# Patient Record
Sex: Female | Born: 1945 | Race: White | Hispanic: No | Marital: Single | Smoking: Former smoker
Health system: Southern US, Community
[De-identification: ages and names within clinical notes are randomized; demographics above are authoritative.]

## PROBLEM LIST (undated history)

## (undated) DIAGNOSIS — I519 Heart disease, unspecified: Secondary | ICD-10-CM

## (undated) DIAGNOSIS — Z9981 Dependence on supplemental oxygen: Secondary | ICD-10-CM

## (undated) DIAGNOSIS — I10 Essential (primary) hypertension: Secondary | ICD-10-CM

## (undated) DIAGNOSIS — I4891 Unspecified atrial fibrillation: Secondary | ICD-10-CM

## (undated) DIAGNOSIS — R42 Dizziness and giddiness: Secondary | ICD-10-CM

## (undated) DIAGNOSIS — R062 Wheezing: Secondary | ICD-10-CM

## (undated) DIAGNOSIS — R0601 Orthopnea: Secondary | ICD-10-CM

## (undated) DIAGNOSIS — F329 Major depressive disorder, single episode, unspecified: Secondary | ICD-10-CM

## (undated) DIAGNOSIS — E7800 Pure hypercholesterolemia, unspecified: Secondary | ICD-10-CM

## (undated) DIAGNOSIS — F419 Anxiety disorder, unspecified: Secondary | ICD-10-CM

## (undated) DIAGNOSIS — E039 Hypothyroidism, unspecified: Secondary | ICD-10-CM

## (undated) DIAGNOSIS — J449 Chronic obstructive pulmonary disease, unspecified: Secondary | ICD-10-CM

## (undated) DIAGNOSIS — K219 Gastro-esophageal reflux disease without esophagitis: Secondary | ICD-10-CM

## (undated) DIAGNOSIS — I639 Cerebral infarction, unspecified: Secondary | ICD-10-CM

## (undated) DIAGNOSIS — R05 Cough: Secondary | ICD-10-CM

## (undated) DIAGNOSIS — F32A Depression, unspecified: Secondary | ICD-10-CM

## (undated) DIAGNOSIS — I499 Cardiac arrhythmia, unspecified: Secondary | ICD-10-CM

## (undated) DIAGNOSIS — R059 Cough, unspecified: Secondary | ICD-10-CM

## (undated) HISTORY — PX: TONSILLECTOMY: SUR1361

## (undated) HISTORY — PX: KNEE SURGERY: SHX244

## (undated) HISTORY — DX: Anxiety disorder, unspecified: F41.9

## (undated) HISTORY — PX: ABDOMINAL SURGERY: SHX537

## (undated) HISTORY — PX: BRAIN SURGERY: SHX531

## (undated) HISTORY — DX: Heart disease, unspecified: I51.9

## (undated) HISTORY — DX: Pure hypercholesterolemia, unspecified: E78.00

## (undated) HISTORY — PX: AORTA SURGERY: SHX548

## (undated) HISTORY — PX: ANEURYSM COILING: SHX5349

---

## 2009-10-15 ENCOUNTER — Ambulatory Visit: Payer: Self-pay

## 2009-10-30 ENCOUNTER — Ambulatory Visit: Payer: Self-pay

## 2010-08-27 ENCOUNTER — Ambulatory Visit: Payer: Self-pay | Admitting: Gastroenterology

## 2012-01-27 ENCOUNTER — Ambulatory Visit: Payer: Self-pay | Admitting: Family Medicine

## 2013-01-27 ENCOUNTER — Ambulatory Visit: Payer: Self-pay | Admitting: Family Medicine

## 2013-09-22 ENCOUNTER — Emergency Department: Payer: Self-pay | Admitting: Emergency Medicine

## 2013-09-22 LAB — URINALYSIS, COMPLETE
Bacteria: NONE SEEN
Bilirubin,UR: NEGATIVE
Blood: NEGATIVE
Glucose,UR: NEGATIVE mg/dL (ref 0–75)
Ketone: NEGATIVE
Leukocyte Esterase: NEGATIVE
Nitrite: NEGATIVE
Ph: 6 (ref 4.5–8.0)
Protein: 30
RBC,UR: 1 /HPF (ref 0–5)
Specific Gravity: 1.019 (ref 1.003–1.030)
Squamous Epithelial: 1
WBC UR: 5 /HPF (ref 0–5)

## 2013-09-22 LAB — COMPREHENSIVE METABOLIC PANEL
Albumin: 3.3 g/dL — ABNORMAL LOW (ref 3.4–5.0)
Alkaline Phosphatase: 81 U/L
Anion Gap: 9 (ref 7–16)
BUN: 18 mg/dL (ref 7–18)
Bilirubin,Total: 1.2 mg/dL — ABNORMAL HIGH (ref 0.2–1.0)
Calcium, Total: 8.6 mg/dL (ref 8.5–10.1)
Chloride: 107 mmol/L (ref 98–107)
Co2: 22 mmol/L (ref 21–32)
Creatinine: 0.84 mg/dL (ref 0.60–1.30)
EGFR (African American): >60
EGFR (Non-African Amer.): >60
Glucose: 146 mg/dL — ABNORMAL HIGH (ref 65–99)
Osmolality: 280 (ref 275–301)
Potassium: 4.5 mmol/L (ref 3.5–5.1)
SGOT(AST): 42 U/L — ABNORMAL HIGH (ref 15–37)
SGPT (ALT): 31 U/L
Sodium: 138 mmol/L (ref 136–145)
Total Protein: 7.8 g/dL (ref 6.4–8.2)

## 2013-09-22 LAB — MAGNESIUM: Magnesium: 1.8 mg/dL

## 2013-09-22 LAB — CBC
HCT: 44.3 % (ref 35.0–47.0)
HGB: 14.1 g/dL (ref 12.0–16.0)
MCH: 27 pg (ref 26.0–34.0)
MCHC: 31.8 g/dL — ABNORMAL LOW (ref 32.0–36.0)
MCV: 85 fL (ref 80–100)
Platelet: 362 10*3/uL (ref 150–440)
RBC: 5.22 10*6/uL — ABNORMAL HIGH (ref 3.80–5.20)
RDW: 14.7 % — ABNORMAL HIGH (ref 11.5–14.5)
WBC: 19.9 10*3/uL — ABNORMAL HIGH (ref 3.6–11.0)

## 2013-09-22 LAB — PROTIME-INR
INR: 1.9
Prothrombin Time: 21.2 secs — ABNORMAL HIGH (ref 11.5–14.7)

## 2013-09-22 LAB — LIPASE, BLOOD: Lipase: 147 U/L (ref 73–393)

## 2013-09-22 LAB — TROPONIN I: Troponin-I: <0.02 ng/mL

## 2014-01-10 ENCOUNTER — Observation Stay: Payer: Self-pay | Admitting: Internal Medicine

## 2014-01-10 LAB — URINALYSIS, COMPLETE
Bacteria: NONE SEEN
Bilirubin,UR: NEGATIVE
Blood: NEGATIVE
Glucose,UR: NEGATIVE mg/dL (ref 0–75)
Ketone: NEGATIVE
Nitrite: NEGATIVE
Ph: 7 (ref 4.5–8.0)
Protein: NEGATIVE
RBC,UR: 1 /HPF (ref 0–5)
Specific Gravity: 1.006 (ref 1.003–1.030)
Squamous Epithelial: 1
WBC UR: 3 /HPF (ref 0–5)

## 2014-01-10 LAB — COMPREHENSIVE METABOLIC PANEL
Albumin: 3.1 g/dL — ABNORMAL LOW (ref 3.4–5.0)
Alkaline Phosphatase: 72 U/L
Anion Gap: 5 — ABNORMAL LOW (ref 7–16)
BUN: 13 mg/dL (ref 7–18)
Bilirubin,Total: 2.2 mg/dL — ABNORMAL HIGH (ref 0.2–1.0)
Calcium, Total: 8.3 mg/dL — ABNORMAL LOW (ref 8.5–10.1)
Chloride: 107 mmol/L (ref 98–107)
Co2: 30 mmol/L (ref 21–32)
Creatinine: 0.82 mg/dL (ref 0.60–1.30)
EGFR (African American): >60
EGFR (Non-African Amer.): >60
Glucose: 103 mg/dL — ABNORMAL HIGH (ref 65–99)
Osmolality: 283 (ref 275–301)
Potassium: 3.9 mmol/L (ref 3.5–5.1)
SGOT(AST): 30 U/L (ref 15–37)
SGPT (ALT): 23 U/L
Sodium: 142 mmol/L (ref 136–145)
Total Protein: 7.3 g/dL (ref 6.4–8.2)

## 2014-01-10 LAB — PROTIME-INR
INR: 2.6
Prothrombin Time: 27.2 secs — ABNORMAL HIGH (ref 11.5–14.7)

## 2014-01-10 LAB — CBC
HCT: 38 % (ref 35.0–47.0)
HGB: 11.7 g/dL — ABNORMAL LOW (ref 12.0–16.0)
MCH: 25.5 pg — ABNORMAL LOW (ref 26.0–34.0)
MCHC: 30.8 g/dL — ABNORMAL LOW (ref 32.0–36.0)
MCV: 83 fL (ref 80–100)
Platelet: 418 10*3/uL (ref 150–440)
RBC: 4.59 10*6/uL (ref 3.80–5.20)
RDW: 17.1 % — ABNORMAL HIGH (ref 11.5–14.5)
WBC: 10.4 10*3/uL (ref 3.6–11.0)

## 2014-01-10 LAB — TROPONIN I: Troponin-I: <0.02 ng/mL

## 2014-01-11 DIAGNOSIS — I340 Nonrheumatic mitral (valve) insufficiency: Secondary | ICD-10-CM | POA: Diagnosis not present

## 2014-01-11 LAB — PROTIME-INR
INR: 3
Prothrombin Time: 30 secs — ABNORMAL HIGH (ref 11.5–14.7)

## 2014-01-12 LAB — PROTIME-INR
INR: 3.3
Prothrombin Time: 32.3 secs — ABNORMAL HIGH (ref 11.5–14.7)

## 2014-01-12 LAB — URINE CULTURE

## 2014-01-13 LAB — PROTIME-INR
INR: 3
Prothrombin Time: 29.9 secs — ABNORMAL HIGH (ref 11.5–14.7)

## 2014-01-14 LAB — BASIC METABOLIC PANEL
Anion Gap: 9 (ref 7–16)
BUN: 33 mg/dL — ABNORMAL HIGH (ref 7–18)
Calcium, Total: 8.8 mg/dL (ref 8.5–10.1)
Chloride: 102 mmol/L (ref 98–107)
Co2: 27 mmol/L (ref 21–32)
Glucose: 214 mg/dL — ABNORMAL HIGH (ref 65–99)
Osmolality: 289 (ref 275–301)
Potassium: 5 mmol/L (ref 3.5–5.1)
Sodium: 138 mmol/L (ref 136–145)

## 2014-01-14 LAB — CREATININE, SERUM
Creatinine: 1.07 mg/dL (ref 0.60–1.30)
EGFR (African American): >60
EGFR (Non-African Amer.): 54 — ABNORMAL LOW

## 2014-01-14 LAB — HEMOGLOBIN: HGB: 12.3 g/dL (ref 12.0–16.0)

## 2014-01-29 ENCOUNTER — Inpatient Hospital Stay (HOSPITAL_COMMUNITY): Payer: Medicare HMO

## 2014-01-29 ENCOUNTER — Encounter (HOSPITAL_COMMUNITY): Payer: Self-pay | Admitting: Emergency Medicine

## 2014-01-29 ENCOUNTER — Emergency Department (HOSPITAL_COMMUNITY): Payer: Medicare HMO

## 2014-01-29 ENCOUNTER — Inpatient Hospital Stay (HOSPITAL_COMMUNITY)
Admission: EM | Admit: 2014-01-29 | Discharge: 2014-03-05 | DRG: 003 | Disposition: A | Discharge status: Skilled Nursing Facility | Payer: Medicare HMO | Attending: Neurology | Admitting: Neurology

## 2014-01-29 ENCOUNTER — Encounter (HOSPITAL_COMMUNITY)
Admission: EM | Disposition: A | Discharge status: Skilled Nursing Facility | Payer: Self-pay | Source: Home / Self Care | Attending: Neurology

## 2014-01-29 ENCOUNTER — Inpatient Hospital Stay (HOSPITAL_COMMUNITY): Payer: Medicare HMO | Admitting: Certified Registered Nurse Anesthetist

## 2014-01-29 DIAGNOSIS — R414 Neurologic neglect syndrome: Secondary | ICD-10-CM | POA: Diagnosis present

## 2014-01-29 DIAGNOSIS — J9602 Acute respiratory failure with hypercapnia: Secondary | ICD-10-CM | POA: Diagnosis not present

## 2014-01-29 DIAGNOSIS — Z7982 Long term (current) use of aspirin: Secondary | ICD-10-CM | POA: Diagnosis not present

## 2014-01-29 DIAGNOSIS — K289 Gastrojejunal ulcer, unspecified as acute or chronic, without hemorrhage or perforation: Secondary | ICD-10-CM | POA: Diagnosis present

## 2014-01-29 DIAGNOSIS — K2901 Acute gastritis with bleeding: Secondary | ICD-10-CM

## 2014-01-29 DIAGNOSIS — R0602 Shortness of breath: Secondary | ICD-10-CM

## 2014-01-29 DIAGNOSIS — Z930 Tracheostomy status: Secondary | ICD-10-CM | POA: Diagnosis not present

## 2014-01-29 DIAGNOSIS — I6320 Cerebral infarction due to unspecified occlusion or stenosis of unspecified precerebral arteries: Secondary | ICD-10-CM

## 2014-01-29 DIAGNOSIS — E876 Hypokalemia: Secondary | ICD-10-CM | POA: Diagnosis not present

## 2014-01-29 DIAGNOSIS — K5730 Diverticulosis of large intestine without perforation or abscess without bleeding: Secondary | ICD-10-CM | POA: Diagnosis present

## 2014-01-29 DIAGNOSIS — J9600 Acute respiratory failure, unspecified whether with hypoxia or hypercapnia: Secondary | ICD-10-CM | POA: Diagnosis present

## 2014-01-29 DIAGNOSIS — J449 Chronic obstructive pulmonary disease, unspecified: Secondary | ICD-10-CM

## 2014-01-29 DIAGNOSIS — J441 Chronic obstructive pulmonary disease with (acute) exacerbation: Secondary | ICD-10-CM | POA: Diagnosis not present

## 2014-01-29 DIAGNOSIS — I63512 Cerebral infarction due to unspecified occlusion or stenosis of left middle cerebral artery: Secondary | ICD-10-CM | POA: Diagnosis present

## 2014-01-29 DIAGNOSIS — I69390 Apraxia following cerebral infarction: Secondary | ICD-10-CM | POA: Diagnosis not present

## 2014-01-29 DIAGNOSIS — I959 Hypotension, unspecified: Secondary | ICD-10-CM | POA: Diagnosis present

## 2014-01-29 DIAGNOSIS — I639 Cerebral infarction, unspecified: Secondary | ICD-10-CM

## 2014-01-29 DIAGNOSIS — R739 Hyperglycemia, unspecified: Secondary | ICD-10-CM | POA: Diagnosis present

## 2014-01-29 DIAGNOSIS — I4891 Unspecified atrial fibrillation: Secondary | ICD-10-CM | POA: Diagnosis not present

## 2014-01-29 DIAGNOSIS — R1314 Dysphagia, pharyngoesophageal phase: Secondary | ICD-10-CM | POA: Diagnosis not present

## 2014-01-29 DIAGNOSIS — I6200 Nontraumatic subdural hemorrhage, unspecified: Secondary | ICD-10-CM | POA: Diagnosis present

## 2014-01-29 DIAGNOSIS — K317 Polyp of stomach and duodenum: Secondary | ICD-10-CM | POA: Diagnosis present

## 2014-01-29 DIAGNOSIS — K648 Other hemorrhoids: Secondary | ICD-10-CM | POA: Diagnosis present

## 2014-01-29 DIAGNOSIS — I10 Essential (primary) hypertension: Secondary | ICD-10-CM | POA: Diagnosis present

## 2014-01-29 DIAGNOSIS — I480 Paroxysmal atrial fibrillation: Secondary | ICD-10-CM | POA: Diagnosis present

## 2014-01-29 DIAGNOSIS — J9610 Chronic respiratory failure, unspecified whether with hypoxia or hypercapnia: Secondary | ICD-10-CM

## 2014-01-29 DIAGNOSIS — R059 Cough, unspecified: Secondary | ICD-10-CM

## 2014-01-29 DIAGNOSIS — T17990A Other foreign object in respiratory tract, part unspecified in causing asphyxiation, initial encounter: Secondary | ICD-10-CM | POA: Diagnosis not present

## 2014-01-29 DIAGNOSIS — R4702 Dysphasia: Secondary | ICD-10-CM | POA: Diagnosis present

## 2014-01-29 DIAGNOSIS — F419 Anxiety disorder, unspecified: Secondary | ICD-10-CM | POA: Diagnosis not present

## 2014-01-29 DIAGNOSIS — K922 Gastrointestinal hemorrhage, unspecified: Secondary | ICD-10-CM

## 2014-01-29 DIAGNOSIS — E039 Hypothyroidism, unspecified: Secondary | ICD-10-CM | POA: Diagnosis present

## 2014-01-29 DIAGNOSIS — R062 Wheezing: Secondary | ICD-10-CM | POA: Diagnosis present

## 2014-01-29 DIAGNOSIS — K31811 Angiodysplasia of stomach and duodenum with bleeding: Secondary | ICD-10-CM | POA: Diagnosis present

## 2014-01-29 DIAGNOSIS — G8194 Hemiplegia, unspecified affecting left nondominant side: Secondary | ICD-10-CM | POA: Diagnosis present

## 2014-01-29 DIAGNOSIS — E669 Obesity, unspecified: Secondary | ICD-10-CM | POA: Diagnosis present

## 2014-01-29 DIAGNOSIS — Z4682 Encounter for fitting and adjustment of non-vascular catheter: Secondary | ICD-10-CM

## 2014-01-29 DIAGNOSIS — J9621 Acute and chronic respiratory failure with hypoxia: Secondary | ICD-10-CM | POA: Diagnosis not present

## 2014-01-29 DIAGNOSIS — J418 Mixed simple and mucopurulent chronic bronchitis: Secondary | ICD-10-CM | POA: Diagnosis present

## 2014-01-29 DIAGNOSIS — I4892 Unspecified atrial flutter: Secondary | ICD-10-CM | POA: Diagnosis present

## 2014-01-29 DIAGNOSIS — R4020 Unspecified coma: Secondary | ICD-10-CM | POA: Diagnosis not present

## 2014-01-29 DIAGNOSIS — R791 Abnormal coagulation profile: Secondary | ICD-10-CM | POA: Diagnosis present

## 2014-01-29 DIAGNOSIS — K5900 Constipation, unspecified: Secondary | ICD-10-CM | POA: Diagnosis not present

## 2014-01-29 DIAGNOSIS — Z8679 Personal history of other diseases of the circulatory system: Secondary | ICD-10-CM | POA: Diagnosis not present

## 2014-01-29 DIAGNOSIS — J9690 Respiratory failure, unspecified, unspecified whether with hypoxia or hypercapnia: Secondary | ICD-10-CM

## 2014-01-29 DIAGNOSIS — N179 Acute kidney failure, unspecified: Secondary | ICD-10-CM | POA: Diagnosis not present

## 2014-01-29 DIAGNOSIS — B370 Candidal stomatitis: Secondary | ICD-10-CM

## 2014-01-29 DIAGNOSIS — J9811 Atelectasis: Secondary | ICD-10-CM

## 2014-01-29 DIAGNOSIS — K5520 Angiodysplasia of colon without hemorrhage: Secondary | ICD-10-CM

## 2014-01-29 DIAGNOSIS — K921 Melena: Secondary | ICD-10-CM

## 2014-01-29 DIAGNOSIS — T380X5A Adverse effect of glucocorticoids and synthetic analogues, initial encounter: Secondary | ICD-10-CM | POA: Diagnosis not present

## 2014-01-29 DIAGNOSIS — Z8673 Personal history of transient ischemic attack (TIA), and cerebral infarction without residual deficits: Secondary | ICD-10-CM

## 2014-01-29 DIAGNOSIS — Z6830 Body mass index (BMI) 30.0-30.9, adult: Secondary | ICD-10-CM | POA: Diagnosis not present

## 2014-01-29 DIAGNOSIS — J9383 Other pneumothorax: Secondary | ICD-10-CM | POA: Diagnosis not present

## 2014-01-29 DIAGNOSIS — F329 Major depressive disorder, single episode, unspecified: Secondary | ICD-10-CM | POA: Diagnosis present

## 2014-01-29 DIAGNOSIS — J939 Pneumothorax, unspecified: Secondary | ICD-10-CM

## 2014-01-29 DIAGNOSIS — W07XXXA Fall from chair, initial encounter: Secondary | ICD-10-CM | POA: Diagnosis present

## 2014-01-29 DIAGNOSIS — J209 Acute bronchitis, unspecified: Secondary | ICD-10-CM | POA: Diagnosis not present

## 2014-01-29 DIAGNOSIS — Z7901 Long term (current) use of anticoagulants: Secondary | ICD-10-CM

## 2014-01-29 DIAGNOSIS — I4430 Unspecified atrioventricular block: Secondary | ICD-10-CM | POA: Diagnosis not present

## 2014-01-29 DIAGNOSIS — J00 Acute nasopharyngitis [common cold]: Secondary | ICD-10-CM | POA: Diagnosis not present

## 2014-01-29 DIAGNOSIS — F05 Delirium due to known physiological condition: Secondary | ICD-10-CM | POA: Diagnosis not present

## 2014-01-29 DIAGNOSIS — I63511 Cerebral infarction due to unspecified occlusion or stenosis of right middle cerebral artery: Secondary | ICD-10-CM | POA: Diagnosis not present

## 2014-01-29 DIAGNOSIS — Z4659 Encounter for fitting and adjustment of other gastrointestinal appliance and device: Secondary | ICD-10-CM

## 2014-01-29 DIAGNOSIS — D62 Acute posthemorrhagic anemia: Secondary | ICD-10-CM

## 2014-01-29 DIAGNOSIS — J690 Pneumonitis due to inhalation of food and vomit: Secondary | ICD-10-CM

## 2014-01-29 DIAGNOSIS — Z7951 Long term (current) use of inhaled steroids: Secondary | ICD-10-CM

## 2014-01-29 DIAGNOSIS — R05 Cough: Secondary | ICD-10-CM

## 2014-01-29 DIAGNOSIS — D123 Benign neoplasm of transverse colon: Secondary | ICD-10-CM | POA: Diagnosis not present

## 2014-01-29 DIAGNOSIS — I369 Nonrheumatic tricuspid valve disorder, unspecified: Secondary | ICD-10-CM

## 2014-01-29 DIAGNOSIS — Z87891 Personal history of nicotine dependence: Secondary | ICD-10-CM

## 2014-01-29 DIAGNOSIS — Z9981 Dependence on supplemental oxygen: Secondary | ICD-10-CM

## 2014-01-29 DIAGNOSIS — E785 Hyperlipidemia, unspecified: Secondary | ICD-10-CM | POA: Insufficient documentation

## 2014-01-29 DIAGNOSIS — G8190 Hemiplegia, unspecified affecting unspecified side: Secondary | ICD-10-CM | POA: Diagnosis not present

## 2014-01-29 DIAGNOSIS — I63411 Cerebral infarction due to embolism of right middle cerebral artery: Secondary | ICD-10-CM | POA: Diagnosis not present

## 2014-01-29 DIAGNOSIS — Z9911 Dependence on respirator [ventilator] status: Secondary | ICD-10-CM | POA: Diagnosis not present

## 2014-01-29 DIAGNOSIS — J440 Chronic obstructive pulmonary disease with acute lower respiratory infection: Secondary | ICD-10-CM | POA: Diagnosis present

## 2014-01-29 DIAGNOSIS — J9601 Acute respiratory failure with hypoxia: Secondary | ICD-10-CM | POA: Diagnosis not present

## 2014-01-29 DIAGNOSIS — J189 Pneumonia, unspecified organism: Secondary | ICD-10-CM | POA: Diagnosis not present

## 2014-01-29 DIAGNOSIS — J9620 Acute and chronic respiratory failure, unspecified whether with hypoxia or hypercapnia: Secondary | ICD-10-CM | POA: Diagnosis not present

## 2014-01-29 DIAGNOSIS — D126 Benign neoplasm of colon, unspecified: Secondary | ICD-10-CM

## 2014-01-29 DIAGNOSIS — Q2733 Arteriovenous malformation of digestive system vessel: Secondary | ICD-10-CM | POA: Diagnosis not present

## 2014-01-29 HISTORY — DX: Unspecified atrial fibrillation: I48.91

## 2014-01-29 HISTORY — DX: Essential (primary) hypertension: I10

## 2014-01-29 HISTORY — PX: RADIOLOGY WITH ANESTHESIA: SHX6223

## 2014-01-29 HISTORY — DX: Cerebral infarction, unspecified: I63.9

## 2014-01-29 HISTORY — DX: Chronic obstructive pulmonary disease, unspecified: J44.9

## 2014-01-29 LAB — URINALYSIS, ROUTINE W REFLEX MICROSCOPIC
Bilirubin Urine: NEGATIVE
Glucose, UA: NEGATIVE mg/dL
Hgb urine dipstick: NEGATIVE
Ketones, ur: NEGATIVE mg/dL
Leukocytes, UA: NEGATIVE
Nitrite: NEGATIVE
Protein, ur: 30 mg/dL — AB
Specific Gravity, Urine: 1.042 — ABNORMAL HIGH (ref 1.005–1.030)
Urobilinogen, UA: 1 mg/dL (ref 0.0–1.0)
pH: 5.5 (ref 5.0–8.0)

## 2014-01-29 LAB — ETHANOL: Alcohol, Ethyl (B): <11 mg/dL (ref 0–11)

## 2014-01-29 LAB — I-STAT CHEM 8, ED
BUN: 17 mg/dL (ref 6–23)
Calcium, Ion: 1.11 mmol/L — ABNORMAL LOW (ref 1.13–1.30)
Chloride: 101 mEq/L (ref 96–112)
Creatinine, Ser: 0.8 mg/dL (ref 0.50–1.10)
Glucose, Bld: 131 mg/dL — ABNORMAL HIGH (ref 70–99)
HCT: 43 % (ref 36.0–46.0)
Hemoglobin: 14.6 g/dL (ref 12.0–15.0)
Potassium: 3.9 mEq/L (ref 3.7–5.3)
Sodium: 139 mEq/L (ref 137–147)
TCO2: 25 mmol/L (ref 0–100)

## 2014-01-29 LAB — COMPREHENSIVE METABOLIC PANEL
ALT: 17 U/L (ref 0–35)
AST: 19 U/L (ref 0–37)
Albumin: 3.2 g/dL — ABNORMAL LOW (ref 3.5–5.2)
Alkaline Phosphatase: 61 U/L (ref 39–117)
Anion gap: 16 — ABNORMAL HIGH (ref 5–15)
BUN: 15 mg/dL (ref 6–23)
CO2: 24 mEq/L (ref 19–32)
Calcium: 9.2 mg/dL (ref 8.4–10.5)
Chloride: 100 mEq/L (ref 96–112)
Creatinine, Ser: 0.76 mg/dL (ref 0.50–1.10)
GFR calc Af Amer: >90 mL/min (ref >90)
GFR calc non Af Amer: 84 mL/min — ABNORMAL LOW (ref >90)
Glucose, Bld: 128 mg/dL — ABNORMAL HIGH (ref 70–99)
Potassium: 4.2 mEq/L (ref 3.7–5.3)
Sodium: 140 mEq/L (ref 137–147)
Total Bilirubin: 1.7 mg/dL — ABNORMAL HIGH (ref 0.3–1.2)
Total Protein: 7 g/dL (ref 6.0–8.3)

## 2014-01-29 LAB — CBC
HCT: 39 % (ref 36.0–46.0)
Hemoglobin: 11.8 g/dL — ABNORMAL LOW (ref 12.0–15.0)
MCH: 25 pg — ABNORMAL LOW (ref 26.0–34.0)
MCHC: 30.3 g/dL (ref 30.0–36.0)
MCV: 82.6 fL (ref 78.0–100.0)
Platelets: 195 10*3/uL (ref 150–400)
RBC: 4.72 MIL/uL (ref 3.87–5.11)
RDW: 19.1 % — ABNORMAL HIGH (ref 11.5–15.5)
WBC: 13.9 10*3/uL — ABNORMAL HIGH (ref 4.0–10.5)

## 2014-01-29 LAB — BLOOD GAS, ARTERIAL
Acid-base deficit: 0.9 mmol/L (ref 0.0–2.0)
Bicarbonate: 24.9 mEq/L — ABNORMAL HIGH (ref 20.0–24.0)
Drawn by: 281201
FIO2: 0.7 %
MECHVT: 400 mL
O2 Saturation: 91.4 %
PEEP: 5 cmH2O
Patient temperature: 98.6
RATE: 14 resp/min
TCO2: 26.6 mmol/L (ref 0–100)
pCO2 arterial: 54.5 mmHg — ABNORMAL HIGH (ref 35.0–45.0)
pH, Arterial: 7.282 — ABNORMAL LOW (ref 7.350–7.450)
pO2, Arterial: 67.7 mmHg — ABNORMAL LOW (ref 80.0–100.0)

## 2014-01-29 LAB — RAPID URINE DRUG SCREEN, HOSP PERFORMED
Amphetamines: NOT DETECTED
Barbiturates: NOT DETECTED
Benzodiazepines: NOT DETECTED
Cocaine: NOT DETECTED
Opiates: NOT DETECTED
Tetrahydrocannabinol: NOT DETECTED

## 2014-01-29 LAB — DIFFERENTIAL
Basophils Absolute: 0.1 10*3/uL (ref 0.0–0.1)
Basophils Relative: 1 % (ref 0–1)
Eosinophils Absolute: 0.2 10*3/uL (ref 0.0–0.7)
Eosinophils Relative: 2 % (ref 0–5)
Lymphocytes Relative: 15 % (ref 12–46)
Lymphs Abs: 2.1 10*3/uL (ref 0.7–4.0)
Monocytes Absolute: 0.9 10*3/uL (ref 0.1–1.0)
Monocytes Relative: 6 % (ref 3–12)
Neutro Abs: 10.7 10*3/uL — ABNORMAL HIGH (ref 1.7–7.7)
Neutrophils Relative %: 76 % (ref 43–77)

## 2014-01-29 LAB — URINE MICROSCOPIC-ADD ON

## 2014-01-29 LAB — TSH: TSH: 4.49 u[IU]/mL (ref 0.350–4.500)

## 2014-01-29 LAB — I-STAT TROPONIN, ED: Troponin i, poc: 0 ng/mL (ref 0.00–0.08)

## 2014-01-29 LAB — TRIGLYCERIDES: Triglycerides: 118 mg/dL (ref <150)

## 2014-01-29 LAB — POCT ACTIVATED CLOTTING TIME: Activated Clotting Time: 185 seconds

## 2014-01-29 LAB — MRSA PCR SCREENING: MRSA by PCR: NEGATIVE

## 2014-01-29 LAB — APTT: aPTT: 33 seconds (ref 24–37)

## 2014-01-29 LAB — PROTIME-INR
INR: 1.18 (ref 0.00–1.49)
Prothrombin Time: 15.1 seconds (ref 11.6–15.2)

## 2014-01-29 SURGERY — RADIOLOGY WITH ANESTHESIA
Anesthesia: Monitor Anesthesia Care

## 2014-01-29 MED ORDER — LEVALBUTEROL HCL 0.63 MG/3ML IN NEBU
0.6300 mg | INHALATION_SOLUTION | Freq: Four times a day (QID) | RESPIRATORY_TRACT | Status: DC
  Administered 2014-01-29: 0.63 mg via RESPIRATORY_TRACT
  Filled 2014-01-29: qty 3

## 2014-01-29 MED ORDER — FENTANYL CITRATE 0.05 MG/ML IJ SOLN
50.0000 ug | INTRAMUSCULAR | Status: DC | PRN
  Administered 2014-01-29 – 2014-01-31 (×7): 50 ug via INTRAVENOUS
  Filled 2014-01-29 (×5): qty 2

## 2014-01-29 MED ORDER — IOHEXOL 300 MG/ML  SOLN
125.0000 mL | Freq: Once | INTRAMUSCULAR | Status: AC | PRN
  Administered 2014-01-29: 125 mL via INTRAVENOUS

## 2014-01-29 MED ORDER — ACETAMINOPHEN 500 MG PO TABS: 1000.0000 mg | ORAL_TABLET | Freq: Four times a day (QID) | ORAL | Status: DC | PRN

## 2014-01-29 MED ORDER — ASPIRIN 300 MG RE SUPP: 300.0000 mg | Freq: Every day | RECTAL | Status: DC

## 2014-01-29 MED ORDER — LIDOCAINE HCL 1 % IJ SOLN
INTRAMUSCULAR | Status: AC
Start: 2014-01-29 — End: 2014-01-29
  Filled 2014-01-29: qty 20

## 2014-01-29 MED ORDER — GLYCOPYRROLATE 0.2 MG/ML IJ SOLN
INTRAMUSCULAR | Status: DC | PRN
  Administered 2014-01-29 (×2): 0.2 mg via INTRAVENOUS

## 2014-01-29 MED ORDER — PROPOFOL 10 MG/ML IV EMUL
INTRAVENOUS | Status: AC
  Administered 2014-01-29: 15 ug/kg/min via INTRAVENOUS
  Filled 2014-01-29: qty 100

## 2014-01-29 MED ORDER — NITROGLYCERIN 1 MG/10 ML FOR IR/CATH LAB
INTRA_ARTERIAL | Status: AC
Start: 2014-01-29 — End: 2014-01-29
  Filled 2014-01-29: qty 10

## 2014-01-29 MED ORDER — PHENYLEPHRINE HCL 10 MG/ML IJ SOLN
10.0000 mg | INTRAVENOUS | Status: DC | PRN
  Administered 2014-01-29: 40 ug/min via INTRAVENOUS

## 2014-01-29 MED ORDER — ASPIRIN EC 325 MG PO TBEC: 325.0000 mg | DELAYED_RELEASE_TABLET | Freq: Every day | ORAL | Status: DC

## 2014-01-29 MED ORDER — ONDANSETRON HCL 4 MG/2ML IJ SOLN: 4.0000 mg | Freq: Four times a day (QID) | INTRAMUSCULAR | Status: DC | PRN

## 2014-01-29 MED ORDER — LEVOTHYROXINE SODIUM 100 MCG IV SOLR
75.0000 ug | Freq: Every day | INTRAVENOUS | Status: DC
  Administered 2014-01-29 – 2014-01-31 (×2): 75 ug via INTRAVENOUS
  Filled 2014-01-29 (×4): qty 5

## 2014-01-29 MED ORDER — HEPARIN SODIUM (PORCINE) 1000 UNIT/ML IJ SOLN
INTRAMUSCULAR | Status: DC | PRN
  Administered 2014-01-29: 2000 [IU] via INTRAVENOUS
  Administered 2014-01-29: 1000 [IU] via INTRAVENOUS

## 2014-01-29 MED ORDER — NITROGLYCERIN 1 MG/10 ML FOR IR/CATH LAB
INTRA_ARTERIAL | Status: AC | PRN
  Administered 2014-01-29: 125 ug via INTRA_ARTERIAL

## 2014-01-29 MED ORDER — EPTIFIBATIDE 2 MG/ML IV SOLN
INTRAVENOUS | Status: AC | PRN
  Administered 2014-01-29: 10 mg

## 2014-01-29 MED ORDER — ACETAMINOPHEN 650 MG RE SUPP: 650.0000 mg | Freq: Four times a day (QID) | RECTAL | Status: DC | PRN

## 2014-01-29 MED ORDER — LIDOCAINE HCL (CARDIAC) 20 MG/ML IV SOLN
INTRAVENOUS | Status: DC | PRN
  Administered 2014-01-29: 80 mg via INTRAVENOUS

## 2014-01-29 MED ORDER — CETYLPYRIDINIUM CHLORIDE 0.05 % MT LIQD
7.0000 mL | Freq: Four times a day (QID) | OROMUCOSAL | Status: DC
  Administered 2014-01-29 – 2014-01-31 (×6): 7 mL via OROMUCOSAL

## 2014-01-29 MED ORDER — PANTOPRAZOLE SODIUM 40 MG IV SOLR
40.0000 mg | INTRAVENOUS | Status: DC
  Administered 2014-01-29 – 2014-01-30 (×2): 40 mg via INTRAVENOUS
  Filled 2014-01-29 (×3): qty 40

## 2014-01-29 MED ORDER — EPTIFIBATIDE 2 MG/ML IV SOLN
INTRAVENOUS | Status: AC
  Filled 2014-01-29: qty 10

## 2014-01-29 MED ORDER — ONDANSETRON HCL 4 MG/2ML IJ SOLN
INTRAMUSCULAR | Status: DC | PRN
  Administered 2014-01-29: 4 mg via INTRAVENOUS

## 2014-01-29 MED ORDER — PROPOFOL 10 MG/ML IV BOLUS
INTRAVENOUS | Status: DC | PRN
  Administered 2014-01-29: 200 mg via INTRAVENOUS

## 2014-01-29 MED ORDER — FENTANYL CITRATE 0.05 MG/ML IJ SOLN
INTRAMUSCULAR | Status: DC | PRN
Start: 2014-01-29 — End: 2014-01-29
  Administered 2014-01-29: 100 ug via INTRAVENOUS

## 2014-01-29 MED ORDER — LACTATED RINGERS IV SOLN
INTRAVENOUS | Status: DC | PRN
  Administered 2014-01-29 (×2): via INTRAVENOUS

## 2014-01-29 MED ORDER — SUCCINYLCHOLINE CHLORIDE 20 MG/ML IJ SOLN
INTRAMUSCULAR | Status: DC | PRN
  Administered 2014-01-29: 100 mg via INTRAVENOUS

## 2014-01-29 MED ORDER — ROCURONIUM BROMIDE 100 MG/10ML IV SOLN
INTRAVENOUS | Status: DC | PRN
  Administered 2014-01-29 (×2): 30 mg via INTRAVENOUS
  Administered 2014-01-29 (×2): 20 mg via INTRAVENOUS

## 2014-01-29 MED ORDER — LEVALBUTEROL HCL 0.63 MG/3ML IN NEBU: 0.6300 mg | INHALATION_SOLUTION | RESPIRATORY_TRACT | Status: DC | PRN

## 2014-01-29 MED ORDER — DILTIAZEM HCL 100 MG IV SOLR
5.0000 mg/h | INTRAVENOUS | Status: DC
  Administered 2014-01-29 – 2014-01-30 (×2): 5 mg/h via INTRAVENOUS
  Filled 2014-01-29 (×2): qty 100

## 2014-01-29 MED ORDER — SODIUM CHLORIDE 0.9 % IV SOLN
INTRAVENOUS | Status: DC
  Administered 2014-01-29 – 2014-01-30 (×2): via INTRAVENOUS

## 2014-01-29 MED ORDER — IPRATROPIUM BROMIDE 0.02 % IN SOLN
0.5000 mg | Freq: Four times a day (QID) | RESPIRATORY_TRACT | Status: DC
  Administered 2014-01-29 – 2014-01-30 (×6): 0.5 mg via RESPIRATORY_TRACT
  Filled 2014-01-29 (×6): qty 2.5

## 2014-01-29 MED ORDER — CEFAZOLIN SODIUM-DEXTROSE 2-3 GM-% IV SOLR
INTRAVENOUS | Status: AC
Start: 2014-01-29 — End: 2014-01-29
  Administered 2014-01-29: 2 g via INTRAVENOUS
  Filled 2014-01-29: qty 50

## 2014-01-29 MED ORDER — METOPROLOL TARTRATE 1 MG/ML IV SOLN: 2.5000 mg | INTRAVENOUS | Status: DC | PRN

## 2014-01-29 MED ORDER — CHLORHEXIDINE GLUCONATE 0.12 % MT SOLN
15.0000 mL | Freq: Two times a day (BID) | OROMUCOSAL | Status: DC
  Administered 2014-01-29 – 2014-01-31 (×6): 15 mL via OROMUCOSAL
  Filled 2014-01-29 (×5): qty 15

## 2014-01-29 MED ORDER — IOHEXOL 350 MG/ML SOLN
50.0000 mL | Freq: Once | INTRAVENOUS | Status: AC | PRN
  Administered 2014-01-29: 40 mL via INTRAVENOUS

## 2014-01-29 MED ORDER — DILTIAZEM LOAD VIA INFUSION
10.0000 mg | Freq: Once | INTRAVENOUS | Status: AC
  Administered 2014-01-29: 10 mg via INTRAVENOUS
  Filled 2014-01-29: qty 10

## 2014-01-29 MED ORDER — FENTANYL CITRATE 0.05 MG/ML IJ SOLN
INTRAMUSCULAR | Status: AC
  Administered 2014-01-29: 50 ug via INTRAVENOUS
  Filled 2014-01-29: qty 2

## 2014-01-29 MED ORDER — PHENYLEPHRINE HCL 10 MG/ML IJ SOLN
INTRAMUSCULAR | Status: DC | PRN
  Administered 2014-01-29 (×5): 80 ug via INTRAVENOUS

## 2014-01-29 MED ORDER — NICARDIPINE HCL IN NACL 20-0.86 MG/200ML-% IV SOLN
5.0000 mg/h | INTRAVENOUS | Status: DC
  Administered 2014-01-29: 5 mg/h via INTRAVENOUS
  Filled 2014-01-29: qty 200

## 2014-01-29 MED ORDER — PROPOFOL 10 MG/ML IV EMUL
0.0000 ug/kg/min | INTRAVENOUS | Status: DC
  Administered 2014-01-29: 15 ug/kg/min via INTRAVENOUS
  Administered 2014-01-30: 10 ug/kg/min via INTRAVENOUS
  Filled 2014-01-29: qty 100

## 2014-01-29 MED ORDER — STROKE: EARLY STAGES OF RECOVERY BOOK
Freq: Once | Status: AC
  Administered 2014-01-29: 12:00:00
  Filled 2014-01-29: qty 1

## 2014-01-29 MED ORDER — MIDAZOLAM HCL 5 MG/5ML IJ SOLN
INTRAMUSCULAR | Status: DC | PRN
  Administered 2014-01-29: 2 mg via INTRAVENOUS

## 2014-01-29 MED ORDER — LEVALBUTEROL HCL 0.63 MG/3ML IN NEBU
0.6300 mg | INHALATION_SOLUTION | Freq: Four times a day (QID) | RESPIRATORY_TRACT | Status: DC
  Administered 2014-01-29 – 2014-01-30 (×6): 0.63 mg via RESPIRATORY_TRACT
  Filled 2014-01-29 (×10): qty 3

## 2014-01-29 MED ORDER — METOPROLOL TARTRATE 1 MG/ML IV SOLN
2.5000 mg | INTRAVENOUS | Status: DC | PRN
  Administered 2014-01-30 – 2014-02-01 (×3): 5 mg via INTRAVENOUS
  Administered 2014-02-03: 2.5 mg via INTRAVENOUS
  Administered 2014-02-03: 5 mg via INTRAVENOUS
  Administered 2014-03-04: 2.5 mg via INTRAVENOUS
  Filled 2014-01-29 (×6): qty 5

## 2014-01-29 NOTE — Sedation Documentation (Signed)
Pt transported to neuro ICU via stretcher with CRNA, RN and RT.  Bedside report given to [facility], RN.  Right groin C/D/I with no hematoma or bleeding noted at this time.

## 2014-01-29 NOTE — Procedures (Signed)
S/P Bilateral common carotid arteriograms ,followed by complete revascularization of RT MCA M1 occlusion usingX2 passes with the Solitaire FR 86mm x 48mm retrieval device  And 10 mg of superselective intracranial IA Integrelin   TICI 3 flow reesablished

## 2014-01-29 NOTE — Progress Notes (Signed)
9French RFA sheath removed and 8fr exoseal inserted per Summer Bradley RT and manual pressure held at site at 1540. 9 [place] sheath has multiple Kinks noted on removal.  Hemostasis achieved at site and sterile dressing applied to site.

## 2014-01-29 NOTE — ED Notes (Signed)
Pt going to IR. Report given.

## 2014-01-29 NOTE — Sedation Documentation (Signed)
Family updated as to patient's status.

## 2014-01-29 NOTE — Anesthesia Postprocedure Evaluation (Signed)
  Anesthesia Post-op Note  Patient: [name]  Procedure(s) Performed: Procedure(s): RADIOLOGY WITH ANESTHESIA (N/A)  Patient Location: NICU  Anesthesia Type:General  Level of Consciousness: sedated and Patient remains intubated per anesthesia plan  Airway and Oxygen Therapy: Patient remains intubated per anesthesia plan and Patient placed on Ventilator (see vital sign flow sheet for setting)  Post-op Pain: none  Post-op Assessment: Post-op Vital signs reviewed, Patient's Cardiovascular Status Stable, Respiratory Function Stable, Patent Airway, No signs of Nausea or vomiting and Pain level controlled  Post-op Vital Signs: stable  Last Vitals:  Filed Vitals:   01/29/14 1125  BP: 98/74  Pulse: 120  Resp: 18    Complications: No apparent anesthesia complications

## 2014-01-29 NOTE — H&P (Signed)
Neurology Consultation Reason for Consult: Left-sided weakness Referring Physician: Stark Jock, D  CC: Left-sided weakness  History is obtained from: Patient  HPI: Summer Bradley is a 68 y.o. female who got up around 4:30 AM to go to the bathroom and then sat down in her chair. She is not certain if her left arm was working at that time or not. She then fell out of her chair around 5 AM and that time noticed that her left arm wasn't working. She did not realize that anything was wrong until she fell out of her chair.  She was in her normal state of health ongoing to bed the evening prior.  In the ER, a CT was obtained which shows evidence of a previous stroke, and a CT perfusion scan was obtained which shows a significant mismatch demonstrating an area of penumbra, though there is an smaller area of likely infarct core as well..  LKW: 8 PM 12/13 tpa given?: no, outside of window, question of subdural hematoma    ROS: A 14 point ROS was performed and is negative except as noted in the HPI.   Past Medical History  Diagnosis Date  . Hypertension   . Stroke   . A-fib   . COPD (chronic obstructive pulmonary disease)    abdominal aortic aneurysm repair  Family History: No history of similar  Social History: Tob: Former smoker  Exam: Current vital signs: Pulse 41  Resp 26  SpO2 94% Vital signs in last 24 hours: Pulse Rate:  [41-135] 41 (12/14 0700) Resp:  [26-31] 26 (12/14 0700) SpO2:  [94 %-97 %] 94 % (12/14 0700)  Physical Exam  Constitutional: Appears well-developed and well-nourished.  Psych: Affect appropriate to situation Eyes: No scleral injection HENT: No OP obstrucion Head: Normocephalic.  Cardiovascular: Normal rate and regular rhythm.  Respiratory: Effort normal and breath sounds normal to anterior ascultation GI: Soft.  There is no tenderness. She has previous incision for abdominal aortic aneurysm repair. Skin: WDI  Neuro: Mental Status: Patient is awake, alert,  oriented to person, place, month, year, and situation. Patient is able to give a clear and coherent history. No signs of aphasia  She has a significant left hemi-neglect Cranial Nerves: II: Visual Fields complete left hemianopia Pupils are equal, round, and reactive to light.   III,IV, VI: Right gaze deviation  V: Facial sensation is decreased on the left VII: Facial movement is left-sided weakness VIII: hearing is intact to voice X: Uvula elevates symmetrically XI: Shoulder shrug is symmetric. XII: tongue is midline without atrophy or fasciculations.  Motor: Tone is normal. Bulk is normal. 5/5 strength was present in the right arm and leg, she has no movement of the left arm and 3/5 weakness of the left leg Sensory: Sensation is diminished on the left Deep Tendon Reflexes: 2+ and symmetric in the biceps and patellae.  Cerebellar: No ataxia on the right   I have reviewed labs in epic and the results pertinent to this consultation are: CMP-unremarkable  I have reviewed the images obtained: CT perfusion significant mismatch between cerebral blood flow and cerebral blood volume, particularly in the posterior temporal region.  Impression: 68 year old female with signs and symptoms of a right MCA stroke  Recommendations: 1. HgbA1c, fasting lipid panel 2. MRI, MRA  of the brain without contrast 3. Frequent neuro checks 4. Echocardiogram 5. Carotid dopplers 6. Prophylactic therapy-Antiplatelet med: Aspirin - dose 325mg  PO or 300mg  PR 7. Risk factor modification 8. Telemetry monitoring 9. PT consult, OT  consult, Speech consult 10. To IR for consideration of intervention   This patient is critically ill and at significant risk of neurological worsening, death and care requires constant monitoring of vital signs, hemodynamics,respiratory and cardiac monitoring, neurological assessment, discussion with family, other specialists and medical decision making of high complexity. I spent  90 minutes of neurocritical care time  in the care of  this patient.  Roland Rack, MD [facility] [phone]  If 7pm- 7am, please page neurology on call as listed in [place]. 01/29/2014  7:38 AM

## 2014-01-29 NOTE — Progress Notes (Signed)
ABG results reported to RN.   

## 2014-01-29 NOTE — Progress Notes (Signed)
MCA revascularization- [phone]

## 2014-01-29 NOTE — Anesthesia Procedure Notes (Signed)
Procedure Name: Intubation Date/Time: 01/29/2014 8:01 AM Performed by: Ollen Bowl Pre-anesthesia Checklist: Patient identified, Emergency Drugs available, Suction available, Patient being monitored and Timeout performed Patient Re-evaluated:Patient Re-evaluated prior to inductionOxygen Delivery Method: Circle system utilized and Simple face mask Preoxygenation: Pre-oxygenation with 100% oxygen Intubation Type: IV induction and Rapid sequence Laryngoscope Size: Mac and 4 Grade View: Grade I Tube type: Subglottic suction tube Tube size: 7.5 mm Number of attempts: 1 Airway Equipment and Method: Patient positioned with wedge pillow and Stylet Placement Confirmation: ETT inserted through vocal cords under direct vision,  positive ETCO2 and breath sounds checked- equal and bilateral Secured at: 22 cm Tube secured with: Tape Dental Injury: Teeth and Oropharynx as per pre-operative assessment

## 2014-01-29 NOTE — Anesthesia Preprocedure Evaluation (Signed)
Anesthesia Evaluation  Patient identified by MRN, date of birth, ID band Patient confused    Reviewed: Patient's Chart, lab work & pertinent test results, Unable to perform ROS - Chart review onlyPreop documentation limited or incomplete due to emergent nature of procedure.  Airway Mallampati: II  TM Distance: <3 FB     Dental  (+) Edentulous Upper, Edentulous Lower, Dental Advisory Given   Pulmonary COPDformer smoker,          Cardiovascular hypertension, Pt. on medications     Neuro/Psych CVA    GI/Hepatic   Endo/Other    Renal/GU      Musculoskeletal   Abdominal   Peds  Hematology   Anesthesia Other Findings   Reproductive/Obstetrics                             Anesthesia Physical Anesthesia Plan  ASA: III and emergent  Anesthesia Plan: General   Post-op Pain Management:    Induction: Intravenous and Rapid sequence  Airway Management Planned: Oral ETT  Additional Equipment:   Intra-op Plan:   Post-operative Plan: Extubation in OR and Possible Post-op intubation/ventilation  Informed Consent: I have reviewed the patients History and Physical, chart, labs and discussed the procedure including the risks, benefits and alternatives for the proposed anesthesia with the patient or authorized representative who has indicated his/her understanding and acceptance.   Dental advisory given and Only emergency history available  Plan Discussed with: CRNA and Anesthesiologist  Anesthesia Plan Comments:         Anesthesia Quick Evaluation

## 2014-01-29 NOTE — Progress Notes (Signed)
  Echocardiogram 2D Echocardiogram has been performed.  Summer Bradley 01/29/2014, 2:32 PM

## 2014-01-29 NOTE — ED Provider Notes (Signed)
CSN: [id number]     Arrival date & time 01/29/14  0604 History   First MD Initiated Contact with Patient 01/29/14 [phone]     No chief complaint on file.    (Consider location/radiation/quality/duration/timing/severity/associated sxs/prior Treatment) HPI Comments: Patient is a 68 year old female with history of atrial fibrillation on anticoagulation by Coumadin. She reportedly woke this morning at approximately 4:30 AM and was feeling well. Shortly thereafter, she developed weakness in her left arm and left leg which caused her to tip over and out of her chair. She was brought by EMS with strokelike symptoms and a code stroke was called. She arrives here complaining of numbness of her left arm and left leg.  Patient is a 68 y.o. female presenting with weakness. The history is provided by the patient.  Weakness This is a new problem. Episode onset: 4:30 AM. The problem occurs constantly. The problem has been rapidly worsening. Associated symptoms include headaches. Pertinent negatives include no chest pain and no shortness of breath. Nothing aggravates the symptoms. Nothing relieves the symptoms. She has tried nothing for the symptoms. The treatment provided no relief.    No past medical history on file. No past surgical history on file. No family history on file. History  Substance Use Topics  . Smoking status: Not on file  . Smokeless tobacco: Not on file  . Alcohol Use: Not on file   OB History    No data available     Review of Systems  Respiratory: Negative for shortness of breath.   Cardiovascular: Negative for chest pain.  Neurological: Positive for weakness and headaches.  All other systems reviewed and are negative.     Allergies  Review of patient's allergies indicates not on file.  Home Medications   Prior to Admission medications   Not on File   There were no vitals taken for this visit. Physical Exam  Constitutional: She is oriented to person, place, and time.  She appears well-developed and well-nourished. No distress.  HENT:  Head: Normocephalic.  Mouth/Throat: Oropharynx is clear and moist.  There is a superficial abrasion approximately 2.5 cm below the left eye.  Eyes: EOM are normal. Pupils are equal, round, and reactive to light.  Neck: Normal range of motion. Neck supple.  Cardiovascular: Normal rate, regular rhythm and normal heart sounds.   No murmur heard. Pulmonary/Chest: Effort normal and breath sounds normal. No respiratory distress. She has no wheezes.  Abdominal: Soft. Bowel sounds are normal. She exhibits no distension. There is no tenderness.  Musculoskeletal: Normal range of motion. She exhibits no edema.  Lymphadenopathy:    She has no cervical adenopathy.  Neurological: She is alert and oriented to person, place, and time.  There is significant weakness of the left arm, left leg, and left facial droop.  Skin: Skin is warm and dry. She is not diaphoretic.  Nursing note and vitals reviewed.   ED Course  Procedures (including critical care time) Labs Review Labs Reviewed  ETHANOL  PROTIME-INR  APTT  CBC  DIFFERENTIAL  COMPREHENSIVE METABOLIC PANEL  URINE RAPID DRUG SCREEN (HOSP PERFORMED)  URINALYSIS, ROUTINE W REFLEX MICROSCOPIC  I-STAT CHEM 8, ED  I-STAT TROPOININ, ED  I-STAT TROPOININ, ED    Imaging Review No results found.   EKG Interpretation   Date/Time:  Monday January 29 2014 06:58:31 EST Ventricular Rate:  129 PR Interval:    QRS Duration: 79 QT Interval:  311 QTC Calculation: 456 R Axis:   61 Text Interpretation:  Atrial  fibrillation Ventricular premature complex  Nonspecific repol abnormality, diffuse leads Confirmed by DELOS  MD,  Akansha Wyche ([id number]) on 01/29/2014 7:21:58 AM      MDM   Final diagnoses:  None    Patient presents here with a left-sided hemiparesis that apparently started at 4:30 this morning. Patient arrived here as a code stroke and was evaluated immediately by myself  and neurology. CT scan of the head reveals a small stripe of subdural blood at the posterior falx. Due to the severity of the neurologic deficit, neurology is entertaining the possibility of thrombolysis by interventional radiology. She will be admitted to the neurology service and will likely undergo intra-arterial TPA.  CRITICAL CARE Performed by: Veryl Speak Total critical care time: 30 minutes Critical care time was exclusive of separately billable procedures and treating other patients. Critical care was necessary to treat or prevent imminent or life-threatening deterioration. Critical care was time spent personally by me on the following activities: development of treatment plan with patient and/or surrogate as well as nursing, discussions with consultants, evaluation of patient's response to treatment, examination of patient, obtaining history from patient or surrogate, ordering and performing treatments and interventions, ordering and review of laboratory studies, ordering and review of radiographic studies, pulse oximetry and re-evaluation of patient's condition.     Veryl Speak, MD 02/01/14 [phone]

## 2014-01-29 NOTE — ED Notes (Signed)
Arrives via [facility] EMS code stroke, LKW 0400, hx of same, L sided weakness and vision loss, fell and hit L face d/u weakness.

## 2014-01-29 NOTE — Transfer of Care (Signed)
Immediate Anesthesia Transfer of Care Note  Patient: Summer Bradley  Procedure(s) Performed: Procedure(s): RADIOLOGY WITH ANESTHESIA (N/A)  Patient Location: PACU and ICU  Anesthesia Type:General  Level of Consciousness: Patient remains intubated per anesthesia plan  Airway & Oxygen Therapy: Patient remains intubated per anesthesia plan and Patient placed on Ventilator (see vital sign flow sheet for setting)  Post-op Assessment: Report given to PACU RN and Post -op Vital signs reviewed and stable  Post vital signs: Reviewed and stable  Complications: No apparent anesthesia complications

## 2014-01-29 NOTE — Consult Note (Signed)
PULMONARY / CRITICAL CARE MEDICINE   Name: Summer Bradley MRN: [id number] DOB: 1945-09-10    ADMISSION DATE:  01/29/2014 CONSULTATION DATE:  12/14  REFERRING MD : Neuro  CHIEF COMPLAINT: Left sided weakness  INITIAL PRESENTATION:  Left sided weakness.  STUDIES:  12/4 2 d>>  SIGNIFICANT EVENTS: 12/14 rt mca clot with IR intervention   HISTORY OF PRESENT ILLNESS:   68 yo WF who devloped neuro changes this am 0430 with left side weakness that resulted in a fall. Transported via EMS to [facility] where she was taken to IR and rt mca clot removal and revascularization procedure was performed. Transported tp NSICU on vent  Following procedure. PCCM asked to manage vent.   PAST MEDICAL HISTORY :   has a past medical history of Hypertension; Stroke; A-fib; and COPD (chronic obstructive pulmonary disease).  has past surgical history that includes Abdominal surgery. Prior to Admission medications   Not on File   Allergies  Allergen Reactions  . Lisinopril     FAMILY HISTORY:  has no family status information on file.  SOCIAL HISTORY:  reports that she has quit smoking. She does not have any smokeless tobacco history on file. She reports that she does not drink alcohol or use illicit drugs.  REVIEW OF SYSTEMS: NA  SUBJECTIVE:   VITAL SIGNS: Pulse Rate:  [41-135] 67 (12/14 0730) Resp:  [26-31] 28 (12/14 0730) BP: (132)/(90) 132/90 mmHg (12/14 0730) SpO2:  [94 %-97 %] 95 % (12/14 0730) HEMODYNAMICS:   VENTILATOR SETTINGS:   INTAKE / OUTPUT:  Intake/Output Summary (Last 24 hours) at 01/29/14 1101 Last data filed at 01/29/14 1035  Gross per 24 hour  Intake    600 ml  Output   1125 ml  Net   -525 ml    PHYSICAL EXAMINATION: General:  EWF sedated on vent. Beginning to overbreathe vent. Left periorbital ecchymosis from fall Neuro:  Sedated, no follows commands HEENT: Ott-> vent. PERL 39mm Cardiovascular:  HSIR IR Lungs: CTA Abdomen:  Obese, large midline  scar Musculoskeletal:  intact Skin:  Warm , no lower ext edema  LABS:  CBC  Recent Labs Lab 01/29/14 0609 01/29/14 0616  WBC 13.9*  --   HGB 11.8* 14.6  HCT 39.0 43.0  PLT 195  --    Coag's  Recent Labs Lab 01/29/14 0609  APTT 33  INR 1.18   BMET  Recent Labs Lab 01/29/14 0609 01/29/14 0616  NA 140 139  K 4.2 3.9  CL 100 101  CO2 24  --   BUN 15 17  CREATININE 0.76 0.80  GLUCOSE 128* 131*   Electrolytes  Recent Labs Lab 01/29/14 0609  CALCIUM 9.2   Sepsis Markers No results for input(s): LATICACIDVEN, PROCALCITON, O2SATVEN in the last 168 hours. ABG No results for input(s): PHART, PCO2ART, PO2ART in the last 168 hours. Liver Enzymes  Recent Labs Lab 01/29/14 0609  AST 19  ALT 17  ALKPHOS 61  BILITOT 1.7*  ALBUMIN 3.2*   Cardiac Enzymes No results for input(s): TROPONINI, PROBNP in the last 168 hours. Glucose No results for input(s): GLUCAP in the last 168 hours.  Imaging No results found.   ASSESSMENT / PLAN:  NEUROLOGIC A:   Rt MCA blockage with left sided weakness on admit, post rt mca clot removal and intracranial integrelin.  P:   RASS goal: 0 12/14 from IR sedated on vent Per Neuro Sedated for tube tolerance overnight.  PULMONARY OETT 12/14>> A: VDRF secondary to RT MCA stroke  with revascularization  COPD P:   Vent overnight due to COPD and heart rate issues BD as needed Sedation as needed  CARDIOVASCULAR CVL  rt femoral sleeve>> A:  HTN A fib Post intracranial Integrelin 12/14 0950 P:  Maintain SBP as instructed per neuro sbp 120-130 range. Rate control as needed with dilt drip and prn bb No anticoagulation SCD 2 d echo    RENAL A:  No acute issue P:     GASTROINTESTINAL A:   NPO GI protection P:   NPO PPI  HEMATOLOGIC A:   Hx of Afib. Unknown if on anticoagulation. INR not elevated P:  Check with family for home medications  INFECTIOUS A:  No overt infectious process P:     ENDOCRINE A:   Hyperglycemic  P:   SSI HBG a 1 c    FAMILY  - Updates:   - Inter-disciplinary family meet or Palliative Care meeting due by:  day 7    TODAY'S SUMMARY: 68 yo female who developed left sided weakness 0430 12/14, transported via EMS to [facility]. Taken to IR and Dr. Estanislado Pandy performed Right MCA revascularization procedure. She was intubated by anesthesia and transported to NSICU post op. PCCM asked to assist in her care.   Richardson Landry Minor ACNP Maryanna Shape PCCM Pager [phone] till 3 pm If no answer page [phone] 01/29/2014, 11:05 AM    PCCM ATTENDING: Pt seen on work rounds with care provider noted above. We reviewed pt's initial presentation, consultants notes and hospital database in detail.  The above assessment and plan was formulated under my direction.  In summary: VDRF post neuro IR COPD with recent exacerbation AFRVR (chronic AF) Chronic beta blocker use Hypertension - on nicardipine out of procedure  She is a little unstable to entertain extubation today Cont full vent support - settings reviewed and/or adjusted Cont vent bundle Daily SBT if/when meets criteria - likely extubation in AM 12/15 Change nicardipine to diltiazem which will help with HR control as well as BP PRN metoprolol to maintain HR < 115/min Propofol for sedation Stroke mgmt per Stroke Team  Merton Border, MD;  PCCM service; [place] [phone]

## 2014-01-29 NOTE — Progress Notes (Signed)
1st puncture- [phone]

## 2014-01-29 NOTE — Progress Notes (Signed)
RT Note: Pt transported from VT to 2U57 with no complications, RT will monitor.

## 2014-01-29 NOTE — Sedation Documentation (Signed)
MCA revascularization

## 2014-01-30 ENCOUNTER — Inpatient Hospital Stay (HOSPITAL_COMMUNITY): Payer: Medicare HMO

## 2014-01-30 ENCOUNTER — Encounter (HOSPITAL_COMMUNITY): Payer: Self-pay

## 2014-01-30 DIAGNOSIS — J418 Mixed simple and mucopurulent chronic bronchitis: Secondary | ICD-10-CM | POA: Diagnosis present

## 2014-01-30 DIAGNOSIS — I6320 Cerebral infarction due to unspecified occlusion or stenosis of unspecified precerebral arteries: Secondary | ICD-10-CM | POA: Insufficient documentation

## 2014-01-30 LAB — CBC WITH DIFFERENTIAL/PLATELET
Basophils Absolute: 0 10*3/uL (ref 0.0–0.1)
Basophils Relative: 0 % (ref 0–1)
Eosinophils Absolute: 0.1 10*3/uL (ref 0.0–0.7)
Eosinophils Relative: 1 % (ref 0–5)
HCT: 32.6 % — ABNORMAL LOW (ref 36.0–46.0)
Hemoglobin: 9.9 g/dL — ABNORMAL LOW (ref 12.0–15.0)
Lymphocytes Relative: 13 % (ref 12–46)
Lymphs Abs: 1.6 10*3/uL (ref 0.7–4.0)
MCH: 25.6 pg — ABNORMAL LOW (ref 26.0–34.0)
MCHC: 30.4 g/dL (ref 30.0–36.0)
MCV: 84.5 fL (ref 78.0–100.0)
Monocytes Absolute: 0.8 10*3/uL (ref 0.1–1.0)
Monocytes Relative: 6 % (ref 3–12)
Neutro Abs: 10.5 10*3/uL — ABNORMAL HIGH (ref 1.7–7.7)
Neutrophils Relative %: 81 % — ABNORMAL HIGH (ref 43–77)
Platelets: 201 10*3/uL (ref 150–400)
RBC: 3.86 MIL/uL — ABNORMAL LOW (ref 3.87–5.11)
RDW: 19.2 % — ABNORMAL HIGH (ref 11.5–15.5)
WBC: 13 10*3/uL — ABNORMAL HIGH (ref 4.0–10.5)

## 2014-01-30 LAB — PROTIME-INR
INR: 1.3 (ref 0.00–1.49)
Prothrombin Time: 16.3 seconds — ABNORMAL HIGH (ref 11.6–15.2)

## 2014-01-30 LAB — BASIC METABOLIC PANEL
Anion gap: 11 (ref 5–15)
BUN: 11 mg/dL (ref 6–23)
CO2: 24 mEq/L (ref 19–32)
Calcium: 7.8 mg/dL — ABNORMAL LOW (ref 8.4–10.5)
Chloride: 105 mEq/L (ref 96–112)
Creatinine, Ser: 0.72 mg/dL (ref 0.50–1.10)
GFR calc Af Amer: >90 mL/min (ref >90)
GFR calc non Af Amer: 86 mL/min — ABNORMAL LOW (ref >90)
Glucose, Bld: 101 mg/dL — ABNORMAL HIGH (ref 70–99)
Potassium: 4.2 mEq/L (ref 3.7–5.3)
Sodium: 140 mEq/L (ref 137–147)

## 2014-01-30 LAB — PHOSPHORUS: Phosphorus: 2.2 mg/dL — ABNORMAL LOW (ref 2.3–4.6)

## 2014-01-30 LAB — LIPID PANEL
Cholesterol: 114 mg/dL (ref 0–200)
HDL: 24 mg/dL — ABNORMAL LOW (ref >39)
LDL Cholesterol: 70 mg/dL (ref 0–99)
Total CHOL/HDL Ratio: 4.8 RATIO
Triglycerides: 102 mg/dL (ref <150)
VLDL: 20 mg/dL (ref 0–40)

## 2014-01-30 LAB — HEMOGLOBIN A1C
Hgb A1c MFr Bld: 6.4 % — ABNORMAL HIGH (ref <5.7)
Mean Plasma Glucose: 137 mg/dL — ABNORMAL HIGH (ref <117)

## 2014-01-30 LAB — MAGNESIUM: Magnesium: 1.6 mg/dL (ref 1.5–2.5)

## 2014-01-30 LAB — APTT: aPTT: 38 seconds — ABNORMAL HIGH (ref 24–37)

## 2014-01-30 MED ORDER — AMIODARONE LOAD VIA INFUSION
150.0000 mg | Freq: Once | INTRAVENOUS | Status: AC
  Administered 2014-01-30: 150 mg via INTRAVENOUS
  Filled 2014-01-30: qty 83.34

## 2014-01-30 MED ORDER — METOPROLOL TARTRATE 1 MG/ML IV SOLN
5.0000 mg | Freq: Four times a day (QID) | INTRAVENOUS | Status: DC
  Administered 2014-01-30 – 2014-01-31 (×4): 5 mg via INTRAVENOUS
  Filled 2014-01-30 (×9): qty 5

## 2014-01-30 MED ORDER — DEXTROSE 5 % IV SOLN
30.0000 ug/min | INTRAVENOUS | Status: DC
  Administered 2014-01-30: 47.5 ug/min via INTRAVENOUS
  Administered 2014-01-30: 30 ug/min via INTRAVENOUS
  Filled 2014-01-30 (×2): qty 1

## 2014-01-30 MED ORDER — ASPIRIN 300 MG RE SUPP
300.0000 mg | Freq: Every day | RECTAL | Status: DC
  Administered 2014-01-30: 300 mg via RECTAL
  Filled 2014-01-30 (×3): qty 1

## 2014-01-30 MED ORDER — AMIODARONE HCL IN DEXTROSE 360-4.14 MG/200ML-% IV SOLN
30.0000 mg/h | INTRAVENOUS | Status: DC
  Administered 2014-01-30 (×2): 30 mg/h via INTRAVENOUS
  Filled 2014-01-30 (×4): qty 200

## 2014-01-30 MED ORDER — METOPROLOL TARTRATE 1 MG/ML IV SOLN
5.0000 mg | Freq: Once | INTRAVENOUS | Status: AC
  Administered 2014-01-30: 5 mg via INTRAVENOUS
  Filled 2014-01-30: qty 5

## 2014-01-30 MED ORDER — AMIODARONE LOAD VIA INFUSION
150.0000 mg | Freq: Once | INTRAVENOUS | Status: AC
  Administered 2014-01-30: 150 mg via INTRAVENOUS

## 2014-01-30 MED ORDER — ASPIRIN EC 325 MG PO TBEC
325.0000 mg | DELAYED_RELEASE_TABLET | Freq: Every day | ORAL | Status: DC
  Administered 2014-01-31: 325 mg via ORAL
  Filled 2014-01-30: qty 1

## 2014-01-30 MED ORDER — AMIODARONE HCL IN DEXTROSE 360-4.14 MG/200ML-% IV SOLN
60.0000 mg/h | INTRAVENOUS | Status: AC
  Administered 2014-01-30 (×2): 60 mg/h via INTRAVENOUS
  Filled 2014-01-30 (×2): qty 200

## 2014-01-30 MED ORDER — LEVALBUTEROL HCL 0.63 MG/3ML IN NEBU: 0.6300 mg | INHALATION_SOLUTION | Freq: Four times a day (QID) | RESPIRATORY_TRACT | Status: DC | PRN

## 2014-01-30 MED ORDER — LEVALBUTEROL HCL 0.63 MG/3ML IN NEBU
0.6300 mg | INHALATION_SOLUTION | Freq: Three times a day (TID) | RESPIRATORY_TRACT | Status: DC
  Administered 2014-01-31 – 2014-02-02 (×7): 0.63 mg via RESPIRATORY_TRACT
  Filled 2014-01-30 (×12): qty 3

## 2014-01-30 MED ORDER — IPRATROPIUM BROMIDE 0.02 % IN SOLN
0.5000 mg | Freq: Three times a day (TID) | RESPIRATORY_TRACT | Status: DC
  Administered 2014-01-31 – 2014-02-08 (×26): 0.5 mg via RESPIRATORY_TRACT
  Filled 2014-01-30 (×26): qty 2.5

## 2014-01-30 MED ORDER — SODIUM CHLORIDE 0.9 % IV BOLUS (SEPSIS)
500.0000 mL | Freq: Once | INTRAVENOUS | Status: AC
  Administered 2014-01-30: 500 mL via INTRAVENOUS

## 2014-01-30 NOTE — Procedures (Signed)
Extubation Procedure Note  Patient Details:   Name: Summer Bradley DOB: Jul 30, 1945 MRN: [id number]   Airway Documentation:  Airway 7.5 mm (Active)  Secured at (cm) 22 cm 01/30/2014  8:23 AM  Measured From Lips 01/30/2014  8:23 AM  Secured Location Right 01/30/2014  8:23 AM  Secured By [employer] 01/30/2014  8:23 AM  Tube Holder Repositioned Yes 01/30/2014  8:23 AM  Cuff Pressure (cm H2O) 26 cm H2O 01/30/2014  8:23 AM  Site Condition Dry 01/30/2014  8:23 AM    Evaluation  O2 sats: stable throughout Complications: No apparent complications Patient did tolerate procedure well. Bilateral Breath Sounds: Clear, Diminished   Yes  Pt was extubated to 4 LPM nasal cannula. Positive cuff leak noted. Pt has productive cough. Pt was able to speak name and had clear; diminished BBS. SPO2 94% pt does wear 2 LPM nasal cannula at home. RT will continue to monitor.   [name] M 01/30/2014, 10:03 AM

## 2014-01-30 NOTE — Progress Notes (Signed)
This Probation officer was given in report to maintain SBP 120-130 but no order has been located in the manage order system.  Called Dr. Estanislado Pandy to report that pt SBP <100. Dr. Estanislado Pandy stated that he has already written an order for this BP parameter and that CCM is to manage medically treating to meet this BP goal.   This writer still unable to locate this order in the system, except for within a note written by CCM MD.  Called back to [facility] to request plan of care resolution, Elink MD added fluid bolus and drip to raise SBP. See MAR.  Ewart Carrera GARNER

## 2014-01-30 NOTE — Progress Notes (Signed)
Rehab Admissions Coordinator Note:  Patient was screened by Retta Diones for appropriateness for an Inpatient Acute Rehab Consult.  At this time, we are recommending Inpatient Rehab consult.  Retta Diones 01/30/2014, 2:52 PM  I can be reached at [phone].

## 2014-01-30 NOTE — Evaluation (Signed)
Physical Therapy Evaluation Patient Details Name: Summer Bradley MRN: [id number] DOB: 11/26/1945 Today's Date: 01/30/2014   History of Present Illness  68 yo female admitted from [facility] with L side weakness, vision loss and s/p fall hitting L side of face. intubated 01/29/14; 12/14 R MCA clot retrieval with revascularixation. pt extubated 01/30/14 PMH: HTN, CVA, AFIB, COPD, abdominal surg  Clinical Impression  Pt generally weak and debilitated.  Pt's O2 sats remained in low 90's on 4L O2 during mobility, however HR elevates to 150-160's while sitting EOB.  Further mobility deferred at this time.  Noted for pt to have horizontal nystagmus upon returning to supine, however pt denies any dizziness or visual deficits.  Will need to further assess.  Feel pt would benefit from CIR at D/C to maximize independence prior to returning to home with family support.      Follow Up Recommendations CIR    Equipment Recommendations   (TBD)    Recommendations for Other Services Rehab consult     Precautions / Restrictions Precautions Precautions: Fall Precaution Comments: Elevated HR Restrictions Weight Bearing Restrictions: No      Mobility  Bed Mobility Overal bed mobility: Needs Assistance Bed Mobility: Supine to Sit;Sit to Supine     Supine to sit: Mod assist;+2 for physical assistance;HOB elevated Sit to supine: Mod assist;+2 for physical assistance   General bed mobility comments: pt able to A with mobility, but generally weak.  pt needs A with trunk and Bil LEs.    Transfers                    Ambulation/Gait                Stairs            Wheelchair Mobility    Modified Rankin (Stroke Patients Only) Modified Rankin (Stroke Patients Only) Pre-Morbid Rankin Score: Moderate disability Modified Rankin: Severe disability     Balance Overall balance assessment: Needs assistance Sitting-balance support: Bilateral upper extremity supported;Feet  supported Sitting balance-Leahy Scale: Poor Sitting balance - Comments: pt needs UE support to maintain balance.  pt's HR elevated to 150-160's while sitting EOB.                                       Pertinent Vitals/Pain Pain Assessment: No/denies pain    Home Living Family/patient expects to be discharged to:: Inpatient rehab                      Prior Function Level of Independence: Needs assistance   Gait / Transfers Assistance Needed: Used RW for ambulation  ADL's / Homemaking Assistance Needed: Family A with homemaking tasks and pt states she was supposed to have an aide starting on Monday to help with ADLs.          Hand Dominance        Extremity/Trunk Assessment   Upper Extremity Assessment: Defer to OT evaluation           Lower Extremity Assessment: Generalized weakness      Cervical / Trunk Assessment: Normal  Communication   Communication: No difficulties  Cognition Arousal/Alertness: Awake/alert Behavior During Therapy: WFL for tasks assessed/performed Overall Cognitive Status: Within Functional Limits for tasks assessed                      General Comments  Exercises        Assessment/Plan    PT Assessment Patient needs continued PT services  PT Diagnosis Difficulty walking   PT Problem List Decreased strength;Decreased activity tolerance;Decreased balance;Decreased mobility;Decreased knowledge of use of DME;Cardiopulmonary status limiting activity  PT Treatment Interventions DME instruction;Gait training;Stair training;Functional mobility training;Therapeutic activities;Therapeutic exercise;Balance training;Neuromuscular re-education;Patient/family education   PT Goals (Current goals can be found in the Care Plan section) Acute Rehab PT Goals Patient Stated Goal: Back to normal.   PT Goal Formulation: With patient Time For Goal Achievement: 02/13/14 Potential to Achieve Goals: Good    Frequency  Min 4X/week   Barriers to discharge        Co-evaluation               End of Session Equipment Utilized During Treatment: Oxygen Activity Tolerance: Treatment limited secondary to medical complications (Comment) (Limited by HR) Patient left: in bed;with call bell/phone within reach Nurse Communication: Mobility status         Time: 0347-4259 PT Time Calculation (min) (ACUTE ONLY): 33 min   Charges:   PT Evaluation $Initial PT Evaluation Tier I: 1 Procedure PT Treatments $Therapeutic Activity: 23-37 mins   PT G CodesCatarina Hartshorn, [place] [phone] 01/30/2014, 2:19 PM

## 2014-01-30 NOTE — Progress Notes (Signed)
Pt. Was transported to CT & back to 3M05 without any complications.

## 2014-01-30 NOTE — Progress Notes (Signed)
Referring Physician(s): Stroke  Subjective: CVA R MCA clot retrieval with revascularization12/14 am On vent Moves all 4s to command  Allergies: Lisinopril  Medications: Prior to Admission medications   Medication Sig Start Date End Date Taking? Authorizing Provider  albuterol (PROVENTIL HFA;VENTOLIN HFA) 108 (90 BASE) MCG/ACT inhaler Inhale 1 puff into the lungs every 6 (six) hours as needed for wheezing or shortness of breath.   Yes Historical Provider, MD  aspirin EC 81 MG tablet Take 81 mg by mouth daily.   Yes Historical Provider, MD  budesonide-formoterol (SYMBICORT) 160-4.5 MCG/ACT inhaler Inhale 2 puffs into the lungs 2 (two) times daily.   Yes Historical Provider, MD  CALCIUM-VITAMIN D PO Take 1 tablet by mouth daily.   Yes Historical Provider, MD  citalopram (CELEXA) 20 MG tablet Take 20 mg by mouth at bedtime.  01/22/14  Yes Historical Provider, MD  clonazePAM (KLONOPIN) 0.5 MG tablet Take 0.5 mg by mouth daily as needed for anxiety.  12/20/13  Yes Historical Provider, MD  fluticasone (FLONASE) 50 MCG/ACT nasal spray Place 1 spray into both nostrils daily as needed for allergies.  12/06/13  Yes Historical Provider, MD  furosemide (LASIX) 20 MG tablet Take 20 mg by mouth daily.  11/06/13  Yes Historical Provider, MD  ipratropium (ATROVENT HFA) 17 MCG/ACT inhaler Inhale 2 puffs into the lungs 4 (four) times daily.   Yes Historical Provider, MD  levothyroxine (SYNTHROID, LEVOTHROID) 150 MCG tablet Take 150 mcg by mouth daily before breakfast.  01/09/14  Yes Historical Provider, MD  lovastatin (MEVACOR) 10 MG tablet Take 10 mg by mouth daily.  11/06/13  Yes Historical Provider, MD  metoprolol (LOPRESSOR) 100 MG tablet Take 100 mg by mouth 2 (two) times daily.  01/15/14  Yes Historical Provider, MD  Multiple Vitamin (MULTIVITAMIN WITH MINERALS) TABS tablet Take 1 tablet by mouth daily.   Yes Historical Provider, MD  omeprazole (PRILOSEC) 20 MG capsule Take 20 mg by mouth daily.   11/06/13  Yes Historical Provider, MD  verapamil (CALAN-SR) 180 MG CR tablet Take 180 mg by mouth daily.  01/15/14  Yes Historical Provider, MD  warfarin (COUMADIN) 1 MG tablet Take 2.5 mg by mouth daily.  01/15/14  Yes Historical Provider, MD    Review of Systems  Vital Signs: BP 105/53 mmHg  Pulse 80  Temp(Src) 99.2 F (37.3 C) (Oral)  Resp 11  Ht 5\' 2"  (1.575 m)  Wt 87.2 kg (192 lb 3.9 oz)  BMI 35.15 kg/m2  SpO2 100%  Physical Exam  Abdominal:  Rt groin site clean and dry No hematoma Rt foot 2+ pulses  Neurological:  Moves all 4s to command L weaker Tries to open eyes Nodding appropriately  Vitals reviewed.   Imaging: Ct Head Wo Contrast  01/30/2014   CLINICAL DATA:  68 year old female status post right MCA infarct and intra-arterial intervention. Initial encounter.  EXAM: CT HEAD WITHOUT CONTRAST  TECHNIQUE: Contiguous axial images were obtained from the base of the skull through the vertex without intravenous contrast.  COMPARISON:  01/29/2014 and earlier.  FINDINGS: Intubated. Small volume fluid in the paranasal sinuses. Mild right mastoid effusion. No acute osseous abnormality identified. Left face and scalp broad-based soft tissue hematoma. Visualized orbit soft tissues are within normal limits.  Calcified atherosclerosis at the skull base. Interval regression of patchy hyperdensity in the posterior right MCA territory. Mild parenchymal hypodensity in the same area compared to the presentation study at 0614 hr on 01/29/2014. No regional mass effect. Superimposed chronic appearing  anterior and posterior MCA division infarcts.  Stable parafalcine small volume subdural hematoma. No new intracranial hemorrhage. No ventriculomegaly. No acute midline shift or intracranial mass effect. Stable small chronic infarct in the inferior right cerebellum. Stable cerebral white matter hypodensity elsewhere. Decreased conspicuity of hyperdensity at the right MCA bifurcation. Stable  intracranial vascular hyperdensity elsewhere.  IMPRESSION: 1. Resolved right MCA territory hyperdensity present at 1056 hrs yesterday, most likely was post Neurointervention parenchymal contrast staining. 2. Mild cytotoxic edema suspected in that same right MCA territory. Underlying chronic ischemic changes. 3. Stable parafalcine subdural hematoma. No new intracranial hemorrhage or acute mass effect. 4. No new intracranial abnormality.   Electronically Signed   By: Lars Pinks M.D.   On: 01/30/2014 07:34   Ct Head Wo Contrast  01/29/2014   CLINICAL DATA:  Right MCA clot retrieval.  Stroke.  EXAM: CT HEAD WITHOUT CONTRAST  TECHNIQUE: Contiguous axial images were obtained from the base of the skull through the vertex without intravenous contrast.  COMPARISON:  CT 01/29/2014  FINDINGS: Small interhemispheric subdural hemorrhage unchanged.  Acute infarct in the right MCA territory. This study was done following angiography and clot retrieval. There is vascular contrast present. There is high density in the right temporoparietal cortex which may be due to contrast staining from angiography versus hemorrhage. Continued follow-up recommended. No significant mass effect or midline shift. Chronic infarcts in the right frontal temporal lobe and right parietal lobe as noted previously.  Ventricle size is normal. No shift of the midline structures. Calvarium is intact.  IMPRESSION: Acute infarct right MCA territory as noted on CT perfusion study. There is interval development of high density in the right temporoparietal cortex which may be due to contrast material or hemorrhage. Continued follow-up recommended.  Small interhemispheric subdural hematoma is unchanged.   Electronically Signed   By: Franchot Gallo M.D.   On: 01/29/2014 11:22   Ct Head Wo Contrast  01/29/2014   CLINICAL DATA:  Code stroke.  Left-sided flaccid.  EXAM: CT HEAD WITHOUT CONTRAST  TECHNIQUE: Contiguous axial images were obtained from the base of  the skull through the vertex without intravenous contrast.  COMPARISON:  None.  FINDINGS: Skull and Sinuses:There is a contusion of the left frontal scalp without underlying fracture.  Sinuses and mastoids are clear.  Orbits: No acute abnormality.  Brain: High-density thickening of the posterior falx up to 4 mm, most consistent with subdural hemorrhage. No related mass effect.  There is discrete cortical and subcortical low-attenuation in the anterior and posterior aspect of the right MCA territory, appearance favoring gliosis rather than cytotoxic edema. No definitive acute infarct. Subtle high attenuation of the right M2 segment relative to the left.  Critical Value/emergent results were called by telephone at the time of interpretation on 01/29/2014 at 6:34 am to Dr. Leonel Ramsay, who verbally acknowledged these results.  IMPRESSION: 1. 4 mm thick posterior falx subdural hemorrhage. 2. Prominent density at the right M1/M2 junction. CTA is already planned. 3. Established infarcts in the anterior and posterior right MCA territory. No definitive acute infarct. 4. Left frontal scalp contusion.  No acute fracture.   Electronically Signed   By: Jorje Guild M.D.   On: 01/29/2014 06:38   Ct Cerebral Perfusion W/cm  01/29/2014   CLINICAL DATA:  Code stroke. Left-sided weakness. Last normal 8 p.m.  EXAM: CT CEREBRAL PERFUSION WITH CONTRAST  TECHNIQUE: Serial imaging through the cerebral hemispheres was performed after bolus administration of 40 cc Omnipaque 350 intravenous. Coverage extended from  the level of the temporal poles nearly to the vertex.  CONTRAST:  35mL OMNIPAQUE IOHEXOL 350 MG/ML SOLN  COMPARISON:  Head CT from earlier the same day.  FINDINGS: Source images notable for a thin hematoma along the posterior falx, noted on initial noncontrast examination.  There is cut off involving the right M1-M2 junction, consistent with thrombus, with oligemia throughout the right MCA territory. There is gradual  enhancement of vessels over the right cerebral convexity via pial/pial collaterals, a favorable finding.  Elevated mean transit time densely in the right MCA territory, with partial sparing of the temporal lobe. Although there is clear decrease in cerebral blood volume at the core of the right MCA territory, there is peripheral blood volume sparing compatible with penumbra. Penumbra is preset in the region of the motor strip and of the posterior temporal lobe. Dense perfusion defects at the extreme anterior and posterior margins of the right MCA territory, consistent with remote infarcts and gliosis.  Critical Value/emergent results were called by telephone at the time of interpretation on 01/29/2014 at 655 am to Dr. Roland Rack , who verbally acknowledged these results.  IMPRESSION: 1. Occlusive thrombus at the right M1-M2 junction. Subjectively good pial/pial collateral circulation. 2. Perfusion findings suggest moderate acute infarct in the central right MCA territory with rim of penumbra, as above. 3. Remote infarcts at the anterior and posterior margins of the right MCA territory. 4. Thin posterior falx subdural hemorrhage.   Electronically Signed   By: Jorje Guild M.D.   On: 01/29/2014 07:31   Dg Chest Port 1 View  01/29/2014   CLINICAL DATA:  Respiratory failure.  EXAM: PORTABLE CHEST - 1 VIEW  COMPARISON:  01/10/2014  FINDINGS: Endotracheal tube is present with tip 5.2 cm above the carina. Patient is slightly rotated to the left. There is mild prominence of the perihilar markings worse over the infrahilar regions likely mild interstitial edema and less likely infection. Mild stable cardiomegaly. Remainder of the exam is unchanged.  IMPRESSION: Bilateral perihilar/infrahilar opacification likely interstitial edema and less likely infection.  Endotracheal tube with tip 5.2 cm above the carina.   Electronically Signed   By: Marin Olp M.D.   On: 01/29/2014 13:24    Labs:  CBC:  Recent  Labs  01/29/14 0609 01/29/14 0616 01/30/14 0337  WBC 13.9*  --  13.0*  HGB 11.8* 14.6 9.9*  HCT 39.0 43.0 32.6*  PLT 195  --  201    COAGS:  Recent Labs  01/29/14 0609 01/30/14 0337  INR 1.18 1.30  APTT 33 38*    BMP:  Recent Labs  01/29/14 0609 01/29/14 0616 01/30/14 0337  NA 140 139 140  K 4.2 3.9 4.2  CL 100 101 105  CO2 24  --  24  GLUCOSE 128* 131* 101*  BUN 15 17 11   CALCIUM 9.2  --  7.8*  CREATININE 0.76 0.80 0.72  GFRNONAA 84*  --  86*  GFRAA >90  --  >90    LIVER FUNCTION TESTS:  Recent Labs  01/29/14 0609  BILITOT 1.7*  AST 19  ALT 17  ALKPHOS 61  PROT 7.0  ALBUMIN 3.2*    Assessment and Plan:  CVA R MCA clot retrieval- revascularization 01/29/14   I spent a total of 15 minutes face to face in clinical consultation/evaluation, greater than 50% of which was counseling/coordinating care for R MCA clot retrieval with revascularization  Signed: TURPIN,PAMELA A 01/30/2014, 8:10 AM

## 2014-01-30 NOTE — Progress Notes (Signed)
STROKE TEAM PROGRESS NOTE   HISTORY Summer Bradley is a 68 y.o. female who got up around 4:30 AM 01/29/2014 to go to the bathroom and then sat down in her chair. She is not certain if her left arm was working at that time or not. She then fell out of her chair around 5 AM and that time noticed that her left arm wasn't working. She did not realize that anything was wrong until she fell out of her chair.  She was in her normal state of health ongoing to bed the evening prior. She was last known well at 8p on 01/28/2014.  In the ER, a CT was obtained which shows evidence of a previous stroke, and a CT perfusion scan was obtained which shows a significant mismatch demonstrating an area of penumbra, though there is an smaller area of likely infarct core as well.  Patient was not administered TPA secondary to being  outside of the tPA window with question of subdural hematoma on CT. Due to mismatch, she was taken to neuro intervention where she had complete revascularization of RT MCA M1 occlusion using 2 passes with the Solitaire and 10 mg of IA Integrelin. TICI 3 flow reesablished. Afterwards, she was admitted to the neuro ICU for further evaluation and treatment.   SUBJECTIVE (INTERVAL HISTORY) Her daughter and granddaughters are at the bedside.  Overall she feels her condition is gradually improving.    OBJECTIVE Temp:  [97.7 F (36.5 C)-100.1 F (37.8 C)] 99.2 F (37.3 C) (12/15 0801) Pulse Rate:  [50-154] 93 (12/15 0900) Cardiac Rhythm:  [-] Atrial fibrillation (12/15 0800) Resp:  [11-32] 22 (12/15 0900) BP: (76-140)/(49-99) 111/52 mmHg (12/15 0900) SpO2:  [89 %-100 %] 100 % (12/15 0900) Arterial Line BP: (117-150)/(76-91) 126/79 mmHg (12/14 1500) FiO2 (%):  [40 %-60 %] 40 % (12/15 0823) Weight:  [87.2 kg (192 lb 3.9 oz)] 87.2 kg (192 lb 3.9 oz) (12/14 1130)  No results for input(s): GLUCAP in the last 168 hours.  Recent Labs Lab 01/29/14 0609 01/29/14 0616 01/30/14 0337  NA 140 139  140  K 4.2 3.9 4.2  CL 100 101 105  CO2 24  --  24  GLUCOSE 128* 131* 101*  BUN 15 17 11   CREATININE 0.76 0.80 0.72  CALCIUM 9.2  --  7.8*  MG  --   --  1.6  PHOS  --   --  2.2*    Recent Labs Lab 01/29/14 0609  AST 19  ALT 17  ALKPHOS 61  BILITOT 1.7*  PROT 7.0  ALBUMIN 3.2*    Recent Labs Lab 01/29/14 0609 01/29/14 0616 01/30/14 0337  WBC 13.9*  --  13.0*  NEUTROABS 10.7*  --  10.5*  HGB 11.8* 14.6 9.9*  HCT 39.0 43.0 32.6*  MCV 82.6  --  84.5  PLT 195  --  201   No results for input(s): CKTOTAL, CKMB, CKMBINDEX, TROPONINI in the last 168 hours.  Recent Labs  01/29/14 0609 01/30/14 0337  LABPROT 15.1 16.3*  INR 1.18 1.30    Recent Labs  01/29/14 0659  COLORURINE AMBER*  LABSPEC 1.042*  PHURINE 5.5  GLUCOSEU NEGATIVE  HGBUR NEGATIVE  BILIRUBINUR NEGATIVE  KETONESUR NEGATIVE  PROTEINUR 30*  UROBILINOGEN 1.0  NITRITE NEGATIVE  LEUKOCYTESUR NEGATIVE       Component Value Date/Time   CHOL 114 01/30/2014 0337   TRIG 102 01/30/2014 0337   HDL 24* 01/30/2014 0337   CHOLHDL 4.8 01/30/2014 0337   VLDL  20 01/30/2014 0337   LDLCALC 70 01/30/2014 0337   Lab Results  Component Value Date   HGBA1C 6.4* 01/30/2014      Component Value Date/Time   LABOPIA NONE DETECTED 01/29/2014 0659   COCAINSCRNUR NONE DETECTED 01/29/2014 0659   LABBENZ NONE DETECTED 01/29/2014 0659   AMPHETMU NONE DETECTED 01/29/2014 0659   THCU NONE DETECTED 01/29/2014 0659   LABBARB NONE DETECTED 01/29/2014 0659     Recent Labs Lab 01/29/14 0609  ETH <11    Ct Head Wo Contrast  01/30/2014    1. Resolved right MCA territory hyperdensity present at 1056 hrs yesterday, most likely was post Neurointervention parenchymal contrast staining. 2. Mild cytotoxic edema suspected in that same right MCA territory. Underlying chronic ischemic changes. 3. Stable parafalcine subdural hematoma. No new intracranial hemorrhage or acute mass effect. 4. No new intracranial abnormality.     01/29/2014    Acute infarct right MCA territory as noted on CT perfusion study. There is interval development of high density in the right temporoparietal cortex which may be due to contrast material or hemorrhage. Continued follow-up recommended.  Small interhemispheric subdural hematoma is unchanged.    01/29/2014   1. 4 mm thick posterior falx subdural hemorrhage. 2. Prominent density at the right M1/M2 junction. CTA is already planned. 3. Established infarcts in the anterior and posterior right MCA territory. No definitive acute infarct. 4. Left frontal scalp contusion.  No acute fracture.     Ct Cerebral Perfusion W/cm 01/29/2014    1. Occlusive thrombus at the right M1-M2 junction. Subjectively good pial/pial collateral circulation. 2. Perfusion findings suggest moderate acute infarct in the central right MCA territory with rim of penumbra, as above. 3. Remote infarcts at the anterior and posterior margins of the right MCA territory. 4. Thin posterior falx subdural hemorrhage.     Cerebral Angiogram 01/29/2014 S/P Bilateral common carotid arteriograms ,followed by complete revascularization of RT MCA M1 occlusion usingX2 passes with the Solitaire FR 1mm x 64mm retrieval device And 10 mg of  superselective intracranial IA Integrelin. TICI 3 flow reesablished.   Dg Chest Port 1 View 01/30/2014    COPD with bibasilar scarring with superimposed subsegmental atelectasis. There is no evidence of CHF.    01/29/2014    Bilateral perihilar/infrahilar opacification likely interstitial edema and less likely infection.  Endotracheal tube with tip 5.2 cm above the carina.      2D Echocardiogram  EF 50-55% with no source of embolus.   PHYSICAL EXAM Elderly Caucasian lady not in distress.intubatedt. Afebrile. Head is nontraumatic. Neck is supple without bruit.   Cardiac exam no murmur or gallop. Lungs are clear to auscultation. Distal pulses are well felt.  Neurological Exam : Awake and alert follows  commands well. Extraocular movements are full range without nystagmus. Fundi were not visualized. Blinks to threat bilaterally. Face is symmetric. Tongue midline. Motor system exam able to move all 4 extremities against gravity possibly mild weakness of her left hand and grip and intrinsic hand muscles. Plantars are downgoing. Sensation appears preserved. Coordination cannot be reliably tested. ASSESSMENT/PLAN Ms. Emberleigh Reily is a 68 y.o. female with history of hypertension, stroke, afib and COPD presenting with Left-sided weakness. She did not receive IV t-PA due to delay in arrival, ? SDH.   Stroke:  Suspect right MCA infarct status post complete vascularization of right MCA M1 occlusion with IA Integrilin and mechanical thrombectomy, workup underway.  MRI  pending   2D Echo  No source of embolus  HgbA1c 6.4  SCDs for VTE prophylaxis  Diet NPO time specified   aspirin 81 mg orally every day and warfarin prior to admission, we will add aspirin PR or PO today. Consider anticoagulation once MRI completed  Ongoing aggressive stroke risk factor management  Therapy recommendations:  pending   Disposition:  pending   Acute Respiratory Failure  Intubated for neuro intervention  Critical care managing ventilator  Anticipate extubation today  Atrial Fibrillation  Home meds:  Warfarin  INR subtherapeutic on admission, INR 1.18    placed on amiodarone for rate control in setting of hypertension  Plan anticoagulation at discharge, pending MRI results   Hypertension  Systolic blood pressure less than 100 post procedure. Given IV fluid bolus. Hypotension resolved  Continue to monitor  Hyperlipidemia  Home meds:  Mevacor  LDL 70, goal < 70  Reason Mevacor was able to swallow  Thanks for letting me know. She does well.Other Stroke Risk Factors  Advanced age  Obesity, Body mass index is 35.15 kg/(m^2).   Hx stroke/TIA  Other Active Problems  COPD  Depression, on  chronic SSRI  Hospital day # [place] for Pager information 01/30/2014 6:29 PM I have personally examined this patient, reviewed notes, independently viewed imaging studies, participated in medical decision making and plan of care. I have made any additions or clarifications directly to the above note. Agree with note above. Plan to extubate today. Check MRI scan of the brain later. Continue stroke risk stratification workup. Discuss with family and answered questions This patient is critically ill and at significant risk of neurological worsening, death and care requires constant monitoring of vital signs, hemodynamics,respiratory and cardiac monitoring,review of multiple databases, neurological assessment, discussion with family, other specialists and medical decision making of high complexity.I have made any additions or clarifications directly to the above note.  I spent 30 minutes of neurocritical care time  in the care of  this patient.  Antony Contras, MD Medical Director [facility] Pager: [phone] 01/30/2014 7:55 PM    To contact Stroke Continuity provider, please refer to [email or web address] After hours, contact General Neurology

## 2014-01-30 NOTE — Clinical Documentation Improvement (Signed)
Pt clarify if pt's with left side weakness that resulted in a fall s/p CVA can be further specified as one of the diagnoses listed below and document in pn or d/c summary.    Possible Clinical Conditions? Left hemiparesis Left hemiplegia  ____Other Condition__________________ ____Cannot Clinically Determine   Supporting Information: Risk Factors: R MCA stroke, SDH,  R MCA Stroke,  Signs & Symptoms: with left side weakness that resulted in a fall.per pn 01/29/14 She has a significant left hemi-neglect HP L sided weakness per ED nurse note 01/29/14 she has no movement of the left arm and 3/5 weakness of the left leg per HP  Diagnostics: CT Head wo contrast IMPRESSION: Acute infarct right MCA territory as noted on CT perfusion study. There is interval development of high density in the right temporoparietal cortex which may be due to contrast material or hemorrhage.  Treatment monitoring  Thank You, Heloise Beecham ,RN Clinical Documentation Specialist:  [place] [employer]

## 2014-01-30 NOTE — Progress Notes (Signed)
RASS 0. + F/C. Passed SBT. Extubated and tolerating. Strong cough  Filed Vitals:   01/30/14 1200 01/30/14 1231 01/30/14 1300 01/30/14 1309  BP: 124/96  119/73   Pulse: 131  33   Temp:  99.4 F (37.4 C)    TempSrc:  Oral    Resp: 31  30   Height:      Weight:      SpO2: 95%  95% 95%   Small abrasion around L eye No JVD Scattered rhonchi No wheezes IRIR, tachy, no M NABS, soft Ext warm without edema MAEs  I have reviewed all of today's lab results. Relevant abnormalities are discussed in the A/P section  CXR: bibasilar atx  IMPRESSION: Acute R MCA CVA, s/p angiography, R MCA clot retrieval Acute VDRF, appears resolved COPD with recent exacerbation  Chronic O2 therapy (2 lpm NS) Chronic PAF - subtherapeutic PT-INR on admission AFRVR H/O hypertension Hypotension, resolved Hypothyroidism, chronic Mild hyperglycemia H/O depression - chronic SSRI therapy  PLAN/REC: Stroke care per Stroke service Supplemental O2 to maintain SpO2 92-97% Monitor resp status in ICU post extubation Cont scheduled and PRN metoprolol to maintain HR < 115/min Amiodarone initiated 12/15 for rate control in setting of hypotension Stroke service to decide on anticoagulation after MRI completed Cont SSI Resume SSRI when able to take POs  Discussed with Dr Leonie Man Care plan reviewed with RN  Family updated @ bedside  35 mins CCM time   Merton Border, MD ; [facility] service [place] [phone].  After 5:30 PM or weekends, call [phone]

## 2014-01-30 NOTE — Progress Notes (Signed)
Speech Language Pathology     Patient Details Name: Summer Bradley MRN: [id number] DOB: 03/03/1945 Today's Date: 01/30/2014 Time:  -     Received order for swallow assessment. Pt. Just extubated at 0955. Protocol is hold for minimum of 4 hours post extubation.  Will see if able this afternoon or defer to tomorrow am.  Cranford Mon.Ed [employer] [phone]

## 2014-01-30 NOTE — Progress Notes (Signed)
SLP Cancellation Note  Patient Details Name: Summer Bradley MRN: [id number] DOB: 27-Aug-1945   Cancelled treatment:        Pt. Continues on ventilator.  Will continue to follow and evaluate when appropriate.   [name] 01/30/2014, 9:21 AM  Orbie Pyo Colvin Caroli.Ed [employer] [phone]

## 2014-01-30 NOTE — Progress Notes (Signed)
UR completed.  Jamarr Treinen, RN BSN [facility] CCM Trauma/Neuro ICU Case Manager [phone]  

## 2014-01-30 NOTE — Evaluation (Signed)
Clinical/Bedside Swallow Evaluation Patient Details  Name: Summer Bradley MRN: [id number] Date of Birth: [birth year]  Today's Date: 01/30/2014 Time: 1515-1530 SLP Time Calculation (min) (ACUTE ONLY): 15 min  Past Medical History:  Past Medical History  Diagnosis Date  . Hypertension   . Stroke   . A-fib   . COPD (chronic obstructive pulmonary disease)    Past Surgical History:  Past Surgical History  Procedure Laterality Date  . Abdominal surgery    . Radiology with anesthesia N/A 01/29/2014    Procedure: RADIOLOGY WITH ANESTHESIA;  Surgeon: Luanne Bras, MD;  Location: [place];  Service: Radiology;  Laterality: N/A;   HPI:  68 yo WF who devloped neuro changes 01/29/14  with left side weakness that resulted in a fall. Transported via EMS to [facility] where she was taken to IR and rt mca clot removal and revascularization procedure was performed. Intubated 12/14-12/15.  Swallow eval ordered.      Assessment / Plan / Recommendation Clinical Impression  Pt presents with s/s of dysphagia s/p CVA, leading to focal CN deficits CN V, VII, and XII on left.  Functionally, these deficits manifest as left buccal residue, reduced oral manipulation and control, anterior spillage, and wet phonation and cough associated with ice chips/thin liquids.  For today, recommend maintaining NPO status except meds crushed in puree.  SLP will f/u next date for improvements/readiness to advance diet.  D/W RN.     Aspiration Risk  Moderate    Diet Recommendation NPO except meds   Medication Administration: Crushed with puree Postural Changes and/or Swallow Maneuvers: Seated upright 90 degrees    Other  Recommendations Oral Care Recommendations:  (QD)   Follow Up Recommendations  Inpatient Rehab    Frequency and Duration min 3x week  2 weeks       SLP Swallow Goals     Swallow Study Prior Functional Status       General Date of Onset: 01/29/14 HPI: 68 yo WF who devloped neuro changes 01/29/14   with left side weakness that resulted in a fall. Transported via EMS to [facility] where she was taken to IR and rt mca clot removal and revascularization procedure was performed. Intubated 12/14-12/15.  Swallow eval ordered.    Type of Study: Bedside swallow evaluation Previous Swallow Assessment: none per records Diet Prior to this Study: NPO Temperature Spikes Noted: No Respiratory Status: Nasal cannula History of Recent Intubation: Yes Length of Intubations (days): 1 days Date extubated: 01/30/14 Behavior/Cognition: Alert;Cooperative Oral Cavity - Dentition: Edentulous Self-Feeding Abilities: Needs assist Patient Positioning: Upright in bed Baseline Vocal Quality: Hoarse;Low vocal intensity Volitional Cough: Strong Volitional Swallow: Able to elicit    Oral/Motor/Sensory Function Overall Oral Motor/Sensory Function: Other (comment) (asymmetry CN V, XII; decreased sensation on right - CN V)   Ice Chips Ice chips: Impaired Presentation: Spoon Oral Phase Impairments: Reduced labial seal Oral Phase Functional Implications: Left anterior spillage Pharyngeal Phase Impairments: Suspected delayed Swallow;Wet Vocal Quality;Cough - Immediate   Thin Liquid Thin Liquid: Impaired Presentation: Spoon Oral Phase Impairments: Reduced labial seal Oral Phase Functional Implications: Left anterior spillage Pharyngeal  Phase Impairments: Suspected delayed Swallow;Multiple swallows;Wet Vocal Quality;Cough - Immediate    Nectar Thick Nectar Thick Liquid: Not tested   Honey Thick Honey Thick Liquid: Not tested   Puree Puree: Impaired Presentation: Spoon Oral Phase Impairments: Reduced lingual movement/coordination Oral Phase Functional Implications: Left lateral sulci pocketing;Oral residue Pharyngeal Phase Impairments: Multiple swallows   Solid  Johnaton Sonneborn L. Tivis Ringer, [place] CCC/SLP Pager [phone]  Solid: Not tested       [name] 01/30/2014,3:56 PM

## 2014-01-30 NOTE — Evaluation (Signed)
Speech Language Pathology Evaluation Patient Details Name: Yousra Ivens MRN: [id number] DOB: 08/01/1945 Today's Date: 01/30/2014 Time: 2334-3568 SLP Time Calculation (min) (ACUTE ONLY): 15 min  Problem List:  Patient Active Problem List   Diagnosis Date Noted  . 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 Mixed simple and mucopurulent chronic bronchitis   . Stroke 01/29/2014  . Respiratory failure 01/29/2014   Past Medical History:  Past Medical History  Diagnosis Date  . Hypertension   . Stroke   . A-fib   . COPD (chronic obstructive pulmonary disease)    Past Surgical History:  Past Surgical History  Procedure Laterality Date  . Abdominal surgery    . Radiology with anesthesia N/A 01/29/2014    Procedure: RADIOLOGY WITH ANESTHESIA;  Surgeon: Luanne Bras, MD;  Location: [place];  Service: Radiology;  Laterality: N/A;   HPI:  68 yo WF who devloped neuro changes 01/29/14  with left side weakness that resulted in a fall. Transported via EMS to [facility] where she was taken to IR and rt mca clot removal and revascularization procedure was performed. Intubated 12/14-12/15.        Assessment / Plan / Recommendation Clinical Impression  Pt presents with adequate speech clarity, left CN deficits (V, VII, XII), and cognitive deficits c/w right CVA including flat affect, reduced insight, mild short-term memory deficit, and pragmatic impairments.  Rec SLP f/u for cognition while on acute care and in next venue of care.     SLP Assessment  Patient needs continued Speech Lanaguage Pathology Services    Follow Up Recommendations  Inpatient Rehab    Frequency and Duration min 3x week  2 weeks   Pertinent Vitals/Pain Pain Assessment: No/denies pain   SLP Goals  Potential to Achieve Goals (ACUTE ONLY): Good  SLP Evaluation Prior Functioning  Cognitive/Linguistic Baseline: Information not available Type of Home: House  Lives With: Alone Available  Help at Discharge: Family   Cognition  Overall Cognitive Status: Impaired/Different from baseline Arousal/Alertness: Awake/alert Orientation Level: Oriented X4 Attention: Sustained Sustained Attention: Appears intact Memory: Impaired Memory Impairment: Retrieval deficit;Decreased short term memory Decreased Short Term Memory: Verbal basic;Verbal complex Awareness: Impaired Awareness Impairment: Intellectual impairment Problem Solving: Impaired Problem Solving Impairment: Verbal basic Safety/Judgment: Impaired    Comprehension  Auditory Comprehension Overall Auditory Comprehension: Appears within functional limits for tasks assessed Visual Recognition/Discrimination Discrimination: Not tested Reading Comprehension Reading Status: Not tested    Expression Expression Primary Mode of Expression: Verbal Verbal Expression Overall Verbal Expression: Appears within functional limits for tasks assessed   Oral / Motor Oral Motor/Sensory Function Overall Oral Motor/Sensory Function:  (see bedside swallow eval) Motor Speech Overall Motor Speech: Appears within functional limits for tasks assessed   Jasman Pfeifle L. Tivis Ringer, [place] CCC/SLP Pager [phone]      [name] 01/30/2014, 4:06 PM

## 2014-01-31 ENCOUNTER — Inpatient Hospital Stay (HOSPITAL_COMMUNITY): Payer: Medicare HMO

## 2014-01-31 DIAGNOSIS — J418 Mixed simple and mucopurulent chronic bronchitis: Secondary | ICD-10-CM

## 2014-01-31 DIAGNOSIS — I480 Paroxysmal atrial fibrillation: Secondary | ICD-10-CM | POA: Diagnosis present

## 2014-01-31 DIAGNOSIS — R414 Neurologic neglect syndrome: Secondary | ICD-10-CM

## 2014-01-31 DIAGNOSIS — G8190 Hemiplegia, unspecified affecting unspecified side: Secondary | ICD-10-CM

## 2014-01-31 DIAGNOSIS — J9602 Acute respiratory failure with hypercapnia: Secondary | ICD-10-CM

## 2014-01-31 MED ORDER — CITALOPRAM HYDROBROMIDE 20 MG PO TABS
20.0000 mg | ORAL_TABLET | Freq: Every day | ORAL | Status: DC
  Administered 2014-01-31: 20 mg via ORAL
  Filled 2014-01-31: qty 1
  Filled 2014-01-31: qty 2
  Filled 2014-01-31: qty 1

## 2014-01-31 MED ORDER — METOPROLOL TARTRATE 100 MG PO TABS
100.0000 mg | ORAL_TABLET | Freq: Two times a day (BID) | ORAL | Status: DC
  Administered 2014-01-31 (×2): 100 mg via ORAL
  Filled 2014-01-31: qty 1
  Filled 2014-01-31: qty 2
  Filled 2014-01-31 (×3): qty 1

## 2014-01-31 MED ORDER — PANTOPRAZOLE SODIUM 40 MG PO TBEC
40.0000 mg | DELAYED_RELEASE_TABLET | Freq: Every day | ORAL | Status: DC
  Administered 2014-01-31: 40 mg via ORAL
  Filled 2014-01-31: qty 1

## 2014-01-31 MED ORDER — CETYLPYRIDINIUM CHLORIDE 0.05 % MT LIQD
7.0000 mL | Freq: Two times a day (BID) | OROMUCOSAL | Status: DC
  Administered 2014-01-31 (×3): 7 mL via OROMUCOSAL

## 2014-01-31 MED ORDER — LEVOTHYROXINE SODIUM 150 MCG PO TABS
150.0000 ug | ORAL_TABLET | Freq: Every day | ORAL | Status: DC
  Filled 2014-01-31 (×3): qty 1

## 2014-01-31 MED ORDER — CLONAZEPAM 0.5 MG PO TABS
0.5000 mg | ORAL_TABLET | Freq: Every day | ORAL | Status: DC | PRN
  Administered 2014-01-31: 0.5 mg via ORAL
  Filled 2014-01-31: qty 1

## 2014-01-31 MED ORDER — LORAZEPAM 1 MG PO TABS: 1.0000 mg | ORAL_TABLET | Freq: Once | ORAL | Status: DC

## 2014-01-31 MED ORDER — ENOXAPARIN SODIUM 40 MG/0.4ML ~~LOC~~ SOLN
40.0000 mg | SUBCUTANEOUS | Status: DC
  Administered 2014-01-31 – 2014-02-05 (×5): 40 mg via SUBCUTANEOUS
  Filled 2014-01-31 (×6): qty 0.4

## 2014-01-31 MED ORDER — AMIODARONE HCL 200 MG PO TABS
400.0000 mg | ORAL_TABLET | Freq: Two times a day (BID) | ORAL | Status: DC
  Administered 2014-01-31 (×2): 400 mg via ORAL
  Filled 2014-01-31 (×5): qty 2

## 2014-01-31 MED ORDER — PRAVASTATIN SODIUM 10 MG PO TABS
10.0000 mg | ORAL_TABLET | Freq: Every day | ORAL | Status: DC
  Filled 2014-01-31 (×2): qty 1

## 2014-01-31 MED ORDER — LORAZEPAM 2 MG/ML IJ SOLN
1.0000 mg | Freq: Once | INTRAMUSCULAR | Status: AC
  Administered 2014-01-31: 1 mg via INTRAVENOUS
  Filled 2014-01-31: qty 1

## 2014-01-31 MED ORDER — ADULT MULTIVITAMIN W/MINERALS CH
1.0000 | ORAL_TABLET | Freq: Every day | ORAL | Status: DC
  Administered 2014-01-31: 1 via ORAL
  Filled 2014-01-31 (×2): qty 1

## 2014-01-31 MED ORDER — VERAPAMIL HCL ER 180 MG PO TBCR
180.0000 mg | EXTENDED_RELEASE_TABLET | Freq: Every day | ORAL | Status: DC
  Administered 2014-01-31: 180 mg via ORAL
  Filled 2014-01-31 (×2): qty 1

## 2014-01-31 MED ORDER — AMIODARONE HCL IN DEXTROSE 360-4.14 MG/200ML-% IV SOLN
30.0000 mg/h | INTRAVENOUS | Status: DC
  Filled 2014-01-31 (×2): qty 200

## 2014-01-31 NOTE — Progress Notes (Signed)
Speech Language Pathology Treatment: Dysphagia;Cognitive-Linquistic  Patient Details Name: Summer Bradley MRN: [id number] DOB: 1945-10-01 Today's Date: 01/31/2014 Time: 1008-1030 SLP Time Calculation (min) (ACUTE ONLY): 22 min  Assessment / Plan / Recommendation Clinical Impression  Pt demonstrated increased alertness and improved swallowing function as compared with ST documentation from assessment yesterday. Oral dysphagia continues with unsensed left labial residue and minimal lingual residue.  No cough, throat clearing and vocal quality clear throughout session. Moderate verbal/visual cues for emergent awareness and clearance of labial residue, clinical importance for small bites and sips and no straws. She required mod-max cues to see/attend to SLP on left visual field and provided education re: left neglect for pt and family and to move to pt's left side.  SLP recommends DYs 2 diet texture and thin liquids, no straws, pills whole in applesauce and continued ST for facilitation of swallow and cognitive goals.   HPI HPI: 68 yo WF who devloped neuro changes 01/29/14  with left side weakness that resulted in a fall. Transported via EMS to [facility] where she was taken to IR and rt mca clot removal and revascularization procedure was performed. Intubated 12/14-12/15.  Swallow eval ordered.      Pertinent Vitals Pain Assessment: No/denies pain  SLP Plan  Continue with current plan of care    Recommendations Diet recommendations: Dysphagia 2 (fine chop) Liquids provided via: Cup;No straw Medication Administration: Whole meds with puree Supervision: Patient able to self feed;Full supervision/cueing for compensatory strategies Compensations: Slow rate;Small sips/bites;Check for pocketing;Check for anterior loss Postural Changes and/or Swallow Maneuvers: Seated upright 90 degrees              Oral Care Recommendations: Oral care BID Follow up Recommendations: Inpatient Rehab Plan: Continue with  current plan of care    GO     [name] 01/31/2014, 2:50 PM   Orbie Pyo Colvin Caroli.Ed [employer] [phone]

## 2014-01-31 NOTE — Progress Notes (Signed)
Has tolerated extubation without distress. No new complaints  Filed Vitals:   01/31/14 1100 01/31/14 1200 01/31/14 1300 01/31/14 1400  BP: 123/62 137/73 143/85 139/75  Pulse: 88 62 67 70  Temp:      TempSrc:      Resp: 24 25 26  38  Height:      Weight:      SpO2: 91% 97% 84% 92%   Small abrasion around L eye No JVD Clear anteriorly No wheezes RRR s M NABS, soft Ext warm without edema MAEs  I have reviewed all of today's lab results. Relevant abnormalities are discussed in the A/P section  CXR: NNF  IMPRESSION: Acute R MCA CVA, s/p angiography, R MCA clot retrieval Acute VDRF, resolved COPD with recent exacerbation - no acute bronchospasm  Chronic O2 therapy (2 lpm NS) Chronic PAF - subtherapeutic PT-INR on admission AFRVR, resolved. NSR after amiodarone H/O hypertension Hypothyroidism, chronic Mild hyperglycemia H/O depression - chronic SSRI therapy  PLAN/REC: Stroke care per Stroke service Cont upplemental O2 to maintain SpO2 92-97% OK for transfer to telemetry bed from PCCM perspective Cont scheduled and PRN metoprolol to maintain HR < 115/min Amiodarone to be transitioned to PO 12/16 Might benefit from Cardiology eval and F/U (noting that she is scheduled to see a Cardiologist in her home town) Anticoagulation per Stroke service DM mgmt per Stroke Service Resume SSRI when able to take POs   Family updated @ bedside  PCCM will sign off. Please call if we can be of further assistance  Merton Border, MD ; [facility] [phone].  After 5:30 PM or weekends, call [phone]

## 2014-01-31 NOTE — Progress Notes (Signed)
Referring Physician(s): Stroke  Subjective: CVA R MCA clot retrieval 12/14 Pt extubated Sitting up in bed Talking to me Moving all 4s  Allergies: Lisinopril  Medications: Prior to Admission medications   Medication Sig Start Date End Date Taking? Authorizing Provider  albuterol (PROVENTIL HFA;VENTOLIN HFA) 108 (90 BASE) MCG/ACT inhaler Inhale 1 puff into the lungs every 6 (six) hours as needed for wheezing or shortness of breath.   Yes Historical Provider, MD  aspirin EC 81 MG tablet Take 81 mg by mouth daily.   Yes Historical Provider, MD  budesonide-formoterol (SYMBICORT) 160-4.5 MCG/ACT inhaler Inhale 2 puffs into the lungs 2 (two) times daily.   Yes Historical Provider, MD  CALCIUM-VITAMIN D PO Take 1 tablet by mouth daily.   Yes Historical Provider, MD  citalopram (CELEXA) 20 MG tablet Take 20 mg by mouth at bedtime.  01/22/14  Yes Historical Provider, MD  clonazePAM (KLONOPIN) 0.5 MG tablet Take 0.5 mg by mouth daily as needed for anxiety.  12/20/13  Yes Historical Provider, MD  fluticasone (FLONASE) 50 MCG/ACT nasal spray Place 1 spray into both nostrils daily as needed for allergies.  12/06/13  Yes Historical Provider, MD  furosemide (LASIX) 20 MG tablet Take 20 mg by mouth daily.  11/06/13  Yes Historical Provider, MD  ipratropium (ATROVENT HFA) 17 MCG/ACT inhaler Inhale 2 puffs into the lungs 4 (four) times daily.   Yes Historical Provider, MD  levothyroxine (SYNTHROID, LEVOTHROID) 150 MCG tablet Take 150 mcg by mouth daily before breakfast.  01/09/14  Yes Historical Provider, MD  lovastatin (MEVACOR) 10 MG tablet Take 10 mg by mouth daily.  11/06/13  Yes Historical Provider, MD  metoprolol (LOPRESSOR) 100 MG tablet Take 100 mg by mouth 2 (two) times daily.  01/15/14  Yes Historical Provider, MD  Multiple Vitamin (MULTIVITAMIN WITH MINERALS) TABS tablet Take 1 tablet by mouth daily.   Yes Historical Provider, MD  omeprazole (PRILOSEC) 20 MG capsule Take 20 mg by mouth daily.   11/06/13  Yes Historical Provider, MD  verapamil (CALAN-SR) 180 MG CR tablet Take 180 mg by mouth daily.  01/15/14  Yes Historical Provider, MD  warfarin (COUMADIN) 1 MG tablet Take 2.5 mg by mouth daily.  01/15/14  Yes Historical Provider, MD    Review of Systems  Vital Signs: BP 138/67 mmHg  Pulse 84  Temp(Src) 98.1 F (36.7 C) (Oral)  Resp 38  Ht 5\' 2"  (1.575 m)  Wt 87.2 kg (192 lb 3.9 oz)  BMI 35.15 kg/m2  SpO2 95%  Physical Exam  Neurological:  Alert Oriented Talking to me Answers all questions appropriately L facial abrasion- beneath eye Moving all 4s L weaker than right Rt groin clean and dry; No bleeding    Imaging: Ct Head Wo Contrast  01/30/2014   CLINICAL DATA:  68 year old female status post right MCA infarct and intra-arterial intervention. Initial encounter.  EXAM: CT HEAD WITHOUT CONTRAST  TECHNIQUE: Contiguous axial images were obtained from the base of the skull through the vertex without intravenous contrast.  COMPARISON:  01/29/2014 and earlier.  FINDINGS: Intubated. Small volume fluid in the paranasal sinuses. Mild right mastoid effusion. No acute osseous abnormality identified. Left face and scalp broad-based soft tissue hematoma. Visualized orbit soft tissues are within normal limits.  Calcified atherosclerosis at the skull base. Interval regression of patchy hyperdensity in the posterior right MCA territory. Mild parenchymal hypodensity in the same area compared to the presentation study at 0614 hr on 01/29/2014. No regional mass effect. Superimposed chronic  appearing anterior and posterior MCA division infarcts.  Stable parafalcine small volume subdural hematoma. No new intracranial hemorrhage. No ventriculomegaly. No acute midline shift or intracranial mass effect. Stable small chronic infarct in the inferior right cerebellum. Stable cerebral white matter hypodensity elsewhere. Decreased conspicuity of hyperdensity at the right MCA bifurcation. Stable  intracranial vascular hyperdensity elsewhere.  IMPRESSION: 1. Resolved right MCA territory hyperdensity present at 1056 hrs yesterday, most likely was post Neurointervention parenchymal contrast staining. 2. Mild cytotoxic edema suspected in that same right MCA territory. Underlying chronic ischemic changes. 3. Stable parafalcine subdural hematoma. No new intracranial hemorrhage or acute mass effect. 4. No new intracranial abnormality.   Electronically Signed   By: Lars Pinks M.D.   On: 01/30/2014 07:34   Ct Head Wo Contrast  01/29/2014   CLINICAL DATA:  Right MCA clot retrieval.  Stroke.  EXAM: CT HEAD WITHOUT CONTRAST  TECHNIQUE: Contiguous axial images were obtained from the base of the skull through the vertex without intravenous contrast.  COMPARISON:  CT 01/29/2014  FINDINGS: Small interhemispheric subdural hemorrhage unchanged.  Acute infarct in the right MCA territory. This study was done following angiography and clot retrieval. There is vascular contrast present. There is high density in the right temporoparietal cortex which may be due to contrast staining from angiography versus hemorrhage. Continued follow-up recommended. No significant mass effect or midline shift. Chronic infarcts in the right frontal temporal lobe and right parietal lobe as noted previously.  Ventricle size is normal. No shift of the midline structures. Calvarium is intact.  IMPRESSION: Acute infarct right MCA territory as noted on CT perfusion study. There is interval development of high density in the right temporoparietal cortex which may be due to contrast material or hemorrhage. Continued follow-up recommended.  Small interhemispheric subdural hematoma is unchanged.   Electronically Signed   By: Franchot Gallo M.D.   On: 01/29/2014 11:22   Ct Head Wo Contrast  01/29/2014   CLINICAL DATA:  Code stroke.  Left-sided flaccid.  EXAM: CT HEAD WITHOUT CONTRAST  TECHNIQUE: Contiguous axial images were obtained from the base of  the skull through the vertex without intravenous contrast.  COMPARISON:  None.  FINDINGS: Skull and Sinuses:There is a contusion of the left frontal scalp without underlying fracture.  Sinuses and mastoids are clear.  Orbits: No acute abnormality.  Brain: High-density thickening of the posterior falx up to 4 mm, most consistent with subdural hemorrhage. No related mass effect.  There is discrete cortical and subcortical low-attenuation in the anterior and posterior aspect of the right MCA territory, appearance favoring gliosis rather than cytotoxic edema. No definitive acute infarct. Subtle high attenuation of the right M2 segment relative to the left.  Critical Value/emergent results were called by telephone at the time of interpretation on 01/29/2014 at 6:34 am to Dr. Leonel Ramsay, who verbally acknowledged these results.  IMPRESSION: 1. 4 mm thick posterior falx subdural hemorrhage. 2. Prominent density at the right M1/M2 junction. CTA is already planned. 3. Established infarcts in the anterior and posterior right MCA territory. No definitive acute infarct. 4. Left frontal scalp contusion.  No acute fracture.   Electronically Signed   By: Jorje Guild M.D.   On: 01/29/2014 06:38   Ir Thrombect Prim Mech Init (inclu) Mod Sed  01/31/2014   CLINICAL DATA:  Left-sided weakness. Right-sided gaze preference. Large area of penumbra on CT perfusion maps.  EXAM: IR ANGIO INTRA EXTRACRAN SEL INTERNAL CAROTID UNI RIGHT MOD SED AND BILATERAL COMMON CAROTID ARTERIOGRAMS FOLLOWED  BY ENDOVASCULAR COMPLETE REVASCULARIZATION OF OCCLUDED RIGHT MIDDLE CEREBRAL ARTERY USING SUPER SELECTIVE INTRACRANIAL INTRA-ARTERIAL 10 MG OF INTEGRILIN AND 2 PASSES WITH THE SOLITAIRE FR 4 MM X 40 MM STENT RETRIEVAL DEVICE. RIGHT VERTEBRAL ARTERY ANGIOGRAM.  ANESTHESIA/SEDATION: General anesthesia.  MEDICATIONS: As per general anesthesia.  CONTRAST:  119mL OMNIPAQUE IOHEXOL 300 MG/ML  SOLN  PROCEDURE: Following a full explanation of the  procedure along with the potential associated complications, an informed witnessed consent was obtained from the patient and her daughter.  The patient was put under general anesthesia by the Department of Anesthesiology at [facility].  The right groin was prepped and draped in the usual sterile fashion. Thereafter using modified Seldinger technique, transfemoral access into the right common femoral artery was obtained without difficulty. Over a 0.035 inch guidewire, a 5 French Pinnacle sheath was inserted. Through this, and also over a 0.035 inch guidewire a 5 French JB1 catheter was advanced to the aortic arch region and selectively positioned in the innominate artery, the right common carotid artery and left common carotid artery. There were no acute complications.  COMPLICATIONS: None immediate.  FINDINGS: The right vertebral artery origin is normal.  The vessel is seen to opacify normally to the cranial skull base to opacify the right vertebrobasilar junction and the right posterior inferior cerebellar artery.  The right common carotid arteriogram demonstrates the right external carotid artery and its major branches to be widely patent.  The right internal carotid artery at the bulb and its proximal one-third is normal.  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  More distally the vessel assumes normal caliber to the cranial skull base. The petrous, cavernous and supraclinoid segments are widely patent.  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  Patency of the lateral and medial lenticulostriates and of the recurrent artery of Heubner is noted.  The right anterior cerebral artery is seen to opacify into the capillary and venous phases.  Cross filling via the anterior communicating artery of the left anterior cerebral artery A2  segment is also seen. The left common carotid arteriogram injection which was performed at the end of the intervention demonstrates the left external carotid artery and its major branches to be widely patent.  The left internal carotid artery at the bulb to the cranial skull base opacifies normally.  The petrous, the cavernous and the supraclinoid segments are widely patent.  Noted in the left internal carotid artery supraclinoid segment just at the origin of the ophthalmic artery is a saccular outpouching projecting posteriorly slightly inferiorly. This measures approximately 7 mm x 4.5 mm, and is suggestive of an intracranial aneurysm.  The left middle and the left anterior cerebral arteries opacify normally into the capillary and venous phases. Cross filling via the anterior communicating artery of the right anterior cerebral artery A2 segment is seen. Opacification of the A1 segment is also seen.  ENDOVASCULAR COMPLETE REVASCULARIZATION OF OCCLUDED RIGHT MIDDLE CEREBRAL ARTERY M1 SEGMENT USING SUPERSELECTIVE INTRACRANIAL INTRA-ARTERIAL 10 MG OF INTEGRELIN AND 2 PASSES WITH THE SOLITAIRE FR 4 MM X 40 MM STENT RETRIEVAL DEVICE:  The diagnostic JB1 catheter in the right common carotid artery was exchanged over a 0.035 inch 300 cm Rosen exchange guidewire for an 80 cm 8 French Arrow sheath using biplane roadmap technique and constant fluoroscopic guidance. Good aspiration was obtained from the side port  of the Arrow neurovascular sheath. A gentle contrast injection demonstrated no evidence of spasms, dissections or of intraluminal filling defects.  This was then connected to continuous heparinized saline infusion.  Over the [employer] guidewire, an 8 French 85 cm Flowgate balloon guide catheter was then advanced and positioned just proximal to the right common carotid bifurcation. The balloon had been prepped with 50% contrast and 50% heparinized saline infusion.  Good aspiration was obtained from the hub of the  8 [place] Flowgate balloon guide catheter. A gentle contrast injection demonstrated no evidence of spasms, dissections or of intraluminal filling defects.  Over a 0.035 inch Roadrunner guidewire using biplane roadmap technique and constant fluoroscopic guidance, the 8 [place] Flowgate balloon guide catheter was advanced into the proximal one-third of the right internal carotid artery. Again the guidewire was removed. Good aspiration was obtained from the hub of the 8 [place] Flowgate balloon guide catheter.  A gentle contrast injection demonstrated free flow without evidence of spasms, dissections or of intraluminal filling defects.  A control arteriogram performed centered intracranially continued to demonstrate no change in the occluded right middle cerebral artery M1 segment.  At this time in a coaxial manner and with constant heparinized saline infusion using biplane roadmap technique and constant fluoroscopic guidance, a combination of an 18 L Merci micro catheter which had been steamed-shaped was advanced with a DAC 125 catheter over a 0.0141 Softip Synchro micro guidewire to the distal end of the 8 [place] Flowgate guide catheter.  With the micro guidewire leading with a J-tip configuration, the combination of the micro catheter and a DAC 125 catheter was then advanced using a torque device to the supraclinoid right ICA. The right M1 segment was then entered with the micro guidewire and advanced into the distal inferior M3 region of the inferior division of the right middle cerebral artery followed by the micro catheter. The guidewire was removed. Good aspiration was obtained from the hub of the Merci micro catheter. Approximately 4 mg of superselective intracranial Integrilin was then infused over approximately 2 minutes. Thereafter, a 4 mm x 40 mm Solitaire FR stent retrieval device was then advanced in a coaxial manner and with constant heparinized saline infusion using biplane roadmap technique and constant  fluoroscopic guidance to the distal end of the 18 L micro catheter.  The O-rings on the delivery micro catheter and the delivery micro guidewire were then loosened.  With slight forward gentle traction with the right hand on the delivery micro guidewire, with the left hand the delivery micro catheter was then retrieved unsheathing the entire retrieval device. The device was left deployed for approximately 3 minutes.  A control arteriogram performed through the 8 [place] Flowgate guide catheter demonstrated antegrade flow into the right MCA distribution with focal filling defects within the stent retrieval representative of clot.  At this juncture another 2 mg of superselective Integrilin was then infused over about a minute.  The balloon at the distal end of the Flowgate guide catheter and the right internal carotid artery was then inflated for proximal flow arrest. The proximal aspect of the retrieval device was then captured into the micro catheter.  With constant aspiration being applied at the hub of the 8 [place] Flowgate guide catheter via the side port of the Tuohy Meraux, the combination of the retrieval device, the [facility] 125 catheter and the Merci micro catheter were then retrieved and removed as the aspiration was continued. Aspiration with a 60 mL syringe was continued as the balloon  was then deflated in the right internal carotid artery. The aspirate did not reveal any evidence of a clot. Back bleed at the hub of the 8 [place] Flowgate guide catheter was brisk.  A control arteriogram performed through the 8 [place] Flowgate guide catheter demonstrated continued occlusion of the right middle cerebral artery M1 segment with slightly improved flow.  This prompted a second pass with the advancement of a combination of the 18 L Merci micro catheter with a DAC 125 catheter over a 0.014 inch Softip Synchro micro guidewire to the distal end passed the clot into the M3 region of the inferior division of the right middle  cerebral artery.  After having a safe position of the tip of the micro catheter, in a coaxial manner and with constant heparinized saline infusion using biplane roadmap technique and constant fluoroscopic guidance, a 4 mm x 40 mm Solitaire FR stent retrieval device was then advanced and positioned at the distal end of the 18 L Merci micro catheter. The stent was then again deployed by loosening the O-rings at the delivery micro guidewire and the delivery micro catheter with slight forward gentle traction with the right hand on the delivery micro guidewire as the retrieval device was unsheathed. Again this was left deployed for approximately 3 minutes. Another 2 mg of intra-arterial Integrilin was then infused super selectively.  At the end of this, the retrieval device was then captured into the micro catheter. Flow arrest was initiated by inflating the balloon at the proximal one-third of the right internal carotid artery.  The combination of the retrieval device, the proximal aspect which had been captured into the micro catheter was then retrieved as constant aspiration was applied with a 60 mL syringe at the hub of the 8 [place] Flowgate guide catheter. The aspiration was continued as the combination was retrieved and removed. There was a blockage with difficulty in aspirating from the guide catheter. The aspiration was continued as the balloon was then deflated and the 8 [place] guide catheter was retrieved and removed. At this time, the aspirate revealed a 2.5 mm x 7.5 mm clot.  A JB1 catheter was then advanced into the right common carotid artery. A control arteriogram performed demonstrated complete angiographic revascularization of the right middle cerebral artery with flash filling of the right A1 segment proximally.  Also noted was mild caliber irregularity at the right internal carotid artery in its mid segment at the site of the double U configuration causes this and the slight irregularity of the wall.  Approximately 2 more mg of superselective Integrilin was then infused. This was suspicious of a small focal dissection non flow limiting.  The diagnostic JB1 catheter was then advanced into the left common carotid artery. An arteriogram performed centered intracranially demonstrated free flow into the left internal carotid artery, the left middle cerebral artery and the left anterior cerebral artery with flow cross-filling via the anterior communicating artery of the left A1 and A2 segments.  No change was seen in the suspected aneurysm intracranially of the posterior wall of the left internal carotid artery. Throughout the procedure the patient's neurological status and hemodynamic status remained stable. No extravasation of contrast was noted at any time. The Arrow neurovascular sheath was then retrieved into the abdominal aorta and exchanged over a J-tip guidewire for a 9 French Pinnacle sheath. This was then connected to continuous heparinized saline infusion. The patient was then transferred to the CT scanner for postprocedural CT scan of the brain.  IMPRESSION:  Status post endovascular complete revascularization of occluded right middle cerebral artery mid M1 segment using approximately 10 mg of superselective intracranial intra-arterial Integrilin and two passes with the Solitaire FR 4 mm x 40 mm stent retrieval device.  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   Electronically Signed   By: Luanne Bras M.D.   On: 01/30/2014 09:44   Ct Cerebral Perfusion W/cm  01/29/2014   CLINICAL DATA:  Code stroke. Left-sided weakness. Last normal 8 p.m.  EXAM: CT CEREBRAL PERFUSION WITH CONTRAST  TECHNIQUE: Serial imaging through the cerebral hemispheres was performed after bolus administration of 40 cc Omnipaque 350 intravenous. Coverage extended from the level of the  temporal poles nearly to the vertex.  CONTRAST:  28mL OMNIPAQUE IOHEXOL 350 MG/ML SOLN  COMPARISON:  Head CT from earlier the same day.  FINDINGS: Source images notable for a thin hematoma along the posterior falx, noted on initial noncontrast examination.  There is cut off involving the right M1-M2 junction, consistent with thrombus, with oligemia throughout the right MCA territory. There is gradual enhancement of vessels over the right cerebral convexity via pial/pial collaterals, a favorable finding.  Elevated mean transit time densely in the right MCA territory, with partial sparing of the temporal lobe. Although there is clear decrease in cerebral blood volume at the core of the right MCA territory, there is peripheral blood volume sparing compatible with penumbra. Penumbra is preset in the region of the motor strip and of the posterior temporal lobe. Dense perfusion defects at the extreme anterior and posterior margins of the right MCA territory, consistent with remote infarcts and gliosis.  Critical Value/emergent results were called by telephone at the time of interpretation on 01/29/2014 at 655 am to Dr. Roland Rack , who verbally acknowledged these results.  IMPRESSION: 1. Occlusive thrombus at the right M1-M2 junction. Subjectively good pial/pial collateral circulation. 2. Perfusion findings suggest moderate acute infarct in the central right MCA territory with rim of penumbra, as above. 3. Remote infarcts at the anterior and posterior margins of the right MCA territory. 4. Thin posterior falx subdural hemorrhage.   Electronically Signed   By: Jorje Guild M.D.   On: 01/29/2014 07:31   Dg Chest Port 1 View  01/30/2014   CLINICAL DATA:  Respiratory failure; history of COPD and atrial fibrillation and previous CVA ; past year history of tobacco use  EXAM: PORTABLE CHEST - 1 VIEW  COMPARISON:  Portable chest x-ray of January 29, 2014  FINDINGS: The endotracheal tube tip lies 5.2 cm above the  crotch of the carina. The lungs are well-expanded. There is bibasilar subsegmental atelectasis which is more conspicuous today. The underline basilar interstitial markings remain increased. The hemidiaphragms are partially obscured today. There is no pneumothorax nor large pleural effusion. The cardiac silhouette is mildly enlarged though stable. The pulmonary vascularity is not engorged. The bony thorax exhibits no acute abnormalities.  IMPRESSION: COPD with bibasilar scarring with superimposed subsegmental atelectasis. There is no evidence of CHF.   Electronically Signed   By: David  Martinique   On: 01/30/2014 08:10   Dg Chest Port 1 View  01/29/2014   CLINICAL DATA:  Respiratory failure.  EXAM: PORTABLE CHEST - 1 VIEW  COMPARISON:  01/10/2014  FINDINGS: Endotracheal tube is present with tip 5.2 cm above the carina. Patient is slightly rotated to the left. There is mild prominence  of the perihilar markings worse over the infrahilar regions likely mild interstitial edema and less likely infection. Mild stable cardiomegaly. Remainder of the exam is unchanged.  IMPRESSION: Bilateral perihilar/infrahilar opacification likely interstitial edema and less likely infection.  Endotracheal tube with tip 5.2 cm above the carina.   Electronically Signed   By: Marin Olp M.D.   On: 01/29/2014 13:24   Ir Angio Intra Extracran Sel Com Carotid Innominate Uni L Mod Sed  01/31/2014   CLINICAL DATA:  Left-sided weakness. Right-sided gaze preference. Large area of penumbra on CT perfusion maps.  EXAM: IR ANGIO INTRA EXTRACRAN SEL INTERNAL CAROTID UNI RIGHT MOD SED AND BILATERAL COMMON CAROTID ARTERIOGRAMS FOLLOWED BY ENDOVASCULAR COMPLETE REVASCULARIZATION OF OCCLUDED RIGHT MIDDLE CEREBRAL ARTERY USING SUPER SELECTIVE INTRACRANIAL INTRA-ARTERIAL 10 MG OF INTEGRILIN AND 2 PASSES WITH THE SOLITAIRE FR 4 MM X 40 MM STENT RETRIEVAL DEVICE. RIGHT VERTEBRAL ARTERY ANGIOGRAM.  ANESTHESIA/SEDATION: General anesthesia.  MEDICATIONS:  As per general anesthesia.  CONTRAST:  159mL OMNIPAQUE IOHEXOL 300 MG/ML  SOLN  PROCEDURE: Following a full explanation of the procedure along with the potential associated complications, an informed witnessed consent was obtained from the patient and her daughter.  The patient was put under general anesthesia by the Department of Anesthesiology at [facility].  The right groin was prepped and draped in the usual sterile fashion. Thereafter using modified Seldinger technique, transfemoral access into the right common femoral artery was obtained without difficulty. Over a 0.035 inch guidewire, a 5 French Pinnacle sheath was inserted. Through this, and also over a 0.035 inch guidewire a 5 French JB1 catheter was advanced to the aortic arch region and selectively positioned in the innominate artery, the right common carotid artery and left common carotid artery. There were no acute complications.  COMPLICATIONS: None immediate.  FINDINGS: The right vertebral artery origin is normal.  The vessel is seen to opacify normally to the cranial skull base to opacify the right vertebrobasilar junction and the right posterior inferior cerebellar artery.  The right common carotid arteriogram demonstrates the right external carotid artery and its major branches to be widely patent.  The right internal carotid artery at the bulb and its proximal one-third is normal.  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  More distally the vessel assumes normal caliber to the cranial skull base. The petrous, cavernous and supraclinoid segments are widely patent.  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  Patency of the lateral and medial lenticulostriates and of the recurrent artery of Heubner is noted.  The right anterior cerebral artery is seen to opacify into  the capillary and venous phases.  Cross filling via the anterior communicating artery of the left anterior cerebral artery A2 segment is also seen. The left common carotid arteriogram injection which was performed at the end of the intervention demonstrates the left external carotid artery and its major branches to be widely patent.  The left internal carotid artery at the bulb to the cranial skull base opacifies normally.  The petrous, the cavernous and the supraclinoid segments are widely patent.  Noted in the left internal carotid artery supraclinoid segment just at the origin of the ophthalmic artery is a saccular outpouching projecting posteriorly slightly inferiorly. This measures approximately 7 mm x 4.5 mm, and is suggestive of an intracranial aneurysm.  The left middle and the left  anterior cerebral arteries opacify normally into the capillary and venous phases. Cross filling via the anterior communicating artery of the right anterior cerebral artery A2 segment is seen. Opacification of the A1 segment is also seen.  ENDOVASCULAR COMPLETE REVASCULARIZATION OF OCCLUDED RIGHT MIDDLE CEREBRAL ARTERY M1 SEGMENT USING SUPERSELECTIVE INTRACRANIAL INTRA-ARTERIAL 10 MG OF INTEGRELIN AND 2 PASSES WITH THE SOLITAIRE FR 4 MM X 40 MM STENT RETRIEVAL DEVICE:  The diagnostic JB1 catheter in the right common carotid artery was exchanged over a 0.035 inch 300 cm Rosen exchange guidewire for an 80 cm 8 French Arrow sheath using biplane roadmap technique and constant fluoroscopic guidance. Good aspiration was obtained from the side port of the Arrow neurovascular sheath. A gentle contrast injection demonstrated no evidence of spasms, dissections or of intraluminal filling defects.  This was then connected to continuous heparinized saline infusion.  Over the [employer] guidewire, an 8 French 85 cm Flowgate balloon guide catheter was then advanced and positioned just proximal to the right common carotid bifurcation. The  balloon had been prepped with 50% contrast and 50% heparinized saline infusion.  Good aspiration was obtained from the hub of the 8 [place] Flowgate balloon guide catheter. A gentle contrast injection demonstrated no evidence of spasms, dissections or of intraluminal filling defects.  Over a 0.035 inch Roadrunner guidewire using biplane roadmap technique and constant fluoroscopic guidance, the 8 [place] Flowgate balloon guide catheter was advanced into the proximal one-third of the right internal carotid artery. Again the guidewire was removed. Good aspiration was obtained from the hub of the 8 [place] Flowgate balloon guide catheter.  A gentle contrast injection demonstrated free flow without evidence of spasms, dissections or of intraluminal filling defects.  A control arteriogram performed centered intracranially continued to demonstrate no change in the occluded right middle cerebral artery M1 segment.  At this time in a coaxial manner and with constant heparinized saline infusion using biplane roadmap technique and constant fluoroscopic guidance, a combination of an 18 L Merci micro catheter which had been steamed-shaped was advanced with a DAC 125 catheter over a 0.0141 Softip Synchro micro guidewire to the distal end of the 8 [place] Flowgate guide catheter.  With the micro guidewire leading with a J-tip configuration, the combination of the micro catheter and a DAC 125 catheter was then advanced using a torque device to the supraclinoid right ICA. The right M1 segment was then entered with the micro guidewire and advanced into the distal inferior M3 region of the inferior division of the right middle cerebral artery followed by the micro catheter. The guidewire was removed. Good aspiration was obtained from the hub of the Merci micro catheter. Approximately 4 mg of superselective intracranial Integrilin was then infused over approximately 2 minutes. Thereafter, a 4 mm x 40 mm Solitaire FR stent retrieval device  was then advanced in a coaxial manner and with constant heparinized saline infusion using biplane roadmap technique and constant fluoroscopic guidance to the distal end of the 18 L micro catheter.  The O-rings on the delivery micro catheter and the delivery micro guidewire were then loosened.  With slight forward gentle traction with the right hand on the delivery micro guidewire, with the left hand the delivery micro catheter was then retrieved unsheathing the entire retrieval device. The device was left deployed for approximately 3 minutes.  A control arteriogram performed through the 8 [place] Flowgate guide catheter demonstrated antegrade flow into the right MCA distribution with focal filling defects within the stent retrieval representative of  clot.  At this juncture another 2 mg of superselective Integrilin was then infused over about a minute.  The balloon at the distal end of the Flowgate guide catheter and the right internal carotid artery was then inflated for proximal flow arrest. The proximal aspect of the retrieval device was then captured into the micro catheter.  With constant aspiration being applied at the hub of the 8 [place] Flowgate guide catheter via the side port of the Tuohy Kemp, the combination of the retrieval device, the [facility] 125 catheter and the Merci micro catheter were then retrieved and removed as the aspiration was continued. Aspiration with a 60 mL syringe was continued as the balloon was then deflated in the right internal carotid artery. The aspirate did not reveal any evidence of a clot. Back bleed at the hub of the 8 [place] Flowgate guide catheter was brisk.  A control arteriogram performed through the 8 [place] Flowgate guide catheter demonstrated continued occlusion of the right middle cerebral artery M1 segment with slightly improved flow.  This prompted a second pass with the advancement of a combination of the 18 L Merci micro catheter with a DAC 125 catheter over a 0.014 inch  Softip Synchro micro guidewire to the distal end passed the clot into the M3 region of the inferior division of the right middle cerebral artery.  After having a safe position of the tip of the micro catheter, in a coaxial manner and with constant heparinized saline infusion using biplane roadmap technique and constant fluoroscopic guidance, a 4 mm x 40 mm Solitaire FR stent retrieval device was then advanced and positioned at the distal end of the 18 L Merci micro catheter. The stent was then again deployed by loosening the O-rings at the delivery micro guidewire and the delivery micro catheter with slight forward gentle traction with the right hand on the delivery micro guidewire as the retrieval device was unsheathed. Again this was left deployed for approximately 3 minutes. Another 2 mg of intra-arterial Integrilin was then infused super selectively.  At the end of this, the retrieval device was then captured into the micro catheter. Flow arrest was initiated by inflating the balloon at the proximal one-third of the right internal carotid artery.  The combination of the retrieval device, the proximal aspect which had been captured into the micro catheter was then retrieved as constant aspiration was applied with a 60 mL syringe at the hub of the 8 [place] Flowgate guide catheter. The aspiration was continued as the combination was retrieved and removed. There was a blockage with difficulty in aspirating from the guide catheter. The aspiration was continued as the balloon was then deflated and the 8 [place] guide catheter was retrieved and removed. At this time, the aspirate revealed a 2.5 mm x 7.5 mm clot.  A JB1 catheter was then advanced into the right common carotid artery. A control arteriogram performed demonstrated complete angiographic revascularization of the right middle cerebral artery with flash filling of the right A1 segment proximally.  Also noted was mild caliber irregularity at the right internal  carotid artery in its mid segment at the site of the double U configuration causes this and the slight irregularity of the wall. Approximately 2 more mg of superselective Integrilin was then infused. This was suspicious of a small focal dissection non flow limiting.  The diagnostic JB1 catheter was then advanced into the left common carotid artery. An arteriogram performed centered intracranially demonstrated free flow into the left internal carotid artery,  the left middle cerebral artery and the left anterior cerebral artery with flow cross-filling via the anterior communicating artery of the left A1 and A2 segments.  No change was seen in the suspected aneurysm intracranially of the posterior wall of the left internal carotid artery. Throughout the procedure the patient's neurological status and hemodynamic status remained stable. No extravasation of contrast was noted at any time. The Arrow neurovascular sheath was then retrieved into the abdominal aorta and exchanged over a J-tip guidewire for a 9 French Pinnacle sheath. This was then connected to continuous heparinized saline infusion. The patient was then transferred to the CT scanner for postprocedural CT scan of the brain.  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   Electronically Signed   By: Luanne Bras M.D.   On: 01/30/2014 09:44   Ir Angio Intra Extracran Sel Internal Carotid Uni R Mod Sed  01/31/2014   CLINICAL DATA:  Left-sided weakness. Right-sided gaze preference. Large area of penumbra on CT perfusion maps.  EXAM: IR ANGIO INTRA  EXTRACRAN SEL INTERNAL CAROTID UNI RIGHT MOD SED AND BILATERAL COMMON CAROTID ARTERIOGRAMS FOLLOWED BY ENDOVASCULAR COMPLETE REVASCULARIZATION OF OCCLUDED RIGHT MIDDLE CEREBRAL ARTERY USING SUPER SELECTIVE INTRACRANIAL INTRA-ARTERIAL 10 MG OF INTEGRILIN AND 2 PASSES WITH THE SOLITAIRE FR 4 MM X 40 MM STENT RETRIEVAL DEVICE. RIGHT VERTEBRAL ARTERY ANGIOGRAM.  ANESTHESIA/SEDATION: General anesthesia.  MEDICATIONS: As per general anesthesia.  CONTRAST:  142mL OMNIPAQUE IOHEXOL 300 MG/ML  SOLN  PROCEDURE: Following a full explanation of the procedure along with the potential associated complications, an informed witnessed consent was obtained from the patient and her daughter.  The patient was put under general anesthesia by the Department of Anesthesiology at [facility].  The right groin was prepped and draped in the usual sterile fashion. Thereafter using modified Seldinger technique, transfemoral access into the right common femoral artery was obtained without difficulty. Over a 0.035 inch guidewire, a 5 French Pinnacle sheath was inserted. Through this, and also over a 0.035 inch guidewire a 5 French JB1 catheter was advanced to the aortic arch region and selectively positioned in the innominate artery, the right common carotid artery and left common carotid artery. There were no acute complications.  COMPLICATIONS: None immediate.  FINDINGS: The right vertebral artery origin is normal.  The vessel is seen to opacify normally to the cranial skull base to opacify the right vertebrobasilar junction and the right posterior inferior cerebellar artery.  The right common carotid arteriogram demonstrates the right external carotid artery and its major branches to be widely patent.  The right internal carotid artery at the bulb and its proximal one-third is normal.  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  More distally the vessel  assumes normal caliber to the cranial skull base. The petrous, cavernous and supraclinoid segments are widely patent.  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  Patency of the lateral and medial lenticulostriates and of the recurrent artery of Heubner is noted.  The right anterior cerebral  artery is seen to opacify into the capillary and venous phases.  Cross filling via the anterior communicating artery of the left anterior cerebral artery A2 segment is also seen. The left common carotid arteriogram injection which was performed at the end of the intervention demonstrates the left external carotid artery and its major branches to be widely patent.  The left internal carotid artery at the bulb to the cranial skull base opacifies normally.  The petrous, the cavernous and the supraclinoid segments are widely patent.  Noted in the left internal carotid artery supraclinoid segment just at the origin of the ophthalmic artery is a saccular outpouching projecting posteriorly slightly inferiorly. This measures approximately 7 mm x 4.5 mm, and is suggestive of an intracranial aneurysm.  The left middle and the left anterior cerebral arteries opacify normally into the capillary and venous phases. Cross filling via the anterior communicating artery of the right anterior cerebral artery A2 segment is seen. Opacification of the A1 segment is also seen.  ENDOVASCULAR COMPLETE REVASCULARIZATION OF OCCLUDED RIGHT MIDDLE CEREBRAL ARTERY M1 SEGMENT USING SUPERSELECTIVE INTRACRANIAL INTRA-ARTERIAL 10 MG OF INTEGRELIN AND 2 PASSES WITH THE SOLITAIRE FR 4 MM X 40 MM STENT RETRIEVAL DEVICE:  The diagnostic JB1 catheter in the right common carotid artery was exchanged over a 0.035 inch 300 cm Rosen exchange guidewire for an 80 cm 8 French Arrow sheath using biplane roadmap technique and constant fluoroscopic guidance. Good aspiration was  obtained from the side port of the Arrow neurovascular sheath. A gentle contrast injection demonstrated no evidence of spasms, dissections or of intraluminal filling defects.  This was then connected to continuous heparinized saline infusion.  Over the [employer] guidewire, an 8 French 85 cm Flowgate balloon guide catheter was then advanced and positioned just proximal to the right common carotid bifurcation. The balloon had been prepped with 50% contrast and 50% heparinized saline infusion.  Good aspiration was obtained from the hub of the 8 [place] Flowgate balloon guide catheter. A gentle contrast injection demonstrated no evidence of spasms, dissections or of intraluminal filling defects.  Over a 0.035 inch Roadrunner guidewire using biplane roadmap technique and constant fluoroscopic guidance, the 8 [place] Flowgate balloon guide catheter was advanced into the proximal one-third of the right internal carotid artery. Again the guidewire was removed. Good aspiration was obtained from the hub of the 8 [place] Flowgate balloon guide catheter.  A gentle contrast injection demonstrated free flow without evidence of spasms, dissections or of intraluminal filling defects.  A control arteriogram performed centered intracranially continued to demonstrate no change in the occluded right middle cerebral artery M1 segment.  At this time in a coaxial manner and with constant heparinized saline infusion using biplane roadmap technique and constant fluoroscopic guidance, a combination of an 18 L Merci micro catheter which had been steamed-shaped was advanced with a DAC 125 catheter over a 0.0141 Softip Synchro micro guidewire to the distal end of the 8 [place] Flowgate guide catheter.  With the micro guidewire leading with a J-tip configuration, the combination of the micro catheter and a DAC 125 catheter was then advanced using a torque device to the supraclinoid right ICA. The right M1 segment was then entered with the micro  guidewire and advanced into the distal inferior M3 region of the inferior division of the right middle cerebral artery followed by the micro catheter. The guidewire was removed. Good aspiration was obtained from the hub of the Merci micro catheter. Approximately 4 mg of superselective intracranial Integrilin was  then infused over approximately 2 minutes. Thereafter, a 4 mm x 40 mm Solitaire FR stent retrieval device was then advanced in a coaxial manner and with constant heparinized saline infusion using biplane roadmap technique and constant fluoroscopic guidance to the distal end of the 18 L micro catheter.  The O-rings on the delivery micro catheter and the delivery micro guidewire were then loosened.  With slight forward gentle traction with the right hand on the delivery micro guidewire, with the left hand the delivery micro catheter was then retrieved unsheathing the entire retrieval device. The device was left deployed for approximately 3 minutes.  A control arteriogram performed through the 8 [place] Flowgate guide catheter demonstrated antegrade flow into the right MCA distribution with focal filling defects within the stent retrieval representative of clot.  At this juncture another 2 mg of superselective Integrilin was then infused over about a minute.  The balloon at the distal end of the Flowgate guide catheter and the right internal carotid artery was then inflated for proximal flow arrest. The proximal aspect of the retrieval device was then captured into the micro catheter.  With constant aspiration being applied at the hub of the 8 [place] Flowgate guide catheter via the side port of the Tuohy [place], the combination of the retrieval device, the [facility] 125 catheter and the Merci micro catheter were then retrieved and removed as the aspiration was continued. Aspiration with a 60 mL syringe was continued as the balloon was then deflated in the right internal carotid artery. The aspirate did not reveal any  evidence of a clot. Back bleed at the hub of the 8 [place] Flowgate guide catheter was brisk.  A control arteriogram performed through the 8 [place] Flowgate guide catheter demonstrated continued occlusion of the right middle cerebral artery M1 segment with slightly improved flow.  This prompted a second pass with the advancement of a combination of the 18 L Merci micro catheter with a DAC 125 catheter over a 0.014 inch Softip Synchro micro guidewire to the distal end passed the clot into the M3 region of the inferior division of the right middle cerebral artery.  After having a safe position of the tip of the micro catheter, in a coaxial manner and with constant heparinized saline infusion using biplane roadmap technique and constant fluoroscopic guidance, a 4 mm x 40 mm Solitaire FR stent retrieval device was then advanced and positioned at the distal end of the 18 L Merci micro catheter. The stent was then again deployed by loosening the O-rings at the delivery micro guidewire and the delivery micro catheter with slight forward gentle traction with the right hand on the delivery micro guidewire as the retrieval device was unsheathed. Again this was left deployed for approximately 3 minutes. Another 2 mg of intra-arterial Integrilin was then infused super selectively.  At the end of this, the retrieval device was then captured into the micro catheter. Flow arrest was initiated by inflating the balloon at the proximal one-third of the right internal carotid artery.  The combination of the retrieval device, the proximal aspect which had been captured into the micro catheter was then retrieved as constant aspiration was applied with a 60 mL syringe at the hub of the 8 [place] Flowgate guide catheter. The aspiration was continued as the combination was retrieved and removed. There was a blockage with difficulty in aspirating from the guide catheter. The aspiration was continued as the balloon was then deflated and the 8  [place] guide catheter was  retrieved and removed. At this time, the aspirate revealed a 2.5 mm x 7.5 mm clot.  A JB1 catheter was then advanced into the right common carotid artery. A control arteriogram performed demonstrated complete angiographic revascularization of the right middle cerebral artery with flash filling of the right A1 segment proximally.  Also noted was mild caliber irregularity at the right internal carotid artery in its mid segment at the site of the double U configuration causes this and the slight irregularity of the wall. Approximately 2 more mg of superselective Integrilin was then infused. This was suspicious of a small focal dissection non flow limiting.  The diagnostic JB1 catheter was then advanced into the left common carotid artery. An arteriogram performed centered intracranially demonstrated free flow into the left internal carotid artery, the left middle cerebral artery and the left anterior cerebral artery with flow cross-filling via the anterior communicating artery of the left A1 and A2 segments.  No change was seen in the suspected aneurysm intracranially of the posterior wall of the left internal carotid artery. Throughout the procedure the patient's neurological status and hemodynamic status remained stable. No extravasation of contrast was noted at any time. The Arrow neurovascular sheath was then retrieved into the abdominal aorta and exchanged over a J-tip guidewire for a 9 French Pinnacle sheath. This was then connected to continuous heparinized saline infusion. The patient was then transferred to the CT scanner for postprocedural CT scan of the brain.  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  Probable approximately 7.5 mm x 4.5 mm  intracranial aneurysm arising from the left internal carotid artery posterior wall just distal to the origin of the left ophthalmic artery.   Electronically Signed   By: Luanne Bras M.D.   On: 01/30/2014 09:44   Ir Neuro Each Add'l After Basic Uni Right (ms)  01/31/2014   CLINICAL DATA:  Left-sided weakness. Right-sided gaze preference. Large area of penumbra on CT perfusion maps.  EXAM: IR ANGIO INTRA EXTRACRAN SEL INTERNAL CAROTID UNI RIGHT MOD SED AND BILATERAL COMMON CAROTID ARTERIOGRAMS FOLLOWED BY ENDOVASCULAR COMPLETE REVASCULARIZATION OF OCCLUDED RIGHT MIDDLE CEREBRAL ARTERY USING SUPER SELECTIVE INTRACRANIAL INTRA-ARTERIAL 10 MG OF INTEGRILIN AND 2 PASSES WITH THE SOLITAIRE FR 4 MM X 40 MM STENT RETRIEVAL DEVICE. RIGHT VERTEBRAL ARTERY ANGIOGRAM.  ANESTHESIA/SEDATION: General anesthesia.  MEDICATIONS: As per general anesthesia.  CONTRAST:  130mL OMNIPAQUE IOHEXOL 300 MG/ML  SOLN  PROCEDURE: Following a full explanation of the procedure along with the potential associated complications, an informed witnessed consent was obtained from the patient and her daughter.  The patient was put under general anesthesia by the Department of Anesthesiology at [facility].  The right groin was prepped and draped in the usual sterile fashion. Thereafter using modified Seldinger technique, transfemoral access into the right common femoral artery was obtained without difficulty. Over a 0.035 inch guidewire, a 5 French Pinnacle sheath was inserted. Through this, and also over a 0.035 inch guidewire a 5 French JB1 catheter was advanced to the aortic arch region and selectively positioned in the innominate artery, the right common carotid artery and left common carotid artery. There were no acute complications.  COMPLICATIONS: None immediate.  FINDINGS: The right vertebral artery origin is normal.  The vessel is seen to opacify normally to the cranial skull base to  opacify the right vertebrobasilar junction  and the right posterior inferior cerebellar artery.  The right common carotid arteriogram demonstrates the right external carotid artery and its major branches to be widely patent.  The right internal carotid artery at the bulb and its proximal one-third is normal.  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  More distally the vessel assumes normal caliber to the cranial skull base. The petrous, cavernous and supraclinoid segments are widely patent.  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  Patency of the lateral and medial lenticulostriates and of the recurrent artery of [place] is noted.  The right anterior cerebral artery is seen to opacify into the capillary and venous phases.  Cross filling via the anterior communicating artery of the left anterior cerebral artery A2 segment is also seen. The left common carotid arteriogram injection which was performed at the end of the intervention demonstrates the left external carotid artery and its major branches to be widely patent.  The left internal carotid artery at the bulb to the cranial skull base opacifies normally.  The petrous, the cavernous and the supraclinoid segments are widely patent.  Noted in the left internal carotid artery supraclinoid segment just at the origin of the ophthalmic artery is a saccular outpouching projecting posteriorly slightly inferiorly. This measures approximately 7 mm x 4.5 mm, and is suggestive of an intracranial aneurysm.  The left middle and the left anterior cerebral arteries opacify normally into the capillary and venous phases. Cross filling via the anterior communicating artery of the right anterior cerebral artery A2 segment is seen. Opacification of the A1 segment is also seen.  ENDOVASCULAR COMPLETE REVASCULARIZATION OF OCCLUDED RIGHT MIDDLE CEREBRAL  ARTERY M1 SEGMENT USING SUPERSELECTIVE INTRACRANIAL INTRA-ARTERIAL 10 MG OF INTEGRELIN AND 2 PASSES WITH THE SOLITAIRE FR 4 MM X 40 MM STENT RETRIEVAL DEVICE:  The diagnostic JB1 catheter in the right common carotid artery was exchanged over a 0.035 inch 300 cm Rosen exchange guidewire for an 80 cm 8 French Arrow sheath using biplane roadmap technique and constant fluoroscopic guidance. Good aspiration was obtained from the side port of the Arrow neurovascular sheath. A gentle contrast injection demonstrated no evidence of spasms, dissections or of intraluminal filling defects.  This was then connected to continuous heparinized saline infusion.  Over the [employer] guidewire, an 8 French 85 cm Flowgate balloon guide catheter was then advanced and positioned just proximal to the right common carotid bifurcation. The balloon had been prepped with 50% contrast and 50% heparinized saline infusion.  Good aspiration was obtained from the hub of the 8 [place] Flowgate balloon guide catheter. A gentle contrast injection demonstrated no evidence of spasms, dissections or of intraluminal filling defects.  Over a 0.035 inch Roadrunner guidewire using biplane roadmap technique and constant fluoroscopic guidance, the 8 [place] Flowgate balloon guide catheter was advanced into the proximal one-third of the right internal carotid artery. Again the guidewire was removed. Good aspiration was obtained from the hub of the 8 [place] Flowgate balloon guide catheter.  A gentle contrast injection demonstrated free flow without evidence of spasms, dissections or of intraluminal filling defects.  A control arteriogram performed centered intracranially continued to demonstrate no change in the occluded right middle cerebral artery M1 segment.  At this time in a coaxial manner and with constant heparinized saline infusion using biplane roadmap technique and  constant fluoroscopic guidance, a combination of an 18 L Merci micro catheter which  had been steamed-shaped was advanced with a DAC 125 catheter over a 0.0141 Softip Synchro micro guidewire to the distal end of the 8 [place] Flowgate guide catheter.  With the micro guidewire leading with a J-tip configuration, the combination of the micro catheter and a DAC 125 catheter was then advanced using a torque device to the supraclinoid right ICA. The right M1 segment was then entered with the micro guidewire and advanced into the distal inferior M3 region of the inferior division of the right middle cerebral artery followed by the micro catheter. The guidewire was removed. Good aspiration was obtained from the hub of the Merci micro catheter. Approximately 4 mg of superselective intracranial Integrilin was then infused over approximately 2 minutes. Thereafter, a 4 mm x 40 mm Solitaire FR stent retrieval device was then advanced in a coaxial manner and with constant heparinized saline infusion using biplane roadmap technique and constant fluoroscopic guidance to the distal end of the 18 L micro catheter.  The O-rings on the delivery micro catheter and the delivery micro guidewire were then loosened.  With slight forward gentle traction with the right hand on the delivery micro guidewire, with the left hand the delivery micro catheter was then retrieved unsheathing the entire retrieval device. The device was left deployed for approximately 3 minutes.  A control arteriogram performed through the 8 [place] Flowgate guide catheter demonstrated antegrade flow into the right MCA distribution with focal filling defects within the stent retrieval representative of clot.  At this juncture another 2 mg of superselective Integrilin was then infused over about a minute.  The balloon at the distal end of the Flowgate guide catheter and the right internal carotid artery was then inflated for proximal flow arrest. The proximal aspect of the retrieval device was then captured into the micro catheter.  With constant  aspiration being applied at the hub of the 8 [place] Flowgate guide catheter via the side port of the [place], the combination of the retrieval device, the [facility] 125 catheter and the Merci micro catheter were then retrieved and removed as the aspiration was continued. Aspiration with a 60 mL syringe was continued as the balloon was then deflated in the right internal carotid artery. The aspirate did not reveal any evidence of a clot. Back bleed at the hub of the 8 [place] Flowgate guide catheter was brisk.  A control arteriogram performed through the 8 [place] Flowgate guide catheter demonstrated continued occlusion of the right middle cerebral artery M1 segment with slightly improved flow.  This prompted a second pass with the advancement of a combination of the 18 L Merci micro catheter with a DAC 125 catheter over a 0.014 inch Softip Synchro micro guidewire to the distal end passed the clot into the M3 region of the inferior division of the right middle cerebral artery.  After having a safe position of the tip of the micro catheter, in a coaxial manner and with constant heparinized saline infusion using biplane roadmap technique and constant fluoroscopic guidance, a 4 mm x 40 mm Solitaire FR stent retrieval device was then advanced and positioned at the distal end of the 18 L Merci micro catheter. The stent was then again deployed by loosening the O-rings at the delivery micro guidewire and the delivery micro catheter with slight forward gentle traction with the right hand on the delivery micro guidewire as the retrieval device was unsheathed. Again this was  left deployed for approximately 3 minutes. Another 2 mg of intra-arterial Integrilin was then infused super selectively.  At the end of this, the retrieval device was then captured into the micro catheter. Flow arrest was initiated by inflating the balloon at the proximal one-third of the right internal carotid artery.  The combination of the retrieval device,  the proximal aspect which had been captured into the micro catheter was then retrieved as constant aspiration was applied with a 60 mL syringe at the hub of the 8 [place] Flowgate guide catheter. The aspiration was continued as the combination was retrieved and removed. There was a blockage with difficulty in aspirating from the guide catheter. The aspiration was continued as the balloon was then deflated and the 8 [place] guide catheter was retrieved and removed. At this time, the aspirate revealed a 2.5 mm x 7.5 mm clot.  A JB1 catheter was then advanced into the right common carotid artery. A control arteriogram performed demonstrated complete angiographic revascularization of the right middle cerebral artery with flash filling of the right A1 segment proximally.  Also noted was mild caliber irregularity at the right internal carotid artery in its mid segment at the site of the double U configuration causes this and the slight irregularity of the wall. Approximately 2 more mg of superselective Integrilin was then infused. This was suspicious of a small focal dissection non flow limiting.  The diagnostic JB1 catheter was then advanced into the left common carotid artery. An arteriogram performed centered intracranially demonstrated free flow into the left internal carotid artery, the left middle cerebral artery and the left anterior cerebral artery with flow cross-filling via the anterior communicating artery of the left A1 and A2 segments.  No change was seen in the suspected aneurysm intracranially of the posterior wall of the left internal carotid artery. Throughout the procedure the patient's neurological status and hemodynamic status remained stable. No extravasation of contrast was noted at any time. The Arrow neurovascular sheath was then retrieved into the abdominal aorta and exchanged over a J-tip guidewire for a 9 French Pinnacle sheath. This was then connected to continuous heparinized saline infusion.  The patient was then transferred to the CT scanner for postprocedural CT scan of the brain.  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   Electronically Signed   By: Luanne Bras M.D.   On: 01/30/2014 09:44    Labs:  CBC:  Recent Labs  01/29/14 0609 01/29/14 0616 01/30/14 0337  WBC 13.9*  --  13.0*  HGB 11.8* 14.6 9.9*  HCT 39.0 43.0 32.6*  PLT 195  --  201    COAGS:  Recent Labs  01/29/14 0609 01/30/14 0337  INR 1.18 1.30  APTT 33 38*    BMP:  Recent Labs  01/29/14 0609 01/29/14 0616 01/30/14 0337  NA 140 139 140  K 4.2 3.9 4.2  CL 100 101 105  CO2 24  --  24  GLUCOSE 128* 131* 101*  BUN 15 17 11   CALCIUM 9.2  --  7.8*  CREATININE 0.76 0.80 0.72  GFRNONAA 84*  --  86*  GFRAA >90  --  >  90    LIVER FUNCTION TESTS:  Recent Labs  01/29/14 0609  BILITOT 1.7*  AST 19  ALT 17  ALKPHOS 61  PROT 7.0  ALBUMIN 3.2*    Assessment and Plan:  CVA; clot retrieval with revascularization R MCA 12/14 Doing well   I spent a total of 15 minutes face to face in clinical consultation/evaluation, greater than 50% of which was counseling/coordinating care for CVA/R MCA clot retreival  Signed: Stefon Ramthun A 01/31/2014, 1:17 PM

## 2014-01-31 NOTE — Plan of Care (Signed)
Problem: Acute Treatment Outcomes Goal: BP within ordered parameters Outcome: Completed/Met Date Met:  01/31/14 BP has remained with parameters during this shift.

## 2014-01-31 NOTE — Progress Notes (Signed)
Patient arrived to unit with family at bedside, alert and oriented with no distress noted. Cardiac monitor was placed and notified, patient on 3L oxygen via [place] with humidifier, call bell within reach, bed alarm initiated. Vitals stable and patient comfortable in bed.

## 2014-01-31 NOTE — Progress Notes (Signed)
Rehab admissions - Evaluated for possible admission.  I met with patient, her daughter and her grand daughters at the bedside.  Dtr would like inpatient rehab admission.  Patient has [employer].  I will call and open the case and request acute inpatient rehab admission.  I will follow up again tomorrow.  Call me for questions.  #[phone]

## 2014-01-31 NOTE — Consult Note (Signed)
Physical Medicine and Rehabilitation Consult Reason for Consult: CVA Referring Physician: Dr. Leonie Man   HPI: Summer Bradley is a 68 y.o. right handed female with history of hypertension, COPD, CVA, and atrial fibrillation with Coumadin therapy. Patient independent with a walker prior to admission living alone. Presented 01/29/2014 with left-sided weakness after a fall. INR upon admission of 1.18. Cranial CT scan showed a 4 mm thick posterior falx subdural hemorrhage. Prominent density at the right M1 M2 junction as well as remote infarcts at the anterior and posterior margins of the right MCA territory. Patient did not receive TPA. CT cerebral perfusion showed occlusive thrombus right M1 M2 junction and perfusion findings suggesting moderate acute infarct in the central right MCA territory. Patient underwent revascularization of right MCA M1 occlusion per interventional radiology. Echocardiogram with ejection fraction of 15% normal systolic function. Patient did require initial intubation extubated 01/30/2014. Speech therapy bedside swallow evaluation 01/30/2014 patient currently NPO. Physical therapy evaluation completed 01/30/2014 with recommendations of physical medicine rehabilitation consult.  Undergoing speech therapy evaluation. Family at bedside  Review of Systems  Respiratory: Positive for cough.   Cardiovascular: Positive for palpitations.  Gastrointestinal: Positive for constipation.  Musculoskeletal: Positive for myalgias and joint pain.  Psychiatric/Behavioral: Positive for depression.       Anxiety  All other systems reviewed and are negative.  Past Medical History  Diagnosis Date  . Hypertension   . Stroke   . A-fib   . COPD (chronic obstructive pulmonary disease)    Past Surgical History  Procedure Laterality Date  . Abdominal surgery    . Radiology with anesthesia N/A 01/29/2014    Procedure: RADIOLOGY WITH ANESTHESIA;  Surgeon: Luanne Bras, MD;  Location: [facility];  Service: Radiology;  Laterality: N/A;   History reviewed. No pertinent family history. Social History:  reports that she has quit smoking. She does not have any smokeless tobacco history on file. She reports that she does not drink alcohol or use illicit drugs. Allergies:  Allergies  Allergen Reactions  . Lisinopril     Unknown reaction   Medications Prior to Admission  Medication Sig Dispense Refill  . albuterol (PROVENTIL HFA;VENTOLIN HFA) 108 (90 BASE) MCG/ACT inhaler Inhale 1 puff into the lungs every 6 (six) hours as needed for wheezing or shortness of breath.    Marland Kitchen aspirin EC 81 MG tablet Take 81 mg by mouth daily.    . budesonide-formoterol (SYMBICORT) 160-4.5 MCG/ACT inhaler Inhale 2 puffs into the lungs 2 (two) times daily.    Marland Kitchen CALCIUM-VITAMIN D PO Take 1 tablet by mouth daily.    . citalopram (CELEXA) 20 MG tablet Take 20 mg by mouth at bedtime.     . clonazePAM (KLONOPIN) 0.5 MG tablet Take 0.5 mg by mouth daily as needed for anxiety.     . fluticasone (FLONASE) 50 MCG/ACT nasal spray Place 1 spray into both nostrils daily as needed for allergies.     . furosemide (LASIX) 20 MG tablet Take 20 mg by mouth daily.     Marland Kitchen ipratropium (ATROVENT HFA) 17 MCG/ACT inhaler Inhale 2 puffs into the lungs 4 (four) times daily.    Marland Kitchen levothyroxine (SYNTHROID, LEVOTHROID) 150 MCG tablet Take 150 mcg by mouth daily before breakfast.     . lovastatin (MEVACOR) 10 MG tablet Take 10 mg by mouth daily.     . metoprolol (LOPRESSOR) 100 MG tablet Take 100 mg by mouth 2 (two) times daily.     . Multiple Vitamin (  MULTIVITAMIN WITH MINERALS) TABS tablet Take 1 tablet by mouth daily.    Marland Kitchen omeprazole (PRILOSEC) 20 MG capsule Take 20 mg by mouth daily.     . verapamil (CALAN-SR) 180 MG CR tablet Take 180 mg by mouth daily.     Marland Kitchen warfarin (COUMADIN) 1 MG tablet Take 2.5 mg by mouth daily.       Home: Home Living Family/patient expects to be discharged to:: Inpatient rehab Available Help at  Discharge: Family Type of Home: House  Lives With: Alone  Functional History: Prior Function Level of Independence: Needs assistance Gait / Transfers Assistance Needed: Used RW for ambulation ADL's / Homemaking Assistance Needed: Family A with homemaking tasks and pt states she was supposed to have an aide starting on Monday to help with ADLs.   Functional Status:  Mobility: Bed Mobility Overal bed mobility: Needs Assistance Bed Mobility: Supine to Sit, Sit to Supine Supine to sit: Mod assist, +2 for physical assistance, HOB elevated Sit to supine: Mod assist, +2 for physical assistance General bed mobility comments: pt able to A with mobility, but generally weak.  pt needs A with trunk and Bil LEs.          ADL:    Cognition: Cognition Overall Cognitive Status: Impaired/Different from baseline Arousal/Alertness: Awake/alert Orientation Level: Oriented to person, Oriented to place, Oriented to time, Oriented to situation Attention: Sustained Sustained Attention: Appears intact Memory: Impaired Memory Impairment: Retrieval deficit, Decreased short term memory Decreased Short Term Memory: Verbal basic, Verbal complex Awareness: Impaired Awareness Impairment: Intellectual impairment Problem Solving: Impaired Problem Solving Impairment: Verbal basic Safety/Judgment: Impaired Cognition Arousal/Alertness: Awake/alert Behavior During Therapy: WFL for tasks assessed/performed Overall Cognitive Status: Impaired/Different from baseline  Blood pressure 125/60, pulse 90, temperature 98.4 F (36.9 C), temperature source Oral, resp. rate 26, height 5\' 2"  (1.575 m), weight 87.2 kg (192 lb 3.9 oz), SpO2 89 %. Physical Exam  Vitals reviewed. Constitutional: She is oriented to person, place, and time.  HENT:  Bruising around the left eye  Eyes: EOM are normal.  Neck: Normal range of motion. Neck supple. No thyromegaly present.  Cardiovascular:  Cardiac rate control  Respiratory:  Effort normal and breath sounds normal. No respiratory distress.  GI: Soft. Bowel sounds are normal. She exhibits no distension.  Neurological: She is alert and oriented to person, place, and time.  Follows commands. She does exhibit some decreased insight into her deficits.  Skin: Skin is warm and dry.  Motor strength is 5/5 in the right deltoid, biceps, triceps, grip, hip flexor, knee extensor, ankle dorsiflexor and plantar flexor. Left side 4/5 in the deltoid, biceps, triceps, grip, 3 minus hip flexor, knee extensor ankle dorsiflexor and plantar flexor  Sensation difficult to assess secondary to ability to concentrate Left neglect noted on visual confrontation testing  No results found for this or any previous visit (from the past 24 hour(s)). Ct Head Wo Contrast  01/30/2014   CLINICAL DATA:  68 year old female status post right MCA infarct and intra-arterial intervention. Initial encounter.  EXAM: CT HEAD WITHOUT CONTRAST  TECHNIQUE: Contiguous axial images were obtained from the base of the skull through the vertex without intravenous contrast.  COMPARISON:  01/29/2014 and earlier.  FINDINGS: Intubated. Small volume fluid in the paranasal sinuses. Mild right mastoid effusion. No acute osseous abnormality identified. Left face and scalp broad-based soft tissue hematoma. Visualized orbit soft tissues are within normal limits.  Calcified atherosclerosis at the skull base. Interval regression of patchy hyperdensity in the posterior right  MCA territory. Mild parenchymal hypodensity in the same area compared to the presentation study at 0614 hr on 01/29/2014. No regional mass effect. Superimposed chronic appearing anterior and posterior MCA division infarcts.  Stable parafalcine small volume subdural hematoma. No new intracranial hemorrhage. No ventriculomegaly. No acute midline shift or intracranial mass effect. Stable small chronic infarct in the inferior right cerebellum. Stable cerebral white matter  hypodensity elsewhere. Decreased conspicuity of hyperdensity at the right MCA bifurcation. Stable intracranial vascular hyperdensity elsewhere.  IMPRESSION: 1. Resolved right MCA territory hyperdensity present at 1056 hrs yesterday, most likely was post Neurointervention parenchymal contrast staining. 2. Mild cytotoxic edema suspected in that same right MCA territory. Underlying chronic ischemic changes. 3. Stable parafalcine subdural hematoma. No new intracranial hemorrhage or acute mass effect. 4. No new intracranial abnormality.   Electronically Signed   By: Lars Pinks M.D.   On: 01/30/2014 07:34   Ct Head Wo Contrast  01/29/2014   CLINICAL DATA:  Right MCA clot retrieval.  Stroke.  EXAM: CT HEAD WITHOUT CONTRAST  TECHNIQUE: Contiguous axial images were obtained from the base of the skull through the vertex without intravenous contrast.  COMPARISON:  CT 01/29/2014  FINDINGS: Small interhemispheric subdural hemorrhage unchanged.  Acute infarct in the right MCA territory. This study was done following angiography and clot retrieval. There is vascular contrast present. There is high density in the right temporoparietal cortex which may be due to contrast staining from angiography versus hemorrhage. Continued follow-up recommended. No significant mass effect or midline shift. Chronic infarcts in the right frontal temporal lobe and right parietal lobe as noted previously.  Ventricle size is normal. No shift of the midline structures. Calvarium is intact.  IMPRESSION: Acute infarct right MCA territory as noted on CT perfusion study. There is interval development of high density in the right temporoparietal cortex which may be due to contrast material or hemorrhage. Continued follow-up recommended.  Small interhemispheric subdural hematoma is unchanged.   Electronically Signed   By: Franchot Gallo M.D.   On: 01/29/2014 11:22   Ct Head Wo Contrast  01/29/2014   CLINICAL DATA:  Code stroke.  Left-sided flaccid.   EXAM: CT HEAD WITHOUT CONTRAST  TECHNIQUE: Contiguous axial images were obtained from the base of the skull through the vertex without intravenous contrast.  COMPARISON:  None.  FINDINGS: Skull and Sinuses:There is a contusion of the left frontal scalp without underlying fracture.  Sinuses and mastoids are clear.  Orbits: No acute abnormality.  Brain: High-density thickening of the posterior falx up to 4 mm, most consistent with subdural hemorrhage. No related mass effect.  There is discrete cortical and subcortical low-attenuation in the anterior and posterior aspect of the right MCA territory, appearance favoring gliosis rather than cytotoxic edema. No definitive acute infarct. Subtle high attenuation of the right M2 segment relative to the left.  Critical Value/emergent results were called by telephone at the time of interpretation on 01/29/2014 at 6:34 am to Dr. Leonel Ramsay, who verbally acknowledged these results.  IMPRESSION: 1. 4 mm thick posterior falx subdural hemorrhage. 2. Prominent density at the right M1/M2 junction. CTA is already planned. 3. Established infarcts in the anterior and posterior right MCA territory. No definitive acute infarct. 4. Left frontal scalp contusion.  No acute fracture.   Electronically Signed   By: Jorje Guild M.D.   On: 01/29/2014 06:38   Ct Cerebral Perfusion W/cm  01/29/2014   CLINICAL DATA:  Code stroke. Left-sided weakness. Last normal 8 p.m.  EXAM: CT  CEREBRAL PERFUSION WITH CONTRAST  TECHNIQUE: Serial imaging through the cerebral hemispheres was performed after bolus administration of 40 cc Omnipaque 350 intravenous. Coverage extended from the level of the temporal poles nearly to the vertex.  CONTRAST:  96mL OMNIPAQUE IOHEXOL 350 MG/ML SOLN  COMPARISON:  Head CT from earlier the same day.  FINDINGS: Source images notable for a thin hematoma along the posterior falx, noted on initial noncontrast examination.  There is cut off involving the right M1-M2 junction,  consistent with thrombus, with oligemia throughout the right MCA territory. There is gradual enhancement of vessels over the right cerebral convexity via pial/pial collaterals, a favorable finding.  Elevated mean transit time densely in the right MCA territory, with partial sparing of the temporal lobe. Although there is clear decrease in cerebral blood volume at the core of the right MCA territory, there is peripheral blood volume sparing compatible with penumbra. Penumbra is preset in the region of the motor strip and of the posterior temporal lobe. Dense perfusion defects at the extreme anterior and posterior margins of the right MCA territory, consistent with remote infarcts and gliosis.  Critical Value/emergent results were called by telephone at the time of interpretation on 01/29/2014 at 655 am to Dr. Roland Rack , who verbally acknowledged these results.  IMPRESSION: 1. Occlusive thrombus at the right M1-M2 junction. Subjectively good pial/pial collateral circulation. 2. Perfusion findings suggest moderate acute infarct in the central right MCA territory with rim of penumbra, as above. 3. Remote infarcts at the anterior and posterior margins of the right MCA territory. 4. Thin posterior falx subdural hemorrhage.   Electronically Signed   By: Jorje Guild M.D.   On: 01/29/2014 07:31   Dg Chest Port 1 View  01/30/2014   CLINICAL DATA:  Respiratory failure; history of COPD and atrial fibrillation and previous CVA ; past year history of tobacco use  EXAM: PORTABLE CHEST - 1 VIEW  COMPARISON:  Portable chest x-ray of January 29, 2014  FINDINGS: The endotracheal tube tip lies 5.2 cm above the crotch of the carina. The lungs are well-expanded. There is bibasilar subsegmental atelectasis which is more conspicuous today. The underline basilar interstitial markings remain increased. The hemidiaphragms are partially obscured today. There is no pneumothorax nor large pleural effusion. The cardiac  silhouette is mildly enlarged though stable. The pulmonary vascularity is not engorged. The bony thorax exhibits no acute abnormalities.  IMPRESSION: COPD with bibasilar scarring with superimposed subsegmental atelectasis. There is no evidence of CHF.   Electronically Signed   By: David  Martinique   On: 01/30/2014 08:10   Dg Chest Port 1 View  01/29/2014   CLINICAL DATA:  Respiratory failure.  EXAM: PORTABLE CHEST - 1 VIEW  COMPARISON:  01/10/2014  FINDINGS: Endotracheal tube is present with tip 5.2 cm above the carina. Patient is slightly rotated to the left. There is mild prominence of the perihilar markings worse over the infrahilar regions likely mild interstitial edema and less likely infection. Mild stable cardiomegaly. Remainder of the exam is unchanged.  IMPRESSION: Bilateral perihilar/infrahilar opacification likely interstitial edema and less likely infection.  Endotracheal tube with tip 5.2 cm above the carina.   Electronically Signed   By: Marin Olp M.D.   On: 01/29/2014 13:24    Assessment/Plan: Diagnosis: Left MCA infarct status post embolectomy, left hemiparesis, left neglect, dysphasia 1. Does the need for close, 24 hr/day medical supervision in concert with the patient's rehab needs make it unreasonable for this patient to be served in  a less intensive setting? Yes 2. Co-Morbidities requiring supervision/potential complications: Small subdural, obesity, bronchitis 3. Due to bladder management, bowel management, safety, skin/wound care, disease management, medication administration, pain management and patient education, does the patient require 24 hr/day rehab nursing? Yes 4. Does the patient require coordinated care of a physician, rehab nurse, PT (1-2 hrs/day, 5 days/week), OT (1-2 hrs/day, 5 days/week) and SLP (0.5-1 hrs/day, 5 days/week) to address physical and functional deficits in the context of the above medical diagnosis(es)? Potentially Addressing deficits in the following  areas: balance, endurance, locomotion, strength, transferring, bowel/bladder control, bathing, dressing, feeding, grooming, toileting, cognition, speech and swallowing 5. Can the patient actively participate in an intensive therapy program of at least 3 hrs of therapy per day at least 5 days per week? Yes 6. The potential for patient to make measurable gains while on inpatient rehab is excellent 7. Anticipated functional outcomes upon discharge from inpatient rehab are supervision  with PT, supervision with OT, supervision with SLP. 8. Estimated rehab length of stay to reach the above functional goals is: 14-20 days 9. Does the patient have adequate social supports and living environment to accommodate these discharge functional goals? Potentially 10. Anticipated D/C setting: Home 11. Anticipated post D/C treatments: [place] therapy 12. Overall Rehab/Functional Prognosis: excellent  RECOMMENDATIONS: This patient's condition is appropriate for continued rehabilitative care in the following setting: CIR Patient has agreed to participate in recommended program. Potentially Note that insurance prior authorization may be required for reimbursement for recommended care.  Comment: Poor awareness of deficits, will need neuro recommendations on resuming anticoagulation    01/31/2014

## 2014-01-31 NOTE — Progress Notes (Signed)
OT Cancellation Note  Patient Details Name: Summer Bradley MRN: [id number] DOB: 08-18-1945   Cancelled Treatment:    Reason Eval/Treat Not Completed: Fatigue/lethargy limiting ability to participate;Patient at procedure or test/ unavailable.  Attempted to see pt x 2; however, pt just back to bed this am, and fatigued.  This pm, was in process of transferring to 4N.  Will reattempt.   Darlina Rumpf [place], OTR/L [phone]  01/31/2014, 4:12 PM

## 2014-01-31 NOTE — Progress Notes (Signed)
STROKE TEAM PROGRESS NOTE   HISTORY Summer Bradley is a 68 y.o. female who got up around 4:30 AM 01/29/2014 to go to the bathroom and then sat down in her chair. She is not certain if her left arm was working at that time or not. She then fell out of her chair around 5 AM and that time noticed that her left arm wasn't working. She did not realize that anything was wrong until she fell out of her chair.  She was in her normal state of health ongoing to bed the evening prior. She was last known well at 8p on 01/28/2014.  In the ER, a CT was obtained which shows evidence of a previous stroke, and a CT perfusion scan was obtained which shows a significant mismatch demonstrating an area of penumbra, though there is an smaller area of likely infarct core as well.  Patient was not administered TPA secondary to being  outside of the tPA window with question of subdural hematoma on CT. Due to mismatch, she was taken to neuro intervention where she had complete revascularization of RT MCA M1 occlusion using 2 passes with the Solitaire and 10 mg of IA Integrelin. TICI 3 flow reesablished. Afterwards, she was admitted to the neuro ICU for further evaluation and treatment.   SUBJECTIVE (INTERVAL HISTORY) The patient is alert and cooperative today, feels well. The patient wants to eat. She currently is nothing by mouth.   OBJECTIVE Temp:  [98.1 F (36.7 C)-99.4 F (37.4 C)] 98.1 F (36.7 C) (12/16 0800) Pulse Rate:  [32-146] 84 (12/16 0800) Cardiac Rhythm:  [-] Normal sinus rhythm (12/16 0800) Resp:  [17-44] 38 (12/16 0800) BP: (99-138)/(46-96) 138/67 mmHg (12/16 0800) SpO2:  [88 %-100 %] 96 % (12/16 0800)  No results for input(s): GLUCAP in the last 168 hours.  Recent Labs Lab 01/29/14 0609 01/29/14 0616 01/30/14 0337  NA 140 139 140  K 4.2 3.9 4.2  CL 100 101 105  CO2 24  --  24  GLUCOSE 128* 131* 101*  BUN 15 17 11   CREATININE 0.76 0.80 0.72  CALCIUM 9.2  --  7.8*  MG  --   --  1.6  PHOS   --   --  2.2*    Recent Labs Lab 01/29/14 0609  AST 19  ALT 17  ALKPHOS 61  BILITOT 1.7*  PROT 7.0  ALBUMIN 3.2*    Recent Labs Lab 01/29/14 0609 01/29/14 0616 01/30/14 0337  WBC 13.9*  --  13.0*  NEUTROABS 10.7*  --  10.5*  HGB 11.8* 14.6 9.9*  HCT 39.0 43.0 32.6*  MCV 82.6  --  84.5  PLT 195  --  201   No results for input(s): CKTOTAL, CKMB, CKMBINDEX, TROPONINI in the last 168 hours.  Recent Labs  01/29/14 0609 01/30/14 0337  LABPROT 15.1 16.3*  INR 1.18 1.30    Recent Labs  01/29/14 0659  COLORURINE AMBER*  LABSPEC 1.042*  PHURINE 5.5  GLUCOSEU NEGATIVE  HGBUR NEGATIVE  BILIRUBINUR NEGATIVE  KETONESUR NEGATIVE  PROTEINUR 30*  UROBILINOGEN 1.0  NITRITE NEGATIVE  LEUKOCYTESUR NEGATIVE       Component Value Date/Time   CHOL 114 01/30/2014 0337   TRIG 102 01/30/2014 0337   HDL 24* 01/30/2014 0337   CHOLHDL 4.8 01/30/2014 0337   VLDL 20 01/30/2014 0337   LDLCALC 70 01/30/2014 0337   Lab Results  Component Value Date   HGBA1C 6.4* 01/30/2014      Component Value Date/Time  LABOPIA NONE DETECTED 01/29/2014 0659   COCAINSCRNUR NONE DETECTED 01/29/2014 0659   LABBENZ NONE DETECTED 01/29/2014 0659   AMPHETMU NONE DETECTED 01/29/2014 0659   THCU NONE DETECTED 01/29/2014 0659   LABBARB NONE DETECTED 01/29/2014 0659     Recent Labs Lab 01/29/14 0609  ETH <11    Ct Head Wo Contrast  01/30/2014    1. Resolved right MCA territory hyperdensity present at 1056 hrs yesterday, most likely was post Neurointervention parenchymal contrast staining. 2. Mild cytotoxic edema suspected in that same right MCA territory. Underlying chronic ischemic changes. 3. Stable parafalcine subdural hematoma. No new intracranial hemorrhage or acute mass effect. 4. No new intracranial abnormality.    01/29/2014    Acute infarct right MCA territory as noted on CT perfusion study. There is interval development of high density in the right temporoparietal cortex which may be  due to contrast material or hemorrhage. Continued follow-up recommended.  Small interhemispheric subdural hematoma is unchanged.    01/29/2014   1. 4 mm thick posterior falx subdural hemorrhage. 2. Prominent density at the right M1/M2 junction. CTA is already planned. 3. Established infarcts in the anterior and posterior right MCA territory. No definitive acute infarct. 4. Left frontal scalp contusion.  No acute fracture.     Ct Cerebral Perfusion W/cm 01/29/2014    1. Occlusive thrombus at the right M1-M2 junction. Subjectively good pial/pial collateral circulation. 2. Perfusion findings suggest moderate acute infarct in the central right MCA territory with rim of penumbra, as above. 3. Remote infarcts at the anterior and posterior margins of the right MCA territory. 4. Thin posterior falx subdural hemorrhage.     Cerebral Angiogram 01/29/2014 S/P Bilateral common carotid arteriograms ,followed by complete revascularization of RT MCA M1 occlusion usingX2 passes with the Solitaire FR 1mm x 4mm retrieval device And 10 mg of  superselective intracranial IA Integrelin. TICI 3 flow reesablished.   Dg Chest Port 1 View 01/30/2014    COPD with bibasilar scarring with superimposed subsegmental atelectasis. There is no evidence of CHF.    01/29/2014    Bilateral perihilar/infrahilar opacification likely interstitial edema and less likely infection.  Endotracheal tube with tip 5.2 cm above the carina.      2D Echocardiogram  EF 50-55% with no source of embolus.    Physical Exam  General: The patient is alert and cooperative at the time of the examination. The patient is moderately obese. The patient has ecchymosis over the left brow.  Respiratory: Lung fields are clear  Cardiovascular: Occasional irregular heart rhythm, no murmurs noted  Abdomen: Soft, nontender  Skin: No significant peripheral edema is noted.   Neurologic Exam  Mental status: The patient is oriented x 3.  Cranial nerves:  Facial symmetry is not present. Patient has some depression of the left nasolabial fold. Speech is normal, no aphasia or dysarthria is noted. Extraocular movements are full. Visual fields are full.  Motor: The patient has good strength in all 4 extremities, with exception there is minimal weakness of the grip of the left hand and with elevation of the left arm. Legs are good strength bilaterally..  Sensory examination: Soft touch sensation is symmetric on the face, arms, and legs.  Coordination: The patient has good finger-nose-finger and heel-to-shin bilaterally, with exception of some mild dysmetria with the left upper extremity..  Gait and station: Gait was not tested.  Reflexes: Deep tendon reflexes are symmetric.    ASSESSMENT/PLAN Ms. Summer Bradley is a 68 y.o. female with history of  hypertension, stroke, afib and COPD presenting with Left-sided weakness. She did not receive IV t-PA due to delay in arrival, ? SDH.   Stroke:  Suspect right MCA infarct status post complete vascularization of right MCA M1 occlusion with IA Integrilin and mechanical thrombectomy, workup underway.  MRI pending - ordered on admission  2D Echo  No source of embolus  HgbA1c 6.4  SCDs for VTE prophylaxis  Diet NPO time specified   aspirin 81 mg orally every day and warfarin prior to admission, added aspirin PR or PO. Consider anticoagulation once MRI completed  Ongoing aggressive stroke risk factor management  Therapy recommendations:  CIR  Disposition:  CIR consult pending   Acute Respiratory Failure  Intubated for neuro intervention  Critical care managed ventilator  Extubated 01/30/2014  Atrial Fibrillation  Home meds:  Warfarin  INR subtherapeutic on admission, INR 1.18    placed on amiodarone for rate control in setting of hypertension  Plan anticoagulation at discharge, pending MRI results   Hypertension  Systolic blood pressure less than 100 post procedure. Given IV fluid  bolus. Hypotension resolved  Continue to monitor  Hyperlipidemia  Home meds:  Mevacor  LDL 70, goal < 70  Reason Mevacor once able to swallow  Other Stroke Risk Factors  Advanced age  Obesity, Body mass index is 35.15 kg/(m^2).   Hx stroke/TIA  Other Active Problems  COPD  Depression, on chronic SSRI  Hospital day # 2   The patient has made a good recovery following the clot extraction. The patient has minimal weakness of the left arm, the family indicates that this was plegic at the time of the initial stroke. The patient should be a good rehabilitation candidate. Speech therapy is following, the patient is nothing by mouth currently. We will plan on transferring the patient to the floor today. We'll need to mobilize patient, the patient will need to go back on anticoagulation, convert to a newer anticoagulant. MRI the brain is pending, if no significant hemorrhage, the patient could be started on anticoagulant therapy in the next day or 2. The patient is on aspirin at this time.  Lenor Coffin [phone]   To contact Stroke Continuity provider, please refer to [email or web address] After hours, contact General Neurology

## 2014-01-31 NOTE — Progress Notes (Signed)
Physical Therapy Treatment Patient Details Name: Summer Bradley MRN: [id number] DOB: 15-Sep-1945 Today's Date: 01/31/2014    History of Present Illness 68 yo female admitted from [facility] with L side weakness, vision loss and s/p fall hitting L side of face. intubated 01/29/14; 12/14 R MCA clot retrieval with revascularixation. pt extubated 01/30/14 PMH: HTN, CVA, AFIB, COPD, abdominal surg    PT Comments    Pt more participative in mobility today and more conversant.  Pt's HR remained in 90's throughout session, though pt continues to have DOE with O2 sats remaining in low 90's.  Still feel pt would benefit from CIR at D/C to increase independence prior to returning to home.  Will continue to follow.    Follow Up Recommendations  CIR     Equipment Recommendations   (TBD)    Recommendations for Other Services Rehab consult     Precautions / Restrictions Precautions Precautions: Fall Precaution Comments: Elevated HR Restrictions Weight Bearing Restrictions: No    Mobility  Bed Mobility Overal bed mobility: Needs Assistance Bed Mobility: Supine to Sit     Supine to sit: Min assist;HOB elevated     General bed mobility comments: With [facility] elevated pt is able to use bed rails to A with coming to EOB.  pt needs MinA with bringing trunk up to sitting.    Transfers Overall transfer level: Needs assistance Equipment used: 2 person hand held assist Transfers: Sit to/from [facility] Sit to Stand: Mod assist;+2 physical assistance Stand pivot transfers: Mod assist;+2 physical assistance       General transfer comment: cues for UE use and controlling descent to sitting.  pt moves slowly and needs increased time to complete mobility.  Repeated coming to stand x2.    Ambulation/Gait Ambulation/Gait assistance: Mod assist;+2 physical assistance Ambulation Distance (Feet): 3 Feet Assistive device: 2 person hand held assist Gait Pattern/deviations: Step-through  pattern;Decreased stride length     General Gait Details: pt able to take small steps forward and backward with 2 person A.  pt fatigues easily and becomes SOB, but O2 sats remained in low 90's throughout mobility.     Stairs            Wheelchair Mobility    Modified Rankin (Stroke Patients Only) Modified Rankin (Stroke Patients Only) Pre-Morbid Rankin Score: Moderate disability Modified Rankin: Severe disability     Balance Overall balance assessment: Needs assistance Sitting-balance support: Bilateral upper extremity supported;Feet supported Sitting balance-Leahy Scale: Poor     Standing balance support: During functional activity Standing balance-Leahy Scale: Poor                      Cognition Arousal/Alertness: Awake/alert Behavior During Therapy: WFL for tasks assessed/performed Overall Cognitive Status: Within Functional Limits for tasks assessed                      Exercises      General Comments        Pertinent Vitals/Pain Pain Assessment: No/denies pain    Home Living                      Prior Function            PT Goals (current goals can now be found in the care plan section) Acute Rehab PT Goals Patient Stated Goal: Back to normal.   PT Goal Formulation: With patient Time For Goal Achievement: 02/13/14 Potential to Achieve Goals: Good  Progress towards PT goals: Progressing toward goals    Frequency  Min 4X/week    PT Plan Current plan remains appropriate    Co-evaluation             End of Session Equipment Utilized During Treatment: Gait belt;Oxygen Activity Tolerance: Patient tolerated treatment well Patient left: in chair;with call bell/phone within reach;with family/visitor present     Time: 3568-6168 PT Time Calculation (min) (ACUTE ONLY): 26 min  Charges:  $Therapeutic Activity: 23-37 mins                    G CodesCatarina Hartshorn, [place] 01/31/2014, 11:21 AM

## 2014-02-01 ENCOUNTER — Inpatient Hospital Stay (HOSPITAL_COMMUNITY): Payer: Medicare HMO

## 2014-02-01 ENCOUNTER — Inpatient Hospital Stay (HOSPITAL_COMMUNITY): Payer: Medicare HMO | Admitting: Anesthesiology

## 2014-02-01 ENCOUNTER — Encounter (HOSPITAL_COMMUNITY)
Admission: EM | Disposition: A | Discharge status: Skilled Nursing Facility | Payer: Self-pay | Source: Home / Self Care | Attending: Neurology

## 2014-02-01 DIAGNOSIS — J9600 Acute respiratory failure, unspecified whether with hypoxia or hypercapnia: Secondary | ICD-10-CM | POA: Diagnosis present

## 2014-02-01 DIAGNOSIS — I6320 Cerebral infarction due to unspecified occlusion or stenosis of unspecified precerebral arteries: Secondary | ICD-10-CM

## 2014-02-01 DIAGNOSIS — I480 Paroxysmal atrial fibrillation: Secondary | ICD-10-CM

## 2014-02-01 HISTORY — PX: RADIOLOGY WITH ANESTHESIA: SHX6223

## 2014-02-01 LAB — CBC WITH DIFFERENTIAL/PLATELET
Basophils Absolute: 0 10*3/uL (ref 0.0–0.1)
Basophils Relative: 0 % (ref 0–1)
Eosinophils Absolute: 0 10*3/uL (ref 0.0–0.7)
Eosinophils Relative: 0 % (ref 0–5)
HCT: 27.4 % — ABNORMAL LOW (ref 36.0–46.0)
Hemoglobin: 8.1 g/dL — ABNORMAL LOW (ref 12.0–15.0)
Lymphocytes Relative: 11 % — ABNORMAL LOW (ref 12–46)
Lymphs Abs: 1.3 10*3/uL (ref 0.7–4.0)
MCH: 24.6 pg — ABNORMAL LOW (ref 26.0–34.0)
MCHC: 29.6 g/dL — ABNORMAL LOW (ref 30.0–36.0)
MCV: 83.3 fL (ref 78.0–100.0)
Monocytes Absolute: 0.9 10*3/uL (ref 0.1–1.0)
Monocytes Relative: 7 % (ref 3–12)
Neutro Abs: 9.7 10*3/uL — ABNORMAL HIGH (ref 1.7–7.7)
Neutrophils Relative %: 82 % — ABNORMAL HIGH (ref 43–77)
Platelets: 234 10*3/uL (ref 150–400)
RBC: 3.29 MIL/uL — ABNORMAL LOW (ref 3.87–5.11)
RDW: 19.3 % — ABNORMAL HIGH (ref 11.5–15.5)
WBC: 11.9 10*3/uL — ABNORMAL HIGH (ref 4.0–10.5)

## 2014-02-01 LAB — POCT ACTIVATED CLOTTING TIME: Activated Clotting Time: 163 seconds

## 2014-02-01 SURGERY — RADIOLOGY WITH ANESTHESIA
Anesthesia: General

## 2014-02-01 MED ORDER — CHLORHEXIDINE GLUCONATE 0.12 % MT SOLN
15.0000 mL | Freq: Two times a day (BID) | OROMUCOSAL | Status: DC
  Administered 2014-02-01 – 2014-02-08 (×14): 15 mL via OROMUCOSAL
  Filled 2014-02-01 (×16): qty 15

## 2014-02-01 MED ORDER — FENTANYL CITRATE 0.05 MG/ML IJ SOLN
INTRAMUSCULAR | Status: DC | PRN
  Administered 2014-02-01: 100 ug via INTRAVENOUS

## 2014-02-01 MED ORDER — CETYLPYRIDINIUM CHLORIDE 0.05 % MT LIQD
7.0000 mL | Freq: Four times a day (QID) | OROMUCOSAL | Status: DC
  Administered 2014-02-01 – 2014-02-08 (×27): 7 mL via OROMUCOSAL

## 2014-02-01 MED ORDER — ROCURONIUM BROMIDE 100 MG/10ML IV SOLN
INTRAVENOUS | Status: DC | PRN
  Administered 2014-02-01: 50 mg via INTRAVENOUS

## 2014-02-01 MED ORDER — CEFAZOLIN SODIUM-DEXTROSE 2-3 GM-% IV SOLR
INTRAVENOUS | Status: AC
  Filled 2014-02-01: qty 50

## 2014-02-01 MED ORDER — METOPROLOL TARTRATE 50 MG PO TABS
50.0000 mg | ORAL_TABLET | Freq: Two times a day (BID) | ORAL | Status: DC
  Administered 2014-02-01 – 2014-02-02 (×2): 50 mg
  Filled 2014-02-01 (×3): qty 1

## 2014-02-01 MED ORDER — NICARDIPINE HCL IN NACL 20-0.86 MG/200ML-% IV SOLN: 5.0000 mg/h | INTRAVENOUS | Status: DC

## 2014-02-01 MED ORDER — ASPIRIN 325 MG PO TABS
325.0000 mg | ORAL_TABLET | Freq: Every day | ORAL | Status: DC
Start: 2014-02-01 — End: 2014-02-02
  Administered 2014-02-01: 325 mg via ORAL
  Filled 2014-02-01 (×2): qty 1

## 2014-02-01 MED ORDER — SODIUM CHLORIDE 0.9 % IV SOLN
INTRAVENOUS | Status: DC | PRN
  Administered 2014-02-01: 09:00:00 via INTRAVENOUS

## 2014-02-01 MED ORDER — EPTIFIBATIDE 2 MG/ML IV SOLN
INTRAVENOUS | Status: AC
  Administered 2014-02-01 (×4): 2 mg
  Filled 2014-02-01: qty 10

## 2014-02-01 MED ORDER — NITROGLYCERIN 1 MG/10 ML FOR IR/CATH LAB
INTRA_ARTERIAL | Status: AC
  Administered 2014-02-01: 25 ug
  Filled 2014-02-01: qty 10

## 2014-02-01 MED ORDER — FENTANYL CITRATE 0.05 MG/ML IJ SOLN
100.0000 ug | Freq: Once | INTRAMUSCULAR | Status: AC
  Administered 2014-02-01: 100 ug via INTRAVENOUS

## 2014-02-01 MED ORDER — SODIUM CHLORIDE 0.9 % IV BOLUS (SEPSIS)
1000.0000 mL | Freq: Once | INTRAVENOUS | Status: AC
Start: 2014-02-01 — End: 2014-02-02
  Administered 2014-02-01: 1000 mL via INTRAVENOUS

## 2014-02-01 MED ORDER — LIDOCAINE HCL 1 % IJ SOLN
INTRAMUSCULAR | Status: AC
  Filled 2014-02-01: qty 20

## 2014-02-01 MED ORDER — FENTANYL CITRATE 0.05 MG/ML IJ SOLN
INTRAMUSCULAR | Status: AC
  Administered 2014-02-01: 100 ug via INTRAVENOUS
  Filled 2014-02-01: qty 4

## 2014-02-01 MED ORDER — PROPOFOL INFUSION 10 MG/ML OPTIME
INTRAVENOUS | Status: DC | PRN
  Administered 2014-02-01: 50 ug/kg/min via INTRAVENOUS

## 2014-02-01 MED ORDER — ONDANSETRON HCL 4 MG/2ML IJ SOLN: 4.0000 mg | Freq: Four times a day (QID) | INTRAMUSCULAR | Status: DC | PRN

## 2014-02-01 MED ORDER — SODIUM CHLORIDE 0.9 % IV BOLUS (SEPSIS): 500.0000 mL | INTRAVENOUS | Status: DC | PRN

## 2014-02-01 MED ORDER — SODIUM CHLORIDE 0.9 % IV SOLN: INTRAVENOUS | Status: DC

## 2014-02-01 MED ORDER — PHENYLEPHRINE HCL 10 MG/ML IJ SOLN
10.0000 mg | INTRAVENOUS | Status: DC | PRN
  Administered 2014-02-01: 50 ug/min via INTRAVENOUS

## 2014-02-01 MED ORDER — ACETAMINOPHEN 650 MG RE SUPP: 650.0000 mg | Freq: Four times a day (QID) | RECTAL | Status: DC | PRN

## 2014-02-01 MED ORDER — MIDAZOLAM HCL 2 MG/2ML IJ SOLN
INTRAMUSCULAR | Status: AC
  Administered 2014-02-01: 2 mg via INTRAVENOUS
  Filled 2014-02-01: qty 4

## 2014-02-01 MED ORDER — IOHEXOL 300 MG/ML  SOLN
150.0000 mL | Freq: Once | INTRAMUSCULAR | Status: AC | PRN
  Administered 2014-02-01: 125 mL via INTRA_ARTERIAL

## 2014-02-01 MED ORDER — PANTOPRAZOLE SODIUM 40 MG PO PACK
40.0000 mg | PACK | Freq: Every day | ORAL | Status: DC
  Administered 2014-02-01 – 2014-02-05 (×5): 40 mg
  Filled 2014-02-01 (×7): qty 20

## 2014-02-01 MED ORDER — PRAVASTATIN SODIUM 10 MG PO TABS
10.0000 mg | ORAL_TABLET | Freq: Every day | ORAL | Status: DC
  Administered 2014-02-01 – 2014-02-05 (×5): 10 mg
  Filled 2014-02-01 (×6): qty 1

## 2014-02-01 MED ORDER — AMIODARONE HCL 200 MG PO TABS
400.0000 mg | ORAL_TABLET | Freq: Two times a day (BID) | ORAL | Status: DC
  Administered 2014-02-01 – 2014-02-02 (×3): 400 mg
  Filled 2014-02-01 (×5): qty 2

## 2014-02-01 MED ORDER — HEPARIN SODIUM (PORCINE) 1000 UNIT/ML IJ SOLN
INTRAMUSCULAR | Status: DC | PRN
  Administered 2014-02-01: 3000 [IU] via INTRAVENOUS

## 2014-02-01 MED ORDER — DEXTROSE 5 % IV SOLN
0.0000 ug/min | INTRAVENOUS | Status: DC
  Filled 2014-02-01: qty 1

## 2014-02-01 MED ORDER — ETOMIDATE 2 MG/ML IV SOLN
20.0000 mg | Freq: Once | INTRAVENOUS | Status: AC
  Administered 2014-02-01: 20 mg via INTRAVENOUS

## 2014-02-01 MED ORDER — CITALOPRAM HYDROBROMIDE 20 MG PO TABS
20.0000 mg | ORAL_TABLET | Freq: Every day | ORAL | Status: DC
  Administered 2014-02-01 – 2014-03-04 (×31): 20 mg
  Filled 2014-02-01 (×7): qty 1
  Filled 2014-02-01: qty 2
  Filled 2014-02-01 (×26): qty 1

## 2014-02-01 MED ORDER — PROPOFOL 10 MG/ML IV EMUL
0.0000 ug/kg/min | INTRAVENOUS | Status: DC
  Administered 2014-02-01: 10 ug/kg/min via INTRAVENOUS
  Administered 2014-02-02: 5 ug/kg/min via INTRAVENOUS
  Administered 2014-02-02: 10 ug/kg/min via INTRAVENOUS
  Filled 2014-02-01 (×3): qty 100

## 2014-02-01 MED ORDER — ACETAMINOPHEN 500 MG PO TABS
1000.0000 mg | ORAL_TABLET | Freq: Four times a day (QID) | ORAL | Status: DC | PRN
  Administered 2014-02-01: 1000 mg via ORAL
  Filled 2014-02-01: qty 2

## 2014-02-01 MED ORDER — LORAZEPAM 2 MG/ML IJ SOLN
1.0000 mg | Freq: Once | INTRAMUSCULAR | Status: AC
  Administered 2014-02-01: 1 mg via INTRAVENOUS
  Filled 2014-02-01: qty 1

## 2014-02-01 MED ORDER — CEFAZOLIN SODIUM-DEXTROSE 2-3 GM-% IV SOLR
2.0000 g | Freq: Once | INTRAVENOUS | Status: AC
  Administered 2014-02-01: 2 g via INTRAVENOUS

## 2014-02-01 MED ORDER — LEVOTHYROXINE SODIUM 150 MCG PO TABS
150.0000 ug | ORAL_TABLET | Freq: Every day | ORAL | Status: DC
  Administered 2014-02-02 – 2014-03-05 (×30): 150 ug
  Filled 2014-02-01 (×37): qty 1

## 2014-02-01 MED ORDER — MIDAZOLAM HCL 2 MG/2ML IJ SOLN
2.0000 mg | Freq: Once | INTRAMUSCULAR | Status: AC
  Administered 2014-02-01: 2 mg via INTRAVENOUS

## 2014-02-01 MED ORDER — FENTANYL CITRATE 0.05 MG/ML IJ SOLN
25.0000 ug | INTRAMUSCULAR | Status: DC | PRN
  Administered 2014-02-01: 50 ug via INTRAVENOUS
  Administered 2014-02-02: 100 ug via INTRAVENOUS
  Filled 2014-02-01 (×2): qty 2

## 2014-02-01 NOTE — Transfer of Care (Signed)
Immediate Anesthesia Transfer of Care Note  Patient: Summer Bradley  Procedure(s) Performed: Procedure(s): RADIOLOGY WITH ANESTHESIA (N/A)  Patient Location: ICU  Anesthesia Type:General  Level of Consciousness: sedated  Airway & Oxygen Therapy: Patient remains intubated per anesthesia plan and Patient placed on Ventilator (see vital sign flow sheet for setting)  Post-op Assessment: Post -op Vital signs reviewed and stable and report given to [facility] ICU RN  Post vital signs: Reviewed and stable  Complications: No apparent anesthesia complications

## 2014-02-01 NOTE — Progress Notes (Addendum)
Subjective: Patient became unresponsive at approximately 0645.  Had been agitated.  Previously able to communicate.  Heart rate initially decreased to the 30's then increased and patient now in afib with RVR.  On amiodarone and metoprolol.  Objective: Current vital signs: BP 177/96 mmHg  Pulse 126  Temp(Src) 98.5 F (36.9 C) (Oral)  Resp 18  Ht 5\' 2"  (1.575 m)  Wt 87.2 kg (192 lb 3.9 oz)  BMI 35.15 kg/m2  SpO2 95% Vital signs in last 24 hours: Temp:  [98.2 F (36.8 C)-99.3 F (37.4 C)] 98.5 F (36.9 C) (12/17 0453) Pulse Rate:  [62-126] 126 (12/17 0723) Resp:  [17-38] 18 (12/17 0453) BP: (123-177)/(53-99) 177/96 mmHg (12/17 0723) SpO2:  [84 %-97 %] 95 % (12/17 0453)  Intake/Output from previous day: 12/16 0701 - 12/17 0700 In: 66.8 [I.V.:66.8] Out: 35 [Urine:35] Intake/Output this shift:   Nutritional status: DIET DYS 2  Neurologic Exam: Mental Status: Patient does not respond to verbal stimuli.  Decerebrate posturing with deep sternal rub.  Does not follow commands.  No verbalizations noted.  Cranial Nerves: II: patient does not respond confrontation bilaterally, pupils right 3 mm, left 3 mm,and reactive bilaterally III,IV,VI: doll's response absent bilaterally.  V,VII: corneal reflex reduced bilaterally  VIII: patient does not respond to verbal stimuli IX,X: gag reflex reduced, XI: trapezius strength unable to test bilaterally XII: tongue strength unable to test Motor: Extremities flaccid throughout.  No spontaneous movement noted.  No purposeful movements noted. Sensory: Does not respond to noxious stimuli in any extremity.  General Examination: Lungs: CTA CV: Single S1, S2 Abd: +bowel sounds Extremities: pulses appreciated in all extremities    Lab Results: Basic Metabolic Panel:  Recent Labs Lab 01/29/14 0609 01/29/14 0616 01/30/14 0337  NA 140 139 140  K 4.2 3.9 4.2  CL 100 101 105  CO2 24  --  24  GLUCOSE 128* 131* 101*  BUN 15 17 11    CREATININE 0.76 0.80 0.72  CALCIUM 9.2  --  7.8*  MG  --   --  1.6  PHOS  --   --  2.2*    Liver Function Tests:  Recent Labs Lab 01/29/14 0609  AST 19  ALT 17  ALKPHOS 61  BILITOT 1.7*  PROT 7.0  ALBUMIN 3.2*   No results for input(s): LIPASE, AMYLASE in the last 168 hours. No results for input(s): AMMONIA in the last 168 hours.  CBC:  Recent Labs Lab 01/29/14 0609 01/29/14 0616 01/30/14 0337  WBC 13.9*  --  13.0*  NEUTROABS 10.7*  --  10.5*  HGB 11.8* 14.6 9.9*  HCT 39.0 43.0 32.6*  MCV 82.6  --  84.5  PLT 195  --  201    Cardiac Enzymes: No results for input(s): CKTOTAL, CKMB, CKMBINDEX, TROPONINI in the last 168 hours.  Lipid Panel:  Recent Labs Lab 01/29/14 1216 01/30/14 0337  CHOL  --  114  TRIG 118 102  HDL  --  24*  CHOLHDL  --  4.8  VLDL  --  20  LDLCALC  --  70    CBG: No results for input(s): GLUCAP in the last 168 hours.  Microbiology: Results for orders placed or performed during the hospital encounter of 01/29/14  MRSA PCR Screening     Status: None   Collection Time: 01/29/14 11:28 AM  Result Value Ref Range Status   MRSA by PCR NEGATIVE NEGATIVE Final    Comment:        The GeneXpert  MRSA Assay (FDA approved for NASAL specimens only), is one component of a comprehensive MRSA colonization surveillance program. It is not intended to diagnose MRSA infection nor to guide or monitor treatment for MRSA infections.     Coagulation Studies:  Recent Labs  01/30/14 0337  LABPROT 16.3*  INR 1.30    Imaging: Ct Head Wo Contrast  02/01/2014   CLINICAL DATA:  68 year old female with worsening mental status since 0640 hrs. 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 Initial encounter.  EXAM: CT HEAD WITHOUT CONTRAST  TECHNIQUE: Contiguous axial images were obtained from the base of the skull through the vertex without intravenous contrast.  COMPARISON:  Brain MRI 01/31/2014 and earlier.  FINDINGS: Stable  visualized osseous structures. Stable paranasal sinuses and mastoids. Stable orbit and scalp soft tissues. Calcified atherosclerosis at the skull base.  Evolved cytotoxic edema a in the right MCA territory since 01/30/2014, corresponding to the restricted diffusion identified yesterday. No midline shift or significant mass effect at this time. No malignant hemorrhagic transformation.  Small volume parafalcine subdural hematoma has not significantly changed. No ventriculomegaly.  New abnormal hyperdensity at the tip of the basilar artery seen on series 2, images 13 and 14. Suspect the left P1 segment might also be affected. There is subtle new hypodensity in the right thalamus associated with this finding (series 2, image 15). Questionable Mild hypodensity in the left thalamus (same image).  Stable gray-white matter differentiation elsewhere. No other suspicious intracranial vascular hyperdensity. No ventriculomegaly. No new intracranial hemorrhage.  IMPRESSION: 1. 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 Early edema suspected in the thalami right greater than left. 2. No acute intracranial hemorrhage. Stable small parafalcine subdural hematoma. 3. Expected evolution of the acute right MCA infarct, with underlying chronic right MCA and right cerebellar infarcts. Critical Value/emergent results were called by telephone at the time of interpretation on 02/01/2014 at 0758 hrs to Dr. Alexis Goodell , who verbally acknowledged these results.   Electronically Signed   By: Lars Pinks M.D.   On: 02/01/2014 08:01   Mr Brain Wo Contrast  01/31/2014   CLINICAL DATA:  Right MCA territory infarcts. Status post revascularization of the right middle cerebral artery  EXAM: MRI HEAD WITHOUT CONTRAST  TECHNIQUE: Multiplanar, multiecho pulse sequences of the brain and surrounding structures were obtained without intravenous contrast.  COMPARISON:  CT head 01/30/2014. CT perfusion and CT  angiogram 01/29/2014.  FINDINGS: The diffusion-weighted images demonstrate an acute infarct involving the posterior right MCA territory. The superior right posterior temporal lobe is involved. The insular ribbon is involved. The right frontal operculum is involved with additional involvement of the right frontal lobe superiorly. Anterior frontal lobe is spared. The basal ganglia are spared.  T2 changes are associated with the areas of acute infarct. More remote infarcts are noted anteriorly in the right frontal lobe and in the right parietal lobe. There is no acute hemorrhage. There is sulcal effacement without midline shift. Extensive periventricular white matter changes are noted bilaterally. Remote lacunar infarcts are present in the cerebellum bilaterally, worse on the right. The white matter changes extend into the brainstem.  Flow is present in the major intracranial arteries. The globes and orbits are intact. The paranasal sinuses are clear. There is some fluid in the mastoid air cells bilaterally. No obstructing nasopharyngeal lesion is evident.  IMPRESSION: 1. Acute nonhemorrhagic infarct involving the superior right temporal lobe, right insular ribbon, right frontal operculum, and posterior right frontal lobe. 2.  More remote infarcts are again noted in the more anterior inferior right frontal lobe and the right parietal lobe. 3. Extensive white matter changes bilaterally likely reflect the sequela of chronic microvascular ischemia, advanced for age. 4. White matter changes extend into the brainstem. 5. Remote tender infarcts of the cerebellum bilaterally, worse on the right.   Electronically Signed   By: Lawrence Santiago M.D.   On: 01/31/2014 18:35    Medications:  I have reviewed the patient's current medications. Scheduled: . amiodarone  400 mg Oral BID  . antiseptic oral rinse  7 mL Mouth Rinse BID  . aspirin  300 mg Rectal Daily   Or  . aspirin EC  325 mg Oral Daily  . chlorhexidine  15 mL  Mouth Rinse BID  . citalopram  20 mg Oral QHS  . enoxaparin (LOVENOX) injection  40 mg Subcutaneous Q24H  . ipratropium  0.5 mg Nebulization TID  . levalbuterol  0.63 mg Nebulization TID  . levothyroxine  150 mcg Oral QAC breakfast  . lidocaine      . metoprolol  100 mg Oral BID  . multivitamin with minerals  1 tablet Oral Daily  . pantoprazole  40 mg Oral Daily  . pravastatin  10 mg Oral q1800  . verapamil  180 mg Oral Daily    Assessment/Plan: 68 year old female s/p revascularization of her right M1.  This AM became unresponsive with decerebrate posturing.  Patient sent for STAT head CT and this was reviewed personally with radiology.  CT shows the right MCA infarct and a hyperdense tip of the basilar.  This is consistent with the patient's clinical picture.    Recommendations: 1.  CCM called for emergent intubation of the patient.  Patient slowly beginning to desaturate 2.  IR called for possible revascularization of her basilar artery.  IR has agreed to the procedure and patient to be emergently taken to IR after intubation.  3.  Once returns, routine labs to be sent (chem7 and CBC) and will determine need to restart amiodarone drip. 4.  Transfer to NICU  This patient is critically ill and at significant risk of neurological worsening, death and care requires constant monitoring of vital signs, hemodynamics,respiratory and cardiac monitoring, neurological assessment, discussion with family, other specialists and medical decision making of high complexity. I spent 60 minutes of neurocritical care time  in the care of  this patient.  Alexis Goodell, MD [facility] [phone] 02/01/2014  8:37 AM    LOS: 3 days

## 2014-02-01 NOTE — Progress Notes (Signed)
PT Cancellation Note  Patient Details Name: Summer Bradley MRN: [id number] DOB: 04-Feb-1946   Cancelled Treatment:    Reason Eval/Treat Not Completed: Medical issues which prohibited therapy.  Pt unresponsive and transferred to 64m.  Will hold PT at this time and f/u as appropriate.     Vaden Becherer, Thornton Papas 02/01/2014, 8:18 AM

## 2014-02-01 NOTE — Progress Notes (Signed)
Ist puncture in IR 2- code stroke

## 2014-02-01 NOTE — Procedures (Signed)
Intubation Procedure Note Summer Bradley [id number] 03/22/1945  Procedure: Intubation Indications: Respiratory insufficiency  Procedure Details Consent: Risks of procedure as well as the alternatives and risks of each were explained to the (patient/caregiver).  Consent for procedure obtained. Time Out: Verified patient identification, verified procedure, site/side was marked, verified correct patient position, special equipment/implants available, medications/allergies/relevent history reviewed, required imaging and test results available.  Performed  MAC and 3 Medications:  Fentanyl 100 mcg Etomidate 20 mg Versed 2 mg NMB    Evaluation Hemodynamic Status: BP stable throughout; O2 sats: stable throughout Patient's Current Condition: stable Complications: No apparent complications Patient did tolerate procedure well. Chest X-ray ordered to verify placement.  CXR: pending.   Richardson Landry Minor ACNP Maryanna Shape PCCM Pager [phone] till 3 pm If no answer page [phone]  Supervised by me   Rigoberto Noel. MD 02/01/2014, 8:31 AM

## 2014-02-01 NOTE — Progress Notes (Signed)
STROKE TEAM PROGRESS NOTE   HISTORY Summer Bradley is a 68 y.o. female who got up around 4:30 AM 01/29/2014 to go to the bathroom and then sat down in her chair. She is not certain if her left arm was working at that time or not. She then fell out of her chair around 5 AM and that time noticed that her left arm wasn't working. She did not realize that anything was wrong until she fell out of her chair.  She was in her normal state of health ongoing to bed the evening prior. She was last known well at 8p on 01/28/2014.  In the ER, a CT was obtained which shows evidence of a previous stroke, and a CT perfusion scan was obtained which shows a significant mismatch demonstrating an area of penumbra, though there is an smaller area of likely infarct core as well.  Patient was not administered TPA secondary to being  outside of the tPA window with question of subdural hematoma on CT. Due to mismatch, she was taken to neuro intervention where she had complete revascularization of RT MCA M1 occlusion using 2 passes with the Solitaire and 10 mg of IA Integrelin. TICI 3 flow reesablished. Afterwards, she was admitted to the neuro ICU for further evaluation and treatment.   SUBJECTIVE (INTERVAL HISTORY) The patient has had a recent sudden change in mental status. The patient is comatose, cannot be aroused, transferred to neuroscience intensive care unit.   OBJECTIVE Temp:  [98.2 F (36.8 C)-99.3 F (37.4 C)] 98.5 F (36.9 C) (12/17 0453) Pulse Rate:  [62-126] 126 (12/17 0723) Cardiac Rhythm:  [-]  Resp:  [17-38] 18 (12/17 0453) BP: (123-177)/(53-99) 177/96 mmHg (12/17 0723) SpO2:  [84 %-97 %] 95 % (12/17 0453)  No results for input(s): GLUCAP in the last 168 hours.  Recent Labs Lab 01/29/14 0609 01/29/14 0616 01/30/14 0337  NA 140 139 140  K 4.2 3.9 4.2  CL 100 101 105  CO2 24  --  24  GLUCOSE 128* 131* 101*  BUN 15 17 11   CREATININE 0.76 0.80 0.72  CALCIUM 9.2  --  7.8*  MG  --   --  1.6   PHOS  --   --  2.2*    Recent Labs Lab 01/29/14 0609  AST 19  ALT 17  ALKPHOS 61  BILITOT 1.7*  PROT 7.0  ALBUMIN 3.2*    Recent Labs Lab 01/29/14 0609 01/29/14 0616 01/30/14 0337  WBC 13.9*  --  13.0*  NEUTROABS 10.7*  --  10.5*  HGB 11.8* 14.6 9.9*  HCT 39.0 43.0 32.6*  MCV 82.6  --  84.5  PLT 195  --  201   No results for input(s): CKTOTAL, CKMB, CKMBINDEX, TROPONINI in the last 168 hours.  Recent Labs  01/30/14 0337  LABPROT 16.3*  INR 1.30   No results for input(s): COLORURINE, LABSPEC, PHURINE, GLUCOSEU, HGBUR, BILIRUBINUR, KETONESUR, PROTEINUR, UROBILINOGEN, NITRITE, LEUKOCYTESUR in the last 72 hours.  Invalid input(s): APPERANCEUR     Component Value Date/Time   CHOL 114 01/30/2014 0337   TRIG 102 01/30/2014 0337   HDL 24* 01/30/2014 0337   CHOLHDL 4.8 01/30/2014 0337   VLDL 20 01/30/2014 0337   LDLCALC 70 01/30/2014 0337   Lab Results  Component Value Date   HGBA1C 6.4* 01/30/2014      Component Value Date/Time   LABOPIA NONE DETECTED 01/29/2014 0659   COCAINSCRNUR NONE DETECTED 01/29/2014 [place] DETECTED 01/29/2014 1610  AMPHETMU NONE DETECTED 01/29/2014 0659   THCU NONE DETECTED 01/29/2014 0659   LABBARB NONE DETECTED 01/29/2014 0659     Recent Labs Lab 01/29/14 0609  ETH <11    Ct Head Wo Contrast  01/30/2014    1. Resolved right MCA territory hyperdensity present at 1056 hrs yesterday, most likely was post Neurointervention parenchymal contrast staining. 2. Mild cytotoxic edema suspected in that same right MCA territory. Underlying chronic ischemic changes. 3. Stable parafalcine subdural hematoma. No new intracranial hemorrhage or acute mass effect. 4. No new intracranial abnormality.    01/29/2014    Acute infarct right MCA territory as noted on CT perfusion study. There is interval development of high density in the right temporoparietal cortex which may be due to contrast material or hemorrhage. Continued follow-up  recommended.  Small interhemispheric subdural hematoma is unchanged.    01/29/2014   1. 4 mm thick posterior falx subdural hemorrhage. 2. Prominent density at the right M1/M2 junction. CTA is already planned. 3. Established infarcts in the anterior and posterior right MCA territory. No definitive acute infarct. 4. Left frontal scalp contusion.  No acute fracture.    02/01/14: Stat CT scan shows no acute changes in the brain, but there appears to be a thrombus in the basilar artery that is new.   Ct Cerebral Perfusion W/cm 01/29/2014    1. Occlusive thrombus at the right M1-M2 junction. Subjectively good pial/pial collateral circulation. 2. Perfusion findings suggest moderate acute infarct in the central right MCA territory with rim of penumbra, as above. 3. Remote infarcts at the anterior and posterior margins of the right MCA territory. 4. Thin posterior falx subdural hemorrhage.     Cerebral Angiogram 01/29/2014 S/P Bilateral common carotid arteriograms ,followed by complete revascularization of RT MCA M1 occlusion usingX2 passes with the Solitaire FR 17mm x 64mm retrieval device And 10 mg of  superselective intracranial IA Integrelin. TICI 3 flow reesablished.   MRI brain 01/31/14: IMPRESSION: 1. Acute nonhemorrhagic infarct involving the superior right temporal lobe, right insular ribbon, right frontal operculum, and posterior right frontal lobe. 2. More remote infarcts are again noted in the more anterior inferior right frontal lobe and the right parietal lobe. 3. Extensive white matter changes bilaterally likely reflect the sequela of chronic microvascular ischemia, advanced for age. 4. White matter changes extend into the brainstem. 5. Remote tender infarcts of the cerebellum bilaterally, worse on the right.  Dg Chest Port 1 View 01/30/2014    COPD with bibasilar scarring with superimposed subsegmental atelectasis. There is no evidence of CHF.    01/29/2014    Bilateral  perihilar/infrahilar opacification likely interstitial edema and less likely infection.  Endotracheal tube with tip 5.2 cm above the carina.      2D Echocardiogram  EF 50-55% with no source of embolus.    Physical Exam  General: The patient is currently comatose, decerebrate. The patient is moderately obese. The patient has ecchymosis over the left brow.  Respiratory: Lung fields are clear  Cardiovascular: Occasional irregular heart rhythm, no murmurs noted  Abdomen: Soft  Skin: No significant peripheral edema is noted.   Neurologic Exam  Mental status: The patient is comatose.  Cranial nerves: Facial symmetry is not present. Patient has some depression of the left nasolabial fold. Pupils are equal, trace reactive, doll's eyes are absent, the patient does have corneal reflexes bilaterally. Some grimace to nasopharyngeal stimulation is seen.  Motor: The patient not follow commands for motor testing. The patient is decerebrate bilaterally with  sternal rub.  Sensory examination: CanNot be tested  Coordination: Cannot be tested  Gait and station: Gait was not tested.  Reflexes: Deep tendon reflexes are symmetric., Bilateral Babinski's are seen.    ASSESSMENT/PLAN Ms. Alika Eppes is a 68 y.o. female with history of hypertension, stroke, afib and COPD presenting with Left-sided weakness. She did not receive IV t-PA due to delay in arrival, ? SDH.   Stroke:  Suspect right MCA infarct status post complete vascularization of right MCA M1 occlusion with IA Integrilin and mechanical thrombectomy, workup underway.   2D Echo  No source of embolus  HgbA1c 6.4  SCDs for VTE prophylaxis  DIET DYS 2   aspirin 81 mg orally every day and warfarin prior to admission, added aspirin PR or PO. Anticoagulation was considered prior to discharge  Ongoing aggressive stroke risk factor management  Therapy recommendations:    Acute Respiratory Failure  Intubated for neuro  intervention  Critical care managed ventilator  Extubated 01/30/2014  Atrial Fibrillation  Home meds:  Warfarin  INR subtherapeutic on admission, INR 1.18    placed on amiodarone for rate control in setting of hypertension   Hypertension  Systolic blood pressure less than 100 post procedure. Given IV fluid bolus. Hypotension resolved  Continue to monitor  Hyperlipidemia  Home meds:  Mevacor  LDL 70, goal < 70  Reason Mevacor once able to swallow  Other Stroke Risk Factors  Advanced age  Obesity, Body mass index is 35.15 kg/(m^2).   Hx stroke/TIA  Other Active Problems  COPD  Depression, on chronic SSRI  Hospital day # 3   The patient has made a good recovery following the clot extraction. Unfortunate, the patient has sustained another embolic stroke, this time involving the basilar artery. The patient is now comatose, decerebrate. Plans are for intubation, possible clot extraction with Dr. Estanislado Pandy. The patient has been transferred to the intensive care unit.   Lenor Coffin [phone]   To contact Stroke Continuity provider, please refer to [email or web address] After hours, contact General Neurology

## 2014-02-01 NOTE — Progress Notes (Signed)
RT asisted with transporting PT from [facility] CT to [facility] 3M05- on vent at 100% Fi02- uneventful. RN remains at bedside.

## 2014-02-01 NOTE — Progress Notes (Addendum)
Rehab admissions - Noted patient now back on the vent.  I will follow along from a distance for potential inpatient rehab needs.  Call me for questions.  [phone]  I received a call from [facility] carrier.  They have denied inpatient rehab admission.  They feel that she cannot tolerate intense therapies and they feel that SNF will be better option for this patient.  I will follow up next week and see how patient is progressing.  Call me for questions.  #[phone]

## 2014-02-01 NOTE — Progress Notes (Signed)
OT Cancellation Note  Patient Details Name: Stephana Morell MRN: [id number] DOB: 1946-01-06   Cancelled Treatment:    Reason Eval/Treat Not Completed: Patient not medically ready;Other (comment) (transfer from 4north to 62m - await orders to resume) Please reorder OT when appropriate. Pt currently intubated with new CT scan results  Peri Maris  Pager: [phone]  02/01/2014, 8:53 AM

## 2014-02-01 NOTE — Progress Notes (Signed)
INITIAL NUTRITION ASSESSMENT  DOCUMENTATION CODES Per approved criteria  -Obesity Unspecified   INTERVENTION: If pt remains intubated recommend  initiate Vital High Protein @ 35 ml/hr via OG tube    30 ml Prostat BID.    Tube feeding regimen provides 1040 kcal (65% of needs), 103 grams of protein, and 702 ml of H2O.   NUTRITION DIAGNOSIS: Inadequate oral intake related to inability to eat as evidenced by NPO status  Goal: Enteral nutrition to provide 60-70% of estimated calorie needs (22-25 kcals/kg ideal body weight) and 100% of estimated protein needs, based on ASPEN guidelines for hypocaloric, high protein feeding in critically ill obese individuals  Monitor:  TF initiation and tolerance, weight trends, labs  Reason for Assessment: Ventilator  68 y.o. female  Admitting Dx: Stroke  ASSESSMENT: Acute R MCA CVA, s/p angiography, R MCA clot retrieval 12/14 with new stroke 12/17 and back to IR intervention.  Patient is currently intubated on ventilator support MV: 10.3 L/min Temp (24hrs), Avg:98.7 F (37.1 C), Min:98.2 F (36.8 C), Max:99.3 F (37.4 C)  Propofol: off  Height: Ht Readings from Last 1 Encounters:  01/29/14 5\' 2"  (1.575 m)    Weight: Wt Readings from Last 1 Encounters:  01/29/14 192 lb 3.9 oz (87.2 kg)  no new weight  Ideal Body Weight: 50 kg   % Ideal Body Weight: 174%  Wt Readings from Last 10 Encounters:  01/29/14 192 lb 3.9 oz (87.2 kg)    Usual Body Weight: unknown  % Usual Body Weight: -  BMI:  Body mass index is 35.15 kg/(m^2).  Estimated Nutritional Needs: Kcal: 1603 Protein: >/= 100 grams Fluid: > 1.6 L/day  Skin: WDL  Diet Order: DIET DYS 2  EDUCATION NEEDS: -No education needs identified at this time   Intake/Output Summary (Last 24 hours) at 02/01/14 1144 Last data filed at 02/01/14 1045  Gross per 24 hour  Intake    700 ml  Output      0 ml  Net    700 ml    Last BM: PTA   Labs:   Recent Labs Lab  01/29/14 0609 01/29/14 0616 01/30/14 0337  NA 140 139 140  K 4.2 3.9 4.2  CL 100 101 105  CO2 24  --  24  BUN 15 17 11   CREATININE 0.76 0.80 0.72  CALCIUM 9.2  --  7.8*  MG  --   --  1.6  PHOS  --   --  2.2*  GLUCOSE 128* 131* 101*    CBG (last 3)  No results for input(s): GLUCAP in the last 72 hours.  Scheduled Meds: . amiodarone  400 mg Oral BID  . antiseptic oral rinse  7 mL Mouth Rinse BID  . aspirin  300 mg Rectal Daily   Or  . aspirin EC  325 mg Oral Daily  . ceFAZolin      . chlorhexidine  15 mL Mouth Rinse BID  . citalopram  20 mg Oral QHS  . enoxaparin (LOVENOX) injection  40 mg Subcutaneous Q24H  . ipratropium  0.5 mg Nebulization TID  . levalbuterol  0.63 mg Nebulization TID  . levothyroxine  150 mcg Oral QAC breakfast  . lidocaine      . metoprolol  100 mg Oral BID  . multivitamin with minerals  1 tablet Oral Daily  . pantoprazole  40 mg Oral Daily  . pravastatin  10 mg Oral q1800    Continuous Infusions:   Past Medical History  Diagnosis Date  . Hypertension   . Stroke   . A-fib   . COPD (chronic obstructive pulmonary disease)     Past Surgical History  Procedure Laterality Date  . Abdominal surgery    . Radiology with anesthesia N/A 01/29/2014    Procedure: RADIOLOGY WITH ANESTHESIA;  Surgeon: Luanne Bras, MD;  Location: [facility];  Service: Radiology;  Laterality: N/A;    Maylon Peppers RD, [place], [place] Pager [phone] After Hours Pager

## 2014-02-01 NOTE — Progress Notes (Addendum)
PULMONARY / CRITICAL CARE MEDICINE   Name: Summer Bradley MRN: [id number] DOB: [birth year]    ADMISSION DATE:  01/29/2014 CONSULTATION DATE:  12/14  REFERRING MD : Neuro  CHIEF COMPLAINT: Left sided weakness      HISTORY OF PRESENT ILLNESS:   68 yo WF who devloped neuro changes this am 0430 with left side weakness that resulted in a fall. Transported via EMS to [facility] where she was taken to IR and rt mca clot removal and revascularization procedure was performed. Transported tp NSICU on vent  Following procedure. PCCM asked to manage vent.  STUDIES:  12/14 >> 12/17 >> head CT - evolving right MCA infarct and new hyperdense tip of the basilar  SIGNIFICANT EVENTS: 12/14 rt mca clot with IR intervention 12/17 reintubated  SUBJECTIVE: Unresponsive this am, HR dropped to 30s then improved- Af  VITAL SIGNS: Temp:  [98.2 F (36.8 C)-99.3 F (37.4 C)] 98.5 F (36.9 C) (12/17 0453) Pulse Rate:  [62-126] 126 (12/17 0723) Resp:  [17-38] 18 (12/17 0453) BP: (123-177)/(53-99) 177/96 mmHg (12/17 0723) SpO2:  [84 %-97 %] 95 % (12/17 0453) HEMODYNAMICS:   VENTILATOR SETTINGS:   INTAKE / OUTPUT:  Intake/Output Summary (Last 24 hours) at 02/01/14 0918 Last data filed at 01/31/14 1200  Gross per 24 hour  Intake   33.4 ml  Output      0 ml  Net   33.4 ml    PHYSICAL EXAMINATION: General:  EWF  unresponsiveLeft periorbital ecchymosis from fall Neuro:   does not follow commands, HEENT: pupils 57mm BERTL, no JVD Cardiovascular:  HSIR IR Lungs: CTA Abdomen:  Obese, large midline scar Musculoskeletal:  intact Skin:  Warm , no lower ext edema  LABS:  CBC  Recent Labs Lab 01/29/14 0609 01/29/14 0616 01/30/14 0337  WBC 13.9*  --  13.0*  HGB 11.8* 14.6 9.9*  HCT 39.0 43.0 32.6*  PLT 195  --  201   Coag's  Recent Labs Lab 01/29/14 0609 01/30/14 0337  APTT 33 38*  INR 1.18 1.30   BMET  Recent Labs Lab 01/29/14 0609 01/29/14 0616 01/30/14 0337  NA 140 139 140  K  4.2 3.9 4.2  CL 100 101 105  CO2 24  --  24  BUN 15 17 11   CREATININE 0.76 0.80 0.72  GLUCOSE 128* 131* 101*   Electrolytes  Recent Labs Lab 01/29/14 0609 01/30/14 0337  CALCIUM 9.2 7.8*  MG  --  1.6  PHOS  --  2.2*   Sepsis Markers No results for input(s): LATICACIDVEN, PROCALCITON, O2SATVEN in the last 168 hours. ABG  Recent Labs Lab 01/29/14 1255  PHART 7.282*  PCO2ART 54.5*  PO2ART 67.7*   Liver Enzymes  Recent Labs Lab 01/29/14 0609  AST 19  ALT 17  ALKPHOS 61  BILITOT 1.7*  ALBUMIN 3.2*   Cardiac Enzymes No results for input(s): TROPONINI, PROBNP in the last 168 hours. Glucose No results for input(s): GLUCAP in the last 168 hours.  Imaging Mr Brain Wo Contrast  01/31/2014   CLINICAL DATA:  Right MCA territory infarcts. Status post revascularization of the right middle cerebral artery  EXAM: MRI HEAD WITHOUT CONTRAST  TECHNIQUE: Multiplanar, multiecho pulse sequences of the brain and surrounding structures were obtained without intravenous contrast.  COMPARISON:  CT head 01/30/2014. CT perfusion and CT angiogram 01/29/2014.  FINDINGS: The diffusion-weighted images demonstrate an acute infarct involving the posterior right MCA territory. The superior right posterior temporal lobe is involved. The insular ribbon is involved. The  right frontal operculum is involved with additional involvement of the right frontal lobe superiorly. Anterior frontal lobe is spared. The basal ganglia are spared.  T2 changes are associated with the areas of acute infarct. More remote infarcts are noted anteriorly in the right frontal lobe and in the right parietal lobe. There is no acute hemorrhage. There is sulcal effacement without midline shift. Extensive periventricular white matter changes are noted bilaterally. Remote lacunar infarcts are present in the cerebellum bilaterally, worse on the right. The white matter changes extend into the brainstem.  Flow is present in the major  intracranial arteries. The globes and orbits are intact. The paranasal sinuses are clear. There is some fluid in the mastoid air cells bilaterally. No obstructing nasopharyngeal lesion is evident.  IMPRESSION: 1. Acute nonhemorrhagic infarct involving the superior right temporal lobe, right insular ribbon, right frontal operculum, and posterior right frontal lobe. 2. More remote infarcts are again noted in the more anterior inferior right frontal lobe and the right parietal lobe. 3. Extensive white matter changes bilaterally likely reflect the sequela of chronic microvascular ischemia, advanced for age. 4. White matter changes extend into the brainstem. 5. Remote tender infarcts of the cerebellum bilaterally, worse on the right.   Electronically Signed   By: Lawrence Santiago M.D.   On: 01/31/2014 18:35     ASSESSMENT / PLAN:  NEUROLOGIC A:   Rt MCA blockage with left sided weakness on admit, post rt mca clot removal and intracranial integrelin.  New acute basilar thrombosis. P:   RASS goal: 0 Rpt IR procedure planned emergently Per Neuro Propofol gt + int fent   PULMONARY OETT 12/14>>12/15, 12/17 >> A: VDRF secondary to RT MCA stroke with revascularization  COPD P:   Vent bundle BD as needed   CARDIOVASCULAR CVL A:  HTN A fib Post intracranial Integrelin 12/14 0950 2 d echo  - mod MR, nml LV fn Brady episode 12/17 on metoprolol /amio & verapamil P:  Maintain SBP as instructed per neuro sbp 120-130 range. Rate control as needed with metoprolol & amio -hold verapamil for 24h in v/o brady episode (likely stroke related) No anticoagulation SCD   RENAL A:  No acute issue P:     GASTROINTESTINAL A:   NPO GI protection P:   NPO PPI  HEMATOLOGIC A:   Hx of Afib. Unknown if on anticoagulation. INR not elevated P:  Defer anticoagulation to neuro  INFECTIOUS A:  No overt infectious process P:    ENDOCRINE A:   Hyperglycemic  P:   SSI HBG a 1 c    FAMILY   - Updates:   - Inter-disciplinary family meet or Palliative Care meeting due by:  day 7    TODAY'S SUMMARY: 68 yo female s/p  Right MCA revascularization for Rt MCA infarct now with acute basilar thrombosis  -presumed embolic, another IR revasc planned  The patient is critically ill with multiple organ systems failure and requires high complexity decision making for assessment and support, frequent evaluation and titration of therapies, application of advanced monitoring technologies and extensive interpretation of multiple databases. Critical Care Time devoted to patient care services described in this note independent of APP time is 35 minutes.    Rigoberto Noel MD   02/01/2014, 9:18 AM

## 2014-02-01 NOTE — Progress Notes (Signed)
Corlis Leak, RRT , RCP assisted with PT intubation and transported PT to IR.

## 2014-02-01 NOTE — Procedures (Signed)
S/P bilateral common caroid arteriogram and bilateral vertebral arteriograms,followed by complete TICI 3 revascularizationof distal third of basilar artery and PCAS using 8 mg of IA Integrelin and x1 pass with Solitaire FR 4 mm x 40 mm retrieval device.

## 2014-02-01 NOTE — Progress Notes (Signed)
Patient having difficulty voiding, NT bladder scanned patient and amount of 326 ml in bladder.

## 2014-02-01 NOTE — Anesthesia Postprocedure Evaluation (Signed)
  Anesthesia Post-op Note  Patient: Summer Bradley  Procedure(s) Performed: Procedure(s): RADIOLOGY WITH ANESTHESIA (N/A)  Patient Location: ICU  Anesthesia Type:General  Level of Consciousness: sedated and unresponsive  Airway and Oxygen Therapy: Patient remains intubated and on ventilator  Post-op Pain: none  Post-op Assessment: Post-op Vital signs reviewed, Patient's Cardiovascular Status Stable and Respiratory Function Stable  Post-op Vital Signs: Reviewed  Filed Vitals:   02/01/14 1230  BP: 125/83  Pulse: 116  Temp:   Resp: 25    Complications: No apparent anesthesia complications

## 2014-02-01 NOTE — Progress Notes (Signed)
At 645 am, patient began to have a. Fib in the 140s and 150s. Patient also began to have difficulty breathing and became unresponsive. Patient eyes in downward glance with no reaction. Patient also postured during painful stimuli. . Vitals taken. Patient was 84/4L. Notified Rapid Response. Patient placed on non-rebreather, EKG taken, and notified Dr. Doy Mince to see. Patient also had stat CT ordered.   Patient has been anxious through night, since after MRI saying that she needed medicine and that she wanted me to call the doctor to request something to help her sleep.  Patient has received two doses of Ativan 1 mg IV (2055 and 237), which after both times patient was coherent. Prior to 645 am, patient was coherent and following commands.   Given report to [facility] in [place] ICU

## 2014-02-01 NOTE — Progress Notes (Signed)
Recanalization time in IR 2

## 2014-02-01 NOTE — Anesthesia Preprocedure Evaluation (Signed)
Anesthesia Evaluation  Patient identified by MRN, date of birth, ID band Patient unresponsive    Reviewed: Allergy & Precautions, H&P , NPO status , Patient's Chart, lab work & pertinent test results, reviewed documented beta blocker date and time Preop documentation limited or incomplete due to emergent nature of procedure.  Airway Mallampati: Intubated       Dental no notable dental hx. (+) Teeth Intact, Dental Advisory Given   Pulmonary neg pulmonary ROS, COPDformer smoker,  breath sounds clear to auscultation  Pulmonary exam normal       Cardiovascular hypertension, Pt. on medications and Pt. on home beta blockers + Peripheral Vascular Disease + dysrhythmias Atrial Fibrillation Rhythm:Irregular Rate:Tachycardia     Neuro/Psych CVA negative psych ROS   GI/Hepatic negative GI ROS, Neg liver ROS,   Endo/Other  negative endocrine ROS  Renal/GU negative Renal ROS  negative genitourinary   Musculoskeletal   Abdominal   Peds  Hematology negative hematology ROS (+)   Anesthesia Other Findings   Reproductive/Obstetrics negative OB ROS                             Anesthesia Physical Anesthesia Plan  ASA: IV and emergent  Anesthesia Plan: General   Post-op Pain Management:    Induction: Intravenous  Airway Management Planned: Oral ETT  Additional Equipment:   Intra-op Plan:   Post-operative Plan: Post-operative intubation/ventilation  Informed Consent: I have reviewed the patients History and Physical, chart, labs and discussed the procedure including the risks, benefits and alternatives for the proposed anesthesia with the patient or authorized representative who has indicated his/her understanding and acceptance.   Dental advisory given  Plan Discussed with: CRNA  Anesthesia Plan Comments:         Anesthesia Quick Evaluation

## 2014-02-01 NOTE — Progress Notes (Signed)
Interventional Radiology here at bedside to remove 9Fr right groin sheath.  Pt identified by name and DOB. chlorhexadine swab stick used to clean sheath and groin.  Exoseal used with success to close groin.  Pressure held for 57mins until hemostasis achieved.  V-Pad applied with tegaderm and pressure dressing. RN Romelle Starcher and bedside.  Distal pulses DP+2, PT+1    jkc

## 2014-02-01 NOTE — Progress Notes (Signed)
SLP Cancellation Note  Patient Details Name: Summer Bradley MRN: [id number] DOB: 19-Oct-1945   Cancelled treatment:        Pt. With A-fib, respiratory difficulty and now on vent.  Will check on status next date.   [name] 02/01/2014, 12:24 PM   Orbie Pyo Colvin Caroli.Ed [employer] [phone]

## 2014-02-01 NOTE — Progress Notes (Signed)
Reintubated for new stroke and for IR intervention 12/17  Filed Vitals:   02/01/14 0129 02/01/14 0400 02/01/14 0453 02/01/14 0723  BP: 125/71  161/99 177/96  Pulse: 79  85 126  Temp: 98.2 F (36.8 C)  98.5 F (36.9 C)   TempSrc: Oral  Oral   Resp: 20 17 18    Height:      Weight:      SpO2: 93%  95%    Small abrasion around L eye with steri strip No JVD Rhonchi bilateral. Clear with OTT RRR s M NABS, soft Ext warm without edema Extends to noxious stimuli , downward gaze preference    CXR: Pending Ct Head Wo Contrast  02/01/2014   CLINICAL DATA:  68 year old female with worsening mental status since 0640 hrs. 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 Initial encounter.  EXAM: CT HEAD WITHOUT CONTRAST  TECHNIQUE: Contiguous axial images were obtained from the base of the skull through the vertex without intravenous contrast.  COMPARISON:  Brain MRI 01/31/2014 and earlier.  FINDINGS: Stable visualized osseous structures. Stable paranasal sinuses and mastoids. Stable orbit and scalp soft tissues. Calcified atherosclerosis at the skull base.  Evolved cytotoxic edema a in the right MCA territory since 01/30/2014, corresponding to the restricted diffusion identified yesterday. No midline shift or significant mass effect at this time. No malignant hemorrhagic transformation.  Small volume parafalcine subdural hematoma has not significantly changed. No ventriculomegaly.  New abnormal hyperdensity at the tip of the basilar artery seen on series 2, images 13 and 14. Suspect the left P1 segment might also be affected. There is subtle new hypodensity in the right thalamus associated with this finding (series 2, image 15). Questionable Mild hypodensity in the left thalamus (same image).  Stable gray-white matter differentiation elsewhere. No other suspicious intracranial vascular hyperdensity. No ventriculomegaly. No new intracranial hemorrhage.  IMPRESSION: 1. New hyperdense  distal basilar artery consistent with acute basilar thrombosis. Early edema suspected in the thalami right greater than left. 2. No acute intracranial hemorrhage. Stable small parafalcine subdural hematoma. 3. Expected evolution of the acute right MCA infarct, with underlying chronic right MCA and right cerebellar infarcts. Critical Value/emergent results were called by telephone at the time of interpretation on 02/01/2014 at 0758 hrs to Dr. Alexis Goodell , who verbally acknowledged these results.   Electronically Signed   By: Lars Pinks M.D.   On: 02/01/2014 08:01   Mr Brain Wo Contrast  01/31/2014   CLINICAL DATA:  Right MCA territory infarcts. Status post revascularization of the right middle cerebral artery  EXAM: MRI HEAD WITHOUT CONTRAST  TECHNIQUE: Multiplanar, multiecho pulse sequences of the brain and surrounding structures were obtained without intravenous contrast.  COMPARISON:  CT head 01/30/2014. CT perfusion and CT angiogram 01/29/2014.  FINDINGS: The diffusion-weighted images demonstrate an acute infarct involving the posterior right MCA territory. The superior right posterior temporal lobe is involved. The insular ribbon is involved. The right frontal operculum is involved with additional involvement of the right frontal lobe superiorly. Anterior frontal lobe is spared. The basal ganglia are spared.  T2 changes are associated with the areas of acute infarct. More remote infarcts are noted anteriorly in the right frontal lobe and in the right parietal lobe. There is no acute hemorrhage. There is sulcal effacement without midline shift. Extensive periventricular white matter changes are noted bilaterally. Remote lacunar infarcts are present in the cerebellum bilaterally, worse on the right. The white matter changes extend into the brainstem.  Flow is  present in the major intracranial arteries. The globes and orbits are intact. The paranasal sinuses are clear. There is some fluid in the mastoid air  cells bilaterally. No obstructing nasopharyngeal lesion is evident.  IMPRESSION: 1. Acute nonhemorrhagic infarct involving the superior right temporal lobe, right insular ribbon, right frontal operculum, and posterior right frontal lobe. 2. More remote infarcts are again noted in the more anterior inferior right frontal lobe and the right parietal lobe. 3. Extensive white matter changes bilaterally likely reflect the sequela of chronic microvascular ischemia, advanced for age. 4. White matter changes extend into the brainstem. 5. Remote tender infarcts of the cerebellum bilaterally, worse on the right.   Electronically Signed   By: Lawrence Santiago M.D.   On: 01/31/2014 18:35    Recent Labs Lab 01/29/14 0609 01/29/14 0616 01/30/14 0337  NA 140 139 140  K 4.2 3.9 4.2  CL 100 101 105  CO2 24  --  24  BUN 15 17 11   CREATININE 0.76 0.80 0.72  GLUCOSE 128* 131* 101*    Recent Labs Lab 01/29/14 0609 01/29/14 0616 01/30/14 0337  HGB 11.8* 14.6 9.9*  HCT 39.0 43.0 32.6*  WBC 13.9*  --  13.0*  PLT 195  --  201     IMPRESSION: Acute R MCA CVA, s/p angiography, R MCA clot retrieval 12/14 with new stroke 12/17 and back to IR intervention.  Acute VDRF, extubated 12/15 but reintubated 12/17>> for new stroke. See vent bundle COPD with recent exacerbation -ome O2 dependent  Chronic PAF - subtherapeutic PT-INR on admission  AFRVR, resolved. NSR after amiodarone  H/O hypertension  Hypothyroidism, chronic  Mild hyperglycemia  H/O depression - chronic SSRI therapy  PLAN/REC: Stroke care per Stroke service Vent bundle after reintubation 12/17 Cont scheduled and PRN metoprolol to maintain HR < 115/min Amiodarone to be transitioned to PO 12/16/ 12/17 per OGT Might benefit from Cardiology eval and F/U (noting that she is scheduled to see a Cardiologist in her home town) Anticoagulation per Stroke service DM mgmt per Stroke Service Resume SSRI when able to take POs if she survives new  stroke   Richardson Landry Claudio Mondry ACNP Maryanna Shape PCCM Pager [phone] till 3 pm If no answer page [phone] 02/01/2014, 8:41 AM

## 2014-02-01 NOTE — Significant Event (Addendum)
Rapid Response Event Note  Overview:  Event Type: Neurologic  Initial Focused Assessment:  Called by primary RN for patient unresponsive at [phone].  Upon my arrival to patient's room, Rn and family at bedside.  Patient lying in bed with nasal cannula 4 LPM, labored breathing, RR 34, Breath sounds course rhonchi with expiratory wheezes.  Audibly gurgly from door, Suctioned mouth -lots of thick secretion suctioned.  HR noted to be 150's A Fib, BP 160's/99, Sats 94% on [place].  Patient unresponsive, posturing of upper arms with sternal rub.  Eyes downward gaze, non reactive   Interventions: Sats dropped to 87-88%, placed on NRB.  RT at bedside.  Dr. Doy Mince paged and updated and at bedside.  EKG done.  Patient transported to CT scan via bed, monitor and oxygen.     Event Summary:  Patient transported to 3 M 05, oxygen changed to NRB for sats dropping to 88%.  Patient on monitor.  Patient intubated by CCM.   at      at          [facility], Harlin Rain

## 2014-02-01 NOTE — Progress Notes (Signed)
DP pulses 2t, PT pulses 1t

## 2014-02-02 ENCOUNTER — Inpatient Hospital Stay (HOSPITAL_COMMUNITY): Payer: Medicare HMO

## 2014-02-02 DIAGNOSIS — I059 Rheumatic mitral valve disease, unspecified: Secondary | ICD-10-CM

## 2014-02-02 DIAGNOSIS — R062 Wheezing: Secondary | ICD-10-CM | POA: Diagnosis present

## 2014-02-02 LAB — CBC WITH DIFFERENTIAL/PLATELET
Basophils Absolute: 0 10*3/uL (ref 0.0–0.1)
Basophils Relative: 0 % (ref 0–1)
Eosinophils Absolute: 0.2 10*3/uL (ref 0.0–0.7)
Eosinophils Relative: 1 % (ref 0–5)
HCT: 29 % — ABNORMAL LOW (ref 36.0–46.0)
Hemoglobin: 8.4 g/dL — ABNORMAL LOW (ref 12.0–15.0)
Lymphocytes Relative: 10 % — ABNORMAL LOW (ref 12–46)
Lymphs Abs: 1.2 10*3/uL (ref 0.7–4.0)
MCH: 24.1 pg — ABNORMAL LOW (ref 26.0–34.0)
MCHC: 29 g/dL — ABNORMAL LOW (ref 30.0–36.0)
MCV: 83.1 fL (ref 78.0–100.0)
Monocytes Absolute: 0.8 10*3/uL (ref 0.1–1.0)
Monocytes Relative: 7 % (ref 3–12)
Neutro Abs: 10.1 10*3/uL — ABNORMAL HIGH (ref 1.7–7.7)
Neutrophils Relative %: 82 % — ABNORMAL HIGH (ref 43–77)
Platelets: 228 10*3/uL (ref 150–400)
RBC: 3.49 MIL/uL — ABNORMAL LOW (ref 3.87–5.11)
RDW: 19.9 % — ABNORMAL HIGH (ref 11.5–15.5)
WBC: 12.3 10*3/uL — ABNORMAL HIGH (ref 4.0–10.5)

## 2014-02-02 LAB — BASIC METABOLIC PANEL
Anion gap: 11 (ref 5–15)
BUN: 13 mg/dL (ref 6–23)
CO2: 25 mEq/L (ref 19–32)
Calcium: 7.9 mg/dL — ABNORMAL LOW (ref 8.4–10.5)
Chloride: 109 mEq/L (ref 96–112)
Creatinine, Ser: 0.61 mg/dL (ref 0.50–1.10)
GFR calc Af Amer: >90 mL/min (ref >90)
GFR calc non Af Amer: >90 mL/min (ref >90)
Glucose, Bld: 92 mg/dL (ref 70–99)
Potassium: 3.7 mEq/L (ref 3.7–5.3)
Sodium: 145 mEq/L (ref 137–147)

## 2014-02-02 LAB — BLOOD GAS, ARTERIAL
Acid-Base Excess: 1.3 mmol/L (ref 0.0–2.0)
Bicarbonate: 25.6 mEq/L — ABNORMAL HIGH (ref 20.0–24.0)
Drawn by: 257701
FIO2: 0.5 %
MECHVT: 400 mL
O2 Saturation: 97.2 %
PEEP: 5 cmH2O
Patient temperature: 98.6
RATE: 14 resp/min
TCO2: 26.9 mmol/L (ref 0–100)
pCO2 arterial: 42.3 mmHg (ref 35.0–45.0)
pH, Arterial: 7.399 (ref 7.350–7.450)
pO2, Arterial: 87.1 mmHg (ref 80.0–100.0)

## 2014-02-02 LAB — GLUCOSE, CAPILLARY
Glucose-Capillary: 76 mg/dL (ref 70–99)
Glucose-Capillary: 88 mg/dL (ref 70–99)

## 2014-02-02 MED ORDER — BUDESONIDE 0.25 MG/2ML IN SUSP
0.2500 mg | Freq: Four times a day (QID) | RESPIRATORY_TRACT | Status: DC
  Administered 2014-02-02 – 2014-02-05 (×11): 0.25 mg via RESPIRATORY_TRACT
  Filled 2014-02-02 (×16): qty 2

## 2014-02-02 MED ORDER — VITAL HIGH PROTEIN PO LIQD
1000.0000 mL | ORAL | Status: DC
  Filled 2014-02-02 (×2): qty 1000

## 2014-02-02 MED ORDER — PERFLUTREN LIPID MICROSPHERE
1.0000 mL | INTRAVENOUS | Status: AC | PRN
  Administered 2014-02-02: 2 mL via INTRAVENOUS
  Filled 2014-02-02: qty 10

## 2014-02-02 MED ORDER — LEVALBUTEROL HCL 0.63 MG/3ML IN NEBU: 0.6300 mg | INHALATION_SOLUTION | Freq: Four times a day (QID) | RESPIRATORY_TRACT | Status: DC

## 2014-02-02 MED ORDER — VITAL HIGH PROTEIN PO LIQD
1000.0000 mL | ORAL | Status: DC
  Administered 2014-02-02: 22:00:00
  Administered 2014-02-02: 1000 mL
  Administered 2014-02-03 (×2)
  Administered 2014-02-03 – 2014-02-05 (×3): 1000 mL
  Filled 2014-02-02 (×8): qty 1000

## 2014-02-02 MED ORDER — PRO-STAT SUGAR FREE PO LIQD
30.0000 mL | Freq: Two times a day (BID) | ORAL | Status: DC
  Administered 2014-02-02 – 2014-03-05 (×53): 30 mL
  Filled 2014-02-02 (×70): qty 30

## 2014-02-02 MED ORDER — METOPROLOL TARTRATE 50 MG PO TABS
50.0000 mg | ORAL_TABLET | Freq: Three times a day (TID) | ORAL | Status: DC
  Administered 2014-02-02 – 2014-02-06 (×11): 50 mg
  Filled 2014-02-02 (×15): qty 1

## 2014-02-02 MED ORDER — ASPIRIN 325 MG PO TABS
325.0000 mg | ORAL_TABLET | Freq: Every day | ORAL | Status: DC
  Administered 2014-02-02 – 2014-02-05 (×4): 325 mg
  Filled 2014-02-02 (×3): qty 1

## 2014-02-02 MED ORDER — SODIUM CHLORIDE 0.9 % IJ SOLN
10.0000 mL | Freq: Two times a day (BID) | INTRAMUSCULAR | Status: DC
  Administered 2014-02-02: 20 mL
  Administered 2014-02-03 – 2014-02-10 (×14): 10 mL
  Administered 2014-02-10 – 2014-02-11 (×2): 20 mL
  Administered 2014-02-11 – 2014-02-26 (×29): 10 mL
  Administered 2014-02-26 – 2014-02-27 (×2): 30 mL
  Administered 2014-02-27 – 2014-02-28 (×2): 20 mL
  Administered 2014-02-28 – 2014-03-05 (×9): 10 mL

## 2014-02-02 MED ORDER — LEVALBUTEROL HCL 0.63 MG/3ML IN NEBU
0.6300 mg | INHALATION_SOLUTION | Freq: Four times a day (QID) | RESPIRATORY_TRACT | Status: DC
  Administered 2014-02-02 – 2014-02-06 (×18): 0.63 mg via RESPIRATORY_TRACT
  Filled 2014-02-02 (×30): qty 3

## 2014-02-02 MED ORDER — VITAL HIGH PROTEIN PO LIQD
1000.0000 mL | ORAL | Status: DC
  Filled 2014-02-02: qty 1000

## 2014-02-02 MED ORDER — SODIUM CHLORIDE 0.9 % IJ SOLN
10.0000 mL | INTRAMUSCULAR | Status: DC | PRN
  Administered 2014-02-08 – 2014-02-26 (×2): 10 mL
  Filled 2014-02-02 (×2): qty 40

## 2014-02-02 NOTE — Progress Notes (Signed)
PT Cancellation Note  Patient Details Name: Bre Pecina MRN: [id number] DOB: 09-Apr-1945   Cancelled Treatment:    Reason Eval/Treat Not Completed: Patient not medically ready.  Will f/u as appropriate.     Manan Olmo, Thornton Papas 02/02/2014, 7:12 AM

## 2014-02-02 NOTE — Progress Notes (Signed)
Dr. Aram Beecham notified of patient's BP being soft. Order received for bolus in lieu of starting pressors. Bolus effective. Will continue to monitor.

## 2014-02-02 NOTE — Progress Notes (Addendum)
NUTRITION FOLLOW-UP/CONSULT  DOCUMENTATION CODES Per approved criteria  -Obesity Unspecified   INTERVENTION: Initiate Vital High Protein @ 35 ml/hr via OG tube    30 ml Prostat BID.    Tube feeding regimen provides 1040 kcal, 103 grams of protein, and 702 ml of H2O.   TF regimen and propofol at current rate providing 1108 total kcal/day (66 % of kcal needs)   NUTRITION DIAGNOSIS: Inadequate oral intake related to inability to eat as evidenced by NPO status; ongoing.   Goal: Enteral nutrition to provide 60-70% of estimated calorie needs (22-25 kcals/kg ideal body weight) and 100% of estimated protein needs, based on ASPEN guidelines for hypocaloric, high protein feeding in critically ill obese individuals; not met.   Monitor:  TF tolerance, weight trends, labs  ASSESSMENT: Acute R MCA CVA, s/p angiography, R MCA clot retrieval 12/14 with new stroke 12/17 and back to IR intervention.  Patient is currently intubated on ventilator support MV: 12 L/min Temp (24hrs), Avg:99 F (37.2 C), Min:98.1 F (36.7 C), Max:100.6 F (38.1 C)  Propofol: 2.6 ml/hr provides 68 kcal per day from lipid Pt discussed during ICU rounds and with RN.  No family in room. No signs of fat or muscle depletion noted.   Height: Ht Readings from Last 1 Encounters:  02/01/14 _0  (1.575 m)    Weight: Wt Readings from Last 1 Encounters:  02/01/14 198 lb 6.6 oz (90 kg)  Admission weight: 192 lb (87.2 kg) 12/14   BMI:  Body mass index is 36.28 kg/(m^2).  Estimated Nutritional Needs: Kcal: 1673 Protein: >/= 100 grams Fluid: > 1.6 L/day  Skin: incision, laceration  Diet Order:     Intake/Output Summary (Last 24 hours) at 02/02/14 1053 Last data filed at 02/02/14 0900  Gross per 24 hour  Intake   2846 ml  Output   1010 ml  Net   1836 ml    Last BM: PTA   Labs:   Recent Labs Lab 01/29/14 0609 01/29/14 0616 01/30/14 0337 02/02/14 0245  NA 140 139 140 145  K 4.2 3.9 4.2 3.7  CL  100 101 105 109  CO2 24  --  24 25  BUN _1 CREATININE 0.76 0.80 0.72 0.61  CALCIUM 9.2  --  7.8* 7.9*  MG  --   --  1.6  --   PHOS  --   --  2.2*  --   GLUCOSE 128* 131* 101* 92    CBG (last 3)  No results for input(s): GLUCAP in the last 72 hours.  Scheduled Meds: . amiodarone  400 mg Per Tube BID  . antiseptic oral rinse  7 mL Mouth Rinse QID  . aspirin  325 mg Per Tube Daily  . budesonide  0.25 mg Nebulization 4 times per day  . chlorhexidine  15 mL Mouth Rinse BID  . citalopram  20 mg Per Tube QHS  . enoxaparin (LOVENOX) injection  40 mg Subcutaneous Q24H  . feeding supplement (VITAL HIGH PROTEIN)  1,000 mL Per Tube Q24H  . ipratropium  0.5 mg Nebulization TID  . levalbuterol  0.63 mg Nebulization Q6H  . levothyroxine  150 mcg Per Tube QAC breakfast  . metoprolol  50 mg Per Tube 3 times per day  . pantoprazole sodium  40 mg Per Tube Q1200  . pravastatin  10 mg Per Tube q1800  . sodium chloride  10-40 mL Intracatheter Q12H    Continuous Infusions: . propofol 5 mcg/kg/min (02/02/14  0900)    [place], [place], [place] Pager [phone] After Hours Pager

## 2014-02-02 NOTE — Progress Notes (Signed)
  Echocardiogram 2D Echocardiogram has been performed.  Summer Bradley 02/02/2014, 12:47 PM

## 2014-02-02 NOTE — Progress Notes (Signed)
Peripherally Inserted Central Catheter/Midline Placement  The IV Nurse has discussed with the patient and/or persons authorized to consent for the patient, the purpose of this procedure and the potential benefits and risks involved with this procedure.  The benefits include less needle sticks, lab draws from the catheter and patient may be discharged home with the catheter.  Risks include, but not limited to, infection, bleeding, blood clot (thrombus formation), and puncture of an artery; nerve damage and irregular heat beat.  Alternatives to this procedure were also discussed. Consent obtain from daughter PICC/Midline Placement Documentation  PICC / Midline Double Lumen 77/82/42 PICC Right Basilic 38 cm 0 cm (Active)  Indication for Insertion or Continuance of Line Poor Vasculature-patient has had multiple peripheral attempts or PIVs lasting less than 24 hours 02/02/2014 10:38 AM  Exposed Catheter (cm) 0 cm 02/02/2014 10:38 AM  Site Assessment Clean;Dry;Intact 02/02/2014 10:38 AM  Lumen #1 Status Flushed;Saline locked;Blood return noted 02/02/2014 10:38 AM  Lumen #2 Status Flushed;Saline locked;Blood return noted 02/02/2014 10:38 AM  Dressing Type Transparent 02/02/2014 10:38 AM  Dressing Change Due 02/09/14 02/02/2014 10:38 AM       Gordan Payment 02/02/2014, 10:40 AM

## 2014-02-02 NOTE — Progress Notes (Signed)
UR completed. Pt initially planned for potential CIR prior to most recent events. Await medical stability to determine if that will still be an option.  Sandi Mariscal, RN BSN [place] CCM Trauma/Neuro ICU Case Manager [phone]

## 2014-02-02 NOTE — Progress Notes (Signed)
PULMONARY / CRITICAL CARE MEDICINE   Name: Montez Stryker MRN: [id number] DOB: 23-Aug-1945    ADMISSION DATE:  01/29/2014 CONSULTATION DATE:  12/14  REFERRING MD : Neuro  CHIEF COMPLAINT: Left sided weakness  HISTORY OF PRESENT ILLNESS:   33 F with COPD, PAF admitted with acute CVA to Stroke Service after clot retrieval from R MCA  STUDIES/SIGNIFICANT EVENTS: 12/14 CTA head: Occlusive thrombus at the right M1-M2 junction. Subjectively good pial/pial collateral circulation. Perfusion findings suggest moderate acute infarct in the central right MCA territory with rim of penumbra, as above. Remote infarcts at the anterior and posterior margins of the right MCA territory. Thin posterior falx subdural hemorrhage  12/14 admitted to stroke  service after Neuro IR clot retrieval from R MCA. Intubated for procedure. PCCM consultation 12/14 CT head: Acute infarct right MCA territory as noted on CT perfusion study. There is interval development of high density in the right  temporoparietal cortex which may be due to contrast material or hemorrhage.  12/14 Echocardiogram: Lvef 50-55% 12/15 Ct head: Mild cytotoxic edema suspected in that same right MCA territory. Underlying chronic ischemic changes. Stable parafalcine subdural hematoma. No new intracranial hemorrhage or acute mass effect. No new intracranial abnormality 12/15 Extubated 12/16 Transferred out of ICU. PCCM signed off 12/16 MRI brain: Acute nonhemorrhagic infarct involving the superior right temporal lobe, right insular ribbon, right frontal operculum, and posterior right frontal lobe. More remote infarcts are again noted in the more anterior inferior right frontal lobe and the right parietal lobe. Extensive white matter changes bilaterally likely reflect the sequela of chronic microvascular ischemia, advanced for age. White matter changes extend into the brainstem. Remote tender infarcts of the cerebellum bilaterally, worse on the right. 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 Early edema suspected in the thalami right greater than left  12/17 Repeat cerebral angiogram by Neuro IR with clot retrieval. Remained intubated after procedure 12/17 CT head: Large RIGHT MCA territory infarct extending to RIGHT basal ganglia. Underlying atrophy and small vessel chronic ischemic changes. Stable parafalcine subdural hematoma. No new intracranial abnormalities  12/18 failed SBT due to tachypnea and low Vt. Wheezing noted 12/18 MRI brain: New, acute infarcts in the posterior circulation related to treated basilar thrombosis. Infarcts present in the bilateral cerebellum, upper right brainstem, mesial right temporal lobe, and minimally along the right occipital cortex  SUBJECTIVE:  RASS 0. Failed SBT. + F/C  VITAL SIGNS: Temp:  [98.1 F (36.7 C)-100.6 F (38.1 C)] 99.5 F (37.5 C) (12/18 1212) Pulse Rate:  [90-139] 111 (12/18 1612) Resp:  [17-33] 33 (12/18 1612) BP: (93-152)/(56-88) 124/73 mmHg (12/18 1612) SpO2:  [90 %-100 %] 98 % (12/18 1612) FiO2 (%):  [40 %-50 %] 40 % (12/18 1612) Weight:  [89.812 kg (198 lb)] 89.812 kg (198 lb) (12/18 0100) HEMODYNAMICS:   VENTILATOR SETTINGS: Vent Mode:  [-] PRVC FiO2 (%):  [40 %-50 %] 40 % Set Rate:  [14 bmp] 14 bmp Vt Set:  [400 mL] 400 mL PEEP:  [5 cmH20] 5 cmH20 Pressure Support:  [20 cmH20] 20 cmH20 Plateau Pressure:  [20 cmH20-29 cmH20] 25 cmH20 INTAKE / OUTPUT:  Intake/Output Summary (Last 24 hours) at 02/02/14 1630 Last data filed at 02/02/14 1500  Gross per 24 hour  Intake 2629.2 ml  Output    645 ml  Net 1984.2 ml    PHYSICAL EXAMINATION: General:  RASS 0, + F/C Neuro: Does not open eyes, LUE weakness HEENT: abrasion to L  eye unchanged Cardiovascular:  IRIR, tachy, no M Lungs: scattered wheezes Abdomen:  Obese, large midline scar Musculoskeletal:  intact Skin:  Warm , no lower ext edema  LABS: I have reviewed all of  today's lab results. Relevant abnormalities are discussed in the A/P section  CXR: bibasilar atx/effusions  ASSESSMENT / PLAN:  NEUROLOGIC A:   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 ICU associated discomfort P:   RASS goal: 0 Cont Propofol Cont PRN fentanyl Stroke mgmt per Stroke team   PULMONARY OETT 12/14>>12/15, 12/17 >> A: VDRF   COPD with bronchospasm Suspect mild pulm edema P:   Cont full vent support - settings reviewed and/or adjusted Cont vent bundle Daily SBT if/when meets criteria Cont BDs Add nebulized steroids 12/18 DC NS Consider diuresis if CXR not improved 12/19  CARDIOVASCULAR RUE PICC 12/18 >>  A:  Chronic HTN PAF with RVR Sedation related hypotension P:  SBP goal 120-130 mmHg PRN phenylephrine available Cont amiodarone Cont PRN metoprolol Decision re: timing of full anticoagulation per Stroke team  RENAL A:  No acute issue P:   Monitor BMET intermittently Monitor I/Os Correct electrolytes as indicated Avoid free water  GASTROINTESTINAL A:   No issues P:   SUP: enteral pantoprazole Cont TFs per protocol  HEMATOLOGIC A:   ICU acquired anemia without overt bleeding P:  DVT px: SCDs Monitor CBC intermittently Transfuse per usual ICU guidelines  INFECTIOUS A:  No overt infectious process P:   Monitor temp, WBC count  ENDOCRINE A:   Hyperglycemia P:   Cont SSI    FAMILY  - Updates: daughters updated @ bedside    CCM X 45 mins  Merton Border, MD ; [facility] service Mobile [phone].  After 5:30 PM or weekends, call [phone]

## 2014-02-02 NOTE — Progress Notes (Signed)
Speech Language Pathology Discharge Patient Details Name: Summer Bradley MRN: [id number] DOB: [birth year] Today's Date: 02/02/2014 Time:  -     Patient discharged from SLP services secondary to medical decline - will need to re-order SLP to resume therapy services. Please reconsult once extubated.  Please see latest therapy progress note for current level of functioning and progress toward goals.    Progress and discharge plan discussed with patient and/or caregiver: Patient unable to participate in discharge planning and no caregivers available  GO     Mick Sell, Tami Ribas [place].Ed CCC-SLP Pager [phone]   02/02/2014, 7:55 AM

## 2014-02-02 NOTE — Progress Notes (Signed)
STROKE TEAM PROGRESS NOTE   HISTORY Semya Klinke is a 68 y.o. female who got up around 4:30 AM 01/29/2014 to go to the bathroom and then sat down in her chair. She is not certain if her left arm was working at that time or not. She then fell out of her chair around 5 AM and that time noticed that her left arm wasn't working. She did not realize that anything was wrong until she fell out of her chair.  She was in her normal state of health ongoing to bed the evening prior. She was last known well at 8p on 01/28/2014.  In the ER, a CT was obtained which shows evidence of a previous stroke, and a CT perfusion scan was obtained which shows a significant mismatch demonstrating an area of penumbra, though there is an smaller area of likely infarct core as well.  Patient was not administered TPA secondary to being  outside of the tPA window with question of subdural hematoma on CT. Due to mismatch, she was taken to neuro intervention where she had complete revascularization of RT MCA M1 occlusion using 2 passes with the Solitaire and 10 mg of IA Integrelin. TICI 3 flow reesablished. Afterwards, she was admitted to the neuro ICU for further evaluation and treatment.   SUBJECTIVE (INTERVAL HISTORY) The patient has been able to follow commands. She remains intubated, unable to speak. Will nod her head to questions. Family is not present at bedside.   OBJECTIVE Temp:  [98.1 F (36.7 C)-100.6 F (38.1 C)] 98.1 F (36.7 C) (12/18 0352) Pulse Rate:  [31-164] 107 (12/18 0600) Cardiac Rhythm:  [-] Atrial fibrillation (12/17 2000) Resp:  [12-33] 28 (12/18 0600) BP: (90-154)/(54-99) 124/72 mmHg (12/18 0600) SpO2:  [90 %-100 %] 96 % (12/18 0600) Arterial Line BP: (91-138)/(58-83) 138/80 mmHg (12/17 1400) FiO2 (%):  [40 %-50 %] 40 % (12/18 0600) Weight:  [198 lb (89.812 kg)-198 lb 6.6 oz (90 kg)] 198 lb (89.812 kg) (12/18 0100)  No results for input(s): GLUCAP in the last 168 hours.  Recent Labs Lab  01/29/14 0609 01/29/14 0616 01/30/14 0337 02/02/14 0245  NA 140 139 140 145  K 4.2 3.9 4.2 3.7  CL 100 101 105 109  CO2 24  --  24 25  GLUCOSE 128* 131* 101* 92  BUN 15 17 11 13   CREATININE 0.76 0.80 0.72 0.61  CALCIUM 9.2  --  7.8* 7.9*  MG  --   --  1.6  --   PHOS  --   --  2.2*  --     Recent Labs Lab 01/29/14 0609  AST 19  ALT 17  ALKPHOS 61  BILITOT 1.7*  PROT 7.0  ALBUMIN 3.2*    Recent Labs Lab 01/29/14 0609 01/29/14 0616 01/30/14 0337 02/01/14 1039 02/02/14 0245  WBC 13.9*  --  13.0* 11.9* 12.3*  NEUTROABS 10.7*  --  10.5* 9.7* 10.1*  HGB 11.8* 14.6 9.9* 8.1* 8.4*  HCT 39.0 43.0 32.6* 27.4* 29.0*  MCV 82.6  --  84.5 83.3 83.1  PLT 195  --  201 234 228   No results for input(s): CKTOTAL, CKMB, CKMBINDEX, TROPONINI in the last 168 hours. No results for input(s): LABPROT, INR in the last 72 hours. No results for input(s): COLORURINE, LABSPEC, [place], GLUCOSEU, HGBUR, BILIRUBINUR, KETONESUR, PROTEINUR, UROBILINOGEN, NITRITE, LEUKOCYTESUR in the last 72 hours.  Invalid input(s): APPERANCEUR     Component Value Date/Time   CHOL 114 01/30/2014 0337   TRIG 102  01/30/2014 0337   HDL 24* 01/30/2014 0337   CHOLHDL 4.8 01/30/2014 0337   VLDL 20 01/30/2014 0337   LDLCALC 70 01/30/2014 0337   Lab Results  Component Value Date   HGBA1C 6.4* 01/30/2014      Component Value Date/Time   LABOPIA NONE DETECTED 01/29/2014 0659   COCAINSCRNUR NONE DETECTED 01/29/2014 0659   LABBENZ NONE DETECTED 01/29/2014 0659   AMPHETMU NONE DETECTED 01/29/2014 0659   THCU NONE DETECTED 01/29/2014 0659   LABBARB NONE DETECTED 01/29/2014 0659     Recent Labs Lab 01/29/14 0609  ETH <11    Ct Head Wo Contrast  01/30/2014    1. Resolved right MCA territory hyperdensity present at 1056 hrs yesterday, most likely was post Neurointervention parenchymal contrast staining. 2. Mild cytotoxic edema suspected in that same right MCA territory. Underlying chronic ischemic changes.  3. Stable parafalcine subdural hematoma. No new intracranial hemorrhage or acute mass effect. 4. No new intracranial abnormality.    01/29/2014    Acute infarct right MCA territory as noted on CT perfusion study. There is interval development of high density in the right temporoparietal cortex which may be due to contrast material or hemorrhage. Continued follow-up recommended.  Small interhemispheric subdural hematoma is unchanged.    01/29/2014   1. 4 mm thick posterior falx subdural hemorrhage. 2. Prominent density at the right M1/M2 junction. CTA is already planned. 3. Established infarcts in the anterior and posterior right MCA territory. No definitive acute infarct. 4. Left frontal scalp contusion.  No acute fracture.    02/01/14: Stat CT scan shows no acute changes in the brain, but there appears to be a thrombus in the basilar artery that is new.   Ct Cerebral Perfusion W/cm 01/29/2014    1. Occlusive thrombus at the right M1-M2 junction. Subjectively good pial/pial collateral circulation. 2. Perfusion findings suggest moderate acute infarct in the central right MCA territory with rim of penumbra, as above. 3. Remote infarcts at the anterior and posterior margins of the right MCA territory. 4. Thin posterior falx subdural hemorrhage.     Cerebral Angiogram 01/29/2014 S/P Bilateral common carotid arteriograms ,followed by complete revascularization of RT MCA M1 occlusion usingX2 passes with the Solitaire FR 4mm x 94mm retrieval device And 10 mg of  superselective intracranial IA Integrelin. TICI 3 flow reesablished.   MRI brain 01/31/14: IMPRESSION: 1. Acute nonhemorrhagic infarct involving the superior right temporal lobe, right insular ribbon, right frontal operculum, and posterior right frontal lobe. 2. More remote infarcts are again noted in the more anterior inferior right frontal lobe and the right parietal lobe. 3. Extensive white matter changes bilaterally likely reflect  the sequela of chronic microvascular ischemia, advanced for age. 4. White matter changes extend into the brainstem. 5. Remote tender infarcts of the cerebellum bilaterally, worse on the right.  MRI brain 02/02/14: IMPRESSION: 1. New, acute infarcts in the posterior circulation related to treated basilar thrombosis. Infarcts present in the bilateral cerebellum, upper right brainstem, mesial right temporal lobe, and minimally along the right occipital cortex. Normal flow voids within the intracranial vessels. 2. Stable appearance of recent right MCA territory infarct. 3. Stable trace falcine subdural hemorrhage.  Dg Chest Port 1 View 01/30/2014    COPD with bibasilar scarring with superimposed subsegmental atelectasis. There is no evidence of CHF.    01/29/2014    Bilateral perihilar/infrahilar opacification likely interstitial edema and less likely infection.  Endotracheal tube with tip 5.2 cm above the carina.  2D Echocardiogram  EF 50-55% with no source of embolus.    Physical Exam  General: The patient is awake, following commands. The patient is moderately obese. The patient has ecchymosis over the left brow.  Respiratory: Lung fields are clear  Cardiovascular: Occasional irregular heart rhythm, no murmurs noted  Abdomen: Soft  Skin: No significant peripheral edema is noted.   Neurologic Exam  Mental status: The patient is cooperative, unable to open her eyes.  Cranial nerves: Facial symmetry is not present. Patient has some depression of the left nasolabial fold. Pupils are equal, trace reactive, equal. The patient cannot generate any horizontal or vertical eye movements. She is unable to open her eyes spontaneously. There is no blink to threat from either side.  Motor: The patient is able to move both legs fairly well, the left arm appears to be plegic. The patient has good control of the right arm.  Sensory examination: The patient has decreased sensation on the  left arm, seems to have symmetric sensation on the legs.  Coordination: No obvious ataxia seen on the extremities. Difficult to test.  Gait and station: Gait was not tested.  Reflexes: Deep tendon reflexes are symmetric.    ASSESSMENT/PLAN Ms. Jariyah Hackley is a 68 y.o. female with history of hypertension, stroke, afib and COPD presenting with Left-sided weakness. She did not receive IV t-PA due to delay in arrival, ? SDH.   Stroke:  Suspect right MCA infarct status post complete vascularization of right MCA M1 occlusion with IA Integrilin and mechanical thrombectomy, workup underway.   2D Echo  No source of embolus  HgbA1c 6.4  SCDs for VTE prophylaxis  Diet NPO time specified   aspirin 81 mg orally every day and warfarin prior to admission, added aspirin PR or PO. Anticoagulation was considered prior to discharge  Ongoing aggressive stroke risk factor management  Therapy recommendations:    Acute Respiratory Failure  Intubated for neuro intervention  Critical care managed ventilator  Extubated 01/30/2014  Atrial Fibrillation  Home meds:  Warfarin  INR subtherapeutic on admission, INR 1.18    placed on amiodarone for rate control in setting of hypertension   Hypertension  Systolic blood pressure less than 100 post procedure. Given IV fluid bolus. Hypotension resolved  Continue to monitor  Hyperlipidemia  Home meds:  Mevacor  LDL 70, goal < 70  Reason Mevacor once able to swallow  Other Stroke Risk Factors  Advanced age  Obesity, Body mass index is 36.28 kg/(m^2).   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 Dr. Estanislado Pandy was able to extract the clot, and the patient has sustained a right pontine and midbrain stroke, small bilateral cerebellar strokes, and some extension of the infarct in the mesial  temporal area on the right. The patient has increased weakness on the left arm, and she now has eye opening apraxia, and inability to move her eyes in any plane. The patient is fully conscious, following commands readily. There is a small subdural in the parafalcine area by MRI. The patient will remain on aspirin at this time, and subcutaneous Lovenox. Anticoagulation will be initiated sometime in the next several days. Dr. Erlinda Hong is aware of this case, he will be seeing the patient tomorrow.   Lenor Coffin [phone]   To contact Stroke Continuity provider, please refer to [email or web address] After hours,  contact General Neurology

## 2014-02-02 NOTE — Progress Notes (Signed)
RN called me to bedside d/t pt w/ increased RR 35-40.  RN gave sedation bolus, pt still w/ RR 35.  Changed vent back to full vent support.  Pt tol well, RN aware.

## 2014-02-02 NOTE — Progress Notes (Signed)
Dear Doctor: This patient has been identified as a candidate for PICC for the following reason (s): drug pH or osmolality (causing phlebitis, infiltration in 24 hours) and poor veins/poor circulatory system (CHF, COPD, emphysema, diabetes, steroid use, IV drug abuse, etc.) If you agree, please write an order for the indicated device. For any questions contact the Vascular Access Team at [phone] if no answer, please leave a message.  Thank you for supporting the early vascular access assessment program. Catalina Pizza

## 2014-02-02 NOTE — Progress Notes (Signed)
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 Authorizing Provider  albuterol (PROVENTIL HFA;VENTOLIN HFA) 108 (90 BASE) MCG/ACT inhaler Inhale 1 puff into the lungs every 6 (six) hours as needed for wheezing or shortness of breath.   Yes Historical Provider, MD  aspirin EC 81 MG tablet Take 81 mg by mouth daily.   Yes Historical Provider, MD  budesonide-formoterol (SYMBICORT) 160-4.5 MCG/ACT inhaler Inhale 2 puffs into the lungs 2 (two) times daily.   Yes Historical Provider, MD  CALCIUM-VITAMIN D PO Take 1 tablet by mouth daily.   Yes Historical Provider, MD  citalopram (CELEXA) 20 MG tablet Take 20 mg by mouth at bedtime.  01/22/14  Yes Historical Provider, MD  clonazePAM (KLONOPIN) 0.5 MG tablet Take 0.5 mg by mouth daily as needed for anxiety.  12/20/13  Yes Historical Provider, MD  fluticasone (FLONASE) 50 MCG/ACT nasal spray Place 1 spray into both nostrils daily as needed for allergies.  12/06/13  Yes Historical Provider, MD  furosemide (LASIX) 20 MG tablet Take 20 mg by mouth daily.  11/06/13  Yes Historical Provider, MD  ipratropium (ATROVENT HFA) 17 MCG/ACT inhaler Inhale 2 puffs into the lungs 4 (four) times daily.   Yes Historical Provider, MD  levothyroxine (SYNTHROID, LEVOTHROID) 150 MCG tablet Take 150 mcg by mouth daily before breakfast.  01/09/14  Yes Historical Provider, MD  lovastatin (MEVACOR) 10 MG tablet Take 10 mg by mouth daily.  11/06/13  Yes Historical Provider, MD  metoprolol (LOPRESSOR) 100 MG tablet Take 100 mg by mouth 2 (two) times daily.  01/15/14  Yes Historical Provider, MD  Multiple Vitamin (MULTIVITAMIN WITH MINERALS) TABS tablet Take 1 tablet by mouth daily.   Yes Historical  Provider, MD  omeprazole (PRILOSEC) 20 MG capsule Take 20 mg by mouth daily.  11/06/13  Yes Historical Provider, MD  verapamil (CALAN-SR) 180 MG CR tablet Take 180 mg by mouth daily.  01/15/14  Yes Historical Provider, MD  warfarin (COUMADIN) 1 MG tablet Take 2.5 mg by mouth daily.  01/15/14  Yes Historical Provider, MD    Review of Systems  Vital Signs: BP 131/82 mmHg  Pulse 113  Temp(Src) 99.5 F (37.5 C) (Oral)  Resp 32  Ht 5\' 2"  (1.575 m)  Wt 90 kg (198 lb 6.6 oz)  BMI 36.28 kg/m2  SpO2 93%  Physical Exam  Pulmonary/Chest:  On vent  Abdominal:  Rt groin NT; no bleeding  no hematoma  Musculoskeletal:  Moving to command B feet and legs Rt hand/arm to command No movement on Left hand/arm  Skin: Skin is warm.    Imaging: Ct Head Wo Contrast  02/01/2014   CLINICAL DATA:  Stroke, postprocedure followup  EXAM: CT HEAD WITHOUT CONTRAST  TECHNIQUE: Contiguous axial images were obtained from the base of the skull through the vertex without intravenous contrast.  COMPARISON:  02/01/2014  FINDINGS: Generalized atrophy.  Stable ventricular morphology.  Large area of low attenuation involving the RIGHT temporal and parietal lobes compatible with a RIGHT MCA territory infarct.  Underlying small vessel chronic ischemic changes of deep cerebral white matter.  Extension of RIGHT MCA infarct into RIGHT basal ganglia.  High attenuation at the falx from parafalcine subdural hematoma unchanged since 01/30/2014.  No new areas of  hemorrhage or infarction identified.  Atherosclerotic calcifications at skullbase.  Hyper density seen at the basilar tip on the previous exam is not identified on current study.  IMPRESSION: Large RIGHT MCA territory infarct extending to RIGHT basal ganglia.  Underlying atrophy and small vessel chronic ischemic changes.  Stable parafalcine subdural hematoma.  No new intracranial abnormalities.   Electronically Signed   By: Lavonia Dana M.D.   On: 02/01/2014 12:42   Ct Head  Wo Contrast  02/01/2014   CLINICAL DATA:  68 year old female with worsening mental status since 0640 hrs. 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 Initial encounter.  EXAM: CT HEAD WITHOUT CONTRAST  TECHNIQUE: Contiguous axial images were obtained from the base of the skull through the vertex without intravenous contrast.  COMPARISON:  Brain MRI 01/31/2014 and earlier.  FINDINGS: Stable visualized osseous structures. Stable paranasal sinuses and mastoids. Stable orbit and scalp soft tissues. Calcified atherosclerosis at the skull base.  Evolved cytotoxic edema a in the right MCA territory since 01/30/2014, corresponding to the restricted diffusion identified yesterday. No midline shift or significant mass effect at this time. No malignant hemorrhagic transformation.  Small volume parafalcine subdural hematoma has not significantly changed. No ventriculomegaly.  New abnormal hyperdensity at the tip of the basilar artery seen on series 2, images 13 and 14. Suspect the left P1 segment might also be affected. There is subtle new hypodensity in the right thalamus associated with this finding (series 2, image 15). Questionable Mild hypodensity in the left thalamus (same image).  Stable gray-white matter differentiation elsewhere. No other suspicious intracranial vascular hyperdensity. No ventriculomegaly. No new intracranial hemorrhage.  IMPRESSION: 1. 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 Early edema suspected in the thalami right greater than left. 2. No acute intracranial hemorrhage. Stable small parafalcine subdural hematoma. 3. Expected evolution of the acute right MCA infarct, with underlying chronic right MCA and right cerebellar infarcts. Critical Value/emergent results were called by telephone at the time of interpretation on 02/01/2014 at 0758 hrs to Dr. Alexis Goodell , who verbally acknowledged these results.   Electronically Signed   By:  Lars Pinks M.D.   On: 02/01/2014 08:01   Ct Head Wo Contrast  01/30/2014   CLINICAL DATA:  68 year old female status post right MCA infarct and intra-arterial intervention. Initial encounter.  EXAM: CT HEAD WITHOUT CONTRAST  TECHNIQUE: Contiguous axial images were obtained from the base of the skull through the vertex without intravenous contrast.  COMPARISON:  01/29/2014 and earlier.  FINDINGS: Intubated. Small volume fluid in the paranasal sinuses. Mild right mastoid effusion. No acute osseous abnormality identified. Left face and scalp broad-based soft tissue hematoma. Visualized orbit soft tissues are within normal limits.  Calcified atherosclerosis at the skull base. Interval regression of patchy hyperdensity in the posterior right MCA territory. Mild parenchymal hypodensity in the same area compared to the presentation study at 0614 hr on 01/29/2014. No regional mass effect. Superimposed chronic appearing anterior and posterior MCA division infarcts.  Stable parafalcine small volume subdural hematoma. No new intracranial hemorrhage. No ventriculomegaly. No acute midline shift or intracranial mass effect. Stable small chronic infarct in the inferior right cerebellum. Stable cerebral white matter hypodensity elsewhere. Decreased conspicuity of hyperdensity at the right MCA bifurcation. Stable intracranial vascular hyperdensity elsewhere.  IMPRESSION: 1. Resolved right MCA territory hyperdensity present at 1056 hrs yesterday, most likely was post Neurointervention parenchymal contrast staining. 2. Mild cytotoxic edema suspected in that same right MCA territory. Underlying chronic ischemic  changes. 3. Stable parafalcine subdural hematoma. No new intracranial hemorrhage or acute mass effect. 4. No new intracranial abnormality.   Electronically Signed   By: Lars Pinks M.D.   On: 01/30/2014 07:34   Mr Brain Wo Contrast  02/02/2014   CLINICAL DATA:  Stroke.  EXAM: MRI HEAD WITHOUT CONTRAST  TECHNIQUE:  Multiplanar, multiecho pulse sequences of the brain and surrounding structures were obtained without intravenous contrast.  COMPARISON:  Brain MRI from 2 days prior  FINDINGS: Calvarium and upper cervical spine: No significant marrow signal abnormality.  Orbits: No significant findings.  Sinuses: Clear. Mastoid and middle ears are clear.  Brain: New multi focal infarcts in the posterior circulation, affecting the mesial right temporal lobe (hippocampus included), right occipital cortex (small volume), bilateral mid and upper cerebellum (individual infarcts measure 15 mm or less), and right upper pons and midbrain. In this patient with recent basilar thrombectomy, flow void within the vertebral arteries, basilar artery, and proximal PCAs is preserved. Anterior circulation flow voids are also normal.  A recent infarct in the central right MCA territory shows no interval enlargement or hemorrhage. In fact, gradient blooming is decreasing. Remote infarcts with dense gliosis in the anterior and posterior margins of the right MCA territory are again noted, with cortical laminar necrosis. A small posterior falcine subdural hematoma is unchanged. No new intracranial hemorrhage identified.  IMPRESSION: 1. New, acute infarcts in the posterior circulation related to treated basilar thrombosis. Infarcts present in the bilateral cerebellum, upper right brainstem, mesial right temporal lobe, and minimally along the right occipital cortex. Normal flow voids within the intracranial vessels. 2. Stable appearance of recent right MCA territory infarct. 3. Stable trace falcine subdural hemorrhage.   Electronically Signed   By: Jorje Guild M.D.   On: 02/02/2014 04:38   Mr Brain Wo Contrast  01/31/2014   CLINICAL DATA:  Right MCA territory infarcts. Status post revascularization of the right middle cerebral artery  EXAM: MRI HEAD WITHOUT CONTRAST  TECHNIQUE: Multiplanar, multiecho pulse sequences of the brain and surrounding  structures were obtained without intravenous contrast.  COMPARISON:  CT head 01/30/2014. CT perfusion and CT angiogram 01/29/2014.  FINDINGS: The diffusion-weighted images demonstrate an acute infarct involving the posterior right MCA territory. The superior right posterior temporal lobe is involved. The insular ribbon is involved. The right frontal operculum is involved with additional involvement of the right frontal lobe superiorly. Anterior frontal lobe is spared. The basal ganglia are spared.  T2 changes are associated with the areas of acute infarct. More remote infarcts are noted anteriorly in the right frontal lobe and in the right parietal lobe. There is no acute hemorrhage. There is sulcal effacement without midline shift. Extensive periventricular white matter changes are noted bilaterally. Remote lacunar infarcts are present in the cerebellum bilaterally, worse on the right. The white matter changes extend into the brainstem.  Flow is present in the major intracranial arteries. The globes and orbits are intact. The paranasal sinuses are clear. There is some fluid in the mastoid air cells bilaterally. No obstructing nasopharyngeal lesion is evident.  IMPRESSION: 1. Acute nonhemorrhagic infarct involving the superior right temporal lobe, right insular ribbon, right frontal operculum, and posterior right frontal lobe. 2. More remote infarcts are again noted in the more anterior inferior right frontal lobe and the right parietal lobe. 3. Extensive white matter changes bilaterally likely reflect the sequela of chronic microvascular ischemia, advanced for age. 4. White matter changes extend into the brainstem. 5. Remote tender infarcts of  the cerebellum bilaterally, worse on the right.   Electronically Signed   By: Lawrence Santiago M.D.   On: 01/31/2014 18:35   Dg Chest Port 1 View  02/02/2014   CLINICAL DATA:  PICC placement.  EXAM: PORTABLE CHEST - 1 VIEW  COMPARISON:  Earlier the same day 05:39  FINDINGS:  1052 hr Tip of the right upper extremity PICC in the distal SVC. No pneumothorax. Endotracheal tube at the thoracic inlet. Enteric tube in place. Increasing bibasilar opacity and pleural effusions. Cardiomegaly and vascular congestion are unchanged.  IMPRESSION: Tip of the right upper extremity PICC in the distal SVC. No pneumothorax.   Electronically Signed   By: Jeb Levering M.D.   On: 02/02/2014 11:14   Dg Chest Port 1 View  02/02/2014   CLINICAL DATA:  Respiratory failure  EXAM: PORTABLE CHEST - 1 VIEW  COMPARISON:  02/01/2014  FINDINGS: Cardiac shadow remains enlarged. A nasogastric catheter and endotracheal tube are again seen and stable. Diffuse interstitial changes are noted bilaterally but stable in appearance from the prior exam consistent with mild congestive failure. No focal infiltrate is seen. No sizable effusion is noted.  IMPRESSION: No significant change from the prior exam. Mild vascular congestion remains.   Electronically Signed   By: Inez Catalina M.D.   On: 02/02/2014 07:53   Dg Chest Port 1 View  02/01/2014   CLINICAL DATA:  Hypoxia  EXAM: PORTABLE CHEST - 1 VIEW  COMPARISON:  January 30, 2014  FINDINGS: Endotracheal tube tip is 3.3 cm above the carina. Nasogastric tube tip and side port are below the diaphragm. No pneumothorax. There is and interstitial edema and cardiomegaly consistent with a degree of congestive heart failure. There is atelectatic change in left base. There is no airspace consolidation. Pulmonary vascularity is within normal limits. Note that there appears to be bullous disease in the right upper lobe.  IMPRESSION: Tube positions as described without pneumothorax. Suspect bullous disease in the right upper lobe. Evidence of a degree of congestive heart failure.   Electronically Signed   By: Lowella Grip M.D.   On: 02/01/2014 13:19   Dg Chest Port 1 View  01/30/2014   CLINICAL DATA:  Respiratory failure; history of COPD and atrial fibrillation and  previous CVA ; past year history of tobacco use  EXAM: PORTABLE CHEST - 1 VIEW  COMPARISON:  Portable chest x-ray of January 29, 2014  FINDINGS: The endotracheal tube tip lies 5.2 cm above the crotch of the carina. The lungs are well-expanded. There is bibasilar subsegmental atelectasis which is more conspicuous today. The underline basilar interstitial markings remain increased. The hemidiaphragms are partially obscured today. There is no pneumothorax nor large pleural effusion. The cardiac silhouette is mildly enlarged though stable. The pulmonary vascularity is not engorged. The bony thorax exhibits no acute abnormalities.  IMPRESSION: COPD with bibasilar scarring with superimposed subsegmental atelectasis. There is no evidence of CHF.   Electronically Signed   By: David  Martinique   On: 01/30/2014 08:10    Labs:  CBC:  Recent Labs  01/29/14 0609 01/29/14 0616 01/30/14 0337 02/01/14 1039 02/02/14 0245  WBC 13.9*  --  13.0* 11.9* 12.3*  HGB 11.8* 14.6 9.9* 8.1* 8.4*  HCT 39.0 43.0 32.6* 27.4* 29.0*  PLT 195  --  201 234 228    COAGS:  Recent Labs  01/29/14 0609 01/30/14 0337  INR 1.18 1.30  APTT 33 38*    BMP:  Recent Labs  01/29/14 0609 01/29/14 2831  01/30/14 0337 02/02/14 0245  NA 140 139 140 145  K 4.2 3.9 4.2 3.7  CL 100 101 105 109  CO2 24  --  24 25  GLUCOSE 128* 131* 101* 92  BUN 15 17 11 13   CALCIUM 9.2  --  7.8* 7.9*  CREATININE 0.76 0.80 0.72 0.61  GFRNONAA 84*  --  86* >90  GFRAA >90  --  >90 >90    LIVER FUNCTION TESTS:  Recent Labs  01/29/14 0609  BILITOT 1.7*  AST 19  ALT 17  ALKPHOS 61  PROT 7.0  ALBUMIN 3.2*    Assessment and Plan:  CVA 12/14 with R MCA revascularization New CVA 12/17: revasc basilar artery On vent but does follow most commands   I spent a total of 15 minutes face to face in clinical consultation/evaluation, greater than 50% of which was counseling/coordinating care for CVA x2  Signed: TURPIN,PAMELA  A 02/02/2014, 1:16 PM

## 2014-02-03 ENCOUNTER — Inpatient Hospital Stay (HOSPITAL_COMMUNITY): Payer: Medicare HMO

## 2014-02-03 LAB — GLUCOSE, CAPILLARY
Glucose-Capillary: 111 mg/dL — ABNORMAL HIGH (ref 70–99)
Glucose-Capillary: 120 mg/dL — ABNORMAL HIGH (ref 70–99)
Glucose-Capillary: 96 mg/dL (ref 70–99)

## 2014-02-03 LAB — BASIC METABOLIC PANEL
Anion gap: 9 (ref 5–15)
BUN: 18 mg/dL (ref 6–23)
CO2: 26 mEq/L (ref 19–32)
Calcium: 8.1 mg/dL — ABNORMAL LOW (ref 8.4–10.5)
Chloride: 108 mEq/L (ref 96–112)
Creatinine, Ser: 0.57 mg/dL (ref 0.50–1.10)
GFR calc Af Amer: >90 mL/min (ref >90)
GFR calc non Af Amer: >90 mL/min (ref >90)
Glucose, Bld: 128 mg/dL — ABNORMAL HIGH (ref 70–99)
Potassium: 3.6 mEq/L — ABNORMAL LOW (ref 3.7–5.3)
Sodium: 143 mEq/L (ref 137–147)

## 2014-02-03 LAB — CBC
HCT: 27.6 % — ABNORMAL LOW (ref 36.0–46.0)
Hemoglobin: 8.3 g/dL — ABNORMAL LOW (ref 12.0–15.0)
MCH: 24.9 pg — ABNORMAL LOW (ref 26.0–34.0)
MCHC: 30.1 g/dL (ref 30.0–36.0)
MCV: 82.6 fL (ref 78.0–100.0)
Platelets: 280 10*3/uL (ref 150–400)
RBC: 3.34 MIL/uL — ABNORMAL LOW (ref 3.87–5.11)
RDW: 19.9 % — ABNORMAL HIGH (ref 11.5–15.5)
WBC: 10.6 10*3/uL — ABNORMAL HIGH (ref 4.0–10.5)

## 2014-02-03 MED ORDER — SENNOSIDES 8.8 MG/5ML PO SYRP
5.0000 mL | ORAL_SOLUTION | Freq: Two times a day (BID) | ORAL | Status: DC | PRN
  Filled 2014-02-03: qty 5

## 2014-02-03 MED ORDER — FENTANYL CITRATE 0.05 MG/ML IJ SOLN
50.0000 ug | INTRAMUSCULAR | Status: DC | PRN
  Administered 2014-02-03 – 2014-02-06 (×10): 50 ug via INTRAVENOUS
  Filled 2014-02-03 (×6): qty 2

## 2014-02-03 MED ORDER — DILTIAZEM 12 MG/ML ORAL SUSPENSION
60.0000 mg | Freq: Four times a day (QID) | ORAL | Status: DC
  Administered 2014-02-03 – 2014-02-06 (×12): 60 mg via ORAL
  Filled 2014-02-03 (×20): qty 6

## 2014-02-03 MED ORDER — FENTANYL CITRATE 0.05 MG/ML IJ SOLN
50.0000 ug | INTRAMUSCULAR | Status: DC | PRN
  Filled 2014-02-03 (×4): qty 2

## 2014-02-03 MED ORDER — POTASSIUM CHLORIDE 10 MEQ/50ML IV SOLN
10.0000 meq | INTRAVENOUS | Status: AC
  Administered 2014-02-03 (×2): 10 meq via INTRAVENOUS
  Filled 2014-02-03 (×2): qty 50

## 2014-02-03 MED ORDER — SENNOSIDES 8.8 MG/5ML PO SYRP
5.0000 mL | ORAL_SOLUTION | Freq: Two times a day (BID) | ORAL | Status: DC | PRN
Start: 2014-02-03 — End: 2014-02-06
  Administered 2014-02-03 – 2014-02-05 (×2): 5 mL
  Filled 2014-02-03 (×3): qty 5

## 2014-02-03 MED ORDER — FUROSEMIDE 10 MG/ML IJ SOLN
40.0000 mg | Freq: Once | INTRAMUSCULAR | Status: AC
  Administered 2014-02-03: 40 mg via INTRAVENOUS
  Filled 2014-02-03: qty 4

## 2014-02-03 NOTE — Progress Notes (Signed)
STROKE TEAM PROGRESS NOTE   HISTORY Summer Bradley is a 68 y.o. female who got up around 4:30 AM 01/29/2014 to go to the bathroom and then sat down in her chair. She is not certain if her left arm was working at that time or not. She then fell out of her chair around 5 AM and that time noticed that her left arm wasn't working. She did not realize that anything was wrong until she fell out of her chair.  She was in her normal state of health ongoing to bed the evening prior. She was last known well at 8p on 01/28/2014.  In the ER, a CT was obtained which shows evidence of a previous stroke, and a CT perfusion scan was obtained which shows a significant mismatch demonstrating an area of penumbra, though there is an smaller area of likely infarct core as well.  Patient was not administered TPA secondary to being  outside of the tPA window with question of subdural hematoma on CT. Due to mismatch, she was taken to neuro intervention where she had complete revascularization of RT MCA M1 occlusion using 2 passes with the Solitaire and 10 mg of IA Integrelin. TICI 3 flow reesablished. Afterwards, she was admitted to the neuro ICU for further evaluation and treatment.   SUBJECTIVE (INTERVAL HISTORY) The patient has been able to follow commands. She remains intubated, unable to speak.  Failed extubation trial.    OBJECTIVE Temp:  [97.9 F (36.6 C)-99.7 F (37.6 C)] 97.9 F (36.6 C) (12/19 0800) Pulse Rate:  [33-141] 69 (12/19 0900) Cardiac Rhythm:  [-] Atrial fibrillation (12/19 0800) Resp:  [22-33] 24 (12/19 0900) BP: (113-150)/(62-92) 150/92 mmHg (12/19 0900) SpO2:  [93 %-100 %] 97 % (12/19 0900) FiO2 (%):  [40 %] 40 % (12/19 0800)   Recent Labs Lab 02/02/14 1737 02/02/14 2137 02/03/14 0813  GLUCAP 76 88 120*    Recent Labs Lab 01/29/14 0609 01/29/14 0616 01/30/14 0337 02/02/14 0245 02/03/14 0540  NA 140 139 140 145 143  K 4.2 3.9 4.2 3.7 3.6*  CL 100 101 105 109 108  CO2 24  --   24 25 26   GLUCOSE 128* 131* 101* 92 128*  BUN 15 17 11 13 18   CREATININE 0.76 0.80 0.72 0.61 0.57  CALCIUM 9.2  --  7.8* 7.9* 8.1*  MG  --   --  1.6  --   --   PHOS  --   --  2.2*  --   --     Recent Labs Lab 01/29/14 0609  AST 19  ALT 17  ALKPHOS 61  BILITOT 1.7*  PROT 7.0  ALBUMIN 3.2*    Recent Labs Lab 01/29/14 0609 01/29/14 0616 01/30/14 0337 02/01/14 1039 02/02/14 0245 02/03/14 0540  WBC 13.9*  --  13.0* 11.9* 12.3* 10.6*  NEUTROABS 10.7*  --  10.5* 9.7* 10.1*  --   HGB 11.8* 14.6 9.9* 8.1* 8.4* 8.3*  HCT 39.0 43.0 32.6* 27.4* 29.0* 27.6*  MCV 82.6  --  84.5 83.3 83.1 82.6  PLT 195  --  201 234 228 280   No results for input(s): CKTOTAL, CKMB, CKMBINDEX, TROPONINI in the last 168 hours. No results for input(s): LABPROT, INR in the last 72 hours. No results for input(s): COLORURINE, LABSPEC, [place], GLUCOSEU, HGBUR, BILIRUBINUR, KETONESUR, PROTEINUR, UROBILINOGEN, NITRITE, LEUKOCYTESUR in the last 72 hours.  Invalid input(s): APPERANCEUR     Component Value Date/Time   CHOL 114 01/30/2014 0337   TRIG 102  01/30/2014 0337   HDL 24* 01/30/2014 0337   CHOLHDL 4.8 01/30/2014 0337   VLDL 20 01/30/2014 0337   LDLCALC 70 01/30/2014 0337   Lab Results  Component Value Date   HGBA1C 6.4* 01/30/2014      Component Value Date/Time   LABOPIA NONE DETECTED 01/29/2014 0659   COCAINSCRNUR NONE DETECTED 01/29/2014 0659   LABBENZ NONE DETECTED 01/29/2014 0659   AMPHETMU NONE DETECTED 01/29/2014 0659   THCU NONE DETECTED 01/29/2014 0659   LABBARB NONE DETECTED 01/29/2014 0659     Recent Labs Lab 01/29/14 0609  ETH <11    Ct Head Wo Contrast  01/30/2014    1. Resolved right MCA territory hyperdensity present at 1056 hrs yesterday, most likely was post Neurointervention parenchymal contrast staining. 2. Mild cytotoxic edema suspected in that same right MCA territory. Underlying chronic ischemic changes. 3. Stable parafalcine subdural hematoma. No new  intracranial hemorrhage or acute mass effect. 4. No new intracranial abnormality.    01/29/2014    Acute infarct right MCA territory as noted on CT perfusion study. There is interval development of high density in the right temporoparietal cortex which may be due to contrast material or hemorrhage. Continued follow-up recommended.  Small interhemispheric subdural hematoma is unchanged.    01/29/2014   1. 4 mm thick posterior falx subdural hemorrhage. 2. Prominent density at the right M1/M2 junction. CTA is already planned. 3. Established infarcts in the anterior and posterior right MCA territory. No definitive acute infarct. 4. Left frontal scalp contusion.  No acute fracture.    02/01/14: Stat CT scan shows no acute changes in the brain, but there appears to be a thrombus in the basilar artery that is new.   Ct Cerebral Perfusion W/cm 01/29/2014    1. Occlusive thrombus at the right M1-M2 junction. Subjectively good pial/pial collateral circulation. 2. Perfusion findings suggest moderate acute infarct in the central right MCA territory with rim of penumbra, as above. 3. Remote infarcts at the anterior and posterior margins of the right MCA territory. 4. Thin posterior falx subdural hemorrhage.     Cerebral Angiogram 01/29/2014 S/P Bilateral common carotid arteriograms ,followed by complete revascularization of RT MCA M1 occlusion usingX2 passes with the Solitaire FR 10mm x 29mm retrieval device And 10 mg of  superselective intracranial IA Integrelin. TICI 3 flow reesablished.   MRI brain 01/31/14: IMPRESSION: 1. Acute nonhemorrhagic infarct involving the superior right temporal lobe, right insular ribbon, right frontal operculum, and posterior right frontal lobe. 2. More remote infarcts are again noted in the more anterior inferior right frontal lobe and the right parietal lobe. 3. Extensive white matter changes bilaterally likely reflect the sequela of chronic microvascular ischemia, advanced  for age. 4. White matter changes extend into the brainstem. 5. Remote tender infarcts of the cerebellum bilaterally, worse on the right.  MRI brain 02/02/14: IMPRESSION: 1. New, acute infarcts in the posterior circulation related to treated basilar thrombosis. Infarcts present in the bilateral cerebellum, upper right brainstem, mesial right temporal lobe, and minimally along the right occipital cortex. Normal flow voids within the intracranial vessels. 2. Stable appearance of recent right MCA territory infarct. 3. Stable trace falcine subdural hemorrhage.  Dg Chest Port 1 View 01/30/2014    COPD with bibasilar scarring with superimposed subsegmental atelectasis. There is no evidence of CHF.    01/29/2014    Bilateral perihilar/infrahilar opacification likely interstitial edema and less likely infection.  Endotracheal tube with tip 5.2 cm above the carina.  2D Echocardiogram  EF 50-55% with no source of embolus.    Physical Exam  General: The patient is awake, following commands. The patient is moderately obese. The patient has ecchymosis over the left brow.  Respiratory: Lung fields are clear  Cardiovascular: Occasional irregular heart rhythm, no murmurs noted  Abdomen: Soft  Skin: No significant peripheral edema is noted.   Neurologic Exam  Mental status: The patient is cooperative, unable to open her eyes.  Cranial nerves: Facial symmetry is not present. Patient has some depression of the left nasolabial fold. Pupils are equal, trace reactive, equal. The patient cannot generate any horizontal or vertical eye movements. She is unable to open her eyes spontaneously. There is no blink to threat from either side.  Motor: The patient is able to move both legs fairly well, the left arm appears to be plegic. The patient has good control of the right arm.  Sensory examination: The patient has decreased sensation on the left arm, seems to have symmetric sensation on the  legs.  Coordination: No obvious ataxia seen on the extremities. Difficult to test.  Gait and station: Gait was not tested.  Reflexes: Deep tendon reflexes are symmetric.    ASSESSMENT/PLAN Ms. Loye Reininger is a 68 y.o. female with history of hypertension, stroke, afib and COPD presenting with Left-sided weakness. She did not receive IV t-PA due to delay in arrival, ? SDH.   Stroke:  Suspect right MCA infarct status post complete vascularization of right MCA M1 occlusion with IA Integrilin and mechanical thrombectomy, workup underway.   2D Echo  No source of embolus  HgbA1c 6.4  SCDs for VTE prophylaxis      aspirin 81 mg orally every day and warfarin prior to admission, added aspirin PR or PO.  Will need to Start NOAC  Ongoing aggressive stroke risk factor management  Therapy recommendations:    Acute Respiratory Failure  Intubated for neuro intervention- failed extubation trial.   Critical care managed ventilator   Atrial Fibrillation  Home meds:  Warfarin  INR subtherapeutic on admission, INR 1.18    placed on amiodarone for rate control in setting of hypertension  Will need NOAC probably sometime next week.    Hypertension   Continue to monitor  Hyperlipidemia  Home meds:  Mevacor  LDL 70, goal < 70  Reason Mevacor once able to swallow  Other Stroke Risk Factors  Advanced age  Obesity, Body mass index is 36.28 kg/(m^2).   Hx stroke/TIA  Other Active Problems  COPD  Depression, on chronic SSRI  Hospital day # 5  Leotis Pain    To contact Stroke Continuity provider, please refer to [email or web address] After hours, contact General Neurology

## 2014-02-03 NOTE — Progress Notes (Addendum)
PULMONARY / CRITICAL CARE MEDICINE   Name: Summer Bradley MRN: [id number] DOB: 09-Feb-1946    ADMISSION DATE:  01/29/2014 CONSULTATION DATE:  12/14  REFERRING MD : Neuro  CHIEF COMPLAINT: Left sided weakness  HISTORY OF PRESENT ILLNESS:   8 F with COPD, PAF admitted with acute CVA to Stroke Service after clot retrieval from R MCA  STUDIES/SIGNIFICANT EVENTS: 12/14 CTA head: Occlusive thrombus at the right M1-M2 junction. Subjectively good pial/pial collateral circulation. Perfusion findings suggest moderate acute infarct in the central right MCA territory with rim of penumbra, as above. Remote infarcts at the anterior and posterior margins of the right MCA territory. Thin posterior falx subdural hemorrhage  12/14 admitted to stroke  service after Neuro IR clot retrieval from R MCA. Intubated for procedure. PCCM consultation 12/14 CT head: Acute infarct right MCA territory as noted on CT perfusion study. There is interval development of high density in the right  temporoparietal cortex which may be due to contrast material or hemorrhage.  12/14 Echocardiogram: Lvef 50-55% 12/15 Ct head: Mild cytotoxic edema suspected in that same right MCA territory. Underlying chronic ischemic changes. Stable parafalcine subdural hematoma. No new intracranial hemorrhage or acute mass effect. No new intracranial abnormality 12/15 Extubated 12/16 Transferred out of ICU. PCCM signed off 12/16 MRI brain: Acute nonhemorrhagic infarct involving the superior right temporal lobe, right insular ribbon, right frontal operculum, and posterior right frontal lobe. More remote infarcts are again noted in the more anterior inferior right frontal lobe and the right parietal lobe. Extensive white matter changes bilaterally likely reflect the sequela of chronic microvascular ischemia, advanced for age. White matter changes extend into the brainstem. Remote tender infarcts of the cerebellum bilaterally, worse on the right. 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 Early edema suspected in the thalami right greater than left  12/17 Repeat cerebral angiogram by Neuro IR with clot retrieval. Remained intubated after procedure 12/17 CT head: Large RIGHT MCA territory infarct extending to RIGHT basal ganglia. Underlying atrophy and small vessel chronic ischemic changes. Stable parafalcine subdural hematoma. No new intracranial abnormalities  12/18 failed SBT due to tachypnea and low Vt. Wheezing noted 12/18 MRI brain: New, acute infarcts in the posterior circulation related to treated basilar thrombosis. Infarcts present in the bilateral cerebellum, upper right brainstem, mesial right temporal lobe, and minimally along the right occipital cortex  SUBJECTIVE:  Pt improved, more alert  VITAL SIGNS: Temp:  [97.9 F (36.6 C)-99.7 F (37.6 C)] 97.9 F (36.6 C) (12/19 0800) Pulse Rate:  [33-141] 69 (12/19 0900) Resp:  [22-33] 24 (12/19 0900) BP: (113-150)/(62-92) 150/92 mmHg (12/19 0900) SpO2:  [93 %-100 %] 97 % (12/19 0900) FiO2 (%):  [40 %] 40 % (12/19 0800) HEMODYNAMICS: CV stable    VENTILATOR SETTINGS: Vent Mode:  [-] PRVC FiO2 (%):  [40 %] 40 % Set Rate:  [14 bmp] 14 bmp Vt Set:  [400 mL] 400 mL PEEP:  [5 cmH20] 5 cmH20 Pressure Support:  [20 cmH20] 20 cmH20 Plateau Pressure:  [18 cmH20-25 cmH20] 25 cmH20 INTAKE / OUTPUT:  Intake/Output Summary (Last 24 hours) at 02/03/14 0951 Last data filed at 02/03/14 0900  Gross per 24 hour  Intake 863.23 ml  Output    210 ml  Net 653.23 ml    PHYSICAL EXAMINATION: General:  RASS 0, + F/C Neuro: Does not open eyes, LUE weakness HEENT: abrasion to L eye unchanged Cardiovascular:  IRIR, tachy, no M Lungs: scattered wheezes, moderate secretions  Abdomen:  Obese, large midline scar Musculoskeletal:  intact Skin:  Warm , no lower ext edema  LABS: I have reviewed all of today's lab results. Relevant abnormalities  are discussed in the A/P section  CXR: no real changes   ASSESSMENT / PLAN:  NEUROLOGIC A:   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 ICU associated discomfort  P:   RASS goal: 0 D/c propofol Cont PRN fentanyl Stroke mgmt per Stroke team , case discussed with oncall neurology  PULMONARY OETT 12/14>>12/15, 12/17 >> A: VDRF   COPD with bronchospasm Suspect mild pulm edema P:   Cont full vent support - settings reviewed and/or adjusted Not ready for extubation Cont vent bundle Daily SBT if/when meets criteria Cont BDs Add nebulized steroids 12/18 DC NS Consider diuresis if CXR not improved 12/19  CARDIOVASCULAR RUE PICC 12/18 >>  A:  Chronic HTN PAF with RVR Sedation related hypotension Echo EF 50% P:  SBP goal 120-130 mmHg PRN phenylephrine available Dc  amiodarone Add cardiazem Cont PRN metoprolol Decision re: timing of full anticoagulation per Stroke team>> they recommend starting a DOAC end of coming week One dose lasix  RENAL A:  No acute issue P:   Monitor BMET intermittently Monitor I/Os Correct electrolytes as indicated Avoid free water  GASTROINTESTINAL A:   No issues P:   SUP: enteral pantoprazole Cont TFs per protocol  HEMATOLOGIC A:   ICU acquired anemia without overt bleeding P:  DVT px: SCDs Monitor CBC intermittently Transfuse per usual ICU guidelines  INFECTIOUS A:  No overt infectious process P:   Monitor temp, WBC count  ENDOCRINE A:   Hyperglycemia P:   Cont SSI  FAMILY  - Updates: daughters updated @ bedside    CCM X 35 mins  Mariel Sleet Beeper  [phone]  Cell  [phone]  If no response or cell goes to voicemail, call beeper [phone] 9:53 AM 02/03/2014

## 2014-02-03 NOTE — Progress Notes (Signed)
2 Days Post-Op  Subjective: Pt still intubated; now moving left arm and opening eyes some; FC  Objective: Vital signs in last 24 hours: Temp:  [97.9 F (36.6 C)-99.7 F (37.6 C)] 98.1 F (36.7 C) (12/19 1159) Pulse Rate:  [33-141] 133 (12/19 1148) Resp:  [22-39] 39 (12/19 1148) BP: (113-150)/(62-92) 129/82 mmHg (12/19 1148) SpO2:  [93 %-100 %] 95 % (12/19 1148) FiO2 (%):  [40 %] 40 % (12/19 1148) Last BM Date:  (pta)  Intake/Output from previous day: 12/18 0701 - 12/19 0700 In: 973.2 [I.V.:232.4; NG/GT:540.8] Out: 270 [Urine:270] Intake/Output this shift: Total I/O In: 367.8 [I.V.:17.8; Other:60; NG/GT:240; IV Piggyback:50] Out: 135 [Urine:135]  Intubated, FC, pupils 3 mm/brisk react, LUE/LLE 3/5, moving rt side ok; rt CFA puncture site clean and dry, soft, no hermatoma  Lab Results:   Recent Labs  02/02/14 0245 02/03/14 0540  WBC 12.3* 10.6*  HGB 8.4* 8.3*  HCT 29.0* 27.6*  PLT 228 280   BMET  Recent Labs  02/02/14 0245 02/03/14 0540  NA 145 143  K 3.7 3.6*  CL 109 108  CO2 25 26  GLUCOSE 92 128*  BUN 13 18  CREATININE 0.61 0.57  CALCIUM 7.9* 8.1*   PT/INR No results for input(s): LABPROT, INR in the last 72 hours. ABG  Recent Labs  02/01/14 1220  PHART 7.399  HCO3 25.6*    Studies/Results: Mr Brain Wo Contrast  02/02/2014   CLINICAL DATA:  Stroke.  EXAM: MRI HEAD WITHOUT CONTRAST  TECHNIQUE: Multiplanar, multiecho pulse sequences of the brain and surrounding structures were obtained without intravenous contrast.  COMPARISON:  Brain MRI from 2 days prior  FINDINGS: Calvarium and upper cervical spine: No significant marrow signal abnormality.  Orbits: No significant findings.  Sinuses: Clear. Mastoid and middle ears are clear.  Brain: New multi focal infarcts in the posterior circulation, affecting the mesial right temporal lobe (hippocampus included), right occipital cortex (small volume), bilateral mid and upper cerebellum (individual infarcts  measure 15 mm or less), and right upper pons and midbrain. In this patient with recent basilar thrombectomy, flow void within the vertebral arteries, basilar artery, and proximal PCAs is preserved. Anterior circulation flow voids are also normal.  A recent infarct in the central right MCA territory shows no interval enlargement or hemorrhage. In fact, gradient blooming is decreasing. Remote infarcts with dense gliosis in the anterior and posterior margins of the right MCA territory are again noted, with cortical laminar necrosis. A small posterior falcine subdural hematoma is unchanged. No new intracranial hemorrhage identified.  IMPRESSION: 1. New, acute infarcts in the posterior circulation related to treated basilar thrombosis. Infarcts present in the bilateral cerebellum, upper right brainstem, mesial right temporal lobe, and minimally along the right occipital cortex. Normal flow voids within the intracranial vessels. 2. Stable appearance of recent right MCA territory infarct. 3. Stable trace falcine subdural hemorrhage.   Electronically Signed   By: Jorje Guild M.D.   On: 02/02/2014 04:38   Dg Chest Port 1 View  02/03/2014   CLINICAL DATA:  Respiratory failure  EXAM: PORTABLE CHEST - 1 VIEW  COMPARISON:  February 02, 2014  FINDINGS: Endotracheal tube tip is 3.1 cm above the carina. Central catheter tip is in the superior vena cava. Nasogastric tube tip and side port are below the diaphragm. No pneumothorax. There is slightly less interstitial edema compared to 1 day prior. Cardiomegaly is stable. The pulmonary vascular is normal. There is a small left effusion. Right effusion is no longer  appreciable.  IMPRESSION: Evidence of congestive heart failure with slightly less edema compared to 1 day prior. Right effusion no longer appreciable. No new opacity. Tube and catheter positions as described without pneumothorax.   Electronically Signed   By: Lowella Grip M.D.   On: 02/03/2014 07:29   Dg Chest  Port 1 View  02/02/2014   CLINICAL DATA:  PICC placement.  EXAM: PORTABLE CHEST - 1 VIEW  COMPARISON:  Earlier the same day 05:39  FINDINGS: 1052 hr Tip of the right upper extremity PICC in the distal SVC. No pneumothorax. Endotracheal tube at the thoracic inlet. Enteric tube in place. Increasing bibasilar opacity and pleural effusions. Cardiomegaly and vascular congestion are unchanged.  IMPRESSION: Tip of the right upper extremity PICC in the distal SVC. No pneumothorax.   Electronically Signed   By: Jeb Levering M.D.   On: 02/02/2014 11:14   Dg Chest Port 1 View  02/02/2014   CLINICAL DATA:  Respiratory failure  EXAM: PORTABLE CHEST - 1 VIEW  COMPARISON:  02/01/2014  FINDINGS: Cardiac shadow remains enlarged. A nasogastric catheter and endotracheal tube are again seen and stable. Diffuse interstitial changes are noted bilaterally but stable in appearance from the prior exam consistent with mild congestive failure. No focal infiltrate is seen. No sizable effusion is noted.  IMPRESSION: No significant change from the prior exam. Mild vascular congestion remains.   Electronically Signed   By: Inez Catalina M.D.   On: 02/02/2014 07:53   Dg Chest Port 1 View  02/01/2014   CLINICAL DATA:  Hypoxia  EXAM: PORTABLE CHEST - 1 VIEW  COMPARISON:  January 30, 2014  FINDINGS: Endotracheal tube tip is 3.3 cm above the carina. Nasogastric tube tip and side port are below the diaphragm. No pneumothorax. There is and interstitial edema and cardiomegaly consistent with a degree of congestive heart failure. There is atelectatic change in left base. There is no airspace consolidation. Pulmonary vascularity is within normal limits. Note that there appears to be bullous disease in the right upper lobe.  IMPRESSION: Tube positions as described without pneumothorax. Suspect bullous disease in the right upper lobe. Evidence of a degree of congestive heart failure.   Electronically Signed   By: Lowella Grip M.D.   On:  02/01/2014 13:19   Dg Abd Portable 1v  02/02/2014   CLINICAL DATA:  Feeding tube placement  EXAM: PORTABLE ABDOMEN - 1 VIEW  COMPARISON:  None.  FINDINGS: Feeding tube tip and side port are in the proximal stomach. There is a paucity of gas. No free air. There is stool throughout the colon.  IMPRESSION: Nasogastric tube tip and side port in proximal stomach. Paucity of gas suggests ileus or enteritis. Obstruction possible but less likely. No free air on this supine examination.   Electronically Signed   By: Lowella Grip M.D.   On: 02/02/2014 16:31    Anti-infectives: Anti-infectives    Start     Dose/Rate Route Frequency Ordered Stop   02/01/14 0915  ceFAZolin (ANCEF) IVPB 2 g/50 mL premix     2 g100 mL/hr over 30 Minutes Intravenous  Once 02/01/14 0911 02/01/14 0850   02/01/14 0903  ceFAZolin (ANCEF) 2-3 GM-% IVPB SOLR    Comments:  Novella Rob   : cabinet override      02/01/14 0903 02/01/14 2114   01/29/14 0756  ceFAZolin (ANCEF) 2-3 GM-% IVPB SOLR    Comments:  Reather Converse   : cabinet override      01/29/14  [id number] 01/29/14 0810      Assessment/Plan: S/p right MCA CVA, post circulation infarcts, recent rt MCA clot retrieval/basilar artery revasc; cont current tx as per neurology/CCM  LOS: 5 days    Gracielynn Birkel,D [facility] 02/03/2014

## 2014-02-04 ENCOUNTER — Inpatient Hospital Stay (HOSPITAL_COMMUNITY): Payer: Medicare HMO

## 2014-02-04 DIAGNOSIS — J209 Acute bronchitis, unspecified: Secondary | ICD-10-CM

## 2014-02-04 DIAGNOSIS — J9811 Atelectasis: Secondary | ICD-10-CM

## 2014-02-04 LAB — CBC
HCT: 29.1 % — ABNORMAL LOW (ref 36.0–46.0)
Hemoglobin: 8.6 g/dL — ABNORMAL LOW (ref 12.0–15.0)
MCH: 25.1 pg — ABNORMAL LOW (ref 26.0–34.0)
MCHC: 29.6 g/dL — ABNORMAL LOW (ref 30.0–36.0)
MCV: 84.8 fL (ref 78.0–100.0)
Platelets: 339 10*3/uL (ref 150–400)
RBC: 3.43 MIL/uL — ABNORMAL LOW (ref 3.87–5.11)
RDW: 20 % — ABNORMAL HIGH (ref 11.5–15.5)
WBC: 8.7 10*3/uL (ref 4.0–10.5)

## 2014-02-04 LAB — BASIC METABOLIC PANEL
Anion gap: 9 (ref 5–15)
BUN: 23 mg/dL (ref 6–23)
CO2: 30 mEq/L (ref 19–32)
Calcium: 8.4 mg/dL (ref 8.4–10.5)
Chloride: 106 mEq/L (ref 96–112)
Creatinine, Ser: 0.57 mg/dL (ref 0.50–1.10)
GFR calc Af Amer: >90 mL/min (ref >90)
GFR calc non Af Amer: >90 mL/min (ref >90)
Glucose, Bld: 123 mg/dL — ABNORMAL HIGH (ref 70–99)
Potassium: 3.7 mEq/L (ref 3.7–5.3)
Sodium: 145 mEq/L (ref 137–147)

## 2014-02-04 LAB — GLUCOSE, CAPILLARY
Glucose-Capillary: 126 mg/dL — ABNORMAL HIGH (ref 70–99)
Glucose-Capillary: 156 mg/dL — ABNORMAL HIGH (ref 70–99)
Glucose-Capillary: 172 mg/dL — ABNORMAL HIGH (ref 70–99)

## 2014-02-04 MED ORDER — FUROSEMIDE 10 MG/ML IJ SOLN
40.0000 mg | Freq: Once | INTRAMUSCULAR | Status: AC
  Administered 2014-02-04: 40 mg via INTRAVENOUS
  Filled 2014-02-04: qty 4

## 2014-02-04 MED ORDER — CEFTRIAXONE SODIUM IN DEXTROSE 20 MG/ML IV SOLN
1.0000 g | INTRAVENOUS | Status: DC
  Administered 2014-02-04 – 2014-02-07 (×4): 1 g via INTRAVENOUS
  Filled 2014-02-04 (×4): qty 50

## 2014-02-04 MED ORDER — METHYLPREDNISOLONE SODIUM SUCC 40 MG IJ SOLR
40.0000 mg | Freq: Four times a day (QID) | INTRAMUSCULAR | Status: DC
  Administered 2014-02-04 – 2014-02-05 (×4): 40 mg via INTRAVENOUS
  Filled 2014-02-04 (×8): qty 1

## 2014-02-04 MED ORDER — POTASSIUM CHLORIDE 20 MEQ/15ML (10%) PO SOLN
40.0000 meq | Freq: Once | ORAL | Status: AC
  Administered 2014-02-04: 40 meq
  Filled 2014-02-04: qty 30

## 2014-02-04 NOTE — Progress Notes (Addendum)
PULMONARY / CRITICAL CARE MEDICINE   Name: Summer Bradley MRN: [id number] DOB: [birth year]    ADMISSION DATE:  01/29/2014 CONSULTATION DATE:  12/14  REFERRING MD : Neuro  CHIEF COMPLAINT: Left sided weakness  HISTORY OF PRESENT ILLNESS:   35 F with COPD, PAF admitted with acute CVA to Stroke Service after clot retrieval from R MCA  STUDIES/SIGNIFICANT EVENTS: 12/14 CTA head: Occlusive thrombus at the right M1-M2 junction. Subjectively good pial/pial collateral circulation. Perfusion findings suggest moderate acute infarct in the central right MCA territory with rim of penumbra, as above. Remote infarcts at the anterior and posterior margins of the right MCA territory. Thin posterior falx subdural hemorrhage  12/14 admitted to stroke  service after Neuro IR clot retrieval from R MCA. Intubated for procedure. PCCM consultation 12/14 CT head: Acute infarct right MCA territory as noted on CT perfusion study. There is interval development of high density in the right  temporoparietal cortex which may be due to contrast material or hemorrhage.  12/14 Echocardiogram: Lvef 50-55% 12/15 Ct head: Mild cytotoxic edema suspected in that same right MCA territory. Underlying chronic ischemic changes. Stable parafalcine subdural hematoma. No new intracranial hemorrhage or acute mass effect. No new intracranial abnormality 12/15 Extubated 12/16 Transferred out of ICU. PCCM signed off 12/16 MRI brain: Acute nonhemorrhagic infarct involving the superior right temporal lobe, right insular ribbon, right frontal operculum, and posterior right frontal lobe. More remote infarcts are again noted in the more anterior inferior right frontal lobe and the right parietal lobe. Extensive white matter changes bilaterally likely reflect the sequela of chronic microvascular ischemia, advanced for age. White matter changes extend into the brainstem. Remote tender infarcts of the cerebellum bilaterally, worse on the right. 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 Early edema suspected in the thalami right greater than left  12/17 Repeat cerebral angiogram by Neuro IR with clot retrieval. Remained intubated after procedure 12/17 CT head: Large RIGHT MCA territory infarct extending to RIGHT basal ganglia. Underlying atrophy and small vessel chronic ischemic changes. Stable parafalcine subdural hematoma. No new intracranial abnormalities  12/18 failed SBT due to tachypnea and low Vt. Wheezing noted 12/18 MRI brain: New, acute infarcts in the posterior circulation related to treated basilar thrombosis. Infarcts present in the bilateral cerebellum, upper right brainstem, mesial right temporal lobe, and minimally along the right occipital cortex  SUBJECTIVE:  More alert but failed SBT.  VITAL SIGNS: Temp:  [97.7 F (36.5 C)-98.4 F (36.9 C)] 97.7 F (36.5 C) (12/20 0800) Pulse Rate:  [53-145] 74 (12/20 0600) Resp:  [21-39] 23 (12/20 0600) BP: (100-160)/(55-87) 131/70 mmHg (12/20 0600) SpO2:  [91 %-100 %] 91 % (12/20 0745) FiO2 (%):  [40 %] 40 % (12/20 0749) HEMODYNAMICS: CV stable  HR is better   VENTILATOR SETTINGS: Vent Mode:  [-] PSV FiO2 (%):  [40 %] 40 % Set Rate:  [14 bmp] 14 bmp Vt Set:  [40 mL-400 mL] 400 mL PEEP:  [5 cmH20] 5 cmH20 Pressure Support:  [12 cmH20] 12 cmH20 Plateau Pressure:  [18 cmH20-23 cmH20] 18 cmH20 INTAKE / OUTPUT:  Intake/Output Summary (Last 24 hours) at 02/04/14 0911 Last data filed at 02/04/14 0700  Gross per 24 hour  Intake   1200 ml  Output   3015 ml  Net  -1815 ml    PHYSICAL EXAMINATION: General:  RASS 0, + F/C Neuro: Does not open eyes, LUE weakness HEENT: abrasion to L eye unchanged Cardiovascular:  IRIR, tachy,  no M Lungs: persistent wheeze and copious secretions Abdomen:  Obese, large midline scar Musculoskeletal:  intact Skin:  Warm , no lower ext edema  LABS: I have reviewed all of today's lab results.  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   Mixed simple and mucopurulent chronic bronchitis   Paroxysmal atrial fibrillation   Acute respiratory failure   Wheezing   Acute tracheobronchitis   Atelectasis of left lung   NEUROLOGIC A:   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 ICU associated discomfort  P:   RASS goal: 0 Cont PRN fentanyl Stroke mgmt per Stroke team , case discussed with oncall neurology  PULMONARY OETT 12/14>>12/15, 12/17 >> A: VDRF   COPD with bronchospasm Suspect mild pulm edema P:   Cont full vent support - settings reviewed and/or adjusted Failed SBT Not ready for extubation Cont vent bundle Daily SBT Cont BDs Add steroids IV 12/20 Add IV rocephin 12/20 Cult trach aspirate 12/20 Diurese further  CARDIOVASCULAR RUE PICC 12/18 >>  A:  Chronic HTN PAF with RVR>>better on cardiazem Sedation related hypotension Echo EF 50% P:  SBP goal 120-130 mmHg Cont  cardiazem Cont PRN metoprolol Decision re: timing of full anticoagulation per Stroke team>> they recommend starting a DOAC end of coming week One more dose lasix  RENAL A:  No acute issue P:   Monitor BMET intermittently Monitor I/Os Correct electrolytes as indicated Avoid free water  GASTROINTESTINAL A:   No issues P:   SUP: enteral pantoprazole Cont TFs per protocol  HEMATOLOGIC A:   ICU acquired anemia without overt bleeding ABX:    Rocephin 12/20>>>  Cultures:  Resp cult 12/20>>>  P:  DVT px: SCDs Monitor CBC intermittently Transfuse per usual ICU guidelines  INFECTIOUS A:  No overt infectious process P:   Monitor temp, WBC count  ENDOCRINE A:   Hyperglycemia d/t steroids P:   Cont SSI  FAMILY  - Updates: daughters updated @ bedside    CCM X 35 mins  Glyn Ade  [phone]  Cell  [phone]  If no response or cell goes to voicemail, call beeper [phone] 9:11 AM 02/04/2014

## 2014-02-04 NOTE — Progress Notes (Signed)
SBT stopped due to increased RR to 40 BPM and patient stated that she was SOB.

## 2014-02-04 NOTE — Progress Notes (Signed)
STROKE TEAM PROGRESS NOTE   HISTORY Summer Bradley is a 68 y.o. female who got up around 4:30 AM 01/29/2014 to go to the bathroom and then sat down in her chair. She is not certain if her left arm was working at that time or not. She then fell out of her chair around 5 AM and that time noticed that her left arm wasn't working. She did not realize that anything was wrong until she fell out of her chair.  She was in her normal state of health ongoing to bed the evening prior. She was last known well at 8p on 01/28/2014.  In the ER, a CT was obtained which shows evidence of a previous stroke, and a CT perfusion scan was obtained which shows a significant mismatch demonstrating an area of penumbra, though there is an smaller area of likely infarct core as well.  Patient was not administered TPA secondary to being  outside of the tPA window with question of subdural hematoma on CT. Due to mismatch, she was taken to neuro intervention where she had complete revascularization of RT MCA M1 occlusion using 2 passes with the Solitaire and 10 mg of IA Integrelin. TICI 3 flow reesablished. Afterwards, she was admitted to the neuro ICU for further evaluation and treatment.   SUBJECTIVE (INTERVAL HISTORY) The patient has been able to follow commands. She remains intubated, unable to speak.  Failed extubation trial.    OBJECTIVE Temp:  [97.7 F (36.5 C)-98.4 F (36.9 C)] 97.7 F (36.5 C) (12/20 0800) Pulse Rate:  [53-145] 74 (12/20 0600) Cardiac Rhythm:  [-] Atrial fibrillation (12/19 2000) Resp:  [21-39] 23 (12/20 0600) BP: (100-160)/(55-87) 131/70 mmHg (12/20 0600) SpO2:  [91 %-100 %] 91 % (12/20 0745) FiO2 (%):  [40 %] 40 % (12/20 0749)   Recent Labs Lab 02/02/14 2137 02/03/14 0813 02/03/14 1557 02/03/14 2339 02/04/14 0755  GLUCAP 88 120* 96 111* 126*    Recent Labs Lab 01/29/14 0609 01/29/14 0616 01/30/14 0337 02/02/14 0245 02/03/14 0540 02/04/14 0220  NA 140 139 140 145 143 145  K  4.2 3.9 4.2 3.7 3.6* 3.7  CL 100 101 105 109 108 106  CO2 24  --  24 25 26 30   GLUCOSE 128* 131* 101* 92 128* 123*  BUN 15 17 11 13 18 23   CREATININE 0.76 0.80 0.72 0.61 0.57 0.57  CALCIUM 9.2  --  7.8* 7.9* 8.1* 8.4  MG  --   --  1.6  --   --   --   PHOS  --   --  2.2*  --   --   --     Recent Labs Lab 01/29/14 0609  AST 19  ALT 17  ALKPHOS 61  BILITOT 1.7*  PROT 7.0  ALBUMIN 3.2*    Recent Labs Lab 01/29/14 0609  01/30/14 0337 02/01/14 1039 02/02/14 0245 02/03/14 0540 02/04/14 0220  WBC 13.9*  --  13.0* 11.9* 12.3* 10.6* 8.7  NEUTROABS 10.7*  --  10.5* 9.7* 10.1*  --   --   HGB 11.8*  < > 9.9* 8.1* 8.4* 8.3* 8.6*  HCT 39.0  < > 32.6* 27.4* 29.0* 27.6* 29.1*  MCV 82.6  --  84.5 83.3 83.1 82.6 84.8  PLT 195  --  201 234 228 280 339  < > = values in this interval not displayed. No results for input(s): CKTOTAL, CKMB, CKMBINDEX, TROPONINI in the last 168 hours. No results for input(s): LABPROT, INR in the last  72 hours. No results for input(s): COLORURINE, LABSPEC, [place], GLUCOSEU, HGBUR, BILIRUBINUR, KETONESUR, PROTEINUR, UROBILINOGEN, NITRITE, LEUKOCYTESUR in the last 72 hours.  Invalid input(s): APPERANCEUR     Component Value Date/Time   CHOL 114 01/30/2014 0337   TRIG 102 01/30/2014 0337   HDL 24* 01/30/2014 0337   CHOLHDL 4.8 01/30/2014 0337   VLDL 20 01/30/2014 0337   LDLCALC 70 01/30/2014 0337   Lab Results  Component Value Date   HGBA1C 6.4* 01/30/2014      Component Value Date/Time   LABOPIA NONE DETECTED 01/29/2014 0659   COCAINSCRNUR NONE DETECTED 01/29/2014 0659   LABBENZ NONE DETECTED 01/29/2014 0659   AMPHETMU NONE DETECTED 01/29/2014 0659   THCU NONE DETECTED 01/29/2014 0659   LABBARB NONE DETECTED 01/29/2014 0659     Recent Labs Lab 01/29/14 0609  ETH <11    Ct Head Wo Contrast  01/30/2014    1. Resolved right MCA territory hyperdensity present at 1056 hrs yesterday, most likely was post Neurointervention parenchymal contrast  staining. 2. Mild cytotoxic edema suspected in that same right MCA territory. Underlying chronic ischemic changes. 3. Stable parafalcine subdural hematoma. No new intracranial hemorrhage or acute mass effect. 4. No new intracranial abnormality.    01/29/2014    Acute infarct right MCA territory as noted on CT perfusion study. There is interval development of high density in the right temporoparietal cortex which may be due to contrast material or hemorrhage. Continued follow-up recommended.  Small interhemispheric subdural hematoma is unchanged.    01/29/2014   1. 4 mm thick posterior falx subdural hemorrhage. 2. Prominent density at the right M1/M2 junction. CTA is already planned. 3. Established infarcts in the anterior and posterior right MCA territory. No definitive acute infarct. 4. Left frontal scalp contusion.  No acute fracture.    02/01/14: Stat CT scan shows no acute changes in the brain, but there appears to be a thrombus in the basilar artery that is new.   Ct Cerebral Perfusion W/cm 01/29/2014    1. Occlusive thrombus at the right M1-M2 junction. Subjectively good pial/pial collateral circulation. 2. Perfusion findings suggest moderate acute infarct in the central right MCA territory with rim of penumbra, as above. 3. Remote infarcts at the anterior and posterior margins of the right MCA territory. 4. Thin posterior falx subdural hemorrhage.     Cerebral Angiogram 01/29/2014 S/P Bilateral common carotid arteriograms ,followed by complete revascularization of RT MCA M1 occlusion usingX2 passes with the Solitaire FR 106mm x 46mm retrieval device And 10 mg of  superselective intracranial IA Integrelin. TICI 3 flow reesablished.   MRI brain 01/31/14: IMPRESSION: 1. Acute nonhemorrhagic infarct involving the superior right temporal lobe, right insular ribbon, right frontal operculum, and posterior right frontal lobe. 2. More remote infarcts are again noted in the more anterior inferior right  frontal lobe and the right parietal lobe. 3. Extensive white matter changes bilaterally likely reflect the sequela of chronic microvascular ischemia, advanced for age. 4. White matter changes extend into the brainstem. 5. Remote tender infarcts of the cerebellum bilaterally, worse on the right.  MRI brain 02/02/14: IMPRESSION: 1. New, acute infarcts in the posterior circulation related to treated basilar thrombosis. Infarcts present in the bilateral cerebellum, upper right brainstem, mesial right temporal lobe, and minimally along the right occipital cortex. Normal flow voids within the intracranial vessels. 2. Stable appearance of recent right MCA territory infarct. 3. Stable trace falcine subdural hemorrhage.  Dg Chest Port 1 View 01/30/2014    COPD with  bibasilar scarring with superimposed subsegmental atelectasis. There is no evidence of CHF.    01/29/2014    Bilateral perihilar/infrahilar opacification likely interstitial edema and less likely infection.  Endotracheal tube with tip 5.2 cm above the carina.      2D Echocardiogram  EF 50-55% with no source of embolus.    Physical Exam  General: The patient is awake, following commands. The patient is moderately obese. The patient has ecchymosis over the left brow.  Respiratory: Lung fields are clear  Cardiovascular: Occasional irregular heart rhythm, no murmurs noted  Abdomen: Soft  Skin: No significant peripheral edema is noted.   Neurologic Exam  Mental status: The patient is cooperative, unable to open her eyes.  Cranial nerves: Facial symmetry is not present. Patient has some depression of the left nasolabial fold. Pupils are equal, trace reactive, equal. The patient cannot generate any horizontal or vertical eye movements. She is unable to open her eyes spontaneously. There is no blink to threat from either side.  Motor: The patient is able to move both legs fairly well, the left arm appears to be plegic. The  patient has good control of the right arm.  Sensory examination: The patient has decreased sensation on the left arm, seems to have symmetric sensation on the legs.  Coordination: No obvious ataxia seen on the extremities. Difficult to test.  Gait and station: Gait was not tested.  Reflexes: Deep tendon reflexes are symmetric.    ASSESSMENT/PLAN Ms. Marvina Danner is a 68 y.o. female with history of hypertension, stroke, afib and COPD presenting with Left-sided weakness. She did not receive IV t-PA due to delay in arrival, ? SDH.   Stroke:  Suspect right MCA infarct status post complete vascularization of right MCA M1 occlusion with IA Integrilin and mechanical thrombectomy, workup underway.   2D Echo  No source of embolus  HgbA1c 6.4  SCDs for VTE prophylaxis      aspirin 81 mg orally every day and warfarin prior to admission, added aspirin PR or PO.  Will need to Start NOAC  Ongoing aggressive stroke risk factor management  Therapy recommendations:    TF  Acute Respiratory Failure  Intubated for neuro intervention- failed extubation trial.   Likely new LLL PNA  Critical care managed ventilator   Atrial Fibrillation  Home meds:  Warfarin  INR subtherapeutic on admission, INR 1.18    started Cardizem and s/p d/c amiodarone.   Will need NOAC probably sometime next week.    Hypertension   Continue to monitor  On Cardizem, rate controlled this AM, s/p d/c amodarone   Hyperlipidemia  Home meds:  Mevacor  LDL 70, goal < 70  Reason Mevacor once able to swallow  Other Stroke Risk Factors  Advanced age  Obesity, Body mass index is 36.28 kg/(m^2).   Hx stroke/TIA  Other Active Problems  COPD  Depression, on chronic SSRI  Hospital day # 6  Leotis Pain    To contact Stroke Continuity provider, please refer to [email or web address] After hours, contact General Neurology

## 2014-02-05 ENCOUNTER — Encounter (HOSPITAL_COMMUNITY): Payer: Self-pay | Admitting: Interventional Radiology

## 2014-02-05 ENCOUNTER — Inpatient Hospital Stay (HOSPITAL_COMMUNITY): Payer: Medicare HMO

## 2014-02-05 DIAGNOSIS — J9601 Acute respiratory failure with hypoxia: Secondary | ICD-10-CM

## 2014-02-05 DIAGNOSIS — J441 Chronic obstructive pulmonary disease with (acute) exacerbation: Secondary | ICD-10-CM

## 2014-02-05 LAB — BASIC METABOLIC PANEL
Anion gap: 10 (ref 5–15)
BUN: 30 mg/dL — ABNORMAL HIGH (ref 6–23)
CO2: 31 mEq/L (ref 19–32)
Calcium: 9.3 mg/dL (ref 8.4–10.5)
Chloride: 104 mEq/L (ref 96–112)
Creatinine, Ser: 0.55 mg/dL (ref 0.50–1.10)
GFR calc Af Amer: >90 mL/min (ref >90)
GFR calc non Af Amer: >90 mL/min (ref >90)
Glucose, Bld: 154 mg/dL — ABNORMAL HIGH (ref 70–99)
Potassium: 3.8 mEq/L (ref 3.7–5.3)
Sodium: 145 mEq/L (ref 137–147)

## 2014-02-05 LAB — CBC
HCT: 29.6 % — ABNORMAL LOW (ref 36.0–46.0)
Hemoglobin: 8.7 g/dL — ABNORMAL LOW (ref 12.0–15.0)
MCH: 24.2 pg — ABNORMAL LOW (ref 26.0–34.0)
MCHC: 29.4 g/dL — ABNORMAL LOW (ref 30.0–36.0)
MCV: 82.2 fL (ref 78.0–100.0)
Platelets: 404 10*3/uL — ABNORMAL HIGH (ref 150–400)
RBC: 3.6 MIL/uL — ABNORMAL LOW (ref 3.87–5.11)
RDW: 19.8 % — ABNORMAL HIGH (ref 11.5–15.5)
WBC: 10.4 10*3/uL (ref 4.0–10.5)

## 2014-02-05 LAB — GLUCOSE, CAPILLARY
Glucose-Capillary: 146 mg/dL — ABNORMAL HIGH (ref 70–99)
Glucose-Capillary: 174 mg/dL — ABNORMAL HIGH (ref 70–99)

## 2014-02-05 LAB — CLOSTRIDIUM DIFFICILE BY PCR: Toxigenic C. Difficile by PCR: NEGATIVE

## 2014-02-05 MED ORDER — BUDESONIDE 0.25 MG/2ML IN SUSP
0.5000 mg | Freq: Two times a day (BID) | RESPIRATORY_TRACT | Status: DC
  Administered 2014-02-05 – 2014-02-11 (×10): 0.5 mg via RESPIRATORY_TRACT
  Filled 2014-02-05 (×17): qty 4

## 2014-02-05 MED ORDER — METHYLPREDNISOLONE SODIUM SUCC 40 MG IJ SOLR
40.0000 mg | Freq: Two times a day (BID) | INTRAMUSCULAR | Status: DC
  Administered 2014-02-05 – 2014-02-06 (×2): 40 mg via INTRAVENOUS
  Filled 2014-02-05 (×4): qty 1

## 2014-02-05 MED ORDER — APIXABAN 5 MG PO TABS
5.0000 mg | ORAL_TABLET | Freq: Two times a day (BID) | ORAL | Status: DC
  Administered 2014-02-05: 5 mg via ORAL
  Filled 2014-02-05 (×5): qty 1

## 2014-02-05 MED ORDER — BISACODYL 10 MG RE SUPP
10.0000 mg | Freq: Every day | RECTAL | Status: DC | PRN
  Administered 2014-02-05 – 2014-02-18 (×2): 10 mg via RECTAL
  Filled 2014-02-05 (×2): qty 1

## 2014-02-05 MED ORDER — FUROSEMIDE 10 MG/ML IJ SOLN
40.0000 mg | Freq: Two times a day (BID) | INTRAMUSCULAR | Status: DC
  Administered 2014-02-05 – 2014-02-06 (×3): 40 mg via INTRAVENOUS
  Filled 2014-02-05 (×4): qty 4

## 2014-02-05 MED ORDER — POLYETHYLENE GLYCOL 3350 17 G PO PACK
17.0000 g | PACK | Freq: Every day | ORAL | Status: DC | PRN
  Filled 2014-02-05: qty 1

## 2014-02-05 NOTE — Progress Notes (Signed)
STROKE TEAM PROGRESS NOTE   HISTORY Summer Bradley is a 68 y.o. female who got up around 4:30 AM 01/29/2014 to go to the bathroom and then sat down in her chair. She is not certain if her left arm was working at that time or not. She then fell out of her chair around 5 AM and that time noticed that her left arm wasn't working. She did not realize that anything was wrong until she fell out of her chair.  She was in her normal state of health ongoing to bed the evening prior. She was last known well at 8p on 01/28/2014.  In the ER, a CT was obtained which shows evidence of a previous stroke, and a CT perfusion scan was obtained which shows a significant mismatch demonstrating an area of penumbra, though there is an smaller area of likely infarct core as well.  Patient was not administered TPA secondary to being  outside of the tPA window with question of subdural hematoma on CT. Due to mismatch, she was taken to neuro intervention where she had complete revascularization of RT MCA M1 occlusion using 2 passes with the Solitaire and 10 mg of IA Integrelin. TICI 3 flow reesablished. Afterwards, she was admitted to the neuro ICU for further evaluation and treatment.   SUBJECTIVE (INTERVAL HISTORY) The patient has been able to follow simple commands. She remains intubated and failed extubation trial. Unable to speak but making body languages. Had no bowel movement for several days, will start stool softener. Will start eliquis today.  OBJECTIVE Temp:  [97.7 F (36.5 C)-99.3 F (37.4 C)] 99 F (37.2 C) (12/21 1146) Pulse Rate:  [58-93] 93 (12/21 1300) Cardiac Rhythm:  [-] Atrial fibrillation (12/21 1300) Resp:  [20-37] 21 (12/21 1300) BP: (111-135)/(53-71) 130/65 mmHg (12/21 1300) SpO2:  [91 %-97 %] 93 % (12/21 1300) FiO2 (%):  [40 %] 40 % (12/21 1300)   Recent Labs Lab 02/03/14 2339 02/04/14 0755 02/04/14 1605 02/04/14 2322 02/05/14 0805  GLUCAP 111* 126* 156* 172* 146*    Recent Labs Lab  01/30/14 0337 02/02/14 0245 02/03/14 0540 02/04/14 0220 02/05/14 0220  NA 140 145 143 145 145  K 4.2 3.7 3.6* 3.7 3.8  CL 105 109 108 106 104  CO2 24 25 26 30 31   GLUCOSE 101* 92 128* 123* 154*  BUN 11 13 18 23  30*  CREATININE 0.72 0.61 0.57 0.57 0.55  CALCIUM 7.8* 7.9* 8.1* 8.4 9.3  MG 1.6  --   --   --   --   PHOS 2.2*  --   --   --   --    No results for input(s): AST, ALT, ALKPHOS, BILITOT, PROT, ALBUMIN in the last 168 hours.  Recent Labs Lab 01/30/14 0337 02/01/14 1039 02/02/14 0245 02/03/14 0540 02/04/14 0220 02/05/14 0220  WBC 13.0* 11.9* 12.3* 10.6* 8.7 10.4  NEUTROABS 10.5* 9.7* 10.1*  --   --   --   HGB 9.9* 8.1* 8.4* 8.3* 8.6* 8.7*  HCT 32.6* 27.4* 29.0* 27.6* 29.1* 29.6*  MCV 84.5 83.3 83.1 82.6 84.8 82.2  PLT 201 234 228 280 339 404*   No results for input(s): CKTOTAL, CKMB, CKMBINDEX, TROPONINI in the last 168 hours. No results for input(s): LABPROT, INR in the last 72 hours. No results for input(s): COLORURINE, LABSPEC, [place], GLUCOSEU, HGBUR, BILIRUBINUR, KETONESUR, PROTEINUR, UROBILINOGEN, NITRITE, LEUKOCYTESUR in the last 72 hours.  Invalid input(s): APPERANCEUR     Component Value Date/Time   CHOL 114 01/30/2014  7673   TRIG 102 01/30/2014 0337   HDL 24* 01/30/2014 0337   CHOLHDL 4.8 01/30/2014 0337   VLDL 20 01/30/2014 0337   LDLCALC 70 01/30/2014 0337   Lab Results  Component Value Date   HGBA1C 6.4* 01/30/2014      Component Value Date/Time   LABOPIA NONE DETECTED 01/29/2014 0659   COCAINSCRNUR NONE DETECTED 01/29/2014 0659   LABBENZ NONE DETECTED 01/29/2014 0659   AMPHETMU NONE DETECTED 01/29/2014 0659   THCU NONE DETECTED 01/29/2014 0659   LABBARB NONE DETECTED 01/29/2014 0659    No results for input(s): ETH in the last 168 hours.  Ct Head Wo Contrast  01/30/2014    1. Resolved right MCA territory hyperdensity present at 1056 hrs yesterday, most likely was post Neurointervention parenchymal contrast staining. 2. Mild cytotoxic  edema suspected in that same right MCA territory. Underlying chronic ischemic changes. 3. Stable parafalcine subdural hematoma. No new intracranial hemorrhage or acute mass effect. 4. No new intracranial abnormality.    01/29/2014    Acute infarct right MCA territory as noted on CT perfusion study. There is interval development of high density in the right temporoparietal cortex which may be due to contrast material or hemorrhage. Continued follow-up recommended.  Small interhemispheric subdural hematoma is unchanged.    01/29/2014   1. 4 mm thick posterior falx subdural hemorrhage. 2. Prominent density at the right M1/M2 junction. CTA is already planned. 3. Established infarcts in the anterior and posterior right MCA territory. No definitive acute infarct. 4. Left frontal scalp contusion.  No acute fracture.    02/01/14: Stat CT scan shows no acute changes in the brain, but there appears to be a thrombus in the basilar artery that is new.   Ct Cerebral Perfusion W/cm 01/29/2014    1. Occlusive thrombus at the right M1-M2 junction. Subjectively good pial/pial collateral circulation. 2. Perfusion findings suggest moderate acute infarct in the central right MCA territory with rim of penumbra, as above. 3. Remote infarcts at the anterior and posterior margins of the right MCA territory. 4. Thin posterior falx subdural hemorrhage.     Cerebral Angiogram 01/29/2014 S/P Bilateral common carotid arteriograms ,followed by complete revascularization of RT MCA M1 occlusion usingX2 passes with the Solitaire FR 55mm x 43mm retrieval device And 10 mg of  superselective intracranial IA Integrelin. TICI 3 flow reesablished.   MRI brain 01/31/14: IMPRESSION: 1. Acute nonhemorrhagic infarct involving the superior right temporal lobe, right insular ribbon, right frontal operculum, and posterior right frontal lobe. 2. More remote infarcts are again noted in the more anterior inferior right frontal lobe and the right  parietal lobe. 3. Extensive white matter changes bilaterally likely reflect the sequela of chronic microvascular ischemia, advanced for age. 4. White matter changes extend into the brainstem. 5. Remote tender infarcts of the cerebellum bilaterally, worse on the right.  MRI brain 02/02/14: IMPRESSION: 1. New, acute infarcts in the posterior circulation related to treated basilar thrombosis. Infarcts present in the bilateral cerebellum, upper right brainstem, mesial right temporal lobe, and minimally along the right occipital cortex. Normal flow voids within the intracranial vessels. 2. Stable appearance of recent right MCA territory infarct. 3. Stable trace falcine subdural hemorrhage.  Dg Chest Port 1 View 01/30/2014    COPD with bibasilar scarring with superimposed subsegmental atelectasis. There is no evidence of CHF.    01/29/2014    Bilateral perihilar/infrahilar opacification likely interstitial edema and less likely infection.  Endotracheal tube with tip 5.2 cm above the carina.  2D Echocardiogram  EF 50-55% with no source of embolus.   A1C 6.4 adn LDL 70  Physical Exam  General: The patient is awake, following simple commands. The patient is moderately obese. The patient has ecchymosis over the left brow.  Respiratory: Lung fields are clear  Cardiovascular: Occasional irregular heart rhythm, no murmurs noted  Abdomen: Soft  Skin: No significant peripheral edema is noted.  Neurologic Exam  Mental status: The patient is cooperative, follows simple commands. Still intubated due to failure of weaning trials.   Cranial nerves: Facial symmetry difficult to test due to intubation. Pupils are equal, reactive to light. Left ptosis. The patient cannot generate any horizontal or vertical eye movements. There is blink to threat from both eyes.  Motor: The patient is able to move both legs fairly well, the left arm appears to be plegic. The patient has good control of the  right arm.  Sensory examination: The patient has decreased sensation on the left arm, seems to have symmetric sensation on the legs.  Coordination: No obvious ataxia seen on the extremities. Difficult to test.  Gait and station: Gait was not tested.  Reflexes: Deep tendon reflexes are symmetric.  ASSESSMENT/PLAN Summer Bradley is a 68 y.o. female with history of hypertension, stroke, afib and COPD presenting with Left-sided weakness. She did not receive IV t-PA due to delay in arrival, ? SDH. But underwent mechanical thrombectomy of right MCA with complete vascularization of right MCA M1 occlusion with IA Integrilin. 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 Dr. Estanislado Pandy was able to extract the clot, and the patient has sustained a right pontine and midbrain stroke, small bilateral cerebellar strokes, and some extension of the infarct in the mesial temporal area on the right. Intubated for the procedure but not able to extubate as for now.  Stroke:  Suspect right MCA infarct and brainstem status post mechanical thrombectomy x 2. Etiology is consistent with Afib with subtherapeutic INR on coumadin  2D Echo  No source of embolus  HgbA1c 6.4  lovenos for VTE prophylaxis  Resultant eft arm weakness.   aspirin 81 mg orally every day and warfarin prior to admission  Start NOAC today, will d/c ASA and lovenox  Neurologically stable   PT/OT on board  Acute Respiratory Failure  Intubated for neuro intervention- failed extubation trial.   Likely new LLL PNA  Critical care managed ventilator  Atrial Fibrillation  Home meds:  Warfarin  INR subtherapeutic on admission, INR 1.18   started Cardizem and s/p d/c amiodarone.   Will start NOAC today.    Hypertension  Continue to monitor  BP stable 120-140  Hyperlipidemia  Home meds:  Mevacor  LDL 70, goal < 70  Reason Mevacor once able to swallow  Constipation  Stool  softener  PRN suppository  Other Stroke Risk Factors  Advanced age  Obesity, Body mass index is 36.28 kg/(m^2).   Hx stroke/TIA  Other Active Problems  COPD  Depression, on chronic SSRI  Hospital day # 7   This patient is critically ill due to multifocal embolic infarcts and at significant risk of neurological worsening, death form further embolic strokes, bleeding from anticoagulation. This patient's care requires constant monitoring of vital signs, hemodynamics, respiratory and cardiac monitoring, review of multiple databases, neurological assessment, discussion with family, other specialists and medical decision making of high complexity. I reviewed her images in person and discussed extensively with family at bedside. I spent 40 minutes of neurocritical  care time in the care of this patient.  Rosalin Hawking, MD PhD Stroke Neurology 02/05/2014 2:22 PM   To contact Stroke Continuity provider, please refer to [email or web address] After hours, contact General Neurology

## 2014-02-05 NOTE — Progress Notes (Signed)
Rehab admissions - I am following pt's case for my coworker, Genie and noted that pt remains on vent support.  Per latest critical care MD note: "12/18 failed SBT due to tachypnea and low Vt. Wheezing noted. 12/18 MRI brain: New, acute infarcts in the posterior circulation related to treated basilar thrombosis. Infarcts present in the bilateral cerebellum, upper right brainstem, mesial right temporal lobe, and minimally along the right occipital cortex."  Per Genie's previous note, "I received a call from [employer] carrier.  They have denied inpatient rehab admission.  They feel that she cannot tolerate intense therapies and they feel that SNF will be better option for this patient."  We will now sign off pt's case in light of pt's continued status on vent and insurance denial. If pt's status markedly improves and she is weaned from the ventilator, please reach out to admission coordinator. At this point, we recommend that SNF or LTAC be pursued.  Please call me with any questions. Thanks.  Nanetta Batty, PT Rehabilitation Admissions Coordinator [phone]

## 2014-02-05 NOTE — Progress Notes (Signed)
NUTRITION FOLLOW-UP  DOCUMENTATION CODES Per approved criteria  -Obesity Unspecified   INTERVENTION: Vital High Protein @ 35 ml/hr via OG tube    30 ml Prostat BID.    Tube feeding regimen provides 1040 kcal, 103 grams of protein, and 702 ml of H2O.   (62 % of kcal needs)   NUTRITION DIAGNOSIS: Inadequate oral intake related to inability to eat as evidenced by NPO status; ongoing.   Goal: Enteral nutrition to provide 60-70% of estimated calorie needs (22-25 kcals/kg ideal body weight) and 100% of estimated protein needs, based on ASPEN guidelines for hypocaloric, high protein feeding in critically ill obese individuals; met.   Monitor:  TF tolerance, weight trends, labs  ASSESSMENT: Acute R MCA CVA, s/p angiography, R MCA clot retrieval 12/14 with new stroke 12/17 and back to IR intervention.  Patient is currently intubated on ventilator support MV: 10.1 L/min Temp (24hrs), Avg:98.2 F (36.8 C), Min:97.7 F (36.5 C), Max:99.3 F (37.4 C)  Propofol: off Pt discussed during ICU rounds and with RN.  Pt failed SBT.   Height: Ht Readings from Last 1 Encounters:  02/01/14 5' 2"  (1.575 m)    Weight: Wt Readings from Last 1 Encounters:  02/01/14 198 lb 6.6 oz (90 kg)  Admission weight: 192 lb (87.2 kg) 12/14   BMI:  Body mass index is 36.28 kg/(m^2).  Estimated Nutritional Needs: Kcal: 1673 Protein: >/= 100 grams Fluid: > 1.6 L/day  Skin: incision, laceration  Diet Order:     Intake/Output Summary (Last 24 hours) at 02/05/14 1002 Last data filed at 02/05/14 0943  Gross per 24 hour  Intake    995 ml  Output   3045 ml  Net  -2050 ml    Last BM: PTA - pt started on Miralax  Labs:   Recent Labs Lab 01/30/14 0337  02/03/14 0540 02/04/14 0220 02/05/14 0220  NA 140  < > 143 145 145  K 4.2  < > 3.6* 3.7 3.8  CL 105  < > 108 106 104  CO2 24  < > 26 30 31   BUN 11  < > 18 23 30*  CREATININE 0.72  < > 0.57 0.57 0.55  CALCIUM 7.8*  < > 8.1* 8.4 9.3   MG 1.6  --   --   --   --   PHOS 2.2*  --   --   --   --   GLUCOSE 101*  < > 128* 123* 154*  < > = values in this interval not displayed.  CBG (last 3)   Recent Labs  02/04/14 1605 02/04/14 2322 02/05/14 0805  GLUCAP 156* 172* 146*    Scheduled Meds: . antiseptic oral rinse  7 mL Mouth Rinse QID  . apixaban  5 mg Oral BID  . aspirin  325 mg Per Tube Daily  . budesonide  0.25 mg Nebulization 4 times per day  . cefTRIAXone (ROCEPHIN)  IV  1 g Intravenous Q24H  . chlorhexidine  15 mL Mouth Rinse BID  . citalopram  20 mg Per Tube QHS  . diltiazem  60 mg Oral 4 times per day  . feeding supplement (PRO-STAT SUGAR FREE 64)  30 mL Per Tube BID  . feeding supplement (VITAL HIGH PROTEIN)  1,000 mL Per Tube Q24H  . ipratropium  0.5 mg Nebulization TID  . levalbuterol  0.63 mg Nebulization Q6H  . levothyroxine  150 mcg Per Tube QAC breakfast  . methylPREDNISolone (SOLU-MEDROL) injection  40 mg Intravenous  4 times per day  . metoprolol  50 mg Per Tube 3 times per day  . pantoprazole sodium  40 mg Per Tube Q1200  . pravastatin  10 mg Per Tube q1800  . sodium chloride  10-40 mL Intracatheter Q12H    Continuous Infusions:    Maylon Peppers RD, [place], [place] [address] Pager [phone] After Hours Pager

## 2014-02-05 NOTE — Progress Notes (Signed)
PULMONARY / CRITICAL CARE MEDICINE   Name: Summer Bradley MRN: [id number] DOB: May 08, 1945    ADMISSION DATE:  01/29/2014 CONSULTATION DATE:  12/14  REFERRING MD : Neuro  CHIEF COMPLAINT: Left sided weakness  HISTORY OF PRESENT ILLNESS:   78 F with COPD, PAF admitted with acute CVA to Stroke Service after clot retrieval from R MCA  STUDIES/SIGNIFICANT EVENTS: 12/14 CTA head: Occlusive thrombus at the right M1-M2 junction. Subjectively good pial/pial collateral circulation. Perfusion findings suggest moderate acute infarct in the central right MCA territory with rim of penumbra, as above. Remote infarcts at the anterior and posterior margins of the right MCA territory. Thin posterior falx subdural hemorrhage  12/14 admitted to stroke  service after Neuro IR clot retrieval from R MCA. Intubated for procedure. PCCM consultation 12/14 CT head: Acute infarct right MCA territory as noted on CT perfusion study. There is interval development of high density in the right  temporoparietal cortex which may be due to contrast material or hemorrhage.  12/14 Echocardiogram: Lvef 50-55% 12/15 Ct head: Mild cytotoxic edema suspected in that same right MCA territory. Underlying chronic ischemic changes. Stable parafalcine subdural hematoma. No new intracranial hemorrhage or acute mass effect. No new intracranial abnormality 12/15 Extubated 12/16 Transferred out of ICU. PCCM signed off 12/16 MRI brain: Acute nonhemorrhagic infarct involving the superior right temporal lobe, right insular ribbon, right frontal operculum, and posterior right frontal lobe. More remote infarcts are again noted in the more anterior inferior right frontal lobe and the right parietal lobe. Extensive white matter changes bilaterally likely reflect the sequela of chronic microvascular ischemia, advanced for age. White matter changes extend into the brainstem. Remote tender infarcts of the cerebellum bilaterally, worse on the right. 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 Early edema suspected in the thalami right greater than left  12/17 Repeat cerebral angiogram by Neuro IR with clot retrieval. Remained intubated after procedure 12/17 CT head: Large RIGHT MCA territory infarct extending to RIGHT basal ganglia. Underlying atrophy and small vessel chronic ischemic changes. Stable parafalcine subdural hematoma. No new intracranial abnormalities  12/18 failed SBT due to tachypnea and low Vt. Wheezing noted 12/18 MRI brain: New, acute infarcts in the posterior circulation related to treated basilar thrombosis. Infarcts present in the bilateral cerebellum, upper right brainstem, mesial right temporal lobe, and minimally along the right occipital cortex  SUBJECTIVE:  More alert Tolerates 12/5 Afebrile  VITAL SIGNS: Temp:  [97.7 F (36.5 C)-99.3 F (37.4 C)] 97.8 F (36.6 C) (12/21 0800) Pulse Rate:  [58-80] 74 (12/21 0900) Resp:  [20-27] 22 (12/21 0900) BP: (111-135)/(53-69) 129/66 mmHg (12/21 0900) SpO2:  [91 %-97 %] 94 % (12/21 0900) FiO2 (%):  [40 %] 40 % (12/21 0749) HEMODYNAMICS: VENTILATOR SETTINGS: Vent Mode:  [-] PSV FiO2 (%):  [40 %] 40 % Set Rate:  [14 bmp] 14 bmp Vt Set:  [400 mL] 400 mL PEEP:  [5 cmH20] 5 cmH20 Plateau Pressure:  [15 cmH20-17 cmH20] 15 cmH20 INTAKE / OUTPUT:  Intake/Output Summary (Last 24 hours) at 02/05/14 1109 Last data filed at 02/05/14 1000  Gross per 24 hour  Intake   1125 ml  Output   3270 ml  Net  -2145 ml    PHYSICAL EXAMINATION: General:  RASS 0, + F/C Neuro: awake, follows commands, RASS 0, LUE weakness 1/5 HEENT: abrasion to L eye unchanged Cardiovascular:  IRIR, tachy, no M Lungs: decreased BS , no wheeze Abdomen:  Obese, large midline scar  Musculoskeletal:  intact Skin:  Warm , no lower ext edema  LABS: I have reviewed all of today's lab results. Relevant abnormalities are discussed in the A/P section  CXR:  edema pattern  ASSESSMENT / PLAN: Active Problems:   Stroke   Respiratory failure   Cerebral infarction due to occlusion or stenosis of precerebral artery   Mixed simple and mucopurulent chronic bronchitis   Paroxysmal atrial fibrillation   Acute respiratory failure   Wheezing   Acute tracheobronchitis   Atelectasis of left lung   NEUROLOGIC A:   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 ICU associated discomfort  P:   RASS goal: 0 Cont PRN fentanyl Stroke mgmt per Stroke team   PULMONARY OETT 12/14>>12/15, 12/17 >> A: VDRF   COPD with bronchospasm Suspect mild pulm edema P:   Cont full vent support - settings reviewed and/or adjusted Cont vent bundle Daily SBT Cont BDs Solumedrol 40 q 12h Diurese   CARDIOVASCULAR RUE PICC 12/18 >>  A:  Chronic HTN PAF with RVR>>better on cardiazem Sedation related hypotension-resolved Echo EF 50% P:  SBP goal 120-130 mmHg Cont  cardiazem Cont PRN metoprolol Ct apixaban Lasix 40 q 12h  RENAL A:  No acute issue P:   Monitor BMET intermittently Monitor I/Os Correct electrolytes as indicated   GASTROINTESTINAL A:   No issues P:   SUP: enteral pantoprazole Cont TFs per protocol  HEMATOLOGIC A:   ICU acquired anemia without overt bleeding ABX:    Rocephin 12/20>>>  Cultures:  Resp cult 12/20>>>  P:  DVT px: SCDs Monitor CBC intermittently Transfuse per usual ICU guidelines  INFECTIOUS A:  No overt infectious process P:   Monitor temp, WBC count  ENDOCRINE A:   Hyperglycemia d/t steroids P:   Cont SSI  FAMILY  - Updates: daughter Jackelyn Poling updated @ bedside 12/21   Summary - weaning on 12/5, Will diurese & see if we can get her to extubation. Hope to avoid trach here.  The patient is critically ill with multiple organ systems failure and requires high complexity decision making for assessment and support, frequent evaluation and titration of therapies, application of advanced  monitoring technologies and extensive interpretation of multiple databases. Critical Care Time devoted to patient care services described in this note independent of APP time is 35 minutes.     [facility] V.MD  11:09 AM 02/05/2014

## 2014-02-05 NOTE — Evaluation (Signed)
Physical Therapy Re-Evaluation Patient Details Name: Summer Bradley MRN: [id number] DOB: 1945/12/08 Today's Date: 02/05/2014   History of Present Illness  68 yo female admitted from [facility] with L side weakness, vision loss and s/p fall hitting L side of face. intubated 01/29/14; 12/14 R MCA clot retrieval with revascularixation. pt extubated 01/30/14 PMH: HTN, CVA, AFIB, COPD, abdominal surg.  pt lethargic and found to have extension, re-intubated on 12/18.    Clinical Impression  Pt orally intubated bu nods head yes/no appropriately.  Pt following all directions and demos ability to move all 4 extremities.  Pt currently on Partial Support on Vent and tolerating well with mobility.  Pt at this time continues to benefit from CIR level of therapies at D/C.  Will continue to follow.      Follow Up Recommendations CIR    Equipment Recommendations   (TBD)    Recommendations for Other Services Rehab consult     Precautions / Restrictions Precautions Precautions: Fall Precaution Comments: pt intubated.   Restrictions Weight Bearing Restrictions: No      Mobility  Bed Mobility Overal bed mobility: Needs Assistance;+2 for physical assistance Bed Mobility: Supine to Sit;Sit to Supine     Supine to sit: Mod assist;+2 for physical assistance Sit to supine: Mod assist;+2 for physical assistance   General bed mobility comments: pt able to A with mobility, but needs A for trunk and Bil LEs.    Transfers                    Ambulation/Gait                Stairs            Wheelchair Mobility    Modified Rankin (Stroke Patients Only) Modified Rankin (Stroke Patients Only) Pre-Morbid Rankin Score: Moderate disability Modified Rankin: Severe disability     Balance Overall balance assessment: Needs assistance Sitting-balance support: Bilateral upper extremity supported Sitting balance-Leahy Scale: Poor Sitting balance - Comments: pt needs Bil UEs to maintain  balance and occasional Min-ModA.                                       Pertinent Vitals/Pain Pain Assessment: No/denies pain    Home Living Family/patient expects to be discharged to:: Inpatient rehab                      Prior Function Level of Independence: Needs assistance   Gait / Transfers Assistance Needed: Used RW for ambulation  ADL's / Homemaking Assistance Needed: Family A with homemaking tasks and pt states she was supposed to have an aide starting on Monday to help with ADLs.          Hand Dominance        Extremity/Trunk Assessment   Upper Extremity Assessment: Defer to OT evaluation           Lower Extremity Assessment: Generalized weakness      Cervical / Trunk Assessment: Normal  Communication   Communication:  (Intubated.)  Cognition Arousal/Alertness: Awake/alert Behavior During Therapy: WFL for tasks assessed/performed Overall Cognitive Status: Difficult to assess                      General Comments      Exercises        Assessment/Plan    PT Assessment Patient needs continued  PT services  PT Diagnosis Difficulty walking;Generalized weakness   PT Problem List Decreased strength;Decreased activity tolerance;Decreased balance;Decreased mobility;Decreased coordination;Decreased knowledge of use of DME;Cardiopulmonary status limiting activity  PT Treatment Interventions DME instruction;Gait training;Functional mobility training;Therapeutic activities;Therapeutic exercise;Balance training;Neuromuscular re-education;Patient/family education   PT Goals (Current goals can be found in the Care Plan section) Acute Rehab PT Goals Patient Stated Goal: Back to normal.   PT Goal Formulation: With patient/family Time For Goal Achievement: 02/19/14 Potential to Achieve Goals: Good    Frequency Min 4X/week   Barriers to discharge        Co-evaluation               End of Session Equipment Utilized  During Treatment:  (Vent) Activity Tolerance: Patient tolerated treatment well Patient left: in bed;with call bell/phone within reach;with family/visitor present Nurse Communication: Mobility status         Time: 0630-1601 PT Time Calculation (min) (ACUTE ONLY): 19 min   Charges:   PT Evaluation $PT Re-evaluation: 1 Procedure     PT G CodesCatarina Hartshorn, [place] 02/05/2014, 10:27 AM

## 2014-02-06 ENCOUNTER — Inpatient Hospital Stay (HOSPITAL_COMMUNITY): Payer: Medicare HMO

## 2014-02-06 DIAGNOSIS — J189 Pneumonia, unspecified organism: Secondary | ICD-10-CM

## 2014-02-06 LAB — BASIC METABOLIC PANEL
Anion gap: 15 (ref 5–15)
BUN: 31 mg/dL — ABNORMAL HIGH (ref 6–23)
CO2: 29 mmol/L (ref 19–32)
Calcium: 8.9 mg/dL (ref 8.4–10.5)
Chloride: 100 mEq/L (ref 96–112)
Creatinine, Ser: 0.76 mg/dL (ref 0.50–1.10)
GFR calc Af Amer: >90 mL/min (ref >90)
GFR calc non Af Amer: 84 mL/min — ABNORMAL LOW (ref >90)
Glucose, Bld: 145 mg/dL — ABNORMAL HIGH (ref 70–99)
Potassium: 3.1 mmol/L — ABNORMAL LOW (ref 3.5–5.1)
Sodium: 144 mmol/L (ref 135–145)

## 2014-02-06 LAB — GLUCOSE, CAPILLARY: Glucose-Capillary: 169 mg/dL — ABNORMAL HIGH (ref 70–99)

## 2014-02-06 LAB — CBC
HCT: 30.7 % — ABNORMAL LOW (ref 36.0–46.0)
Hemoglobin: 9 g/dL — ABNORMAL LOW (ref 12.0–15.0)
MCH: 23.9 pg — ABNORMAL LOW (ref 26.0–34.0)
MCHC: 29.3 g/dL — ABNORMAL LOW (ref 30.0–36.0)
MCV: 81.6 fL (ref 78.0–100.0)
Platelets: 504 10*3/uL — ABNORMAL HIGH (ref 150–400)
RBC: 3.76 MIL/uL — ABNORMAL LOW (ref 3.87–5.11)
RDW: 19.9 % — ABNORMAL HIGH (ref 11.5–15.5)
WBC: 12 10*3/uL — ABNORMAL HIGH (ref 4.0–10.5)

## 2014-02-06 LAB — CULTURE, RESPIRATORY W GRAM STAIN: Special Requests: NORMAL

## 2014-02-06 MED ORDER — POTASSIUM CHLORIDE 10 MEQ/50ML IV SOLN
10.0000 meq | INTRAVENOUS | Status: AC
  Administered 2014-02-06 (×4): 10 meq via INTRAVENOUS
  Filled 2014-02-06 (×4): qty 50

## 2014-02-06 MED ORDER — LEVALBUTEROL HCL 0.63 MG/3ML IN NEBU
0.6300 mg | INHALATION_SOLUTION | Freq: Three times a day (TID) | RESPIRATORY_TRACT | Status: DC
  Administered 2014-02-07 – 2014-02-09 (×8): 0.63 mg via RESPIRATORY_TRACT
  Filled 2014-02-06 (×14): qty 3

## 2014-02-06 MED ORDER — METOPROLOL TARTRATE 50 MG PO TABS
50.0000 mg | ORAL_TABLET | Freq: Two times a day (BID) | ORAL | Status: DC
Start: 2014-02-06 — End: 2014-02-06
  Filled 2014-02-06: qty 1

## 2014-02-06 MED ORDER — BISOPROLOL FUMARATE 5 MG PO TABS
2.5000 mg | ORAL_TABLET | Freq: Two times a day (BID) | ORAL | Status: DC
  Administered 2014-02-07 – 2014-02-28 (×41): 2.5 mg via ORAL
  Administered 2014-03-01: 5 mg via ORAL
  Administered 2014-03-02 – 2014-03-05 (×6): 2.5 mg via ORAL
  Filled 2014-02-06 (×58): qty 0.5

## 2014-02-06 MED ORDER — METHYLPREDNISOLONE SODIUM SUCC 40 MG IJ SOLR
40.0000 mg | Freq: Every day | INTRAMUSCULAR | Status: DC
Start: 2014-02-07 — End: 2014-02-11
  Administered 2014-02-07 – 2014-02-11 (×5): 40 mg via INTRAVENOUS
  Filled 2014-02-06 (×5): qty 1

## 2014-02-06 MED ORDER — SODIUM CHLORIDE 0.9 % IV SOLN: INTRAVENOUS | Status: DC

## 2014-02-06 MED ORDER — PRAVASTATIN SODIUM 20 MG PO TABS
20.0000 mg | ORAL_TABLET | Freq: Every day | ORAL | Status: DC
  Administered 2014-02-07 – 2014-03-04 (×26): 20 mg
  Filled 2014-02-06 (×29): qty 1

## 2014-02-06 MED ORDER — POTASSIUM CHLORIDE CRYS ER 20 MEQ PO TBCR
40.0000 meq | EXTENDED_RELEASE_TABLET | ORAL | Status: DC
  Filled 2014-02-06: qty 2

## 2014-02-06 NOTE — Progress Notes (Signed)
Name: Summer Bradley MRN: [id number] DOB: Apr 28, 1945  ELECTRONIC ICU PHYSICIAN NOTE  Problem:  Refractory wheezing on lopressor 100 mg daily ? Spillover B2 effect  Intervention:  Try bisoprolol 2.5 mg bid   Christinia Gully 02/06/2014, 6:57 PM

## 2014-02-06 NOTE — Progress Notes (Signed)
UR completed. Therapy recommending CIR at d/c. Await formal consult.   Sandi Mariscal, RN BSN [place] CCM Trauma/Neuro ICU Case Manager [phone]

## 2014-02-06 NOTE — Progress Notes (Signed)
Physical Therapy Treatment Patient Details Name: Summer Bradley MRN: [id number] DOB: Jun 13, 1945 Today's Date: 02/06/2014    History of Present Illness 68 yo female admitted from [facility] with L side weakness, vision loss and s/p fall hitting L side of face. intubated 01/29/14; 12/14 R MCA clot retrieval with revascularixation. pt extubated 01/30/14 PMH: HTN, CVA, AFIB, COPD, abdominal surg.  pt lethargic and found to have extension, re-intubated on 12/18.      PT Comments    Pt very eager for mobility and somewhat impulsive.  Pt demos active mobility in all extremities and follows cues for mobility.  Pt with decreased proprioception and has difficulty finding and maintaining midline with balance, worse in sitting than standing.  Noted insurance had denied pt for CIR earlier during stay and may need to consider SNF at D/C unless insurance willing to reconsider CIR for D/C.  Will continue to follow.    Follow Up Recommendations  CIR     Equipment Recommendations   (TBD)    Recommendations for Other Services       Precautions / Restrictions Precautions Precautions: Fall Precaution Comments: pt extubated this am.   Restrictions Weight Bearing Restrictions: No    Mobility  Bed Mobility Overal bed mobility: Needs Assistance;+2 for physical assistance Bed Mobility: Supine to Sit     Supine to sit: Min assist;+2 for physical assistance;HOB elevated     General bed mobility comments: pt needs A for bringing trunk up to sitting and scooting hips to EOB.    Transfers Overall transfer level: Needs assistance Equipment used: 2 person hand held assist Transfers: Sit to/from [facility] Sit to Stand: Mod assist;+2 physical assistance Stand pivot transfers: Mod assist;+2 physical assistance       General transfer comment: cues for safety as pt impulsive.  cues for UE use and step-by-step through pivot to recliner.  pt with difficulty maintaining midline and tends to lean to  L side.    Ambulation/Gait                 Stairs            Wheelchair Mobility    Modified Rankin (Stroke Patients Only) Modified Rankin (Stroke Patients Only) Pre-Morbid Rankin Score: Moderate disability Modified Rankin: Severe disability     Balance Overall balance assessment: Needs assistance Sitting-balance support: Bilateral upper extremity supported;Feet supported Sitting balance-Leahy Scale: Poor Sitting balance - Comments: pt with L lateral lean and has difficulty maintaining midline.  pt with decreased proprioception and needs visual input to A with finding midline.   Postural control: Left lateral lean                          Cognition Arousal/Alertness: Awake/alert Behavior During Therapy: Impulsive Overall Cognitive Status: Impaired/Different from baseline Area of Impairment: Attention;Following commands;Safety/judgement;Awareness;Problem solving   Current Attention Level: Selective   Following Commands: Follows one step commands consistently;Follows multi-step commands inconsistently Safety/Judgement: Decreased awareness of safety;Decreased awareness of deficits Awareness: Emergent Problem Solving: Slow processing;Difficulty sequencing;Requires verbal cues;Requires tactile cues General Comments: pt oriented and following directions, but impulsive.  pt tends to keep eyes closed, but can open on command and remains opened while mobilizing.      Exercises      General Comments        Pertinent Vitals/Pain Pain Assessment: No/denies pain    Home Living  Prior Function            PT Goals (current goals can now be found in the care plan section) Acute Rehab PT Goals Patient Stated Goal: Back to normal.   PT Goal Formulation: With patient/family Time For Goal Achievement: 02/19/14 Potential to Achieve Goals: Good Progress towards PT goals: Progressing toward goals    Frequency  Min 4X/week     PT Plan Current plan remains appropriate    Co-evaluation             End of Session Equipment Utilized During Treatment: Gait belt;Oxygen Activity Tolerance: Patient tolerated treatment well Patient left: in chair;with call bell/phone within reach;with family/visitor present     Time: 2244-9753 PT Time Calculation (min) (ACUTE ONLY): 26 min  Charges:  $Therapeutic Activity: 23-37 mins                    G CodesCatarina Hartshorn, [place] [phone] 02/06/2014, 1:40 PM

## 2014-02-06 NOTE — Progress Notes (Signed)
Found pt on cpap/ps 5/5,40. Changes made at 745 by Richardson Landry Minor per RN. Pt tol well. RT will cont to follow

## 2014-02-06 NOTE — Progress Notes (Signed)
PULMONARY / CRITICAL CARE MEDICINE   Name: Summer Bradley MRN: [id number] DOB: 24-Oct-1945    ADMISSION DATE:  01/29/2014 CONSULTATION DATE:  12/14  REFERRING MD : Neuro  CHIEF COMPLAINT: Left sided weakness  HISTORY OF PRESENT ILLNESS:   26 F with COPD, PAF admitted with acute CVA to Stroke Service after clot retrieval from R MCA  STUDIES/SIGNIFICANT EVENTS: 12/14 CTA head: Occlusive thrombus at the right M1-M2 junction. Subjectively good pial/pial collateral circulation. Perfusion findings suggest moderate acute infarct in the central right MCA territory with rim of penumbra, as above. Remote infarcts at the anterior and posterior margins of the right MCA territory. Thin posterior falx subdural hemorrhage  12/14 admitted to stroke  service after Neuro IR clot retrieval from R MCA. Intubated for procedure. PCCM consultation 12/14 CT head: Acute infarct right MCA territory as noted on CT perfusion study. There is interval development of high density in the right  temporoparietal cortex which may be due to contrast material or hemorrhage.  12/14 Echocardiogram: Lvef 50-55% 12/15 Ct head: Mild cytotoxic edema suspected in that same right MCA territory. Underlying chronic ischemic changes. Stable parafalcine subdural hematoma. No new intracranial hemorrhage or acute mass effect. No new intracranial abnormality 12/15 Extubated 12/16 Transferred out of ICU. PCCM signed off 12/16 MRI brain: Acute nonhemorrhagic infarct involving the superior right temporal lobe, right insular ribbon, right frontal operculum, and posterior right frontal lobe. More remote infarcts are again noted in the more anterior inferior right frontal lobe and the right parietal lobe. Extensive white matter changes bilaterally likely reflect the sequela of chronic microvascular ischemia, advanced for age. White matter changes extend into the brainstem. Remote tender infarcts of the cerebellum bilaterally, worse on the  right. 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 Early edema suspected in the thalami right greater than left  12/17 Repeat cerebral angiogram by Neuro IR with clot retrieval. Remained intubated after procedure 12/17 CT head: Large RIGHT MCA territory infarct extending to RIGHT basal ganglia. Underlying atrophy and small vessel chronic ischemic changes. Stable parafalcine subdural hematoma. No new intracranial abnormalities  12/18 failed SBT due to tachypnea and low Vt. Wheezing noted 12/18 MRI brain: New, acute infarcts in the posterior circulation related to treated basilar thrombosis. Infarcts present in the bilateral cerebellum, upper right brainstem, mesial right temporal lobe, and minimally along the right occipital cortex 12/22 attempt extubation.      SUBJECTIVE:  afebrile Tolerates 5/5, good Tv   VITAL SIGNS: Temp:  [97.8 F (36.6 C)-99.2 F (37.3 C)] 98.5 F (36.9 C) (12/22 0355) Pulse Rate:  [47-100] 60 (12/22 0600) Resp:  [18-37] 24 (12/22 0600) BP: (104-140)/(60-89) 107/67 mmHg (12/22 0600) SpO2:  [91 %-99 %] 94 % (12/22 0600) FiO2 (%):  [40 %] 40 % (12/22 0319) HEMODYNAMICS: VENTILATOR SETTINGS: Vent Mode:  [-] PRVC FiO2 (%):  [40 %] 40 % Set Rate:  [14 bmp] 14 bmp Vt Set:  [400 mL] 400 mL PEEP:  [5 cmH20] 5 cmH20 Pressure Support:  [8 cmH20-10 cmH20] 8 cmH20 Plateau Pressure:  [11 cmH20-17 cmH20] 11 cmH20 INTAKE / OUTPUT:  Intake/Output Summary (Last 24 hours) at 02/06/14 0749 Last data filed at 02/06/14 0600  Gross per 24 hour  Intake   1205 ml  Output   3385 ml  Net  -2180 ml    PHYSICAL EXAMINATION: General:  RASS 0, + F/C Neuro: awake, follows commands, RASS 0, LUE weakness 1/5, very alert, wants et out! HEENT:  abrasion to L eye unchanged Cardiovascular:  IRIR, tachy 113, no M Lungs: decreased BS , no wheeze Abdomen:  Obese, large midline scar Musculoskeletal:  intact Skin:  Warm , no lower ext  edema  LABS:  Recent Labs Lab 02/04/14 0220 02/05/14 0220 02/06/14 0500  NA 145 145 144  K 3.7 3.8 3.1*  CL 106 104 100  CO2 30 31 29   BUN 23 30* 31*  CREATININE 0.57 0.55 0.76  GLUCOSE 123* 154* 145*    Recent Labs Lab 02/04/14 0220 02/05/14 0220 02/06/14 0500  HGB 8.6* 8.7* 9.0*  HCT 29.1* 29.6* 30.7*  WBC 8.7 10.4 12.0*  PLT 339 404* 504*    Dg Chest Port 1 View  02/06/2014   CLINICAL DATA:  Hypoxia  EXAM: PORTABLE CHEST - 1 VIEW  COMPARISON:  February 05, 2014  FINDINGS: Endotracheal tube tip is 2.8 cm above the carina. Nasogastric tube tip is in the midesophagus. Central catheter tip is in the superior vena cava. No pneumothorax. There is persistent interstitial edema with underlying emphysema. Heart is enlarged. The pulmonary vascularity reflects underlying emphysema. No adenopathy.  IMPRESSION: Tube and catheter positions as described without pneumothorax. Note that the nasogastric tube tip is in the mid esophageal region. Nasogastric tube tip needs advancing approximately 20 cm to confirm that tip and side port are below the diaphragm.  There is evidence of a degree of congestive heart failure with underlying emphysema. No airspace consolidation.  Critical Value/emergent results were called by telephone at the time of interpretation on 02/06/2014 at 7:40 am to Rich Fuchs, RN, who verbally acknowledged these results.   Electronically Signed   By: Lowella Grip M.D.   On: 02/06/2014 07:40   Dg Chest Port 1 View  02/05/2014   CLINICAL DATA:  Acute respiratory failure.  EXAM: PORTABLE CHEST - 1 VIEW  COMPARISON:  02/04/2014.  FINDINGS: Endotracheal tube, right IJ line, NG tube in stable position. Cardiomegaly. Progressive bilateral pulmonary interstitial prominence consistent with developing congestive heart failure with pulmonary interstitial edema. No pneumothorax. No acute osseous abnormality .  IMPRESSION: 1. Lines and tubes in stable position. 2. Congestive heart  failure with progressive bilateral pulmonary interstitial edema.   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 ICU associated discomfort  P:   RASS goal: 0 Cont PRN fentanyl Stroke mgmt per Stroke team   PULMONARY OETT 12/14>>12/15, 12/17 >> A: VDRF   COPD with bronchospasm Suspect mild pulm edema P:    Daily SBT -extubate Cont BDs Solumedrol 40 q 24h Diurese as tolerated(neg 2100 x 24 hrs)  CARDIOVASCULAR RUE PICC 12/18 >>  A:  Chronic HTN PAF with RVR>>better on cardiazem Sedation related hypotension-resolved Echo EF 50% P:  SBP goal 120-130 mmHg Cont  Cardizem oral Cont PRN metoprolol Ct apixaban Lasix 40 q 12h  RENAL A:  No acute issue P:   Monitor BMET intermittently Monitor I/Os Correct electrolytes as indicated   GASTROINTESTINAL A:   No issues P:   SUP: enteral pantoprazole -TFs on hold for ? extubation  HEMATOLOGIC A:   ICU acquired anemia without overt bleeding   P:  DVT px: SCDs Monitor CBC intermittently Transfuse per usual ICU guidelines  INFECTIOUS A:  No overt infectious process P:   ABX:    Rocephin 12/20>>>  Cultures:  Resp cult 12/20>>>moraxella  ENDOCRINE CBG (last 3)   Recent Labs  02/05/14 0805 02/05/14 1631 02/05/14 2356  GLUCAP 146* 174* 169*     A:   Hyperglycemia d/t steroids P:   Cont SSI  FAMILY  - Updates: daughter Jackelyn Poling updated @ bedside 12/21    Richardson Landry Minor ACNP Maryanna Shape PCCM Pager [phone] till 3 pm If no answer page [phone]  Summary - Proceed with extubation -  then swallow evaluation if does well  Care during the described time interval was provided by me and/or other providers on the critical care team.  I have reviewed this patient's available data, including medical history, events of note, physical examination and test results as part of my evaluation  CC time x 43m  Toshie Demelo V. MD 02/06/2014, 7:51 AM

## 2014-02-06 NOTE — Progress Notes (Signed)
Pt refuses to finish neb tx at this time. Also, pt refuses to do IS. Pt did deep breath and cough and had a NPC.  Pt is requesting son and daughter. RN aware.

## 2014-02-06 NOTE — Evaluation (Signed)
Clinical/Bedside Swallow Evaluation Patient Details  Name: Summer Bradley MRN: [id number] Date of Birth: August 26, 1945  Today's Date: 02/06/2014 Time: 9024-0973 SLP Time Calculation (min) (ACUTE ONLY): 11 min  Past Medical History:  Past Medical History  Diagnosis Date  . Hypertension   . Stroke   . A-fib   . COPD (chronic obstructive pulmonary disease)    Past Surgical History:  Past Surgical History  Procedure Laterality Date  . Abdominal surgery    . Radiology with anesthesia N/A 01/29/2014    Procedure: RADIOLOGY WITH ANESTHESIA;  Surgeon: Luanne Bras, MD;  Location: [facility];  Service: Radiology;  Laterality: N/A;  . Radiology with anesthesia N/A 02/01/2014    Procedure: RADIOLOGY WITH ANESTHESIA;  Surgeon: Rob Hickman, MD;  Location: [facility];  Service: Radiology;  Laterality: N/A;   HPI:  34 yof developed neuro changes 01/29/14 with left side weakness that resulted in a fall. Transported via EMS to [facility] where she was taken to IR and rt mca clot removal and revascularization procedure was performed. 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 Functionally, these deficits manifested as left buccal residue, reduced oral manipulation and control, anterior spillage, and wet phonation and cough associated with ice chips/thin liquids.  Pt advanced to dysphagia 2, thin liquids on 12/16.  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  Reintubated 12/17-12/22.  Revascularization 12/17.  12/18 MRI reveals new multi focal infarcts in the posterior circulation, affecting the mesial right temporal lobe (hippocampus included), right occipital cortex (small volume), bilateral mid and upper cerebellum (individual infarcts measure 15 mm or less), and right   Assessment / Plan / Recommendation Clinical Impression  Pt presents with significantly worsened  swallow function s/p posterior circulation CVA and five-day intubation - voice with aphonia; left CN involvement of V, VII, X, XII has deteriorated.  Clinical manifestations of dysphagia are notable with explosive, immediate coughing s/p limited ice chips/water, poor oral retention.  Pt is impulsive with limited awareness.  Recommend NPO with FEES next date.  Pt is a severe aspiration risk at this time.      Aspiration Risk  Severe    Diet Recommendation NPO        Other  Recommendations Recommended Consults: FEES next date with goals pending      Swallow Study Prior Functional Status       General Date of Onset: 01/29/14 HPI: 69 yof developed neuro changes 01/29/14 with left side weakness that resulted in a fall. Transported via EMS to [facility] where she was taken to IR and rt mca clot removal and revascularization procedure was performed. 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 Functionally, these deficits manifested as left buccal residue, reduced oral manipulation and control, anterior spillage, and wet phonation and cough associated with ice chips/thin liquids.  Pt advanced to dysphagia 2, thin liquids on 12/16.  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  Reintubated 12/17-12/22.  Revascularization 12/17.  12/18 MRI reveals new multi focal infarcts in the posterior circulation, affecting the mesial right temporal lobe (hippocampus included), right occipital cortex (small volume), bilateral mid and upper cerebellum (individual infarcts measure 15 mm or less), and right Type of Study: Bedside swallow evaluation Previous Swallow Assessment: 01/30/14 Diet Prior to this Study: NPO Temperature Spikes Noted: No Respiratory Status: Nasal  cannula History of Recent Intubation: Yes Length of Intubations (days):  (5) Date extubated:  02/06/14 Behavior/Cognition: Alert;Confused;Impulsive Oral Cavity - Dentition: Edentulous Self-Feeding Abilities: Needs assist Patient Positioning: Upright in chair Baseline Vocal Quality: Aphonic Volitional Cough: Weak Volitional Swallow: Able to elicit    Oral/Motor/Sensory Function Overall Oral Motor/Sensory Function:  (worsening impairment CV V,VII,X,XII)   Ice Chips Ice chips: Impaired Presentation: Spoon Oral Phase Impairments: Reduced labial seal;Reduced lingual movement/coordination;Impaired anterior to posterior transit Oral Phase Functional Implications: Left anterior spillage;Left lateral sulci pocketing;Prolonged oral transit Pharyngeal Phase Impairments: Suspected delayed Swallow;Decreased hyoid-laryngeal movement;Cough - Immediate   Thin Liquid Thin Liquid: Impaired Presentation: Spoon Oral Phase Impairments: Reduced labial seal;Reduced lingual movement/coordination Oral Phase Functional Implications: Left anterior spillage;Prolonged oral transit Pharyngeal  Phase Impairments: Suspected delayed Swallow;Decreased hyoid-laryngeal movement;Multiple swallows;Cough - Immediate    Nectar Thick Nectar Thick Liquid: Not tested   Honey Thick Honey Thick Liquid: Not tested   Puree Puree: Not tested   Solid Summer Soltero L. [place], [place] CCC/SLP Pager [phone]     Solid: Not tested       Summer Bradley 02/06/2014,2:30 PM

## 2014-02-06 NOTE — Progress Notes (Addendum)
STROKE TEAM PROGRESS NOTE   HISTORY Summer Bradley is a 68 y.o. female who got up around 4:30 AM 01/29/2014 to go to the bathroom and then sat down in her chair. She is not certain if her left arm was working at that time or not. She then fell out of her chair around 5 AM and that time noticed that her left arm wasn't working. She did not realize that anything was wrong until she fell out of her chair.  She was in her normal state of health ongoing to bed the evening prior. She was last known well at 8p on 01/28/2014.  In the ER, a CT was obtained which shows evidence of a previous stroke, and a CT perfusion scan was obtained which shows a significant mismatch demonstrating an area of penumbra, though there is an smaller area of likely infarct core as well.  Patient was not administered TPA secondary to being  outside of the tPA window with question of subdural hematoma on CT. Due to mismatch, she was taken to neuro intervention where she had complete revascularization of RT MCA M1 occlusion using 2 passes with the Solitaire and 10 mg of IA Integrelin. TICI 3 flow reesablished. Afterwards, she was admitted to the neuro ICU for further evaluation and treatment.   SUBJECTIVE (INTERVAL HISTORY) The patient has been able to follow every simple commands. Started eliquis yesterday without side effect. Able to tolerate weaning trials and was extubated in the am.  OBJECTIVE Temp:  [98.4 F (36.9 C)-99.2 F (37.3 C)] 98.6 F (37 C) (12/22 1600) Pulse Rate:  [47-132] 112 (12/22 1700) Cardiac Rhythm:  [-] Normal sinus rhythm (12/22 1200) Resp:  [18-32] 27 (12/22 1700) BP: (104-140)/(60-89) 110/80 mmHg (12/22 1700) SpO2:  [91 %-100 %] 100 % (12/22 1700) FiO2 (%):  [40 %] 40 % (12/22 0856)   Recent Labs Lab 02/04/14 1605 02/04/14 2322 02/05/14 0805 02/05/14 1631 02/05/14 2356  GLUCAP 156* 172* 146* 174* 169*    Recent Labs Lab 02/02/14 0245 02/03/14 0540 02/04/14 0220 02/05/14 0220  02/06/14 0500  NA 145 143 145 145 144  K 3.7 3.6* 3.7 3.8 3.1*  CL 109 108 106 104 100  CO2 25 26 30 31 29   GLUCOSE 92 128* 123* 154* 145*  BUN 13 18 23  30* 31*  CREATININE 0.61 0.57 0.57 0.55 0.76  CALCIUM 7.9* 8.1* 8.4 9.3 8.9   No results for input(s): AST, ALT, ALKPHOS, BILITOT, PROT, ALBUMIN in the last 168 hours.  Recent Labs Lab 02/01/14 1039 02/02/14 0245 02/03/14 0540 02/04/14 0220 02/05/14 0220 02/06/14 0500  WBC 11.9* 12.3* 10.6* 8.7 10.4 12.0*  NEUTROABS 9.7* 10.1*  --   --   --   --   HGB 8.1* 8.4* 8.3* 8.6* 8.7* 9.0*  HCT 27.4* 29.0* 27.6* 29.1* 29.6* 30.7*  MCV 83.3 83.1 82.6 84.8 82.2 81.6  PLT 234 228 280 339 404* 504*   No results for input(s): CKTOTAL, CKMB, CKMBINDEX, TROPONINI in the last 168 hours. No results for input(s): LABPROT, INR in the last 72 hours. No results for input(s): COLORURINE, LABSPEC, [place], GLUCOSEU, HGBUR, BILIRUBINUR, KETONESUR, PROTEINUR, UROBILINOGEN, NITRITE, LEUKOCYTESUR in the last 72 hours.  Invalid input(s): APPERANCEUR     Component Value Date/Time   CHOL 114 01/30/2014 0337   TRIG 102 01/30/2014 0337   HDL 24* 01/30/2014 0337   CHOLHDL 4.8 01/30/2014 0337   VLDL 20 01/30/2014 0337   LDLCALC 70 01/30/2014 0337   Lab Results  Component Value  Date   HGBA1C 6.4* 01/30/2014      Component Value Date/Time   LABOPIA NONE DETECTED 01/29/2014 0659   COCAINSCRNUR NONE DETECTED 01/29/2014 0659   LABBENZ NONE DETECTED 01/29/2014 0659   AMPHETMU NONE DETECTED 01/29/2014 0659   THCU NONE DETECTED 01/29/2014 0659   LABBARB NONE DETECTED 01/29/2014 0659    No results for input(s): ETH in the last 168 hours.  Ct Head Wo Contrast  01/30/2014    1. Resolved right MCA territory hyperdensity present at 1056 hrs yesterday, most likely was post Neurointervention parenchymal contrast staining. 2. Mild cytotoxic edema suspected in that same right MCA territory. Underlying chronic ischemic changes. 3. Stable parafalcine subdural  hematoma. No new intracranial hemorrhage or acute mass effect. 4. No new intracranial abnormality.    01/29/2014    Acute infarct right MCA territory as noted on CT perfusion study. There is interval development of high density in the right temporoparietal cortex which may be due to contrast material or hemorrhage. Continued follow-up recommended.  Small interhemispheric subdural hematoma is unchanged.    01/29/2014   1. 4 mm thick posterior falx subdural hemorrhage. 2. Prominent density at the right M1/M2 junction. CTA is already planned. 3. Established infarcts in the anterior and posterior right MCA territory. No definitive acute infarct. 4. Left frontal scalp contusion.  No acute fracture.   02/01/14: Stat CT scan shows no acute changes in the brain, but there appears to be a thrombus in the basilar artery that is new.   Ct Cerebral Perfusion W/cm 01/29/2014    1. Occlusive thrombus at the right M1-M2 junction. Subjectively good pial/pial collateral circulation. 2. Perfusion findings suggest moderate acute infarct in the central right MCA territory with rim of penumbra, as above. 3. Remote infarcts at the anterior and posterior margins of the right MCA territory. 4. Thin posterior falx subdural hemorrhage.     Cerebral Angiogram 01/29/2014 S/P Bilateral common carotid arteriograms ,followed by complete revascularization of RT MCA M1 occlusion usingX2 passes with the Solitaire FR 59mm x 8mm retrieval device And 10 mg of  superselective intracranial IA Integrelin. TICI 3 flow reesablished.   MRI brain 01/31/14: IMPRESSION: 1. Acute nonhemorrhagic infarct involving the superior right temporal lobe, right insular ribbon, right frontal operculum, and posterior right frontal lobe. 2. More remote infarcts are again noted in the more anterior inferior right frontal lobe and the right parietal lobe. 3. Extensive white matter changes bilaterally likely reflect the sequela of chronic microvascular  ischemia, advanced for age. 4. White matter changes extend into the brainstem. 5. Remote tender infarcts of the cerebellum bilaterally, worse on the right.  MRI brain 02/02/14: IMPRESSION: 1. New, acute infarcts in the posterior circulation related to treated basilar thrombosis. Infarcts present in the bilateral cerebellum, upper right brainstem, mesial right temporal lobe, and minimally along the right occipital cortex. Normal flow voids within the intracranial vessels. 2. Stable appearance of recent right MCA territory infarct. 3. Stable trace falcine subdural hemorrhage.  Dg Chest Port 1 View 01/30/2014    COPD with bibasilar scarring with superimposed subsegmental atelectasis. There is no evidence of CHF.    01/29/2014    Bilateral perihilar/infrahilar opacification likely interstitial edema and less likely infection.  Endotracheal tube with tip 5.2 cm above the carina.      2D Echocardiogram  EF 50-55% with no source of embolus.   A1C 6.4 adn LDL 70  Physical Exam  General: The patient is awake, following simple commands. The patient is moderately obese.  The patient has ecchymosis over the left brow.  Respiratory: Lung fields are clear  Cardiovascular: Occasional irregular heart rhythm, no murmurs noted  Abdomen: Soft  Skin: No significant peripheral edema is noted.  Neurologic Exam  Mental status: The patient is cooperative, follows simple commands. Extubated today.   Cranial nerves: Pupils are equal, reactive to light. Eye able to move bilaterally but not fully to the lateral edge. There is blink to threat from both sides. Mild dysarthria.  Motor: The patient is able to move both legs fairly well, the left arm 3+/5, 2/5 distally. The patient has good control of the right arm.  Sensory examination: The patient has decreased sensation on the left arm, seems to have symmetric sensation on the legs.  Coordination: No obvious ataxia seen on the extremities. Difficult to  test.  Gait and station: Gait was not tested.  Reflexes: Deep tendon reflexes are symmetric.  ASSESSMENT/PLAN Ms. Summer Bradley is a 68 y.o. female with history of hypertension, stroke, afib and COPD presenting with Left-sided weakness. She did not receive IV t-PA due to delay in arrival, ? SDH. But underwent mechanical thrombectomy of right MCA with complete vascularization of right MCA M1 occlusion with IA Integrilin. 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 Dr. Estanislado Pandy was able to extract the clot, and the patient has sustained a right pontine and midbrain stroke, small bilateral cerebellar strokes, and some extension of the infarct in the mesial temporal area on the right.   Stroke:  Suspect right MCA infarct and brainstem status post mechanical thrombectomy x 2. Etiology is consistent with Afib with subtherapeutic INR on coumadin  2D Echo  EF 50-55% no source of embolus  HgbA1c 6.4  eliquis po for VTE prophylaxis  Resultant left arm weakness and left facial droop.   aspirin 81 mg orally every day and warfarin prior to admission, now on eliquis for stroke prevention  Neurologically stable   PT/OT on board  Transfer to floor tomorrow  Acute Respiratory Failure  Intubated for neuro intervention-extubated today   High risk of respiratory decline  Remain in ICU for 24 hours post extubation.  Atrial Fibrillation  Home meds:  Warfarin  INR subtherapeutic on admission, INR 1.18   started Cardizem and s/p d/c amiodarone.   On eliquis since 02/05/14.    Hypertension  Continue to monitor  BP stable 120-140  Hyperlipidemia  Home meds:  Mevacor  LDL 70, goal < 70  Reason Mevacor once able to swallow  Dysphagia - did not pass swallow today - wait for MBS tomorrow - IVF @35cc /h  Other Stroke Risk Factors  Advanced age  Obesity, Body mass index is 36.28 kg/(m^2).   Hx stroke/TIA  Other Active  Problems  COPD  Depression, on chronic SSRI  Hospital day # 8   This patient is critically ill due to multifocal embolic infarcts and at significant risk of neurological worsening, death form further embolic strokes, bleeding from anticoagulation. This patient's care requires constant monitoring of vital signs, hemodynamics, respiratory and cardiac monitoring, review of multiple databases, neurological assessment, discussion with family, other specialists and medical decision making of high complexity. I reviewed her images in person and discussed extensively with family at bedside. I spent 35 minutes of neurocritical care time in the care of this patient.  Rosalin Hawking, MD PhD Stroke Neurology 02/06/2014 6:00 PM   To contact Stroke Continuity provider, please refer to [email or web address] After hours, contact General Neurology

## 2014-02-06 NOTE — Progress Notes (Signed)
Per DR Elsworth Soho ok to extubate. Extubated pt to 4L [place]. Pt tol well. Pt in no distress

## 2014-02-06 NOTE — Procedures (Signed)
Extubation Procedure Note  Patient Details:   Name: Summer Bradley DOB: 02-16-1946 MRN: [id number]   Airway Documentation:     Evaluation  O2 sats: 93 Complications: No apparent complications Patient did tolerate procedure well. Bilateral Breath Sounds: Clear, Diminished Suctioning: Oral, Airway Yes. Deep oral sxn done. Pt has positive cuff leak. Per Dr Elsworth Soho extubated pt to 4L [name]. No stridor. Placed pt on 4L [place]. Pt ol well. Pt in no distress.  Constance Holster 02/06/2014, 10:28 AM

## 2014-02-07 ENCOUNTER — Inpatient Hospital Stay (HOSPITAL_COMMUNITY): Payer: Medicare HMO

## 2014-02-07 DIAGNOSIS — J00 Acute nasopharyngitis [common cold]: Secondary | ICD-10-CM

## 2014-02-07 DIAGNOSIS — R1314 Dysphagia, pharyngoesophageal phase: Secondary | ICD-10-CM | POA: Insufficient documentation

## 2014-02-07 LAB — MAGNESIUM: Magnesium: 2.1 mg/dL (ref 1.5–2.5)

## 2014-02-07 LAB — BASIC METABOLIC PANEL
Anion gap: 10 (ref 5–15)
BUN: 31 mg/dL — ABNORMAL HIGH (ref 6–23)
CO2: 32 mmol/L (ref 19–32)
Calcium: 8.7 mg/dL (ref 8.4–10.5)
Chloride: 103 mEq/L (ref 96–112)
Creatinine, Ser: 0.74 mg/dL (ref 0.50–1.10)
GFR calc Af Amer: >90 mL/min (ref >90)
GFR calc non Af Amer: 85 mL/min — ABNORMAL LOW (ref >90)
Glucose, Bld: 119 mg/dL — ABNORMAL HIGH (ref 70–99)
Potassium: 3.5 mmol/L (ref 3.5–5.1)
Sodium: 145 mmol/L (ref 135–145)

## 2014-02-07 LAB — CBC
HCT: 30.8 % — ABNORMAL LOW (ref 36.0–46.0)
Hemoglobin: 8.8 g/dL — ABNORMAL LOW (ref 12.0–15.0)
MCH: 23.5 pg — ABNORMAL LOW (ref 26.0–34.0)
MCHC: 28.6 g/dL — ABNORMAL LOW (ref 30.0–36.0)
MCV: 82.4 fL (ref 78.0–100.0)
Platelets: 504 10*3/uL — ABNORMAL HIGH (ref 150–400)
RBC: 3.74 MIL/uL — ABNORMAL LOW (ref 3.87–5.11)
RDW: 19.5 % — ABNORMAL HIGH (ref 11.5–15.5)
WBC: 12.3 10*3/uL — ABNORMAL HIGH (ref 4.0–10.5)

## 2014-02-07 LAB — PHOSPHORUS: Phosphorus: 4 mg/dL (ref 2.3–4.6)

## 2014-02-07 LAB — GLUCOSE, CAPILLARY: Glucose-Capillary: 174 mg/dL — ABNORMAL HIGH (ref 70–99)

## 2014-02-07 MED ORDER — POTASSIUM CHLORIDE 10 MEQ/50ML IV SOLN
10.0000 meq | INTRAVENOUS | Status: AC
  Administered 2014-02-07 (×4): 10 meq via INTRAVENOUS
  Filled 2014-02-07 (×4): qty 50

## 2014-02-07 MED ORDER — FUROSEMIDE 10 MG/ML IJ SOLN
20.0000 mg | Freq: Three times a day (TID) | INTRAMUSCULAR | Status: AC
  Administered 2014-02-07 (×2): 20 mg via INTRAVENOUS
  Filled 2014-02-07: qty 4

## 2014-02-07 MED ORDER — APIXABAN 5 MG PO TABS
5.0000 mg | ORAL_TABLET | Freq: Two times a day (BID) | ORAL | Status: DC
  Administered 2014-02-07 – 2014-02-14 (×13): 5 mg via ORAL
  Filled 2014-02-07 (×18): qty 1

## 2014-02-07 MED ORDER — PANTOPRAZOLE SODIUM 40 MG PO TBEC
40.0000 mg | DELAYED_RELEASE_TABLET | Freq: Every day | ORAL | Status: DC
  Administered 2014-02-07 – 2014-02-09 (×3): 40 mg via ORAL
  Filled 2014-02-07 (×3): qty 1

## 2014-02-07 MED ORDER — FUROSEMIDE 10 MG/ML IJ SOLN
40.0000 mg | Freq: Every day | INTRAMUSCULAR | Status: DC
  Filled 2014-02-07: qty 4

## 2014-02-07 MED ORDER — RESOURCE THICKENUP CLEAR PO POWD
ORAL | Status: DC | PRN
  Filled 2014-02-07 (×3): qty 125

## 2014-02-07 MED ORDER — FUROSEMIDE 10 MG/ML IJ SOLN
INTRAMUSCULAR | Status: AC
  Filled 2014-02-07: qty 4

## 2014-02-07 MED ORDER — DILTIAZEM HCL 60 MG PO TABS
60.0000 mg | ORAL_TABLET | Freq: Four times a day (QID) | ORAL | Status: DC
  Administered 2014-02-07 – 2014-03-05 (×97): 60 mg via ORAL
  Filled 2014-02-07 (×18): qty 1
  Filled 2014-02-07: qty 2
  Filled 2014-02-07 (×40): qty 1
  Filled 2014-02-07: qty 2
  Filled 2014-02-07 (×51): qty 1

## 2014-02-07 MED ORDER — ENOXAPARIN SODIUM 100 MG/ML ~~LOC~~ SOLN
1.0000 mg/kg | Freq: Two times a day (BID) | SUBCUTANEOUS | Status: DC
  Administered 2014-02-07: 90 mg via SUBCUTANEOUS
  Filled 2014-02-07 (×3): qty 1

## 2014-02-07 MED ORDER — PANTOPRAZOLE SODIUM 40 MG IV SOLR
40.0000 mg | INTRAVENOUS | Status: DC
  Administered 2014-02-07: 40 mg via INTRAVENOUS
  Filled 2014-02-07: qty 40

## 2014-02-07 NOTE — Clinical Social Work Note (Signed)
Clinical Social Work Department BRIEF PSYCHOSOCIAL ASSESSMENT 02/07/2014  Patient:  Summer Bradley, Summer Bradley     Account Number:  [id number]     Admit date:  01/29/2014  Clinical Social Worker:  Myles Lipps  Date/Time:  02/07/2014 12:00 N  Referred by:  Physician  Date Referred:  02/07/2014 Referred for  SNF Placement   Other Referral:   Interview type:  Patient Other interview type:   Patient daughter and granddaughter at bedside    PSYCHOSOCIAL DATA Living Status:  ALONE Admitted from facility:   Level of care:   Primary support name:  Aurea Graff  [phone] Primary support relationship to patient:  CHILD, ADULT Degree of support available:   Strong    CURRENT CONCERNS Current Concerns  Post-Acute Placement   Other Concerns:    SOCIAL WORK ASSESSMENT / PLAN Clinical Social Worker met with patient and family to offer support and discuss patient needs at discharge.  Patient and patient family state that patient currently lives at home and is not safe to return home alone.  CSW spoke with patient and family regarding options of inpatient rehab vs. SNF.  Patient and family both agree that inpatient rehab would be the first choice but are open to SNF placement in [facility].  Patient and family aware that patient insurance may be a barrier to facility choice or inpatient rehab placement at time of discharge.    Patient to have a FEES today to determine swallowing and patient means for nutrition. Clinical Social Worker remains available for support and to assist in facilitating patient discharge needs once medically stable.   Assessment/plan status:  Psychosocial Support/Ongoing Assessment of Needs Other assessment/ plan:   Information/referral to community resources:   Clinical Social Worker contacted patient insurance provider who states that patient does still have [employer], however no longer holds [employer]  CSW to update inpatient rehab as needed.   Patient family provided with facility list    PATIENT'S/FAMILY'S RESPONSE TO PLAN OF CARE: Patient alert and oriented x3 sitting up in the chair. Patient with good family support and engaged in assessment process.  Patient family understanding that patient insurance may present a barrier for placement.  Patient family understanding of CSW role and very involved in patient care and decision making.  Patient and family expressed their appreciation for CSW involvement.

## 2014-02-07 NOTE — Clinical Documentation Improvement (Signed)
Please clarify conflicting documentation in regard to diagnosis of CHF.  There is evidence of a degree of congestive heart failure with underlying emphysema. Per Chest Xray on 02/06/2014.  Please clarify the following:  CHF Ruled In or Ruled Out  Acuity/ Type of CHF   Possible Clinical Conditions?  Chronic Systolic Congestive Heart Failure Chronic Diastolic Congestive Heart Failure Chronic Systolic & Diastolic Congestive Heart Failure Acute Systolic Congestive Heart Failure Acute Diastolic Congestive Heart Failure Acute Systolic & Diastolic Congestive Heart Failure Acute on Chronic Systolic Congestive Heart Failure Acute on Chronic Diastolic Congestive Heart Failure Acute on Chronic Systolic & Diastolic Congestive Heart Failure Other Condition________________________________________ Cannot Clinically Determine  Supporting Information:  Risk Factors: Signs & Symptoms: There is evidence of a degree of congestive heart failure with underlying emphysema.  Diagnostics: Chest [facility] 02/06/14 There is evidence of a degree of congestive heart failure with underlying emphysema  Chest Xray 02/05/14 Cardiomegaly. Progressive bilateral pulmonary interstitial prominence consistent with developing congestive heart failure with pulmonary interstitial edema  Congestive heart failure with progressive bilateral pulmonary interstitial edema  EKG 02/01/14 Atrial Flutter,  A-V block, and AFIB  Treatment:diltiazem (CARDIZEM) 1 mg/mL load via infusion 10 mg   eptifibatide (INTEGRILIN) injection  nitroGLYCERIN 100 MCG/ML intra-arterial injection  metoprolol (LOPRESSOR) injection 2.5-5 mg   Thank You, Heloise Beecham ,RN Clinical Documentation Specialist:  [facility]

## 2014-02-07 NOTE — Clinical Social Work Placement (Signed)
Clinical Social Work Department CLINICAL SOCIAL WORK PLACEMENT NOTE 02/07/2014  Patient:  Summer Bradley, Summer Bradley  Account Number:  [id number] Admit date:  01/29/2014  Clinical Social Worker:  Barbette Or, LCSW  Date/time:  02/07/2014 12:30 PM  Clinical Social Work is seeking post-discharge placement for this patient at the following level of care:   [place]   (*CSW will update this form in Epic as items are completed)   02/07/2014  Patient/family provided with [place] Department of Clinical Social Work's list of facilities offering this level of care within the geographic area requested by the patient (or if unable, by the patient's family).  02/07/2014  Patient/family informed of their freedom to choose among providers that offer the needed level of care, that participate in Medicare, Medicaid or managed care program needed by the patient, have an available bed and are willing to accept the patient.  02/07/2014  Patient/family informed of [facility]' ownership interest in [facility], as well as of the fact that they are under no obligation to receive care at this facility.  PASARR submitted to EDS on 02/07/2014 PASARR number received on 02/07/2014  FL2 transmitted to all facilities in geographic area requested by pt/family on  02/07/2014 FL2 transmitted to all facilities within larger geographic area on   Patient informed that his/her managed care company has contracts with or will negotiate with  certain facilities, including the following:     Patient/family informed of bed offers received:   Patient chooses bed at  Physician recommends and patient chooses bed at    Patient to be transferred to  on   Patient to be transferred to facility by  Patient and family notified of transfer on  Name of family member notified:    The following physician request were entered in Epic:   Additional Comments:

## 2014-02-07 NOTE — Progress Notes (Signed)
STROKE TEAM PROGRESS NOTE   HISTORY Summer Bradley is a 68 y.o. female who got up around 4:30 AM 01/29/2014 to go to the bathroom and then sat down in her chair. She is not certain if her left arm was working at that time or not. She then fell out of her chair around 5 AM and that time noticed that her left arm wasn't working. She did not realize that anything was wrong until she fell out of her chair.  She was in her normal state of health ongoing to bed the evening prior. She was last known well at 8p on 01/28/2014.  In the ER, a CT was obtained which shows evidence of a previous stroke, and a CT perfusion scan was obtained which shows a significant mismatch demonstrating an area of penumbra, though there is an smaller area of likely infarct core as well.  Patient was not administered TPA secondary to being  outside of the tPA window with question of subdural hematoma on CT. Due to mismatch, she was taken to neuro intervention where she had complete revascularization of RT MCA M1 occlusion using 2 passes with the Solitaire and 10 mg of IA Integrelin. TICI 3 flow reesablished. Afterwards, she was admitted to the neuro ICU for further evaluation and treatment.   SUBJECTIVE (INTERVAL HISTORY) The patient tolerated extubation for 24 hours. She needed breathing treatment without issues. She follows every simple commands, however did not pass swallow test yesterday so put on lovenox therapeutic dose. In the pm, she passed swallow so will re-start eliquis. Transfer to floor.  OBJECTIVE Temp:  [97.6 F (36.4 C)-98.7 F (37.1 C)] 97.8 F (36.6 C) (12/23 1600) Pulse Rate:  [67-152] 112 (12/23 1700) Cardiac Rhythm:  [-] Sinus tachycardia (12/23 1600) Resp:  [16-32] 32 (12/23 1700) BP: (55-154)/(23-107) 131/85 mmHg (12/23 1700) SpO2:  [90 %-99 %] 97 % (12/23 1700)   Recent Labs Lab 02/04/14 1605 02/04/14 2322 02/05/14 0805 02/05/14 1631 02/05/14 2356  GLUCAP 156* 172* 146* 174* 169*    Recent  Labs Lab 02/03/14 0540 02/04/14 0220 02/05/14 0220 02/06/14 0500 02/07/14 0425  NA 143 145 145 144 145  K 3.6* 3.7 3.8 3.1* 3.5  CL 108 106 104 100 103  CO2 26 30 31 29  32  GLUCOSE 128* 123* 154* 145* 119*  BUN 18 23 30* 31* 31*  CREATININE 0.57 0.57 0.55 0.76 0.74  CALCIUM 8.1* 8.4 9.3 8.9 8.7  MG  --   --   --   --  2.1  PHOS  --   --   --   --  4.0   No results for input(s): AST, ALT, ALKPHOS, BILITOT, PROT, ALBUMIN in the last 168 hours.  Recent Labs Lab 02/01/14 1039 02/02/14 0245 02/03/14 0540 02/04/14 0220 02/05/14 0220 02/06/14 0500 02/07/14 0425  WBC 11.9* 12.3* 10.6* 8.7 10.4 12.0* 12.3*  NEUTROABS 9.7* 10.1*  --   --   --   --   --   HGB 8.1* 8.4* 8.3* 8.6* 8.7* 9.0* 8.8*  HCT 27.4* 29.0* 27.6* 29.1* 29.6* 30.7* 30.8*  MCV 83.3 83.1 82.6 84.8 82.2 81.6 82.4  PLT 234 228 280 339 404* 504* 504*   No results for input(s): CKTOTAL, CKMB, CKMBINDEX, TROPONINI in the last 168 hours. No results for input(s): LABPROT, INR in the last 72 hours. No results for input(s): COLORURINE, LABSPEC, [place], GLUCOSEU, HGBUR, BILIRUBINUR, KETONESUR, PROTEINUR, UROBILINOGEN, NITRITE, LEUKOCYTESUR in the last 72 hours.  Invalid input(s): APPERANCEUR  Component Value Date/Time   CHOL 114 01/30/2014 0337   TRIG 102 01/30/2014 0337   HDL 24* 01/30/2014 0337   CHOLHDL 4.8 01/30/2014 0337   VLDL 20 01/30/2014 0337   LDLCALC 70 01/30/2014 0337   Lab Results  Component Value Date   HGBA1C 6.4* 01/30/2014      Component Value Date/Time   LABOPIA NONE DETECTED 01/29/2014 0659   COCAINSCRNUR NONE DETECTED 01/29/2014 0659   LABBENZ NONE DETECTED 01/29/2014 0659   AMPHETMU NONE DETECTED 01/29/2014 0659   THCU NONE DETECTED 01/29/2014 0659   LABBARB NONE DETECTED 01/29/2014 0659    No results for input(s): ETH in the last 168 hours.  I have personally reviewed the radiological images below and agree with the radiology interpretations.  Ct Head Wo Contrast  01/30/2014     1. Resolved right MCA territory hyperdensity present at 1056 hrs yesterday, most likely was post Neurointervention parenchymal contrast staining. 2. Mild cytotoxic edema suspected in that same right MCA territory. Underlying chronic ischemic changes. 3. Stable parafalcine subdural hematoma. No new intracranial hemorrhage or acute mass effect. 4. No new intracranial abnormality.    01/29/2014    Acute infarct right MCA territory as noted on CT perfusion study. There is interval development of high density in the right temporoparietal cortex which may be due to contrast material or hemorrhage. Continued follow-up recommended.  Small interhemispheric subdural hematoma is unchanged.    01/29/2014   1. 4 mm thick posterior falx subdural hemorrhage. 2. Prominent density at the right M1/M2 junction. CTA is already planned. 3. Established infarcts in the anterior and posterior right MCA territory. No definitive acute infarct. 4. Left frontal scalp contusion.  No acute fracture.   02/01/14: Stat CT scan shows no acute changes in the brain, but there appears to be a thrombus in the basilar artery that is new.   Ct Cerebral Perfusion W/cm 01/29/2014    1. Occlusive thrombus at the right M1-M2 junction. Subjectively good pial/pial collateral circulation. 2. Perfusion findings suggest moderate acute infarct in the central right MCA territory with rim of penumbra, as above. 3. Remote infarcts at the anterior and posterior margins of the right MCA territory. 4. Thin posterior falx subdural hemorrhage.     Cerebral Angiogram 01/29/2014 S/P Bilateral common carotid arteriograms ,followed by complete revascularization of RT MCA M1 occlusion usingX2 passes with the Solitaire FR 72mm x 58mm retrieval device And 10 mg of  superselective intracranial IA Integrelin. TICI 3 flow reesablished.   MRI brain 01/31/14: IMPRESSION: 1. Acute nonhemorrhagic infarct involving the superior right temporal lobe, right insular ribbon,  right frontal operculum, and posterior right frontal lobe. 2. More remote infarcts are again noted in the more anterior inferior right frontal lobe and the right parietal lobe. 3. Extensive white matter changes bilaterally likely reflect the sequela of chronic microvascular ischemia, advanced for age. 4. White matter changes extend into the brainstem. 5. Remote tender infarcts of the cerebellum bilaterally, worse on the right.  MRI brain 02/02/14: IMPRESSION: 1. New, acute infarcts in the posterior circulation related to treated basilar thrombosis. Infarcts present in the bilateral cerebellum, upper right brainstem, mesial right temporal lobe, and minimally along the right occipital cortex. Normal flow voids within the intracranial vessels. 2. Stable appearance of recent right MCA territory infarct. 3. Stable trace falcine subdural hemorrhage.  Dg Chest Port 1 View 01/30/2014    COPD with bibasilar scarring with superimposed subsegmental atelectasis. There is no evidence of CHF.    01/29/2014  Bilateral perihilar/infrahilar opacification likely interstitial edema and less likely infection.  Endotracheal tube with tip 5.2 cm above the carina.      2D Echocardiogram  EF 50-55% with no source of embolus.   A1C 6.4 adn LDL 70  Physical Exam  General: The patient is awake, following simple commands. The patient is moderately obese. The patient has ecchymosis over the left brow.  Respiratory: Lung fields are clear  Cardiovascular: Occasional irregular heart rhythm, no murmurs noted  Abdomen: Soft  Skin: No significant peripheral edema is noted.  Neurologic Exam  Mental status: The patient is cooperative, follows simple commands. Extubated today.   Cranial nerves: Pupils are equal, reactive to light. Eye able to move bilaterally but not fully to the lateral edge. There is blink to threat from both sides. Mild dysarthria.  Motor: The left arm 3+/5, 3/5 distally, LLE 5/5, RUE  and RLE 5/5.   Sensory examination: The patient has decreased sensation on the left arm, seems to have symmetric sensation on the legs.  Coordination: No obvious ataxia seen on the extremities. Difficult to test.  Gait and station: Gait was not tested.  Reflexes: Deep tendon reflexes are symmetric.  ASSESSMENT/PLAN Ms. Quincee Gittens is a 68 y.o. female with history of hypertension, stroke, afib and COPD presenting with Left-sided weakness. She did not receive IV t-PA due to delay in arrival, ? SDH. But underwent mechanical thrombectomy of right MCA with complete vascularization of right MCA M1 occlusion with IA Integrilin. 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 Dr. Estanislado Pandy was able to extract the clot, and the patient has sustained a right pontine and midbrain stroke, small bilateral cerebellar strokes, and some extension of the infarct in the mesial temporal area on the right.   Stroke:  Suspect right MCA infarct and brainstem status post mechanical thrombectomy x 2. Etiology is consistent with Afib with subtherapeutic INR on coumadin  2D Echo  EF 50-55% no source of embolus  HgbA1c 6.4  eliquis po for VTE prophylaxis  Resultant left arm weakness and left facial droop.   aspirin 81 mg orally every day and warfarin prior to admission, now on eliquis for stroke prevention  Neurologically stable   PT/OT on board  Transfer to floor today  Acute Respiratory Failure  Intubated for neuro intervention-extubated yesterday   High risk of respiratory decline  Tolerated well 24 hours post extubation.  Continue nebulization and steroids for now.  D/C rocephine  Atrial Fibrillation  Home meds:  Warfarin  INR subtherapeutic on admission, INR 1.18   started Cardizem and s/p d/c amiodarone.   On eliquis.    Hypertension  Continue to monitor  BP stable 120-140  On bisoprolol  Hyperlipidemia  Home meds:  Mevacor  LDL 70,  goal < 70  On pravastatin  Continue statin on d/c  Dysphagia - passed swallow today - on dysphagia diet - d/c IVF  Other Stroke Risk Factors  Advanced age  Obesity, Body mass index is 36.28 kg/(m^2).   Hx stroke/TIA  Other Active Problems  COPD  Depression, on chronic SSRI  Hospital day # 9   Rosalin Hawking, MD PhD Stroke Neurology 02/07/2014 5:53 PM   To contact Stroke Continuity provider, please refer to [email or web address] After hours, contact General Neurology

## 2014-02-07 NOTE — Progress Notes (Signed)
PULMONARY / CRITICAL CARE MEDICINE   Name: Summer Bradley MRN: [id number] DOB: [birth year]    ADMISSION DATE:  01/29/2014 CONSULTATION DATE:  12/14  REFERRING MD : Neuro  CHIEF COMPLAINT: Left sided weakness  HISTORY OF PRESENT ILLNESS:   27 F with COPD, PAF admitted with acute CVA to Stroke Service after clot retrieval from R MCA  STUDIES/SIGNIFICANT EVENTS: 12/14 CTA head: Occlusive thrombus at the right M1-M2 junction. Subjectively good pial/pial collateral circulation. Perfusion findings suggest moderate acute infarct in the central right MCA territory with rim of penumbra, as above. Remote infarcts at the anterior and posterior margins of the right MCA territory. Thin posterior falx subdural hemorrhage  12/14 admitted to stroke  service after Neuro IR clot retrieval from R MCA. Intubated for procedure. PCCM consultation 12/14 CT head: Acute infarct right MCA territory as noted on CT perfusion study. There is interval development of high density in the right  temporoparietal cortex which may be due to contrast material or hemorrhage.  12/14 Echocardiogram: Lvef 50-55% 12/15 Ct head: Mild cytotoxic edema suspected in that same right MCA territory. Underlying chronic ischemic changes. Stable parafalcine subdural hematoma. No new intracranial hemorrhage or acute mass effect. No new intracranial abnormality 12/15 Extubated 12/16 Transferred out of ICU. PCCM signed off 12/16 MRI brain: Acute nonhemorrhagic infarct involving the superior right temporal lobe, right insular ribbon, right frontal operculum, and posterior right frontal lobe. More remote infarcts are again noted in the more anterior inferior right frontal lobe and the right parietal lobe. Extensive white matter changes bilaterally likely reflect the sequela of chronic microvascular ischemia, advanced for age. White matter changes extend into the brainstem. Remote tender infarcts of the cerebellum bilaterally, worse on the  right. 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 Early edema suspected in the thalami right greater than left  12/17 Repeat cerebral angiogram by Neuro IR with clot retrieval. Remained intubated after procedure 12/17 CT head: Large RIGHT MCA territory infarct extending to RIGHT basal ganglia. Underlying atrophy and small vessel chronic ischemic changes. Stable parafalcine subdural hematoma. No new intracranial abnormalities  12/18 failed SBT due to tachypnea and low Vt. Wheezing noted 12/18 MRI brain: New, acute infarcts in the posterior circulation related to treated basilar thrombosis. Infarcts present in the bilateral cerebellum, upper right brainstem, mesial right temporal lobe, and minimally along the right occipital cortex 12/22  extubation.    12/22 failed swallow eval. 12/23 FEES planned  SUBJECTIVE:  Awake and interactive    VITAL SIGNS: Temp:  [97.6 F (36.4 C)-98.7 F (37.1 C)] 97.6 F (36.4 C) (12/23 0000) Pulse Rate:  [48-132] 90 (12/23 0719) Resp:  [19-32] 31 (12/23 0719) BP: (55-150)/(23-103) 150/91 mmHg (12/23 0700) SpO2:  [90 %-100 %] 97 % (12/23 0719) FiO2 (%):  [40 %] 40 % (12/22 0856) HEMODYNAMICS: VENTILATOR SETTINGS: Vent Mode:  [-] PSV;CPAP FiO2 (%):  [40 %] 40 % PEEP:  [5 cmH20] 5 cmH20 Pressure Support:  [5 cmH20] 5 cmH20 INTAKE / OUTPUT:  Intake/Output Summary (Last 24 hours) at 02/07/14 0753 Last data filed at 02/07/14 0700  Gross per 24 hour  Intake 645.75 ml  Output   2460 ml  Net -1814.25 ml    PHYSICAL EXAMINATION: General:  RASS 0, + F/C, alert, follows commands. Neuro: awake, follows commands, RASS 0, LUE weakness 1/5, very alert,speech clear, left facial droop HEENT: abrasion to L eye unchanged Cardiovascular:  IRIR, tachy 113, no M Lungs: decreased BS , no  wheeze Abdomen:  Obese, large midline scar Musculoskeletal:  intact Skin:  Warm , no lower ext edema  LABS:  Recent Labs Lab  02/05/14 0220 02/06/14 0500 02/07/14 0425  NA 145 144 145  K 3.8 3.1* 3.5  CL 104 100 103  CO2 31 29 32  BUN 30* 31* 31*  CREATININE 0.55 0.76 0.74  GLUCOSE 154* 145* 119*    Recent Labs Lab 02/05/14 0220 02/06/14 0500 02/07/14 0425  HGB 8.7* 9.0* 8.8*  HCT 29.6* 30.7* 30.8*  WBC 10.4 12.0* 12.3*  PLT 404* 504* 504*    Dg Chest Port 1 View  02/06/2014   CLINICAL DATA:  Hypoxia  EXAM: PORTABLE CHEST - 1 VIEW  COMPARISON:  February 05, 2014  FINDINGS: Endotracheal tube tip is 2.8 cm above the carina. Nasogastric tube tip is in the midesophagus. Central catheter tip is in the superior vena cava. No pneumothorax. There is persistent interstitial edema with underlying emphysema. Heart is enlarged. The pulmonary vascularity reflects underlying emphysema. No adenopathy.  IMPRESSION: Tube and catheter positions as described without pneumothorax. Note that the nasogastric tube tip is in the mid esophageal region. Nasogastric tube tip needs advancing approximately 20 cm to confirm that tip and side port are below the diaphragm.  There is evidence of a degree of congestive heart failure with underlying emphysema. No airspace consolidation.  Critical Value/emergent results were called by telephone at the time of interpretation on 02/06/2014 at 7:40 am to Rich Fuchs, RN, who verbally acknowledged these results.   Electronically Signed   By: Lowella Grip M.D.   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 Cont BDs Solumedrol 40 q 24h and titrate slowly until wheezing subsides. Low dose lasix today. KVO IVF.  CARDIOVASCULAR RUE PICC 12/18 >>  A:  Chronic HTN PAF with RVR>>better on cardiazem Sedation related hypotension-resolved Echo EF 50% P:  SBP goal 120-130 mmHg Cont  Cardizem oral when passes swallow evaluation Cont PRN metoprolol Lovenox in place of eliquis Lasix 20 mg IV q8 hours x2 doses. KVO IVF.  RENAL A:  No acute issue P:   Monitor BMET intermittently Monitor I/Os Correct electrolytes as indicated  GASTROINTESTINAL A:   NPO P:   SUP: enteral pantoprazole FEES today. May need feeding tube if fails.  HEMATOLOGIC A:   ICU acquired anemia without overt bleeding P:  DVT px: SCDs Monitor CBC intermittently Transfuse per usual ICU guidelines  INFECTIOUS A:  Sputum + moraxella P:   ABX:    Rocephin 12/20>>>  Cultures:  Resp cult 12/20>>>moraxella beta lac ++  ENDOCRINE CBG (last 3)   Recent Labs  02/05/14 0805 02/05/14 1631 02/05/14 2356  GLUCAP 146* 174* 169*     A:   Hyperglycemia d/t steroids P:   Cont SSI  FAMILY  - Updates: daughter  Debbie updated @ bedside 12/21  Summary - Extubated 12/22, Swallow eval failed 12/22. Fees planned 12/23  [facility] Minor ACNP Maryanna Shape PCCM Pager [phone] till 3 pm If no answer page [phone]  Medications adjusted as above.  May transfer out of ICU, if fails swallow will need NGT.  Continue rehab.  Replace electrolytes.  Transfer to 4N per neuro.  PCCM will sign off, please call back if needed.  Rush Farmer, M.D. [facility] Pulmonary/Critical Care Medicine. Pager: [phone]. After hours pager: [phone].

## 2014-02-07 NOTE — Progress Notes (Signed)
Called Stroke/Neuro MD to inform that patient is NPO and unable to take PO meds including blood thinner Eliquis. Pt does not have NG tube.  Spoke with Dr. Janann Colonel, he stated that it was ok to leave patient NPO and without NG tube for the night.  Pt is to have FES study this am.   Margaree Mackintosh, Johnmark Geiger GARNER

## 2014-02-07 NOTE — Progress Notes (Signed)
Rehab admissions - I was asked to re evaluate patient for acute inpatient rehab admission.  I called [name], patient's daughter, who tells me that patient will have [employer] and will not be changing insurance carriers.  She does not have [place].  Last week we received a denial from [facility] for acute inpatient rehab.  I explained this to daughter.  Daughter may wish to re submit insurance request next week.  I will follow up with patient/daughter on Monday 12/28 for rehab needs and plans.  Call me for questions.  #[phone]

## 2014-02-07 NOTE — Clinical Documentation Improvement (Signed)
Please clarify conflicting documentation in regard to "Gliosis rather than Cytotoxic Edema per CT hear 01/29/14  Please clarify if cytotoxic edema per CT Head 01/30/14 in setting of Right MCA stroke was Ruled In our Out and document in pn or d/c summary  Possible Clinical Conditions? Cerebral Edema Cytotoxic Cerebral edema Vasogenic Edema  ____________________ ____________________ _______Other Condition__________________ _______Cannot Clinically Determine   Supporting Information: Risk Factors: Signs & Symptoms: Diagnostics: 12/15 Ct head: Mild cytotoxic edema suspected in that same right MCA territory.   CT Head 02/01/14 Evolved cytotoxic edema a in the right MCA territory since 01/30/2014, corresponding to the restricted diffusion identified yesterday.  CT Head 01/29/14 There is discrete cortical and subcortical low-attenuation in the anterior and posterior aspect of the right MCA territory, appearance favoring gliosis rather than cytotoxic edema.   Treatment Monitoring  Thank You, Heloise Beecham ,RN Clinical Documentation Specialist:  [phone]  [place] Information Management

## 2014-02-07 NOTE — Progress Notes (Signed)
Physical Therapy Treatment Patient Details Name: Summer Bradley MRN: [id number] DOB: 03/18/1945 Today's Date: 02/07/2014    History of Present Illness 68 yo female admitted from [facility] with L side weakness, vision loss and s/p fall hitting L side of face. intubated 01/29/14; 12/14 R MCA clot retrieval with revascularixation. pt extubated 01/30/14 PMH: HTN, CVA, AFIB, COPD, abdominal surg.  pt lethargic and found to have extension, re-intubated on 12/18.      PT Comments    Pt continues to improve mobility and remains impulsive.  Pt with improved balance today and needed less cueing for using visual input to find and maintain midline.  Pt would continue to benefit from CIR at D/C, but concerned about insurance willing to re-consider after denying pt earlier in stay.  May need to consider SNF at D/C.  Will continue to follow.    Follow Up Recommendations  SNF     Equipment Recommendations  None recommended by PT    Recommendations for Other Services       Precautions / Restrictions Precautions Precautions: Fall Restrictions Weight Bearing Restrictions: No    Mobility  Bed Mobility Overal bed mobility: Needs Assistance;+2 for physical assistance Bed Mobility: Supine to Sit     Supine to sit: Min assist;+2 for physical assistance     General bed mobility comments: cues to slow downa nd allow staff to attend to lines.  A with bringing trunk up to sitting.    Transfers Overall transfer level: Needs assistance Equipment used: 2 person hand held assist Transfers: Sit to/from [facility] Sit to Stand: Mod assist;+2 physical assistance Stand pivot transfers: Mod assist;+2 physical assistance       General transfer comment: cues for safe technique and to slow down and attend to safety.  pt needs visual cues to A with maintaining balance and finding midline.    Ambulation/Gait                 Stairs            Wheelchair Mobility    Modified  Rankin (Stroke Patients Only) Modified Rankin (Stroke Patients Only) Pre-Morbid Rankin Score: Moderate disability Modified Rankin: Severe disability     Balance Overall balance assessment: Needs assistance Sitting-balance support: Bilateral upper extremity supported;Feet supported Sitting balance-Leahy Scale: Poor Sitting balance - Comments: pt with L lateral lean and needs visual cues and verbal cueing to maintain midline.   Postural control: Left lateral lean Standing balance support: During functional activity Standing balance-Leahy Scale: Poor Standing balance comment: pt able to perform standing marching in place with cues for attending to balance.                      Cognition Arousal/Alertness: Awake/alert Behavior During Therapy: Impulsive Overall Cognitive Status: Impaired/Different from baseline Area of Impairment: Attention;Following commands;Safety/judgement;Awareness;Problem solving   Current Attention Level: Selective   Following Commands: Follows one step commands consistently;Follows multi-step commands inconsistently Safety/Judgement: Decreased awareness of safety;Decreased awareness of deficits Awareness: Emergent Problem Solving: Slow processing;Difficulty sequencing;Requires verbal cues;Requires tactile cues General Comments: pt continues to be oriented and following directions, but is still impulsive.    Exercises      General Comments        Pertinent Vitals/Pain Pain Assessment: No/denies pain    Home Living                      Prior Function  PT Goals (current goals can now be found in the care plan section) Acute Rehab PT Goals Patient Stated Goal: Back to normal.   PT Goal Formulation: With patient/family Time For Goal Achievement: 02/19/14 Potential to Achieve Goals: Good Progress towards PT goals: Progressing toward goals    Frequency  Min 4X/week    PT Plan Discharge plan needs to be updated     Co-evaluation             End of Session Equipment Utilized During Treatment: Gait belt;Oxygen Activity Tolerance: Patient tolerated treatment well Patient left: in chair;with call bell/phone within reach     Time: 1019-1040 PT Time Calculation (min) (ACUTE ONLY): 21 min  Charges:  $Therapeutic Activity: 8-22 mins                    G CodesCatarina Hartshorn, [place] 02/07/2014, 2:30 PM

## 2014-02-07 NOTE — Procedures (Signed)
Objective Swallowing Evaluation: Fiberoptic Endoscopic Evaluation of Swallowing  Patient Details  Name: Summer Bradley MRN: [id number] Date of Birth: 03-03-1945  Today's Date: 02/07/2014 Time: 1410-1440 SLP Time Calculation (min) (ACUTE ONLY): 30 min  Past Medical History:  Past Medical History  Diagnosis Date  . Hypertension   . Stroke   . A-fib   . COPD (chronic obstructive pulmonary disease)    Past Surgical History:  Past Surgical History  Procedure Laterality Date  . Abdominal surgery    . Radiology with anesthesia N/A 01/29/2014    Procedure: RADIOLOGY WITH ANESTHESIA;  Surgeon: Luanne Bras, MD;  Location: [facility];  Service: Radiology;  Laterality: N/A;  . Radiology with anesthesia N/A 02/01/2014    Procedure: RADIOLOGY WITH ANESTHESIA;  Surgeon: Rob Hickman, MD;  Location: [facility];  Service: Radiology;  Laterality: N/A;   HPI:  43 yof developed neuro changes 01/29/14 with left side weakness that resulted in a fall. Transported via EMS to [facility] where she was taken to IR and rt mca clot removal and revascularization procedure was performed. 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 Functionally, these deficits manifested as left buccal residue, reduced oral manipulation and control, anterior spillage, and wet phonation and cough associated with ice chips/thin liquids.  Pt advanced to dysphagia 2, thin liquids on 12/16.  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  Reintubated 12/17-12/22.  Revascularization 12/17.  12/18 MRI reveals new multi focal infarcts in the posterior circulation, affecting the mesial right temporal lobe (hippocampus included), right occipital cortex (small volume), bilateral mid and upper cerebellum (individual infarcts measure 15 mm or less), and right upper pons and midbrain.      Assessment / Plan /  Recommendation Clinical Impression  Dysphagia Diagnosis: Moderate oral phase dysphagia;Mild pharyngeal phase dysphagia Clinical impression: Pt presents with a mild oral, moderate pharyngeal dysphagia marked by poor oral control with anterior spillage and posterior bolus loss over base of tongue.  Thin and nectar-thick liquids penetrated to the vocal cords without eliciting a cough response.  There was adequate pharyngeal clearance of all POs, with only trace pyriform sinus residue post-swallow with purees.  Pt with significant impulsivity and inability to self-monitor rate/quantity when eating.  Recommend initiating a dysphagia 1 diet with honey-thick liquids; meds whole in puree.  Full supervision for safety.  Family present for FEES and viewed exam in real time; agreed with results and recommendations.     Treatment Recommendation       Diet Recommendation Dysphagia 1 (Puree);Honey-thick liquid   Liquid Administration via: Cup Medication Administration: Whole meds with puree Supervision: Full supervision/cueing for compensatory strategies Compensations: Slow rate;Small sips/bites;Check for pocketing;Check for anterior loss Postural Changes and/or Swallow Maneuvers: Seated upright 90 degrees    Other  Recommendations Oral Care Recommendations: Oral care BID Other Recommendations: Order thickener from pharmacy   Follow Up Recommendations  Inpatient Rehab    Frequency and Duration min 3x week  2 weeks       General Date of Onset: 01/29/14 HPI: 73 yof developed neuro changes 01/29/14 with left side weakness that resulted in a fall. Transported via EMS to [facility] where she was taken to IR and rt mca clot removal and revascularization procedure was performed. 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 Functionally, these deficits manifested as left buccal  residue, reduced oral manipulation  and control, anterior spillage, and wet phonation and cough associated with ice chips/thin liquids.  Pt advanced to dysphagia 2, thin liquids on 12/16.  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  Reintubated 12/17-12/22.  Revascularization 12/17.  12/18 MRI reveals new multi focal infarcts in the posterior circulation, affecting the mesial right temporal lobe (hippocampus included), right occipital cortex (small volume), bilateral mid and upper cerebellum (individual infarcts measure 15 mm or less), and right upper pons and midbrain.  Type of Study: Fiberoptic Endoscopic Evaluation of Swallowing Reason for Referral: Objectively evaluate swallowing function Previous Swallow Assessment: 02/06/14 Diet Prior to this Study: NPO Temperature Spikes Noted: No Respiratory Status: Nasal cannula History of Recent Intubation: Yes Length of Intubations (days):  (1 day, then 5 ) Date extubated: 02/06/14 Behavior/Cognition: Alert;Confused;Impulsive Oral Cavity - Dentition: Edentulous Oral Motor / Sensory Function: Impaired - see Bedside swallow eval Self-Feeding Abilities: Needs assist (due to impulsivity) Patient Positioning: Upright in bed Baseline Vocal Quality: Clear Volitional Cough: Strong Volitional Swallow: Able to elicit Anatomy:  (edema arytenoids) Pharyngeal Secretions: Normal    Reason for Referral Objectively evaluate swallowing function   Oral Phase Oral Preparation/Oral Phase Oral Phase: Impaired Oral - Honey Oral - Honey Cup: Left anterior bolus loss;Weak lingual manipulation Oral - Nectar Oral - Nectar Teaspoon: Left anterior bolus loss;Weak lingual manipulation Oral - Thin Oral - Thin Teaspoon: Left anterior bolus loss;Weak lingual manipulation Oral - Solids Oral - Puree: Left anterior bolus loss;Weak lingual manipulation   Pharyngeal Phase Pharyngeal Phase Pharyngeal Phase: Impaired Pharyngeal - Honey Pharyngeal - Honey Teaspoon: Premature  spillage to valleculae Pharyngeal - Honey Cup: Premature spillage to valleculae Pharyngeal - Nectar Pharyngeal - Nectar Cup: Premature spillage to pyriform sinuses;Penetration/Aspiration during swallow Penetration/Aspiration details (nectar cup): Material enters airway, CONTACTS cords and not ejected out Pharyngeal - Thin Pharyngeal - Thin Teaspoon: Premature spillage to pyriform sinuses;Penetration/Aspiration during swallow Penetration/Aspiration details (thin teaspoon): Material enters airway, CONTACTS cords and not ejected out Pharyngeal - Solids Pharyngeal - Puree: Premature spillage to valleculae;Pharyngeal residue - pyriform sinuses (mild residue)  Cervical Esophageal Phase   Shylo Dillenbeck L. Tivis Ringer, [place] CCC/SLP Pager [phone]               [name] 02/07/2014, 2:56 PM

## 2014-02-07 NOTE — Progress Notes (Signed)
Spoke with Dr. Elsworth Soho about pt being NPO until cleared with speech therapy.  He stated OK to hold meds for now.  Will continue to monitor pt.

## 2014-02-07 NOTE — Progress Notes (Signed)
NUTRITION FOLLOW-UP  DOCUMENTATION CODES Per approved criteria  -Obesity Unspecified   INTERVENTION: Supplement diet as appropriate  NUTRITION DIAGNOSIS: Inadequate oral intake related to inability to eat as evidenced by NPO status; ongoing.   Goal: Pt to meet >/= 90% of their estimated nutrition needs   Monitor:  Diet advancement, PO intake, weight trends, labs  ASSESSMENT: Acute R MCA CVA, s/p angiography, R MCA clot retrieval 12/14 with new stroke 12/17 and back to IR intervention.  Pt extubated 12/22 but remains NPO. Discussed with SLP who plans to complete a FEES on pt this afternoon.   Height: Ht Readings from Last 1 Encounters:  02/01/14 5\' 2"  (1.575 m)    Weight: Wt Readings from Last 1 Encounters:  02/01/14 198 lb 6.6 oz (90 kg)  Admission weight: 192 lb (87.2 kg) 12/14   BMI:  Body mass index is 36.28 kg/(m^2).  Estimated Nutritional Needs: Kcal: 1700-1900 Protein: 85-100 grams Fluid: > 1.7 L/day  Skin: incision, laceration  Diet Order: Diet NPO time specified   Intake/Output Summary (Last 24 hours) at 02/07/14 1020 Last data filed at 02/07/14 1001  Gross per 24 hour  Intake 715.75 ml  Output   2355 ml  Net -1639.25 ml    Last BM: PTA - pt started on Miralax  Labs:   Recent Labs Lab 02/05/14 0220 02/06/14 0500 02/07/14 0425  NA 145 144 145  K 3.8 3.1* 3.5  CL 104 100 103  CO2 31 29 32  BUN 30* 31* 31*  CREATININE 0.55 0.76 0.74  CALCIUM 9.3 8.9 8.7  MG  --   --  2.1  PHOS  --   --  4.0  GLUCOSE 154* 145* 119*    CBG (last 3)   Recent Labs  02/05/14 0805 02/05/14 1631 02/05/14 2356  GLUCAP 146* 174* 169*    Scheduled Meds: . antiseptic oral rinse  7 mL Mouth Rinse QID  . bisoprolol  2.5 mg Oral BID  . budesonide  0.5 mg Nebulization BID  . cefTRIAXone (ROCEPHIN)  IV  1 g Intravenous Q24H  . chlorhexidine  15 mL Mouth Rinse BID  . citalopram  20 mg Per Tube QHS  . diltiazem  60 mg Oral 4 times per day  . enoxaparin  (LOVENOX) injection  1 mg/kg Subcutaneous Q12H  . feeding supplement (PRO-STAT SUGAR FREE 64)  30 mL Per Tube BID  . furosemide  40 mg Intravenous Daily  . ipratropium  0.5 mg Nebulization TID  . levalbuterol  0.63 mg Nebulization TID  . levothyroxine  150 mcg Per Tube QAC breakfast  . methylPREDNISolone (SOLU-MEDROL) injection  40 mg Intravenous Daily  . pantoprazole sodium  40 mg Per Tube Q1200  . pravastatin  20 mg Per Tube q1800  . sodium chloride  10-40 mL Intracatheter Q12H    Continuous Infusions: . sodium chloride 35 mL/hr at 02/06/14 [place], LDN, CNSC [phone] Pager [phone] After Hours Pager

## 2014-02-08 ENCOUNTER — Inpatient Hospital Stay (HOSPITAL_COMMUNITY): Payer: Medicare HMO

## 2014-02-08 DIAGNOSIS — R0602 Shortness of breath: Secondary | ICD-10-CM | POA: Insufficient documentation

## 2014-02-08 DIAGNOSIS — E785 Hyperlipidemia, unspecified: Secondary | ICD-10-CM | POA: Insufficient documentation

## 2014-02-08 DIAGNOSIS — I10 Essential (primary) hypertension: Secondary | ICD-10-CM | POA: Insufficient documentation

## 2014-02-08 DIAGNOSIS — R1314 Dysphagia, pharyngoesophageal phase: Secondary | ICD-10-CM

## 2014-02-08 LAB — BASIC METABOLIC PANEL
Anion gap: 8 (ref 5–15)
BUN: 28 mg/dL — ABNORMAL HIGH (ref 6–23)
CO2: 33 mmol/L — ABNORMAL HIGH (ref 19–32)
Calcium: 8.5 mg/dL (ref 8.4–10.5)
Chloride: 97 mEq/L (ref 96–112)
Creatinine, Ser: 0.76 mg/dL (ref 0.50–1.10)
GFR calc Af Amer: >90 mL/min (ref >90)
GFR calc non Af Amer: 84 mL/min — ABNORMAL LOW (ref >90)
Glucose, Bld: 105 mg/dL — ABNORMAL HIGH (ref 70–99)
Potassium: 3.3 mmol/L — ABNORMAL LOW (ref 3.5–5.1)
Sodium: 138 mmol/L (ref 135–145)

## 2014-02-08 LAB — GLUCOSE, CAPILLARY: Glucose-Capillary: 112 mg/dL — ABNORMAL HIGH (ref 70–99)

## 2014-02-08 LAB — PHOSPHORUS: Phosphorus: 3.1 mg/dL (ref 2.3–4.6)

## 2014-02-08 LAB — CBC
HCT: 33.4 % — ABNORMAL LOW (ref 36.0–46.0)
Hemoglobin: 9.9 g/dL — ABNORMAL LOW (ref 12.0–15.0)
MCH: 24.8 pg — ABNORMAL LOW (ref 26.0–34.0)
MCHC: 29.6 g/dL — ABNORMAL LOW (ref 30.0–36.0)
MCV: 83.5 fL (ref 78.0–100.0)
Platelets: 574 10*3/uL — ABNORMAL HIGH (ref 150–400)
RBC: 4 MIL/uL (ref 3.87–5.11)
RDW: 19 % — ABNORMAL HIGH (ref 11.5–15.5)
WBC: 14 10*3/uL — ABNORMAL HIGH (ref 4.0–10.5)

## 2014-02-08 LAB — MAGNESIUM: Magnesium: 2.1 mg/dL (ref 1.5–2.5)

## 2014-02-08 MED ORDER — ACETYLCYSTEINE 10 % IN SOLN
4.0000 mL | RESPIRATORY_TRACT | Status: DC
  Filled 2014-02-08 (×6): qty 4

## 2014-02-08 MED ORDER — SODIUM CHLORIDE 0.9 % IV SOLN
INTRAVENOUS | Status: DC
  Administered 2014-02-08: 18:00:00 via INTRAVENOUS

## 2014-02-08 MED ORDER — SODIUM CHLORIDE 0.9 % IV SOLN
3.0000 g | Freq: Four times a day (QID) | INTRAVENOUS | Status: DC
  Administered 2014-02-08 – 2014-02-11 (×12): 3 g via INTRAVENOUS
  Filled 2014-02-08 (×15): qty 3

## 2014-02-08 MED ORDER — CETYLPYRIDINIUM CHLORIDE 0.05 % MT LIQD
7.0000 mL | Freq: Two times a day (BID) | OROMUCOSAL | Status: DC
  Administered 2014-02-09 – 2014-02-11 (×6): 7 mL via OROMUCOSAL

## 2014-02-08 MED ORDER — LEVALBUTEROL HCL 0.63 MG/3ML IN NEBU
0.6300 mg | INHALATION_SOLUTION | Freq: Four times a day (QID) | RESPIRATORY_TRACT | Status: DC | PRN
  Administered 2014-03-02: 0.63 mg via RESPIRATORY_TRACT
  Filled 2014-02-08 (×4): qty 3

## 2014-02-08 MED ORDER — IPRATROPIUM BROMIDE 0.02 % IN SOLN: 0.5000 mg | Freq: Four times a day (QID) | RESPIRATORY_TRACT | Status: DC | PRN

## 2014-02-08 MED ORDER — SODIUM CHLORIDE 0.9 % IN NEBU
3.0000 mL | INHALATION_SOLUTION | Freq: Four times a day (QID) | RESPIRATORY_TRACT | Status: DC
Start: 2014-02-08 — End: 2014-02-08

## 2014-02-08 MED ORDER — IPRATROPIUM BROMIDE 0.02 % IN SOLN
0.5000 mg | Freq: Four times a day (QID) | RESPIRATORY_TRACT | Status: DC | PRN
  Administered 2014-02-09: 0.5 mg via RESPIRATORY_TRACT
  Filled 2014-02-08: qty 2.5

## 2014-02-08 MED ORDER — ALBUTEROL SULFATE (2.5 MG/3ML) 0.083% IN NEBU: 2.5000 mg | INHALATION_SOLUTION | RESPIRATORY_TRACT | Status: DC | PRN

## 2014-02-08 MED ORDER — DEXTROSE-NACL 5-0.45 % IV SOLN
INTRAVENOUS | Status: DC
  Administered 2014-02-08: 15:00:00 via INTRAVENOUS

## 2014-02-08 MED ORDER — ACETYLCYSTEINE 20 % IN SOLN
4.0000 mL | RESPIRATORY_TRACT | Status: DC
  Filled 2014-02-08 (×6): qty 4

## 2014-02-08 MED ORDER — DM-GUAIFENESIN ER 30-600 MG PO TB12
1.0000 | ORAL_TABLET | Freq: Two times a day (BID) | ORAL | Status: DC
  Administered 2014-02-08 – 2014-02-10 (×5): 1 via ORAL
  Filled 2014-02-08 (×6): qty 1

## 2014-02-08 MED ORDER — CHLORHEXIDINE GLUCONATE 0.12 % MT SOLN
15.0000 mL | Freq: Two times a day (BID) | OROMUCOSAL | Status: DC
Start: 2014-02-08 — End: 2014-02-12
  Administered 2014-02-09 – 2014-02-11 (×6): 15 mL via OROMUCOSAL
  Filled 2014-02-08 (×10): qty 15

## 2014-02-08 NOTE — Progress Notes (Signed)
PT Cancellation Note  Patient Details Name: Summer Bradley MRN: [id number] DOB: [birth year]   Cancelled Treatment:    Reason Eval/Treat Not Completed: Medical issues which prohibited therapy . Patient having respiratory issues at this time. Nurses and MD at bedside with the patient for procedure. Will hold at this time.   Jacqualyn Posey 02/08/2014, 10:51 AM

## 2014-02-08 NOTE — Progress Notes (Signed)
STROKE TEAM PROGRESS NOTE   HISTORY Mazal Ebey is a 68 y.o. female who got up around 4:30 AM 01/29/2014 to go to the bathroom and then sat down in her chair. She is not certain if her left arm was working at that time or not. She then fell out of her chair around 5 AM and that time noticed that her left arm wasn't working. She did not realize that anything was wrong until she fell out of her chair.  She was in her normal state of health ongoing to bed the evening prior. She was last known well at 8p on 01/28/2014.  In the ER, a CT was obtained which shows evidence of a previous stroke, and a CT perfusion scan was obtained which shows a significant mismatch demonstrating an area of penumbra, though there is an smaller area of likely infarct core as well.  Patient was not administered TPA secondary to being  outside of the tPA window with question of subdural hematoma on CT. Due to mismatch, she was taken to neuro intervention where she had complete revascularization of RT MCA M1 occlusion using 2 passes with the Solitaire and 10 mg of IA Integrelin. TICI 3 flow reesablished. Afterwards, she was admitted to the neuro ICU for further evaluation and treatment.   SUBJECTIVE (INTERVAL HISTORY) Daughters at bedside. The patient tolerated extubation 2 days ago and tolerated OK. She was transferred to floor last night. However, as per family, pt coughed a lot last night and did not get any sleep. She became more respiratory distressed and SOB. Neurologically stable and following commands. Ordered chest X-ray and mucomyst neb as well as chest PT. Had CCM reevaluation and she was sent to step down unit for close monitoring. Due to concerning for aspiration, she was put on NPO except meds.   OBJECTIVE Temp:  [97.6 F (36.4 C)-98.4 F (36.9 C)] 97.7 F (36.5 C) (12/24 0543) Pulse Rate:  [77-152] 111 (12/24 0543) Cardiac Rhythm:  [-] Atrial fibrillation (12/24 0007) Resp:  [16-33] 22 (12/24 0543) BP:  (112-154)/(74-107) 147/90 mmHg (12/24 0543) SpO2:  [95 %-100 %] 96 % (12/24 0543)   Recent Labs Lab 02/05/14 0805 02/05/14 1631 02/05/14 2356 02/07/14 2015 02/08/14 0402  GLUCAP 146* 174* 169* 174* 112*    Recent Labs Lab 02/04/14 0220 02/05/14 0220 02/06/14 0500 02/07/14 0425 02/08/14 0630  NA 145 145 144 145 138  K 3.7 3.8 3.1* 3.5 3.3*  CL 106 104 100 103 97  CO2 30 31 29  32 33*  GLUCOSE 123* 154* 145* 119* 105*  BUN 23 30* 31* 31* 28*  CREATININE 0.57 0.55 0.76 0.74 0.76  CALCIUM 8.4 9.3 8.9 8.7 8.5  MG  --   --   --  2.1 2.1  PHOS  --   --   --  4.0 3.1   No results for input(s): AST, ALT, ALKPHOS, BILITOT, PROT, ALBUMIN in the last 168 hours.  Recent Labs Lab 02/01/14 1039 02/02/14 0245  02/04/14 0220 02/05/14 0220 02/06/14 0500 02/07/14 0425 02/08/14 0630  WBC 11.9* 12.3*  < > 8.7 10.4 12.0* 12.3* 14.0*  NEUTROABS 9.7* 10.1*  --   --   --   --   --   --   HGB 8.1* 8.4*  < > 8.6* 8.7* 9.0* 8.8* 9.9*  HCT 27.4* 29.0*  < > 29.1* 29.6* 30.7* 30.8* 33.4*  MCV 83.3 83.1  < > 84.8 82.2 81.6 82.4 83.5  PLT 234 228  < > 339  404* 504* 504* 574*  < > = values in this interval not displayed. No results for input(s): CKTOTAL, CKMB, CKMBINDEX, TROPONINI in the last 168 hours. No results for input(s): LABPROT, INR in the last 72 hours. No results for input(s): COLORURINE, LABSPEC, [place], GLUCOSEU, HGBUR, BILIRUBINUR, KETONESUR, PROTEINUR, UROBILINOGEN, NITRITE, LEUKOCYTESUR in the last 72 hours.  Invalid input(s): APPERANCEUR     Component Value Date/Time   CHOL 114 01/30/2014 0337   TRIG 102 01/30/2014 0337   HDL 24* 01/30/2014 0337   CHOLHDL 4.8 01/30/2014 0337   VLDL 20 01/30/2014 0337   LDLCALC 70 01/30/2014 0337   Lab Results  Component Value Date   HGBA1C 6.4* 01/30/2014      Component Value Date/Time   LABOPIA NONE DETECTED 01/29/2014 0659   COCAINSCRNUR NONE DETECTED 01/29/2014 0659   LABBENZ NONE DETECTED 01/29/2014 0659   AMPHETMU NONE  DETECTED 01/29/2014 0659   THCU NONE DETECTED 01/29/2014 0659   LABBARB NONE DETECTED 01/29/2014 0659    No results for input(s): ETH in the last 168 hours.  I have personally reviewed the radiological images below and agree with the radiology interpretations.  Ct Head Wo Contrast  01/30/2014    1. Resolved right MCA territory hyperdensity present at 1056 hrs yesterday, most likely was post Neurointervention parenchymal contrast staining. 2. Mild cytotoxic edema suspected in that same right MCA territory. Underlying chronic ischemic changes. 3. Stable parafalcine subdural hematoma. No new intracranial hemorrhage or acute mass effect. 4. No new intracranial abnormality.    01/29/2014    Acute infarct right MCA territory as noted on CT perfusion study. There is interval development of high density in the right temporoparietal cortex which may be due to contrast material or hemorrhage. Continued follow-up recommended.  Small interhemispheric subdural hematoma is unchanged.    01/29/2014   1. 4 mm thick posterior falx subdural hemorrhage. 2. Prominent density at the right M1/M2 junction. CTA is already planned. 3. Established infarcts in the anterior and posterior right MCA territory. No definitive acute infarct. 4. Left frontal scalp contusion.  No acute fracture.   02/01/14: Stat CT scan shows no acute changes in the brain, but there appears to be a thrombus in the basilar artery that is new.   Ct Cerebral Perfusion W/cm 01/29/2014    1. Occlusive thrombus at the right M1-M2 junction. Subjectively good pial/pial collateral circulation. 2. Perfusion findings suggest moderate acute infarct in the central right MCA territory with rim of penumbra, as above. 3. Remote infarcts at the anterior and posterior margins of the right MCA territory. 4. Thin posterior falx subdural hemorrhage.     Cerebral Angiogram 01/29/2014 S/P Bilateral common carotid arteriograms ,followed by complete revascularization of RT  MCA M1 occlusion usingX2 passes with the Solitaire FR 63mm x 2mm retrieval device And 10 mg of  superselective intracranial IA Integrelin. TICI 3 flow reesablished.   MRI brain 01/31/14: IMPRESSION: 1. Acute nonhemorrhagic infarct involving the superior right temporal lobe, right insular ribbon, right frontal operculum, and posterior right frontal lobe. 2. More remote infarcts are again noted in the more anterior inferior right frontal lobe and the right parietal lobe. 3. Extensive white matter changes bilaterally likely reflect the sequela of chronic microvascular ischemia, advanced for age. 4. White matter changes extend into the brainstem. 5. Remote tender infarcts of the cerebellum bilaterally, worse on the right.  MRI brain 02/02/14: IMPRESSION: 1. New, acute infarcts in the posterior circulation related to treated basilar thrombosis. Infarcts present in the bilateral  cerebellum, upper right brainstem, mesial right temporal lobe, and minimally along the right occipital cortex. Normal flow voids within the intracranial vessels. 2. Stable appearance of recent right MCA territory infarct. 3. Stable trace falcine subdural hemorrhage.  Dg Chest Port 1 View 01/30/2014    COPD with bibasilar scarring with superimposed subsegmental atelectasis. There is no evidence of CHF.    01/29/2014    Bilateral perihilar/infrahilar opacification likely interstitial edema and less likely infection.  Endotracheal tube with tip 5.2 cm above the carina.      2D Echocardiogram  EF 50-55% with no source of embolus.   A1C 6.4 adn LDL 70  Physical Exam  General: The patient is awake, following simple commands. The patient is moderately obese. The patient has ecchymosis over the left brow.  Respiratory: Lung fields are clear  Cardiovascular: Occasional irregular heart rhythm, no murmurs noted  Abdomen: Soft  Skin: No significant peripheral edema is noted.  Neurologic Exam  Mental status: The  patient is cooperative, follows simple commands. Extubated today.   Cranial nerves: Pupils are equal, reactive to light. Eye able to move bilaterally but not fully to the lateral edge. There is blink to threat from both sides. Mild dysarthria.  Motor: The left arm 3+/5, 3/5 distally, LLE 5/5, RUE and RLE 5/5.   Sensory examination: The patient has decreased sensation on the left arm, seems to have symmetric sensation on the legs.  Coordination: No obvious ataxia seen on the extremities. Difficult to test.  Gait and station: Gait was not tested.  Reflexes: Deep tendon reflexes are symmetric.  ASSESSMENT/PLAN Ms. Amoni Scallan is a 68 y.o. female with history of hypertension, stroke, afib and COPD presenting with Left-sided weakness. She did not receive IV t-PA due to delay in arrival, ? SDH. But underwent mechanical thrombectomy of right MCA with complete vascularization of right MCA M1 occlusion with IA Integrilin. 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 Dr. Estanislado Pandy was able to extract the clot, and the patient has sustained a right pontine and midbrain stroke, small bilateral cerebellar strokes, and some extension of the infarct in the mesial temporal area on the right.   Stroke: Suspect right MCA infarct and brainstem status post mechanical thrombectomy x 2. Etiology is consistent with Afib with subtherapeutic INR on coumadin  2D Echo EF 50-55% no source of embolus  HgbA1c 6.4  eliquis po for VTE prophylaxis  Resultant left arm weakness and left facial droop.   aspirin 81 mg orally every day and warfarin prior to admission, now on eliquis for stroke prevention  Neurologically stable   PT/OT on board  Transfer to floor today  Acute Respiratory Failure / COPD exacerbation   Intubated for neuro intervention-extubated 02/06/14  Tolerated well 24 hours post extubation.  On the floor overnight, more respiratory distress and  concerning for aspiration  Transfer to step down unit for further monitoring  NPO except meds   High risk of respiratory decline  Continue nebulization and steroids.  CCM on board, appreciate their help  Atrial Fibrillation  Home meds: Warfarin  INR subtherapeutic on admission, INR 1.18   started Cardizem and s/p d/c amiodarone.   Rate controlled.  On eliquis.  Hypertension  Continue to monitor  BP stable 120-140  On bisoprolol  Hyperlipidemia  Home meds: Mevacor  LDL 70, goal < 70  On pravastatin  Continue statin on d/c  Dysphagia - passed swallow yesterday - on dysphagia diet since yesterday but today due  to concerns of aspiration, she was put on NPO except meds. - d/c IVF  Other Stroke Risk Factors  Advanced age  Obesity, Body mass index is 31.93 kg/(m^2).   Hx stroke/TIA  Other Active Problems  COPD - home O2 at 2L  Depression, on chronic SSRI  Hospital day # 10   Rosalin Hawking, MD PhD Stroke Neurology 02/08/2014 3:49 PM To contact Stroke Continuity provider, please refer to [email or web address] After hours, contact General Neurology

## 2014-02-08 NOTE — Progress Notes (Signed)
STROKE TEAM PROGRESS NOTE   HISTORY Summer Bradley is a 68 y.o. female who got up around 4:30 AM 01/29/2014 to go to the bathroom and then sat down in her chair. She is not certain if her left arm was working at that time or not. She then fell out of her chair around 5 AM and that time noticed that her left arm wasn't working. She did not realize that anything was wrong until she fell out of her chair.  She was in her normal state of health ongoing to bed the evening prior. She was last known well at 8p on 01/28/2014.  In the ER, a CT was obtained which shows evidence of a previous stroke, and a CT perfusion scan was obtained which shows a significant mismatch demonstrating an area of penumbra, though there is an smaller area of likely infarct core as well.  Patient was not administered TPA secondary to being  outside of the tPA window with question of subdural hematoma on CT. Due to mismatch, she was taken to neuro intervention where she had complete revascularization of RT MCA M1 occlusion using 2 passes with the Solitaire and 10 mg of IA Integrelin. TICI 3 flow reesablished. Afterwards, she was admitted to the neuro ICU for further evaluation and treatment.   SUBJECTIVE (INTERVAL HISTORY) The patient tolerated extubation 2 days ago and tolerated OK. She was transferred to floor last night. However, as per family, pt coughed a lot last night and did not get any sleep. She became more respiratory distressed and SOB. Neurologically stable and following commands. Ordered chest X-ray and mucomyst neb as well as chest PT. Had CCM reevaluation and she was sent to step down unit for close monitoring. Due to concerning for aspiration, she was put on NPO except meds.  OBJECTIVE Temp:  [97.7 F (36.5 C)-98.6 F (37 C)] 98.6 F (37 C) (12/24 1200) Pulse Rate:  [65-126] 65 (12/24 1405) Cardiac Rhythm:  [-] Atrial fibrillation;Atrial flutter (12/24 1200) Resp:  [20-33] 30 (12/24 1405) BP: (125-147)/(74-90)  144/84 mmHg (12/24 1200) SpO2:  [93 %-100 %] 96 % (12/24 1436) FiO2 (%):  [35 %] 35 % (12/24 1100) Weight:  [174 lb 9.7 oz (79.2 kg)] 174 lb 9.7 oz (79.2 kg) (12/24 1200)   Recent Labs Lab 02/05/14 0805 02/05/14 1631 02/05/14 2356 02/07/14 2015 02/08/14 0402  GLUCAP 146* 174* 169* 174* 112*    Recent Labs Lab 02/04/14 0220 02/05/14 0220 02/06/14 0500 02/07/14 0425 02/08/14 0630  NA 145 145 144 145 138  K 3.7 3.8 3.1* 3.5 3.3*  CL 106 104 100 103 97  CO2 30 31 29  32 33*  GLUCOSE 123* 154* 145* 119* 105*  BUN 23 30* 31* 31* 28*  CREATININE 0.57 0.55 0.76 0.74 0.76  CALCIUM 8.4 9.3 8.9 8.7 8.5  MG  --   --   --  2.1 2.1  PHOS  --   --   --  4.0 3.1   No results for input(s): AST, ALT, ALKPHOS, BILITOT, PROT, ALBUMIN in the last 168 hours.  Recent Labs Lab 02/02/14 0245  02/04/14 0220 02/05/14 0220 02/06/14 0500 02/07/14 0425 02/08/14 0630  WBC 12.3*  < > 8.7 10.4 12.0* 12.3* 14.0*  NEUTROABS 10.1*  --   --   --   --   --   --   HGB 8.4*  < > 8.6* 8.7* 9.0* 8.8* 9.9*  HCT 29.0*  < > 29.1* 29.6* 30.7* 30.8* 33.4*  MCV 83.1  < >  84.8 82.2 81.6 82.4 83.5  PLT 228  < > 339 404* 504* 504* 574*  < > = values in this interval not displayed. No results for input(s): CKTOTAL, CKMB, CKMBINDEX, TROPONINI in the last 168 hours. No results for input(s): LABPROT, INR in the last 72 hours. No results for input(s): COLORURINE, LABSPEC, [place], GLUCOSEU, HGBUR, BILIRUBINUR, KETONESUR, PROTEINUR, UROBILINOGEN, NITRITE, LEUKOCYTESUR in the last 72 hours.  Invalid input(s): APPERANCEUR     Component Value Date/Time   CHOL 114 01/30/2014 0337   TRIG 102 01/30/2014 0337   HDL 24* 01/30/2014 0337   CHOLHDL 4.8 01/30/2014 0337   VLDL 20 01/30/2014 0337   LDLCALC 70 01/30/2014 0337   Lab Results  Component Value Date   HGBA1C 6.4* 01/30/2014      Component Value Date/Time   LABOPIA NONE DETECTED 01/29/2014 0659   COCAINSCRNUR NONE DETECTED 01/29/2014 0659   LABBENZ NONE  DETECTED 01/29/2014 0659   AMPHETMU NONE DETECTED 01/29/2014 0659   THCU NONE DETECTED 01/29/2014 0659   LABBARB NONE DETECTED 01/29/2014 0659    No results for input(s): ETH in the last 168 hours.  I have personally reviewed the radiological images below and agree with the radiology interpretations.   Ct Head Wo Contrast  02/01/2014  Stat CT scan shows no acute changes in the brain, but there appears to be a thrombus in the basilar artery that is new.  01/30/2014    1. Resolved right MCA territory hyperdensity present at 1056 hrs yesterday, most likely was post Neurointervention parenchymal contrast staining. 2. Mild cytotoxic edema suspected in that same right MCA territory. Underlying chronic ischemic changes. 3. Stable parafalcine subdural hematoma. No new intracranial hemorrhage or acute mass effect. 4. No new intracranial abnormality.    01/29/2014    Acute infarct right MCA territory as noted on CT perfusion study. There is interval development of high density in the right temporoparietal cortex which may be due to contrast material or hemorrhage. Continued follow-up recommended.  Small interhemispheric subdural hematoma is unchanged.    01/29/2014  There is discrete cortical and subcortical low-attenuation in the anterior and posterior aspect of the right MCA territory, appearance favoring gliosis rather than cytotoxic edema. No definitive acute infarct. Subtle high attenuation of the right M2 segment relative to the left. IMPRESSION: 1. 4 mm thick posterior falx subdural hemorrhage. 2. Prominent density at the right M1/M2 junction. CTA is already planned. 3. Established infarcts in the anterior and posterior right MCA territory. No definitive acute infarct. 4. Left frontal scalp contusion.  No acute fracture.    Ct Cerebral Perfusion W/cm 01/29/2014    1. Occlusive thrombus at the right M1-M2 junction. Subjectively good pial/pial collateral circulation. 2. Perfusion findings suggest  moderate acute infarct in the central right MCA territory with rim of penumbra, as above. 3. Remote infarcts at the anterior and posterior margins of the right MCA territory. 4. Thin posterior falx subdural hemorrhage.     Cerebral Angiogram 01/29/2014 S/P Bilateral common carotid arteriograms ,followed by complete revascularization of RT MCA M1 occlusion usingX2 passes with the Solitaire FR 61mm x 33mm retrieval device And 10 mg of  superselective intracranial IA Integrelin. TICI 3 flow reesablished.   MRI brain 01/31/14: IMPRESSION: 1. Acute nonhemorrhagic infarct involving the superior right temporal lobe, right insular ribbon, right frontal operculum, and posterior right frontal lobe. 2. More remote infarcts are again noted in the more anterior inferior right frontal lobe and the right parietal lobe. 3. Extensive white matter changes  bilaterally likely reflect the sequela of chronic microvascular ischemia, advanced for age. 4. White matter changes extend into the brainstem. 5. Remote tender infarcts of the cerebellum bilaterally, worse on the right.  MRI brain 02/02/14: IMPRESSION: 1. New, acute infarcts in the posterior circulation related to treated basilar thrombosis. Infarcts present in the bilateral cerebellum, upper right brainstem, mesial right temporal lobe, and minimally along the right occipital cortex. Normal flow voids within the intracranial vessels. 2. Stable appearance of recent right MCA territory infarct. 3. Stable trace falcine subdural hemorrhage.  Dg Chest Port 1 View 01/30/2014    COPD with bibasilar scarring with superimposed subsegmental atelectasis. There is no evidence of CHF.    01/29/2014    Bilateral perihilar/infrahilar opacification likely interstitial edema and less likely infection.  Endotracheal tube with tip 5.2 cm above the carina.      2D Echocardiogram  EF 50-55% with no source of embolus.   A1C 6.4 adn LDL 70  Physical Exam  General: The  patient is awake, following simple commands. The patient is moderately obese. The patient has ecchymosis over the left brow.  Respiratory: Lung field left is clear but right side more coarse rhonchi and wheezing.  Cardiovascular: regular rate and rhythm, no murmurs noted  Abdomen: Soft  Skin: No significant peripheral edema is noted.  Neurologic Exam  Mental status: The patient is cooperative, follows simple commands. Orientated to time place and people.  Cranial nerves: Pupils are equal, reactive to light. Eye able to move bilaterally but not fully to the lateral edge. There is blink to threat from both sides. Mild dysarthria.  Motor: The left arm 3+/5, 3/5 distally, LLE 5/5, RUE and RLE 5/5.   Sensory examination: The patient has decreased sensation on the left arm, seems to have symmetric sensation on the legs.  Coordination: No obvious ataxia seen on the extremities. Difficult to test.  Gait and station: Gait was not tested.  Reflexes: Deep tendon reflexes are symmetric.  ASSESSMENT/PLAN Ms. Sierra Spargo is a 68 y.o. female with history of hypertension, stroke, afib and COPD presenting with Left-sided weakness. She did not receive IV t-PA due to delay in arrival, ? SDH. But underwent mechanical thrombectomy of right MCA with complete vascularization of right MCA M1 occlusion with IA Integrilin. 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 Dr. Estanislado Pandy was able to extract the clot, and the patient has sustained a right pontine and midbrain stroke, small bilateral cerebellar strokes, and some extension of the infarct in the mesial temporal area on the right.   Stroke:  Suspect right MCA infarct and brainstem status post mechanical thrombectomy x 2. Etiology is consistent with Afib with subtherapeutic INR on coumadin  2D Echo  EF 50-55% no source of embolus  HgbA1c 6.4  eliquis po for VTE prophylaxis  Resultant left arm weakness and  left facial droop.   aspirin 81 mg orally every day and warfarin prior to admission, now on eliquis for stroke prevention  Neurologically stable   PT/OT on board  Transfer to floor today  Acute Respiratory Failure / COPD exacerbation   Intubated for neuro intervention-extubated 02/06/14  Tolerated well 24 hours post extubation.  On the floor overnight, more respiratory distress and concerning for aspiration  Transfer to step down unit for further monitoring  NPO except meds   High risk of respiratory decline  Continue nebulization and steroids.  CCM on board, appreciate their help  Atrial Fibrillation  Home meds:  Warfarin  INR subtherapeutic on admission, INR 1.18   started Cardizem and s/p d/c amiodarone.   Rate controlled.  On eliquis.    Hypertension  Continue to monitor  BP stable 120-140  On bisoprolol  Hyperlipidemia  Home meds:  Mevacor  LDL 70, goal < 70  On pravastatin  Continue statin on d/c  Dysphagia - passed swallow yesterday - on dysphagia diet since yesterday but today due to concerns of aspiration, she was put on NPO except meds. - d/c IVF  Other Stroke Risk Factors  Advanced age  Obesity, Body mass index is 31.93 kg/(m^2).   Hx stroke/TIA  Other Active Problems  COPD - home O2 at 2L  Depression, on chronic SSRI  Hospital day # 10   Rosalin Hawking, MD PhD Stroke Neurology 02/08/2014 3:49 PM   To contact Stroke Continuity provider, please refer to [email or web address] After hours, contact General Neurology

## 2014-02-08 NOTE — Progress Notes (Signed)
PULMONARY / CRITICAL CARE MEDICINE   Name: Summer Bradley MRN: [id number] DOB: Jan 30, 1946    ADMISSION DATE:  01/29/2014 CONSULTATION DATE:  01/29/2014  REFERRING MD : Neuro  CHIEF COMPLAINT: Left sided weakness  HISTORY OF PRESENT ILLNESS:   72 F with COPD, PAF admitted with acute CVA to Stroke Service after clot retrieval from R MCA  STUDIES/SIGNIFICANT EVENTS: 12/14 CTA head: Occlusive thrombus at the right M1-M2 junction. Subjectively good pial/pial collateral circulation. Perfusion findings suggest moderate acute infarct in the central right MCA territory with rim of penumbra, as above. Remote infarcts at the anterior and posterior margins of the right MCA territory. Thin posterior falx subdural hemorrhage  12/14 admitted to stroke  service after Neuro IR clot retrieval from R MCA. Intubated for procedure. PCCM consultation 12/14 CT head: Acute infarct right MCA territory as noted on CT perfusion study. There is interval development of high density in the right  temporoparietal cortex which may be due to contrast material or hemorrhage.  12/14 Echocardiogram: Lvef 50-55% 12/15 Ct head: Mild cytotoxic edema suspected in that same right MCA territory. Underlying chronic ischemic changes. Stable parafalcine subdural hematoma. No new intracranial hemorrhage or acute mass effect. No new intracranial abnormality 12/15 Extubated 12/16 Transferred out of ICU. PCCM signed off 12/16 MRI brain: Acute nonhemorrhagic infarct involving the superior right temporal lobe, right insular ribbon, right frontal operculum, and posterior right frontal lobe. More remote infarcts are again noted in the more anterior inferior right frontal lobe and the right parietal lobe. Extensive white matter changes bilaterally likely reflect the sequela of chronic microvascular ischemia, advanced for age. White matter changes extend into the brainstem. Remote tender infarcts of the cerebellum bilaterally, worse on the  right. 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 Early edema suspected in the thalami right greater than left  12/17 Repeat cerebral angiogram by Neuro IR with clot retrieval. Remained intubated after procedure 12/17 CT head: Large RIGHT MCA territory infarct extending to RIGHT basal ganglia. Underlying atrophy and small vessel chronic ischemic changes. Stable parafalcine subdural hematoma. No new intracranial abnormalities  12/18 failed SBT due to tachypnea and low Vt. Wheezing noted 12/18 MRI brain: New, acute infarcts in the posterior circulation related to treated basilar thrombosis. Infarcts present in the bilateral cerebellum, upper right brainstem, mesial right temporal lobe, and minimally along the right occipital cortex 12/22  extubation.    12/22 failed swallow eval. 12/23 FEES planned  Tubes/Lines: OETT 12/14>>12/15, 12/17 >>12/22 RUE PICC 12/18 >>  SUBJECTIVE:  Worse cough last night consistent with time of eating dinner although passed FEES. She was coughing and dyspneic all night last night. Very wet cough with minimal productiveness. Family states that she couldn't get it up and it sounded like it was caught in her throat.    VITAL SIGNS: Temp:  [97.6 F (36.4 C)-98.4 F (36.9 C)] 97.7 F (36.5 C) (12/24 0543) Pulse Rate:  [77-152] 111 (12/24 0543) Resp:  [20-33] 22 (12/24 0543) BP: (112-154)/(74-107) 147/90 mmHg (12/24 0543) SpO2:  [95 %-100 %] 96 % (12/24 0543) HEMODYNAMICS: VENTILATOR SETTINGS:   INTAKE / OUTPUT:  Intake/Output Summary (Last 24 hours) at 02/08/14 1012 Last data filed at 02/08/14 0308  Gross per 24 hour  Intake   1270 ml  Output   2865 ml  Net  -1595 ml    PHYSICAL EXAMINATION: General:  RASS 0, + F/C, alert, follows commands. Neuro: awake, follows commands, RASS 0, LUE weakness 1/5, very  alert,speech clear, left facial droop HEENT: abrasion to L eye unchanged Cardiovascular:  IRIR, tachy  113, no M Lungs: Diminished breath sounds R>L. Weak, congested cough, sounds like secretions in upper airway.  Abdomen:  Obese, large midline scar Musculoskeletal:  intact Skin:  Warm , no lower ext edema  LABS:  Recent Labs Lab 02/06/14 0500 02/07/14 0425 02/08/14 0630  NA 144 145 138  K 3.1* 3.5 3.3*  CL 100 103 97  CO2 29 32 33*  BUN 31* 31* 28*  CREATININE 0.76 0.74 0.76  GLUCOSE 145* 119* 105*    Recent Labs Lab 02/06/14 0500 02/07/14 0425 02/08/14 0630  HGB 9.0* 8.8* 9.9*  HCT 30.7* 30.8* 33.4*  WBC 12.0* 12.3* 14.0*  PLT 504* 504* 574*    Dg Chest Port 1 View  02/07/2014   CLINICAL DATA:  Respiratory failure  EXAM: PORTABLE CHEST - 1 VIEW  COMPARISON:  February 06, 2014  FINDINGS: Nasogastric tube is been removed. Central catheter tip is in the superior cava. No pneumothorax. There is underlying emphysema. Interstitial edema is stable. No new opacity. Heart is enlarged with pulmonary vascularity reflecting the underlying emphysema. No adenopathy.  IMPRESSION: Stable pulmonary edema and cardiomegaly superimposed on emphysema. Central catheter tip in superior vena cava. No pneumothorax. Nasogastric tube no longer present.   Electronically Signed   By: Lowella Grip M.D.   On: 02/07/2014 07:58     ASSESSMENT / PLAN:  Suspect aspiration pneumonitis Airway plugging 2/2 Aspiration Sputum + moraxella 12/20 VDRF (resolved)  COPD with bronchospasm(better) Suspect mild pulm edema(better with diuresis) - Titrate O2 for sats 90-94% - Humidified O2 - Cont scheduled and PRN levalbuterol nebs - Nasotracheal suctioning PRN - D/c mucomyst nebs - Start mucinex - Atrovent to PRN - Add IV Unasyn for aspiration - Solumedrol 40 q 24h wean as able - Start NPO diet - Transfer to SDU for closer observastion  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 ICU associated discomfort - Stroke mgmt per Stroke team   Dysphagia - Passed FEES 12/23, however suspect she  is still aspirating  - NPO, will likely need NGT for nutrition    Attending:  I have seen and examined the patient with Georgann Housekeeper NP and agree with the note above.   On my exam she has crackles in the R lung and was breathing comfortably on facemask oxygen.  She has no stridor. The most likely scenario here is that she aspirated into the right lung.  Plan: Cover aspiration pneumonia with unasyn Make NPO Move to step down for closer monitoring May need panda  Roselie Awkward, MD [place] [facility] Pager: [phone] Cell: [phone] If no response, call [phone]

## 2014-02-08 NOTE — Progress Notes (Signed)
SPEECH PATHOLOGY  Pt's speech/swallow orders were discontinued upon transfer to 4N; pt now on stepdown due to respiratory issues; currently NPO.  When diet is reordered, please ensure it is Dysphagia 1 with honey-thick liquids due to dysphagia. Have requested reinstatement of SLP orders. Thank you, Tyannah Sane L. Tivis Ringer, [place] CCC/SLP Pager [phone]

## 2014-02-08 NOTE — Progress Notes (Signed)
ANTIBIOTIC CONSULT NOTE - INITIAL  Pharmacy Consult for Unasyn Indication: pneumonia, aspiration  Allergies  Allergen Reactions  . Lisinopril     Unknown reaction    Patient Measurements: Height: 5\' 2"  (157.5 cm) Weight: 198 lb 6.6 oz (90 kg) IBW/kg (Calculated) : 50.1  Vital Signs: Temp: 97.7 F (36.5 C) (12/24 0543) Temp Source: Axillary (12/24 1022) BP: 129/80 mmHg (12/24 1022) Pulse Rate: 106 (12/24 1022) Intake/Output from previous day: 12/23 0701 - 12/24 0700 In: 0177 [P.O.:1020; I.V.:105; IV Piggyback:250] Out: 2990 [Urine:2990] Intake/Output from this shift:    Labs:  Recent Labs  02/06/14 0500 02/07/14 0425 02/08/14 0630  WBC 12.0* 12.3* 14.0*  HGB 9.0* 8.8* 9.9*  PLT 504* 504* 574*  CREATININE 0.76 0.74 0.76   Estimated Creatinine Clearance: 69.3 mL/min (by C-G formula based on Cr of 0.76). No results for input(s): VANCOTROUGH, VANCOPEAK, VANCORANDOM, GENTTROUGH, GENTPEAK, GENTRANDOM, TOBRATROUGH, TOBRAPEAK, TOBRARND, AMIKACINPEAK, AMIKACINTROU, AMIKACIN in the last 72 hours.   Microbiology: Recent Results (from the past 720 hour(s))  MRSA PCR Screening     Status: None   Collection Time: 01/29/14 11:28 AM  Result Value Ref Range Status   MRSA by PCR NEGATIVE NEGATIVE Final    Comment:        The GeneXpert MRSA Assay (FDA approved for NASAL specimens only), is one component of a comprehensive MRSA colonization surveillance program. It is not intended to diagnose MRSA infection nor to guide or monitor treatment for MRSA infections.   Culture, respiratory (NON-Expectorated)     Status: None   Collection Time: 02/04/14 11:30 AM  Result Value Ref Range Status   Specimen Description TRACHEAL ASPIRATE  Final   Special Requests Normal  Final   Gram Stain   Final    ABUNDANT WBC PRESENT, PREDOMINANTLY PMN NO SQUAMOUS EPITHELIAL CELLS SEEN ABUNDANT GRAM NEGATIVE COCCI Performed at [facility]    Culture   Final    ABUNDANT MORAXELLA  CATARRHALIS(BRANHAMELLA) Note: BETA LACTAMASE POSITIVE Performed at [facility]    Report Status 02/06/2014 FINAL  Final  Clostridium Difficile by PCR     Status: None   Collection Time: 02/05/14  3:50 PM  Result Value Ref Range Status   C difficile by pcr NEGATIVE NEGATIVE Final    Medical History: Past Medical History  Diagnosis Date  . Hypertension   . Stroke   . A-fib   . COPD (chronic obstructive pulmonary disease)    Assessment: 68 yo F admitted 01/29/2014 with L sided weakness>>R MCA infarct. 12/24 pharmacy consulted to dose unasyn for aspiration PNA  PMH: HTN, CVA, afib, COPD, hypothyroid  AC: Afib, admitted on warfarin, with subtherapeutic INR, now on Apixaban, slightly anemic, no bleeding noted, dose appropriate - warfarin removed from PTA med list, asa dc'd  ID: Aspiration PNA WBC rising (on steroids), afebrile CTX 12/20>> Unasyn 12/24  12/20 TA - moraxella catarrhalis  CV: Hx HTN, afib  lipid panel WNL except low HDL  BP 150/91, HR 90 On:  dilt, bisoprolol, metoprolol prn, pravastatin  Endo:  hypothyroid TSH WNL(A1c 6.4)   - synthroid, MP 40 IV daily  GI/Nutr: NPO - Tbili slightly elevated, PPI - FEES planned  Neuro: Hx CVA, acute CVA s/p revascularization of R MCA occlusion - citalopram, UDS neg  Renal: CrCL 65-70 ml/min, K 3.3  (supplementing)  Pulm: Hx COPD - 97% 4L [place], methylpred, pulmicort, atrovent, xopenex (PTA symbicort)  Best practices: lovenox, MC, PPI  Goal of Therapy:  Renal adjustment of antibiotics.  Plan:  1. Unasyn 3g IV q6h 2. Follow up SCr, UOP, cultures, clinical course and adjust as clinically indicated.  Thank you for allowing pharmacy to be a part of this patients care team.  Rowe Robert Pharm.D., BCPS, AQ-Cardiology Clinical Pharmacist 02/08/2014 11:16 AM Pager: [phone] Phone: [phone]

## 2014-02-08 NOTE — Progress Notes (Signed)
Orders to transfer to step down; report called to 3S RN; patient transported via bed with O2 and tele.; tolerated without distress.

## 2014-02-08 NOTE — Progress Notes (Signed)
Occupational Therapy Evaluation Patient Details Name: Summer Bradley MRN: [id number] DOB: 10/30/1945 Today's Date: 02/08/2014    History of Present Illness 68 yo female admitted from [facility] with L side weakness, vision loss and s/p fall hitting L side of face. intubated 01/29/14; 12/14 R MCA clot retrieval with revascularixation. pt extubated 01/30/14 PMH: HTN, CVA, AFIB, COPD, abdominal surg.  pt lethargic and found to have extension, re-intubated on 12/18.     Clinical Impression   PTA pt lived at home alone and used RW for ambulation. She reports that an aide was going to start to help her with ADLs as they had become difficult due to respiratory issues. Pt currently limited by decreased strength, decreased cognition, and impulsivity in addition to cardiopulmonary difficulties which impair her independence with ADLs. Pt will be an excellent CIR candidate. However, if insurance denies, pt will need SNF at d/c due to level of care and need for 24/7 supervision.     Follow Up Recommendations  CIR;Supervision/Assistance - 24 hour (will need SNF if CIR declines)    Equipment Recommendations  3 in 1 bedside comode    Recommendations for Other Services       Precautions / Restrictions Precautions Precautions: Fall Restrictions Weight Bearing Restrictions: No      Mobility Bed Mobility Overal bed mobility: Needs Assistance;+2 for physical assistance Bed Mobility: Supine to Sit     Supine to sit: Min assist;+2 for physical assistance;HOB elevated     General bed mobility comments: VC's to slow down. A with elevating trunk. Pt able to hold OT hands with Bil UEs and pull herself to sitting.   Transfers Overall transfer level: Needs assistance Equipment used: 1 person hand held assist Transfers: Sit to/from [facility] Sit to Stand: Mod assist Stand pivot transfers: Mod assist       General transfer comment: VC's for safe technqiue and to slow down. Pt needs visual  cues and step by step sequencing.          ADL Overall ADL's : Needs assistance/impaired Eating/Feeding: Sitting;Set up Eating/Feeding Details (indicate cue type and reason): pt able to suction mouth using [place] independently Grooming: Wash/dry face;Brushing hair;Set up;Sitting   Upper Body Bathing: Minimal assitance;Sitting   Lower Body Bathing: Maximal assistance;Sit to/from stand   Upper Body Dressing : Sitting;Minimal assistance   Lower Body Dressing: Total assistance;Sit to/from stand;+2 for physical assistance   Toilet Transfer: Moderate assistance;Stand-pivot;BSC   Toileting- Clothing Manipulation and Hygiene: Total assistance;+2 for physical assistance;Sit to/from stand         General ADL Comments: Pt c/o need to have BM however sat on BSC without success. Pt impulsive and limited due to safety.      Vision Eye Alignment: Within Functional Limits Alignment/Gaze Preference: Within Defined Limits Ocular Range of Motion: Other (comment) (grossly WFL however decreased ROM laterally on both sides) Tracking/Visual Pursuits: Other (comment) (difficulty tracking, will further test)         Additional Comments: per neuro, left hemianopsia. Difficult to test today due to pt having difficulty following commands for visual screen.    Perception Perception Perception Tested?: No   Praxis Praxis Praxis tested?: Within functional limits    Pertinent Vitals/Pain Pain Assessment: No/denies pain     Hand Dominance Right   Extremity/Trunk Assessment Upper Extremity Assessment Upper Extremity Assessment: LUE deficits/detail;Generalized weakness LUE Deficits / Details: L shoulder flexion 3-/5 (about 1/2 ROM against gravity), bicep/tricep 5/5, weak grip strength   Lower Extremity Assessment Lower Extremity  Assessment: Generalized weakness   Cervical / Trunk Assessment Cervical / Trunk Assessment: Normal   Communication Communication Communication: No  difficulties   Cognition Arousal/Alertness: Awake/alert Behavior During Therapy: Impulsive Overall Cognitive Status: Impaired/Different from baseline Area of Impairment: Attention;Following commands;Safety/judgement;Awareness;Problem solving   Current Attention Level: Selective   Following Commands: Follows one step commands consistently;Follows multi-step commands inconsistently Safety/Judgement: Decreased awareness of safety;Decreased awareness of deficits Awareness: Emergent Problem Solving: Slow processing;Difficulty sequencing;Requires verbal cues;Requires tactile cues General Comments: pt continues to be oriented and following directions, but is still impulsive.              Home Living Family/patient expects to be discharged to:: Inpatient rehab   Available Help at Discharge: Family Type of Home: House                              Lives With: Alone    Prior Functioning/Environment Level of Independence: Needs assistance  Gait / Transfers Assistance Needed: Used RW for ambulation ADL's / Homemaking Assistance Needed: Family A with homemaking tasks and pt states she was supposed to have an aide starting on Monday to help with ADLs.          OT Diagnosis: Generalized weakness;Cognitive deficits;Disturbance of vision;Paresis   OT Problem List: Decreased strength;Decreased activity tolerance;Impaired balance (sitting and/or standing);Impaired vision/perception;Decreased coordination;Decreased cognition;Decreased safety awareness;Decreased knowledge of use of DME or AE;Decreased knowledge of precautions;Impaired UE functional use;Cardiopulmonary status limiting activity   OT Treatment/Interventions: Self-care/ADL training;Therapeutic exercise;Energy conservation;DME and/or AE instruction;Therapeutic activities;Patient/family education;Balance training;Cognitive remediation/compensation;Visual/perceptual remediation/compensation    OT Goals(Current goals can be  found in the care plan section) Acute Rehab OT Goals Patient Stated Goal: to live by myself again OT Goal Formulation: With patient Time For Goal Achievement: 02/22/14 Potential to Achieve Goals: Good ADL Goals Pt Will Perform Grooming: with min assist;standing Pt Will Perform Upper Body Bathing: with set-up;with supervision;sitting Pt Will Perform Upper Body Dressing: with set-up;with supervision;sitting Pt Will Transfer to Toilet: with supervision;ambulating;bedside commode  OT Frequency: Min 2X/week    End of Session Equipment Utilized During Treatment: Gait belt  Activity Tolerance: Patient tolerated treatment well Patient left: in chair;with call bell/phone within reach;with chair alarm set   Time: 1761-6073 OT Time Calculation (min): 30 min Charges:  OT General Charges $OT Visit: 1 Procedure OT Evaluation $Initial OT Evaluation Tier I: 1 Procedure OT Treatments $Self Care/Home Management : 8-22 mins G-Codes:    Juluis Rainier 02/12/2014, 10:27 AM   Cyndie Chime, OTR/L Occupational Therapist [phone] (pager)

## 2014-02-09 DIAGNOSIS — I639 Cerebral infarction, unspecified: Secondary | ICD-10-CM

## 2014-02-09 MED ORDER — LEVALBUTEROL HCL 0.63 MG/3ML IN NEBU
0.6300 mg | INHALATION_SOLUTION | Freq: Four times a day (QID) | RESPIRATORY_TRACT | Status: DC
  Administered 2014-02-09 – 2014-02-19 (×40): 0.63 mg via RESPIRATORY_TRACT
  Filled 2014-02-09 (×70): qty 3

## 2014-02-09 MED ORDER — PANTOPRAZOLE SODIUM 40 MG PO TBEC
40.0000 mg | DELAYED_RELEASE_TABLET | Freq: Two times a day (BID) | ORAL | Status: DC
  Administered 2014-02-09 – 2014-02-13 (×9): 40 mg via ORAL
  Filled 2014-02-09 (×8): qty 1

## 2014-02-09 MED ORDER — FUROSEMIDE 10 MG/ML IJ SOLN
40.0000 mg | Freq: Once | INTRAMUSCULAR | Status: AC
  Administered 2014-02-09: 40 mg via INTRAVENOUS
  Filled 2014-02-09: qty 4

## 2014-02-09 MED ORDER — POTASSIUM CHLORIDE 10 MEQ/50ML IV SOLN
10.0000 meq | INTRAVENOUS | Status: AC
  Administered 2014-02-09 (×4): 10 meq via INTRAVENOUS
  Filled 2014-02-09 (×4): qty 50

## 2014-02-09 MED ORDER — SODIUM CHLORIDE 0.9 % IV SOLN
INTRAVENOUS | Status: DC
  Administered 2014-02-09 – 2014-02-11 (×2): via INTRAVENOUS

## 2014-02-09 MED ORDER — STARCH (THICKENING) PO POWD: ORAL | Status: DC | PRN

## 2014-02-09 NOTE — Progress Notes (Signed)
[place] Progress Note Patient Name: Summer Bradley DOB: 07-07-1945 MRN: [id number]   Date of Service  02/09/2014  HPI/Events of Note  Increased WOB  eICU Interventions  Lasix X 1 CXR in AM     Intervention Category Major Interventions: Respiratory failure - evaluation and management  Merton Border 02/09/2014, 12:46 AM

## 2014-02-09 NOTE — Progress Notes (Signed)
STROKE TEAM PROGRESS NOTE   HISTORY Summer Bradley is a 68 y.o. female who got up around 4:30 AM 01/29/2014 to go to the bathroom and then sat down in her chair. She is not certain if her left arm was working at that time or not. She then fell out of her chair around 5 AM and that time noticed that her left arm wasn't working. She did not realize that anything was wrong until she fell out of her chair.  She was in her normal state of health ongoing to bed the evening prior. She was last known well at 8p on 01/28/2014.  In the ER, a CT was obtained which shows evidence of a previous stroke, and a CT perfusion scan was obtained which shows a significant mismatch demonstrating an area of penumbra, though there is an smaller area of likely infarct core as well.  Patient was not administered TPA secondary to being  outside of the tPA window with question of subdural hematoma on CT. Due to mismatch, she was taken to neuro intervention where she had complete revascularization of RT MCA M1 occlusion using 2 passes with the Solitaire and 10 mg of IA Integrelin. TICI 3 flow reesablished. Afterwards, she was admitted to the neuro ICU for further evaluation and treatment.   SUBJECTIVE (INTERVAL HISTORY) Her daughter is at the bedside. She reports overall improved today, but did have an "episode" during the night of difficulty breathing and coughing after receiving medications in a thickened protein drink. States she did much better with applesauce. Pt was made NPO since yesterday with meds okayed.   OBJECTIVE Temp:  [97.5 F (36.4 C)-98.7 F (37.1 C)] 98.7 F (37.1 C) (12/25 0413) Pulse Rate:  [63-106] 63 (12/25 0413) Cardiac Rhythm:  [-] Atrial flutter (12/25 0413) Resp:  [20-31] 23 (12/25 0413) BP: (108-144)/(60-84) 121/66 mmHg (12/25 0413) SpO2:  [91 %-100 %] 100 % (12/25 0843) FiO2 (%):  [35 %] 35 % (12/24 2330) Weight:  [79.2 kg (174 lb 9.7 oz)] 79.2 kg (174 lb 9.7 oz) (12/24 1200)   Recent  Labs Lab 02/05/14 0805 02/05/14 1631 02/05/14 2356 02/07/14 2015 02/08/14 0402  GLUCAP 146* 174* 169* 174* 112*    Recent Labs Lab 02/04/14 0220 02/05/14 0220 02/06/14 0500 02/07/14 0425 02/08/14 0630  NA 145 145 144 145 138  K 3.7 3.8 3.1* 3.5 3.3*  CL 106 104 100 103 97  CO2 30 31 29  32 33*  GLUCOSE 123* 154* 145* 119* 105*  BUN 23 30* 31* 31* 28*  CREATININE 0.57 0.55 0.76 0.74 0.76  CALCIUM 8.4 9.3 8.9 8.7 8.5  MG  --   --   --  2.1 2.1  PHOS  --   --   --  4.0 3.1   No results for input(s): AST, ALT, ALKPHOS, BILITOT, PROT, ALBUMIN in the last 168 hours.  Recent Labs Lab 02/04/14 0220 02/05/14 0220 02/06/14 0500 02/07/14 0425 02/08/14 0630  WBC 8.7 10.4 12.0* 12.3* 14.0*  HGB 8.6* 8.7* 9.0* 8.8* 9.9*  HCT 29.1* 29.6* 30.7* 30.8* 33.4*  MCV 84.8 82.2 81.6 82.4 83.5  PLT 339 404* 504* 504* 574*   No results for input(s): CKTOTAL, CKMB, CKMBINDEX, TROPONINI in the last 168 hours. No results for input(s): LABPROT, INR in the last 72 hours. No results for input(s): COLORURINE, LABSPEC, [place], GLUCOSEU, HGBUR, BILIRUBINUR, KETONESUR, PROTEINUR, UROBILINOGEN, NITRITE, LEUKOCYTESUR in the last 72 hours.  Invalid input(s): APPERANCEUR     Component Value Date/Time   CHOL  114 01/30/2014 0337   TRIG 102 01/30/2014 0337   HDL 24* 01/30/2014 0337   CHOLHDL 4.8 01/30/2014 0337   VLDL 20 01/30/2014 0337   LDLCALC 70 01/30/2014 0337   Lab Results  Component Value Date   HGBA1C 6.4* 01/30/2014      Component Value Date/Time   LABOPIA NONE DETECTED 01/29/2014 0659   COCAINSCRNUR NONE DETECTED 01/29/2014 0659   LABBENZ NONE DETECTED 01/29/2014 0659   AMPHETMU NONE DETECTED 01/29/2014 0659   THCU NONE DETECTED 01/29/2014 0659   LABBARB NONE DETECTED 01/29/2014 0659    No results for input(s): ETH in the last 168 hours.   Ct Head Wo Contrast  02/01/2014  Stat CT scan shows no acute changes in the brain, but there appears to be a thrombus in the basilar  artery that is new.  01/30/2014    1. Resolved right MCA territory hyperdensity present at 1056 hrs yesterday, most likely was post Neurointervention parenchymal contrast staining. 2. Mild cytotoxic edema suspected in that same right MCA territory. Underlying chronic ischemic changes. 3. Stable parafalcine subdural hematoma. No new intracranial hemorrhage or acute mass effect. 4. No new intracranial abnormality.    01/29/2014    Acute infarct right MCA territory as noted on CT perfusion study. There is interval development of high density in the right temporoparietal cortex which may be due to contrast material or hemorrhage. Continued follow-up recommended.  Small interhemispheric subdural hematoma is unchanged.    01/29/2014   1. 4 mm thick posterior falx subdural hemorrhage. 2. Prominent density at the right M1/M2 junction. CTA is already planned. 3. Established infarcts in the anterior and posterior right MCA territory. No definitive acute infarct. 4. Left frontal scalp contusion.  No acute fracture.    Ct Cerebral Perfusion W/cm 01/29/2014    1. Occlusive thrombus at the right M1-M2 junction. Subjectively good pial/pial collateral circulation. 2. Perfusion findings suggest moderate acute infarct in the central right MCA territory with rim of penumbra, as above. 3. Remote infarcts at the anterior and posterior margins of the right MCA territory. 4. Thin posterior falx subdural hemorrhage.     Cerebral Angiogram  01/29/2014 S/P Bilateral common carotid arteriograms, followed by complete revascularization of RT MCA M1 occlusion usingX2 passes with the Solitaire FR 67mm x 51mm retrieval device And 10 mg of  superselective intracranial IA Integrelin. TICI 3 flow reesablished.   MRI brain   02/02/14 1. New, acute infarcts in the posterior circulation related to treated basilar thrombosis. Infarcts present in the bilateral cerebellum, upper right brainstem, mesial right temporal lobe, and minimally  along the right occipital cortex. Normal flow voids within the intracranial vessels. 2. Stable appearance of recent right MCA territory infarct. 3. Stable trace falcine subdural hemorrhage.  01/31/14 1. Acute nonhemorrhagic infarct involving the superior right temporal lobe, right insular ribbon, right frontal operculum, and posterior right frontal lobe. 2. More remote infarcts are again noted in the more anterior inferior right frontal lobe and the right parietal lobe. 3. Extensive white matter changes bilaterally likely reflect the sequela of chronic microvascular ischemia, advanced for age. 4. White matter changes extend into the brainstem. 5. Remote tender infarcts of the cerebellum bilaterally, worse on the right.  Dg Chest Port 1 View 02/09/2014 Pending 02/08/2014    1. Right PICC line stable position. 2. Congestive heart failure with interstitial edema and small right pleural effusion. Mild improvement from prior exam.  2D Echocardiogram  EF 50-55% with no source of embolus.   A1C  6.4 and LDL 70   Physical Exam General: The patient is awake, following simple commands. The patient is moderately obese. The patient has ecchymosis over the left brow. Respiratory: Lung field left is clear but right side more coarse rhonchi and wheezing. Cardiovascular: regular rate and rhythm, no murmurs noted Abdomen: Soft Skin: No significant peripheral edema is noted. Neurologic Exam Mental status: The patient is cooperative, follows simple commands. Orientated to time place and people. Cranial nerves: Pupils are equal, reactive to light. Eye able to move bilaterally but not fully to the lateral edge. There is blink to threat from both sides. Mild dysarthria. Mild left facial asymmetry.  Motor: The left arm 3+/5, 3/5 distally, LLE 5/5, RUE and RLE 5/5.  Sensory examination: The patient has decreased sensation on the left arm, seems to have symmetric sensation on the legs. Coordination: No  obvious ataxia seen on the extremities. Difficult to test. Gait and station: Gait was not tested. Reflexes: Deep tendon reflexes are symmetric.   ASSESSMENT/PLAN Ms. Summer Bradley is a 68 y.o. female with history of hypertension, stroke, afib and COPD presenting with Left-sided weakness. She did not receive IV t-PA due to delay in arrival, ? SDH. But underwent mechanical thrombectomy of right MCA with complete vascularization of right MCA M1 occlusion with IA Integrilin. 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 Dr. Estanislado Pandy was able to extract the clot, and the patient has sustained a right pontine and midbrain stroke, small bilateral cerebellar strokes, and some extension of the infarct in the mesial temporal area on the right.   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 Infarcts embolic secondary to Afib with subtherapeutic INR on coumadin Trace falcine subdural hemorrhage.  Resultant left arm weakness and left facial droop  2D Echo  EF 50-55% no source of embolus  HgbA1c 6.4  eliquis po for VTE prophylaxis  aspirin 81 mg orally every day and warfarin prior to admission, changed to eliquis for stroke prevention  Neurologically stable   PT/OT on board and recommend CIR. Admissions coordinator is following.   Acute Respiratory Failure / COPD exacerbation   Intubated for neuro intervention-extubated 02/06/14  Tolerated well 24 hours post extubation.  Once to the floor 12/23, overnight developed more respiratory distress and concerning for aspiration  12/24 Transferred to step down unit for further monitoring  Now NPO except meds  Another aspiration "spell" over night   At risk for of respiratory decline  Continue  nebulization and steroids.  CCM on board, appreciate their help  Follow up CXR ordered this am - Pending  Continue step-down level care  Atrial Fibrillation  Home meds:  Warfarin  INR subtherapeutic on admission, INR 1.18   started Cardizem and s/p d/c amiodarone.   Rate controlled.  On eliquis.    Hypertension  Continue to monitor  BP stable 120-140  On bisoprolol  Hyperlipidemia  Home meds:  Mevacor  LDL 70, goal < 70  On pravastatin  Continue statin on d/c  Dysphagia  passed swallow yesterday (D1 honey)  on dysphagia diet since yesterday 12/24 but today due to concerns of aspiration, she was put on NPO except meds.  IVF d/c'd yesterday. Given NPO status, will resume at 50/hr for now  Other Stroke Risk Factors  Advanced age  Obesity, Body mass index is 31.93 kg/(m^2).  Hx stroke/TIA  Other Active Problems  COPD - home O2 at 2L  Depression, on chronic SSRI  Hospital day # [place], [place] [facility]    To contact Stroke Continuity provider, please refer to [email or web address] After hours, contact General Neurology

## 2014-02-09 NOTE — Progress Notes (Signed)
Speech Language Pathology Treatment: Dysphagia  Patient Details Name: Summer Bradley MRN: [id number] DOB: [birth year] Today's Date: 02/09/2014 Time: 2563-8937 SLP Time Calculation (min) (ACUTE ONLY): 16 min  Assessment / Plan / Recommendation Clinical Impression  Resuming intervention for dysphagia tx per new orders: Pt with family at bedside; she has been requesting POs all morning.  Pt's clinical presentation remains similar to that during 12/23 FEES.  She continues to have oral sensorimotor deficits with prolonged mastication, impaired sensation on left leading to buccal pocketing, spilling from left oral cavity.  Consumption of purees and honey-thick liquids appears to be safe, with no overt s/s of aspiration.  RR in range compatible with adequate respiratory/swallowing coordination.  Pt's primary barrier to safety with POs is her cognitive status - her impulsivity leads to rapid consumption of large boluses, putting her at risk for aspiration.  Recommend FULL supervision with all POs, and hand-over-hand assistance with feeding to help regulate rate/size. D/W family.  SLP will resume f/u for cognition and dysphagia.    HPI HPI: 85 yof developed neuro changes 01/29/14 with left side weakness that resulted in a fall. Transported via EMS to [facility] where she was taken to IR and rt mca clot removal and revascularization procedure was performed. 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 Functionally, these deficits manifested as left buccal residue, reduced oral manipulation and control, anterior spillage, and wet phonation and cough associated with ice chips/thin liquids.  Pt advanced to dysphagia 2, thin liquids on 12/16.  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  Reintubated 12/17-12/22.  Revascularization 12/17.  12/18 MRI reveals new multi focal infarcts in the  posterior circulation, affecting the mesial right temporal lobe (hippocampus included), right occipital cortex (small volume), bilateral mid and upper cerebellum (individual infarcts measure 15 mm or less), and right upper pons and midbrain. FEES 12/23 with recs to begin dysphagia 1, honey-thick liquids.  Pt subsequently with respiratory decline; pt made NPO 12/23 pm.      Pertinent Vitals Pain Assessment: No/denies pain  SLP Plan  Continue with current plan of care    Recommendations Diet recommendations: Dysphagia 1 (puree);Honey-thick liquid Liquids provided via: Cup;No straw Medication Administration: Whole meds with puree Supervision: Full supervision/cueing for compensatory strategies Compensations: Slow rate;Small sips/bites;Check for pocketing;Check for anterior loss Postural Changes and/or Swallow Maneuvers: Seated upright 90 degrees              Plan: Continue with current plan of care   Summer Bradley L. Tivis Ringer, [place] CCC/SLP Pager [phone]      Juan Quam Summer Bradley 02/09/2014, 3:41 PM

## 2014-02-09 NOTE — Progress Notes (Signed)
PULMONARY / CRITICAL CARE MEDICINE   Name: Summer Bradley MRN: [id number] DOB: 1945/05/01    ADMISSION DATE:  01/29/2014 CONSULTATION DATE:  01/29/2014  REFERRING MD : Neuro  CHIEF COMPLAINT: Left sided weakness  HISTORY OF PRESENT ILLNESS:   71 yowf  with COPD, PAF admitted with acute CVA to Stroke Service after clot retrieval from R MCA  STUDIES/SIGNIFICANT EVENTS: 12/14 CTA head: Occlusive thrombus at the right M1-M2 junction. Subjectively good pial/pial collateral circulation. Perfusion findings suggest moderate acute infarct in the central right MCA territory with rim of penumbra, as above. Remote infarcts at the anterior and posterior margins of the right MCA territory. Thin posterior falx subdural hemorrhage  12/14 admitted to stroke  service after Neuro IR clot retrieval from R MCA. Intubated for procedure. PCCM consultation 12/14 CT head: Acute infarct right MCA territory as noted on CT perfusion study. There is interval development of high density in the right  temporoparietal cortex which may be due to contrast material or hemorrhage.  12/14 Echocardiogram: Lvef 50-55% 12/15 Ct head: Mild cytotoxic edema suspected in that same right MCA territory. Underlying chronic ischemic changes. Stable parafalcine subdural hematoma. No new intracranial hemorrhage or acute mass effect. No new intracranial abnormality 12/15 Extubated 12/16 Transferred out of ICU. PCCM signed off 12/16 MRI brain: Acute nonhemorrhagic infarct involving the superior right temporal lobe, right insular ribbon, right frontal operculum, and posterior right frontal lobe. More remote infarcts are again noted in the more anterior inferior right frontal lobe and the right parietal lobe. Extensive white matter changes bilaterally likely reflect the sequela of chronic microvascular ischemia, advanced for age. White matter changes extend into the brainstem. Remote tender infarcts of the cerebellum bilaterally, worse on the  right. 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 Early edema suspected in the thalami right greater than left  12/17 Repeat cerebral angiogram by Neuro IR with clot retrieval. Remained intubated after procedure 12/17 CT head: Large RIGHT MCA territory infarct extending to RIGHT basal ganglia. Underlying atrophy and small vessel chronic ischemic changes. Stable parafalcine subdural hematoma. No new intracranial abnormalities  12/18 failed SBT due to tachypnea and low Vt. Wheezing noted 12/18 MRI brain: New, acute infarcts in the posterior circulation related to treated basilar thrombosis. Infarcts present in the bilateral cerebellum, upper right brainstem, mesial right temporal lobe, and minimally along the right occipital cortex 12/22  extubation.    12/22 failed swallow eval. 12/23 FEES planned  Tubes/Lines: OETT 12/14>>12/15, 12/17 >>12/22 RUE PICC 12/18 >>  SUBJECTIVE:  Had a rough night not coughing up secretions but looks better today per nursing/ daughter   VITAL SIGNS: Temp:  [97.5 F (36.4 C)-98.7 F (37.1 C)] 97.7 F (36.5 C) (12/25 0843) Pulse Rate:  [51-100] 51 (12/25 0841) Resp:  [23-31] 24 (12/25 0841) BP: (108-144)/(60-91) 126/91 mmHg (12/25 0841) SpO2:  [91 %-100 %] 100 % (12/25 0843) FiO2 (%):  [35 %] 35 % (12/24 2330) Weight:  [174 lb 9.7 oz (79.2 kg)] 174 lb 9.7 oz (79.2 kg) (12/24 1200) HEMODYNAMICS: VENTILATOR SETTINGS: Vent Mode:  [-]  FiO2 (%):  [35 %] 35 % INTAKE / OUTPUT:  Intake/Output Summary (Last 24 hours) at 02/09/14 1127 Last data filed at 02/09/14 0800  Gross per 24 hour  Intake 438.33 ml  Output   2375 ml  Net -1936.67 ml    PHYSICAL EXAMINATION: General:  Chronically ill appearing - no increased wob on nasal 02  3lpm   HEENT: abrasion  to L eye unchanged Cardiovascular:  IRIR, tachy 113, no M Lungs: Diminished breath sounds R>L. Weak, congested cough, sounds like secretions in upper  airway.  Abdomen:  Obese, large midline scar Musculoskeletal:  intact Skin:  Warm , no lower ext edema  LABS:  Recent Labs Lab 02/06/14 0500 02/07/14 0425 02/08/14 0630  NA 144 145 138  K 3.1* 3.5 3.3*  CL 100 103 97  CO2 29 32 33*  BUN 31* 31* 28*  CREATININE 0.76 0.74 0.76  GLUCOSE 145* 119* 105*    Recent Labs Lab 02/06/14 0500 02/07/14 0425 02/08/14 0630  HGB 9.0* 8.8* 9.9*  HCT 30.7* 30.8* 33.4*  WBC 12.0* 12.3* 14.0*  PLT 504* 504* 574*    Dg Chest Port 1 View  02/08/2014   CLINICAL DATA:  Shortness breath.  EXAM: PORTABLE CHEST - 1 VIEW  COMPARISON:  02/07/2014.  FINDINGS: Right PICC line in stable position. Cardiomegaly with mild pulmonary vascular prominence. Diffuse bilateral interstitial prominence again noted. Interstitial prominence has diminished slightly from prior exam. Small right pleural effusion. No pneumothorax.  IMPRESSION: 1. Right PICC line stable position. 2. Congestive heart failure with interstitial edema and small right pleural effusion. Mild improvement from prior exam.   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

## 2014-02-09 NOTE — Progress Notes (Signed)
Pt with increased SOB around 2345, cough and congestion. Pt has a very congested cough and unable to cough up secretions. Family at bedside very anxious, states this happened earlier this AM and pt had to have NTS because she could not breath. Resp therapy to bedside, NTS or oral suctioning completed with brief relief. Oxygen changed to face mask per family request however pt would not keep mask on. Increased anxiety and agitation with mask, placed back on nasal canula with improvement. MD(Simonds) called per family request, ordered received. Pt is resting at this time, VSS. Will continue to monitor

## 2014-02-10 ENCOUNTER — Inpatient Hospital Stay (HOSPITAL_COMMUNITY): Payer: Medicare HMO

## 2014-02-10 MED ORDER — GUAIFENESIN ER 600 MG PO TB12
1200.0000 mg | ORAL_TABLET | Freq: Two times a day (BID) | ORAL | Status: DC
Start: 2014-02-10 — End: 2014-02-13
  Administered 2014-02-10 – 2014-02-12 (×6): 1200 mg via ORAL
  Filled 2014-02-10 (×8): qty 2

## 2014-02-10 MED ORDER — FLUCONAZOLE 200 MG PO TABS
200.0000 mg | ORAL_TABLET | Freq: Once | ORAL | Status: AC
  Administered 2014-02-10: 200 mg via ORAL
  Filled 2014-02-10: qty 1

## 2014-02-10 MED ORDER — FLUCONAZOLE 100 MG PO TABS
100.0000 mg | ORAL_TABLET | Freq: Every day | ORAL | Status: AC
  Administered 2014-02-11 – 2014-02-24 (×14): 100 mg via ORAL
  Filled 2014-02-10 (×15): qty 1

## 2014-02-10 MED ORDER — FLUCONAZOLE 100 MG PO TABS
100.0000 mg | ORAL_TABLET | Freq: Every day | ORAL | Status: DC
  Filled 2014-02-10: qty 1

## 2014-02-10 NOTE — Progress Notes (Signed)
PULMONARY / CRITICAL CARE MEDICINE   Name: Summer Bradley MRN: [id number] DOB: 1945/11/03    ADMISSION DATE:  01/29/2014 CONSULTATION DATE:  01/29/2014  REFERRING MD : Neuro  CHIEF COMPLAINT: Left sided weakness  HISTORY OF PRESENT ILLNESS:   64 yowf  with COPD quit smoking 12 y pta , PAF admitted with acute CVA to Stroke Service after clot retrieval from R MCA  STUDIES/SIGNIFICANT EVENTS: 12/14 CTA head: Occlusive thrombus at the right M1-M2 junction. Subjectively good pial/pial collateral circulation. Perfusion findings suggest moderate acute infarct in the central right MCA territory with rim of penumbra, as above. Remote infarcts at the anterior and posterior margins of the right MCA territory. Thin posterior falx subdural hemorrhage  12/14 admitted to stroke  service after Neuro IR clot retrieval from R MCA. Intubated for procedure. PCCM consultation 12/14 CT head: Acute infarct right MCA territory as noted on CT perfusion study. There is interval development of high density in the right  temporoparietal cortex which may be due to contrast material or hemorrhage.  12/14 Echocardiogram: Lvef 50-55% 12/15 Ct head: Mild cytotoxic edema suspected in that same right MCA territory. Underlying chronic ischemic changes. Stable parafalcine subdural hematoma. No new intracranial hemorrhage or acute mass effect. No new intracranial abnormality 12/15 Extubated 12/16 Transferred out of ICU. PCCM signed off 12/16 MRI brain: Acute nonhemorrhagic infarct involving the superior right temporal lobe, right insular ribbon, right frontal operculum, and posterior right frontal lobe. More remote infarcts are again noted in the more anterior inferior right frontal lobe and the right parietal lobe. Extensive white matter changes bilaterally likely reflect the sequela of chronic microvascular ischemia, advanced for age. White matter changes extend into the brainstem. Remote tender infarcts of the cerebellum  bilaterally, worse on the right. 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 Early edema suspected in the thalami right greater than left  12/17 Repeat cerebral angiogram by Neuro IR with clot retrieval. Remained intubated after procedure 12/17 CT head: Large RIGHT MCA territory infarct extending to RIGHT basal ganglia. Underlying atrophy and small vessel chronic ischemic changes. Stable parafalcine subdural hematoma. No new intracranial abnormalities  12/18 failed SBT due to tachypnea and low Vt. Wheezing noted 12/18 MRI brain: New, acute infarcts in the posterior circulation related to treated basilar thrombosis. Infarcts present in the bilateral cerebellum, upper right brainstem, mesial right temporal lobe, and minimally along the right occipital cortex 12/22  extubation.    12/22 failed swallow eval. 12/23 FEES planned  Tubes/Lines: OETT 12/14>>12/15, 12/17 >>12/22 RUE PICC 12/18 >>  SUBJECTIVE:  Had a rough night again and early am mucus plug now cleared and comfortable sitting up in chair    VITAL SIGNS: Temp:  [96.9 F (36.1 C)-97.8 F (36.6 C)] 97.3 F (36.3 C) (12/26 0728) Pulse Rate:  [31-67] 41 (12/26 0402) Resp:  [18-26] 22 (12/26 0402) BP: (105-136)/(59-80) 136/80 mmHg (12/26 0402) SpO2:  [77 %-100 %] 100 % (12/26 0728) FiO2 (%):  [40 %] 40 % (12/26 0728) HEMODYNAMICS: VENTILATOR SETTINGS: Vent Mode:  [-]  FiO2 (%):  [40 %] 40 % INTAKE / OUTPUT:  Intake/Output Summary (Last 24 hours) at 02/10/14 1102 Last data filed at 02/10/14 0600  Gross per 24 hour  Intake   1000 ml  Output    415 ml  Net    585 ml    PHYSICAL EXAMINATION: General:  Chronically ill appearing - no increased wob   HEENT:  Mucosa somewhat dry Cardiovascular:  IRIR,   Lungs: Diminished breath sounds  Bilaterally but no localized or gen wheezing   Abdomen:  Obese, large midline scar Musculoskeletal:  intact Skin:  Warm , no lower ext  edema  LABS:  Recent Labs Lab 02/06/14 0500 02/07/14 0425 02/08/14 0630  NA 144 145 138  K 3.1* 3.5 3.3*  CL 100 103 97  CO2 29 32 33*  BUN 31* 31* 28*  CREATININE 0.76 0.74 0.76  GLUCOSE 145* 119* 105*    Recent Labs Lab 02/06/14 0500 02/07/14 0425 02/08/14 0630  HGB 9.0* 8.8* 9.9*  HCT 30.7* 30.8* 33.4*  WBC 12.0* 12.3* 14.0*  PLT 504* 504* 574*    Dg Chest Port 1 View  02/10/2014   CLINICAL DATA:  Respiratory failure  EXAM: PORTABLE CHEST - 1 VIEW  COMPARISON:  02/08/2014  FINDINGS: Stable PICC. Stable moderate cardiomegaly. Bibasilar opacities improved. Upper lungs clear. No pneumothorax.  IMPRESSION: Improved bibasilar atelectasis versus airspace disease.   Electronically Signed   By: Maryclare Bean M.D.   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

## 2014-02-10 NOTE — Progress Notes (Signed)
Pt c/o not able to breath, noticed pt having stridor and the look of doom. Neb treatment given, increased oxygen to 98% humidified mask, respiratory called and at bedside. Unable to auscultate any air movement on left side, Right side diminished and rhonchi. NTS several times removed thick yellow secretion and then large mucus plug. Auscultated chest for rhonchi.pt breathing more comfortable. Will continue to monitor.

## 2014-02-10 NOTE — Progress Notes (Addendum)
STROKE TEAM PROGRESS NOTE   HISTORY Summer Bradley is a 68 y.o. female who got up around 4:30 AM 01/29/2014 to go to the bathroom and then sat down in her chair. She is not certain if her left arm was working at that time or not. She then fell out of her chair around 5 AM and that time noticed that her left arm wasn't working. She did not realize that anything was wrong until she fell out of her chair.  She was in her normal state of health ongoing to bed the evening prior. She was last known well at 8p on 01/28/2014.  In the ER, a CT was obtained which shows evidence of a previous stroke, and a CT perfusion scan was obtained which shows a significant mismatch demonstrating an area of penumbra, though there is an smaller area of likely infarct core as well.  Patient was not administered TPA secondary to being  outside of the tPA window with question of subdural hematoma on CT. Due to mismatch, she was taken to neuro intervention where she had complete revascularization of RT MCA M1 occlusion using 2 passes with the Solitaire and 10 mg of IA Integrelin. TICI 3 flow reesablished. Afterwards, she was admitted to the neuro ICU for further evaluation and treatment.   SUBJECTIVE (INTERVAL HISTORY): Didn't sleep much last night. Daughters are at bedside. Had a mucous plug again improved with deep suctioning but scared her a lot. Followed by pulmonary.    OBJECTIVE Temp:  [96.9 F (36.1 C)-97.8 F (36.6 C)] 97.8 F (36.6 C) (12/26 0402) Pulse Rate:  [31-67] 41 (12/26 0402) Cardiac Rhythm:  [-] Atrial flutter (12/25 2000) Resp:  [18-26] 22 (12/26 0402) BP: (105-136)/(59-80) 136/80 mmHg (12/26 0402) SpO2:  [77 %-100 %] 100 % (12/26 0728) FiO2 (%):  [40 %] 40 % (12/26 0728)   Recent Labs Lab 02/05/14 0805 02/05/14 1631 02/05/14 2356 02/07/14 2015 02/08/14 0402  GLUCAP 146* 174* 169* 174* 112*    Recent Labs Lab 02/04/14 0220 02/05/14 0220 02/06/14 0500 02/07/14 0425 02/08/14 0630  NA  145 145 144 145 138  K 3.7 3.8 3.1* 3.5 3.3*  CL 106 104 100 103 97  CO2 30 31 29  32 33*  GLUCOSE 123* 154* 145* 119* 105*  BUN 23 30* 31* 31* 28*  CREATININE 0.57 0.55 0.76 0.74 0.76  CALCIUM 8.4 9.3 8.9 8.7 8.5  MG  --   --   --  2.1 2.1  PHOS  --   --   --  4.0 3.1   No results for input(s): AST, ALT, ALKPHOS, BILITOT, PROT, ALBUMIN in the last 168 hours.  Recent Labs Lab 02/04/14 0220 02/05/14 0220 02/06/14 0500 02/07/14 0425 02/08/14 0630  WBC 8.7 10.4 12.0* 12.3* 14.0*  HGB 8.6* 8.7* 9.0* 8.8* 9.9*  HCT 29.1* 29.6* 30.7* 30.8* 33.4*  MCV 84.8 82.2 81.6 82.4 83.5  PLT 339 404* 504* 504* 574*   No results for input(s): CKTOTAL, CKMB, CKMBINDEX, TROPONINI in the last 168 hours. No results for input(s): LABPROT, INR in the last 72 hours. No results for input(s): COLORURINE, LABSPEC, [place], GLUCOSEU, HGBUR, BILIRUBINUR, KETONESUR, PROTEINUR, UROBILINOGEN, NITRITE, LEUKOCYTESUR in the last 72 hours.  Invalid input(s): APPERANCEUR     Component Value Date/Time   CHOL 114 01/30/2014 0337   TRIG 102 01/30/2014 0337   HDL 24* 01/30/2014 0337   CHOLHDL 4.8 01/30/2014 0337   VLDL 20 01/30/2014 0337   LDLCALC 70 01/30/2014 0337   Lab Results  Component Value Date   HGBA1C 6.4* 01/30/2014      Component Value Date/Time   LABOPIA NONE DETECTED 01/29/2014 0659   COCAINSCRNUR NONE DETECTED 01/29/2014 0659   LABBENZ NONE DETECTED 01/29/2014 0659   AMPHETMU NONE DETECTED 01/29/2014 0659   THCU NONE DETECTED 01/29/2014 0659   LABBARB NONE DETECTED 01/29/2014 0659    No results for input(s): ETH in the last 168 hours.   Ct Head Wo Contrast  02/01/2014  Stat CT scan shows no acute changes in the brain, but there appears to be a thrombus in the basilar artery that is new.  01/30/2014    1. Resolved right MCA territory hyperdensity present at 1056 hrs yesterday, most likely was post Neurointervention parenchymal contrast staining. 2. Mild cytotoxic edema suspected in that  same right MCA territory. Underlying chronic ischemic changes. 3. Stable parafalcine subdural hematoma. No new intracranial hemorrhage or acute mass effect. 4. No new intracranial abnormality.    01/29/2014    Acute infarct right MCA territory as noted on CT perfusion study. There is interval development of high density in the right temporoparietal cortex which may be due to contrast material or hemorrhage. Continued follow-up recommended.  Small interhemispheric subdural hematoma is unchanged.    01/29/2014   1. 4 mm thick posterior falx subdural hemorrhage. 2. Prominent density at the right M1/M2 junction. CTA is already planned. 3. Established infarcts in the anterior and posterior right MCA territory. No definitive acute infarct. 4. Left frontal scalp contusion.  No acute fracture.    Ct Cerebral Perfusion W/cm 01/29/2014    1. Occlusive thrombus at the right M1-M2 junction. Subjectively good pial/pial collateral circulation. 2. Perfusion findings suggest moderate acute infarct in the central right MCA territory with rim of penumbra, as above. 3. Remote infarcts at the anterior and posterior margins of the right MCA territory. 4. Thin posterior falx subdural hemorrhage.     Cerebral Angiogram  01/29/2014 S/P Bilateral common carotid arteriograms, followed by complete revascularization of RT MCA M1 occlusion usingX2 passes with the Solitaire FR 42mm x 40mm retrieval device And 10 mg of  superselective intracranial IA Integrelin. TICI 3 flow reesablished.   MRI brain   02/02/14 1. New, acute infarcts in the posterior circulation related to treated basilar thrombosis. Infarcts present in the bilateral cerebellum, upper right brainstem, mesial right temporal lobe, and minimally along the right occipital cortex. Normal flow voids within the intracranial vessels. 2. Stable appearance of recent right MCA territory infarct. 3. Stable trace falcine subdural hemorrhage.  01/31/14 1. Acute  nonhemorrhagic infarct involving the superior right temporal lobe, right insular ribbon, right frontal operculum, and posterior right frontal lobe. 2. More remote infarcts are again noted in the more anterior inferior right frontal lobe and the right parietal lobe. 3. Extensive white matter changes bilaterally likely reflect the sequela of chronic microvascular ischemia, advanced for age. 4. White matter changes extend into the brainstem. 5. Remote tender infarcts of the cerebellum bilaterally, worse on the right.  Dg Chest Port 1 View 02/09/2014 Pending 02/08/2014    1. Right PICC line stable position. 2. Congestive heart failure with interstitial edema and small right pleural effusion. Mild improvement from prior exam. 02/10/2014 Improved bibasilar atelectasis versus airspace disease.  2D Echocardiogram  EF 50-55% with no source of embolus.   A1C 6.4 and LDL 70   Physical Exam General: The patient is awake, following simple commands. The patient is moderately obese. The patient has ecchymosis over the left brow. Respiratory: Diminished  breath sounds Cardiovascular: irregular rate and rhythm, no murmurs noted Abdomen: Soft Skin: No significant peripheral edema is noted. Neurologic Exam Mental status: The patient is cooperative, follows commands. Orientated to person, place, time, situation. She is distressed by the last mucous plug. Cranial nerves: Pupils are equal, reactive to light. Difficulty with upgaze. Blnk to threat in all quadrants. Mild left facial asymmetry.  Motor: The left arm drift Sensory examination: The patient has decreased sensation on the left arm Coordination: No obvious ataxia seen on the extremities. Difficult to test. Gait and station: Gait was not tested. Reflexes: Deep tendon reflexes are symmetric.   ASSESSMENT/PLAN Ms. Summer Bradley is a 68 y.o. female with history of hypertension, stroke, afib and COPD presenting with Left-sided weakness. She did not  receive IV t-PA due to delay in arrival, ? SDH. But underwent mechanical thrombectomy of right MCA with complete vascularization of right MCA M1 occlusion with IA Integrilin. 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 Dr. Estanislado Pandy was able to extract the clot, and the patient has sustained a right pontine and midbrain stroke, small bilateral cerebellar strokes, and some extension of the infarct in the mesial temporal area on the right.   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 Infarcts embolic secondary to Afib with subtherapeutic INR on coumadin Trace falcine subdural hemorrhage.  Resultant left arm weakness and left facial droop  2D Echo  EF 50-55% no source of embolus  HgbA1c 6.4  eliquis po for VTE prophylaxis  aspirin 81 mg orally every day and warfarin prior to admission, changed to eliquis for stroke prevention  Neurologically stable   PT/OT on board and recommend CIR. Admissions coordinator is following.   Acute Respiratory Failure / COPD exacerbation   Intubated for neuro intervention-extubated 02/06/14  Tolerated well 24 hours post extubation.  Once to the floor 12/23, overnight developed more respiratory distress and concerning for aspiration  12/24 Transferred to step down unit for further monitoring  NPO - changed to dysphagia 1 diet with honey thick liquids  Another aspiration "spell" over night   At risk for of respiratory decline  Continue nebulization and steroids.  CCM on board, appreciate their help  Follow up CXR 02/10/2014  - improved aeration.  Pulmonology following.   Continue step-down level care  Atrial Fibrillation  Home meds:  Warfarin  INR subtherapeutic on admission, INR 1.18    started Cardizem and s/p d/c amiodarone.   Rate controlled.  Now on eliquis.    Hypertension  Continue to monitor  BP stable 120-140  On bisoprolol  Hyperlipidemia  Home meds:  Mevacor  LDL 70, goal < 70  On pravastatin  Continue statin on d/c  Dysphagia  Passed swallow yesterday (D1 honey)  On dysphagia diet since yesterday 12/24 but today due to concerns of aspiration, she was put on NPO except meds.  IVF d/c'd yesterday. Given NPO status - changed to dysphagia 1 with honey thick liquids, will resume at 50/hr for now  Other Stroke Risk Factors  Advanced age  Obesity, Body mass index is 31.93 kg/(m^2).   Hx stroke/TIA  Other Active Problems  COPD - home O2 at 2L  Depression, on chronic SSRI  Hypokalemia  Anemia  Leukocytosis - afebrile - Unasyn day #2  Recheck labs in a.m.  D/C foley  Trush -  fluconazole x 14 days  Hospital day # 12    Personally examined patient and images, and have documentes history, physical, neuro exam,assessment and plan as stated above.   Sarina Ill, MD Stroke Neurology [phone] [facility]       To contact Stroke Continuity provider, please refer to [email or web address] After hours, contact General Neurology

## 2014-02-10 NOTE — Discharge Instructions (Signed)

## 2014-02-11 ENCOUNTER — Inpatient Hospital Stay (HOSPITAL_COMMUNITY): Payer: Medicare HMO

## 2014-02-11 DIAGNOSIS — R062 Wheezing: Secondary | ICD-10-CM

## 2014-02-11 LAB — CBC WITH DIFFERENTIAL/PLATELET
Basophils Absolute: 0 10*3/uL (ref 0.0–0.1)
Basophils Relative: 0 % (ref 0–1)
Eosinophils Absolute: 0 10*3/uL (ref 0.0–0.7)
Eosinophils Relative: 0 % (ref 0–5)
HCT: 32.9 % — ABNORMAL LOW (ref 36.0–46.0)
Hemoglobin: 9.6 g/dL — ABNORMAL LOW (ref 12.0–15.0)
Lymphocytes Relative: 11 % — ABNORMAL LOW (ref 12–46)
Lymphs Abs: 2.2 10*3/uL (ref 0.7–4.0)
MCH: 24.2 pg — ABNORMAL LOW (ref 26.0–34.0)
MCHC: 29.2 g/dL — ABNORMAL LOW (ref 30.0–36.0)
MCV: 83.1 fL (ref 78.0–100.0)
Monocytes Absolute: 1.6 10*3/uL — ABNORMAL HIGH (ref 0.1–1.0)
Monocytes Relative: 8 % (ref 3–12)
Neutro Abs: 16.4 10*3/uL — ABNORMAL HIGH (ref 1.7–7.7)
Neutrophils Relative %: 81 % — ABNORMAL HIGH (ref 43–77)
Platelets: 658 10*3/uL — ABNORMAL HIGH (ref 150–400)
RBC: 3.96 MIL/uL (ref 3.87–5.11)
RDW: 18.8 % — ABNORMAL HIGH (ref 11.5–15.5)
WBC: 20.2 10*3/uL — ABNORMAL HIGH (ref 4.0–10.5)

## 2014-02-11 LAB — BASIC METABOLIC PANEL
Anion gap: 7 (ref 5–15)
BUN: 20 mg/dL (ref 6–23)
CO2: 29 mmol/L (ref 19–32)
Calcium: 8.4 mg/dL (ref 8.4–10.5)
Chloride: 103 mEq/L (ref 96–112)
Creatinine, Ser: 0.76 mg/dL (ref 0.50–1.10)
GFR calc Af Amer: >90 mL/min (ref >90)
GFR calc non Af Amer: 84 mL/min — ABNORMAL LOW (ref >90)
Glucose, Bld: 123 mg/dL — ABNORMAL HIGH (ref 70–99)
Potassium: 3.7 mmol/L (ref 3.5–5.1)
Sodium: 139 mmol/L (ref 135–145)

## 2014-02-11 MED ORDER — BUDESONIDE 0.5 MG/2ML IN SUSP
0.5000 mg | Freq: Two times a day (BID) | RESPIRATORY_TRACT | Status: DC
  Filled 2014-02-11 (×2): qty 2

## 2014-02-11 MED ORDER — METHYLPREDNISOLONE SODIUM SUCC 40 MG IJ SOLR
40.0000 mg | Freq: Two times a day (BID) | INTRAMUSCULAR | Status: DC
  Administered 2014-02-11 – 2014-02-12 (×2): 40 mg via INTRAVENOUS
  Filled 2014-02-11 (×4): qty 1

## 2014-02-11 NOTE — Progress Notes (Signed)
STROKE TEAM PROGRESS NOTE   HISTORY Summer Bradley is a 68 y.o. female who got up around 4:30 AM 01/29/2014 to go to the bathroom and then sat down in her chair. She is not certain if her left arm was working at that time or not. She then fell out of her chair around 5 AM and that time noticed that her left arm wasn't working. She did not realize that anything was wrong until she fell out of her chair.  She was in her normal state of health ongoing to bed the evening prior. She was last known well at 8p on 01/28/2014.  In the ER, a CT was obtained which shows evidence of a previous stroke, and a CT perfusion scan was obtained which shows a significant mismatch demonstrating an area of penumbra, though there is an smaller area of likely infarct core as well.  Patient was not administered TPA secondary to being  outside of the tPA window with question of subdural hematoma on CT. Due to mismatch, she was taken to neuro intervention where she had complete revascularization of RT MCA M1 occlusion using 2 passes with the Solitaire and 10 mg of IA Integrelin. TICI 3 flow reesablished. Afterwards, she was admitted to the neuro ICU for further evaluation and treatment.   SUBJECTIVE (INTERVAL HISTORY): She continues to have mucous plugging, followed by pulmonary.    OBJECTIVE Temp:  [97.5 F (36.4 C)-98.5 F (36.9 C)] 98.5 F (36.9 C) (12/27 0741) Pulse Rate:  [65-84] 76 (12/27 0530) Cardiac Rhythm:  [-] Atrial flutter (12/27 0317) Resp:  [20-26] 24 (12/27 0530) BP: (109-142)/(59-86) 133/86 mmHg (12/27 0317) SpO2:  [92 %-100 %] 97 % (12/27 0741) FiO2 (%):  [28 %-35 %] 28 % (12/27 0741) Weight:  [180 lb 5.4 oz (81.8 kg)] 180 lb 5.4 oz (81.8 kg) (12/27 0317)   Recent Labs Lab 02/05/14 0805 02/05/14 1631 02/05/14 2356 02/07/14 2015 02/08/14 0402  GLUCAP 146* 174* 169* 174* 112*    Recent Labs Lab 02/05/14 0220 02/06/14 0500 02/07/14 0425 02/08/14 0630 02/11/14 0500  NA 145 144 145 138  139  K 3.8 3.1* 3.5 3.3* 3.7  CL 104 100 103 97 103  CO2 31 29 32 33* 29  GLUCOSE 154* 145* 119* 105* 123*  BUN 30* 31* 31* 28* 20  CREATININE 0.55 0.76 0.74 0.76 0.76  CALCIUM 9.3 8.9 8.7 8.5 8.4  MG  --   --  2.1 2.1  --   PHOS  --   --  4.0 3.1  --    No results for input(s): AST, ALT, ALKPHOS, BILITOT, PROT, ALBUMIN in the last 168 hours.  Recent Labs Lab 02/05/14 0220 02/06/14 0500 02/07/14 0425 02/08/14 0630 02/11/14 0500  WBC 10.4 12.0* 12.3* 14.0* 20.2*  NEUTROABS  --   --   --   --  16.4*  HGB 8.7* 9.0* 8.8* 9.9* 9.6*  HCT 29.6* 30.7* 30.8* 33.4* 32.9*  MCV 82.2 81.6 82.4 83.5 83.1  PLT 404* 504* 504* 574* 658*   No results for input(s): CKTOTAL, CKMB, CKMBINDEX, TROPONINI in the last 168 hours. No results for input(s): LABPROT, INR in the last 72 hours. No results for input(s): COLORURINE, LABSPEC, [place], GLUCOSEU, HGBUR, BILIRUBINUR, KETONESUR, PROTEINUR, UROBILINOGEN, NITRITE, LEUKOCYTESUR in the last 72 hours.  Invalid input(s): APPERANCEUR     Component Value Date/Time   CHOL 114 01/30/2014 0337   TRIG 102 01/30/2014 0337   HDL 24* 01/30/2014 0337   CHOLHDL 4.8 01/30/2014 8453  VLDL 20 01/30/2014 0337   LDLCALC 70 01/30/2014 0337   Lab Results  Component Value Date   HGBA1C 6.4* 01/30/2014      Component Value Date/Time   LABOPIA NONE DETECTED 01/29/2014 0659   COCAINSCRNUR NONE DETECTED 01/29/2014 0659   LABBENZ NONE DETECTED 01/29/2014 0659   AMPHETMU NONE DETECTED 01/29/2014 0659   THCU NONE DETECTED 01/29/2014 0659   LABBARB NONE DETECTED 01/29/2014 0659    No results for input(s): ETH in the last 168 hours.   Ct Head Wo Contrast  02/01/2014  Stat CT scan shows no acute changes in the brain, but there appears to be a thrombus in the basilar artery that is new.  01/30/2014    1. Resolved right MCA territory hyperdensity present at 1056 hrs yesterday, most likely was post Neurointervention parenchymal contrast staining. 2. Mild cytotoxic  edema suspected in that same right MCA territory. Underlying chronic ischemic changes. 3. Stable parafalcine subdural hematoma. No new intracranial hemorrhage or acute mass effect. 4. No new intracranial abnormality.    01/29/2014    Acute infarct right MCA territory as noted on CT perfusion study. There is interval development of high density in the right temporoparietal cortex which may be due to contrast material or hemorrhage. Continued follow-up recommended.  Small interhemispheric subdural hematoma is unchanged.    01/29/2014   1. 4 mm thick posterior falx subdural hemorrhage. 2. Prominent density at the right M1/M2 junction. CTA is already planned. 3. Established infarcts in the anterior and posterior right MCA territory. No definitive acute infarct. 4. Left frontal scalp contusion.  No acute fracture.    Ct Cerebral Perfusion W/cm 01/29/2014    1. Occlusive thrombus at the right M1-M2 junction. Subjectively good pial/pial collateral circulation. 2. Perfusion findings suggest moderate acute infarct in the central right MCA territory with rim of penumbra, as above. 3. Remote infarcts at the anterior and posterior margins of the right MCA territory. 4. Thin posterior falx subdural hemorrhage.     Cerebral Angiogram  01/29/2014 S/P Bilateral common carotid arteriograms, followed by complete revascularization of RT MCA M1 occlusion usingX2 passes with the Solitaire FR 65mm x 26mm retrieval device And 10 mg of  superselective intracranial IA Integrelin. TICI 3 flow reesablished.   MRI brain   02/02/14 1. New, acute infarcts in the posterior circulation related to treated basilar thrombosis. Infarcts present in the bilateral cerebellum, upper right brainstem, mesial right temporal lobe, and minimally along the right occipital cortex. Normal flow voids within the intracranial vessels. 2. Stable appearance of recent right MCA territory infarct. 3. Stable trace falcine subdural  hemorrhage.  01/31/14 1. Acute nonhemorrhagic infarct involving the superior right temporal lobe, right insular ribbon, right frontal operculum, and posterior right frontal lobe. 2. More remote infarcts are again noted in the more anterior inferior right frontal lobe and the right parietal lobe. 3. Extensive white matter changes bilaterally likely reflect the sequela of chronic microvascular ischemia, advanced for age. 4. White matter changes extend into the brainstem. 5. Remote tender infarcts of the cerebellum bilaterally, worse on the right.  Dg Chest Port 1 View 02/08/2014    1. Right PICC line stable position. 2. Congestive heart failure with interstitial edema and small right pleural effusion. Mild improvement from prior exam. 02/10/2014 Improved bibasilar atelectasis versus airspace disease. 02/11/2014 - pending  2D Echocardiogram  EF 50-55% with no source of embolus.   A1C 6.4 and LDL 70   Physical Exam General: The patient is awake, following commands.  The patient is obese. The patient has ecchymosis and abrasions around the left eye.  Respiratory: Diminished breath sounds at the bilateral bases with rhonchi and wheezing Cardiovascular: irregular rate and rhythm, no murmurs noted Abdomen: Soft Skin: No significant peripheral edema is noted. Neurologic Exam Mental status: The patient is cooperative, follows commands. Orientated to person, place, time, situation. She is distressed by the mucous plugs and her respiratory condition. Cranial nerves: Pupils are equal, reactive to light. Difficulty with upgaze. Blnk to threat in all quadrants, VFF. Mild left facial asymmetry.  Motor: mild weakness left arm, left arm drift. Bilateral Hip flexion mild weakness. Otherwise intact.  Sensory examination: The patient has decreased sensation on the left arm Coordination: No obvious ataxia seen on the extremities. Difficult to test. Intact FTN, difficult to test HTS.  Gait and station:  Gait was not tested. Reflexes: Deep tendon reflexes are brisk throughout.   ASSESSMENT/PLAN Ms. Daun Rens is a 68 y.o. female with history of hypertension, stroke, afib and COPD presenting with Left-sided weakness. She did not receive IV t-PA due to delay in arrival, ? SDH. But underwent mechanical thrombectomy of right MCA with complete vascularization of right MCA M1 occlusion with IA Integrilin. 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 Dr. Estanislado Pandy was able to extract the clot, and the patient has sustained a right pontine and midbrain stroke, small bilateral cerebellar strokes, and some extension of the infarct in the mesial temporal area on the right.   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 Infarcts embolic secondary to Afib with subtherapeutic INR on coumadin Trace falcine subdural hemorrhage.  Resultant left arm weakness and left facial droop  2D Echo  EF 50-55% no source of embolus  HgbA1c 6.4  eliquis po for VTE prophylaxis  aspirin 81 mg orally every day and warfarin prior to admission, changed to eliquis for stroke prevention  Neurologically stable   PT/OT on board and recommend CIR. Admissions coordinator is following.   Acute Respiratory Failure / COPD exacerbation   Intubated for neuro intervention-extubated 02/06/14  Tolerated well 24 hours post extubation.  Once to the floor 12/23, overnight developed more respiratory distress and concerning for aspiration  12/24 Transferred to step down unit for further monitoring  NPO - changed to dysphagia 1 diet with honey thick liquids. NS at 50 cc / hr  Continue nebulization and steroids.  CCM on board, appreciate their help  Follow up CXR - pending  -   Pulmonology following - Suspect aspiration pneumonitis  Continue step-down level care  Atrial Fibrillation  Home meds:  Warfarin  INR subtherapeutic on admission, INR 1.18   started Cardizem and s/p d/c amiodarone.   Rate controlled.  Now on eliquis.    Hypertension  Continue to monitor  BP stable 120-140  On bisoprolol  Hyperlipidemia  Home meds:  Mevacor  LDL 70, goal < 70  On pravastatin  Continue statin on d/c  Dysphagia  Passed swallow yesterday (D1 honey)  On dysphagia diet since yesterday 12/24 but today due to concerns of aspiration, she was put on NPO except meds.  NPO status changed to dysphagia 1 with honey thick liquids   Other Stroke Risk Factors  Advanced age  Obesity, Body mass index is 32.98 kg/(m^2).   Hx stroke/TIA  Other Active Problems  COPD -  home O2 at 2L  Depression, on chronic SSRI  Hypokalemia - improved 3.7 today  Anemia  Leukocytosis - on solumedrol - afebrile   D/C foley  Trush - fluconazole x 14 days  Hospital day # 13    Personally examined patient and images, and have documentes history, physical, neuro exam,assessment and plan as stated above.   Sarina Ill, MD Stroke Neurology [phone] [facility]       To contact Stroke Continuity provider, please refer to [email or web address] After hours, contact General Neurology

## 2014-02-11 NOTE — Progress Notes (Signed)
PULMONARY / CRITICAL CARE MEDICINE   Name: Summer Bradley MRN: [id number] DOB: July 17, 1945    ADMISSION DATE:  01/29/2014 CONSULTATION DATE:  01/29/2014  REFERRING MD : Neuro  CHIEF COMPLAINT: Left sided weakness  HISTORY OF PRESENT ILLNESS:   82 yowf  with COPD quit smoking 12 y pta , PAF admitted with acute CVA to Stroke Service after clot retrieval from R MCA  STUDIES/SIGNIFICANT EVENTS: 12/14 CTA head: Occlusive thrombus at the right M1-M2 junction. Subjectively good pial/pial collateral circulation. Perfusion findings suggest moderate acute infarct in the central right MCA territory with rim of penumbra, as above. Remote infarcts at the anterior and posterior margins of the right MCA territory. Thin posterior falx subdural hemorrhage  12/14 admitted to stroke  service after Neuro IR clot retrieval from R MCA. Intubated for procedure. PCCM consultation 12/14 CT head: Acute infarct right MCA territory as noted on CT perfusion study. There is interval development of high density in the right  temporoparietal cortex which may be due to contrast material or hemorrhage.  12/14 Echocardiogram: Lvef 50-55% 12/15 Ct head: Mild cytotoxic edema suspected in that same right MCA territory. Underlying chronic ischemic changes. Stable parafalcine subdural hematoma. No new intracranial hemorrhage or acute mass effect. No new intracranial abnormality 12/15 Extubated 12/16 Transferred out of ICU. PCCM signed off 12/16 MRI brain: Acute nonhemorrhagic infarct involving the superior right temporal lobe, right insular ribbon, right frontal operculum, and posterior right frontal lobe. More remote infarcts are again noted in the more anterior inferior right frontal lobe and the right parietal lobe. Extensive white matter changes bilaterally likely reflect the sequela of chronic microvascular ischemia, advanced for age. White matter changes extend into the brainstem. Remote tender infarcts of the cerebellum  bilaterally, worse on the right. 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 Early edema suspected in the thalami right greater than left  12/17 Repeat cerebral angiogram by Neuro IR with clot retrieval. Remained intubated after procedure 12/17 CT head: Large RIGHT MCA territory infarct extending to RIGHT basal ganglia. Underlying atrophy and small vessel chronic ischemic changes. Stable parafalcine subdural hematoma. No new intracranial abnormalities  12/18 failed SBT due to tachypnea and low Vt. Wheezing noted 12/18 MRI brain: New, acute infarcts in the posterior circulation related to treated basilar thrombosis. Infarcts present in the bilateral cerebellum, upper right brainstem, mesial right temporal lobe, and minimally along the right occipital cortex 12/22  extubation.    12/22 failed swallow eval. 12/25 ST recs DI diet/ honey thickened           Tubes/Lines: OETT 12/14>>12/15, 12/17 >>12/22 RUE PICC 12/18 >>  SUBJECTIVE:  Continues to have problems with retained secretions/ cough/ choking early am with occ nts   VITAL SIGNS: Temp:  [97.5 F (36.4 C)-98.5 F (36.9 C)] 98.5 F (36.9 C) (12/27 0741) Pulse Rate:  [65-84] 76 (12/27 0530) Resp:  [20-26] 24 (12/27 0530) BP: (109-142)/(59-86) 133/86 mmHg (12/27 0317) SpO2:  [92 %-100 %] 97 % (12/27 0741) FiO2 (%):  [28 %-35 %] 28 % (12/27 0741) Weight:  [180 lb 5.4 oz (81.8 kg)] 180 lb 5.4 oz (81.8 kg) (12/27 0317)   HEMODYNAMICS: VENTILATOR SETTINGS: Vent Mode:  [-]  FiO2 (%):  [28 %-35 %] 28 % INTAKE / OUTPUT:  Intake/Output Summary (Last 24 hours) at 02/11/14 0949 Last data filed at 02/11/14 0600  Gross per 24 hour  Intake   2680 ml  Output    325 ml  Net  2355 ml    PHYSICAL EXAMINATION: General:  Chronically ill appearing - no increased wob   HEENT:  Mucosa somewhat dry Cardiovascular:  IRIR,   Lungs: Diminished breath sounds / rattling congested sounding  cough/ not using flutter  Abdomen:  Obese, large midline scar Musculoskeletal:  intact Skin:  Warm , no lower ext edema  LABS:  Recent Labs Lab 02/07/14 0425 02/08/14 0630 02/11/14 0500  NA 145 138 139  K 3.5 3.3* 3.7  CL 103 97 103  CO2 32 33* 29  BUN 31* 28* 20  CREATININE 0.74 0.76 0.76  GLUCOSE 119* 105* 123*    Recent Labs Lab 02/07/14 0425 02/08/14 0630 02/11/14 0500  HGB 8.8* 9.9* 9.6*  HCT 30.8* 33.4* 32.9*  WBC 12.3* 14.0* 20.2*  PLT 504* 574* 658*    Dg Chest Port 1 View  02/10/2014   CLINICAL DATA:  Respiratory failure  EXAM: PORTABLE CHEST - 1 VIEW  COMPARISON:  02/08/2014  FINDINGS: Stable PICC. Stable moderate cardiomegaly. Bibasilar opacities improved. Upper lungs clear. No pneumothorax.  IMPRESSION: Improved bibasilar atelectasis versus airspace disease.   Electronically Signed   By: Maryclare Bean M.D.   On: 02/10/2014 07:49     ASSESSMENT / PLAN:  Suspect aspiration pneumonitis Airway plugging 2/2 Aspiration/ retained secretions/ poor cough mechanics Sputum + moraxella 12/20 VDRF (resolved)  COPD with bronchospasm / noct "wheeze" and "choking" likely upper airway Suspect mild pulm edema(better with diuresis) - Titrate O2 for sats 90-94% - Humidified O2 - Cont scheduled and PRN levalbuterol nebs - increased solumedrol to 40 q12 12/27 to see if helps airway issues (doubt it will since mostly upper airway)  And try off budesonide - Nasotracheal suctioning PRN - may aggravate upper airway instability so need to use this conservatively   - IV Unasyn for aspiration12/24 > Not clear we have an endpoint here as recurrent chronic asp will be an ongoing problem> so try d/c 12/27 and follow secretions/ temp curve/ cxr's       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

## 2014-02-12 ENCOUNTER — Encounter: Payer: Self-pay | Admitting: Internal Medicine

## 2014-02-12 DIAGNOSIS — J441 Chronic obstructive pulmonary disease with (acute) exacerbation: Secondary | ICD-10-CM | POA: Insufficient documentation

## 2014-02-12 DIAGNOSIS — B370 Candidal stomatitis: Secondary | ICD-10-CM | POA: Insufficient documentation

## 2014-02-12 DIAGNOSIS — J9811 Atelectasis: Secondary | ICD-10-CM

## 2014-02-12 DIAGNOSIS — J690 Pneumonitis due to inhalation of food and vomit: Secondary | ICD-10-CM

## 2014-02-12 LAB — BASIC METABOLIC PANEL
Anion gap: 8 (ref 5–15)
BUN: 19 mg/dL (ref 6–23)
CO2: 28 mmol/L (ref 19–32)
Calcium: 8.6 mg/dL (ref 8.4–10.5)
Chloride: 104 mEq/L (ref 96–112)
Creatinine, Ser: 0.67 mg/dL (ref 0.50–1.10)
GFR calc Af Amer: >90 mL/min (ref >90)
GFR calc non Af Amer: 88 mL/min — ABNORMAL LOW (ref >90)
Glucose, Bld: 167 mg/dL — ABNORMAL HIGH (ref 70–99)
Potassium: 3.9 mmol/L (ref 3.5–5.1)
Sodium: 140 mmol/L (ref 135–145)

## 2014-02-12 LAB — BRAIN NATRIURETIC PEPTIDE: B Natriuretic Peptide: 534.2 pg/mL — ABNORMAL HIGH (ref 0.0–100.0)

## 2014-02-12 LAB — CBC
HCT: 33.2 % — ABNORMAL LOW (ref 36.0–46.0)
Hemoglobin: 9.5 g/dL — ABNORMAL LOW (ref 12.0–15.0)
MCH: 23.3 pg — ABNORMAL LOW (ref 26.0–34.0)
MCHC: 28.6 g/dL — ABNORMAL LOW (ref 30.0–36.0)
MCV: 81.6 fL (ref 78.0–100.0)
Platelets: 628 10*3/uL — ABNORMAL HIGH (ref 150–400)
RBC: 4.07 MIL/uL (ref 3.87–5.11)
RDW: 19 % — ABNORMAL HIGH (ref 11.5–15.5)
WBC: 17.9 10*3/uL — ABNORMAL HIGH (ref 4.0–10.5)

## 2014-02-12 MED ORDER — FUROSEMIDE 10 MG/ML IJ SOLN
40.0000 mg | Freq: Once | INTRAMUSCULAR | Status: AC
  Administered 2014-02-12: 40 mg via INTRAVENOUS
  Filled 2014-02-12: qty 4

## 2014-02-12 MED ORDER — METHYLPREDNISOLONE SODIUM SUCC 40 MG IJ SOLR
40.0000 mg | Freq: Every day | INTRAMUSCULAR | Status: DC
  Administered 2014-02-13: 40 mg via INTRAVENOUS
  Filled 2014-02-12: qty 1

## 2014-02-12 NOTE — Progress Notes (Signed)
STROKE TEAM PROGRESS NOTE   HISTORY Isola Mehlman is a 68 y.o. female who got up around 4:30 AM 01/29/2014 to go to the bathroom and then sat down in her chair. She is not certain if her left arm was working at that time or not. She then fell out of her chair around 5 AM and that time noticed that her left arm wasn't working. She did not realize that anything was wrong until she fell out of her chair.  She was in her normal state of health ongoing to bed the evening prior. She was last known well at 8p on 01/28/2014.  In the ER, a CT was obtained which shows evidence of a previous stroke, and a CT perfusion scan was obtained which shows a significant mismatch demonstrating an area of penumbra, though there is an smaller area of likely infarct core as well.  Patient was not administered TPA secondary to being  outside of the tPA window with question of subdural hematoma on CT. Due to mismatch, she was taken to neuro intervention where she had complete revascularization of RT MCA M1 occlusion using 2 passes with the Solitaire and 10 mg of IA Integrelin. TICI 3 flow reesablished. Afterwards, she was admitted to the neuro ICU for further evaluation and treatment.   SUBJECTIVE (INTERVAL HISTORY):  No family at bedside. She is sitting in chair and clinically improved. However, she continues to have mucous plugging, followed by pulmonary. She is on honey sick liquid dysphagia diet 1.   OBJECTIVE Temp:  [96.9 F (36.1 C)-97.9 F (36.6 C)] 97.9 F (36.6 C) (12/28 1648) Pulse Rate:  [64-98] 72 (12/28 1935) Cardiac Rhythm:  [-] Atrial flutter (12/28 1234) Resp:  [20-25] 22 (12/28 1935) BP: (106-146)/(67-86) 127/68 mmHg (12/28 1935) SpO2:  [89 %-99 %] 98 % (12/28 1935) FiO2 (%):  [28 %] 28 % (12/28 1935)   Recent Labs Lab 02/05/14 2356 02/07/14 2015 02/08/14 0402  GLUCAP 169* 174* 112*    Recent Labs Lab 02/06/14 0500 02/07/14 0425 02/08/14 0630 02/11/14 0500 02/12/14 0548  NA 144 145 138  139 140  K 3.1* 3.5 3.3* 3.7 3.9  CL 100 103 97 103 104  CO2 29 32 33* 29 28  GLUCOSE 145* 119* 105* 123* 167*  BUN 31* 31* 28* 20 19  CREATININE 0.76 0.74 0.76 0.76 0.67  CALCIUM 8.9 8.7 8.5 8.4 8.6  MG  --  2.1 2.1  --   --   PHOS  --  4.0 3.1  --   --    No results for input(s): AST, ALT, ALKPHOS, BILITOT, PROT, ALBUMIN in the last 168 hours.  Recent Labs Lab 02/06/14 0500 02/07/14 0425 02/08/14 0630 02/11/14 0500 02/12/14 0548  WBC 12.0* 12.3* 14.0* 20.2* 17.9*  NEUTROABS  --   --   --  16.4*  --   HGB 9.0* 8.8* 9.9* 9.6* 9.5*  HCT 30.7* 30.8* 33.4* 32.9* 33.2*  MCV 81.6 82.4 83.5 83.1 81.6  PLT 504* 504* 574* 658* 628*   No results for input(s): CKTOTAL, CKMB, CKMBINDEX, TROPONINI in the last 168 hours. No results for input(s): LABPROT, INR in the last 72 hours. No results for input(s): COLORURINE, LABSPEC, [place], GLUCOSEU, HGBUR, BILIRUBINUR, KETONESUR, PROTEINUR, UROBILINOGEN, NITRITE, LEUKOCYTESUR in the last 72 hours.  Invalid input(s): APPERANCEUR     Component Value Date/Time   CHOL 114 01/30/2014 0337   TRIG 102 01/30/2014 0337   HDL 24* 01/30/2014 0337   CHOLHDL 4.8 01/30/2014 2409  VLDL 20 01/30/2014 0337   LDLCALC 70 01/30/2014 0337   Lab Results  Component Value Date   HGBA1C 6.4* 01/30/2014      Component Value Date/Time   LABOPIA NONE DETECTED 01/29/2014 0659   COCAINSCRNUR NONE DETECTED 01/29/2014 0659   LABBENZ NONE DETECTED 01/29/2014 0659   AMPHETMU NONE DETECTED 01/29/2014 0659   THCU NONE DETECTED 01/29/2014 0659   LABBARB NONE DETECTED 01/29/2014 0659    No results for input(s): ETH in the last 168 hours.   Ct Head Wo Contrast  02/01/2014  Stat CT scan shows no acute changes in the brain, but there appears to be a thrombus in the basilar artery that is new.  01/30/2014    1. Resolved right MCA territory hyperdensity present at 1056 hrs yesterday, most likely was post Neurointervention parenchymal contrast staining. 2. Mild  cytotoxic edema suspected in that same right MCA territory. Underlying chronic ischemic changes. 3. Stable parafalcine subdural hematoma. No new intracranial hemorrhage or acute mass effect. 4. No new intracranial abnormality.    01/29/2014    Acute infarct right MCA territory as noted on CT perfusion study. There is interval development of high density in the right temporoparietal cortex which may be due to contrast material or hemorrhage. Continued follow-up recommended.  Small interhemispheric subdural hematoma is unchanged.    01/29/2014   1. 4 mm thick posterior falx subdural hemorrhage. 2. Prominent density at the right M1/M2 junction. CTA is already planned. 3. Established infarcts in the anterior and posterior right MCA territory. No definitive acute infarct. 4. Left frontal scalp contusion.  No acute fracture.    Ct Cerebral Perfusion W/cm 01/29/2014    1. Occlusive thrombus at the right M1-M2 junction. Subjectively good pial/pial collateral circulation. 2. Perfusion findings suggest moderate acute infarct in the central right MCA territory with rim of penumbra, as above. 3. Remote infarcts at the anterior and posterior margins of the right MCA territory. 4. Thin posterior falx subdural hemorrhage.     Cerebral Angiogram  01/29/2014 S/P Bilateral common carotid arteriograms, followed by complete revascularization of RT MCA M1 occlusion usingX2 passes with the Solitaire FR 48mm x 51mm retrieval device And 10 mg of  superselective intracranial IA Integrelin. TICI 3 flow reesablished.   MRI brain   02/02/14 1. New, acute infarcts in the posterior circulation related to treated basilar thrombosis. Infarcts present in the bilateral cerebellum, upper right brainstem, mesial right temporal lobe, and minimally along the right occipital cortex. Normal flow voids within the intracranial vessels. 2. Stable appearance of recent right MCA territory infarct. 3. Stable trace falcine subdural  hemorrhage.  01/31/14 1. Acute nonhemorrhagic infarct involving the superior right temporal lobe, right insular ribbon, right frontal operculum, and posterior right frontal lobe. 2. More remote infarcts are again noted in the more anterior inferior right frontal lobe and the right parietal lobe. 3. Extensive white matter changes bilaterally likely reflect the sequela of chronic microvascular ischemia, advanced for age. 4. White matter changes extend into the brainstem. 5. Remote tender infarcts of the cerebellum bilaterally, worse on the right.  Dg Chest Port 1 View 02/08/2014    1. Right PICC line stable position. 2. Congestive heart failure with interstitial edema and small right pleural effusion. Mild improvement from prior exam. 02/10/2014 Improved bibasilar atelectasis versus airspace disease. 02/11/2014 - pending  2D Echocardiogram  EF 50-55% with no source of embolus.   A1C 6.4 and LDL 70   Physical Exam General: The patient is awake, following commands.  The patient is obese. The patient has ecchymosis and abrasions around the left eye.  Respiratory: Diminished breath sounds at the bilateral bases with rhonchi and wheezing Cardiovascular: irregular rate and rhythm, no murmurs noted Abdomen: Soft Skin: No significant peripheral edema is noted. Neurologic Exam Mental status: The patient is cooperative, follows commands. Orientated to person, place, time, situation. She is distressed by the mucous plugs and her respiratory condition. Cranial nerves: Pupils are equal, reactive to light. Difficulty with upgaze. Blnk to threat in all quadrants, VFF. Mild left facial asymmetry.  Motor: mild weakness left arm, left arm drift. Bilateral Hip flexion mild weakness. Otherwise intact.  Sensory examination: The patient has decreased sensation on the left arm Coordination: No obvious ataxia seen on the extremities. Difficult to test. Intact FTN, difficult to test HTS.  Gait and station:  Gait was not tested. Reflexes: Deep tendon reflexes are brisk throughout.   ASSESSMENT/PLAN Ms. Evadean Sproule is a 68 y.o. female with history of hypertension, stroke, afib and COPD presenting with Left-sided weakness. She did not receive IV t-PA due to delay in arrival, ? SDH. But underwent mechanical thrombectomy of right MCA with complete vascularization of right MCA M1 occlusion with IA Integrilin. 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 Dr. Estanislado Pandy was able to extract the clot, and the patient has sustained a right pontine and midbrain stroke, small bilateral cerebellar strokes, and some extension of the infarct in the mesial temporal area on the right.   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 Infarcts embolic secondary to Afib with subtherapeutic INR on coumadin.  Resultant left arm weakness and left facial droop  2D Echo  EF 50-55% no source of embolus  HgbA1c 6.4  eliquis po for VTE prophylaxis  aspirin 81 mg orally every day and warfarin prior to admission, changed to eliquis for stroke prevention  Neurologically stable   PT/OT on board and recommend CIR. Admissions coordinator is following.   Acute Respiratory Failure / COPD exacerbation   Intubated for neuro intervention-extubated 02/06/14  Tolerated well 24 hours post extubation.  Once to the floor 12/23, overnight developed more respiratory distress and concerning for aspiration  12/24 Transferred to step down unit for further monitoring  On dysphagia 1 diet with honey thick liquids. Off IVF  Continue nebulization and decrease steroids to Qday.  CCM on board, appreciate their help  Continue step-down level care  COPD  exacerbation  Continue nebulization and steroids  CCM on board  O2 PRN  Sat 90-94%  Atrial Fibrillation / A flutter with AV block  Home meds:  Warfarin  INR subtherapeutic on admission, INR 1.18   started Cardizem and s/p d/c amiodarone.   Rate controlled.  Now on eliquis for stroke prevention   Hypertension  Continue to monitor  BP stable 120-140  On bisoprolol  Hyperlipidemia  Home meds:  Mevacor  LDL 70, goal < 70  On pravastatin  Continue statin on d/c  Dysphagia  Due to concerns of aspiration, she was put on NPO except meds.  Now status changed to dysphagia 1 with honey thick liquids  Trush  Fluconazole for 14 days  Other Stroke Risk Factors  Advanced age  Obesity, Body mass index is 32.98 kg/(m^2).   Hx stroke/TIA  Other Active Problems  Depression, on chronic SSRI  Hypokalemia - improved 3.7 today  Anemia  Leukocytosis - on solumedrol - afebrile  Hospital day # 14    Rosalin Hawking, MD PhD Stroke Neurology 02/12/2014 8:50 PM   To contact Stroke Continuity provider, please refer to [email or web address] After hours, contact General Neurology

## 2014-02-12 NOTE — Clinical Social Work Note (Signed)
CSW spoke with Eugenia from [facility]. Per Eugenia pt's insurance denied CIR. CSW followed up with the pt's daughter regarding bed offers. Pt daughter reported [facility] as the 1st choice and [facility] as the 2nd choice. CSW contacted Leonette Nutting at [facility] to review the pt's clinicals as [facility] did not offer a bed, but [facility] did offer a bed. CSW will continue to follow and assist pt.   [place], MSW, [place]

## 2014-02-12 NOTE — Clinical Documentation Improvement (Signed)
Please clarify if you agree pt with A-Flutter and AV block or other diagnoses per EKG 02/01/17 and document in pn or d/c summary  Possible Clinical Conditions? ____________________  ____________________ ____________________ _______Other Condition__________________ _______Cannot Clinically Determine   Supporting Information: Risk Factors: CVA, Acute Resp Fail, ASP PNA, SDH, COPD Signs & Symptoms: Diagnostics: Treatment  nicardipine (CARDENE) 20mg  in 0.86% saline 234ml IV infusion  diltiazem (CARDIZEM) 1 mg/mL load via infusion 10 mg   eptifibatide (INTEGRILIN) injection  nitroGLYCERIN 100 MCG/ML intra-arterial injection  metoprolol (LOPRESSOR) injection 2.5-5 mg  Thank You, Heloise Beecham ,RN Clinical Documentation Specialist:  [facility]

## 2014-02-12 NOTE — Progress Notes (Signed)
Physical Therapy Treatment Patient Details Name: Zipporah Finamore MRN: [id number] DOB: February 03, 1946 Today's Date: 02/12/2014    History of Present Illness 68 yo female admitted from [facility] with L side weakness, vision loss and s/p fall hitting L side of face. intubated 01/29/14; 12/14 R MCA clot retrieval with revascularixation. pt extubated 01/30/14 PMH: HTN, CVA, AFIB, COPD, abdominal surg.  pt lethargic and found to have extension, re-intubated on 12/18.      PT Comments    Pt eager for participation in mobility and needs cueing for safety and impulsivity.  Pt able to perform standing pre-gait activity maintaining sats in mid 90's while on 5L via face mask.  Will continue to follow along.    Follow Up Recommendations  SNF     Equipment Recommendations  None recommended by PT    Recommendations for Other Services       Precautions / Restrictions Precautions Precautions: Fall Precaution Comments: pt on 5L face mask. Restrictions Weight Bearing Restrictions: No    Mobility  Bed Mobility               General bed mobility comments: pt sitting in recliner.    Transfers Overall transfer level: Needs assistance Equipment used: Rolling walker (2 wheeled) Transfers: Sit to/from Stand Sit to Stand: Min assist         General transfer comment: cues for UE use and safe technique.  pt impulsive and attempts to stand once PT mentions getting up.  Repeated transfer x2.    Ambulation/Gait                 Stairs            Wheelchair Mobility    Modified Rankin (Stroke Patients Only)       Balance Overall balance assessment: Needs assistance Sitting-balance support: Single extremity supported;Feet supported Sitting balance-Leahy Scale: Poor Sitting balance - Comments: pt able to work on wt shifting and reaching though requires intermittant MinA to prevent LOB.     Standing balance support: Bilateral upper extremity supported Standing balance-Leahy Scale:  Poor Standing balance comment: pt able to stand with RW x13mins x2 trials.  pt able to perform pre-gait activity with steps in place and MinA.                      Cognition Arousal/Alertness: Awake/alert Behavior During Therapy: Impulsive Overall Cognitive Status: Impaired/Different from baseline Area of Impairment: Attention;Memory;Following commands;Safety/judgement;Awareness;Problem solving   Current Attention Level: Selective Memory: Decreased short-term memory;Decreased recall of precautions Following Commands: Follows one step commands consistently;Follows multi-step commands inconsistently Safety/Judgement: Decreased awareness of safety;Decreased awareness of deficits Awareness: Emergent Problem Solving: Slow processing;Difficulty sequencing;Requires verbal cues;Requires tactile cues General Comments: pt impulsive, but follows all simple directions.      Exercises      General Comments        Pertinent Vitals/Pain Pain Assessment: No/denies pain    Home Living                      Prior Function            PT Goals (current goals can now be found in the care plan section) Acute Rehab PT Goals Patient Stated Goal: to live by myself again PT Goal Formulation: With patient/family Time For Goal Achievement: 02/19/14 Potential to Achieve Goals: Good Progress towards PT goals: Progressing toward goals    Frequency  Min 4X/week    PT Plan Discharge plan needs  to be updated    Co-evaluation             End of Session Equipment Utilized During Treatment: Gait belt;Oxygen Activity Tolerance: Patient tolerated treatment well Patient left: in chair;with call bell/phone within reach;with family/visitor present     Time: 0812-0830 PT Time Calculation (min) (ACUTE ONLY): 18 min  Charges:  $Therapeutic Activity: 8-22 mins                    G CodesCatarina Hartshorn, [place] 02/12/2014, 9:53 AM

## 2014-02-12 NOTE — Progress Notes (Addendum)
Occupational Therapy Treatment Patient Details Name: Summer Bradley MRN: [id number] DOB: 11-May-1945 Today's Date: 02/12/2014    History of present illness 68 yo female admitted from [facility] with L side weakness, vision loss and s/p fall hitting L side of face. intubated 01/29/14; 12/14 R MCA clot retrieval with revascularixation. pt extubated 01/30/14 PMH: HTN, CVA, AFIB, COPD, abdominal surg.  pt lethargic and found to have extension, re-intubated on 12/18.     OT comments  Pt progressing. Pt impulsive in session. Continue to recommend CIR for rehab. *see vitals below  Follow Up Recommendations  CIR;Supervision/Assistance - 24 hour    Equipment Recommendations  3 in 1 bedside comode    Recommendations for Other Services      Precautions / Restrictions Precautions Precautions: Fall Restrictions Weight Bearing Restrictions: No       Mobility Bed Mobility               General bed mobility comments: not assessed-sitting in recliner  Transfers Overall transfer level: Needs assistance Equipment used: Rolling walker (2 wheeled) Transfers: Sit to/from [facility] Sit to Stand: Mod assist Stand pivot transfers: Min assist       General transfer comment: cues for technique. assist with balance         ADL Overall ADL's : Needs assistance/impaired     Grooming: Wash/dry face;Brushing hair;Standing;Maximal assistance; +2 for safety/equipment Grooming Details (indicate cue type and reason): pt walker turned over at sink and she attempted to pick it up; Assist to prevent fall/balance ; pt starting using hands to comb through hair instead of getting comb to do so             Lower Body Dressing: Moderate assistance;Sitting/lateral leans (donning socks)   Toilet Transfer: Moderate assistance;+2 for safety/equipment;RW;BSC;Ambulation;Stand-pivot;Minimal assistance   Toileting- Clothing Manipulation and Hygiene: Moderate assistance (standing-gown; did not  have BM so did not perform hygiene)       Functional mobility during ADLs: Minimal assistance;+2 for safety/equipment;Rolling walker General ADL Comments: Told daughter to encourage pt to be using left hand. Pt having a hard time using LUE in session. Educated on deep breathing technique and rest breaks taken in session due to decreased O2 and increased HR. Nurse told OT that pt could be taken off of face mask for session (see vitals). Pt impulsive in session.      Vision                     Perception     Praxis      Cognition   Behavior During Therapy: Impulsive Overall Cognitive Status: Impaired/Different from baseline Area of Impairment: Orientation;Safety/judgement;Problem solving Orientation Level: Disoriented to;Place Safety/Judgement: Decreased awareness of safety;Decreased awareness of deficits Problem Solving: Slow processing;Requires verbal cues      Extremity/Trunk Assessment               Exercises     Shoulder Instructions       General Comments      Pertinent Vitals/ Pain       Pain Assessment: No/denies pain; HR in 130's-150's in session. O2 on RA in 80's-90's. Educated on deep breathing technique and O2 would trend up. Placed back on breathing mask at end of session.  Home Living  Prior Functioning/Environment              Frequency Min 2X/week     Progress Toward Goals  OT Goals(current goals can now be found in the care plan section)  Progress towards OT goals: Progressing toward goals  Acute Rehab OT Goals Patient Stated Goal: not stated OT Goal Formulation: With patient Time For Goal Achievement: 02/22/14 Potential to Achieve Goals: Good ADL Goals Pt Will Perform Grooming: with min assist;standing Pt Will Perform Upper Body Bathing: with set-up;with supervision;sitting Pt Will Perform Upper Body Dressing: with set-up;with supervision;sitting Pt Will Transfer to  Toilet: with supervision;ambulating;bedside commode  Plan Discharge plan remains appropriate    Co-evaluation                 End of Session Equipment Utilized During Treatment: Gait belt;Rolling walker;Other (comment) (O2 placed back on at end of session)   Activity Tolerance Patient limited by fatigue;Other (comment) (high HR)   Patient Left in chair;with call bell/phone within reach;with family/visitor present   Nurse Communication Mobility status;Other (comment) (small skin tear on left hand-nurse addressed this;O2 sats HR)        Time: 8299-3716 OT Time Calculation (min): 20 min  Charges: OT General Charges $OT Visit: 1 Procedure OT Treatments $Self Care/Home Management : 8-22 mins  Benito Mccreedy OTR/L [phone] 02/12/2014, 11:32 AM

## 2014-02-12 NOTE — Progress Notes (Signed)
PULMONARY / CRITICAL CARE MEDICINE   Name: Summer Bradley MRN: [id number] DOB: 1946-02-04    ADMISSION DATE:  01/29/2014 CONSULTATION DATE:  01/29/2014  REFERRING MD : Neuro  CHIEF COMPLAINT: Left sided weakness  HISTORY OF PRESENT ILLNESS:   9 yowf  with COPD quit smoking 12 y pta , PAF admitted with acute CVA to Stroke Service after clot retrieval from R MCA  STUDIES/SIGNIFICANT EVENTS: 12/14 CTA head: Occlusive thrombus at the right M1-M2 junction. Subjectively good pial/pial collateral circulation. Perfusion findings suggest moderate acute infarct in the central right MCA territory with rim of penumbra, as above. Remote infarcts at the anterior and posterior margins of the right MCA territory. Thin posterior falx subdural hemorrhage  12/14 admitted to stroke  service after Neuro IR clot retrieval from R MCA. Intubated for procedure. PCCM consultation 12/14 CT head: Acute infarct right MCA territory as noted on CT perfusion study. There is interval development of high density in the right  temporoparietal cortex which may be due to contrast material or hemorrhage.  12/14 Echocardiogram: Lvef 50-55% 12/15 Ct head: Mild cytotoxic edema suspected in that same right MCA territory. Underlying chronic ischemic changes. Stable parafalcine subdural hematoma. No new intracranial hemorrhage or acute mass effect. No new intracranial abnormality 12/15 Extubated 12/16 Transferred out of ICU. PCCM signed off 12/16 MRI brain: Acute nonhemorrhagic infarct involving the superior right temporal lobe, right insular ribbon, right frontal operculum, and posterior right frontal lobe. More remote infarcts are again noted in the more anterior inferior right frontal lobe and the right parietal lobe. Extensive white matter changes bilaterally likely reflect the sequela of chronic microvascular ischemia, advanced for age. White matter changes extend into the brainstem. Remote tender infarcts of the cerebellum  bilaterally, worse on the right. 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 Early edema suspected in the thalami right greater than left  12/17 Repeat cerebral angiogram by Neuro IR with clot retrieval. Remained intubated after procedure 12/17 CT head: Large RIGHT MCA territory infarct extending to RIGHT basal ganglia. Underlying atrophy and small vessel chronic ischemic changes. Stable parafalcine subdural hematoma. No new intracranial abnormalities  12/18 failed SBT due to tachypnea and low Vt. Wheezing noted 12/18 MRI brain: New, acute infarcts in the posterior circulation related to treated basilar thrombosis. Infarcts present in the bilateral cerebellum, upper right brainstem, mesial right temporal lobe, and minimally along the right occipital cortex 12/22  extubation.    12/22 failed swallow eval. 12/25 ST recs DI diet/ honey thickened       Tubes/Lines: OETT 12/14>>12/15, 12/17 >>12/22 RUE PICC 12/18 >>  SUBJECTIVE:  Continues to have problems with retained secretions/ cough/ On venti mask Afraid of episodes of dyspnea afebrile   VITAL SIGNS: Temp:  [96.9 F (36.1 C)-98.6 F (37 C)] 97.8 F (36.6 C) (12/28 0700) Pulse Rate:  [64-109] 73 (12/28 0407) Resp:  [14-25] 21 (12/28 0407) BP: (112-146)/(63-93) 146/78 mmHg (12/28 0407) SpO2:  [94 %-100 %] 99 % (12/28 1004) FiO2 (%):  [28 %] 28 % (12/28 1004)   HEMODYNAMICS: VENTILATOR SETTINGS: Vent Mode:  [-]  FiO2 (%):  [28 %] 28 % INTAKE / OUTPUT:  Intake/Output Summary (Last 24 hours) at 02/12/14 1027 Last data filed at 02/12/14 0600  Gross per 24 hour  Intake   1270 ml  Output    475 ml  Net    795 ml    PHYSICAL EXAMINATION: General:  Chronically ill appearing - no increased wob  HEENT:  Mucosa somewhat dry Cardiovascular:  IRIR,   Lungs: Diminished breath sounds / rattling congested sounding cough Abdomen:  Obese, large midline scar Musculoskeletal:   intact Skin:  Warm , no lower ext edema  LABS:  Recent Labs Lab 02/08/14 0630 02/11/14 0500 02/12/14 0548  NA 138 139 140  K 3.3* 3.7 3.9  CL 97 103 104  CO2 33* 29 28  BUN 28* 20 19  CREATININE 0.76 0.76 0.67  GLUCOSE 105* 123* 167*    Recent Labs Lab 02/08/14 0630 02/11/14 0500 02/12/14 0548  HGB 9.9* 9.6* 9.5*  HCT 33.4* 32.9* 33.2*  WBC 14.0* 20.2* 17.9*  PLT 574* 658* 628*    Dg Chest Port 1 View  02/11/2014   CLINICAL DATA:  68 year old female with atelectasis, respiratory failure. Initial encounter.  EXAM: PORTABLE CHEST - 1 VIEW  COMPARISON:  02/10/2014 and earlier.  FINDINGS: Portable AP semi upright view at 1153 hrs. Stable cardiomegaly and mediastinal contours. Stable right PICC line. Patchy and streaky bibasilar opacity with mildly lower lung volumes, not significantly changed. No pneumothorax or pulmonary edema. No definite pleural effusion.  IMPRESSION: No significant change in patchy bibasilar opacity since 02/08/2014. No new cardiopulmonary abnormality.   Electronically Signed   By: Lars Pinks M.D.   On: 02/11/2014 13:12     ASSESSMENT / PLAN:  Suspect aspiration pneumonitis Airway plugging 2/2 Aspiration/ retained secretions/ poor cough mechanics Sputum + moraxella 12/20 VDRF (resolved)  COPD with bronchospasm / noct "wheeze" and "choking" likely upper airway Suspect mild pulm edema(better with diuresis) - Titrate O2 for sats 90-94% - Humidified O2 - Cont scheduled and PRN levalbuterol nebs - drop  solumedrol to 40 q24  - Nasotracheal suctioning PRN - may aggravate upper airway instability so need to use this conservatively   - IV Unasyn for aspiration12/24 > Not clear we have an endpoint here as 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 Shade Flood. [place] [facility] Pager [phone] If no response call [phone]

## 2014-02-13 ENCOUNTER — Encounter (HOSPITAL_COMMUNITY): Payer: Self-pay | Admitting: Anesthesiology

## 2014-02-13 ENCOUNTER — Inpatient Hospital Stay (HOSPITAL_COMMUNITY): Payer: Medicare HMO

## 2014-02-13 ENCOUNTER — Inpatient Hospital Stay (HOSPITAL_COMMUNITY): Payer: Medicare HMO | Admitting: Certified Registered"

## 2014-02-13 DIAGNOSIS — J9383 Other pneumothorax: Secondary | ICD-10-CM | POA: Insufficient documentation

## 2014-02-13 DIAGNOSIS — J690 Pneumonitis due to inhalation of food and vomit: Secondary | ICD-10-CM | POA: Insufficient documentation

## 2014-02-13 LAB — POCT I-STAT 3, ART BLOOD GAS (G3+)
Bicarbonate: 27.5 mEq/L — ABNORMAL HIGH (ref 20.0–24.0)
O2 Saturation: 94 %
Patient temperature: 98.2
TCO2: 29 mmol/L (ref 0–100)
pCO2 arterial: 56 mmHg — ABNORMAL HIGH (ref 35.0–45.0)
pH, Arterial: 7.297 — ABNORMAL LOW (ref 7.350–7.450)
pO2, Arterial: 81 mmHg (ref 80.0–100.0)

## 2014-02-13 LAB — BASIC METABOLIC PANEL
Anion gap: 10 (ref 5–15)
BUN: 25 mg/dL — ABNORMAL HIGH (ref 6–23)
CO2: 27 mmol/L (ref 19–32)
Calcium: 8.7 mg/dL (ref 8.4–10.5)
Chloride: 100 mEq/L (ref 96–112)
Creatinine, Ser: 0.73 mg/dL (ref 0.50–1.10)
GFR calc Af Amer: >90 mL/min (ref >90)
GFR calc non Af Amer: 85 mL/min — ABNORMAL LOW (ref >90)
Glucose, Bld: 125 mg/dL — ABNORMAL HIGH (ref 70–99)
Potassium: 3.5 mmol/L (ref 3.5–5.1)
Sodium: 137 mmol/L (ref 135–145)

## 2014-02-13 MED ORDER — METHYLPREDNISOLONE SODIUM SUCC 40 MG IJ SOLR
20.0000 mg | Freq: Every day | INTRAMUSCULAR | Status: DC
  Administered 2014-02-14 – 2014-02-18 (×5): 20 mg via INTRAVENOUS
  Filled 2014-02-13 (×6): qty 0.5

## 2014-02-13 MED ORDER — PROPOFOL 10 MG/ML IV BOLUS
INTRAVENOUS | Status: DC | PRN
  Administered 2014-02-13: 100 mg via INTRAVENOUS

## 2014-02-13 MED ORDER — FENTANYL CITRATE 0.05 MG/ML IJ SOLN
50.0000 ug | Freq: Once | INTRAMUSCULAR | Status: DC
  Filled 2014-02-13: qty 2

## 2014-02-13 MED ORDER — LIDOCAINE HCL (PF) 1 % IJ SOLN
INTRAMUSCULAR | Status: AC
Start: 2014-02-13 — End: 2014-02-13
  Filled 2014-02-13: qty 10

## 2014-02-13 MED ORDER — FENTANYL BOLUS VIA INFUSION
25.0000 ug | INTRAVENOUS | Status: DC | PRN
  Filled 2014-02-13: qty 25

## 2014-02-13 MED ORDER — MIDAZOLAM HCL 2 MG/2ML IJ SOLN
1.0000 mg | INTRAMUSCULAR | Status: DC | PRN
  Administered 2014-02-13 – 2014-02-20 (×6): 2 mg via INTRAVENOUS
  Administered 2014-02-21: 1 mg via INTRAVENOUS
  Administered 2014-02-24 – 2014-02-26 (×2): 2 mg via INTRAVENOUS
  Administered 2014-02-28 (×2): 1 mg via INTRAVENOUS
  Administered 2014-03-01 – 2014-03-05 (×12): 2 mg via INTRAVENOUS
  Filled 2014-02-13 (×26): qty 2

## 2014-02-13 MED ORDER — ETOMIDATE 2 MG/ML IV SOLN
INTRAVENOUS | Status: AC
  Filled 2014-02-13: qty 10

## 2014-02-13 MED ORDER — PANTOPRAZOLE SODIUM 40 MG PO PACK
40.0000 mg | PACK | Freq: Two times a day (BID) | ORAL | Status: DC
  Administered 2014-02-14 – 2014-02-22 (×17): 40 mg
  Filled 2014-02-13 (×22): qty 20

## 2014-02-13 MED ORDER — ACETYLCYSTEINE 10 % IN SOLN
4.0000 mL | Freq: Three times a day (TID) | RESPIRATORY_TRACT | Status: DC
  Filled 2014-02-13 (×3): qty 4

## 2014-02-13 MED ORDER — LIDOCAINE HCL (PF) 1 % IJ SOLN
20.0000 mL | Freq: Once | INTRAMUSCULAR | Status: AC
  Administered 2014-02-13: 20 mL via INTRADERMAL

## 2014-02-13 MED ORDER — FENTANYL CITRATE 0.05 MG/ML IJ SOLN
INTRAMUSCULAR | Status: AC
  Administered 2014-02-13: 100 ug
  Filled 2014-02-13: qty 2

## 2014-02-13 MED ORDER — CHLORHEXIDINE GLUCONATE 0.12 % MT SOLN
15.0000 mL | Freq: Two times a day (BID) | OROMUCOSAL | Status: DC
  Administered 2014-02-13 – 2014-03-02 (×35): 15 mL via OROMUCOSAL
  Filled 2014-02-13 (×40): qty 15

## 2014-02-13 MED ORDER — CETYLPYRIDINIUM CHLORIDE 0.05 % MT LIQD
7.0000 mL | Freq: Four times a day (QID) | OROMUCOSAL | Status: DC
  Administered 2014-02-13 – 2014-03-03 (×72): 7 mL via OROMUCOSAL

## 2014-02-13 MED ORDER — GUAIFENESIN 100 MG/5ML PO SYRP
200.0000 mg | ORAL_SOLUTION | ORAL | Status: DC | PRN
Start: 2014-02-13 — End: 2014-03-05
  Administered 2014-02-26: 200 mg via ORAL
  Filled 2014-02-13 (×3): qty 10

## 2014-02-13 MED ORDER — ETOMIDATE 2 MG/ML IV SOLN
5.0000 mg | Freq: Once | INTRAVENOUS | Status: AC
  Administered 2014-02-13: 5 mg via INTRAVENOUS

## 2014-02-13 MED ORDER — DOCUSATE SODIUM 50 MG/5ML PO LIQD: 100.0000 mg | Freq: Two times a day (BID) | ORAL | Status: DC | PRN

## 2014-02-13 MED ORDER — DOPAMINE-DEXTROSE 3.2-5 MG/ML-% IV SOLN: 0.0000 ug/kg/min | INTRAVENOUS | Status: DC

## 2014-02-13 MED ORDER — SODIUM CHLORIDE 0.9 % IV SOLN
25.0000 ug/h | INTRAVENOUS | Status: DC
  Administered 2014-02-13 – 2014-02-15 (×3): 100 ug/h via INTRAVENOUS
  Administered 2014-02-15: 75 ug/h via INTRAVENOUS
  Administered 2014-02-16 – 2014-02-17 (×2): 100 ug/h via INTRAVENOUS
  Filled 2014-02-13 (×4): qty 50

## 2014-02-13 MED ORDER — ACETYLCYSTEINE 20 % IN SOLN
2.0000 mL | Freq: Three times a day (TID) | RESPIRATORY_TRACT | Status: AC
  Administered 2014-02-13 – 2014-02-14 (×3): 2 mL via RESPIRATORY_TRACT
  Administered 2014-02-14: 4 mL via RESPIRATORY_TRACT
  Administered 2014-02-14 – 2014-02-15 (×2): 2 mL via RESPIRATORY_TRACT
  Filled 2014-02-13 (×7): qty 4

## 2014-02-13 MED ORDER — SUCCINYLCHOLINE CHLORIDE 20 MG/ML IJ SOLN
INTRAMUSCULAR | Status: DC | PRN
  Administered 2014-02-13: 100 mg via INTRAVENOUS

## 2014-02-13 NOTE — Progress Notes (Signed)
RT Note: RT bagged & lavaged, suctioned tenacious, thick, yellow secretions from ET tube, MD notified. Pt tolerated well, VS WNL, RT will continue to monitor.

## 2014-02-13 NOTE — Procedures (Signed)
Chest Tube Insertion Procedure Note  Indications:  Clinically significant Pneumothorax  Pre-operative Diagnosis: Pneumothorax  Post-operative Diagnosis: Pneumothorax  Procedure Details  Informed consent was obtained for the procedure, including sedation.  Risks of lung perforation, hemorrhage, arrhythmia, and adverse drug reaction were discussed.   After sterile skin prep, using standard technique, a 20 French tube was placed in the left lateral 5th  rib space.  Findings: Immediate air rush.  1/7 air leak resolved after about 3 minutes of suction  Estimated Blood Loss:  Minimal         Specimens:  None              Complications:  None; patient tolerated the procedure well.         Disposition: ICU - intubated and hemodynamically stable.         Condition: stable  Erick Colace ACNP-BC [place] Pager # [phone] OR # [phone] if no answer  I was present and supervised the entire procedure.  Rush Farmer, M.D. [facility] Pulmonary/Critical Care Medicine. Pager: [phone]. After hours pager: [phone].

## 2014-02-13 NOTE — Progress Notes (Signed)
Chaplain responded to code.  Pt's daughter in hallway crying.  RN's provided great support and explained procedures well.  Pt's daughter was well supported by staff when making decision about her mother's care.  Chaplain met with family in waiting area and further provided emotional and spiritual support as well as ministry of presence, empathetic listening and hospitality.  Chaplain will follow up as needed.    02/13/14 1200  Clinical Encounter Type  Visited With Family;Patient not available  Visit Type Code;Critical Care  Referral From Care management  Spiritual Encounters  Spiritual Needs Emotional;Grief support  Stress Factors  Family Stress Factors Exhausted;Family relationships;Health changes;Lack of caregivers;Lack of knowledge;Major life changes   Geralyn Flash 02/13/2014 1:39 PM

## 2014-02-13 NOTE — Progress Notes (Addendum)
Called for bilateral PTX.  Films reviewed with radiology.  Patient clearly has bilateral PTX.  No evidence of tension.  Spoke with family.  Evidently not the first time patient had a PTX but they are unsure which side.  After review of the entire case and discussion with family they wished patient be full code and decision was made to perform bilateral chest tubes.  This was explained to the family and consent acquired x2 from both daughters and son who were all present.  Will proceed with bilateral chest tubes.  Spontaneous brachycardia and hypotension also noted.  ?neuro cause.  Will leave orders for dopamine and follow CVP.  The patient is critically ill with multiple organ systems failure and requires high complexity decision making for assessment and support, frequent evaluation and titration of therapies, application of advanced monitoring technologies and extensive interpretation of multiple databases.   Critical Care Time devoted to patient care services described in this note is  45  Minutes. This time reflects time of care of this signee Dr Jennet Maduro. This critical care time does not reflect procedure time, or teaching time or supervisory time of PA/NP/Med student/Med Resident etc but could involve care discussion time.  Rush Farmer, M.D. [facility]/Critical Care Medicine. Pager: [phone]. After hours pager: [phone].

## 2014-02-13 NOTE — Code Documentation (Signed)
CODE BLUE NOTE  Patient Name: Summer Bradley   MRN: [id number]   Date of Birth/ Sex: 20-Nov-1945 , female      Admission Date: 01/29/2014  Attending Provider: Rosalin Hawking, MD  Primary Diagnosis: <principal problem not specified>    Indication: Pt was in her usual state of health until this AM, when she was noted to be bradycardic/unresponsive. Code blue was subsequently called. At the time of arrival on scene, ACLS protocol was underway.    Technical Description:  - CPR performance duration:  0  minute  - Was defibrillation or cardioversion used? No   - Was external pacer placed? No  - Was patient intubated pre/post CPR? No    Medications Administered: Y = Yes; Blank = No Amiodarone    Atropine    Calcium    Epinephrine    Lidocaine    Magnesium    Norepinephrine    Phenylephrine    Sodium bicarbonate    Vasopressin      Post CPR evaluation:  - Final Status - Was patient successfully resuscitated ? Yes - What is current rhythm? NSR - What is current hemodynamic status? stable   Miscellaneous Information:  - Labs sent, including: none  - Primary team notified?  Yes  - Family Notified? Yes  - Additional notes/ transfer status: none   Elberta Leatherwood, MD  02/13/2014, 11:44 AM

## 2014-02-13 NOTE — Progress Notes (Addendum)
PULMONARY / CRITICAL CARE MEDICINE   Name: Summer Bradley MRN: [id number] DOB: [birth year]    ADMISSION DATE:  01/29/2014 CONSULTATION DATE:  01/29/2014  REFERRING MD : Neuro  CHIEF COMPLAINT: Left sided weakness  HISTORY OF PRESENT ILLNESS:   68 yowf  with COPD quit smoking 12 y pta , PAF admitted with acute CVA to Stroke Service after clot retrieval from R MCA  STUDIES/SIGNIFICANT EVENTS: 12/14 CTA head: Occlusive thrombus at the right M1-M2 junction. Subjectively good pial/pial collateral circulation. Perfusion findings suggest moderate acute infarct in the central right MCA territory with rim of penumbra, as above. Remote infarcts at the anterior and posterior margins of the right MCA territory. Thin posterior falx subdural hemorrhage  12/14 admitted to stroke  service after Neuro IR clot retrieval from R MCA. Intubated for procedure. PCCM consultation 12/14 CT head: Acute infarct right MCA territory as noted on CT perfusion study. There is interval development of high density in the right  temporoparietal cortex which may be due to contrast material or hemorrhage.  12/14 Echocardiogram: Lvef 50-55% 12/15 Ct head: Mild cytotoxic edema suspected in that same right MCA territory. Underlying chronic ischemic changes. Stable parafalcine subdural hematoma. No new intracranial hemorrhage or acute mass effect. No new intracranial abnormality 12/15 Extubated 12/16 Transferred out of ICU. PCCM signed off 12/16 MRI brain: Acute nonhemorrhagic infarct involving the superior right temporal lobe, right insular ribbon, right frontal operculum, and posterior right frontal lobe. More remote infarcts are again noted in the more anterior inferior right frontal lobe and the right parietal lobe. Extensive white matter changes bilaterally likely reflect the sequela of chronic microvascular ischemia, advanced for age. White matter changes extend into the brainstem. Remote tender infarcts of the cerebellum  bilaterally, worse on the right. 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 Early edema suspected in the thalami right greater than left  12/17 Repeat cerebral angiogram by Neuro IR with clot retrieval. Remained intubated after procedure 12/17 CT head: Large RIGHT MCA territory infarct extending to RIGHT basal ganglia. Underlying atrophy and small vessel chronic ischemic changes. Stable parafalcine subdural hematoma. No new intracranial abnormalities  12/18 failed SBT due to tachypnea and low Vt. Wheezing noted 12/18 MRI brain: New, acute infarcts in the posterior circulation related to treated basilar thrombosis. Infarcts present in the bilateral cerebellum, upper right brainstem, mesial right temporal lobe, and minimally along the right occipital cortex 12/22  extubation.    12/22 failed swallow eval. 12/25 ST recs DI diet/ honey thickened       Tubes/Lines: OETT 12/14>>12/15, 12/17 >>12/22 RUE PICC 12/18 >>  SUBJECTIVE: improved cough [place] afebrile   VITAL SIGNS: Temp:  [97.3 F (36.3 C)-98.2 F (36.8 C)] 98.2 F (36.8 C) (12/29 0727) Pulse Rate:  [64-98] 86 (12/29 0727) Resp:  [18-28] 18 (12/29 0727) BP: (106-154)/(68-92) 133/79 mmHg (12/29 0727) SpO2:  [93 %-100 %] 100 % (12/29 0925) FiO2 (%):  [28 %] 28 % (12/29 0003)   HEMODYNAMICS: VENTILATOR SETTINGS: Vent Mode:  [-]  FiO2 (%):  [28 %] 28 % INTAKE / OUTPUT:  Intake/Output Summary (Last 24 hours) at 02/13/14 1029 Last data filed at 02/13/14 0800  Gross per 24 hour  Intake    600 ml  Output    350 ml  Net    250 ml    PHYSICAL EXAMINATION: General:  Chronically ill appearing - no increased wob   HEENT:  Mucosa somewhat dry Cardiovascular:  Mariel Sleet,  Lungs: Diminished breath sounds  Abdomen:  Obese, large midline scar Musculoskeletal:  intact Skin:  Warm , no lower ext edema  LABS:  Recent Labs Lab 02/11/14 0500 02/12/14 0548 02/13/14 0326  NA 139  140 137  K 3.7 3.9 3.5  CL 103 104 100  CO2 29 28 27   BUN 20 19 25*  CREATININE 0.76 0.67 0.73  GLUCOSE 123* 167* 125*    Recent Labs Lab 02/08/14 0630 02/11/14 0500 02/12/14 0548  HGB 9.9* 9.6* 9.5*  HCT 33.4* 32.9* 33.2*  WBC 14.0* 20.2* 17.9*  PLT 574* 658* 628*    Dg Chest Port 1 View  02/11/2014   CLINICAL DATA:  68 year old female with atelectasis, respiratory failure. Initial encounter.  EXAM: PORTABLE CHEST - 1 VIEW  COMPARISON:  02/10/2014 and earlier.  FINDINGS: Portable AP semi upright view at 1153 hrs. Stable cardiomegaly and mediastinal contours. Stable right PICC line. Patchy and streaky bibasilar opacity with mildly lower lung volumes, not significantly changed. No pneumothorax or pulmonary edema. No definite pleural effusion.  IMPRESSION: No significant change in patchy bibasilar opacity since 02/08/2014. No new cardiopulmonary abnormality.   Electronically Signed   By: Lars Pinks M.D.   On: 02/11/2014 13:12     ASSESSMENT / PLAN:  Suspect aspiration pneumonitis Airway plugging 2/2 Aspiration/ retained secretions/ poor cough mechanics Sputum + moraxella 12/20 VDRF (resolved)  COPD with bronchospasm / noct "wheeze" and "choking" likely upper airway Suspect mild pulm edema(better with diuresis) - Titrate O2 for sats 90-94% - Humidified O2 - Cont scheduled and PRN levalbuterol nebs - drop  solumedrol to 20 q24 - can dc in 24h  - dc Nasotracheal suctioning   - IV Unasyn for aspiration12/24 > Not clear we have an endpoint here as recurrent chronic asp will be an ongoing problem> so d/c 12/27 and follow secretions/ temp curve/ cxr's  -chest vest bid -dc IVFs & lasix x1     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 Shade Flood. [place] [facility] Pager [phone] If no response call [phone]    Addendum - FOund to be bradycardic , unresponsive, code called, intubated by anesthesia, never lost pulse - vent /sedation orders given, await pCXR/ABG Discussed/ updated daughter - plan for trach eventually - will let family discuss & with pt first. Additional cc time x 30 mins  ALVA,RAKESH V. MD 12:14 PM

## 2014-02-13 NOTE — Transfer of Care (Signed)
Immediate Anesthesia Transfer of Care Note  Patient: Summer Bradley  Procedure(s) Performed: intubation  Patient Location: Nursing Unit  Anesthesia Type:General  Level of Consciousness: sedated  Airway & Oxygen Therapy: Patient remains intubated per anesthesia plan and Patient placed on Ventilator (see vital sign flow sheet for setting)  Post-op Assessment: Post -op Vital signs reviewed and stable  Post vital signs: Reviewed and stable  Complications: No apparent anesthesia complications

## 2014-02-13 NOTE — Progress Notes (Signed)
Rehab admissions - I attempted to go see patient this am to discuss inpatient rehab.  However, she was in respiratory distress at that time.  I will follow progress for now.  Call me for questions.  #[phone]

## 2014-02-13 NOTE — Progress Notes (Signed)
**Note De-Identified  Obfuscation** ETT advanced to 3cm currently 23@ LIP

## 2014-02-13 NOTE — Significant Event (Signed)
Rapid Response Event Note  Overview:  Called to assist with patient with decreasing O2 saturations and resp distress Time Called: 1050 Arrival Time: 1055 Event Type: Respiratory  Initial Focused Assessment:  On arrival patient supine in bed - HOB elevated - alert - warm and dry - follows commands - able to speak - anxious with resp distress - patient has been attempting to cough up mucus but has compromised weak cough secondary to new stroke events with this admission.  Able to weakly cough - mucus suctioned from back of throat.  Staff report she became increasingly anxious with secretions since last PM.  This AM ST reports wet sounding resps with eating.  Staff reports she has been OOB since 0400.  Just got back to bed.  NRB mask applied - patient unable to tolerate it on face - held to face as blow by - bil BS present - sounds rhonchus but hard to assess with upper airway seeming to spasm - patient anxious - moving air but choking easily on oral secretions and mucus. - Afib on monitor rate 134.  RR 28-32 = O2 sats up to 94 - 96% with NRB mask.  Oral mucosa very dry.  PICC line in place.  BP 158/98.  Staff reports this is the 3rd incident of this distress since last pm.  Patient has component of COPD also.    Interventions: Staff attempting flutter valve which they state has helped her during these episodes over last 24 hours.  Patient settles - now lung sounds clear - resting on 2 liter nasal cannula with O2 sats 96%. Oral suction back of throat reveals little tan secretions. Call to PCCM.  Call to Dr. Erlinda Hong  - neuro attending who is  reconsulting PCCM who signed off this AM.   Patient had second event - anxious - suction oral cavity - settles quickly this time - placed on simple mask with moisture flow - per RT has been on this earlier today. Patient tolerates - alert - relaxed and calm.  10 mins later staff calls to desk =  After another attempt to cough secretions patient now clamped down - blue agonal RR  - brady - called Code Blue - mouth clamped down - opened airway - oral airway placed - able to bag patient - color returns - HR 134 - no loss of pulse.  Stat page to PCCM - code team arrives.  Continue to bag without problem - question from previous PCCM/neuro notes if patient wishes another intubation.  Patient now with shallow breathing on own - waking - opens eyes to name - answers simple questions.   Anesthesia here - maintaining airway - continuing to bag per Ambu bag.  Georgann Housekeeper NP with PCCM present - speaking with family - decision to intubate. Intubated per anesthesia without incident.  See Code Blue Sheet for details of event.  Transferred to 91M post intubation - Fentanyl total 100 mcg IV given per order Dr. Elsworth Soho during transport as patient waking and pulling at tubes.  Handoff to [employer] on [address].  Questions answered.      Event Summary: Name of Physician Notified: Dr. Erlinda Hong at  (pta RRT)  Name of Consulting Physician Notified: Dr. Elsworth Soho at 1100  Outcome: Transferred (Comment)  Event End Time: [place]  [place]

## 2014-02-13 NOTE — Progress Notes (Signed)
RN called to room by pt daughter. Pt having episode where she is unable to breathe, rapid response called. Pt on 2LPM, increased to venti mask, then shortly after a non-rebreather. RT paged. Rapid arrived. CCM paged by [facility]. Attending Dr. Erlinda Hong paged--stated to reconsult CCM. Pt in high fowlers, this RN NTS pt twice, minimal thin mucus retrieved. Everlena Cooper RN performed oral suction, retrieved minimal tan secretions-appears to be applesauce. Pt had no gag reflex. RNs able to get patient calmed down, RR returned to normal, SpO2 returned to >93%. Daughter at bedside, anxious.   RN outside patient room when called back in by daughter. This RN and Meribeth Mattes, charge RN back at bedside, pt unable to move air. SpO2 decreased to 78%, pt grey-lips blue, loss of consciousness, Afib rate 70s. HR 150s afib/flutter-never lost pulse, called in Rapid Response RN and AC from hall; pt clamped down, respiratory Code blue called. Appropriate staff entering, oral airway placed, this RN stayed at bedside to assist. Paged Dr. Erlinda Hong to room d/t code blue, Dr. Erlinda Hong to bedside. Eddie Dibbles, NP with CCM at bedside. See rapid response note.   Pt transported to 3M14 by rapid RN, this RN and RT bagging patient.

## 2014-02-13 NOTE — Progress Notes (Signed)
PT Cancellation Note  Patient Details Name: Summer Bradley MRN: [id number] DOB: [birth year]   Cancelled Treatment:    Reason Eval/Treat Not Completed: Medical issues which prohibited therapy.  Will f/u tomorrow as appropriate.     Manoah Deckard, Thornton Papas 02/13/2014, 11:42 AM

## 2014-02-13 NOTE — Progress Notes (Signed)
STROKE TEAM PROGRESS NOTE   HISTORY Summer Bradley is a 68 y.o. female who got up around 4:30 AM 01/29/2014 to go to the bathroom and then sat down in her chair. She is not certain if her left arm was working at that time or not. She then fell out of her chair around 5 AM and that time noticed that her left arm wasn't working. She did not realize that anything was wrong until she fell out of her chair.  She was in her normal state of health ongoing to bed the evening prior. She was last known well at 8p on 01/28/2014.  In the ER, a CT was obtained which shows evidence of a previous stroke, and a CT perfusion scan was obtained which shows a significant mismatch demonstrating an area of penumbra, though there is an smaller area of likely infarct core as well.  Patient was not administered TPA secondary to being  outside of the tPA window with question of subdural hematoma on CT. Due to mismatch, she was taken to neuro intervention where she had complete revascularization of RT MCA M1 occlusion using 2 passes with the Solitaire and 10 mg of IA Integrelin. TICI 3 flow reesablished. Afterwards, she was admitted to the neuro ICU for further evaluation and treatment.   SUBJECTIVE (INTERVAL HISTORY):  Daughter at bedside. Initially, patient was seen in the morning, she was sitting in chair, much improved respiratory status although is intermittent coughing and difficulty with sputum. Neurological stable. However around noontime, she developed respiratory distress again with shortness of breath and desaturation. Rapid response was called, he was put on mask, but he eventually intubated. She was then transferred to the ICU for further care. Discussed with daughter, she may need a tracheostomy this time. Daughter will discuss with family.  OBJECTIVE Temp:  [94.9 F (34.9 C)-98.2 F (36.8 C)] 94.9 F (34.9 C) (12/29 1604) Pulse Rate:  [51-157] 54 (12/29 1645) Cardiac Rhythm:  [-] Atrial fibrillation;Atrial  flutter (12/29 0740) Resp:  [10-44] 18 (12/29 1500) BP: (73-187)/(45-121) 96/55 mmHg (12/29 1500) SpO2:  [92 %-100 %] 97 % (12/29 1645) FiO2 (%):  [28 %-60 %] 50 % (12/29 1645)   Recent Labs Lab 02/07/14 2015 02/08/14 0402  GLUCAP 174* 112*    Recent Labs Lab 02/07/14 0425 02/08/14 0630 02/11/14 0500 02/12/14 0548 02/13/14 0326  NA 145 138 139 140 137  K 3.5 3.3* 3.7 3.9 3.5  CL 103 97 103 104 100  CO2 32 33* 29 28 27   GLUCOSE 119* 105* 123* 167* 125*  BUN 31* 28* 20 19 25*  CREATININE 0.74 0.76 0.76 0.67 0.73  CALCIUM 8.7 8.5 8.4 8.6 8.7  MG 2.1 2.1  --   --   --   PHOS 4.0 3.1  --   --   --    No results for input(s): AST, ALT, ALKPHOS, BILITOT, PROT, ALBUMIN in the last 168 hours.  Recent Labs Lab 02/07/14 0425 02/08/14 0630 02/11/14 0500 02/12/14 0548  WBC 12.3* 14.0* 20.2* 17.9*  NEUTROABS  --   --  16.4*  --   HGB 8.8* 9.9* 9.6* 9.5*  HCT 30.8* 33.4* 32.9* 33.2*  MCV 82.4 83.5 83.1 81.6  PLT 504* 574* 658* 628*   No results for input(s): CKTOTAL, CKMB, CKMBINDEX, TROPONINI in the last 168 hours. No results for input(s): LABPROT, INR in the last 72 hours. No results for input(s): COLORURINE, LABSPEC, [place], GLUCOSEU, HGBUR, BILIRUBINUR, KETONESUR, PROTEINUR, UROBILINOGEN, NITRITE, LEUKOCYTESUR in the last 72  hours.  Invalid input(s): APPERANCEUR     Component Value Date/Time   CHOL 114 01/30/2014 0337   TRIG 102 01/30/2014 0337   HDL 24* 01/30/2014 0337   CHOLHDL 4.8 01/30/2014 0337   VLDL 20 01/30/2014 0337   LDLCALC 70 01/30/2014 0337   Lab Results  Component Value Date   HGBA1C 6.4* 01/30/2014      Component Value Date/Time   LABOPIA NONE DETECTED 01/29/2014 0659   COCAINSCRNUR NONE DETECTED 01/29/2014 0659   LABBENZ NONE DETECTED 01/29/2014 0659   AMPHETMU NONE DETECTED 01/29/2014 0659   THCU NONE DETECTED 01/29/2014 0659   LABBARB NONE DETECTED 01/29/2014 0659    No results for input(s): ETH in the last 168 hours.   Ct Head Wo  Contrast  02/01/2014  Stat CT scan shows no acute changes in the brain, but there appears to be a thrombus in the basilar artery that is new.  01/30/2014    1. Resolved right MCA territory hyperdensity present at 1056 hrs yesterday, most likely was post Neurointervention parenchymal contrast staining. 2. Mild cytotoxic edema suspected in that same right MCA territory. Underlying chronic ischemic changes. 3. Stable parafalcine subdural hematoma. No new intracranial hemorrhage or acute mass effect. 4. No new intracranial abnormality.    01/29/2014    Acute infarct right MCA territory as noted on CT perfusion study. There is interval development of high density in the right temporoparietal cortex which may be due to contrast material or hemorrhage. Continued follow-up recommended.  Small interhemispheric subdural hematoma is unchanged.    01/29/2014   1. 4 mm thick posterior falx subdural hemorrhage. 2. Prominent density at the right M1/M2 junction. CTA is already planned. 3. Established infarcts in the anterior and posterior right MCA territory. No definitive acute infarct. 4. Left frontal scalp contusion.  No acute fracture.    Ct Cerebral Perfusion W/cm 01/29/2014    1. Occlusive thrombus at the right M1-M2 junction. Subjectively good pial/pial collateral circulation. 2. Perfusion findings suggest moderate acute infarct in the central right MCA territory with rim of penumbra, as above. 3. Remote infarcts at the anterior and posterior margins of the right MCA territory. 4. Thin posterior falx subdural hemorrhage.     Cerebral Angiogram  01/29/2014 S/P Bilateral common carotid arteriograms, followed by complete revascularization of RT MCA M1 occlusion usingX2 passes with the Solitaire FR 70mm x 74mm retrieval device And 10 mg of  superselective intracranial IA Integrelin. TICI 3 flow reesablished.   MRI brain  02/02/14 1. New, acute infarcts in the posterior circulation related to treated basilar  thrombosis. Infarcts present in the bilateral cerebellum, upper right brainstem, mesial right temporal lobe, and minimally along the right occipital cortex. Normal flow voids within the intracranial vessels. 2. Stable appearance of recent right MCA territory infarct. 3. Stable trace falcine subdural hemorrhage.  01/31/14 1. Acute nonhemorrhagic infarct involving the superior right temporal lobe, right insular ribbon, right frontal operculum, and posterior right frontal lobe. 2. More remote infarcts are again noted in the more anterior inferior right frontal lobe and the right parietal lobe. 3. Extensive white matter changes bilaterally likely reflect the sequela of chronic microvascular ischemia, advanced for age. 4. White matter changes extend into the brainstem. 5. Remote tender infarcts of the cerebellum bilaterally, worse on the right.  Dg Chest Port 1 View 02/08/2014    1. Right PICC line stable position. 2. Congestive heart failure with interstitial edema and small right pleural effusion. Mild improvement from prior exam. 02/10/2014 Improved  bibasilar atelectasis versus airspace disease. 02/13/2014 - No pneumothorax status post bilateral chest tube placement. Stable support apparatus  2D Echocardiogram  EF 50-55% with no source of embolus.   A1C 6.4 and LDL 70   Physical Exam General: The patient is awake, following commands. The patient is obese. The patient has ecchymosis and abrasions around the left eye.  Respiratory: Diminished breath sounds at the bilateral bases with rhonchi and wheezing Cardiovascular: irregular rate and rhythm, no murmurs noted Abdomen: Soft Skin: No significant peripheral edema is noted. Neurologic Exam Mental status: The patient is cooperative, follows commands. Orientated to person, place, time, situation. She is distressed by the mucous plugs and her respiratory condition. Cranial nerves: Pupils are equal, reactive to light. Difficulty with  upgaze. Blnk to threat in all quadrants, VFF. Mild left facial asymmetry.  Motor: mild weakness left arm, left arm drift. Bilateral Hip flexion mild weakness. Otherwise intact.  Sensory examination: The patient has decreased sensation on the left arm Coordination: No obvious ataxia seen on the extremities. Difficult to test. Intact FTN, difficult to test HTS.  Gait and station: Gait was not tested. Reflexes: Deep tendon reflexes are brisk throughout.   ASSESSMENT/PLAN Ms. Summer Bradley is a 68 y.o. female with history of hypertension, stroke, afib and COPD presenting with Left-sided weakness. She did not receive IV t-PA due to delay in arrival, ? SDH. But underwent mechanical thrombectomy of right MCA with complete vascularization of right MCA M1 occlusion with IA Integrilin. 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 Dr. Estanislado Pandy was able to extract the clot, and the patient has sustained a right pontine and midbrain stroke, small bilateral cerebellar strokes, and some extension of the infarct in the mesial temporal area on the right.   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 Infarcts embolic secondary to Afib with subtherapeutic INR on coumadin.  Resultant left arm weakness and left facial droop  2D Echo  EF 50-55% no source of embolus  HgbA1c 6.4  eliquis po for VTE prophylaxis  aspirin 81 mg orally every day and warfarin prior to admission, changed to eliquis for stroke prevention  Neurologically stable   PT/OT on board and recommend CIR. Admissions coordinator is following.   Acute Respiratory Failure / COPD exacerbation   Intubated for neuro intervention-extubated 02/06/14  Tolerated well 24 hours post  extubation.  Once to the floor 12/23, overnight developed more respiratory distress and concerning for aspiration  12/24 Transferred to step down unit for further monitoring  On dysphagia 1 diet with honey thick liquids. Off IVF  Continue nebulization and decrease steroids to Qday.  CCM on board, appreciate their help  Developed the respiratory distress again on 02/13/2014, intubated and transferred to neuro ICU  Atrial Fibrillation / A flutter with AV block  Home meds:  Warfarin  INR subtherapeutic on admission, INR 1.18   started Cardizem and s/p d/c amiodarone.   Rate controlled.  Now on eliquis for stroke prevention   Hypertension  Continue to monitor  BP stable 120-140  On bisoprolol  Hyperlipidemia  Home meds:  Mevacor  LDL 70, goal < 70  On pravastatin  Continue statin on d/c  Dysphagia  Due to concerns of aspiration, she was put on NPO except meds.  Now status changed to dysphagia 1 with honey thick  liquids  Developed a respiratory distress again on 02/13/2014, intubated and transferred to neuro ICU. Concerning for aspiration again.  Trush  Fluconazole for 14 days  Started on 02/10/2014  Other Stroke Risk Factors  Advanced age  Obesity, Body mass index is 32.98 kg/(m^2).   Hx stroke/TIA  Other Active Problems  Depression, on chronic SSRI  Hypokalemia - 3.5 today  Anemia  Leukocytosis - on solumedrol - afebrile  Hospital day # 15   This patient is critically ill due to respiratory failure, embolic stroke and at significant risk of neurological and the respiratory worsening, death form respiratory failure, return to stroke. This patient's care requires constant monitoring of vital signs, hemodynamics, respiratory and cardiac monitoring, review of multiple databases, neurological assessment, discussion with family, other specialists and medical decision making of high complexity. I spent 45 minutes of neurocritical care time in the  care of this patient.  Rosalin Hawking, MD PhD Stroke Neurology 02/13/2014 6:15 PM   To contact Stroke Continuity provider, please refer to [email or web address] After hours, contact General Neurology

## 2014-02-13 NOTE — Progress Notes (Signed)
Speech Language Pathology Treatment: Dysphagia;Cognitive-Linquistic  Patient Details Name: Summer Bradley MRN: [id number] DOB: 01/27/1946 Today's Date: 02/13/2014 Time: 9211-9417 SLP Time Calculation (min) (ACUTE ONLY): 33 min  Assessment / Plan / Recommendation Clinical Impression  Pt has made excellent progress toward dysphagia and cognition goals. Sitting upright in chair, daughters present and eating breakfast. Initially daughter feeding pt stating "she puts too much in." SLP educated pt/daughter re: benefits of self feeding for increased motor strength, coordination and endurance.  Pt required max tactile/verbal/visual stimuli to decrease bite/sip size/rate, clear labial residue and coordination of food on utensil.  Delayed swallow initiation suspected and wet vocal quality x 1 throughout session. She spontaneously moved tray into left field of vision to compensate for left neglect/inattention.  Max assist needed for emergent awareness of deficits and need to decrease impulsivity and working memory.  SLP feels she would be a great candidate for inpatient rehab with progress demonstrated since last infarct. Pt appears motivated.  This therapist left a message for Dorthula Matas for hopeful CIR reassessment.    HPI HPI: 32 yof developed neuro changes 01/29/14 with left side weakness that resulted in a fall. Transported via EMS to [facility] where she was taken to IR and rt mca clot removal and revascularization procedure was performed. 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 Functionally, these deficits manifested as left buccal residue, reduced oral manipulation and control, anterior spillage, and wet phonation and cough associated with ice chips/thin liquids.  Pt advanced to dysphagia 2, thin liquids on 12/16.  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  Reintubated  12/17-12/22.  Revascularization 12/17.  12/18 MRI reveals new multi focal infarcts in the posterior circulation, affecting the mesial right temporal lobe (hippocampus included), right occipital cortex (small volume), bilateral mid and upper cerebellum (individual infarcts measure 15 mm or less), and right upper pons and midbrain. FEES 12/23 with recs to begin dysphagia 1, honey-thick liquids.  Pt subsequently with respiratory decline; pt made NPO 12/23 pm.      Pertinent Vitals Pain Assessment: No/denies pain  SLP Plan  Continue with current plan of care    Recommendations Diet recommendations: Dysphagia 1 (puree);Honey-thick liquid Liquids provided via: Cup;No straw Medication Administration: Whole meds with puree Supervision: Patient able to self feed;Full supervision/cueing for compensatory strategies;Staff to assist with self feeding Compensations: Slow rate;Small sips/bites;Check for pocketing;Check for anterior loss Postural Changes and/or Swallow Maneuvers: Seated upright 90 degrees              General recommendations: Rehab consult Oral Care Recommendations: Oral care BID Follow up Recommendations: Inpatient Rehab Plan: Continue with current plan of care    GO     [name] 02/13/2014, 9:15 AM  Orbie Pyo Colvin Caroli.[employer] [phone]

## 2014-02-13 NOTE — Procedures (Signed)
Right Sided Chest Tube Placement  Patient sedated and positioned, area cleaned and covered, lidocaine injected, skin incision done followed by blunt dissection, chest cavity entered and size 20 chest tube placed and sutured.  Tube connected to pleuravac and attached to 20 cmH2O of suction.  CXR ordered and pending.  Rush Farmer, M.D. [facility] Pulmonary/Critical Care Medicine. Pager: [phone]. After hours pager: [phone].

## 2014-02-14 ENCOUNTER — Inpatient Hospital Stay (HOSPITAL_COMMUNITY): Payer: Medicare HMO

## 2014-02-14 LAB — BASIC METABOLIC PANEL
Anion gap: 7 (ref 5–15)
BUN: 28 mg/dL — ABNORMAL HIGH (ref 6–23)
CO2: 32 mmol/L (ref 19–32)
Calcium: 8.3 mg/dL — ABNORMAL LOW (ref 8.4–10.5)
Chloride: 101 mEq/L (ref 96–112)
Creatinine, Ser: 0.78 mg/dL (ref 0.50–1.10)
GFR calc Af Amer: >90 mL/min (ref >90)
GFR calc non Af Amer: 83 mL/min — ABNORMAL LOW (ref >90)
Glucose, Bld: 117 mg/dL — ABNORMAL HIGH (ref 70–99)
Potassium: 3.8 mmol/L (ref 3.5–5.1)
Sodium: 140 mmol/L (ref 135–145)

## 2014-02-14 LAB — PROTIME-INR
INR: 1.78 — ABNORMAL HIGH (ref 0.00–1.49)
Prothrombin Time: 20.9 seconds — ABNORMAL HIGH (ref 11.6–15.2)

## 2014-02-14 LAB — CBC
HCT: 31.7 % — ABNORMAL LOW (ref 36.0–46.0)
Hemoglobin: 9.3 g/dL — ABNORMAL LOW (ref 12.0–15.0)
MCH: 24.2 pg — ABNORMAL LOW (ref 26.0–34.0)
MCHC: 29.3 g/dL — ABNORMAL LOW (ref 30.0–36.0)
MCV: 82.6 fL (ref 78.0–100.0)
Platelets: 559 10*3/uL — ABNORMAL HIGH (ref 150–400)
RBC: 3.84 MIL/uL — ABNORMAL LOW (ref 3.87–5.11)
RDW: 19.1 % — ABNORMAL HIGH (ref 11.5–15.5)
WBC: 18.5 10*3/uL — ABNORMAL HIGH (ref 4.0–10.5)

## 2014-02-14 LAB — APTT: aPTT: 32 seconds (ref 24–37)

## 2014-02-14 MED ORDER — PROPOFOL 10 MG/ML IV EMUL
5.0000 ug/kg/min | Freq: Once | INTRAVENOUS | Status: AC
  Administered 2014-02-15: 20 ug/kg/min via INTRAVENOUS

## 2014-02-14 MED ORDER — VECURONIUM BROMIDE 10 MG IV SOLR
10.0000 mg | Freq: Once | INTRAVENOUS | Status: DC
  Filled 2014-02-14: qty 10

## 2014-02-14 MED ORDER — ETOMIDATE 2 MG/ML IV SOLN
40.0000 mg | Freq: Once | INTRAVENOUS | Status: DC
  Filled 2014-02-14: qty 20

## 2014-02-14 MED ORDER — MIDAZOLAM HCL 2 MG/2ML IJ SOLN: 4.0000 mg | Freq: Once | INTRAMUSCULAR | Status: AC

## 2014-02-14 MED ORDER — SODIUM CHLORIDE 0.9 % IV SOLN
3.0000 g | Freq: Four times a day (QID) | INTRAVENOUS | Status: DC
  Administered 2014-02-14 – 2014-02-19 (×20): 3 g via INTRAVENOUS
  Filled 2014-02-14 (×24): qty 3

## 2014-02-14 MED ORDER — FENTANYL CITRATE 0.05 MG/ML IJ SOLN: 200.0000 ug | Freq: Once | INTRAMUSCULAR | Status: DC

## 2014-02-14 MED FILL — Medication: Qty: 1 | Status: AC

## 2014-02-14 NOTE — Progress Notes (Signed)
Physical Therapy Treatment Patient Details Name: Summer Bradley MRN: [id number] DOB: 05/16/1945 Today's Date: 02/14/2014    History of Present Illness pt presents post R MCA clot retrieval with revascularization.  pt intubated 12/14-15, 12/17-22, and re-intubated 12/29.      PT Comments    Pt able to work on sitting EOB, though seems to have increased anxiety and often reaches out for PT.  Noted plan for trach tomorrow and possible peg placement.  Pt may benefit from Ltach at D/C instead of SNF given pt's respiratory status.  Will continue to follow along.    Follow Up Recommendations  LTACH     Equipment Recommendations  None recommended by PT    Recommendations for Other Services       Precautions / Restrictions Precautions Precautions: Fall Precaution Comments: pt on vent and note plan for Trach and possible peg tomorrow.   Restrictions Weight Bearing Restrictions: No    Mobility  Bed Mobility Overal bed mobility: Needs Assistance;+2 for physical assistance Bed Mobility: Supine to Sit;Sit to Supine     Supine to sit: Mod assist;+2 for physical assistance;HOB elevated Sit to supine: Mod assist;+2 for physical assistance   General bed mobility comments: pt needing increased A to come to EOB and pt seeming more anxious, grabbing at PT.  pt needs cues to calm down and focus on task.    Transfers                    Ambulation/Gait                 Stairs            Wheelchair Mobility    Modified Rankin (Stroke Patients Only) Modified Rankin (Stroke Patients Only) Pre-Morbid Rankin Score: Moderate disability Modified Rankin: Severe disability     Balance Overall balance assessment: Needs assistance Sitting-balance support: Bilateral upper extremity supported;Feet supported Sitting balance-Leahy Scale: Poor Sitting balance - Comments: pt having increased difficulty maintaining midline balance and often reaches out to grab PT for support.   Needs cues for use of UEs.   Postural control: Left lateral lean;Posterior lean                          Cognition Arousal/Alertness: Awake/alert Behavior During Therapy: Impulsive Overall Cognitive Status: Difficult to assess                      Exercises      General Comments        Pertinent Vitals/Pain Pain Assessment: No/denies pain    Home Living                      Prior Function            PT Goals (current goals can now be found in the care plan section) Acute Rehab PT Goals Patient Stated Goal: not stated PT Goal Formulation: With patient/family Time For Goal Achievement: 02/19/14 Potential to Achieve Goals: Good Progress towards PT goals: Progressing toward goals    Frequency  Min 4X/week    PT Plan Discharge plan needs to be updated    Co-evaluation             End of Session Equipment Utilized During Treatment:  (Vent) Activity Tolerance: Patient limited by fatigue Patient left: in bed;with call bell/phone within reach     Time: 0943-1000 PT Time Calculation (min) (ACUTE ONLY): 17  min  Charges:  $Therapeutic Activity: 8-22 mins                    G CodesCatarina Hartshorn, [place] 02/14/2014, 3:45 PM

## 2014-02-14 NOTE — Clinical Social Work Note (Signed)
Clinical Social Worker continuing to follow patient and family for support and discharge planning needs.  Patient has transitioned from SDU back to ICU and is now back on the ventilator.  Patient daughter understanding for need for SNF once medically stable - preference to [facility] and [place].  CSW remains available for support and to facilitate patient discharge needs once medically stable.  Barbette Or, [place]

## 2014-02-14 NOTE — Progress Notes (Addendum)
PULMONARY / CRITICAL CARE MEDICINE   Name: Reah Justo MRN: [id number] DOB: [birth year]    ADMISSION DATE:  01/29/2014 CONSULTATION DATE:  01/29/2014  REFERRING MD : Neuro  CHIEF COMPLAINT: Left sided weakness  HISTORY OF PRESENT ILLNESS:   36 yowf  with COPD quit smoking 12 y pta , PAF admitted with acute CVA to Stroke Service after clot retrieval from R MCA  STUDIES/SIGNIFICANT EVENTS: 12/14 CTA head: Occlusive thrombus at the right M1-M2 junction. Subjectively good pial/pial collateral circulation. Perfusion findings suggest moderate acute infarct in the central right MCA territory with rim of penumbra, as above. Remote infarcts at the anterior and posterior margins of the right MCA territory. Thin posterior falx subdural hemorrhage  12/14 admitted to stroke  service after Neuro IR clot retrieval from R MCA. Intubated for procedure. PCCM consultation 12/14 CT head: Acute infarct right MCA territory as noted on CT perfusion study. There is interval development of high density in the right  temporoparietal cortex which may be due to contrast material or hemorrhage.  12/14 Echocardiogram: Lvef 50-55% 12/15 Ct head: Mild cytotoxic edema suspected in that same right MCA territory. Underlying chronic ischemic changes. Stable parafalcine subdural hematoma. No new intracranial hemorrhage or acute mass effect. No new intracranial abnormality 12/15 Extubated 12/16 Transferred out of ICU. PCCM signed off 12/16 MRI brain: Acute nonhemorrhagic infarct involving the superior right temporal lobe, right insular ribbon, right frontal operculum, and posterior right frontal lobe. More remote infarcts are again noted in the more anterior inferior right frontal lobe and the right parietal lobe. Extensive white matter changes bilaterally likely reflect the sequela of chronic microvascular ischemia, advanced for age. White matter changes extend into the brainstem. Remote tender infarcts of the cerebellum  bilaterally, worse on the right. 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 Early edema suspected in the thalami right greater than left  12/17 Repeat cerebral angiogram by Neuro IR with clot retrieval. Remained intubated after procedure 12/17 CT head: Large RIGHT MCA territory infarct extending to RIGHT basal ganglia. Underlying atrophy and small vessel chronic ischemic changes. Stable parafalcine subdural hematoma. No new intracranial abnormalities  12/18 failed SBT due to tachypnea and low Vt. Wheezing noted 12/18 MRI brain: New, acute infarcts in the posterior circulation related to treated basilar thrombosis. Infarcts present in the bilateral cerebellum, upper right brainstem, mesial right temporal lobe, and minimally along the right occipital cortex 12/22  extubation.    12/22 failed swallow eval. 12/25 ST recs DI diet/ honey thickened  12/29 resp arrest >>ETT, BL pnthoraces - chest tubes      Tubes/Lines: OETT 12/14>>12/15, 12/17 >>12/22, 12/29 >> RUE PICC 12/18 >>  SUBJECTIVE: awake, follows comamnds On vent No air leak on BL chest tubes Afebrile    VITAL SIGNS: Temp:  [94.9 F (34.9 C)-98.6 F (37 C)] 98.6 F (37 C) (12/30 0808) Pulse Rate:  [36-157] 68 (12/30 0700) Resp:  [10-44] 12 (12/30 0700) BP: (73-187)/(45-121) 108/53 mmHg (12/30 0700) SpO2:  [92 %-100 %] 99 % (12/30 0730) FiO2 (%):  [40 %-60 %] 40 % (12/30 0800) Weight:  [83.1 kg (183 lb 3.2 oz)] 83.1 kg (183 lb 3.2 oz) (12/30 0600)   HEMODYNAMICS: VENTILATOR SETTINGS: Vent Mode:  [-] PRVC FiO2 (%):  [40 %-60 %] 40 % Set Rate:  [15 bmp-18 bmp] 18 bmp Vt Set:  [340 mL-400 mL] 340 mL PEEP:  [5 cmH20] 5 cmH20 Plateau Pressure:  [12 cmH20-22 cmH20] 22 cmH20 INTAKE /  OUTPUT:  Intake/Output Summary (Last 24 hours) at 02/14/14 6301 Last data filed at 02/14/14 0800  Gross per 24 hour  Intake     75 ml  Output   1529 ml  Net  -1454 ml    PHYSICAL  EXAMINATION: General:  Chronically ill appearing , awake, denies pain  HEENT:  Mucosa somewhat dry Cardiovascular:  IRIR,   Lungs: Diminished breath sounds , no airl leak BL Abdomen:  Obese, large midline scar Musculoskeletal:  intact Skin:  Warm , no lower ext edema  LABS:  Recent Labs Lab 02/12/14 0548 02/13/14 0326 02/14/14 0500  NA 140 137 140  K 3.9 3.5 3.8  CL 104 100 101  CO2 28 27 32  BUN 19 25* 28*  CREATININE 0.67 0.73 0.78  GLUCOSE 167* 125* 117*    Recent Labs Lab 02/11/14 0500 02/12/14 0548 02/14/14 0500  HGB 9.6* 9.5* 9.3*  HCT 32.9* 33.2* 31.7*  WBC 20.2* 17.9* 18.5*  PLT 658* 628* 559*    Portable Chest Xray  02/14/2014   CLINICAL DATA:  Acute respiratory failure  EXAM: PORTABLE CHEST - 1 VIEW  COMPARISON:  Portable chest x-ray of February 13, 2014  FINDINGS: The lungs are well-expanded. Patchy interstitial densities in the left mid and lower lung are slightly less conspicuous today. There is no residual pneumothorax. The chest tubes are unchanged in position. The cardiac silhouette remains enlarged. The pulmonary vascularity is not engorged. There is no pleural effusion. The endotracheal tube tip lies 4.1 cm above the crotch of the carina. The esophagogastric tube tip projects below the inferior margin of the image. The right-sided PICC line tip projects over the midportion of the SVC.  IMPRESSION: 1. Improving interstitial pneumonia or subsegmental atelectasis in the left lower lobe. 2. Stable enlargement of the cardiac silhouette without evidence of pulmonary edema. 3. The support tubes and lines are in reasonable position radiographically. There is no pneumothorax.   Electronically Signed   By: David  Martinique   On: 02/14/2014 08:05   Dg Chest Port 1 View  02/13/2014   CLINICAL DATA:  Pneumothorax. Status post bilateral chest tube placement.  EXAM: PORTABLE CHEST - 1 VIEW  COMPARISON:  Same day.  FINDINGS: Endotracheal tube is in grossly good position with  distal tip 3 cm above the carina. Nasogastric tube is seen entering the stomach. Stable cardiomegaly. No pneumothorax is seen status post bilateral chest tubes placement. Tip of left-sided chest tube is seen in the left midlung. Right-sided chest tube tip is in apex. Stable right-sided PICC line. Bibasilar subsegmental atelectasis is noted.  IMPRESSION: No pneumothorax status post bilateral chest tube placement. Stable support apparatus.   Electronically Signed   By: Sabino Dick M.D.   On: 02/13/2014 15:26   Portable Chest Xray  02/13/2014   CLINICAL DATA:  Hypoxia  EXAM: PORTABLE CHEST - 1 VIEW  COMPARISON:  February 11, 2014  FINDINGS: Endotracheal tube tip is 8.3 cm above the carina. Central catheter tip is in the superior cava. Nasogastric tube extends below the diaphragm. There is a focal pneumothorax along the lateral and basilar right hemithorax regions without appreciable tension component. A slightly smaller pneumothorax is also noted at the left base laterally without tension component.  There is persistent interstitial edema in the lower lobes. Heart is mildly enlarged. Pulmonary vascularity is within normal limits. No frank consolidation. No adenopathy.  IMPRESSION: Tube and catheter positions as described. There are pneumothoraces in each lung base region, slightly larger on the  right than on the left. On the right, the pneumothorax extends along the lateral mid hemithorax.  Evidence of a degree of congestive heart failure.  Critical Value/emergent results were called by telephone at the time of interpretation on 02/13/2014 at 1:28 pm to Dr. Kara Mead , who verbally acknowledged these results.   Electronically Signed   By: Lowella Grip M.D.   On: 02/13/2014 13:29   Dg Abd Portable 1v  02/13/2014   CLINICAL DATA:  Nasogastric tube placement.  EXAM: PORTABLE ABDOMEN - 1 VIEW  COMPARISON:  02/02/2014  FINDINGS: Nasogastric tube present with the tip in the body of the stomach. Visualized  bowel gas shows no evidence of obstruction or significant ileus. Bilateral lower lung pneumothoraces identified which were reported on the chest x-ray earlier today.  IMPRESSION: Nasogastric tube in stomach. Bilateral pneumothoraces visible at the lung bases.   Electronically Signed   By: Aletta Edouard M.D.   On: 02/13/2014 13:47     ASSESSMENT / PLAN: Acute hypoxic resp failure - Suspect aspiration pneumonitis with resp arrest 12/29 BL pneumothorces  Airway plugging 2/2 Aspiration/ retained secretions/ poor cough mechanics VDRF recurrent  COPD with bronchospasm / noct "wheeze" and "choking" likely upper airway  mild pulm edema(better with diuresis) - Titrate O2 for sats 90-94% - Humidified O2 - Cont scheduled and PRN levalbuterol nebs -  solumedrol to 20 q24 - can dc in 24h  -chest tubes to water seal,dc once on ATC  HCAP  Sputum + moraxella 12/20 -recurrent aspiration  - IV Unasyn for aspiration12/24 > >>12/27 , 12/29 >>    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 ICU associated discomfort - Stroke mgmt per Stroke team  -HELD apixaban for procedures  Dysphagia -   Start TFs, plan for PEG     Family - discussed with daughters & sons  at bedside 12/30 with pt involved, agreeable to trach/peg, guarded but optimistic prognosis given to return to previous functioning as noted on 12/28 Plan for LTAC   The patient is critically ill with multiple organ systems failure and requires high complexity decision making for assessment and support, frequent evaluation and titration of therapies, application of advanced monitoring technologies and extensive interpretation of multiple databases. Critical Care Time devoted to patient care services described in this note independent of APP time is 35 minutes.     Rigoberto Noel MD 9:27 AM

## 2014-02-14 NOTE — Progress Notes (Signed)
STROKE TEAM PROGRESS NOTE   HISTORY Summer Bradley is a 68 y.o. female who got up around 4:30 AM 01/29/2014 to go to the bathroom and then sat down in her chair. She is not certain if her left arm was working at that time or not. She then fell out of her chair around 5 AM and that time noticed that her left arm wasn't working. She did not realize that anything was wrong until she fell out of her chair.  She was in her normal state of health ongoing to bed the evening prior. She was last known well at 8p on 01/28/2014.  In the ER, a CT was obtained which shows evidence of a previous stroke, and a CT perfusion scan was obtained which shows a significant mismatch demonstrating an area of penumbra, though there is an smaller area of likely infarct core as well.  Patient was not administered TPA secondary to being  outside of the tPA window with question of subdural hematoma on CT. Due to mismatch, she was taken to neuro intervention where she had complete revascularization of RT MCA M1 occlusion using 2 passes with the Solitaire and 10 mg of IA Integrelin. TICI 3 flow reesablished. Afterwards, she was admitted to the neuro ICU for further evaluation and treatment.   SUBJECTIVE (INTERVAL HISTORY):  Daughters at bedside. Pt awake alert but still intubated on ventilation. Overnight no respiratory issues and she seems comfortable. Discussed with Dr. Elsworth Soho and will likely proceed with trach tomorrow. Pt has agreed with trach through body language. Dr. Elsworth Soho also going to talk to family regarding PEG.   OBJECTIVE Temp:  [94.9 F (34.9 C)-98.6 F (37 C)] 97.9 F (36.6 C) (12/30 1226) Pulse Rate:  [36-92] 63 (12/30 1100) Cardiac Rhythm:  [-] Atrial fibrillation (12/30 0800) Resp:  [10-44] 17 (12/30 1100) BP: (73-136)/(45-78) 122/67 mmHg (12/30 1100) SpO2:  [92 %-100 %] 98 % (12/30 1126) FiO2 (%):  [40 %-50 %] 40 % (12/30 1126) Weight:  [183 lb 3.2 oz (83.1 kg)] 183 lb 3.2 oz (83.1 kg) (12/30  0600)   Recent Labs Lab 02/07/14 2015 02/08/14 0402  GLUCAP 174* 112*    Recent Labs Lab 02/08/14 0630 02/11/14 0500 02/12/14 0548 02/13/14 0326 02/14/14 0500  NA 138 139 140 137 140  K 3.3* 3.7 3.9 3.5 3.8  CL 97 103 104 100 101  CO2 33* 29 28 27  32  GLUCOSE 105* 123* 167* 125* 117*  BUN 28* 20 19 25* 28*  CREATININE 0.76 0.76 0.67 0.73 0.78  CALCIUM 8.5 8.4 8.6 8.7 8.3*  MG 2.1  --   --   --   --   PHOS 3.1  --   --   --   --    No results for input(s): AST, ALT, ALKPHOS, BILITOT, PROT, ALBUMIN in the last 168 hours.  Recent Labs Lab 02/08/14 0630 02/11/14 0500 02/12/14 0548 02/14/14 0500  WBC 14.0* 20.2* 17.9* 18.5*  NEUTROABS  --  16.4*  --   --   HGB 9.9* 9.6* 9.5* 9.3*  HCT 33.4* 32.9* 33.2* 31.7*  MCV 83.5 83.1 81.6 82.6  PLT 574* 658* 628* 559*   No results for input(s): CKTOTAL, CKMB, CKMBINDEX, TROPONINI in the last 168 hours. No results for input(s): LABPROT, INR in the last 72 hours. No results for input(s): COLORURINE, LABSPEC, [place], GLUCOSEU, HGBUR, BILIRUBINUR, KETONESUR, PROTEINUR, UROBILINOGEN, NITRITE, LEUKOCYTESUR in the last 72 hours.  Invalid input(s): APPERANCEUR     Component Value Date/Time  CHOL 114 01/30/2014 0337   TRIG 102 01/30/2014 0337   HDL 24* 01/30/2014 0337   CHOLHDL 4.8 01/30/2014 0337   VLDL 20 01/30/2014 0337   LDLCALC 70 01/30/2014 0337   Lab Results  Component Value Date   HGBA1C 6.4* 01/30/2014      Component Value Date/Time   LABOPIA NONE DETECTED 01/29/2014 0659   COCAINSCRNUR NONE DETECTED 01/29/2014 0659   LABBENZ NONE DETECTED 01/29/2014 0659   AMPHETMU NONE DETECTED 01/29/2014 0659   THCU NONE DETECTED 01/29/2014 0659   LABBARB NONE DETECTED 01/29/2014 0659    No results for input(s): ETH in the last 168 hours.   Ct Head Wo Contrast  02/01/2014  Stat CT scan shows no acute changes in the brain, but there appears to be a thrombus in the basilar artery that is new.  01/30/2014    1. Resolved  right MCA territory hyperdensity present at 1056 hrs yesterday, most likely was post Neurointervention parenchymal contrast staining. 2. Mild cytotoxic edema suspected in that same right MCA territory. Underlying chronic ischemic changes. 3. Stable parafalcine subdural hematoma. No new intracranial hemorrhage or acute mass effect. 4. No new intracranial abnormality.    01/29/2014    Acute infarct right MCA territory as noted on CT perfusion study. There is interval development of high density in the right temporoparietal cortex which may be due to contrast material or hemorrhage. Continued follow-up recommended.  Small interhemispheric subdural hematoma is unchanged.    01/29/2014   1. 4 mm thick posterior falx subdural hemorrhage. 2. Prominent density at the right M1/M2 junction. CTA is already planned. 3. Established infarcts in the anterior and posterior right MCA territory. No definitive acute infarct. 4. Left frontal scalp contusion.  No acute fracture.    Ct Cerebral Perfusion W/cm 01/29/2014    1. Occlusive thrombus at the right M1-M2 junction. Subjectively good pial/pial collateral circulation. 2. Perfusion findings suggest moderate acute infarct in the central right MCA territory with rim of penumbra, as above. 3. Remote infarcts at the anterior and posterior margins of the right MCA territory. 4. Thin posterior falx subdural hemorrhage.     Cerebral Angiogram  01/29/2014 S/P Bilateral common carotid arteriograms, followed by complete revascularization of RT MCA M1 occlusion usingX2 passes with the Solitaire FR 56mm x 49mm retrieval device And 10 mg of  superselective intracranial IA Integrelin. TICI 3 flow reesablished.   MRI brain  02/02/14 1. New, acute infarcts in the posterior circulation related to treated basilar thrombosis. Infarcts present in the bilateral cerebellum, upper right brainstem, mesial right temporal lobe, and minimally along the right occipital cortex. Normal flow voids  within the intracranial vessels. 2. Stable appearance of recent right MCA territory infarct. 3. Stable trace falcine subdural hemorrhage.  01/31/14 1. Acute nonhemorrhagic infarct involving the superior right temporal lobe, right insular ribbon, right frontal operculum, and posterior right frontal lobe. 2. More remote infarcts are again noted in the more anterior inferior right frontal lobe and the right parietal lobe. 3. Extensive white matter changes bilaterally likely reflect the sequela of chronic microvascular ischemia, advanced for age. 4. White matter changes extend into the brainstem. 5. Remote tender infarcts of the cerebellum bilaterally, worse on the right.  Dg Chest Port 1 View 02/08/2014    1. Right PICC line stable position. 2. Congestive heart failure with interstitial edema and small right pleural effusion. Mild improvement from prior exam. 02/10/2014 Improved bibasilar atelectasis versus airspace disease. 02/13/2014 - No pneumothorax status post bilateral chest tube  placement. Stable support apparatus  2D Echocardiogram  EF 50-55% with no source of embolus.   A1C 6.4 and LDL 70   Physical Exam General: The patient is awake, following commands. The patient is obese. The patient has ecchymosis and abrasions around the left eye.  Respiratory: Diminished breath sounds at the bilateral bases with rhonchi and wheezing Cardiovascular: irregular rate and rhythm, no murmurs noted Abdomen: Soft Skin: No significant peripheral edema is noted. Neurologic Exam Mental status: The patient is cooperative, follows commands. Orientated to person, place, time, situation. She is distressed by the mucous plugs and her respiratory condition. Cranial nerves: Pupils are equal, reactive to light. Difficulty with upgaze. Blnk to threat in all quadrants, VFF. Mild left facial asymmetry.  Motor: mild weakness left arm, left arm drift. Bilateral Hip flexion mild weakness. Otherwise intact.   Sensory examination: The patient has decreased sensation on the left arm Coordination: No obvious ataxia seen on the extremities. Difficult to test. Intact FTN, difficult to test HTS.  Gait and station: Gait was not tested. Reflexes: Deep tendon reflexes are brisk throughout.   ASSESSMENT/PLAN Ms. Andi Layfield is a 68 y.o. female with history of hypertension, stroke, afib and COPD presenting with Left-sided weakness. She did not receive IV t-PA due to delay in arrival, ? SDH. But underwent mechanical thrombectomy of right MCA with complete vascularization of right MCA M1 occlusion with IA Integrilin. 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 Dr. Estanislado Pandy was able to extract the clot, and the patient has sustained a right pontine and midbrain stroke, small bilateral cerebellar strokes, and some extension of the infarct in the mesial temporal area on the right.   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 Infarcts embolic secondary to Afib with subtherapeutic INR on coumadin.  Resultant left arm weakness and left facial droop  2D Echo  EF 50-55% no source of embolus  HgbA1c 6.4  eliquis po for VTE prophylaxis  aspirin 81 mg orally every day and warfarin prior to admission, changed to eliquis for stroke prevention  Neurologically stable   PT/OT on board and recommend CIR.   Deposition - pending   Acute Respiratory Failure / COPD exacerbation   Intubated for neuro intervention-extubated 02/06/14  Tolerated well 24 hours post extubation.  Once to the floor 12/23, overnight developed more respiratory distress and concerning for aspiration  12/24 Transferred to step down unit for further monitoring  On dysphagia  1 diet with honey thick liquids. Off IVF  Continue nebulization and decrease steroids to Qday.  CCM on board, appreciate their help  Developed the respiratory distress again on 02/13/2014, intubated and transferred to neuro ICU  Plan for trach in am  Atrial Fibrillation / A flutter with AV block  Home meds:  Warfarin  INR subtherapeutic on admission, INR 1.18   started Cardizem and s/p d/c amiodarone.   Rate controlled.  Now on eliquis for stroke prevention   Hypertension  Continue to monitor  BP stable 120-140  On bisoprolol  Hyperlipidemia  Home meds:  Mevacor  LDL 70, goal < 70  On pravastatin  Continue statin on d/c  Dysphagia  Due to concerns of aspiration, she was put on NPO except meds.  Now status changed to dysphagia 1 with honey thick liquids  Developed a respiratory distress again  on 02/13/2014, intubated and transferred to neuro ICU. Concerning for aspiration again. May consider PEG placement too.  Trush  Fluconazole for 14 days  Started on 02/10/2014  Other Stroke Risk Factors  Advanced age  Obesity, Body mass index is 33.5 kg/(m^2).   Hx stroke/TIA  Other Active Problems  Depression, on chronic SSRI  Hypokalemia - 3.5 today  Anemia  Leukocytosis - on solumedrol - afebrile  Hospital day # 16   This patient is critically ill due to respiratory failure, embolic stroke and at significant risk of neurological and the respiratory worsening, death form respiratory failure, return to stroke. This patient's care requires constant monitoring of vital signs, hemodynamics, respiratory and cardiac monitoring, review of multiple databases, neurological assessment, discussion with family, other specialists and medical decision making of high complexity. I spent 35 minutes of neurocritical care time in the care of this patient.  Rosalin Hawking, MD PhD Stroke Neurology 02/14/2014 12:35 PM   To contact Stroke Continuity provider, please refer  to [email or web address] After hours, contact General Neurology

## 2014-02-14 NOTE — Progress Notes (Signed)
UR completed.    Received CM consult for LTACH placement.  Pt has [employer] Medicare so would require a hospital stay >21 days (today is day 16 for patient), a trach >18 days old (pt not yet w/ trach) and would need to have 3 failed wean attempts documented.  If she gets to trach collar at any time before she is qualified for [facility], she would be ineligible to go.  I have asked the [employer] rep to do a preliminary benefit check on this patient. Will continue to follow along.   Sandi Mariscal, RN BSN [place] CCM Trauma/Neuro ICU Case Manager [phone]

## 2014-02-14 NOTE — Progress Notes (Signed)
SLP Cancellation Note  Patient Details Name: Summer Bradley MRN: [id number] DOB: Feb 02, 1946   Cancelled treatment:        On vent.  Will follow along.    [name] 02/14/2014, 7:56 AM  Orbie Pyo Colvin Caroli.Ed [employer] [phone]

## 2014-02-14 NOTE — Progress Notes (Signed)
NUTRITION FOLLOW-UP  DOCUMENTATION CODES Per approved criteria  -Obesity Unspecified   INTERVENTION: If pt remains intubated >/= 48 hours, recommend initiating tube feeding:  Recommend Vital AF 1.2 via OGT at 20 ml/hr and increase by 10 ml every 4 hours to goal rate of 40 ml/hr with 30 ml Prostat BID per tube to provide 1352 kcal (94% of kcal needs), 102 grams of protein, and 778 ml of free water.   NUTRITION DIAGNOSIS: Inadequate oral intake related to inability to eat as evidenced by NPO status; ongoing.   Goal: Enteral nutrition to provide 60-70% of estimated calorie needs (22-25 kcals/kg ideal body weight) and 100% of estimated protein needs, based on ASPEN guidelines for hypocaloric, high protein feeding in critically ill obese individuals  Monitor:  Vent status, TF initiation/tolerance if unable to extubate, weight trends, labs  ASSESSMENT: Acute R MCA CVA, s/p angiography, R MCA clot retrieval 12/14 with new stroke 12/17 and back to IR intervention.  12/22: Pt extubated.  12/23: FEES done- SLP recommend dysphagia 1 with honey thick liquids. 12/29: Pt bradycardic/unresponsive- code blue called. Pt re-intubated. Pt with clinically significant pneumothorax. Chest tube placed.  Patient is currently intubated on ventilator support MV: 7.0 L/min Temp (24hrs), Avg:97.7 F (36.5 C), Min:94.9 F (34.9 C), Max:98.6 F (37 C)  Propofol: none  Labs and medications reviewed.   Height: Ht Readings from Last 1 Encounters:  02/08/14 5\' 2"  (1.575 m)    Weight: Wt Readings from Last 1 Encounters:  02/14/14 183 lb 3.2 oz (83.1 kg)  Admission weight: 192 lb (87.2 kg) 12/14  BMI:  Body mass index is 33.5 kg/(m^2). Class I obesity  Re-Estimated Nutritional Needs: Kcal: 1441 Protein: >/= 100 grams Fluid: > 1.5 L/day  Skin: incision, laceration  Diet Order: Diet NPO time specified   Intake/Output Summary (Last 24 hours) at 02/14/14 1031 Last data filed at 02/14/14 1000  Gross per 24 hour  Intake     85 ml  Output   1529 ml  Net  -1444 ml    Last BM: 12/29  Labs:   Recent Labs Lab 02/08/14 0630  02/12/14 0548 02/13/14 0326 02/14/14 0500  NA 138  < > 140 137 140  K 3.3*  < > 3.9 3.5 3.8  CL 97  < > 104 100 101  CO2 33*  < > 28 27 32  BUN 28*  < > 19 25* 28*  CREATININE 0.76  < > 0.67 0.73 0.78  CALCIUM 8.5  < > 8.6 8.7 8.3*  MG 2.1  --   --   --   --   PHOS 3.1  --   --   --   --   GLUCOSE 105*  < > 167* 125* 117*  < > = values in this interval not displayed.  CBG (last 3)  No results for input(s): GLUCAP in the last 72 hours.  Scheduled Meds: . acetylcysteine  2 mL Nebulization TID  . ampicillin-sulbactam (UNASYN) IV  3 g Intravenous Q6H  . antiseptic oral rinse  7 mL Mouth Rinse QID  . bisoprolol  2.5 mg Oral BID  . chlorhexidine  15 mL Mouth Rinse BID  . citalopram  20 mg Per Tube QHS  . diltiazem  60 mg Oral 4 times per day  . feeding supplement (PRO-STAT SUGAR FREE 64)  30 mL Per Tube BID  . fentaNYL  50 mcg Intravenous Once  . fluconazole  100 mg Oral QPC supper  . levalbuterol  0.63 mg Nebulization QID  . levothyroxine  150 mcg Per Tube QAC breakfast  . methylPREDNISolone (SOLU-MEDROL) injection  20 mg Intravenous Daily  . pantoprazole sodium  40 mg Per Tube BID  . pravastatin  20 mg Per Tube q1800  . sodium chloride  10-40 mL Intracatheter Q12H    Continuous Infusions: . fentaNYL infusion INTRAVENOUS 50 mcg/hr (02/14/14 1000)    Kallie Locks, MS, RD, LDN Pager # [phone] After hours/ weekend pager # [phone]

## 2014-02-14 NOTE — Consult Note (Signed)
Reason for Consult: CVA, need for enteral access Referring Physician: Kara Mead  Jayla Mackie is an 68 y.o. female.  HPI: Summer Bradley was admitted status post CVA. She has a history of paroxysmal atrial fibrillation. She is recovering from her CVA and has ventilator dependent respiratory failure. She is undergoing tracheostomy tomorrow. I was consulted by the critical care medicine service to consider placement of PEG tube. She is awake though on the ventilator and seems to nod ascension and follows commands. Consent had been obtained by the CCM service but I spoke with her as well.  Past Medical History  Diagnosis Date  . Hypertension   . Stroke   . A-fib   . COPD (chronic obstructive pulmonary disease)     Past Surgical History  Procedure Laterality Date  . Abdominal surgery    . Radiology with anesthesia N/A 01/29/2014    Procedure: RADIOLOGY WITH ANESTHESIA;  Surgeon: Luanne Bras, MD;  Location: [facility];  Service: Radiology;  Laterality: N/A;  . Radiology with anesthesia N/A 02/01/2014    Procedure: RADIOLOGY WITH ANESTHESIA;  Surgeon: Rob Hickman, MD;  Location: [facility];  Service: Radiology;  Laterality: N/A;    History reviewed. No pertinent family history.  Social History:  reports that she has quit smoking. She does not have any smokeless tobacco history on file. She reports that she does not drink alcohol or use illicit drugs.  Allergies:  Allergies  Allergen Reactions  . Lisinopril     Unknown reaction    Medications:  Scheduled: . acetylcysteine  2 mL Nebulization TID  . ampicillin-sulbactam (UNASYN) IV  3 g Intravenous Q6H  . antiseptic oral rinse  7 mL Mouth Rinse QID  . bisoprolol  2.5 mg Oral BID  . chlorhexidine  15 mL Mouth Rinse BID  . citalopram  20 mg Per Tube QHS  . diltiazem  60 mg Oral 4 times per day  . etomidate  40 mg Intravenous Once  . feeding supplement (PRO-STAT SUGAR FREE 64)  30 mL Per Tube BID  . fentaNYL  200 mcg Intravenous  Once  . fentaNYL  50 mcg Intravenous Once  . fluconazole  100 mg Oral QPC supper  . levalbuterol  0.63 mg Nebulization QID  . levothyroxine  150 mcg Per Tube QAC breakfast  . methylPREDNISolone (SOLU-MEDROL) injection  20 mg Intravenous Daily  . midazolam  4 mg Intravenous Once  . pantoprazole sodium  40 mg Per Tube BID  . pravastatin  20 mg Per Tube q1800  . propofol  5-80 mcg/kg/min Intravenous Once  . sodium chloride  10-40 mL Intracatheter Q12H  . vecuronium  10 mg Intravenous Once   Continuous: . fentaNYL infusion INTRAVENOUS 50 mcg/hr (02/14/14 1600)   PFX:[id number] **OR** acetaminophen, bisacodyl, docusate **OR** docusate, fentaNYL, guaifenesin, levalbuterol, metoprolol, midazolam, ondansetron (ZOFRAN) IV, polyethylene glycol, RESOURCE THICKENUP CLEAR, sodium chloride  Results for orders placed or performed during the hospital encounter of 01/29/14 (from the past 48 hour(s))  Basic metabolic panel     Status: Abnormal   Collection Time: 02/13/14  3:26 AM  Result Value Ref Range   Sodium 137 135 - 145 mmol/L    Comment: Please note change in reference range.   Potassium 3.5 3.5 - 5.1 mmol/L    Comment: Please note change in reference range.   Chloride 100 96 - 112 mEq/L   CO2 27 19 - 32 mmol/L   Glucose, Bld 125 (H) 70 - 99 mg/dL   BUN 25 (H)  6 - 23 mg/dL   Creatinine, Ser 0.73 0.50 - 1.10 mg/dL   Calcium 8.7 8.4 - 10.5 mg/dL   GFR calc non Af Amer 85 (L) >90 mL/min   GFR calc Af Amer >90 >90 mL/min    Comment: (NOTE) The eGFR has been calculated using the CKD EPI equation. This calculation has not been validated in all clinical situations. eGFR's persistently <90 mL/min signify possible Chronic Kidney Disease.    Anion gap 10 5 - 15  I-STAT 3, arterial blood gas (G3+)     Status: Abnormal   Collection Time: 02/13/14  1:36 PM  Result Value Ref Range   pH, Arterial 7.297 (L) 7.350 - 7.450   pCO2 arterial 56.0 (H) 35.0 - 45.0 mmHg   pO2, Arterial 81.0 80.0 -  100.0 mmHg   Bicarbonate 27.5 (H) 20.0 - 24.0 mEq/L   TCO2 29 0 - 100 mmol/L   O2 Saturation 94.0 %   Patient temperature 98.2 F    Collection site RADIAL, ALLEN'S TEST ACCEPTABLE    Drawn by Operator    Sample type ARTERIAL   CBC     Status: Abnormal   Collection Time: 02/14/14  5:00 AM  Result Value Ref Range   WBC 18.5 (H) 4.0 - 10.5 K/uL   RBC 3.84 (L) 3.87 - 5.11 MIL/uL   Hemoglobin 9.3 (L) 12.0 - 15.0 g/dL   HCT 31.7 (L) 36.0 - 46.0 %   MCV 82.6 78.0 - 100.0 fL   MCH 24.2 (L) 26.0 - 34.0 pg   MCHC 29.3 (L) 30.0 - 36.0 g/dL   RDW 19.1 (H) 11.5 - 15.5 %   Platelets 559 (H) 150 - 400 K/uL  Basic metabolic panel     Status: Abnormal   Collection Time: 02/14/14  5:00 AM  Result Value Ref Range   Sodium 140 135 - 145 mmol/L    Comment: Please note change in reference range.   Potassium 3.8 3.5 - 5.1 mmol/L    Comment: Please note change in reference range.   Chloride 101 96 - 112 mEq/L   CO2 32 19 - 32 mmol/L   Glucose, Bld 117 (H) 70 - 99 mg/dL   BUN 28 (H) 6 - 23 mg/dL   Creatinine, Ser 0.78 0.50 - 1.10 mg/dL   Calcium 8.3 (L) 8.4 - 10.5 mg/dL   GFR calc non Af Amer 83 (L) >90 mL/min   GFR calc Af Amer >90 >90 mL/min    Comment: (NOTE) The eGFR has been calculated using the CKD EPI equation. This calculation has not been validated in all clinical situations. eGFR's persistently <90 mL/min signify possible Chronic Kidney Disease.    Anion gap 7 5 - 15  APTT     Status: None   Collection Time: 02/14/14  1:00 PM  Result Value Ref Range   aPTT 32 24 - 37 seconds  Protime-INR     Status: Abnormal   Collection Time: 02/14/14  1:00 PM  Result Value Ref Range   Prothrombin Time 20.9 (H) 11.6 - 15.2 seconds   INR 1.78 (H) 0.00 - 1.49    Portable Chest Xray  02/14/2014   CLINICAL DATA:  Acute respiratory failure  EXAM: PORTABLE CHEST - 1 VIEW  COMPARISON:  Portable chest x-ray of February 13, 2014  FINDINGS: The lungs are well-expanded. Patchy interstitial densities in  the left mid and lower lung are slightly less conspicuous today. There is no residual pneumothorax. The chest tubes are unchanged in  position. The cardiac silhouette remains enlarged. The pulmonary vascularity is not engorged. There is no pleural effusion. The endotracheal tube tip lies 4.1 cm above the crotch of the carina. The esophagogastric tube tip projects below the inferior margin of the image. The right-sided PICC line tip projects over the midportion of the SVC.  IMPRESSION: 1. Improving interstitial pneumonia or subsegmental atelectasis in the left lower lobe. 2. Stable enlargement of the cardiac silhouette without evidence of pulmonary edema. 3. The support tubes and lines are in reasonable position radiographically. There is no pneumothorax.   Electronically Signed   By: David  Martinique   On: 02/14/2014 08:05   Dg Chest Port 1 View  02/13/2014   CLINICAL DATA:  Pneumothorax. Status post bilateral chest tube placement.  EXAM: PORTABLE CHEST - 1 VIEW  COMPARISON:  Same day.  FINDINGS: Endotracheal tube is in grossly good position with distal tip 3 cm above the carina. Nasogastric tube is seen entering the stomach. Stable cardiomegaly. No pneumothorax is seen status post bilateral chest tubes placement. Tip of left-sided chest tube is seen in the left midlung. Right-sided chest tube tip is in apex. Stable right-sided PICC line. Bibasilar subsegmental atelectasis is noted.  IMPRESSION: No pneumothorax status post bilateral chest tube placement. Stable support apparatus.   Electronically Signed   By: Sabino Dick M.D.   On: 02/13/2014 15:26   Portable Chest Xray  02/13/2014   CLINICAL DATA:  Hypoxia  EXAM: PORTABLE CHEST - 1 VIEW  COMPARISON:  February 11, 2014  FINDINGS: Endotracheal tube tip is 8.3 cm above the carina. Central catheter tip is in the superior cava. Nasogastric tube extends below the diaphragm. There is a focal pneumothorax along the lateral and basilar right hemithorax regions without  appreciable tension component. A slightly smaller pneumothorax is also noted at the left base laterally without tension component.  There is persistent interstitial edema in the lower lobes. Heart is mildly enlarged. Pulmonary vascularity is within normal limits. No frank consolidation. No adenopathy.  IMPRESSION: Tube and catheter positions as described. There are pneumothoraces in each lung base region, slightly larger on the right than on the left. On the right, the pneumothorax extends along the lateral mid hemithorax.  Evidence of a degree of congestive heart failure.  Critical Value/emergent results were called by telephone at the time of interpretation on 02/13/2014 at 1:28 pm to Dr. Kara Mead , who verbally acknowledged these results.   Electronically Signed   By: Lowella Grip M.D.   On: 02/13/2014 13:29   Dg Abd Portable 1v  02/13/2014   CLINICAL DATA:  Nasogastric tube placement.  EXAM: PORTABLE ABDOMEN - 1 VIEW  COMPARISON:  02/02/2014  FINDINGS: Nasogastric tube present with the tip in the body of the stomach. Visualized bowel gas shows no evidence of obstruction or significant ileus. Bilateral lower lung pneumothoraces identified which were reported on the chest x-ray earlier today.  IMPRESSION: Nasogastric tube in stomach. Bilateral pneumothoraces visible at the lung bases.   Electronically Signed   By: Aletta Edouard M.D.   On: 02/13/2014 13:47    ROS unable to do as she is intubated Blood pressure 136/78, pulse 81, temperature 98 F (36.7 C), temperature source Oral, resp. rate 19, height 5' 2"  (1.575 m), weight 183 lb 3.2 oz (83.1 kg), SpO2 97 %. Physical Exam Await on ventilator Lungs mild wheeze bilaterally Cardiac irregular Abdomen soft, midline scar, supraumbilical incisional hernia partially reduces, no apparent tenderness Neuro follows some commands with right side  Assessment/Plan: Need for enteral access status post CVA. Patient has a prior midline incision with  a incisional hernia above the umbilicus. This does appear sufficiently away from her stomach so I feel attempt at PEG placement is warranted. She is currently on Unasyn and this will cover her surgical prophylaxis. She is scheduled for 10 AM tomorrow. Procedure, risks, and benefits were previously discussed. See above.  Knut Rondinelli E 02/14/2014, 4:02 PM

## 2014-02-14 NOTE — Progress Notes (Signed)
Called by bedside nurse, large air leak from right chest tube.  Chest tube evaluated, already out of the patient's skin.  Suture removed and wound dressed.  Two sutures left to approximate skin.  CXR ordered.  No evidence of PTX on new CXR.  No need for an additional chest tube.  Monitor closely for evidence of PTX overnight.  Family notified of incident and no need for interventions.  Rush Farmer, M.D. [facility] Pulmonary/Critical Care Medicine. Pager: [phone]. After hours pager: [phone].

## 2014-02-14 NOTE — Progress Notes (Signed)
ANTIBIOTIC CONSULT NOTE - INITIAL  Pharmacy Consult for Unasyn Indication: aspiration pneumonia  Allergies  Allergen Reactions  . Lisinopril     Unknown reaction    Patient Measurements: Height: 5\' 2"  (157.5 cm) Weight: 183 lb 3.2 oz (83.1 kg) IBW/kg (Calculated) : 50.1 Adjusted Body Weight: 63.3 kg  Vital Signs: Temp: 98.6 F (37 C) (12/30 0808) Temp Source: Oral (12/30 0808) BP: 108/53 mmHg (12/30 0700) Pulse Rate: 68 (12/30 0700) Intake/Output from previous day: 12/29 0701 - 12/30 0700 In: 65 [I.V.:65] Out: 3536 [Urine:1470; Emesis/NG output:50; Chest Tube:84] Intake/Output from this shift: Total I/O In: 10 [I.V.:10] Out: -   Labs:  Recent Labs  02/12/14 0548 02/13/14 0326 02/14/14 0500  WBC 17.9*  --  18.5*  HGB 9.5*  --  9.3*  PLT 628*  --  559*  CREATININE 0.67 0.73 0.78   Estimated Creatinine Clearance: 66.3 mL/min (by C-G formula based on Cr of 0.78). No results for input(s): VANCOTROUGH, VANCOPEAK, VANCORANDOM, GENTTROUGH, GENTPEAK, GENTRANDOM, TOBRATROUGH, TOBRAPEAK, TOBRARND, AMIKACINPEAK, AMIKACINTROU, AMIKACIN in the last 72 hours.   Microbiology: Recent Results (from the past 720 hour(s))  MRSA PCR Screening     Status: None   Collection Time: 01/29/14 11:28 AM  Result Value Ref Range Status   MRSA by PCR NEGATIVE NEGATIVE Final    Comment:        The GeneXpert MRSA Assay (FDA approved for NASAL specimens only), is one component of a comprehensive MRSA colonization surveillance program. It is not intended to diagnose MRSA infection nor to guide or monitor treatment for MRSA infections.   Culture, respiratory (NON-Expectorated)     Status: None   Collection Time: 02/04/14 11:30 AM  Result Value Ref Range Status   Specimen Description TRACHEAL ASPIRATE  Final   Special Requests Normal  Final   Gram Stain   Final    ABUNDANT WBC PRESENT, PREDOMINANTLY PMN NO SQUAMOUS EPITHELIAL CELLS SEEN ABUNDANT GRAM NEGATIVE COCCI Performed at  [facility]    Culture   Final    ABUNDANT MORAXELLA CATARRHALIS(BRANHAMELLA) Note: BETA LACTAMASE POSITIVE Performed at [facility]    Report Status 02/06/2014 FINAL  Final  Clostridium Difficile by PCR     Status: None   Collection Time: 02/05/14  3:50 PM  Result Value Ref Range Status   C difficile by pcr NEGATIVE NEGATIVE Final    Medical History: Past Medical History  Diagnosis Date  . Hypertension   . Stroke   . A-fib   . COPD (chronic obstructive pulmonary disease)     Assessment: 68 yo F admitted 01/29/2014 with L sided weakness>>R MCA infarct. 12/24 pharmacy consulted to dose unasyn for HCAP/aspiration PNA. 12/27 pt to trial off abx d/t ongoing chronic aspirations and inability to cough up secretions.  12/30 pharmacy consulted to dose unasyn for recurrent aspiration.  Pt afebrile, WBC elevated at 18.5 (on steroids).  12/20 Resp Cx (tracheal aspirate) >> moraxella cat.  CTX 12/20 >> 12/23 Unasyn 12/24 >> 12/27 Unasyn 12/30 >>  Plan:  - Unasyn 3g IV Q6h - F/U clinical improvement, renal fxn  Drucie Opitz, PharmD Clinical Pharmacy Resident Pager: [phone] 02/14/2014 10:11 AM

## 2014-02-14 NOTE — Progress Notes (Signed)
Large leak in R chest tube.  Paged MD and assessed skin around chest tube, tube out of pt's body.  Vaseline gauze and pressure tape applied.  No new pneumo noticed on CXR via Dr. Nelda Marseille.  Vital signs stable, pt has no complaints.  Will continue to monitor pt.

## 2014-02-14 NOTE — Progress Notes (Signed)
Rehab admissions - Noted patient back on vent.  I spoke with my rehab MD.  I am not planning to admit to inpatient rehab.  We feel patient will need SNF placement at the time of discharge for her rehab.  I will sign off for acute inpatient rehab at this time.  #[phone]

## 2014-02-15 ENCOUNTER — Inpatient Hospital Stay (HOSPITAL_COMMUNITY): Payer: Medicare HMO

## 2014-02-15 ENCOUNTER — Encounter (HOSPITAL_COMMUNITY): Payer: Self-pay | Admitting: Internal Medicine

## 2014-02-15 ENCOUNTER — Encounter (HOSPITAL_COMMUNITY)
Admission: EM | Disposition: A | Discharge status: Skilled Nursing Facility | Payer: Self-pay | Source: Home / Self Care | Attending: Neurology

## 2014-02-15 DIAGNOSIS — J9600 Acute respiratory failure, unspecified whether with hypoxia or hypercapnia: Secondary | ICD-10-CM

## 2014-02-15 HISTORY — PX: TRACHEOSTOMY: SUR1362

## 2014-02-15 LAB — BASIC METABOLIC PANEL
Anion gap: 6 (ref 5–15)
BUN: 29 mg/dL — ABNORMAL HIGH (ref 6–23)
CO2: 33 mmol/L — ABNORMAL HIGH (ref 19–32)
Calcium: 8.7 mg/dL (ref 8.4–10.5)
Chloride: 104 mEq/L (ref 96–112)
Creatinine, Ser: 0.84 mg/dL (ref 0.50–1.10)
GFR calc Af Amer: 80 mL/min — ABNORMAL LOW (ref >90)
GFR calc non Af Amer: 69 mL/min — ABNORMAL LOW (ref >90)
Glucose, Bld: 106 mg/dL — ABNORMAL HIGH (ref 70–99)
Potassium: 4 mmol/L (ref 3.5–5.1)
Sodium: 143 mmol/L (ref 135–145)

## 2014-02-15 LAB — CBC
HCT: 31.5 % — ABNORMAL LOW (ref 36.0–46.0)
Hemoglobin: 9 g/dL — ABNORMAL LOW (ref 12.0–15.0)
MCH: 23.4 pg — ABNORMAL LOW (ref 26.0–34.0)
MCHC: 28.6 g/dL — ABNORMAL LOW (ref 30.0–36.0)
MCV: 82 fL (ref 78.0–100.0)
Platelets: 507 10*3/uL — ABNORMAL HIGH (ref 150–400)
RBC: 3.84 MIL/uL — ABNORMAL LOW (ref 3.87–5.11)
RDW: 19.4 % — ABNORMAL HIGH (ref 11.5–15.5)
WBC: 17.8 10*3/uL — ABNORMAL HIGH (ref 4.0–10.5)

## 2014-02-15 SURGERY — EGD (ESOPHAGOGASTRODUODENOSCOPY)
Anesthesia: Moderate Sedation

## 2014-02-15 MED ORDER — MIDAZOLAM HCL 2 MG/2ML IJ SOLN
INTRAMUSCULAR | Status: AC
  Filled 2014-02-15: qty 4

## 2014-02-15 MED ORDER — PROPOFOL 10 MG/ML IV EMUL
INTRAVENOUS | Status: AC
  Filled 2014-02-15: qty 100

## 2014-02-15 MED ORDER — ROCURONIUM BROMIDE 50 MG/5ML IV SOLN
50.0000 mg | Freq: Once | INTRAVENOUS | Status: AC
  Administered 2014-02-15: 50 mg via INTRAVENOUS

## 2014-02-15 MED ORDER — MIDAZOLAM HCL 2 MG/2ML IJ SOLN
2.0000 mg | Freq: Once | INTRAMUSCULAR | Status: AC
  Administered 2014-02-15: 2 mg via INTRAVENOUS

## 2014-02-15 MED ORDER — FENTANYL CITRATE 0.05 MG/ML IJ SOLN: 25.0000 ug | INTRAMUSCULAR | Status: AC | PRN

## 2014-02-15 MED ORDER — FENTANYL BOLUS VIA INFUSION: 100.0000 ug | Freq: Once | INTRAVENOUS | Status: DC

## 2014-02-15 MED ORDER — FENTANYL BOLUS VIA INFUSION
100.0000 ug | Freq: Once | INTRAVENOUS | Status: AC
  Administered 2014-02-15: 100 ug via INTRAVENOUS
  Filled 2014-02-15: qty 100

## 2014-02-15 MED ORDER — APIXABAN 5 MG PO TABS
5.0000 mg | ORAL_TABLET | Freq: Two times a day (BID) | ORAL | Status: DC
  Administered 2014-02-16 – 2014-02-26 (×20): 5 mg via ORAL
  Filled 2014-02-15 (×24): qty 1

## 2014-02-15 MED ORDER — MIDAZOLAM HCL 2 MG/2ML IJ SOLN
INTRAMUSCULAR | Status: AC
  Filled 2014-02-15: qty 2

## 2014-02-15 NOTE — Procedures (Signed)
Name:  Summer Bradley MRN:  [id number] DOB:  May 13, 1945  OPERATIVE NOTE  Procedure:  Percutaneous tracheostomy.  Indications:  Ventilator-dependent respiratory failure, dysphagia, stroke  Consent:  Procedure, alternatives, risks and benefits discussed with medical POA.  Questions answered.  Consent obtained.  Anesthesia:  Prior residual roc, prop, verse, fent additional  Procedure summary:  Appropriate equipment was assembled.  The patient was identified as [name] and safety timeout was performed. The patient was placed in supine position with a towel roll behind shoulder blades and neck extended.  Sterile technique was used. The patient's neck and upper chest were prepped using chlorhexidine / alcohol scrub and the field was draped in usual sterile fashion with full body drape. After the adequate sedation / anesthesia was achieved, attention was directed at the midline trachea, where the cricothyroid membrane was palpated. Approximately two fingerbreadths above the sternal notch, a verticle incision was created with a scalpel after local infiltration with 0.2% Lidocaine. Then, using Seldinger technique and a percutaneous tracheostomy set, the trachea was entered with a 14 gauge needle with an overlying sheath. This was all confirmed under direct visualization of a fiberoptic flexible bronchoscope. Entrance into the trachea was identified through the third tracheal ring interspace. Following this, a guidewire was inserted. The needle was removed, leaving the sheath and the guidewire intact. Next, the sheath was removed and a small dilator was inserted. The tracheal rings were then dilated. A 6 Shiley was then opened. The balloon was checked. It was placed over a tracheal dilator, which was then advanced over the guidewire and through the previously dilated tract. The Shiley tracheostomy tube was noted to pass in the trachea with little resistance. The guidewire and dilator tubes were removed from the  trachea. An inner cannula was placed through the tracheostomy tube. The tracheostomy was then secured at the anterior neck with 4 monofilament sutures. The oral endotracheal tube was removed and the ventilator was attached to the newly placed tracheostomy tube. Adequate tidal volumes were noted. The cuff was inflated and no evidence of air leak was noted. No evidence of bleeding was noted. At this point, the procedure was concluded. Post-procedure chest x-ray was ordered.  Complications:  No immediate complications were noted.  Hemodynamic parameters and oxygenation remained stable throughout the procedure.  Estimated blood loss:  Less then 15 mL.  INR was 1.7, prior peg with limited bleeding, avoided ffp  Raylene Miyamoto., MD Pulmonary and [facility] Pager: [phone]  02/15/2014, 11:48 AM

## 2014-02-15 NOTE — Op Note (Signed)
02/15/2014  10:40 AM  PATIENT:  Summer Bradley  68 y.o. female  PRE-OPERATIVE DIAGNOSIS:  dysphagia/stroke  POST-OPERATIVE DIAGNOSIS:  Dysphagia/stroke  PROCEDURE:  Procedure(s): ESOPHAGOGASTRODUODENOSCOPY (EGD) PERCUTANEOUS ENDOSCOPIC GASTROSTOMY (PEG) PLACEMENT  SURGEON:  Surgeon(s): Georganna Skeans, MD  ASSISTANTS: Meghan Dort, PAC  ANESTHESIA:   local, IV sedation and Muscle relaxation  EBL:  Total I/O In: 7.5 [I.V.:7.5] Out: -   BLOOD ADMINISTERED:none  DRAINS: none   SPECIMEN:  No Specimen  DISPOSITION OF SPECIMEN:  N/A  COUNTS:  YES  DICTATION: .Dragon Dictation patient remained monitored in the neurotrauma intensive care unit. Informed consent had been obtained. She is on antibiotic protocol. Timeout procedure was performed. She received intravenous sedation, pain medication, and muscle relaxation. Scope was inserted via her mouth down into her esophagus. There were no gross lesions. The stomach was entered. The scope was advanced into the first portion the duodenum. There were no ulcers or obstructions. The scope was withdrawn back into the stomach. It was further insufflated. Excellent Poke site was obtained. Abdomen was prepped and draped in sterile fashion. Local was injected at the poke site and a small incision was made. NG cath was then inserted under direct vision found by guidewire which was grasped with the endoscopic snare. Guidewire was brought out through the mouth and attached the PEG tube in standard fashion. The PEG tube was brought out through the abdominal wall. The scope was reinserted into the stomach and PEG site was verified. Picture was taken. Flange was applied to the PEG tube and it was tightened to appropriate level. Triple antibiotic ointment was placed at PEG site along with a dressing. Abdominal binder will be applied. Stomach was evacuated and the scope was withdrawn. This completed the procedure. There were no apparent complications. She remained  intubated but stable.  PATIENT DISPOSITION:  ICU - intubated and hemodynamically stable.   Delay start of Pharmacological VTE agent (>24hrs) due to surgical blood loss or risk of bleeding:  no  Georganna Skeans, MD, MPH, FACS Pager: [phone]  12/31/201510:40 AM

## 2014-02-15 NOTE — Progress Notes (Signed)
Physical Therapy Treatment Patient Details Name: Summer Bradley MRN: [id number] DOB: 07/26/1945 Today's Date: 02/15/2014    History of Present Illness pt presents post R MCA clot retrieval with revascularization.  pt intubated 12/14-15, 12/17-22, and re-intubated 12/29.      PT Comments    Pt continues to be anxious with mobility and reaching out to grab PT.  Pt needs cues for staying on task and calming down and continues to require 2 person A.  Will continue to follow.    Follow Up Recommendations  LTACH     Equipment Recommendations  None recommended by PT    Recommendations for Other Services       Precautions / Restrictions Precautions Precautions: Fall Precaution Comments: pt on vent and plan for peg and trach today.   Restrictions Weight Bearing Restrictions: No    Mobility  Bed Mobility Overal bed mobility: Needs Assistance;+2 for physical assistance Bed Mobility: Supine to Sit;Sit to Supine     Supine to sit: Mod assist;+2 for physical assistance Sit to supine: Mod assist;+2 for physical assistance   General bed mobility comments: pt needing increased A to come to EOB and pt seeming more anxious, grabbing at PT.  pt needs cues to calm down and focus on task.    Transfers                    Ambulation/Gait                 Stairs            Wheelchair Mobility    Modified Rankin (Stroke Patients Only) Modified Rankin (Stroke Patients Only) Pre-Morbid Rankin Score: Moderate disability Modified Rankin: Severe disability     Balance Overall balance assessment: Needs assistance Sitting-balance support: Bilateral upper extremity supported;Feet supported Sitting balance-Leahy Scale: Poor Sitting balance - Comments: pt having increased difficulty maintaining midline balance and often reaches out to grab PT for support.  Needs cues for use of UEs.   Postural control: Left lateral lean                          Cognition  Arousal/Alertness: Awake/alert Behavior During Therapy: Impulsive Overall Cognitive Status: Difficult to assess                      Exercises      General Comments        Pertinent Vitals/Pain Pain Assessment: No/denies pain    Home Living                      Prior Function            PT Goals (current goals can now be found in the care plan section) Acute Rehab PT Goals Patient Stated Goal: not stated PT Goal Formulation: With patient/family Time For Goal Achievement: 02/19/14 Potential to Achieve Goals: Good Progress towards PT goals: Progressing toward goals    Frequency  Min 2X/week    PT Plan Current plan remains appropriate;Frequency needs to be updated    Co-evaluation             End of Session Equipment Utilized During Treatment:  (Vent) Activity Tolerance: Patient limited by fatigue Patient left: in bed;with call bell/phone within reach     Time: 0832-0851 PT Time Calculation (min) (ACUTE ONLY): 19 min  Charges:  $Therapeutic Activity: 8-22 mins  G CodesCatarina Hartshorn, Godwin 02/15/2014, 3:50 PM

## 2014-02-15 NOTE — Progress Notes (Signed)
PULMONARY / CRITICAL CARE MEDICINE   Name: Summer Bradley MRN: [id number] DOB: [birth year]    ADMISSION DATE:  01/29/2014 CONSULTATION DATE:  01/29/2014  REFERRING MD : Neuro  CHIEF COMPLAINT: Left sided weakness  HISTORY OF PRESENT ILLNESS:   61 yowf  with COPD quit smoking 12 y pta , PAF admitted with acute CVA to Stroke Service after clot retrieval from R MCA  STUDIES/SIGNIFICANT EVENTS: 12/14 CTA head: Occlusive thrombus at the right M1-M2 junction. Subjectively good pial/pial collateral circulation. Perfusion findings suggest moderate acute infarct in the central right MCA territory with rim of penumbra, as above. Remote infarcts at the anterior and posterior margins of the right MCA territory. Thin posterior falx subdural hemorrhage  12/14 admitted to stroke  service after Neuro IR clot retrieval from R MCA. Intubated for procedure. PCCM consultation 12/14 CT head: Acute infarct right MCA territory as noted on CT perfusion study. There is interval development of high density in the right  temporoparietal cortex which may be due to contrast material or hemorrhage.  12/14 Echocardiogram: Lvef 50-55% 12/15 Ct head: Mild cytotoxic edema suspected in that same right MCA territory. Underlying chronic ischemic changes. Stable parafalcine subdural hematoma. No new intracranial hemorrhage or acute mass effect. No new intracranial abnormality 12/15 Extubated 12/16 Transferred out of ICU. PCCM signed off 12/16 MRI brain: Acute nonhemorrhagic infarct involving the superior right temporal lobe, right insular ribbon, right frontal operculum, and posterior right frontal lobe. More remote infarcts are again noted in the more anterior inferior right frontal lobe and the right parietal lobe. Extensive white matter changes bilaterally likely reflect the sequela of chronic microvascular ischemia, advanced for age. White matter changes extend into the brainstem. Remote tender infarcts of the cerebellum  bilaterally, worse on the right. 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 Early edema suspected in the thalami right greater than left  12/17 Repeat cerebral angiogram by Neuro IR with clot retrieval. Remained intubated after procedure 12/17 CT head: Large RIGHT MCA territory infarct extending to RIGHT basal ganglia. Underlying atrophy and small vessel chronic ischemic changes. Stable parafalcine subdural hematoma. No new intracranial abnormalities  12/18 failed SBT due to tachypnea and low Vt. Wheezing noted 12/18 MRI brain: New, acute infarcts in the posterior circulation related to treated basilar thrombosis. Infarcts present in the bilateral cerebellum, upper right brainstem, mesial right temporal lobe, and minimally along the right occipital cortex 12/22  extubation.    12/22 failed swallow eval. 12/25 ST recs DI diet/ honey thickened  12/29 resp arrest >>ETT, BL pnthoraces - chest tubes 12/30 rt chest tube out      Tubes/Lines: OETT 12/14>>12/15, 12/17 >>12/22, 12/29 >>12/31 Trach 12/31 >> RUE PICC 12/18 >>  SUBJECTIVE: awake, follows comamnds Weans well on PS 8/5  Afebrile    VITAL SIGNS: Temp:  [97.5 F (36.4 C)-98 F (36.7 C)] 97.6 F (36.4 C) (12/31 1200) Pulse Rate:  [52-143] 60 (12/31 1300) Resp:  [11-40] 18 (12/31 1300) BP: (79-163)/(53-123) 128/68 mmHg (12/31 1300) SpO2:  [95 %-100 %] 98 % (12/31 1300) FiO2 (%):  [40 %] 40 % (12/31 1200)   HEMODYNAMICS: VENTILATOR SETTINGS: Vent Mode:  [-] PRVC FiO2 (%):  [40 %] 40 % Set Rate:  [18 bmp] 18 bmp Vt Set:  [340 mL] 340 mL PEEP:  [5 cmH20] 5 cmH20 Plateau Pressure:  [17 cmH20-24 cmH20] 17 cmH20 INTAKE / OUTPUT:  Intake/Output Summary (Last 24 hours) at 02/15/14 1309 Last data filed at 02/15/14  1300  Gross per 24 hour  Intake 572.84 ml  Output   1276 ml  Net -703.16 ml    PHYSICAL EXAMINATION: General:  Chronically ill appearing , awake, denies pain   HEENT:  Mucosa somewhat dry Cardiovascular:  IRIR,   Lungs: Diminished breath sounds , no airl leak Abdomen:  Obese, large midline scar Musculoskeletal:  intact Skin:  Warm , no lower ext edema  LABS:  Recent Labs Lab 02/13/14 0326 02/14/14 0500 02/15/14 0500  NA 137 140 143  K 3.5 3.8 4.0  CL 100 101 104  CO2 27 32 33*  BUN 25* 28* 29*  CREATININE 0.73 0.78 0.84  GLUCOSE 125* 117* 106*    Recent Labs Lab 02/12/14 0548 02/14/14 0500 02/15/14 0500  HGB 9.5* 9.3* 9.0*  HCT 33.2* 31.7* 31.5*  WBC 17.9* 18.5* 17.8*  PLT 628* 559* 507*    Dg Chest Port 1 View  02/15/2014   CLINICAL DATA:  Acute respiratory failure, new tracheostomy.  EXAM: PORTABLE CHEST - 1 VIEW  COMPARISON:  02/14/2014.  FINDINGS: Tracheostomy is midline. Right PICC tip projects over the SVC. Heart is enlarged, stable. There is left basilar airspace disease. Lucencies projecting along the left infrahilar region may be due to pleural air or overlying subcutaneous emphysema. Left chest tube is in place at the base of the left hemi thorax. Minimal volume loss at the right lung base. Suspect emphysema. Subcutaneous emphysema is seen along lower left lateral chest wall.  IMPRESSION: 1. Left chest tube in place. Lucencies projecting over the lower left hemi thorax may be due to subcutaneous emphysema. Difficult to definitively exclude a pneumothorax. 2. Left basilar airspace disease, as before.   Electronically Signed   By: Lorin Picket M.D.   On: 02/15/2014 12:17   Dg Chest Port 1 View  02/14/2014   CLINICAL DATA:  Status post right chest tube removal. Evaluate for pneumothorax.  EXAM: PORTABLE CHEST - 1 VIEW  COMPARISON:  Chest radiograph earlier the same day at 05:25 hr  FINDINGS: Portable AP chest radiograph at 17:42: Right-sided chest tube has been removed. No definite pneumothorax. Left-sided chest tube is unchanged in position, tip in the mid lung zone. There is no left pneumothorax. Endotracheal tube remains  in place, 2.4 cm from the carina. Enteric tube in place, tip below the diaphragm not included in the field of view. Right upper extremity PICC tip in the SVC. Stable enlargement of the cardiac silhouette. Unchanged interstitial opacities CT the left greater than right lung base.  IMPRESSION: Removal of right-sided chest tube without pneumothorax.   Electronically Signed   By: Jeb Levering M.D.   On: 02/14/2014 18:16   Portable Chest Xray  02/14/2014   CLINICAL DATA:  Acute respiratory failure  EXAM: PORTABLE CHEST - 1 VIEW  COMPARISON:  Portable chest x-ray of February 13, 2014  FINDINGS: The lungs are well-expanded. Patchy interstitial densities in the left mid and lower lung are slightly less conspicuous today. There is no residual pneumothorax. The chest tubes are unchanged in position. The cardiac silhouette remains enlarged. The pulmonary vascularity is not engorged. There is no pleural effusion. The endotracheal tube tip lies 4.1 cm above the crotch of the carina. The esophagogastric tube tip projects below the inferior margin of the image. The right-sided PICC line tip projects over the midportion of the SVC.  IMPRESSION: 1. Improving interstitial pneumonia or subsegmental atelectasis in the left lower lobe. 2. Stable enlargement of the cardiac silhouette without evidence of  pulmonary edema. 3. The support tubes and lines are in reasonable position radiographically. There is no pneumothorax.   Electronically Signed   By: David  Martinique   On: 02/14/2014 08:05   Dg Chest Port 1 View  02/13/2014   CLINICAL DATA:  Pneumothorax. Status post bilateral chest tube placement.  EXAM: PORTABLE CHEST - 1 VIEW  COMPARISON:  Same day.  FINDINGS: Endotracheal tube is in grossly good position with distal tip 3 cm above the carina. Nasogastric tube is seen entering the stomach. Stable cardiomegaly. No pneumothorax is seen status post bilateral chest tubes placement. Tip of left-sided chest tube is seen in the left  midlung. Right-sided chest tube tip is in apex. Stable right-sided PICC line. Bibasilar subsegmental atelectasis is noted.  IMPRESSION: No pneumothorax status post bilateral chest tube placement. Stable support apparatus.   Electronically Signed   By: Sabino Dick M.D.   On: 02/13/2014 15:26   Portable Chest Xray  02/13/2014   CLINICAL DATA:  Hypoxia  EXAM: PORTABLE CHEST - 1 VIEW  COMPARISON:  February 11, 2014  FINDINGS: Endotracheal tube tip is 8.3 cm above the carina. Central catheter tip is in the superior cava. Nasogastric tube extends below the diaphragm. There is a focal pneumothorax along the lateral and basilar right hemithorax regions without appreciable tension component. A slightly smaller pneumothorax is also noted at the left base laterally without tension component.  There is persistent interstitial edema in the lower lobes. Heart is mildly enlarged. Pulmonary vascularity is within normal limits. No frank consolidation. No adenopathy.  IMPRESSION: Tube and catheter positions as described. There are pneumothoraces in each lung base region, slightly larger on the right than on the left. On the right, the pneumothorax extends along the lateral mid hemithorax.  Evidence of a degree of congestive heart failure.  Critical Value/emergent results were called by telephone at the time of interpretation on 02/13/2014 at 1:28 pm to Dr. Kara Mead , who verbally acknowledged these results.   Electronically Signed   By: Lowella Grip M.D.   On: 02/13/2014 13:29   Dg Abd Portable 1v  02/13/2014   CLINICAL DATA:  Nasogastric tube placement.  EXAM: PORTABLE ABDOMEN - 1 VIEW  COMPARISON:  02/02/2014  FINDINGS: Nasogastric tube present with the tip in the body of the stomach. Visualized bowel gas shows no evidence of obstruction or significant ileus. Bilateral lower lung pneumothoraces identified which were reported on the chest x-ray earlier today.  IMPRESSION: Nasogastric tube in stomach. Bilateral  pneumothoraces visible at the lung bases.   Electronically Signed   By: Aletta Edouard M.D.   On: 02/13/2014 13:47     ASSESSMENT / PLAN: Acute hypoxic resp failure - Suspect aspiration pneumonitis with resp arrest 12/29 BL pneumothorces -resolved Airway plugging 2/2 Aspiration/ retained secretions/ poor cough mechanics VDRF recurrent  COPD with bronchospasm / noct "wheeze" and "choking" likely upper airway  mild pulm edema(better with diuresis) - Titrate O2 for sats 90-94% - Humidified O2 - Cont scheduled and PRN levalbuterol nebs -  solumedrol  dc  -chest tube to water seal,dc once on ATC -bedside trach today  HCAP  Sputum + moraxella 12/20 -recurrent aspiration  - IV Unasyn for aspiration12/24 > >>12/27 , 12/29 >>    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 ICU associated discomfort - Stroke mgmt per Stroke team  -HELD apixaban for procedures, can resume after  Dysphagia -   resume TFs 1/1, PEG today     Family - discussed with  daughters & sons  at bedside 12/30 with pt involved, agreeable to trach/peg, guarded but optimistic prognosis given to return to previous functioning as noted on 12/28 Plan for LTAC   The patient is critically ill with multiple organ systems failure and requires high complexity decision making for assessment and support, frequent evaluation and titration of therapies, application of advanced monitoring technologies and extensive interpretation of multiple databases. Critical Care Time devoted to patient care services described in this note independent of APP time is 35 minutes.     Rigoberto Noel MD 1:09 PM

## 2014-02-15 NOTE — Procedures (Signed)
Bronchoscopy Procedure Note Summer Bradley [id number] [birth year]  Procedure: Bronchoscopy Indications: Diagnostic evaluation of the airways, for perc trach   Procedure Details Consent: Risks of procedure as well as the alternatives and risks of each were explained to the (patient/caregiver).  Consent for procedure obtained. Time Out: Verified patient identification, verified procedure, site/side was marked, verified correct patient position, special equipment/implants available, medications/allergies/relevent history reviewed, required imaging and test results available.  Performed  In preparation for procedure, patient was given 100% FiO2 and bronchoscope lubricated. Sedation: Benzodiazepines, Muscle relaxants and Etomidate  Airway entered and the following bronchi were examined: Bronchi.   Procedures performed: Inspection, ET tube was withdrawn to 16 cm mark, visualization of posterior wall of the trachea provided during tracheal puncture, insertion of guidewire and dilatation. Position of tracheostomy tube was confirmed by visualizing carina. Bronchoscope removed.  , Patient placed back on 100% FiO2 at conclusion of procedure.    Evaluation Hemodynamic Status: BP stable throughout; O2 sats: stable throughout Patient's Current Condition: stable Specimens:  None Complications: No apparent complications Patient did tolerate procedure well.   ALVA,RAKESH V. 02/15/2014

## 2014-02-15 NOTE — Progress Notes (Signed)
STROKE TEAM PROGRESS NOTE   HISTORY Summer Bradley is a 68 y.o. female who got up around 4:30 AM 01/29/2014 to go to the bathroom and then sat down in her chair. She is not certain if her left arm was working at that time or not. She then fell out of her chair around 5 AM and that time noticed that her left arm wasn't working. She did not realize that anything was wrong until she fell out of her chair.  She was in her normal state of health ongoing to bed the evening prior. She was last known well at 8p on 01/28/2014.  In the ER, a CT was obtained which shows evidence of a previous stroke, and a CT perfusion scan was obtained which shows a significant mismatch demonstrating an area of penumbra, though there is an smaller area of likely infarct core as well.  Patient was not administered TPA secondary to being  outside of the tPA window with question of subdural hematoma on CT. Due to mismatch, she was taken to neuro intervention where she had complete revascularization of RT MCA M1 occlusion using 2 passes with the Solitaire and 10 mg of IA Integrelin. TICI 3 flow reesablished. Afterwards, she was admitted to the neuro ICU for further evaluation and treatment.   SUBJECTIVE (INTERVAL HISTORY):  No family at bedside. Pt will have both PEG and trach today. Hold off eliquis since yesterday.    OBJECTIVE Temp:  [97.5 F (36.4 C)-98 F (36.7 C)] 97.6 F (36.4 C) (12/31 1200) Pulse Rate:  [52-143] 60 (12/31 1300) Cardiac Rhythm:  [-] Atrial fibrillation (12/31 1200) Resp:  [11-40] 18 (12/31 1300) BP: (79-163)/(47-123) 128/68 mmHg (12/31 1300) SpO2:  [93 %-100 %] 98 % (12/31 1300) FiO2 (%):  [40 %] 40 % (12/31 1200)  No results for input(s): GLUCAP in the last 168 hours.  Recent Labs Lab 02/11/14 0500 02/12/14 0548 02/13/14 0326 02/14/14 0500 02/15/14 0500  NA 139 140 137 140 143  K 3.7 3.9 3.5 3.8 4.0  CL 103 104 100 101 104  CO2 29 28 27  32 33*  GLUCOSE 123* 167* 125* 117* 106*  BUN 20  19 25* 28* 29*  CREATININE 0.76 0.67 0.73 0.78 0.84  CALCIUM 8.4 8.6 8.7 8.3* 8.7   No results for input(s): AST, ALT, ALKPHOS, BILITOT, PROT, ALBUMIN in the last 168 hours.  Recent Labs Lab 02/11/14 0500 02/12/14 0548 02/14/14 0500 02/15/14 0500  WBC 20.2* 17.9* 18.5* 17.8*  NEUTROABS 16.4*  --   --   --   HGB 9.6* 9.5* 9.3* 9.0*  HCT 32.9* 33.2* 31.7* 31.5*  MCV 83.1 81.6 82.6 82.0  PLT 658* 628* 559* 507*   No results for input(s): CKTOTAL, CKMB, CKMBINDEX, TROPONINI in the last 168 hours.  Recent Labs  02/14/14 1300  LABPROT 20.9*  INR 1.78*   No results for input(s): COLORURINE, LABSPEC, PHURINE, GLUCOSEU, HGBUR, BILIRUBINUR, KETONESUR, PROTEINUR, UROBILINOGEN, NITRITE, LEUKOCYTESUR in the last 72 hours.  Invalid input(s): APPERANCEUR     Component Value Date/Time   CHOL 114 01/30/2014 0337   TRIG 102 01/30/2014 0337   HDL 24* 01/30/2014 0337   CHOLHDL 4.8 01/30/2014 0337   VLDL 20 01/30/2014 0337   LDLCALC 70 01/30/2014 0337   Lab Results  Component Value Date   HGBA1C 6.4* 01/30/2014      Component Value Date/Time   LABOPIA NONE DETECTED 01/29/2014 0659   COCAINSCRNUR NONE DETECTED 01/29/2014 [place] DETECTED 01/29/2014 4967  AMPHETMU NONE DETECTED 01/29/2014 0659   THCU NONE DETECTED 01/29/2014 0659   LABBARB NONE DETECTED 01/29/2014 0659    No results for input(s): ETH in the last 168 hours.   Ct Head Wo Contrast  02/01/2014  Stat CT scan shows no acute changes in the brain, but there appears to be a thrombus in the basilar artery that is new.  01/30/2014    1. Resolved right MCA territory hyperdensity present at 1056 hrs yesterday, most likely was post Neurointervention parenchymal contrast staining. 2. Mild cytotoxic edema suspected in that same right MCA territory. Underlying chronic ischemic changes. 3. Stable parafalcine subdural hematoma. No new intracranial hemorrhage or acute mass effect. 4. No new intracranial abnormality.     01/29/2014    Acute infarct right MCA territory as noted on CT perfusion study. There is interval development of high density in the right temporoparietal cortex which may be due to contrast material or hemorrhage. Continued follow-up recommended.  Small interhemispheric subdural hematoma is unchanged.    01/29/2014   1. 4 mm thick posterior falx subdural hemorrhage. 2. Prominent density at the right M1/M2 junction. CTA is already planned. 3. Established infarcts in the anterior and posterior right MCA territory. No definitive acute infarct. 4. Left frontal scalp contusion.  No acute fracture.    Ct Cerebral Perfusion W/cm 01/29/2014    1. Occlusive thrombus at the right M1-M2 junction. Subjectively good pial/pial collateral circulation. 2. Perfusion findings suggest moderate acute infarct in the central right MCA territory with rim of penumbra, as above. 3. Remote infarcts at the anterior and posterior margins of the right MCA territory. 4. Thin posterior falx subdural hemorrhage.     Cerebral Angiogram  01/29/2014 S/P Bilateral common carotid arteriograms, followed by complete revascularization of RT MCA M1 occlusion usingX2 passes with the Solitaire FR 63mm x 31mm retrieval device And 10 mg of  superselective intracranial IA Integrelin. TICI 3 flow reesablished.   MRI brain  02/02/14 1. New, acute infarcts in the posterior circulation related to treated basilar thrombosis. Infarcts present in the bilateral cerebellum, upper right brainstem, mesial right temporal lobe, and minimally along the right occipital cortex. Normal flow voids within the intracranial vessels. 2. Stable appearance of recent right MCA territory infarct. 3. Stable trace falcine subdural hemorrhage.  01/31/14 1. Acute nonhemorrhagic infarct involving the superior right temporal lobe, right insular ribbon, right frontal operculum, and posterior right frontal lobe. 2. More remote infarcts are again noted in the more  anterior inferior right frontal lobe and the right parietal lobe. 3. Extensive white matter changes bilaterally likely reflect the sequela of chronic microvascular ischemia, advanced for age. 4. White matter changes extend into the brainstem. 5. Remote tender infarcts of the cerebellum bilaterally, worse on the right.  Dg Chest Port 1 View 02/08/2014    1. Right PICC line stable position. 2. Congestive heart failure with interstitial edema and small right pleural effusion. Mild improvement from prior exam. 02/10/2014 Improved bibasilar atelectasis versus airspace disease. 02/13/2014 - No pneumothorax status post bilateral chest tube placement. Stable support apparatus  2D Echocardiogram  EF 50-55% with no source of embolus.   A1C 6.4 and LDL 70   Physical Exam General: The patient is awake, following commands. The patient is obese. The patient has ecchymosis and abrasions around the left eye.  Respiratory: Diminished breath sounds at the bilateral bases with rhonchi and wheezing Cardiovascular: irregular rate and rhythm, no murmurs noted Abdomen: Soft Skin: No significant peripheral edema is noted. Neurologic  Exam Mental status: The patient is cooperative, follows commands. Orientated to person, place, time, situation. She is distressed by the mucous plugs and her respiratory condition. Cranial nerves: Pupils are equal, reactive to light. Difficulty with upgaze. Blnk to threat in all quadrants, VFF. Mild left facial asymmetry.  Motor: mild weakness left arm, left arm drift. Bilateral Hip flexion mild weakness. Otherwise intact.  Sensory examination: The patient has decreased sensation on the left arm Coordination: No obvious ataxia seen on the extremities. Difficult to test. Intact FTN, difficult to test HTS.  Gait and station: Gait was not tested. Reflexes: Deep tendon reflexes are brisk throughout.   ASSESSMENT/PLAN Ms. Mega Kinkade is a 68 y.o. female with history of  hypertension, stroke, afib and COPD presenting with Left-sided weakness. She did not receive IV t-PA due to delay in arrival, ? SDH. But underwent mechanical thrombectomy of right MCA with complete vascularization of right MCA M1 occlusion with IA Integrilin. 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 Dr. Estanislado Pandy was able to extract the clot, and the patient has sustained a right pontine and midbrain stroke, small bilateral cerebellar strokes, and some extension of the infarct in the mesial temporal area on the right.   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 Infarcts embolic secondary to Afib with subtherapeutic INR on coumadin.  Resultant left arm weakness and left facial droop  2D Echo  EF 50-55% no source of embolus  HgbA1c 6.4  eliquis po for VTE prophylaxis  aspirin 81 mg orally every day and warfarin prior to admission, changed to eliquis for stroke prevention  Neurologically stable   PT/OT on board and recommend CIR.   Deposition - pending   Acute Respiratory Failure / COPD exacerbation   Intubated for neuro intervention-extubated 02/06/14  Tolerated well 24 hours post extubation.  Once to the floor 12/23, overnight developed more respiratory distress and concerning for aspiration  12/24 Transferred to step down unit for further monitoring  On dysphagia 1 diet with honey thick liquids. Off IVF  Continue nebulization and decrease steroids to Qday.  CCM on board, appreciate their help  Developed the respiratory distress again on 02/13/2014, intubated and transferred to neuro ICU  Trach today  Atrial Fibrillation / A flutter with AV block  Home meds:  Warfarin  INR subtherapeutic on  admission, INR 1.18   started Cardizem and s/p d/c amiodarone.   Rate controlled.  Now on eliquis for stroke prevention   Hypertension  Continue to monitor  BP stable 120-140  On bisoprolol  Hyperlipidemia  Home meds:  Mevacor  LDL 70, goal < 70  On pravastatin  Continue statin on d/c  Dysphagia  Due to concerns of aspiration, she was put on NPO except meds.  Now status changed to dysphagia 1 with honey thick liquids  Developed a respiratory distress again on 02/13/2014, intubated and transferred to neuro ICU. Concerning for aspiration again.   PEG today  Trush  Fluconazole for 14 days  Started on 02/10/2014  Other Stroke Risk Factors  Advanced age  Obesity, Body mass index is 33.5 kg/(m^2).   Hx stroke/TIA  Other Active Problems  Depression, on chronic SSRI  Hypokalemia - 4.0 today  Anemia  Leukocytosis - on solumedrol - afebrile  Hospital day # 17   This patient is critically  ill due to respiratory failure, embolic stroke and at significant risk of neurological and the respiratory worsening, death form respiratory failure, return to stroke. This patient's care requires constant monitoring of vital signs, hemodynamics, respiratory and cardiac monitoring, review of multiple databases, neurological assessment, discussion with family, other specialists and medical decision making of high complexity. I spent 35 minutes of neurocritical care time in the care of this patient.  Rosalin Hawking, MD PhD Stroke Neurology 02/15/2014 1:42 PM   To contact Stroke Continuity provider, please refer to [email or web address] After hours, contact General Neurology

## 2014-02-15 NOTE — Procedures (Signed)
Bedside Tracheostomy Insertion Procedure Note   Patient Details:   Name: Summer Bradley DOB: 1946/01/07 MRN: [id number]  Procedure: Tracheostomy  Pre Procedure Assessment: ET Tube Size:7.5 ET Tube secured at lip (cm):23 Bite block in place: Yes Breath Sounds: Rhonch  Post Procedure Assessment: BP 145/87 mmHg  Pulse 78  Temp(Src) 97.8 F (36.6 C) (Oral)  Resp 13  Ht 5\' 2"  (1.575 m)  Wt 183 lb 3.2 oz (83.1 kg)  BMI 33.50 kg/m2  SpO2 97% O2 sats: stable throughout Complications: No apparent complications Patient did tolerate procedure well Tracheostomy Brand:Shiley Tracheostomy Style:Cuffed Tracheostomy Size: 6.0 Tracheostomy Secured SLH:[id number] and vlecro Tracheostomy Placement Confirmation:Trach cuff visualized and in place  And chest -x-ray   Procedure tolerated wel RT for ICU Kennen Depue,RRT,RCP informed .  No ventilator changes made for procedure.    Andre Lefort Nanette 02/15/2014, 12:03 PM

## 2014-02-15 NOTE — Progress Notes (Signed)
ANTICOAGULATION CONSULT NOTE - Initial Consult  Pharmacy Consult for Apixaban Indication: atrial fibrillation  Allergies  Allergen Reactions  . Lisinopril     Unknown reaction    Patient Measurements: Height: 5\' 2"  (157.5 cm) Weight: 183 lb 3.2 oz (83.1 kg) IBW/kg (Calculated) : 50.1  Vital Signs: Temp: 97.6 F (36.4 C) (12/31 1200) Temp Source: Oral (12/31 1200) BP: 128/68 mmHg (12/31 1300) Pulse Rate: 60 (12/31 1300)  Labs:  Recent Labs  02/13/14 0326 02/14/14 0500 02/14/14 1300 02/15/14 0500  HGB  --  9.3*  --  9.0*  HCT  --  31.7*  --  31.5*  PLT  --  559*  --  507*  APTT  --   --  32  --   LABPROT  --   --  20.9*  --   INR  --   --  1.78*  --   CREATININE 0.73 0.78  --  0.84    Estimated Creatinine Clearance: 63.2 mL/min (by C-G formula based on Cr of 0.84).   Medical History: Past Medical History  Diagnosis Date  . Hypertension   . Stroke   . A-fib   . COPD (chronic obstructive pulmonary disease)     Medications:  Scheduled:  . ampicillin-sulbactam (UNASYN) IV  3 g Intravenous Q6H  . antiseptic oral rinse  7 mL Mouth Rinse QID  . [START ON 02/16/2014] apixaban  5 mg Oral BID  . bisoprolol  2.5 mg Oral BID  . chlorhexidine  15 mL Mouth Rinse BID  . citalopram  20 mg Per Tube QHS  . diltiazem  60 mg Oral 4 times per day  . etomidate  40 mg Intravenous Once  . feeding supplement (PRO-STAT SUGAR FREE 64)  30 mL Per Tube BID  . fentaNYL  100 mcg Intravenous Once  . fentaNYL  200 mcg Intravenous Once  . fentaNYL  50 mcg Intravenous Once  . fluconazole  100 mg Oral QPC supper  . levalbuterol  0.63 mg Nebulization QID  . levothyroxine  150 mcg Per Tube QAC breakfast  . methylPREDNISolone (SOLU-MEDROL) injection  20 mg Intravenous Daily  . pantoprazole sodium  40 mg Per Tube BID  . pravastatin  20 mg Per Tube q1800  . sodium chloride  10-40 mL Intracatheter Q12H  . vecuronium  10 mg Intravenous Once    Assessment: 68 y.o. female on apixaban for  Afib. Apixaban has been held since 12/30 p.m. for trach/peg today - both done. To restart apixaban tomorrow. Hgb low but stable, PLT elevated. No bleeding noted,   Goal of Therapy:  Prevention of stroke Monitor platelets by anticoagulation protocol: Yes   Plan:  1. Apixaban 5mg  po BID 2. Will f/u renal function, s/s bleeding.  Sherlon Handing, PharmD, BCPS Clinical pharmacist, pager [phone] 02/15/2014,1:34 PM

## 2014-02-15 NOTE — Progress Notes (Signed)
RecSLP Cancellation Note  Patient Details Name: Summer Bradley MRN: [id number] DOB: 02-26-1945   Cancelled treatment:        Receiving trach and PEG today. ST follow as part of the trach team.   [name] 02/15/2014, 8:08 AM

## 2014-02-16 ENCOUNTER — Encounter: Payer: Self-pay | Admitting: Internal Medicine

## 2014-02-16 DIAGNOSIS — J9620 Acute and chronic respiratory failure, unspecified whether with hypoxia or hypercapnia: Secondary | ICD-10-CM

## 2014-02-16 DIAGNOSIS — J9621 Acute and chronic respiratory failure with hypoxia: Secondary | ICD-10-CM

## 2014-02-16 DIAGNOSIS — Z930 Tracheostomy status: Secondary | ICD-10-CM

## 2014-02-16 LAB — CBC
HCT: 31.8 % — ABNORMAL LOW (ref 36.0–46.0)
Hemoglobin: 9 g/dL — ABNORMAL LOW (ref 12.0–15.0)
MCH: 23.6 pg — ABNORMAL LOW (ref 26.0–34.0)
MCHC: 28.3 g/dL — ABNORMAL LOW (ref 30.0–36.0)
MCV: 83.2 fL (ref 78.0–100.0)
Platelets: 480 10*3/uL — ABNORMAL HIGH (ref 150–400)
RBC: 3.82 MIL/uL — ABNORMAL LOW (ref 3.87–5.11)
RDW: 19.4 % — ABNORMAL HIGH (ref 11.5–15.5)
WBC: 18.2 10*3/uL — ABNORMAL HIGH (ref 4.0–10.5)

## 2014-02-16 LAB — BASIC METABOLIC PANEL
Anion gap: 9 (ref 5–15)
BUN: 26 mg/dL — ABNORMAL HIGH (ref 6–23)
CO2: 32 mmol/L (ref 19–32)
Calcium: 8.6 mg/dL (ref 8.4–10.5)
Chloride: 104 mEq/L (ref 96–112)
Creatinine, Ser: 0.74 mg/dL (ref 0.50–1.10)
GFR calc Af Amer: >90 mL/min (ref >90)
GFR calc non Af Amer: 85 mL/min — ABNORMAL LOW (ref >90)
Glucose, Bld: 100 mg/dL — ABNORMAL HIGH (ref 70–99)
Potassium: 4.1 mmol/L (ref 3.5–5.1)
Sodium: 145 mmol/L (ref 135–145)

## 2014-02-16 MED ORDER — VITAL AF 1.2 CAL PO LIQD
1000.0000 mL | ORAL | Status: AC
  Administered 2014-02-16 – 2014-02-28 (×10): 1000 mL
  Filled 2014-02-16 (×14): qty 1000

## 2014-02-16 NOTE — Progress Notes (Signed)
PULMONARY / CRITICAL CARE MEDICINE   Name: Summer Bradley MRN: [id number] DOB: 19-May-1945    ADMISSION DATE:  01/29/2014 CONSULTATION DATE:  01/29/2014  REFERRING MD : Neuro  CHIEF COMPLAINT: Left sided weakness  BRIEF 71 yowf  with COPD quit smoking 12 y pta , PAF admitted with acute CVA to Stroke Service after clot retrieval from R MCA   Tubes/Lines: OETT 12/14>>12/15, 12/17 >>12/22, 12/29 >>12/31 Trach 12/31 >> RUE PICC 12/18 >>    STUDIES/SIGNIFICANT EVENTS: 12/14 CTA head: Occlusive thrombus at the right M1-M2 junction. Subjectively good pial/pial collateral circulation. Perfusion findings suggest moderate acute infarct in the central right MCA territory with rim of penumbra, as above. Remote infarcts at the anterior and posterior margins of the right MCA territory. Thin posterior falx subdural hemorrhage  12/14 admitted to stroke  service after Neuro IR clot retrieval from R MCA. Intubated for procedure. PCCM consultation 12/14 CT head: Acute infarct right MCA territory as noted on CT perfusion study. There is interval development of high density in the right  temporoparietal cortex which may be due to contrast material or hemorrhage.  12/14 Echocardiogram: Lvef 50-55% 12/15 Ct head: Mild cytotoxic edema suspected in that same right MCA territory. Underlying chronic ischemic changes. Stable parafalcine subdural hematoma. No new intracranial hemorrhage or acute mass effect. No new intracranial abnormality 12/15 Extubated 12/16 Transferred out of ICU. PCCM signed off 12/16 MRI brain: Acute nonhemorrhagic infarct involving the superior right temporal lobe, right insular ribbon, right frontal operculum, and posterior right frontal lobe. More remote infarcts are again noted in the more anterior inferior right frontal lobe and the right parietal lobe. Extensive white matter changes bilaterally likely reflect the sequela of chronic microvascular ischemia, advanced for age. White matter  changes extend into the brainstem. Remote tender infarcts of the cerebellum bilaterally, worse on the right. 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 Early edema suspected in the thalami right greater than left  12/17 Repeat cerebral angiogram by Neuro IR with clot retrieval. Remained intubated after procedure 12/17 CT head: Large RIGHT MCA territory infarct extending to RIGHT basal ganglia. Underlying atrophy and small vessel chronic ischemic changes. Stable parafalcine subdural hematoma. No new intracranial abnormalities  12/18 failed SBT due to tachypnea and low Vt. Wheezing noted 12/18 MRI brain: New, acute infarcts in the posterior circulation related to treated basilar thrombosis. Infarcts present in the bilateral cerebellum, upper right brainstem, mesial right temporal lobe, and minimally along the right occipital cortex 12/22  extubation.    12/22 failed swallow eval. 12/25 ST recs DI diet/ honey thickened  12/29 resp arrest >>ETT, BL pnthoraces - chest tubes 12/30 rt chest tube out 02/15/14: s/p TRACH -  FEINSTEIN    SUBJECTIVE:   02/16/14:  Doing Trach collar. Follows commands. BAck on apixaban. Daughter at bedside   VITAL SIGNS: Temp:  [97.8 F (36.6 C)-98.5 F (36.9 C)] 98.5 F (36.9 C) (01/01 1544) Pulse Rate:  [51-118] 94 (01/01 1700) Resp:  [9-26] 16 (01/01 1700) BP: (107-168)/(60-100) 131/72 mmHg (01/01 1700) SpO2:  [92 %-100 %] 93 % (01/01 1700) FiO2 (%):  [40 %] 40 % (01/01 1623)   HEMODYNAMICS: VENTILATOR SETTINGS: Vent Mode:  [-] PRVC FiO2 (%):  [40 %] 40 % Set Rate:  [18 bmp] 18 bmp Vt Set:  [340 mL] 340 mL PEEP:  [5 cmH20] 5 cmH20 Plateau Pressure:  [20 cmH20-22 cmH20] 20 cmH20 INTAKE / OUTPUT:  Intake/Output Summary (Last 24 hours) at 02/16/14  [place] filed at 02/16/14 1702  Gross per 24 hour  Intake 1473.54 ml  Output   1332 ml  Net 141.54 ml    PHYSICAL EXAMINATION: General:  Chronically  ill appearing , awake, denies pain  HEENT:  Mucosa somewhat dry. TRAcH SITE LOOKS CLEAN Cardiovascular:  IRIR,   Lungs: Diminished breath sounds , no airl leak Abdomen:  Obese, large midline scar Musculoskeletal:  intact Skin:  Warm , no lower ext edema  LABS: PULMONARY  Recent Labs Lab 02/13/14 1336  PHART 7.297*  PCO2ART 56.0*  PO2ART 81.0  HCO3 27.5*  TCO2 29  O2SAT 94.0    CBC  Recent Labs Lab 02/14/14 0500 02/15/14 0500 02/16/14 0529  HGB 9.3* 9.0* 9.0*  HCT 31.7* 31.5* 31.8*  WBC 18.5* 17.8* 18.2*  PLT 559* 507* 480*    COAGULATION  Recent Labs Lab 02/14/14 1300  INR 1.78*    CARDIAC  No results for input(s): TROPONINI in the last 168 hours. No results for input(s): PROBNP in the last 168 hours.   CHEMISTRY  Recent Labs Lab 02/12/14 0548 02/13/14 0326 02/14/14 0500 02/15/14 0500 02/16/14 0529  NA 140 137 140 143 145  K 3.9 3.5 3.8 4.0 4.1  CL 104 100 101 104 104  CO2 28 27 32 33* 32  GLUCOSE 167* 125* 117* 106* 100*  BUN 19 25* 28* 29* 26*  CREATININE 0.67 0.73 0.78 0.84 0.74  CALCIUM 8.6 8.7 8.3* 8.7 8.6   Estimated Creatinine Clearance: 66.3 mL/min (by C-G formula based on Cr of 0.74).   LIVER  Recent Labs Lab 02/14/14 1300  INR 1.78*     INFECTIOUS No results for input(s): LATICACIDVEN, PROCALCITON in the last 168 hours.   ENDOCRINE CBG (last 3)  No results for input(s): GLUCAP in the last 72 hours.       IMAGING x48h Dg Chest Port 1 View  02/15/2014   CLINICAL DATA:  Acute respiratory failure, new tracheostomy.  EXAM: PORTABLE CHEST - 1 VIEW  COMPARISON:  02/14/2014.  FINDINGS: Tracheostomy is midline. Right PICC tip projects over the SVC. Heart is enlarged, stable. There is left basilar airspace disease. Lucencies projecting along the left infrahilar region may be due to pleural air or overlying subcutaneous emphysema. Left chest tube is in place at the base of the left hemi thorax. Minimal volume loss at the  right lung base. Suspect emphysema. Subcutaneous emphysema is seen along lower left lateral chest wall.  IMPRESSION: 1. Left chest tube in place. Lucencies projecting over the lower left hemi thorax may be due to subcutaneous emphysema. Difficult to definitively exclude a pneumothorax. 2. Left basilar airspace disease, as before.   Electronically Signed   By: Lorin Picket M.D.   On: 02/15/2014 12:17        ASSESSMENT / PLAN: Acute hypoxic resp failure - Suspect aspiration pneumonitis with resp arrest 12/29 BL pneumothorces -resolved Airway plugging 2/2 Aspiration/ retained secretions/ poor cough mechanics VDRF recurrent  COPD with bronchospasm / noct "wheeze" and "choking" likely upper airway  mild pulm edema(better with diuresis)    02/16/14: Tolerating Trach collar all day  - Titrate O2 for sats 90-94% - Humidified O2 - Cont scheduled and PRN levalbuterol nebs -  solumedrol  off -cmonitor on TC for the night- does not seem in distress, but have low threshold to restart vent if needed   HCAP  Sputum + moraxella 12/20 -recurrent aspiration  - IV Unasyn for aspiration12/24 > >>12/27 , 12/29 >>  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 ICU associated discomfort - Stroke mgmt per Stroke team  -HELD apixaban for procedures, can resume after  Dysphagia -   resume TFs 1/1, PEG today     Family - discussed with daughters & sons  at bedside 12/30 with pt involved, agreeable to trach/peg, guarded but optimistic prognosis given to return to previous functioning as noted on 12/28. Plan for LTAC. Daughter updated 02/16/14    Dr. Brand Males, M.D., [facility].C.P Pulmonary and Critical Care Medicine Staff Physician [place] [facility] Pager: [phone], If no answer or between  15:00h - 7:00h: call [phone]  02/16/2014 6:23 PM

## 2014-02-16 NOTE — Clinical Social Work Note (Signed)
Patient still intubated and has a peg tube for feedings.  CSW will continue to follow if patient needs SNF.  Jones Broom. [place], MSW, [place] 02/16/2014 4:34 PM

## 2014-02-16 NOTE — Progress Notes (Signed)
STROKE TEAM PROGRESS NOTE   HISTORY Summer Bradley is a 69 y.o. female who got up around 4:30 AM 01/29/2014 to go to the bathroom and then sat down in her chair. She is not certain if her left arm was working at that time or not. She then fell out of her chair around 5 AM and that time noticed that her left arm wasn't working. She did not realize that anything was wrong until she fell out of her chair.  She was in her normal state of health ongoing to bed the evening prior. She was last known well at 8p on 01/28/2014.  In the ER, a CT was obtained which shows evidence of a previous stroke, and a CT perfusion scan was obtained which shows a significant mismatch demonstrating an area of penumbra, though there is an smaller area of likely infarct core as well.  Patient was not administered TPA secondary to being  outside of the tPA window with question of subdural hematoma on CT. Due to mismatch, she was taken to neuro intervention where she had complete revascularization of RT MCA M1 occlusion using 2 passes with the Solitaire and 10 mg of IA Integrelin. TICI 3 flow reesablished. Afterwards, she was admitted to the neuro ICU for further evaluation and treatment.   SUBJECTIVE (INTERVAL HISTORY):  Son and daughters are at bedside. Pt had both PEG and trach yesterday. Still on vent. Pt awake alert and follows commands. Resume eliquis and tube feeding.    OBJECTIVE Temp:  [97.8 F (36.6 C)-98.5 F (36.9 C)] 98.5 F (36.9 C) (01/01 1544) Pulse Rate:  [51-118] 104 (01/01 1623) Cardiac Rhythm:  [-] Atrial fibrillation (01/01 0800) Resp:  [9-26] 26 (01/01 1623) BP: (100-168)/(52-92) 107/89 mmHg (01/01 1623) SpO2:  [94 %-100 %] 94 % (01/01 1623) FiO2 (%):  [40 %] 40 % (01/01 1623)  No results for input(s): GLUCAP in the last 168 hours.  Recent Labs Lab 02/12/14 0548 02/13/14 0326 02/14/14 0500 02/15/14 0500 02/16/14 0529  NA 140 137 140 143 145  K 3.9 3.5 3.8 4.0 4.1  CL 104 100 101 104 104   CO2 28 27 32 33* 32  GLUCOSE 167* 125* 117* 106* 100*  BUN 19 25* 28* 29* 26*  CREATININE 0.67 0.73 0.78 0.84 0.74  CALCIUM 8.6 8.7 8.3* 8.7 8.6   No results for input(s): AST, ALT, ALKPHOS, BILITOT, PROT, ALBUMIN in the last 168 hours.  Recent Labs Lab 02/11/14 0500 02/12/14 0548 02/14/14 0500 02/15/14 0500 02/16/14 0529  WBC 20.2* 17.9* 18.5* 17.8* 18.2*  NEUTROABS 16.4*  --   --   --   --   HGB 9.6* 9.5* 9.3* 9.0* 9.0*  HCT 32.9* 33.2* 31.7* 31.5* 31.8*  MCV 83.1 81.6 82.6 82.0 83.2  PLT 658* 628* 559* 507* 480*   No results for input(s): CKTOTAL, CKMB, CKMBINDEX, TROPONINI in the last 168 hours.  Recent Labs  02/14/14 1300  LABPROT 20.9*  INR 1.78*   No results for input(s): COLORURINE, LABSPEC, PHURINE, GLUCOSEU, HGBUR, BILIRUBINUR, KETONESUR, PROTEINUR, UROBILINOGEN, NITRITE, LEUKOCYTESUR in the last 72 hours.  Invalid input(s): APPERANCEUR     Component Value Date/Time   CHOL 114 01/30/2014 0337   TRIG 102 01/30/2014 0337   HDL 24* 01/30/2014 0337   CHOLHDL 4.8 01/30/2014 0337   VLDL 20 01/30/2014 0337   LDLCALC 70 01/30/2014 0337   Lab Results  Component Value Date   HGBA1C 6.4* 01/30/2014      Component Value Date/Time  LABOPIA NONE DETECTED 01/29/2014 0659   COCAINSCRNUR NONE DETECTED 01/29/2014 0659   LABBENZ NONE DETECTED 01/29/2014 0659   AMPHETMU NONE DETECTED 01/29/2014 0659   THCU NONE DETECTED 01/29/2014 0659   LABBARB NONE DETECTED 01/29/2014 0659    No results for input(s): ETH in the last 168 hours.   Ct Head Wo Contrast  02/01/2014  Stat CT scan shows no acute changes in the brain, but there appears to be a thrombus in the basilar artery that is new.  01/30/2014    1. Resolved right MCA territory hyperdensity present at 1056 hrs yesterday, most likely was post Neurointervention parenchymal contrast staining. 2. Mild cytotoxic edema suspected in that same right MCA territory. Underlying chronic ischemic changes. 3. Stable parafalcine  subdural hematoma. No new intracranial hemorrhage or acute mass effect. 4. No new intracranial abnormality.    01/29/2014    Acute infarct right MCA territory as noted on CT perfusion study. There is interval development of high density in the right temporoparietal cortex which may be due to contrast material or hemorrhage. Continued follow-up recommended.  Small interhemispheric subdural hematoma is unchanged.    01/29/2014   1. 4 mm thick posterior falx subdural hemorrhage. 2. Prominent density at the right M1/M2 junction. CTA is already planned. 3. Established infarcts in the anterior and posterior right MCA territory. No definitive acute infarct. 4. Left frontal scalp contusion.  No acute fracture.    Ct Cerebral Perfusion W/cm 01/29/2014    1. Occlusive thrombus at the right M1-M2 junction. Subjectively good pial/pial collateral circulation. 2. Perfusion findings suggest moderate acute infarct in the central right MCA territory with rim of penumbra, as above. 3. Remote infarcts at the anterior and posterior margins of the right MCA territory. 4. Thin posterior falx subdural hemorrhage.     Cerebral Angiogram  01/29/2014 S/P Bilateral common carotid arteriograms, followed by complete revascularization of RT MCA M1 occlusion usingX2 passes with the Solitaire FR 64mm x 55mm retrieval device And 10 mg of  superselective intracranial IA Integrelin. TICI 3 flow reesablished.   MRI brain  02/02/14 1. New, acute infarcts in the posterior circulation related to treated basilar thrombosis. Infarcts present in the bilateral cerebellum, upper right brainstem, mesial right temporal lobe, and minimally along the right occipital cortex. Normal flow voids within the intracranial vessels. 2. Stable appearance of recent right MCA territory infarct. 3. Stable trace falcine subdural hemorrhage.  01/31/14 1. Acute nonhemorrhagic infarct involving the superior right temporal lobe, right insular ribbon, right  frontal operculum, and posterior right frontal lobe. 2. More remote infarcts are again noted in the more anterior inferior right frontal lobe and the right parietal lobe. 3. Extensive white matter changes bilaterally likely reflect the sequela of chronic microvascular ischemia, advanced for age. 4. White matter changes extend into the brainstem. 5. Remote tender infarcts of the cerebellum bilaterally, worse on the right.  Dg Chest Port 1 View 02/08/2014    1. Right PICC line stable position. 2. Congestive heart failure with interstitial edema and small right pleural effusion. Mild improvement from prior exam. 02/10/2014 Improved bibasilar atelectasis versus airspace disease. 02/13/2014 - No pneumothorax status post bilateral chest tube placement. Stable support apparatus  2D Echocardiogram  EF 50-55% with no source of embolus.   A1C 6.4 and LDL 70   Physical Exam General: The patient is awake, following commands. The patient is obese. The patient has ecchymosis and abrasions around the left eye.  Respiratory: Diminished breath sounds at the bilateral bases with  rhonchi and wheezing Cardiovascular: irregular rate and rhythm, no murmurs noted Abdomen: Soft Skin: No significant peripheral edema is noted. Neurologic Exam Mental status: The patient is cooperative, follows commands. Orientated to person, place, time, situation. She is distressed by the mucous plugs and her respiratory condition. Cranial nerves: Pupils are equal, reactive to light. Difficulty with upgaze. Blnk to threat in all quadrants, VFF. Mild left facial asymmetry.  Motor: mild weakness left arm, left arm drift. Bilateral Hip flexion mild weakness. Otherwise intact.  Sensory examination: The patient has decreased sensation on the left arm Coordination: No obvious ataxia seen on the extremities. Difficult to test. Intact FTN, difficult to test HTS.  Gait and station: Gait was not tested. Reflexes: Deep tendon reflexes  are brisk throughout.   ASSESSMENT/PLAN Ms. Summer Bradley is a 69 y.o. female with history of hypertension, stroke, afib and COPD presenting with Left-sided weakness. She did not receive IV t-PA due to delay in arrival, ? SDH. But underwent mechanical thrombectomy of right MCA with complete vascularization of right MCA M1 occlusion with IA Integrilin. 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 Dr. Estanislado Pandy was able to extract the clot, and the patient has sustained a right pontine and midbrain stroke, small bilateral cerebellar strokes, and some extension of the infarct in the mesial temporal area on the right.   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 Infarcts embolic secondary to Afib with subtherapeutic INR on coumadin.  Resultant left arm weakness and left facial droop  2D Echo  EF 50-55% no source of embolus  HgbA1c 6.4  eliquis po for VTE prophylaxis  aspirin 81 mg orally every day and warfarin prior to admission, changed to eliquis for stroke prevention. Resume eliquis.   Neurologically stable   PT/OT on board and recommend LTAC  Deposition - pending   Acute Respiratory Failure / COPD exacerbation   Intubated for neuro intervention-extubated 02/06/14  Tolerated well 24 hours post extubation.  Once to the floor 12/23, overnight developed more respiratory distress and concerning for aspiration  12/24 Transferred to step down unit for further monitoring  On dysphagia 1 diet with honey thick liquids. Off IVF  Continue nebulization and decrease steroids to Qday.  CCM on board, appreciate their help  Developed the respiratory distress again on 02/13/2014, intubated and transferred to neuro ICU  Trach  done but still on vent  LTAC placement  Atrial Fibrillation / A flutter with AV block  Home meds:  Warfarin  INR subtherapeutic on admission, INR 1.18   started Cardizem and s/p d/c amiodarone.   Rate controlled.  Resume eliquis for stroke prevention   Hypertension  Continue to monitor  BP stable 120-140  On bisoprolol  Hyperlipidemia  Home meds:  Mevacor  LDL 70, goal < 70  On pravastatin  Continue statin on d/c  Dysphagia  Due to concerns of aspiration, she was put on NPO except meds.  Now status changed to dysphagia 1 with honey thick liquids  Developed a respiratory distress again on 02/13/2014, intubated and transferred to neuro ICU. Concerning for aspiration again.   PEG done and resume tube feeding  Trush  Fluconazole for 14 days  Started on 02/10/2014  Other Stroke Risk Factors  Advanced age  Obesity, Body mass index is 33.5 kg/(m^2).   Hx stroke/TIA  Other Active Problems  Depression, on chronic SSRI  Hypokalemia - 4.1 today  Anemia  Leukocytosis - on solumedrol - afebrile  Hospital day # 18   This patient is critically ill due to respiratory failure, embolic stroke and at significant risk of neurological and the respiratory worsening, death form respiratory failure, return to stroke. This patient's care requires constant monitoring of vital signs, hemodynamics, respiratory and cardiac monitoring, review of multiple databases, neurological assessment, discussion with family, other specialists and medical decision making of high complexity. I spent 35 minutes of neurocritical care time in the care of this patient.  Rosalin Hawking, MD PhD Stroke Neurology 02/16/2014 4:40 PM   To contact Stroke Continuity provider, please refer to [email or web address] After hours, contact General Neurology

## 2014-02-16 NOTE — Progress Notes (Signed)
1 Day Post-Op  Subjective: Mild pain.  No n/v.  Objective: Vital signs in last 24 hours: Temp:  [97.6 F (36.4 C)-97.9 F (36.6 C)] 97.8 F (36.6 C) (01/01 0800) Pulse Rate:  [51-83] 83 (01/01 0800) Resp:  [9-21] 13 (01/01 0800) BP: (100-157)/(47-87) 140/86 mmHg (01/01 0800) SpO2:  [93 %-100 %] 95 % (01/01 0800) FiO2 (%):  [40 %] 40 % (01/01 0800) Last BM Date: 02/13/14  Intake/Output from previous day: 12/31 0701 - 01/01 0700 In: 1438.4 [I.V.:338.4; IV Piggyback:1100] Out: 1087 [Urine:1055; Chest Tube:32] Intake/Output this shift: Total I/O In: 20 [I.V.:20] Out: 45 [Urine:45]  General appearance: alert and no distress Resp: breathing comfortably Cardio: regular rate and rhythm GI: soft, mildly protuberant abdomen, non tender.  Tube intact  Lab Results:   Recent Labs  02/15/14 0500 02/16/14 0529  WBC 17.8* 18.2*  HGB 9.0* 9.0*  HCT 31.5* 31.8*  PLT 507* 480*   BMET  Recent Labs  02/15/14 0500 02/16/14 0529  NA 143 145  K 4.0 4.1  CL 104 104  CO2 33* 32  GLUCOSE 106* 100*  BUN 29* 26*  CREATININE 0.84 0.74  CALCIUM 8.7 8.6   PT/INR  Recent Labs  02/14/14 1300  LABPROT 20.9*  INR 1.78*   ABG  Recent Labs  02/13/14 1336  PHART 7.297*  HCO3 27.5*    Studies/Results: Dg Chest Port 1 View  02/15/2014   CLINICAL DATA:  Acute respiratory failure, new tracheostomy.  EXAM: PORTABLE CHEST - 1 VIEW  COMPARISON:  02/14/2014.  FINDINGS: Tracheostomy is midline. Right PICC tip projects over the SVC. Heart is enlarged, stable. There is left basilar airspace disease. Lucencies projecting along the left infrahilar region may be due to pleural air or overlying subcutaneous emphysema. Left chest tube is in place at the base of the left hemi thorax. Minimal volume loss at the right lung base. Suspect emphysema. Subcutaneous emphysema is seen along lower left lateral chest wall.  IMPRESSION: 1. Left chest tube in place. Lucencies projecting over the lower left  hemi thorax may be due to subcutaneous emphysema. Difficult to definitively exclude a pneumothorax. 2. Left basilar airspace disease, as before.   Electronically Signed   By: Lorin Picket M.D.   On: 02/15/2014 12:17   Dg Chest Port 1 View  02/14/2014   CLINICAL DATA:  Status post right chest tube removal. Evaluate for pneumothorax.  EXAM: PORTABLE CHEST - 1 VIEW  COMPARISON:  Chest radiograph earlier the same day at 05:25 hr  FINDINGS: Portable AP chest radiograph at 17:42: Right-sided chest tube has been removed. No definite pneumothorax. Left-sided chest tube is unchanged in position, tip in the mid lung zone. There is no left pneumothorax. Endotracheal tube remains in place, 2.4 cm from the carina. Enteric tube in place, tip below the diaphragm not included in the field of view. Right upper extremity PICC tip in the SVC. Stable enlargement of the cardiac silhouette. Unchanged interstitial opacities CT the left greater than right lung base.  IMPRESSION: Removal of right-sided chest tube without pneumothorax.   Electronically Signed   By: Jeb Levering M.D.   On: 02/14/2014 18:16    Anti-infectives: Anti-infectives    Start     Dose/Rate Route Frequency Ordered Stop   02/14/14 1100  Ampicillin-Sulbactam (UNASYN) 3 g in sodium chloride 0.9 % 100 mL IVPB     3 g100 mL/hr over 60 Minutes Intravenous Every 6 hours 02/14/14 1021     02/11/14 1800  fluconazole (DIFLUCAN)  tablet 100 mg     100 mg Oral Daily after supper 02/10/14 1734 02/25/14 1759   02/10/14 1800  fluconazole (DIFLUCAN) tablet 200 mg     200 mg Oral  Once 02/10/14 1723 02/10/14 1934   02/10/14 1800  fluconazole (DIFLUCAN) tablet 100 mg  Status:  Discontinued     100 mg Oral Daily after supper 02/10/14 1723 02/10/14 1734   02/08/14 1200  Ampicillin-Sulbactam (UNASYN) 3 g in sodium chloride 0.9 % 100 mL IVPB  Status:  Discontinued     3 g100 mL/hr over 60 Minutes Intravenous Every 6 hours 02/08/14 1115 02/11/14 1000   02/04/14 1000   cefTRIAXone (ROCEPHIN) 1 g in dextrose 5 % 50 mL IVPB - Premix  Status:  Discontinued     1 g100 mL/hr over 30 Minutes Intravenous Every 24 hours 02/04/14 0909 02/07/14 2024   02/01/14 0915  ceFAZolin (ANCEF) IVPB 2 g/50 mL premix     2 g100 mL/hr over 30 Minutes Intravenous  Once 02/01/14 0911 02/01/14 0850   02/01/14 0903  ceFAZolin (ANCEF) 2-3 GM-% IVPB SOLR    Comments:  Novella Rob   : cabinet override      02/01/14 0903 02/01/14 2114   01/29/14 0756  ceFAZolin (ANCEF) 2-3 GM-% IVPB SOLR    Comments:  Reather Converse   : cabinet override      01/29/14 0756 01/29/14 0810      Assessment/Plan: s/p Procedure(s): ESOPHAGOGASTRODUODENOSCOPY (EGD) (N/A) PERCUTANEOUS ENDOSCOPIC GASTROSTOMY (PEG) PLACEMENT (N/A) Tube feeds back to goal.      LOS: 18 days    Zayonna Ayuso 02/16/2014

## 2014-02-17 ENCOUNTER — Inpatient Hospital Stay (HOSPITAL_COMMUNITY): Payer: Medicare HMO

## 2014-02-17 DIAGNOSIS — J9383 Other pneumothorax: Secondary | ICD-10-CM

## 2014-02-17 MED ORDER — WHITE PETROLATUM GEL
Status: AC
  Administered 2014-02-17: 0.2
  Filled 2014-02-17: qty 5

## 2014-02-17 MED ORDER — CLONAZEPAM 0.5 MG PO TABS
0.5000 mg | ORAL_TABLET | Freq: Two times a day (BID) | ORAL | Status: DC
  Administered 2014-02-17 – 2014-02-21 (×9): 0.5 mg via ORAL
  Filled 2014-02-17 (×9): qty 1

## 2014-02-17 NOTE — Progress Notes (Signed)
PULMONARY / CRITICAL CARE MEDICINE   Name: Summer Bradley MRN: [id number] DOB: October 26, 1945    ADMISSION DATE:  01/29/2014 CONSULTATION DATE:  01/29/2014  REFERRING MD : Neuro  CHIEF COMPLAINT: Left sided weakness  BRIEF 61 yowf  with COPD quit smoking 12 y pta , PAF admitted with acute CVA to Stroke Service after clot retrieval from R MCA   Tubes/Lines: OETT 12/14>>12/15, 12/17 >>12/22, 12/29 >>12/31 Trach 12/31 >> RUE PICC 12/18 >>    STUDIES/SIGNIFICANT EVENTS: 12/14 CTA head: Occlusive thrombus at the right M1-M2 junction. Subjectively good pial/pial collateral circulation. Perfusion findings suggest moderate acute infarct in the central right MCA territory with rim of penumbra, as above. Remote infarcts at the anterior and posterior margins of the right MCA territory. Thin posterior falx subdural hemorrhage  12/14 admitted to stroke  service after Neuro IR clot retrieval from R MCA. Intubated for procedure. PCCM consultation 12/14 CT head: Acute infarct right MCA territory as noted on CT perfusion study. There is interval development of high density in the right  temporoparietal cortex which may be due to contrast material or hemorrhage.  12/14 Echocardiogram: Lvef 50-55% 12/15 Ct head: Mild cytotoxic edema suspected in that same right MCA territory. Underlying chronic ischemic changes. Stable parafalcine subdural hematoma. No new intracranial hemorrhage or acute mass effect. No new intracranial abnormality 12/15 Extubated 12/16 Transferred out of ICU. PCCM signed off 12/16 MRI brain: Acute nonhemorrhagic infarct involving the superior right temporal lobe, right insular ribbon, right frontal operculum, and posterior right frontal lobe. More remote infarcts are again noted in the more anterior inferior right frontal lobe and the right parietal lobe. Extensive white matter changes bilaterally likely reflect the sequela of chronic microvascular ischemia, advanced for age. White matter  changes extend into the brainstem. Remote tender infarcts of the cerebellum bilaterally, worse on the right. 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 Early edema suspected in the thalami right greater than left  12/17 Repeat cerebral angiogram by Neuro IR with clot retrieval. Remained intubated after procedure 12/17 CT head: Large RIGHT MCA territory infarct extending to RIGHT basal ganglia. Underlying atrophy and small vessel chronic ischemic changes. Stable parafalcine subdural hematoma. No new intracranial abnormalities  12/18 failed SBT due to tachypnea and low Vt. Wheezing noted 12/18 MRI brain: New, acute infarcts in the posterior circulation related to treated basilar thrombosis. Infarcts present in the bilateral cerebellum, upper right brainstem, mesial right temporal lobe, and minimally along the right occipital cortex 12/22  extubation.    12/22 failed swallow eval. 12/25 ST recs DI diet/ honey thickened  12/29 resp arrest >>ETT, BL pnthoraces - chest tubes 12/30 rt chest tube out 02/15/14: s/p TRACH -  FEINSTEIN    02/16/14:  Doing Trach collar. Follows commands. BAck on apixaban. Daughter at bedside   SUBJECTIVE/OVERNIGHT/INTERVAL HX 02/17/14 - contnues TC without problems. Daughter Jackelyn Poling at bedside - feels mom has has new onset "delirium" since trach. But RN does not report that. Daughter says patient has a blank stare. Patient still has left chest tube   VITAL SIGNS: Temp:  [97.4 F (36.3 C)-98.5 F (36.9 C)] 98.4 F (36.9 C) (01/02 0800) Pulse Rate:  [35-143] 96 (01/02 0859) Resp:  [11-26] 17 (01/02 0859) BP: (105-168)/(54-100) 149/83 mmHg (01/02 0859) SpO2:  [92 %-99 %] 97 % (01/02 0859) FiO2 (%):  [28 %-40 %] 28 % (01/02 0859)   HEMODYNAMICS: VENTILATOR SETTINGS: Vent Mode:  [-]  FiO2 (%):  [28 %-40 %]  28 % INTAKE / OUTPUT:  Intake/Output Summary (Last 24 hours) at 02/17/14 0919 Last data filed at 02/17/14 0600   Gross per 24 hour  Intake 1260.33 ml  Output   1290 ml  Net -29.67 ml    PHYSICAL EXAMINATION: General:  Chronically ill appearing , awake, denies pain  HEENT:  Mucosa somewhat dry. TRAcH SITE LOOKS CLEAN Cardiovascular:  IRIR,   Lungs: Diminished breath sounds , no airl leak Abdomen:  Obese, large midline scar Musculoskeletal:  intact Skin:  Warm , no lower ext edema Neuro: Awake, alert, calm. Movesl all 4s. CAM-ICU negative for delirium   LABS: PULMONARY  Recent Labs Lab 02/13/14 1336  PHART 7.297*  PCO2ART 56.0*  PO2ART 81.0  HCO3 27.5*  TCO2 29  O2SAT 94.0    CBC  Recent Labs Lab 02/14/14 0500 02/15/14 0500 02/16/14 0529  HGB 9.3* 9.0* 9.0*  HCT 31.7* 31.5* 31.8*  WBC 18.5* 17.8* 18.2*  PLT 559* 507* 480*    COAGULATION  Recent Labs Lab 02/14/14 1300  INR 1.78*    CARDIAC  No results for input(s): TROPONINI in the last 168 hours. No results for input(s): PROBNP in the last 168 hours.   CHEMISTRY  Recent Labs Lab 02/12/14 0548 02/13/14 0326 02/14/14 0500 02/15/14 0500 02/16/14 0529  NA 140 137 140 143 145  K 3.9 3.5 3.8 4.0 4.1  CL 104 100 101 104 104  CO2 28 27 32 33* 32  GLUCOSE 167* 125* 117* 106* 100*  BUN 19 25* 28* 29* 26*  CREATININE 0.67 0.73 0.78 0.84 0.74  CALCIUM 8.6 8.7 8.3* 8.7 8.6   Estimated Creatinine Clearance: 66.3 mL/min (by C-G formula based on Cr of 0.74).   LIVER  Recent Labs Lab 02/14/14 1300  INR 1.78*     INFECTIOUS No results for input(s): LATICACIDVEN, PROCALCITON in the last 168 hours.   ENDOCRINE CBG (last 3)  No results for input(s): GLUCAP in the last 72 hours.       IMAGING x48h Dg Chest Port 1 View  02/15/2014   CLINICAL DATA:  Acute respiratory failure, new tracheostomy.  EXAM: PORTABLE CHEST - 1 VIEW  COMPARISON:  02/14/2014.  FINDINGS: Tracheostomy is midline. Right PICC tip projects over the SVC. Heart is enlarged, stable. There is left basilar airspace disease. Lucencies  projecting along the left infrahilar region may be due to pleural air or overlying subcutaneous emphysema. Left chest tube is in place at the base of the left hemi thorax. Minimal volume loss at the right lung base. Suspect emphysema. Subcutaneous emphysema is seen along lower left lateral chest wall.  IMPRESSION: 1. Left chest tube in place. Lucencies projecting over the lower left hemi thorax may be due to subcutaneous emphysema. Difficult to definitively exclude a pneumothorax. 2. Left basilar airspace disease, as before.   Electronically Signed   By: Lorin Picket M.D.   On: 02/15/2014 12:17        ASSESSMENT / PLAN: Acute hypoxic resp failure - Suspect aspiration pneumonitis with resp arrest 12/29 BL pneumothorces -resolved Airway plugging 2/2 Aspiration/ retained secretions/ poor cough mechanics VDRF recurrent  COPD with bronchospasm / noct "wheeze" and "choking" likely upper airway  mild pulm edema(better with diuresis)    02/17/14: Tolerating Trach collar all day since 02/16/14. Stil with left chest tube  PLAN - Titrate O2 for sats 90-94% - Humidified O2 - Cont scheduled and PRN levalbuterol nebs -  solumedrol  off -cmonitor on TC for the night- does not  seem in distress, but have low threshold to restart vent if needed - left chest tube to clamp - and if still expanded 4h later - dc chest tube   HCAP  Sputum + moraxella 12/20 -recurrent aspiration  - IV Unasyn for aspiration12/24 > >>12/27 , 12/29 >>    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 ICU associated discomfort - Stroke mgmt per Stroke team  -HELD apixaban for procedures, can resume after  Dysphagia -   resume TFs 1/1, PEG today     NEURO A: daughter reports confusion. Objectively none  P Recheck labs 02/18/14 Closely monuitor Consider precedex if worse   Family - discussed with daughters & sons  at bedside 12/30 with pt involved, agreeable to trach/peg, guarded but optimistic prognosis  given to return to previous functioning as noted on 12/28. Plan for LTAC. Daughter updated 02/16/14 and 12/16   Dr. Brand Males, M.D., F.C.C.P Pulmonary and Critical Care Medicine Staff Physician [facility] Pager: [phone], If no answer or between  15:00h - 7:00h: call [phone]  02/17/2014 9:19 AM

## 2014-02-17 NOTE — Progress Notes (Signed)
ANTIBIOTIC CONSULT NOTE  Pharmacy Consult for Unasyn Indication: aspiration pneumonia  Allergies  Allergen Reactions  . Lisinopril     Unknown reaction    Patient Measurements: Height: 5\' 2"  (157.5 cm) Weight: 183 lb 3.2 oz (83.1 kg) IBW/kg (Calculated) : 50.1 Adjusted Body Weight: 63.3 kg  Vital Signs: Temp: 97.9 F (36.6 C) (01/02 1152) Temp Source: Axillary (01/02 1152) BP: 144/76 mmHg (01/02 1400) Pulse Rate: 84 (01/02 1400) Intake/Output from previous day: 01/01 0701 - 01/02 0700 In: 1470.3 [I.V.:330; NG/GT:640.3; IV Piggyback:400] Out: 1335 [Urine:1335] Intake/Output from this shift: Total I/O In: 540 [I.V.:30; Other:130; NG/GT:280; IV Piggyback:100] Out: 600 [Urine:600]  Labs:  Recent Labs  02/15/14 0500 02/16/14 0529  WBC 17.8* 18.2*  HGB 9.0* 9.0*  PLT 507* 480*  CREATININE 0.84 0.74   Estimated Creatinine Clearance: 66.3 mL/min (by C-G formula based on Cr of 0.74). No results for input(s): VANCOTROUGH, VANCOPEAK, VANCORANDOM, GENTTROUGH, GENTPEAK, GENTRANDOM, TOBRATROUGH, TOBRAPEAK, TOBRARND, AMIKACINPEAK, AMIKACINTROU, AMIKACIN in the last 72 hours.   Microbiology: Recent Results (from the past 720 hour(s))  MRSA PCR Screening     Status: None   Collection Time: 01/29/14 11:28 AM  Result Value Ref Range Status   MRSA by PCR NEGATIVE NEGATIVE Final    Comment:        The GeneXpert MRSA Assay (FDA approved for NASAL specimens only), is one component of a comprehensive MRSA colonization surveillance program. It is not intended to diagnose MRSA infection nor to guide or monitor treatment for MRSA infections.   Culture, respiratory (NON-Expectorated)     Status: None   Collection Time: 02/04/14 11:30 AM  Result Value Ref Range Status   Specimen Description TRACHEAL ASPIRATE  Final   Special Requests Normal  Final   Gram Stain   Final    ABUNDANT WBC PRESENT, PREDOMINANTLY PMN NO SQUAMOUS EPITHELIAL CELLS SEEN ABUNDANT GRAM NEGATIVE  COCCI Performed at [facility]    Culture   Final    ABUNDANT MORAXELLA CATARRHALIS(BRANHAMELLA) Note: BETA LACTAMASE POSITIVE Performed at [facility]    Report Status 02/06/2014 FINAL  Final  Clostridium Difficile by PCR     Status: None   Collection Time: 02/05/14  3:50 PM  Result Value Ref Range Status   C difficile by pcr NEGATIVE NEGATIVE Final  Culture, respiratory (NON-Expectorated)     Status: None (Preliminary result)   Collection Time: 02/14/14 10:37 AM  Result Value Ref Range Status   Specimen Description TRACHEAL ASPIRATE  Final   Special Requests NONE  Final   Gram Stain   Final    FEW WBC PRESENT,BOTH PMN AND MONONUCLEAR NO SQUAMOUS EPITHELIAL CELLS SEEN NO ORGANISMS SEEN Performed at [facility]    Culture   Final    NO GROWTH 1 DAY Performed at [facility]    Report Status PENDING  Incomplete   Assessment: 69 yo F admitted 01/29/2014 with L sided weakness>>R MCA infarct. On 12/24 pharmacy consulted to dose unasyn for HCAP/aspiration PNA- decision made 12/27 to trial off abx d/t ongoing chronic aspirations and inability to cough up secretions.  Unasyn resumed on 12/30. Currently, afebrile, WBC elevated at 18.2 (remains on methylprednisolone 20mg  IV daily).  12/20 trach aspirate: moraxella cat. 12/30 trach aspirate: NGTD  Plan:  - Continue Unasyn 3g IV Q6h - F/U clinical improvement, renal fxn, LOT  Haasini Patnaude D. Piera Downs, PharmD, BCPS Clinical Pharmacist Pager: [phone] 02/17/2014 2:25 PM

## 2014-02-17 NOTE — Progress Notes (Signed)
STROKE TEAM PROGRESS NOTE   HISTORY Vicci Reder is a 69 y.o. female who got up around 4:30 AM 01/29/2014 to go to the bathroom and then sat down in her chair. She is not certain if her left arm was working at that time or not. She then fell out of her chair around 5 AM and that time noticed that her left arm wasn't working. She did not realize that anything was wrong until she fell out of her chair.  She was in her normal state of health ongoing to bed the evening prior. She was last known well at 8p on 01/28/2014.  In the ER, a CT was obtained which shows evidence of a previous stroke, and a CT perfusion scan was obtained which shows a significant mismatch demonstrating an area of penumbra, though there is an smaller area of likely infarct core as well.  Patient was not administered TPA secondary to being  outside of the tPA window with question of subdural hematoma on CT. Due to mismatch, she was taken to neuro intervention where she had complete revascularization of RT MCA M1 occlusion using 2 passes with the Solitaire and 10 mg of IA Integrelin. TICI 3 flow reesablished. Afterwards, she was admitted to the neuro ICU for further evaluation and treatment.   SUBJECTIVE (INTERVAL HISTORY):  Son and daughters are at bedside. Pt had both PEG and trach yesterday. Still on vent. Pt awake alert and follows commands. Resume eliquis and tube feeding.    OBJECTIVE Temp:  [97.4 F (36.3 C)-98.5 F (36.9 C)] 98.4 F (36.9 C) (01/02 0800) Pulse Rate:  [35-143] 96 (01/02 0859) Cardiac Rhythm:  [-] Atrial fibrillation (01/01 2000) Resp:  [11-26] 17 (01/02 0859) BP: (105-168)/(54-100) 149/83 mmHg (01/02 0859) SpO2:  [92 %-99 %] 97 % (01/02 0859) FiO2 (%):  [28 %-40 %] 28 % (01/02 0859)  No results for input(s): GLUCAP in the last 168 hours.  Recent Labs Lab 02/12/14 0548 02/13/14 0326 02/14/14 0500 02/15/14 0500 02/16/14 0529  NA 140 137 140 143 145  K 3.9 3.5 3.8 4.0 4.1  CL 104 100 101 104  104  CO2 28 27 32 33* 32  GLUCOSE 167* 125* 117* 106* 100*  BUN 19 25* 28* 29* 26*  CREATININE 0.67 0.73 0.78 0.84 0.74  CALCIUM 8.6 8.7 8.3* 8.7 8.6   No results for input(s): AST, ALT, ALKPHOS, BILITOT, PROT, ALBUMIN in the last 168 hours.  Recent Labs Lab 02/11/14 0500 02/12/14 0548 02/14/14 0500 02/15/14 0500 02/16/14 0529  WBC 20.2* 17.9* 18.5* 17.8* 18.2*  NEUTROABS 16.4*  --   --   --   --   HGB 9.6* 9.5* 9.3* 9.0* 9.0*  HCT 32.9* 33.2* 31.7* 31.5* 31.8*  MCV 83.1 81.6 82.6 82.0 83.2  PLT 658* 628* 559* 507* 480*   No results for input(s): CKTOTAL, CKMB, CKMBINDEX, TROPONINI in the last 168 hours.  Recent Labs  02/14/14 1300  LABPROT 20.9*  INR 1.78*   No results for input(s): COLORURINE, LABSPEC, PHURINE, GLUCOSEU, HGBUR, BILIRUBINUR, KETONESUR, PROTEINUR, UROBILINOGEN, NITRITE, LEUKOCYTESUR in the last 72 hours.  Invalid input(s): APPERANCEUR     Component Value Date/Time   CHOL 114 01/30/2014 0337   TRIG 102 01/30/2014 0337   HDL 24* 01/30/2014 0337   CHOLHDL 4.8 01/30/2014 0337   VLDL 20 01/30/2014 0337   LDLCALC 70 01/30/2014 0337   Lab Results  Component Value Date   HGBA1C 6.4* 01/30/2014      Component Value Date/Time  LABOPIA NONE DETECTED 01/29/2014 0659   COCAINSCRNUR NONE DETECTED 01/29/2014 0659   LABBENZ NONE DETECTED 01/29/2014 0659   AMPHETMU NONE DETECTED 01/29/2014 0659   THCU NONE DETECTED 01/29/2014 0659   LABBARB NONE DETECTED 01/29/2014 0659    No results for input(s): ETH in the last 168 hours.   Ct Head Wo Contrast  02/01/2014  Stat CT scan shows no acute changes in the brain, but there appears to be a thrombus in the basilar artery that is new.  01/30/2014    1. Resolved right MCA territory hyperdensity present at 1056 hrs yesterday, most likely was post Neurointervention parenchymal contrast staining. 2. Mild cytotoxic edema suspected in that same right MCA territory. Underlying chronic ischemic changes. 3. Stable  parafalcine subdural hematoma. No new intracranial hemorrhage or acute mass effect. 4. No new intracranial abnormality.    01/29/2014    Acute infarct right MCA territory as noted on CT perfusion study. There is interval development of high density in the right temporoparietal cortex which may be due to contrast material or hemorrhage. Continued follow-up recommended.  Small interhemispheric subdural hematoma is unchanged.    01/29/2014   1. 4 mm thick posterior falx subdural hemorrhage. 2. Prominent density at the right M1/M2 junction. CTA is already planned. 3. Established infarcts in the anterior and posterior right MCA territory. No definitive acute infarct. 4. Left frontal scalp contusion.  No acute fracture.    Ct Cerebral Perfusion W/cm 01/29/2014    1. Occlusive thrombus at the right M1-M2 junction. Subjectively good pial/pial collateral circulation. 2. Perfusion findings suggest moderate acute infarct in the central right MCA territory with rim of penumbra, as above. 3. Remote infarcts at the anterior and posterior margins of the right MCA territory. 4. Thin posterior falx subdural hemorrhage.     Cerebral Angiogram  01/29/2014 S/P Bilateral common carotid arteriograms, followed by complete revascularization of RT MCA M1 occlusion usingX2 passes with the Solitaire FR 55mm x 44mm retrieval device And 10 mg of  superselective intracranial IA Integrelin. TICI 3 flow reesablished.   MRI brain  02/02/14 1. New, acute infarcts in the posterior circulation related to treated basilar thrombosis. Infarcts present in the bilateral cerebellum, upper right brainstem, mesial right temporal lobe, and minimally along the right occipital cortex. Normal flow voids within the intracranial vessels. 2. Stable appearance of recent right MCA territory infarct. 3. Stable trace falcine subdural hemorrhage.  01/31/14 1. Acute nonhemorrhagic infarct involving the superior right temporal lobe, right insular  ribbon, right frontal operculum, and posterior right frontal lobe. 2. More remote infarcts are again noted in the more anterior inferior right frontal lobe and the right parietal lobe. 3. Extensive white matter changes bilaterally likely reflect the sequela of chronic microvascular ischemia, advanced for age. 4. White matter changes extend into the brainstem. 5. Remote tender infarcts of the cerebellum bilaterally, worse on the right.  Dg Chest Port 1 View 02/08/2014    1. Right PICC line stable position. 2. Congestive heart failure with interstitial edema and small right pleural effusion. Mild improvement from prior exam. 02/10/2014 Improved bibasilar atelectasis versus airspace disease. 02/13/2014 - No pneumothorax status post bilateral chest tube placement. Stable support apparatus  2D Echocardiogram  EF 50-55% with no source of embolus.   A1C 6.4 and LDL 70   Physical Exam General: The patient is awake, following commands. The patient is obese. The patient has ecchymosis and abrasions around the left eye.  Respiratory: Diminished breath sounds at the bilateral bases with  rhonchi and wheezing Cardiovascular: irregular rate and rhythm, no murmurs noted Abdomen: Soft Skin: No significant peripheral edema is noted. Neurologic Exam Mental status: The patient is cooperative, follows commands. Orientated to person, place, time, situation. She is distressed by the mucous plugs and her respiratory condition. Cranial nerves: Pupils are equal, reactive to light. Difficulty with upgaze. Blnk to threat in all quadrants, VFF. Mild left facial asymmetry.  Motor: mild weakness left arm, left arm drift. Bilateral Hip flexion mild weakness. Otherwise intact.  Sensory examination: The patient has decreased sensation on the left arm Coordination: No obvious ataxia seen on the extremities. Difficult to test. Intact FTN, difficult to test HTS.  Gait and station: Gait was not tested. Reflexes: Deep  tendon reflexes are brisk throughout.   ASSESSMENT/PLAN Ms. Amorie Rentz is a 69 y.o. female with history of hypertension, stroke, afib and COPD presenting with Left-sided weakness. She did not receive IV t-PA due to delay in arrival, ? SDH. But underwent mechanical thrombectomy of right MCA with complete vascularization of right MCA M1 occlusion with IA Integrilin. 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 Dr. Estanislado Pandy was able to extract the clot, and the patient has sustained a right pontine and midbrain stroke, small bilateral cerebellar strokes, and some extension of the infarct in the mesial temporal area on the right.   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 Infarcts embolic secondary to Afib with subtherapeutic INR on coumadin.  Resultant left arm weakness and left facial droop  2D Echo  EF 50-55% no source of embolus  HgbA1c 6.4  eliquis po for VTE prophylaxis  aspirin 81 mg orally every day and warfarin prior to admission, changed to eliquis for stroke prevention. Resume eliquis.   Neurologically stable   PT/OT on board and recommend LTAC  Deposition - pending   Acute Respiratory Failure / COPD exacerbation   Intubated for neuro intervention-extubated 02/06/14  Tolerated well 24 hours post extubation.  Once to the floor 12/23, overnight developed more respiratory distress and concerning for aspiration  12/24 Transferred to step down unit for further monitoring  On dysphagia 1 diet with honey thick liquids. Off IVF  Continue nebulization and decrease steroids to Qday.  CCM on board, appreciate their help  Developed the respiratory distress again on 02/13/2014, intubated and transferred to  neuro ICU  Trach done. Currently off the vent for 24 hours doing well  LTAC placement  Atrial Fibrillation / A flutter with AV block  Home meds:  Warfarin  INR subtherapeutic on admission, INR 1.18   started Cardizem and s/p d/c amiodarone.   Rate controlled.  Resume eliquis for stroke prevention  Anxiety - Currently on Fentanyl at 160mcg/hr, will titrate down  - start baseline long benzo Klonopin .5 q12.    Hypertension  Continue to monitor  BP stable 120-140  On bisoprolol  Hyperlipidemia  Home meds:  Mevacor  LDL 70, goal < 70  On pravastatin  Continue statin on d/c  Dysphagia  Due to concerns of aspiration, she was put on NPO except meds.  Now status changed to dysphagia 1 with honey thick liquids  Developed a respiratory distress again on 02/13/2014, intubated and transferred to neuro ICU. Concerning for aspiration again.  PEG done. TF restarted Trush  Fluconazole for 14 days  Started on  02/10/2014  Other Stroke Risk Factors  Advanced age  Obesity, Body mass index is 33.5 kg/(m^2).   Hx stroke/TIA  Other Active Problems  Depression, on chronic SSRI  Anemia  Leukocytosis - on solumedrol - afebrile  Hospital day # 19   This patient is critically ill due to respiratory failure, embolic stroke and at significant risk of neurological and the respiratory worsening, death form respiratory failure, return to stroke. This patient's care requires constant monitoring of vital signs, hemodynamics, respiratory and cardiac monitoring, review of multiple databases, neurological assessment, discussion with family, other specialists and medical decision making of high complexity. I spent 45 minutes of neurocritical care time in the care of this patient.  Leotis Pain  Stroke Neurology 02/17/2014 9:48 AM   To contact Stroke Continuity provider, please refer to [email or web address] After hours, contact General Neurology

## 2014-02-17 NOTE — Progress Notes (Signed)
Patient ID: Summer Bradley, female   DOB: [birth year], 69 y.o.   MRN: [id number] 2 Days Post-Op  Subjective: Tube feeds at goal.  Pt remains slightly confused.  Objective: Vital signs in last 24 hours: Temp:  [97.4 F (36.3 C)-98.5 F (36.9 C)] 98.4 F (36.9 C) (01/02 0800) Pulse Rate:  [35-143] 96 (01/02 0859) Resp:  [11-26] 17 (01/02 0859) BP: (105-168)/(54-100) 149/83 mmHg (01/02 0859) SpO2:  [92 %-99 %] 97 % (01/02 0859) FiO2 (%):  [28 %-40 %] 28 % (01/02 0859) Last BM Date: 02/13/14  Intake/Output from previous day: 01/01 0701 - 01/02 0700 In: 1310.3 [I.V.:320; NG/GT:600.3; IV Piggyback:300] Out: 1335 [Urine:1335] Intake/Output this shift:    General appearance: alert and no distress Resp: breathing comfortably Cardio: regular rate and rhythm GI: soft, mildly protuberant abdomen, non tender.  Tube intact  Lab Results:   Recent Labs  02/15/14 0500 02/16/14 0529  WBC 17.8* 18.2*  HGB 9.0* 9.0*  HCT 31.5* 31.8*  PLT 507* 480*   BMET  Recent Labs  02/15/14 0500 02/16/14 0529  NA 143 145  K 4.0 4.1  CL 104 104  CO2 33* 32  GLUCOSE 106* 100*  BUN 29* 26*  CREATININE 0.84 0.74  CALCIUM 8.7 8.6   PT/INR  Recent Labs  02/14/14 1300  LABPROT 20.9*  INR 1.78*   ABG No results for input(s): PHART, HCO3 in the last 72 hours.  Invalid input(s): PCO2, PO2  Studies/Results: Dg Chest Port 1 View  02/15/2014   CLINICAL DATA:  Acute respiratory failure, new tracheostomy.  EXAM: PORTABLE CHEST - 1 VIEW  COMPARISON:  02/14/2014.  FINDINGS: Tracheostomy is midline. Right PICC tip projects over the SVC. Heart is enlarged, stable. There is left basilar airspace disease. Lucencies projecting along the left infrahilar region may be due to pleural air or overlying subcutaneous emphysema. Left chest tube is in place at the base of the left hemi thorax. Minimal volume loss at the right lung base. Suspect emphysema. Subcutaneous emphysema is seen along lower left lateral  chest wall.  IMPRESSION: 1. Left chest tube in place. Lucencies projecting over the lower left hemi thorax may be due to subcutaneous emphysema. Difficult to definitively exclude a pneumothorax. 2. Left basilar airspace disease, as before.   Electronically Signed   By: Lorin Picket M.D.   On: 02/15/2014 12:17    Anti-infectives: Anti-infectives    Start     Dose/Rate Route Frequency Ordered Stop   02/14/14 1100  Ampicillin-Sulbactam (UNASYN) 3 g in sodium chloride 0.9 % 100 mL IVPB     3 g100 mL/hr over 60 Minutes Intravenous Every 6 hours 02/14/14 1021     02/11/14 1800  fluconazole (DIFLUCAN) tablet 100 mg     100 mg Oral Daily after supper 02/10/14 1734 02/25/14 1759   02/10/14 1800  fluconazole (DIFLUCAN) tablet 200 mg     200 mg Oral  Once 02/10/14 1723 02/10/14 1934   02/10/14 1800  fluconazole (DIFLUCAN) tablet 100 mg  Status:  Discontinued     100 mg Oral Daily after supper 02/10/14 1723 02/10/14 1734   02/08/14 1200  Ampicillin-Sulbactam (UNASYN) 3 g in sodium chloride 0.9 % 100 mL IVPB  Status:  Discontinued     3 g100 mL/hr over 60 Minutes Intravenous Every 6 hours 02/08/14 1115 02/11/14 1000   02/04/14 1000  cefTRIAXone (ROCEPHIN) 1 g in dextrose 5 % 50 mL IVPB - Premix  Status:  Discontinued     1 g100 mL/hr over  30 Minutes Intravenous Every 24 hours 02/04/14 0909 02/07/14 2024   02/01/14 0915  ceFAZolin (ANCEF) IVPB 2 g/50 mL premix     2 g100 mL/hr over 30 Minutes Intravenous  Once 02/01/14 0911 02/01/14 0850   02/01/14 0903  ceFAZolin (ANCEF) 2-3 GM-% IVPB SOLR    Comments:  Novella Rob   : cabinet override      02/01/14 0903 02/01/14 2114   01/29/14 0756  ceFAZolin (ANCEF) 2-3 GM-% IVPB SOLR    Comments:  Reather Converse   : cabinet override      01/29/14 0756 01/29/14 0810      Assessment/Plan: s/p Procedure(s): ESOPHAGOGASTRODUODENOSCOPY (EGD) (N/A) PERCUTANEOUS ENDOSCOPIC GASTROSTOMY (PEG) PLACEMENT (N/A) Tolerating feeds at goal.   Call for issues.    LOS:  19 days    Summer Bradley 02/17/2014

## 2014-02-17 NOTE — Progress Notes (Signed)
[facility] Progress Note Patient Name: Summer Bradley DOB: 03-26-1945 MRN: [id number]   Date of Service  02/17/2014  HPI/Events of Note  Repeat cxr to eval for pneumothorax after clamping chest tube  eICU Interventions  No evidence of pneumo, order to d/c chest tube placed, nurse will remove.      Intervention Category Major Interventions: OtherGuy Begin. 02/17/2014, 5:34 PM

## 2014-02-18 DIAGNOSIS — J9620 Acute and chronic respiratory failure, unspecified whether with hypoxia or hypercapnia: Secondary | ICD-10-CM

## 2014-02-18 LAB — CBC WITH DIFFERENTIAL/PLATELET
Basophils Absolute: 0 10*3/uL (ref 0.0–0.1)
Basophils Relative: 0 % (ref 0–1)
Eosinophils Absolute: 0 10*3/uL (ref 0.0–0.7)
Eosinophils Relative: 0 % (ref 0–5)
HCT: 32.8 % — ABNORMAL LOW (ref 36.0–46.0)
Hemoglobin: 9.2 g/dL — ABNORMAL LOW (ref 12.0–15.0)
Lymphocytes Relative: 6 % — ABNORMAL LOW (ref 12–46)
Lymphs Abs: 1.2 10*3/uL (ref 0.7–4.0)
MCH: 22.9 pg — ABNORMAL LOW (ref 26.0–34.0)
MCHC: 28 g/dL — ABNORMAL LOW (ref 30.0–36.0)
MCV: 81.8 fL (ref 78.0–100.0)
Monocytes Absolute: 1.7 10*3/uL — ABNORMAL HIGH (ref 0.1–1.0)
Monocytes Relative: 9 % (ref 3–12)
Neutro Abs: 17.5 10*3/uL — ABNORMAL HIGH (ref 1.7–7.7)
Neutrophils Relative %: 85 % — ABNORMAL HIGH (ref 43–77)
Platelets: 435 10*3/uL — ABNORMAL HIGH (ref 150–400)
RBC: 4.01 MIL/uL (ref 3.87–5.11)
RDW: 20 % — ABNORMAL HIGH (ref 11.5–15.5)
WBC: 20.5 10*3/uL — ABNORMAL HIGH (ref 4.0–10.5)

## 2014-02-18 LAB — BASIC METABOLIC PANEL
Anion gap: 3 — ABNORMAL LOW (ref 5–15)
BUN: 23 mg/dL (ref 6–23)
CO2: 37 mmol/L — ABNORMAL HIGH (ref 19–32)
Calcium: 8.6 mg/dL (ref 8.4–10.5)
Chloride: 103 mEq/L (ref 96–112)
Creatinine, Ser: 0.49 mg/dL — ABNORMAL LOW (ref 0.50–1.10)
GFR calc Af Amer: >90 mL/min (ref >90)
GFR calc non Af Amer: >90 mL/min (ref >90)
Glucose, Bld: 128 mg/dL — ABNORMAL HIGH (ref 70–99)
Potassium: 3.5 mmol/L (ref 3.5–5.1)
Sodium: 143 mmol/L (ref 135–145)

## 2014-02-18 LAB — MAGNESIUM: Magnesium: 2.1 mg/dL (ref 1.5–2.5)

## 2014-02-18 LAB — PROCALCITONIN: Procalcitonin: <0.1 ng/mL

## 2014-02-18 LAB — CULTURE, RESPIRATORY: Culture: NO GROWTH

## 2014-02-18 LAB — CULTURE, RESPIRATORY W GRAM STAIN

## 2014-02-18 LAB — BRAIN NATRIURETIC PEPTIDE: B Natriuretic Peptide: 987.8 pg/mL — ABNORMAL HIGH (ref 0.0–100.0)

## 2014-02-18 LAB — PHOSPHORUS: Phosphorus: 2.2 mg/dL — ABNORMAL LOW (ref 2.3–4.6)

## 2014-02-18 MED ORDER — DEXTROSE 5 % IV SOLN
10.0000 mmol | Freq: Once | INTRAVENOUS | Status: AC
  Administered 2014-02-18: 10 mmol via INTRAVENOUS
  Filled 2014-02-18: qty 3.33

## 2014-02-18 NOTE — Progress Notes (Signed)
246ml fentanyl wasted with Denyse Amass, RN in sink.

## 2014-02-18 NOTE — Progress Notes (Signed)
Speech Language Pathology Patient Details Name: Summer Bradley MRN: [id number] DOB: June 06, 1945 Today's Date: 02/18/2014 Time:  -      PMSV order received. Will initiate 1/4.       Orbie Pyo [place] [phone]

## 2014-02-18 NOTE — Progress Notes (Addendum)
PULMONARY / CRITICAL CARE MEDICINE   Name: Summer Bradley MRN: [id number] DOB: [birth year]    ADMISSION DATE:  01/29/2014 CONSULTATION DATE:  01/29/2014  REFERRING MD : Neuro  CHIEF COMPLAINT: Left sided weakness  BRIEF 14 yowf  with COPD quit smoking 12 y pta , PAF admitted with acute CVA to Stroke Service after clot retrieval from R MCA   Tubes/Lines: OETT 12/14>>12/15, 12/17 >>12/22, 12/29 >>12/31 Trach 12/31 >> RUE PICC 12/18 >>    STUDIES/SIGNIFICANT EVENTS: 12/14 CTA head: Occlusive thrombus at the right M1-M2 junction. Subjectively good pial/pial collateral circulation. Perfusion findings suggest moderate acute infarct in the central right MCA territory with rim of penumbra, as above. Remote infarcts at the anterior and posterior margins of the right MCA territory. Thin posterior falx subdural hemorrhage  12/14 admitted to stroke  service after Neuro IR clot retrieval from R MCA. Intubated for procedure. PCCM consultation 12/14 CT head: Acute infarct right MCA territory as noted on CT perfusion study. There is interval development of high density in the right  temporoparietal cortex which may be due to contrast material or hemorrhage.  12/14 Echocardiogram: Lvef 50-55% 12/15 Ct head: Mild cytotoxic edema suspected in that same right MCA territory. Underlying chronic ischemic changes. Stable parafalcine subdural hematoma. No new intracranial hemorrhage or acute mass effect. No new intracranial abnormality 12/15 Extubated 12/16 Transferred out of ICU. PCCM signed off 12/16 MRI brain: Acute nonhemorrhagic infarct involving the superior right temporal lobe, right insular ribbon, right frontal operculum, and posterior right frontal lobe. More remote infarcts are again noted in the more anterior inferior right frontal lobe and the right parietal lobe. Extensive white matter changes bilaterally likely reflect the sequela of chronic microvascular ischemia, advanced for age. White matter  changes extend into the brainstem. Remote tender infarcts of the cerebellum bilaterally, worse on the right. 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 Early edema suspected in the thalami right greater than left  12/17 Repeat cerebral angiogram by Neuro IR with clot retrieval. Remained intubated after procedure 12/17 CT head: Large RIGHT MCA territory infarct extending to RIGHT basal ganglia. Underlying atrophy and small vessel chronic ischemic changes. Stable parafalcine subdural hematoma. No new intracranial abnormalities  12/18 failed SBT due to tachypnea and low Vt. Wheezing noted 12/18 MRI brain: New, acute infarcts in the posterior circulation related to treated basilar thrombosis. Infarcts present in the bilateral cerebellum, upper right brainstem, mesial right temporal lobe, and minimally along the right occipital cortex 12/22  extubation.    12/22 failed swallow eval. 12/25 ST recs DI diet/ honey thickened  12/29 resp arrest >>ETT, BL pnthoraces - chest tubes 12/30 rt chest tube out 02/15/14: s/p TRACH -  FEINSTEIN    02/16/14:  Doing Trach collar. Follows commands. BAck on apixaban. Daughter at bedside 02/17/14 - contnues TC without problems. Daughter Jackelyn Poling at bedside - feels mom has has new onset "delirium" since trach. But RN does not report that. Daughter says patient has a blank stare. Left chest tube out   SUBJECTIVE/OVERNIGHT/INTERVAL HX 13/16 - daughter Jackelyn Poling feels confusion improved. Tolerating TC fine since 02/16/14. Left chest tube out yesterday. Foley out. Neuro plans Tx to SDU  VITAL SIGNS: Temp:  [97.5 F (36.4 C)-98.3 F (36.8 C)] 98.2 F (36.8 C) (01/03 0800) Pulse Rate:  [39-148] 88 (01/03 0808) Resp:  [18-30] 24 (01/03 0808) BP: (123-159)/(68-103) 137/68 mmHg (01/03 0808) SpO2:  [90 %-100 %] 97 % (01/03 0808) FiO2 (%):  [28 %]  28 % (01/03 0808)   HEMODYNAMICS: VENTILATOR SETTINGS: Vent Mode:  [-]  FiO2 (%):   [28 %] 28 % INTAKE / OUTPUT:  Intake/Output Summary (Last 24 hours) at 02/18/14 0942 Last data filed at 02/18/14 0758  Gross per 24 hour  Intake 1438.67 ml  Output    655 ml  Net 783.67 ml    PHYSICAL EXAMINATION: General:  Chronically ill appearing , awake, denies pain  HEENT:  Mucosa somewhat dry. TRAcH SITE LOOKS CLEAN Cardiovascular:  IRIR,   Lungs: Diminished breath sounds , no airl leak Abdomen:  Obese, large midline scar Musculoskeletal:  intact Skin:  Warm , no lower ext edema Neuro: Awake, alert, calm. Movesl all 4s. CAM-ICU negative for delirium   LABS: PULMONARY  Recent Labs Lab 02/13/14 1336  PHART 7.297*  PCO2ART 56.0*  PO2ART 81.0  HCO3 27.5*  TCO2 29  O2SAT 94.0    CBC  Recent Labs Lab 02/15/14 0500 02/16/14 0529 02/18/14 0510  HGB 9.0* 9.0* 9.2*  HCT 31.5* 31.8* 32.8*  WBC 17.8* 18.2* 20.5*  PLT 507* 480* 435*    COAGULATION  Recent Labs Lab 02/14/14 1300  INR 1.78*    CARDIAC  No results for input(s): TROPONINI in the last 168 hours. No results for input(s): PROBNP in the last 168 hours.   CHEMISTRY  Recent Labs Lab 02/13/14 0326 02/14/14 0500 02/15/14 0500 02/16/14 0529 02/18/14 0510  NA 137 140 143 145 143  K 3.5 3.8 4.0 4.1 3.5  CL 100 101 104 104 103  CO2 27 32 33* 32 37*  GLUCOSE 125* 117* 106* 100* 128*  BUN 25* 28* 29* 26* 23  CREATININE 0.73 0.78 0.84 0.74 0.49*  CALCIUM 8.7 8.3* 8.7 8.6 8.6  MG  --   --   --   --  2.1  PHOS  --   --   --   --  2.2*   Estimated Creatinine Clearance: 66.3 mL/min (by C-G formula based on Cr of 0.49).   LIVER  Recent Labs Lab 02/14/14 1300  INR 1.78*     INFECTIOUS No results for input(s): LATICACIDVEN, PROCALCITON in the last 168 hours.   ENDOCRINE CBG (last 3)  No results for input(s): GLUCAP in the last 72 hours.       IMAGING x48h Dg Chest Port 1 View  02/17/2014   CLINICAL DATA:  Initial encounter for chest tube removal  EXAM: PORTABLE CHEST - 1 VIEW   COMPARISON:  Earlier the same day  FINDINGS: 1816 hrs. Left chest tube is been removed in the interval. Lucency at the left base may be related to a small residual anterior left-sided pneumothorax. Right PICC line tip overlies the proximal to mid SVC. Tracheostomy tube remains in place. Sir Cardiomegaly  IMPRESSION: Possible small anterior left pneumothorax status post chest tube removal.   Electronically Signed   By: Misty Stanley M.D.   On: 02/17/2014 18:34   Dg Chest Port 1 View  02/17/2014   CLINICAL DATA:  Evaluate chest tube placement. COPD. Stroke. Hypertension.  EXAM: PORTABLE CHEST - 1 VIEW  COMPARISON:  Earlier today at 1106 hr.  FINDINGS: 1644 hr. Left-sided chest tube is unchanged in position. Tracheostomy appropriately positioned. Right-sided PICC line unchanged with tip at low SVC.  Numerous leads and wires project over the chest. Cardiomegaly accentuated by AP portable technique. No pleural effusion or pneumothorax. Improved bibasilar aeration, with left greater than right airspace disease remaining.  IMPRESSION: Left chest tube remaining in place,  without pneumothorax.  Improving bibasilar aeration.   Electronically Signed   By: Abigail Miyamoto M.D.   On: 02/17/2014 16:59   Dg Chest Port 1 View  02/17/2014   CLINICAL DATA:  Followup left basilar atelectasis versus pneumonia. Left chest tube in place. Chronic ventilator dependent respiratory failure.  EXAM: PORTABLE CHEST - 1 VIEW  COMPARISON:  02/15/2014 dating back to 02/08/2014.  FINDINGS: Tracheostomy tube tip in satisfactory position approximately 5 cm above the carina. Left chest tube in place with no pneumothorax. Right arm PICC tip projects over the lower SVC. Minimal subcutaneous emphysema in the left chest wall, significantly improved. Cardiac silhouette enlarged but stable. Pulmonary vascularity normal. Patchy airspace opacity at the left lung base with improved aeration since the examination 2 days ago. Emphysematous changes in the upper  lobes. No new pulmonary parenchymal abnormalities.  IMPRESSION: 1. Support apparatus satisfactory. 2. No pneumothorax with left chest tube in place. 3. Improved aeration in the left lung base, with mild patchy airspace opacities persisting. 4. No new abnormalities.   Electronically Signed   By: Evangeline Dakin M.D.   On: 02/17/2014 11:39        ASSESSMENT / PLAN: Acute hypoxic resp failure - Suspect aspiration pneumonitis with resp arrest 12/29 BL pneumothorces -resolved Airway plugging 2/2 Aspiration/ retained secretions/ poor cough mechanics VDRF recurrent  COPD with bronchospasm / noct "wheeze" and "choking" likely upper airway  mild pulm edema(better with diuresis)    02/17/14: Tolerating Trach collar all day since 02/16/14.. 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 Objectively none  P Monitor    Family - discussed with daughters & sons  at bedside 12/30 with pt involved, agreeable to trach/peg, guarded but optimistic prognosis given to return to previous functioning as noted on 12/28. Plan for LTAC. Daughter updated 02/16/14 and 02/17/14 and 02/18/14 . OK to move to SDU. PCCM will follo Monday , Thu     Dr. Brand Males, M.D., [facility].C.P Pulmonary and Critical Care Medicine Staff Physician [facility] Pager: [phone], If no answer or between  15:00h - 7:00h: call [phone]  02/18/2014 9:42 AM

## 2014-02-18 NOTE — Progress Notes (Signed)
STROKE TEAM PROGRESS NOTE   HISTORY Summer Bradley is a 69 y.o. female who got up around 4:30 AM 01/29/2014 to go to the bathroom and then sat down in her chair. She is not certain if her left arm was working at that time or not. She then fell out of her chair around 5 AM and that time noticed that her left arm wasn't working. She did not realize that anything was wrong until she fell out of her chair.  She was in her normal state of health ongoing to bed the evening prior. She was last known well at 8p on 01/28/2014.  In the ER, a CT was obtained which shows evidence of a previous stroke, and a CT perfusion scan was obtained which shows a significant mismatch demonstrating an area of penumbra, though there is an smaller area of likely infarct core as well.  Patient was not administered TPA secondary to being  outside of the tPA window with question of subdural hematoma on CT. Due to mismatch, she was taken to neuro intervention where she had complete revascularization of RT MCA M1 occlusion using 2 passes with the Solitaire and 10 mg of IA Integrelin. TICI 3 flow reesablished. Afterwards, she was admitted to the neuro ICU for further evaluation and treatment.   SUBJECTIVE (INTERVAL HISTORY):  Son and daughters are at bedside. Pt had both PEG and trach yesterday. Still on vent. Pt awake alert and follows commands. Resume eliquis and tube feeding.    OBJECTIVE Temp:  [97.5 F (36.4 C)-98.3 F (36.8 C)] 98.2 F (36.8 C) (01/03 0800) Pulse Rate:  [39-148] 88 (01/03 0808) Cardiac Rhythm:  [-] Atrial fibrillation (01/02 2000) Resp:  [18-30] 24 (01/03 0808) BP: (123-159)/(68-103) 137/68 mmHg (01/03 0808) SpO2:  [90 %-100 %] 97 % (01/03 0808) FiO2 (%):  [28 %] 28 % (01/03 0808)  No results for input(s): GLUCAP in the last 168 hours.  Recent Labs Lab 02/13/14 0326 02/14/14 0500 02/15/14 0500 02/16/14 0529 02/18/14 0510  NA 137 140 143 145 143  K 3.5 3.8 4.0 4.1 3.5  CL 100 101 104 104 103   CO2 27 32 33* 32 37*  GLUCOSE 125* 117* 106* 100* 128*  BUN 25* 28* 29* 26* 23  CREATININE 0.73 0.78 0.84 0.74 0.49*  CALCIUM 8.7 8.3* 8.7 8.6 8.6  MG  --   --   --   --  2.1  PHOS  --   --   --   --  2.2*   No results for input(s): AST, ALT, ALKPHOS, BILITOT, PROT, ALBUMIN in the last 168 hours.  Recent Labs Lab 02/12/14 0548 02/14/14 0500 02/15/14 0500 02/16/14 0529 02/18/14 0510  WBC 17.9* 18.5* 17.8* 18.2* 20.5*  NEUTROABS  --   --   --   --  17.5*  HGB 9.5* 9.3* 9.0* 9.0* 9.2*  HCT 33.2* 31.7* 31.5* 31.8* 32.8*  MCV 81.6 82.6 82.0 83.2 81.8  PLT 628* 559* 507* 480* 435*   No results for input(s): CKTOTAL, CKMB, CKMBINDEX, TROPONINI in the last 168 hours. No results for input(s): LABPROT, INR in the last 72 hours. No results for input(s): COLORURINE, LABSPEC, [place], GLUCOSEU, HGBUR, BILIRUBINUR, KETONESUR, PROTEINUR, UROBILINOGEN, NITRITE, LEUKOCYTESUR in the last 72 hours.  Invalid input(s): APPERANCEUR     Component Value Date/Time   CHOL 114 01/30/2014 0337   TRIG 102 01/30/2014 0337   HDL 24* 01/30/2014 0337   CHOLHDL 4.8 01/30/2014 0337   VLDL 20 01/30/2014 2297  [place] 70 01/30/2014 0337   Lab Results  Component Value Date   HGBA1C 6.4* 01/30/2014      Component Value Date/Time   LABOPIA NONE DETECTED 01/29/2014 0659   COCAINSCRNUR NONE DETECTED 01/29/2014 0659   LABBENZ NONE DETECTED 01/29/2014 0659   AMPHETMU NONE DETECTED 01/29/2014 0659   THCU NONE DETECTED 01/29/2014 0659   LABBARB NONE DETECTED 01/29/2014 0659    No results for input(s): ETH in the last 168 hours.   Ct Head Wo Contrast  02/01/2014  Stat CT scan shows no acute changes in the brain, but there appears to be a thrombus in the basilar artery that is new.  01/30/2014    1. Resolved right MCA territory hyperdensity present at 1056 hrs yesterday, most likely was post Neurointervention parenchymal contrast staining. 2. Mild cytotoxic edema suspected in that same right MCA  territory. Underlying chronic ischemic changes. 3. Stable parafalcine subdural hematoma. No new intracranial hemorrhage or acute mass effect. 4. No new intracranial abnormality.    01/29/2014    Acute infarct right MCA territory as noted on CT perfusion study. There is interval development of high density in the right temporoparietal cortex which may be due to contrast material or hemorrhage. Continued follow-up recommended.  Small interhemispheric subdural hematoma is unchanged.    01/29/2014   1. 4 mm thick posterior falx subdural hemorrhage. 2. Prominent density at the right M1/M2 junction. CTA is already planned. 3. Established infarcts in the anterior and posterior right MCA territory. No definitive acute infarct. 4. Left frontal scalp contusion.  No acute fracture.    Ct Cerebral Perfusion W/cm 01/29/2014    1. Occlusive thrombus at the right M1-M2 junction. Subjectively good pial/pial collateral circulation. 2. Perfusion findings suggest moderate acute infarct in the central right MCA territory with rim of penumbra, as above. 3. Remote infarcts at the anterior and posterior margins of the right MCA territory. 4. Thin posterior falx subdural hemorrhage.     Cerebral Angiogram  01/29/2014 S/P Bilateral common carotid arteriograms, followed by complete revascularization of RT MCA M1 occlusion usingX2 passes with the Solitaire FR 75mm x 34mm retrieval device And 10 mg of  superselective intracranial IA Integrelin. TICI 3 flow reesablished.   MRI brain  02/02/14 1. New, acute infarcts in the posterior circulation related to treated basilar thrombosis. Infarcts present in the bilateral cerebellum, upper right brainstem, mesial right temporal lobe, and minimally along the right occipital cortex. Normal flow voids within the intracranial vessels. 2. Stable appearance of recent right MCA territory infarct. 3. Stable trace falcine subdural hemorrhage.  01/31/14 1. Acute nonhemorrhagic infarct  involving the superior right temporal lobe, right insular ribbon, right frontal operculum, and posterior right frontal lobe. 2. More remote infarcts are again noted in the more anterior inferior right frontal lobe and the right parietal lobe. 3. Extensive white matter changes bilaterally likely reflect the sequela of chronic microvascular ischemia, advanced for age. 4. White matter changes extend into the brainstem. 5. Remote tender infarcts of the cerebellum bilaterally, worse on the right.  Dg Chest Port 1 View 02/08/2014    1. Right PICC line stable position. 2. Congestive heart failure with interstitial edema and small right pleural effusion. Mild improvement from prior exam. 02/10/2014 Improved bibasilar atelectasis versus airspace disease. 02/13/2014 - No pneumothorax status post bilateral chest tube placement. Stable support apparatus  2D Echocardiogram  EF 50-55% with no source of embolus.   A1C 6.4 and LDL 70   Physical Exam General: The patient is  awake, following commands. The patient is obese. The patient has ecchymosis and abrasions around the left eye.  Respiratory: Diminished breath sounds at the bilateral bases with rhonchi and wheezing Cardiovascular: irregular rate and rhythm, no murmurs noted Abdomen: Soft Skin: No significant peripheral edema is noted. Neurologic Exam Mental status: The patient is cooperative, follows commands. Orientated to person, place, time, situation. She is distressed by the mucous plugs and her respiratory condition. Cranial nerves: Pupils are equal, reactive to light. Difficulty with upgaze. Blnk to threat in all quadrants, VFF. Mild left facial asymmetry.  Motor: mild weakness left arm, left arm drift. Bilateral Hip flexion mild weakness. Otherwise intact.  Sensory examination: The patient has decreased sensation on the left arm Coordination: No obvious ataxia seen on the extremities. Difficult to test. Intact FTN, difficult to test HTS.   Gait and station: Gait was not tested. Reflexes: Deep tendon reflexes are brisk throughout.   ASSESSMENT/PLAN Ms. Summer Bradley is a 69 y.o. female with history of hypertension, stroke, afib and COPD presenting with Left-sided weakness. She did not receive IV t-PA due to delay in arrival, ? SDH. But underwent mechanical thrombectomy of right MCA with complete vascularization of right MCA M1 occlusion with IA Integrilin. 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 Dr. Estanislado Pandy was able to extract the clot, and the patient has sustained a right pontine and midbrain stroke, small bilateral cerebellar strokes, and some extension of the infarct in the mesial temporal area on the right.   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 Infarcts embolic secondary to Afib with subtherapeutic INR on coumadin.  Resultant left arm weakness and left facial droop  2D Echo  EF 50-55% no source of embolus  HgbA1c 6.4  eliquis po for VTE prophylaxis  aspirin 81 mg orally every day and warfarin prior to admission, changed to eliquis for stroke prevention. Resume eliquis.   Neurologically stable   PT/OT on board and recommend LTAC  Deposition - transfer to step down today.    Acute Respiratory Failure / COPD exacerbation   Intubated for neuro intervention-extubated 02/06/14  Tolerated well 24 hours post extubation.  Once to the floor 12/23, overnight developed more respiratory distress and concerning for aspiration  12/24 Transferred to step down unit for further monitoring  On dysphagia 1 diet with honey thick liquids. Off IVF  Continue nebulization and decrease steroids to Qday.  CCM on board, appreciate their  help  Developed the respiratory distress again on 02/13/2014, intubated and transferred to neuro ICU  Trach done. Currently off the vent for 48 hours doing well. Ventilator out of the room  LTAC placement  con't unasyn for aspiration PNA  Atrial Fibrillation / A flutter with AV block  Home meds:  Warfarin  INR subtherapeutic on admission, INR 1.18   started Cardizem and s/p d/c amiodarone.   Rate controlled.  Resume eliquis for stroke prevention  Anxiety - s/p d/c fentanyl - started baseline long benzo Klonopin .5 q12. If period of agitation specifically at night. Would start seroquel 25 nightly.    Hypertension  Continue to monitor  BP stable 120-140  On bisoprolol  Hyperlipidemia  Home meds:  Mevacor  LDL 70, goal < 70  On pravastatin  Continue statin on d/c  Dysphagia  Due to concerns of aspiration, she  was put on NPO except meds.  Now status changed to dysphagia 1 with honey thick liquids  Developed a respiratory distress again on 02/13/2014, intubated and transferred to neuro ICU. Concerning for aspiration again.  PEG done. TF restarted Trush  Fluconazole for 14 days  Started on 02/10/2014  Other Stroke Risk Factors  Advanced age  Obesity, Body mass index is 33.5 kg/(m^2).   Hx stroke/TIA  Other Active Problems  Depression, on chronic SSRI  Anemia  Leukocytosis - on solumedrol - afebrile  Hospital day # 20   This patient is critically ill due to respiratory failure, embolic stroke and at significant risk of neurological and the respiratory worsening, death form respiratory failure, return to stroke. This patient's care requires constant monitoring of vital signs, hemodynamics, respiratory and cardiac monitoring, review of multiple databases, neurological assessment, discussion with family, other specialists and medical decision making of high complexity. I spent 43 minutes of neurocritical care time in the care of this  patient.  Leotis Pain  Stroke Neurology 02/18/2014 9:40 AM   To contact Stroke Continuity provider, please refer to [email or web address] After hours, contact General Neurology

## 2014-02-18 NOTE — Progress Notes (Signed)
[place] for Apixaban Indication: atrial fibrillation  Allergies  Allergen Reactions  . Lisinopril     Unknown reaction    Patient Measurements: Height: 5\' 2"  (157.5 cm) Weight: 183 lb 3.2 oz (83.1 kg) IBW/kg (Calculated) : 50.1  Vital Signs: Temp: 97.6 F (36.4 C) (01/03 1200) Temp Source: Axillary (01/03 1200) BP: 122/79 mmHg (01/03 1310) Pulse Rate: 70 (01/03 1310)  Labs:  Recent Labs  02/16/14 0529 02/18/14 0510  HGB 9.0* 9.2*  HCT 31.8* 32.8*  PLT 480* 435*  CREATININE 0.74 0.49*    Estimated Creatinine Clearance: 66.3 mL/min (by C-G formula based on Cr of 0.49).   Medical History: Past Medical History  Diagnosis Date  . Hypertension   . Stroke   . A-fib   . COPD (chronic obstructive pulmonary disease)     Medications:  Scheduled:  . ampicillin-sulbactam (UNASYN) IV  3 g Intravenous Q6H  . antiseptic oral rinse  7 mL Mouth Rinse QID  . apixaban  5 mg Oral BID  . bisoprolol  2.5 mg Oral BID  . chlorhexidine  15 mL Mouth Rinse BID  . citalopram  20 mg Per Tube QHS  . clonazePAM  0.5 mg Oral BID  . diltiazem  60 mg Oral 4 times per day  . feeding supplement (PRO-STAT SUGAR FREE 64)  30 mL Per Tube BID  . fluconazole  100 mg Oral QPC supper  . levalbuterol  0.63 mg Nebulization QID  . levothyroxine  150 mcg Per Tube QAC breakfast  . methylPREDNISolone (SOLU-MEDROL) injection  20 mg Intravenous Daily  . pantoprazole sodium  40 mg Per Tube BID  . potassium phosphate IVPB (mmol)  10 mmol Intravenous Once  . pravastatin  20 mg Per Tube q1800  . sodium chloride  10-40 mL Intracatheter Q12H    Assessment: 69 y.o. female on apixaban for Afib. Apixaban has been held  12/30 p.m. for trach/peg, restarted apixaban 02/16/14.  Hgb low but stable, PLT elevated. No bleeding noted,   Goal of Therapy:  Prevention of stroke Monitor platelets by anticoagulation protocol: Yes   Plan:  1. Apixaban 5mg  po BID 2. Will f/u  renal function, s/s bleeding.  Thanks for allowing pharmacy to be a part of this patient's care.  Excell Seltzer, PharmD Clinical Pharmacist, [phone] 02/18/2014,1:46 PM

## 2014-02-19 ENCOUNTER — Inpatient Hospital Stay (HOSPITAL_COMMUNITY): Payer: Medicare HMO

## 2014-02-19 LAB — CBC WITH DIFFERENTIAL/PLATELET
Basophils Absolute: 0 10*3/uL (ref 0.0–0.1)
Basophils Relative: 0 % (ref 0–1)
Eosinophils Absolute: 0 10*3/uL (ref 0.0–0.7)
Eosinophils Relative: 0 % (ref 0–5)
HCT: 31.6 % — ABNORMAL LOW (ref 36.0–46.0)
Hemoglobin: 9.5 g/dL — ABNORMAL LOW (ref 12.0–15.0)
Lymphocytes Relative: 6 % — ABNORMAL LOW (ref 12–46)
Lymphs Abs: 1.4 10*3/uL (ref 0.7–4.0)
MCH: 24.3 pg — ABNORMAL LOW (ref 26.0–34.0)
MCHC: 30.1 g/dL (ref 30.0–36.0)
MCV: 80.8 fL (ref 78.0–100.0)
Monocytes Absolute: 1.2 10*3/uL — ABNORMAL HIGH (ref 0.1–1.0)
Monocytes Relative: 6 % (ref 3–12)
Neutro Abs: 18.8 10*3/uL — ABNORMAL HIGH (ref 1.7–7.7)
Neutrophils Relative %: 88 % — ABNORMAL HIGH (ref 43–77)
Platelets: 341 10*3/uL (ref 150–400)
RBC: 3.91 MIL/uL (ref 3.87–5.11)
RDW: 20.2 % — ABNORMAL HIGH (ref 11.5–15.5)
WBC: 21.3 10*3/uL — ABNORMAL HIGH (ref 4.0–10.5)

## 2014-02-19 LAB — MAGNESIUM: Magnesium: 2 mg/dL (ref 1.5–2.5)

## 2014-02-19 LAB — URINALYSIS W MICROSCOPIC (NOT AT ARMC)
Bilirubin Urine: NEGATIVE
Glucose, UA: NEGATIVE mg/dL
Hgb urine dipstick: NEGATIVE
Ketones, ur: NEGATIVE mg/dL
Leukocytes, UA: NEGATIVE
Nitrite: NEGATIVE
Protein, ur: NEGATIVE mg/dL
Specific Gravity, Urine: 1.016 (ref 1.005–1.030)
Urobilinogen, UA: 2 mg/dL — ABNORMAL HIGH (ref 0.0–1.0)
pH: 8 (ref 5.0–8.0)

## 2014-02-19 LAB — BASIC METABOLIC PANEL
Anion gap: 7 (ref 5–15)
BUN: 19 mg/dL (ref 6–23)
CO2: 31 mmol/L (ref 19–32)
Calcium: 8.6 mg/dL (ref 8.4–10.5)
Chloride: 103 mEq/L (ref 96–112)
Creatinine, Ser: 0.45 mg/dL — ABNORMAL LOW (ref 0.50–1.10)
GFR calc Af Amer: >90 mL/min (ref >90)
GFR calc non Af Amer: >90 mL/min (ref >90)
Glucose, Bld: 144 mg/dL — ABNORMAL HIGH (ref 70–99)
Potassium: 3.6 mmol/L (ref 3.5–5.1)
Sodium: 141 mmol/L (ref 135–145)

## 2014-02-19 LAB — PHOSPHORUS: Phosphorus: 2.8 mg/dL (ref 2.3–4.6)

## 2014-02-19 MED ORDER — ACETYLCYSTEINE 10 % IN SOLN
4.0000 mL | Freq: Four times a day (QID) | RESPIRATORY_TRACT | Status: DC
  Filled 2014-02-19 (×5): qty 4

## 2014-02-19 MED ORDER — FUROSEMIDE 10 MG/ML IJ SOLN
40.0000 mg | Freq: Two times a day (BID) | INTRAMUSCULAR | Status: DC
  Administered 2014-02-19 – 2014-03-05 (×28): 40 mg via INTRAVENOUS
  Filled 2014-02-19 (×33): qty 4

## 2014-02-19 MED ORDER — PREDNISONE 20 MG PO TABS
20.0000 mg | ORAL_TABLET | Freq: Every day | ORAL | Status: DC
  Administered 2014-02-20 – 2014-02-21 (×2): 20 mg via ORAL
  Filled 2014-02-19 (×4): qty 1

## 2014-02-19 MED ORDER — LEVALBUTEROL HCL 0.63 MG/3ML IN NEBU
0.6300 mg | INHALATION_SOLUTION | Freq: Four times a day (QID) | RESPIRATORY_TRACT | Status: DC
  Administered 2014-02-19 – 2014-02-25 (×26): 0.63 mg via RESPIRATORY_TRACT
  Filled 2014-02-19 (×35): qty 3

## 2014-02-19 MED ORDER — ACETYLCYSTEINE 20 % IN SOLN
2.0000 mL | Freq: Four times a day (QID) | RESPIRATORY_TRACT | Status: DC
  Administered 2014-02-19 – 2014-02-21 (×9): 2 mL via RESPIRATORY_TRACT
  Administered 2014-02-22: 4 mL via RESPIRATORY_TRACT
  Administered 2014-02-22: 2 mL via RESPIRATORY_TRACT
  Filled 2014-02-19 (×17): qty 4

## 2014-02-19 MED ORDER — POTASSIUM CHLORIDE 20 MEQ/15ML (10%) PO SOLN
40.0000 meq | Freq: Two times a day (BID) | ORAL | Status: DC
  Administered 2014-02-19 – 2014-02-20 (×3): 40 meq
  Filled 2014-02-19 (×6): qty 30

## 2014-02-19 NOTE — Progress Notes (Signed)
Sputum specimen obtained without any complications.  Sent down to main lab.  RT will continue to monitor.

## 2014-02-19 NOTE — Progress Notes (Signed)
STROKE TEAM PROGRESS NOTE   HISTORY Summer Bradley is a 68 y.o. female who got up around 4:30 AM 01/29/2014 to go to the bathroom and then sat down in her chair. She is not certain if her left arm was working at that time or not. She then fell out of her chair around 5 AM and that time noticed that her left arm wasn't working. She did not realize that anything was wrong until she fell out of her chair.  She was in her normal state of health ongoing to bed the evening prior. She was last known well at 8p on 01/28/2014.  In the ER, a CT was obtained which shows evidence of a previous stroke, and a CT perfusion scan was obtained which shows a significant mismatch demonstrating an area of penumbra, though there is an smaller area of likely infarct core as well.  Patient was not administered TPA secondary to being  outside of the tPA window with question of subdural hematoma on CT. Due to mismatch, she was taken to neuro intervention where she had complete revascularization of RT MCA M1 occlusion using 2 passes with the Solitaire and 10 mg of IA Integrelin. TICI 3 flow reesablished. Afterwards, she was admitted to the neuro ICU for further evaluation and treatment.   SUBJECTIVE (INTERVAL HISTORY):  Son and daughter are at bedside. Pt is on PEG and trach. Extremely dry mouth. After rinsing lips and mouth, she is more comfortable. Follows all commands, and breathing comfortably. Off vent since Saturday. On TF.   OBJECTIVE Temp:  [97.4 F (36.3 C)-98.5 F (36.9 C)] 98.1 F (36.7 C) (01/04 1615) Pulse Rate:  [25-100] 71 (01/04 1600) Cardiac Rhythm:  [-] Atrial fibrillation (01/04 1600) Resp:  [18-32] 32 (01/04 1600) BP: (107-146)/(68-96) 139/96 mmHg (01/04 1600) SpO2:  [94 %-98 %] 97 % (01/04 1600) FiO2 (%):  [28 %] 28 % (01/04 1600)  No results for input(s): GLUCAP in the last 168 hours.  Recent Labs Lab 02/14/14 0500 02/15/14 0500 02/16/14 0529 02/18/14 0510 02/19/14 0500  NA 140 143 145 143  141  K 3.8 4.0 4.1 3.5 3.6  CL 101 104 104 103 103  CO2 32 33* 32 37* 31  GLUCOSE 117* 106* 100* 128* 144*  BUN 28* 29* 26* 23 19  CREATININE 0.78 0.84 0.74 0.49* 0.45*  CALCIUM 8.3* 8.7 8.6 8.6 8.6  MG  --   --   --  2.1 2.0  PHOS  --   --   --  2.2* 2.8   No results for input(s): AST, ALT, ALKPHOS, BILITOT, PROT, ALBUMIN in the last 168 hours.  Recent Labs Lab 02/14/14 0500 02/15/14 0500 02/16/14 0529 02/18/14 0510 02/19/14 0500  WBC 18.5* 17.8* 18.2* 20.5* 21.3*  NEUTROABS  --   --   --  17.5* 18.8*  HGB 9.3* 9.0* 9.0* 9.2* 9.5*  HCT 31.7* 31.5* 31.8* 32.8* 31.6*  MCV 82.6 82.0 83.2 81.8 80.8  PLT 559* 507* 480* 435* 341   No results for input(s): CKTOTAL, CKMB, CKMBINDEX, TROPONINI in the last 168 hours. No results for input(s): LABPROT, INR in the last 72 hours. No results for input(s): COLORURINE, LABSPEC, [place], GLUCOSEU, HGBUR, BILIRUBINUR, KETONESUR, PROTEINUR, UROBILINOGEN, NITRITE, LEUKOCYTESUR in the last 72 hours.  Invalid input(s): APPERANCEUR     Component Value Date/Time   CHOL 114 01/30/2014 0337   TRIG 102 01/30/2014 0337   HDL 24* 01/30/2014 0337   CHOLHDL 4.8 01/30/2014 0337   VLDL 20 01/30/2014 0865  [place] 70 01/30/2014 0337   Lab Results  Component Value Date   HGBA1C 6.4* 01/30/2014      Component Value Date/Time   LABOPIA NONE DETECTED 01/29/2014 0659   COCAINSCRNUR NONE DETECTED 01/29/2014 0659   LABBENZ NONE DETECTED 01/29/2014 0659   AMPHETMU NONE DETECTED 01/29/2014 0659   THCU NONE DETECTED 01/29/2014 0659   LABBARB NONE DETECTED 01/29/2014 0659    No results for input(s): ETH in the last 168 hours.   Ct Head Wo Contrast  02/01/2014  Stat CT scan shows no acute changes in the brain, but there appears to be a thrombus in the basilar artery that is new.  01/30/2014    1. Resolved right MCA territory hyperdensity present at 1056 hrs yesterday, most likely was post Neurointervention parenchymal contrast staining. 2. Mild  cytotoxic edema suspected in that same right MCA territory. Underlying chronic ischemic changes. 3. Stable parafalcine subdural hematoma. No new intracranial hemorrhage or acute mass effect. 4. No new intracranial abnormality.    01/29/2014    Acute infarct right MCA territory as noted on CT perfusion study. There is interval development of high density in the right temporoparietal cortex which may be due to contrast material or hemorrhage. Continued follow-up recommended.  Small interhemispheric subdural hematoma is unchanged.    01/29/2014   1. 4 mm thick posterior falx subdural hemorrhage. 2. Prominent density at the right M1/M2 junction. CTA is already planned. 3. Established infarcts in the anterior and posterior right MCA territory. No definitive acute infarct. 4. Left frontal scalp contusion.  No acute fracture.    Ct Cerebral Perfusion W/cm 01/29/2014    1. Occlusive thrombus at the right M1-M2 junction. Subjectively good pial/pial collateral circulation. 2. Perfusion findings suggest moderate acute infarct in the central right MCA territory with rim of penumbra, as above. 3. Remote infarcts at the anterior and posterior margins of the right MCA territory. 4. Thin posterior falx subdural hemorrhage.     Cerebral Angiogram  01/29/2014 S/P Bilateral common carotid arteriograms, followed by complete revascularization of RT MCA M1 occlusion usingX2 passes with the Solitaire FR 7mm x 46mm retrieval device And 10 mg of  superselective intracranial IA Integrelin. TICI 3 flow reesablished.   MRI brain  02/02/14 1. New, acute infarcts in the posterior circulation related to treated basilar thrombosis. Infarcts present in the bilateral cerebellum, upper right brainstem, mesial right temporal lobe, and minimally along the right occipital cortex. Normal flow voids within the intracranial vessels. 2. Stable appearance of recent right MCA territory infarct. 3. Stable trace falcine subdural  hemorrhage.  01/31/14 1. Acute nonhemorrhagic infarct involving the superior right temporal lobe, right insular ribbon, right frontal operculum, and posterior right frontal lobe. 2. More remote infarcts are again noted in the more anterior inferior right frontal lobe and the right parietal lobe. 3. Extensive white matter changes bilaterally likely reflect the sequela of chronic microvascular ischemia, advanced for age. 4. White matter changes extend into the brainstem. 5. Remote tender infarcts of the cerebellum bilaterally, worse on the right.  Dg Chest Port 1 View 02/08/2014    1. Right PICC line stable position. 2. Congestive heart failure with interstitial edema and small right pleural effusion. Mild improvement from prior exam. 02/10/2014 Improved bibasilar atelectasis versus airspace disease. 02/13/2014 - No pneumothorax status post bilateral chest tube placement. Stable support apparatus  2D Echocardiogram  EF 50-55% with no source of embolus.   A1C 6.4 and LDL 70   Physical Exam General: The patient is  awake, alert and following simple commands. The patient is obese.   Respiratory: decrease breath sounds at the bilateral bases with rhonchi and wheezing Cardiovascular: irregular rate and rhythm, no murmurs noted Abdomen: Soft Skin: No significant peripheral edema is noted. Neurologic Exam Mental status: The patient is cooperative, follows simple commands.  Cranial nerves: Pupils are equal, reactive to light. Difficulty with upgaze. Blnk to threat in all quadrants, VFF. Mild left facial asymmetry.  Motor: mild weakness left arm, left arm drift. Bilateral Hip flexion mild weakness. Otherwise intact.  Sensory examination: The patient has decreased sensation on the left arm Coordination: No obvious ataxia seen on the extremities. Difficult to test. Intact FTN, difficult to test HTS.  Gait and station: Gait was not tested. Reflexes: Deep tendon reflexes are brisk  throughout.  ASSESSMENT/PLAN Ms. Summer Bradley is a 69 y.o. female with history of hypertension, stroke, afib and COPD presenting with Left-sided weakness. She did not receive IV t-PA due to delay in arrival, ? SDH. But underwent mechanical thrombectomy of right MCA with complete vascularization of right MCA M1 occlusion with IA Integrilin. 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 Dr. Estanislado Pandy was able to extract the clot, and the patient has sustained a right pontine and midbrain stroke, small bilateral cerebellar strokes, and some extension of the infarct in the mesial temporal area on the right.   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 Infarcts embolic secondary to Afib with subtherapeutic INR on coumadin.  Resultant left arm weakness and left facial droop  2D Echo  EF 50-55% no source of embolus  HgbA1c 6.4  eliquis po for VTE prophylaxis  aspirin 81 mg orally every day and warfarin prior to admission, changed to eliquis for stroke prevention. Resume eliquis.   Neurologically stable   PT/OT on board and recommend LTAC  Deposition - transfer to step down today.    Acute Respiratory Failure / COPD exacerbation   Intubated for neuro intervention-extubated 02/06/14  Tolerated well 24 hours post extubation.  Once to the floor 12/23, overnight developed more respiratory distress and concerning for aspiration  12/24 Transferred to step down unit for further monitoring  On dysphagia 1 diet with honey thick liquids. Off IVF  Continue nebulization and decrease steroids to Qday.  CCM on board, appreciate their help  Developed the respiratory distress again on 02/13/2014, intubated and transferred to neuro  ICU  Trach done. Off vent now.  LTAC placement  unasyn discontinued  Atrial Fibrillation / A flutter with AV block  Home meds:  Warfarin  INR subtherapeutic on admission, INR 1.18   started Cardizem and s/p d/c amiodarone.   Rate controlled.  Resume eliquis for stroke prevention   Hypertension  Continue to monitor  BP stable 120-140  On bisoprolol  Hyperlipidemia  Home meds:  Mevacor  LDL 70, goal < 70  On pravastatin  Continue statin on d/c  Dysphagia  Due to concerns of aspiration, she was put on NPO except meds.  Now status changed to dysphagia 1 with honey thick liquids  Developed a respiratory distress again on 02/13/2014, intubated and transferred to neuro ICU. Concerning for aspiration again.   PEG done. TF restarted  Speech following  Trush  Fluconazole for 14 days  Started on 02/10/2014  Other Stroke Risk Factors  Advanced age  Obesity, Body mass index is 33.5 kg/(m^2).   Hx stroke/TIA  Other Active Problems  Depression, on chronic SSRI  Anemia  Leukocytosis - on solumedrol - afebrile  Hospital day # 21   Rosalin Hawking, MD PhD Stroke Neurology 02/19/2014 6:39 PM    To contact Stroke Continuity provider, please refer to [email or web address] After hours, contact General Neurology

## 2014-02-19 NOTE — Progress Notes (Signed)
RT Note:  New admit from 3MW.  Placed patient on 28% ATC.  Trach secured and in placed.  Patient stable.  Emergency equipment at bedside.

## 2014-02-19 NOTE — Progress Notes (Signed)
PULMONARY / CRITICAL CARE MEDICINE   Name: Summer Bradley MRN: [id number] DOB: 04-30-1945    ADMISSION DATE:  01/29/2014 CONSULTATION DATE:  01/29/2014  REFERRING MD : Neuro  CHIEF COMPLAINT: Left sided weakness  BRIEF 33 yowf  with COPD quit smoking 12 y pta , PAF admitted with acute CVA to Stroke Service after clot retrieval from R MCA   Tubes/Lines: OETT 12/14>>12/15, 12/17 >>12/22, 12/29 >>12/31 Trach 12/31 >> RUE PICC 12/18 >>    STUDIES/SIGNIFICANT EVENTS: 12/14 CTA head: Occlusive thrombus at the right M1-M2 junction. Subjectively good pial/pial collateral circulation. Perfusion findings suggest moderate acute infarct in the central right MCA territory with rim of penumbra, as above. Remote infarcts at the anterior and posterior margins of the right MCA territory. Thin posterior falx subdural hemorrhage  12/14 admitted to stroke  service after Neuro IR clot retrieval from R MCA. Intubated for procedure. PCCM consultation 12/14 CT head: Acute infarct right MCA territory as noted on CT perfusion study. There is interval development of high density in the right  temporoparietal cortex which may be due to contrast material or hemorrhage.  12/14 Echocardiogram: Lvef 50-55% 12/15 Ct head: Mild cytotoxic edema suspected in that same right MCA territory. Underlying chronic ischemic changes. Stable parafalcine subdural hematoma. No new intracranial hemorrhage or acute mass effect. No new intracranial abnormality 12/15 Extubated 12/16 Transferred out of ICU. PCCM signed off 12/16 MRI brain: Acute nonhemorrhagic infarct involving the superior right temporal lobe, right insular ribbon, right frontal operculum, and posterior right frontal lobe. More remote infarcts are again noted in the more anterior inferior right frontal lobe and the right parietal lobe. Extensive white matter changes bilaterally likely reflect the sequela of chronic microvascular ischemia, advanced for age. White matter  changes extend into the brainstem. Remote tender infarcts of the cerebellum bilaterally, worse on the right. 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 Early edema suspected in the thalami right greater than left  12/17 Repeat cerebral angiogram by Neuro IR with clot retrieval. Remained intubated after procedure 12/17 CT head: Large RIGHT MCA territory infarct extending to RIGHT basal ganglia. Underlying atrophy and small vessel chronic ischemic changes. Stable parafalcine subdural hematoma. No new intracranial abnormalities  12/18 failed SBT due to tachypnea and low Vt. Wheezing noted 12/18 MRI brain: New, acute infarcts in the posterior circulation related to treated basilar thrombosis. Infarcts present in the bilateral cerebellum, upper right brainstem, mesial right temporal lobe, and minimally along the right occipital cortex 02/04/14- sputum with moraxella 12/22  extubation.    12/22 failed swallow eval. 12/25 ST recs DI diet/ honey thickened  12/29 resp arrest >>ETT, BL pnthoraces - chest tubes 12/30 rt chest tube out 02/15/14: s/p TRACH -  FEINSTEIN    02/16/14:  Doing Trach collar. Follows commands. BAck on apixaban. Daughter at bedside 02/17/14 - contnues TC without problems. Daughter Jackelyn Poling at bedside - feels mom has has new onset "delirium" since trach. But RN does not report that. Daughter says patient has a blank stare. Left chest tube out 02/18/14 - daughter Jackelyn Poling feels confusion improved. Tolerating TC fine since 02/16/14. Left chest tube out yesterday. Foley out. Neuro plans Tx to SDU   SUBJECTIVE/OVERNIGHT/INTERVAL HX 02/19/14 - speech plans PMV. PTX resolved on CXR bu t CXR wet with BNP 900s. RN reports mild baseline confusion   VITAL SIGNS: Temp:  [97.4 F (36.3 C)-98.4 F (36.9 C)] 98.4 F (36.9 C) (01/04 0754) Pulse Rate:  [25-148] 57 (01/04  0800) Resp:  [20-31] 27 (01/04 0800) BP: (107-153)/(61-92) 107/68 mmHg (01/04  0826) SpO2:  [92 %-98 %] 97 % (01/04 0800) FiO2 (%):  [28 %] 28 % (01/04 0800)   HEMODYNAMICS: VENTILATOR SETTINGS: Vent Mode:  [-]  FiO2 (%):  [28 %] 28 % INTAKE / OUTPUT:  Intake/Output Summary (Last 24 hours) at 02/19/14 0951 Last data filed at 02/19/14 0800  Gross per 24 hour  Intake 1774.66 ml  Output      0 ml  Net 1774.66 ml    PHYSICAL EXAMINATION: General:  Chronically ill appearing , awake, denies pain  HEENT:  Mucosa somewhat dry. TRAcH SITE LOOKS CLEAN Cardiovascular:  IRIR,   Lungs: Diminished breath sounds , no air leak Abdomen:  Obese, large midline scar Musculoskeletal:  intact Skin:  Warm , no lower ext edema. BACK - no bedsore examined 1./4/16 Neuro: Awake, alert, calm. Movesl all 4s. CAM-ICU negative for delirium   LABS: PULMONARY  Recent Labs Lab 02/13/14 1336  PHART 7.297*  PCO2ART 56.0*  PO2ART 81.0  HCO3 27.5*  TCO2 29  O2SAT 94.0    CBC  Recent Labs Lab 02/16/14 0529 02/18/14 0510 02/19/14 0500  HGB 9.0* 9.2* 9.5*  HCT 31.8* 32.8* 31.6*  WBC 18.2* 20.5* 21.3*  PLT 480* 435* 341    COAGULATION  Recent Labs Lab 02/14/14 1300  INR 1.78*    CARDIAC  No results for input(s): TROPONINI in the last 168 hours. No results for input(s): PROBNP in the last 168 hours.  BNP 1/3!6 =- 987 CHEMISTRY  Recent Labs Lab 02/14/14 0500 02/15/14 0500 02/16/14 0529 02/18/14 0510 02/19/14 0500  NA 140 143 145 143 141  K 3.8 4.0 4.1 3.5 3.6  CL 101 104 104 103 103  CO2 32 33* 32 37* 31  GLUCOSE 117* 106* 100* 128* 144*  BUN 28* 29* 26* 23 19  CREATININE 0.78 0.84 0.74 0.49* 0.45*  CALCIUM 8.3* 8.7 8.6 8.6 8.6  MG  --   --   --  2.1 2.0  PHOS  --   --   --  2.2* 2.8   Estimated Creatinine Clearance: 66.3 mL/min (by C-G formula based on Cr of 0.45).   LIVER  Recent Labs Lab 02/14/14 1300  INR 1.78*     INFECTIOUS  Recent Labs Lab 02/18/14 0949  PROCALCITON <0.10     ENDOCRINE CBG (last 3)  No results for  input(s): GLUCAP in the last 72 hours.       IMAGING x48h Dg Chest Port 1 View  02/19/2014   CLINICAL DATA:  Respiratory failure  EXAM: PORTABLE CHEST - 1 VIEW  COMPARISON:  02/17/2014  FINDINGS: Tubular device is stable. Hazy airspace disease in the left mid and lower lung zones as well as the right base has not significantly changed. No pneumothorax. Pneumothorax noted at the left base has resolved.  IMPRESSION: Resolved left basilar pneumothorax. Bilateral hazy airspace disease not significantly changed   Electronically Signed   By: Maryclare Bean M.D.   On: 02/19/2014 07:57   Dg Chest Port 1 View  02/17/2014   CLINICAL DATA:  Initial encounter for chest tube removal  EXAM: PORTABLE CHEST - 1 VIEW  COMPARISON:  Earlier the same day  FINDINGS: 1816 hrs. Left chest tube is been removed in the interval. Lucency at the left base may be related to a small residual anterior left-sided pneumothorax. Right PICC line tip overlies the proximal to mid SVC. Tracheostomy tube remains in place. Sir  Cardiomegaly  IMPRESSION: Possible small anterior left pneumothorax status post chest tube removal.   Electronically Signed   By: Misty Stanley M.D.   On: 02/17/2014 18:34   Dg Chest Port 1 View  02/17/2014   CLINICAL DATA:  Evaluate chest tube placement. COPD. Stroke. Hypertension.  EXAM: PORTABLE CHEST - 1 VIEW  COMPARISON:  Earlier today at 1106 hr.  FINDINGS: 1644 hr. Left-sided chest tube is unchanged in position. Tracheostomy appropriately positioned. Right-sided PICC line unchanged with tip at low SVC.  Numerous leads and wires project over the chest. Cardiomegaly accentuated by AP portable technique. No pleural effusion or pneumothorax. Improved bibasilar aeration, with left greater than right airspace disease remaining.  IMPRESSION: Left chest tube remaining in place, without pneumothorax.  Improving bibasilar aeration.   Electronically Signed   By: Abigail Miyamoto M.D.   On: 02/17/2014 16:59   Dg Chest Port 1  View  02/17/2014   CLINICAL DATA:  Followup left basilar atelectasis versus pneumonia. Left chest tube in place. Chronic ventilator dependent respiratory failure.  EXAM: PORTABLE CHEST - 1 VIEW  COMPARISON:  02/15/2014 dating back to 02/08/2014.  FINDINGS: Tracheostomy tube tip in satisfactory position approximately 5 cm above the carina. Left chest tube in place with no pneumothorax. Right arm PICC tip projects over the lower SVC. Minimal subcutaneous emphysema in the left chest wall, significantly improved. Cardiac silhouette enlarged but stable. Pulmonary vascularity normal. Patchy airspace opacity at the left lung base with improved aeration since the examination 2 days ago. Emphysematous changes in the upper lobes. No new pulmonary parenchymal abnormalities.  IMPRESSION: 1. Support apparatus satisfactory. 2. No pneumothorax with left chest tube in place. 3. Improved aeration in the left lung base, with mild patchy airspace opacities persisting. 4. No new abnormalities.   Electronically Signed   By: Evangeline Dakin M.D.   On: 02/17/2014 11:39        ASSESSMENT / PLAN: Baseline    - COPD NOS  Current  Acute on chronic hypoxic resp failure - REcurrent VDRF   - Suspect aspiration pneumonitis with resp arrest 12/29 and poor cough mechanics   - Moraxella pneumonia 02/04/14   - BL pneumothorces -resolved on cxcxr 02/19/14         - 02/19/14: Tolerating Trach collar all day since 02/16/14.. S/p left chest tube out 02/17/14 with no residual PTx 02/19/14  - CXR with some mild pulmonary edema and infitlrates - BNP 900s   PLAN - Titrate O2 for sats 90-94% - Humidified O2 - Cont scheduled and PRN levalbuterol nebs -  Monitor off vent; low threshold to restart - speech consult for PMV - pending 02/19/14 - start lasix with kcl cover (check bnp 02/22/14)  - chane solumedrol (since 02/14/14)  to prednisone and taper - total 12 day course (? Being given for upper v lower airway, unclear)   ID #HCAP    Sputum + moraxella 12/20  -recurrent aspiration   - IV Unasyn for aspiration12/24 > >>12/27 , 12/29 >> 02/19/14 (PCT < 0.1) #ANtifungal for THRUSH  - on 14day diflucan starting 02/11/14 per neuro      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 ICU associated discomfort - Stroke mgmt per Stroke team  -restrarted apixaban 02/16/14   Dysphagia -  S/p PEG P Tube feeds via PEG  RENAL A : Mild Low phos 02/18/14 - repleted P monitor    NEURO A: daughter reports confusion 01/17/15 and improved 02/18/14 . Objectively none for MD on 14/16  as well but RN also repiorts mild intermittent confusion. Stable  P Monitor    Family - discussed with daughters & sons  at bedside 12/30 with pt involved, agreeable to trach/peg, guarded but optimistic prognosis given to return to previous functioning as noted on 12/28. Plan for LTAC. Daughter updated 02/16/14 and 02/17/14 and 02/18/14 .  Son updated 02/19/14 at bedside    PCCM will follo Monday , Thu. Commenced lasix 02/19/14. Recheck bnp 02/22/14     Dr. Brand Males, M.D., [facility].C.P Pulmonary and Critical Care Medicine Staff Physician [facility] Pager: [phone], If no answer or between  15:00h - 7:00h: call [phone]  02/19/2014 9:51 AM

## 2014-02-19 NOTE — Progress Notes (Signed)
Pt transferred to room 2C06. All questions and concerns addressed. All belongings sent with pt. Daughter, Lanier Clam, called and notified of pt's new room and unit number at [id number].

## 2014-02-19 NOTE — Plan of Care (Signed)
Problem: Acute Treatment Outcomes Goal: Neuro exam at baseline or improved Outcome: Not Progressing Pt not able to communicate that she is oriented beyond herself most of the time. Pt is following commands the majority of the time.   Problem: Progression Outcomes Goal: Bowel & Bladder Continence Outcome: Not Progressing Pt is still incontinent of bowels and bladder.

## 2014-02-19 NOTE — Progress Notes (Signed)
Occupational Therapy Treatment Patient Details Name: Summer Bradley MRN: [id number] DOB: 1945/10/27 Today's Date: 02/19/2014    History of present illness pt presents post R MCA clot retrieval with revascularization.  pt intubated 12/14-15, 12/17-22, and re-intubated 12/29, extubated12/30, trach placed 12/31.    OT comments  Focus of session on L UE neuromuscular reeducation/sensory stimulation and vision. Pt consistently following one step directions, remains impulsive.   Follow Up Recommendations  LTACH;Supervision/Assistance - 24 hour    Equipment Recommendations       Recommendations for Other Services      Precautions / Restrictions Precautions Precautions: Fall Precaution Comments: trach collar       Mobility Bed Mobility   Bed Mobility: Rolling           General bed mobility comments: Pt rolls herself to the L, mod assist to roll R.  Transfers                      Balance                                   ADL                                         General ADL Comments: Performed retrograde massage to L UE for mild edema and sensory stimulation followed by stimulation with washcloth and approximation of L shoulder elbow and wrist for proprioception.  Worked on gross grasp and release and shaping of L hand on a variety of common objects. Bilateral UE use placing items in L hand while R hand manipulated.  Pt reporting diplopia, noted to keep L eye closed.  Worked on tracking with L eye, R eye occluded and then with eyes together following bright colored object. Pt able to communicate when she urinated and needed to be cleaned up, but not prior to urinating.       Vision                     Perception     Praxis      Cognition   Behavior During Therapy: Impulsive Overall Cognitive Status: Difficult to assess                  General Comments: pt impulsive, but follows all simple directions.       Extremity/Trunk Assessment               Exercises     Shoulder Instructions       General Comments      Pertinent Vitals/ Pain       Pain Assessment: No/denies pain  Home Living                                          Prior Functioning/Environment              Frequency Min 2X/week     Progress Toward Goals  OT Goals(current goals can now be found in the care plan section)  Progress towards OT goals: Progressing toward goals  Acute Rehab OT Goals Time For Goal Achievement: 02/22/14  Plan Discharge plan needs to be updated    Co-evaluation  End of Session     Activity Tolerance Patient tolerated treatment well   Patient Left in bed;with call bell/phone within reach   Nurse Communication Other (comment) (IV alarm)        Time: 0263-7858 OT Time Calculation (min): 32 min  Charges: OT General Charges $OT Visit: 1 Procedure OT Treatments $Neuromuscular Re-education: 8-22 mins $Therapeutic Exercise: 8-22 mins  Malka So 02/19/2014, 3:37 PM [phone]

## 2014-02-19 NOTE — Progress Notes (Signed)
Rehab admissions - I spoke with [name] by phone today.  I explained that inpatient rehab is not a good option at this time.  Patient has a denial from insurance carrier and it is unlikely that I can get approval for inpatient rehab admission when patient ready for rehab.  Patient is not able to tolerate intense therapies at this time.  Patient will likely need SNF even after a potential inpatient rehab stay due to limited caregivers and ongoing medical needs.  I have therefore recommended SNF for rehab once patient is stable and ready for discharge to rehab.  Call me for questions.  #[phone]

## 2014-02-19 NOTE — Evaluation (Signed)
Passy-Muir Speaking Valve - Evaluation Patient Details  Name: Summer Bradley MRN: [id number] Date of Birth: March 04, 1945  Today's Date: 02/19/2014 Time: 1203-1223 SLP Time Calculation (min) (ACUTE ONLY): 20 min  Past Medical History:  Past Medical History  Diagnosis Date  . Hypertension   . Stroke   . A-fib   . COPD (chronic obstructive pulmonary disease)    Past Surgical History:  Past Surgical History  Procedure Laterality Date  . Abdominal surgery    . Radiology with anesthesia N/A 01/29/2014    Procedure: RADIOLOGY WITH ANESTHESIA;  Surgeon: Luanne Bras, MD;  Location: [facility];  Service: Radiology;  Laterality: N/A;  . Radiology with anesthesia N/A 02/01/2014    Procedure: RADIOLOGY WITH ANESTHESIA;  Surgeon: Rob Hickman, MD;  Location: [facility];  Service: Radiology;  Laterality: N/A;  . Tracheostomy  02/15/14    [facility]   HPI:  69 yo developed neuro changes 12/14/15with left side weakness that resulted in a fall. Found to have right MCA clot, taken to IR for removal and revascularization procedure. 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 Functionally, these deficits manifested as left buccal residue, reduced oral manipulation and control, anterior spillage, and wet phonation and cough associated with ice chips/thin liquids. Pt advanced to dysphagia 2, thin liquids on 12/16. 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 Reintubated 12/17-12/22. Revascularization 12/17. 12/18 MRI reveals new multi focal infarcts in the posterior circulation, affecting the mesial right temporal lobe, right occipital cortex, bilateral mid and upper cerebellum and right upper pons and midbrain. FEES 12/23 with recs to begin dysphagia 1, honey-thick liquids. Pt subsequently with respiratory decline, made NPO. Repeat BSE 12/25 recommended resume Dys  1, honey liquids. SLP treatment session with breakfast 12/29 with max cues with feeding due to impulsivity, however no evidence of aspiraiton.  with reintubation 12/23 pm.One hour following session pt developed airway plugging, decreased responsiveness was reintubated 12/29 and received trach 12/31, tolerating trach collar and PMSV ordered.   Assessment / Plan / Recommendation Clinical Impression  Passy-Muir speaking valve assessment completed with # 6 cuffed, deflated trach. Valve placed for intervals up to 8 seconds with evidence of air trapping, increased respiratory rate (38-40) and increased work of breathing. RT suctioned pt prior to SLP arrival which may be contributing factor for decreased tolerance. Air unable to effectively mobilize to upper airway. Sats 93% and HR 69. PMSV trials with ST only at present time with continued ST as progosis for use is good.      SLP Assessment  Patient needs continued Speech Lanaguage Pathology Services    Follow Up Recommendations   (TBD)    Frequency and Duration min 2x/week  2 weeks   Pertinent Vitals/Pain No evidence    SLP Goals Potential to Achieve Goals (ACUTE ONLY): Good Potential Considerations (ACUTE ONLY): Severity of impairments   PMSV Trial  PMSV was placed for: intervals up to 8 sec Able to redirect subglottic air through upper airway: No Able to Attain Phonation: No (not effectively) Able to Expectorate Secretions: No attempts Breath Support for Phonation: Severely decreased Respirations During Trial:  (20 baseline, rose to 38-40) SpO2 During Trial:  (93) Pulse During Trial: 69 Behavior: Lethargic;Poor eye contact   Tracheostomy Tube       Vent Dependency  FiO2 (%): 28 %    Cuff Deflation Trial Tolerated Cuff Deflation:  (cuff deflated on arrival) Behavior:  Cooperative;Poor eye contact   [name] 02/19/2014, 1:26 PM  Orbie Pyo Colvin Caroli.Ed [employer] [phone]

## 2014-02-19 NOTE — Clinical Social Work Note (Signed)
Clinical Social Worker continuing to follow patient and family for support and discharge planning needs.  CSW spoke with patient daughter, Janace Hoard, over the phone to explain patient placement barriers due to insurance and now with a new tracheostomy.  Patient daughter questioned patient ability to remain in the hospital for therapies - CSW explained that patient ability to participate and insurance remain her barrier.  CSW contacted inpatient rehab admissions coordinator who will follow up with patient daughter to provide additional support and explanation for patient placement needs.  Patient daughter still wishes for [facility] SNF placement.  CSW has updated FL2 and initiated new search for bed offers.  CSW remains available to offer support and facilitate patient discharge needs once medically stable.  Barbette Or, [place]

## 2014-02-20 LAB — BASIC METABOLIC PANEL
Anion gap: 10 (ref 5–15)
BUN: 22 mg/dL (ref 6–23)
CO2: 31 mmol/L (ref 19–32)
Calcium: 8.3 mg/dL — ABNORMAL LOW (ref 8.4–10.5)
Chloride: 100 mEq/L (ref 96–112)
Creatinine, Ser: 0.54 mg/dL (ref 0.50–1.10)
GFR calc Af Amer: >90 mL/min (ref >90)
GFR calc non Af Amer: >90 mL/min (ref >90)
Glucose, Bld: 107 mg/dL — ABNORMAL HIGH (ref 70–99)
Potassium: 3.3 mmol/L — ABNORMAL LOW (ref 3.5–5.1)
Sodium: 141 mmol/L (ref 135–145)

## 2014-02-20 LAB — CBC WITH DIFFERENTIAL/PLATELET
Basophils Absolute: 0 10*3/uL (ref 0.0–0.1)
Basophils Relative: 0 % (ref 0–1)
Eosinophils Absolute: 0.1 10*3/uL (ref 0.0–0.7)
Eosinophils Relative: 0 % (ref 0–5)
HCT: 32.5 % — ABNORMAL LOW (ref 36.0–46.0)
Hemoglobin: 9.6 g/dL — ABNORMAL LOW (ref 12.0–15.0)
Lymphocytes Relative: 5 % — ABNORMAL LOW (ref 12–46)
Lymphs Abs: 1.1 10*3/uL (ref 0.7–4.0)
MCH: 23.5 pg — ABNORMAL LOW (ref 26.0–34.0)
MCHC: 29.5 g/dL — ABNORMAL LOW (ref 30.0–36.0)
MCV: 79.7 fL (ref 78.0–100.0)
Monocytes Absolute: 1.6 10*3/uL — ABNORMAL HIGH (ref 0.1–1.0)
Monocytes Relative: 7 % (ref 3–12)
Neutro Abs: 21.3 10*3/uL — ABNORMAL HIGH (ref 1.7–7.7)
Neutrophils Relative %: 88 % — ABNORMAL HIGH (ref 43–77)
Platelets: 377 10*3/uL (ref 150–400)
RBC: 4.08 MIL/uL (ref 3.87–5.11)
RDW: 20.2 % — ABNORMAL HIGH (ref 11.5–15.5)
WBC: 24.1 10*3/uL — ABNORMAL HIGH (ref 4.0–10.5)

## 2014-02-20 LAB — PHOSPHORUS: Phosphorus: 2.9 mg/dL (ref 2.3–4.6)

## 2014-02-20 LAB — MAGNESIUM: Magnesium: 1.7 mg/dL (ref 1.5–2.5)

## 2014-02-20 LAB — PROCALCITONIN: Procalcitonin: 0.1 ng/mL

## 2014-02-20 MED ORDER — POTASSIUM CHLORIDE 20 MEQ/15ML (10%) PO SOLN
40.0000 meq | Freq: Three times a day (TID) | ORAL | Status: DC
  Administered 2014-02-20 – 2014-02-24 (×12): 40 meq
  Filled 2014-02-20 (×14): qty 30

## 2014-02-20 NOTE — Clinical Social Work Note (Signed)
CSW attempted to call patients daughter, Janace Hoard, to discuss SNF bed offers placement- unable to leave message.  [name], [place] Social Worker [phone]

## 2014-02-20 NOTE — Progress Notes (Addendum)
STROKE TEAM PROGRESS NOTE   SUBJECTIVE (INTERVAL HISTORY):  Son is at bedside. Pt is on PEG and trach. Extremely dry mouth. Still has copious secretions from trach with SOB. Worsening respiratory from yesterday. Respiratory is in room for breathing treatment. Follows all commands. On TF.   OBJECTIVE Temp:  [97.1 F (36.2 C)-98.1 F (36.7 C)] 97.1 F (36.2 C) (01/05 1152) Pulse Rate:  [57-118] 118 (01/05 1152) Cardiac Rhythm:  [-] Atrial fibrillation (01/05 0900) Resp:  [17-34] 30 (01/05 1152) BP: (112-173)/(69-96) 132/85 mmHg (01/05 1152) SpO2:  [94 %-98 %] 96 % (01/05 1200) FiO2 (%):  [28 %-35 %] 30 % (01/05 1200)  No results for input(s): GLUCAP in the last 168 hours.  Recent Labs Lab 02/15/14 0500 02/16/14 0529 02/18/14 0510 02/19/14 0500 02/20/14 0618  NA 143 145 143 141 141  K 4.0 4.1 3.5 3.6 3.3*  CL 104 104 103 103 100  CO2 33* 32 37* 31 31  GLUCOSE 106* 100* 128* 144* 107*  BUN 29* 26* 23 19 22   CREATININE 0.84 0.74 0.49* 0.45* 0.54  CALCIUM 8.7 8.6 8.6 8.6 8.3*  MG  --   --  2.1 2.0 1.7  PHOS  --   --  2.2* 2.8 2.9   No results for input(s): AST, ALT, ALKPHOS, BILITOT, PROT, ALBUMIN in the last 168 hours.  Recent Labs Lab 02/16/14 0529 02/18/14 0510 02/19/14 0500 02/20/14 0618 02/20/14 0706  WBC 18.2* 20.5* 21.3* TEST REQUEST RECEIVED WITHOUT APPROPRIATE SPECIMEN 24.1*  NEUTROABS  --  17.5* 18.8* PENDING 21.3*  HGB 9.0* 9.2* 9.5* TEST REQUEST RECEIVED WITHOUT APPROPRIATE SPECIMEN 9.6*  HCT 31.8* 32.8* 31.6* TEST REQUEST RECEIVED WITHOUT APPROPRIATE SPECIMEN 32.5*  MCV 83.2 81.8 80.8 TEST REQUEST RECEIVED WITHOUT APPROPRIATE SPECIMEN 79.7  PLT 480* 435* 341 TEST REQUEST RECEIVED WITHOUT APPROPRIATE SPECIMEN 377   No results for input(s): CKTOTAL, CKMB, CKMBINDEX, TROPONINI in the last 168 hours. No results for input(s): LABPROT, INR in the last 72 hours.  Recent Labs  02/19/14 2336  COLORURINE YELLOW  LABSPEC 1.016  PHURINE 8.0  GLUCOSEU NEGATIVE   HGBUR NEGATIVE  BILIRUBINUR NEGATIVE  KETONESUR NEGATIVE  PROTEINUR NEGATIVE  UROBILINOGEN 2.0*  NITRITE NEGATIVE  LEUKOCYTESUR NEGATIVE       Component Value Date/Time   CHOL 114 01/30/2014 0337   TRIG 102 01/30/2014 0337   HDL 24* 01/30/2014 0337   CHOLHDL 4.8 01/30/2014 0337   VLDL 20 01/30/2014 0337   LDLCALC 70 01/30/2014 0337   Lab Results  Component Value Date   HGBA1C 6.4* 01/30/2014      Component Value Date/Time   LABOPIA NONE DETECTED 01/29/2014 0659   COCAINSCRNUR NONE DETECTED 01/29/2014 0659   LABBENZ NONE DETECTED 01/29/2014 0659   AMPHETMU NONE DETECTED 01/29/2014 0659   THCU NONE DETECTED 01/29/2014 0659   LABBARB NONE DETECTED 01/29/2014 0659    No results for input(s): ETH in the last 168 hours.   Ct Head Wo Contrast  02/01/2014  Stat CT scan shows no acute changes in the brain, but there appears to be a thrombus in the basilar artery that is new.  01/30/2014    1. Resolved right MCA territory hyperdensity present at 1056 hrs yesterday, most likely was post Neurointervention parenchymal contrast staining. 2. Mild cytotoxic edema suspected in that same right MCA territory. Underlying chronic ischemic changes. 3. Stable parafalcine subdural hematoma. No new intracranial hemorrhage or acute mass effect. 4. No new intracranial abnormality.    01/29/2014    Acute infarct  right MCA territory as noted on CT perfusion study. There is interval development of high density in the right temporoparietal cortex which may be due to contrast material or hemorrhage. Continued follow-up recommended.  Small interhemispheric subdural hematoma is unchanged.    01/29/2014   1. 4 mm thick posterior falx subdural hemorrhage. 2. Prominent density at the right M1/M2 junction. CTA is already planned. 3. Established infarcts in the anterior and posterior right MCA territory. No definitive acute infarct. 4. Left frontal scalp contusion.  No acute fracture.    Ct Cerebral Perfusion  W/cm 01/29/2014    1. Occlusive thrombus at the right M1-M2 junction. Subjectively good pial/pial collateral circulation. 2. Perfusion findings suggest moderate acute infarct in the central right MCA territory with rim of penumbra, as above. 3. Remote infarcts at the anterior and posterior margins of the right MCA territory. 4. Thin posterior falx subdural hemorrhage.     Cerebral Angiogram  01/29/2014 S/P Bilateral common carotid arteriograms, followed by complete revascularization of RT MCA M1 occlusion usingX2 passes with the Solitaire FR 67mm x 7mm retrieval device And 10 mg of  superselective intracranial IA Integrelin. TICI 3 flow reesablished.   MRI brain  02/02/14 1. New, acute infarcts in the posterior circulation related to treated basilar thrombosis. Infarcts present in the bilateral cerebellum, upper right brainstem, mesial right temporal lobe, and minimally along the right occipital cortex. Normal flow voids within the intracranial vessels. 2. Stable appearance of recent right MCA territory infarct. 3. Stable trace falcine subdural hemorrhage.  01/31/14 1. Acute nonhemorrhagic infarct involving the superior right temporal lobe, right insular ribbon, right frontal operculum, and posterior right frontal lobe. 2. More remote infarcts are again noted in the more anterior inferior right frontal lobe and the right parietal lobe. 3. Extensive white matter changes bilaterally likely reflect the sequela of chronic microvascular ischemia, advanced for age. 4. White matter changes extend into the brainstem. 5. Remote tender infarcts of the cerebellum bilaterally, worse on the right.  Dg Chest Port 1 View 02/08/2014    1. Right PICC line stable position. 2. Congestive heart failure with interstitial edema and small right pleural effusion. Mild improvement from prior exam. 02/10/2014 Improved bibasilar atelectasis versus airspace disease. 02/13/2014 - No pneumothorax status post  bilateral chest tube placement. Stable support apparatus  2D Echocardiogram  EF 50-55% with no source of embolus.   A1C 6.4 and LDL 70   Physical Exam General: The patient is awake, alert and following simple commands. The patient is obese.   Respiratory: decrease breath sounds at the bilateral bases with rhonchi and wheezing Cardiovascular: irregular rate and rhythm, no murmurs noted Abdomen: Soft Skin: No significant peripheral edema is noted. Neurologic Exam Mental status: The patient is cooperative, follows simple commands.  Cranial nerves: Pupils are equal, reactive to light. Difficulty with upgaze. Blnk to threat in all quadrants, VFF. Mild left facial asymmetry.  Motor: mild weakness left arm, left arm drift. Bilateral Hip flexion mild weakness. Otherwise intact.  Sensory examination: The patient has decreased sensation on the left arm Coordination: No obvious ataxia seen on the extremities. Difficult to test. Intact FTN, difficult to test HTS.  Gait and station: Gait was not tested. Reflexes: Deep tendon reflexes are brisk throughout.  ASSESSMENT/PLAN Ms. Summer Bradley is a 69 y.o. female with history of hypertension, stroke, afib and COPD presenting with Left-sided weakness. She did not receive IV t-PA due to delay in arrival, ? SDH. But underwent mechanical thrombectomy of right MCA with complete  vascularization of right MCA M1 occlusion with IA Integrilin. 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 Dr. Estanislado Pandy was able to extract the clot, and the patient has sustained a right pontine and midbrain stroke, small bilateral cerebellar strokes, and some extension of the infarct in the mesial temporal area on the right.   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 While in hospital, developed bilateral posterior circulation infarcts secondary to basilar  thrombosis s/p revascularization w/ mechanical thrombectomy. Infarcts embolic secondary to Afib with subtherapeutic INR on coumadin.  Resultant left arm weakness and left facial droop  2D Echo  EF 50-55% no source of embolus  HgbA1c 6.4  eliquis po for VTE prophylaxis  aspirin 81 mg orally every day and warfarin prior to admission, changed to eliquis for stroke prevention. Resume eliquis.   Neurologically stable   PT/OT on board and recommend [place] in the near future.    Acute Respiratory Failure / COPD exacerbation   Intubated for neuro intervention-extubated 02/06/14  Tolerated well 24 hours post extubation.  Once to the floor 12/23, overnight developed more respiratory distress and concerning for aspiration  12/24 Transferred to step down unit for further monitoring  Developed the respiratory distress again on 02/13/2014, intubated and transferred to neuro ICU  Trach done on 02/15/14. Off vent now.  LTAC placement  unasyn discontinued  On Xopenex and mucomyst neb treatment  On steroids tapering  Suction PRN  Still copious secretion and SOB on trach collar, CCM is following  Atrial Fibrillation / A flutter with AV block  Home meds:  Warfarin  INR subtherapeutic on admission, INR 1.18   started Cardizem and s/p d/c amiodarone.   Rate controlled.  Resume eliquis for stroke prevention  Dysphagia  Aspiration pneumonia  Developed a respiratory distress again on 02/13/2014, intubated and transferred to neuro ICU. Concerning for aspiration again.   PEG done 02/15/14. TF restarted  Speech following   Hypertension  Continue to monitor  BP stable 120-140  On bisoprolol  Hyperlipidemia  Home meds:  Mevacor  LDL 70, goal < 70  On pravastatin  Continue statin on d/c  Trush  Fluconazole for 14 days  Started on 02/10/2014  Other Stroke Risk Factors  Advanced age  Obesity, Body mass index is 33.5 kg/(m^2).   Hx  stroke/TIA  Other Active Problems  Depression, on chronic SSRI  Anemia  Leukocytosis - on solumedrol - afebrile 18.2->20.5->21.3->24.1  Hospital day # 22   Rosalin Hawking, MD PhD Stroke Neurology 02/20/2014 12:43 PM    To contact Stroke Continuity provider, please refer to [email or web address] After hours, contact General Neurology

## 2014-02-20 NOTE — Progress Notes (Signed)
NUTRITION FOLLOW-UP  DOCUMENTATION CODES Per approved criteria  -Obesity Unspecified   INTERVENTION: - Continue Vital AF 1.2 via OGT at 40 ml/hr with 30 ml Prostat BID per tube to provide 1352 kcal (94% of kcal needs), 102 grams of protein, and 778 ml of free water.  - RD will continue to monitor for nutrition care plan.   NUTRITION DIAGNOSIS: Inadequate oral intake related to inability to eat as evidenced by NPO status; ongoing.   Goal: Enteral nutrition to provide 60-70% of estimated calorie needs (22-25 kcals/kg ideal body weight) and 100% of estimated protein needs, based on ASPEN guidelines for hypocaloric, high protein feeding in critically ill obese individuals; met  Monitor:  Vent status, TF initiation/tolerance, weight trends, labs  ASSESSMENT: Acute R MCA CVA, s/p angiography, R MCA clot retrieval 12/14 with new stroke 12/17 and back to IR intervention.  12/22: Pt extubated.  12/23: FEES done- SLP recommend dysphagia 1 with honey thick liquids. 12/29: Pt bradycardic/unresponsive- code blue called. Pt re-intubated. Pt with clinically significant pneumothorax. Chest tube placed. 12/30: right chest tube removed 12/31: s/p trach 1/2: Left chest tube removed.   - Pt currently with trach collar. Guarded but optimistic prognosis. Plan for LTAC.  - Currently tolerating TF via PEG @ goal rate of VAF @ 40 ml/hr with Prostat BID.   Labs: K Low Na WNL BUN WNL Phos and Mg WNL  Height: Ht Readings from Last 1 Encounters:  02/08/14 _0  (1.575 m)    Weight: Wt Readings from Last 1 Encounters:  02/14/14 183 lb 3.2 oz (83.1 kg)  Admission weight: 192 lb (87.2 kg) 12/14  BMI:  Body mass index is 33.5 kg/(m^2). Class I obesity  Re-Estimated Nutritional Needs: Kcal: 1200-1400 Protein: >/= 100 grams Fluid: > 1.5 L/day  Skin: incision, laceration  Diet Order:     Intake/Output Summary (Last 24 hours) at 02/20/14 1549 Last data filed at 02/20/14 1200  Gross per 24  hour  Intake    760 ml  Output      0 ml  Net    760 ml    Last BM: 1/4  Labs:   Recent Labs Lab 02/18/14 0510 02/19/14 0500 02/20/14 0618  NA 143 141 141  K 3.5 3.6 3.3*  CL 103 103 100  CO2 37* 31 31  BUN _1 CREATININE 0.49* 0.45* 0.54  CALCIUM 8.6 8.6 8.3*  MG 2.1 2.0 1.7  PHOS 2.2* 2.8 2.9  GLUCOSE 128* 144* 107*    CBG (last 3)  No results for input(s): GLUCAP in the last 72 hours.  Scheduled Meds: . acetylcysteine  2 mL Nebulization Q6H  . antiseptic oral rinse  7 mL Mouth Rinse QID  . apixaban  5 mg Oral BID  . bisoprolol  2.5 mg Oral BID  . chlorhexidine  15 mL Mouth Rinse BID  . citalopram  20 mg Per Tube QHS  . clonazePAM  0.5 mg Oral BID  . diltiazem  60 mg Oral 4 times per day  . feeding supplement (PRO-STAT SUGAR FREE 64)  30 mL Per Tube BID  . fluconazole  100 mg Oral QPC supper  . furosemide  40 mg Intravenous Q12H  . levalbuterol  0.63 mg Nebulization Q6H  . levothyroxine  150 mcg Per Tube QAC breakfast  . pantoprazole sodium  40 mg Per Tube BID  . potassium chloride  40 mEq Per Tube TID  . pravastatin  20 mg Per Tube q1800  . predniSONE  20 mg Oral Q breakfast  . sodium chloride  10-40 mL Intracatheter Q12H    Continuous Infusions: . feeding supplement (VITAL AF 1.2 CAL) 1,000 mL (02/18/14 1600)    Laurette Schimke MS, RD, LDN

## 2014-02-20 NOTE — Progress Notes (Signed)
Speech Language Pathology Treatment: Summer Bradley Speaking valve  Patient Details Name: Summer Bradley MRN: [id number] DOB: May 10, 1945 Today's Date: 02/20/2014 Time: 1320-1340 SLP Time Calculation (min) (ACUTE ONLY): 20 min  Assessment / Plan / Recommendation Clinical Impression  PMSV donned up to max for 4-15 seconds with evidence of air trapping (valve popping off trach and audible rush of air once valve removed). Unable to mobilize air toward vocal cords with moderate verbal/visual demonstration for respiratory/phonotory coordination. Treatment focused on secretion management/mobilization using donn/doff valve and cues for strong cough to clear secretions orally or via trach. Mucous reached back of trach hub, however, respiratory strength significantly decreased and unable to expel. RN called to suction. HR 113, SpO2 93-94%, RR 23-33. SLP will continue intervention.    HPI HPI: 70 yo developed neuro changes 12/14/15with left side weakness that resulted in a fall. Found to have right MCA clot, taken to IR for removal and revascularization procedure. 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 Functionally, these deficits manifested as left buccal residue, reduced oral manipulation and control, anterior spillage, and wet phonation and cough associated with ice chips/thin liquids. Pt advanced to dysphagia 2, thin liquids on 12/16. 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 Reintubated 12/17-12/22. Revascularization 12/17. 12/18 MRI reveals new multi focal infarcts in the posterior circulation, affecting the mesial right temporal lobe, right occipital cortex, bilateral mid and upper cerebellum and right upper pons and midbrain. FEES 12/23 with recs to begin dysphagia 1, honey-thick liquids. Pt subsequently with respiratory decline, made NPO. Repeat BSE 12/25  recommended resume Dys 1, honey liquids. SLP treatment session with breakfast 12/29 with max cues with feeding due to impulsivity, however no evidence of aspiraiton.  with reintubation 12/23 pm.One hour following session pt developed airway plugging, decreased responsiveness was reintubated 12/29 and received trach 12/31, tolerating trach collar and PMSV ordered.   Pertinent Vitals Pain Assessment: No/denies pain  SLP Plan  Continue with current plan of care    Recommendations                Oral Care Recommendations: Oral care BID Follow up Recommendations: LTACH Plan: Continue with current plan of care    GO     [name] 02/20/2014, 2:11 PM   Orbie Pyo Colvin Caroli.Ed [employer] [phone]

## 2014-02-20 NOTE — Progress Notes (Signed)
Physical Therapy Treatment Patient Details Name: Summer Bradley MRN: [id number] DOB: 01-Mar-1945 Today's Date: 02/20/2014    History of Present Illness pt presents post R MCA clot retrieval with revascularization.  pt intubated 12/14-15, 12/17-22, and re-intubated 12/29, extubated12/30, trach placed 12/31.     PT Comments    Patient seen for activity tolerance and mobility this afternoon. Patient received soiled in bed, assisted patient with bed mobility and hygiene, patient able to follow simple commands and assist with all aspects of bed mobility. Patient also tolerated BLE therapeutic activities including bridging in bed and ankle ROM. Deferred EOB mobility this session secondary to increased fatigue.  Patient assisted to modified chair position in bed and left with SLP. Will continue to see and progress as tolerated.    Follow Up Recommendations  LTACH     Equipment Recommendations  None recommended by PT    Recommendations for Other Services Rehab consult     Precautions / Restrictions Precautions Precautions: Fall Precaution Comments: trach collar Restrictions Weight Bearing Restrictions: No    Mobility  Bed Mobility Overal bed mobility: Needs Assistance;+2 for physical assistance Bed Mobility: Rolling Rolling: Min assist;Mod assist   Supine to sit:  (assisted to upright chair position in bed and left with SLP)     General bed mobility comments: Patient able to roll back and forth several times during session with min assist, and VCs, increased assist with fatigue, did not perform EOB this session secondary to fatigue post hygiene  Transfers                    Ambulation/Gait                 Stairs            Wheelchair Mobility    Modified Rankin (Stroke Patients Only)       Balance                                    Cognition Arousal/Alertness: Awake/alert Behavior During Therapy: Impulsive Overall Cognitive Status:  Difficult to assess                 General Comments: patient continues to follow simple commands this session    Exercises      General Comments        Pertinent Vitals/Pain Pain Assessment: No/denies pain    Home Living                      Prior Function            PT Goals (current goals can now be found in the care plan section) Acute Rehab PT Goals Patient Stated Goal: not stated PT Goal Formulation: With patient/family Time For Goal Achievement: 02/19/14 Potential to Achieve Goals: Good Progress towards PT goals: Progressing toward goals    Frequency  Min 2X/week    PT Plan Current plan remains appropriate;Frequency needs to be updated    Co-evaluation             End of Session Equipment Utilized During Treatment:  (Vent) Activity Tolerance: Patient limited by fatigue Patient left: in bed;with call bell/phone within reach (with SLP in modified chair position in bed)     Time: 3614-4315 PT Time Calculation (min) (ACUTE ONLY): 14 min  Charges:  $Therapeutic Activity: 8-22 mins  G CodesDuncan Dull 2014/03/06, 1:48 PM Alben Deeds, [place] DPT  [phone]

## 2014-02-21 LAB — BASIC METABOLIC PANEL
Anion gap: 7 (ref 5–15)
BUN: 27 mg/dL — ABNORMAL HIGH (ref 6–23)
CO2: 32 mmol/L (ref 19–32)
Calcium: 9 mg/dL (ref 8.4–10.5)
Chloride: 101 mEq/L (ref 96–112)
Creatinine, Ser: 0.58 mg/dL (ref 0.50–1.10)
GFR calc Af Amer: >90 mL/min (ref >90)
GFR calc non Af Amer: >90 mL/min (ref >90)
Glucose, Bld: 119 mg/dL — ABNORMAL HIGH (ref 70–99)
Potassium: 3.8 mmol/L (ref 3.5–5.1)
Sodium: 140 mmol/L (ref 135–145)

## 2014-02-21 LAB — CULTURE, RESPIRATORY W GRAM STAIN: Special Requests: NORMAL

## 2014-02-21 LAB — CBC WITH DIFFERENTIAL/PLATELET
Basophils Absolute: 0 10*3/uL (ref 0.0–0.1)
Basophils Relative: 0 % (ref 0–1)
Eosinophils Absolute: 0.1 10*3/uL (ref 0.0–0.7)
Eosinophils Relative: 0 % (ref 0–5)
HCT: 36.1 % (ref 36.0–46.0)
Hemoglobin: 10.9 g/dL — ABNORMAL LOW (ref 12.0–15.0)
Lymphocytes Relative: 7 % — ABNORMAL LOW (ref 12–46)
Lymphs Abs: 1.3 10*3/uL (ref 0.7–4.0)
MCH: 24.7 pg — ABNORMAL LOW (ref 26.0–34.0)
MCHC: 30.2 g/dL (ref 30.0–36.0)
MCV: 81.7 fL (ref 78.0–100.0)
Monocytes Absolute: 1.2 10*3/uL — ABNORMAL HIGH (ref 0.1–1.0)
Monocytes Relative: 6 % (ref 3–12)
Neutro Abs: 16 10*3/uL — ABNORMAL HIGH (ref 1.7–7.7)
Neutrophils Relative %: 87 % — ABNORMAL HIGH (ref 43–77)
Platelets: 379 10*3/uL (ref 150–400)
RBC: 4.42 MIL/uL (ref 3.87–5.11)
RDW: 20.5 % — ABNORMAL HIGH (ref 11.5–15.5)
WBC: 18.5 10*3/uL — ABNORMAL HIGH (ref 4.0–10.5)

## 2014-02-21 LAB — GLUCOSE, CAPILLARY
Glucose-Capillary: 167 mg/dL — ABNORMAL HIGH (ref 70–99)
Glucose-Capillary: 149 mg/dL — ABNORMAL HIGH (ref 70–99)

## 2014-02-21 LAB — CULTURE, RESPIRATORY: Culture: NO GROWTH

## 2014-02-21 LAB — MAGNESIUM: Magnesium: 2.1 mg/dL (ref 1.5–2.5)

## 2014-02-21 LAB — PHOSPHORUS: Phosphorus: 3.2 mg/dL (ref 2.3–4.6)

## 2014-02-21 MED ORDER — CLONAZEPAM 0.5 MG PO TABS
0.5000 mg | ORAL_TABLET | Freq: Every day | ORAL | Status: DC
  Administered 2014-02-21 – 2014-02-22 (×2): 0.5 mg via ORAL
  Filled 2014-02-21 (×2): qty 1

## 2014-02-21 MED ORDER — CLONAZEPAM 0.5 MG PO TABS: 0.5000 mg | ORAL_TABLET | Freq: Every day | ORAL | Status: DC

## 2014-02-21 NOTE — Progress Notes (Signed)
[place] for Apixaban Indication: atrial fibrillation  Allergies  Allergen Reactions  . Lisinopril     Unknown reaction    Patient Measurements: Height: 5\' 2"  (157.5 cm) Weight: 183 lb 3.2 oz (83.1 kg) IBW/kg (Calculated) : 50.1  Vital Signs: Temp: 97.8 F (36.6 C) (01/06 1216) Temp Source: Oral (01/06 1216) BP: 136/75 mmHg (01/06 1216) Pulse Rate: 75 (01/06 1246)  Labs:  Recent Labs  02/19/14 0500 02/20/14 0618 02/20/14 0706 02/21/14 0422  HGB 9.5* TEST REQUEST RECEIVED WITHOUT APPROPRIATE SPECIMEN 9.6* 10.9*  HCT 31.6* TEST REQUEST RECEIVED WITHOUT APPROPRIATE SPECIMEN 32.5* 36.1  PLT 341 TEST REQUEST RECEIVED WITHOUT APPROPRIATE SPECIMEN 377 379  CREATININE 0.45* 0.54  --  0.58    Estimated Creatinine Clearance: 66.3 mL/min (by C-G formula based on Cr of 0.58).   Medical History: Past Medical History  Diagnosis Date  . Hypertension   . Stroke   . A-fib   . COPD (chronic obstructive pulmonary disease)     Medications:  Scheduled:  . acetylcysteine  2 mL Nebulization Q6H  . antiseptic oral rinse  7 mL Mouth Rinse QID  . apixaban  5 mg Oral BID  . bisoprolol  2.5 mg Oral BID  . chlorhexidine  15 mL Mouth Rinse BID  . citalopram  20 mg Per Tube QHS  . clonazePAM  0.5 mg Oral BID  . diltiazem  60 mg Oral 4 times per day  . feeding supplement (PRO-STAT SUGAR FREE 64)  30 mL Per Tube BID  . fluconazole  100 mg Oral QPC supper  . furosemide  40 mg Intravenous Q12H  . levalbuterol  0.63 mg Nebulization Q6H  . levothyroxine  150 mcg Per Tube QAC breakfast  . pantoprazole sodium  40 mg Per Tube BID  . potassium chloride  40 mEq Per Tube TID  . pravastatin  20 mg Per Tube q1800  . predniSONE  20 mg Oral Q breakfast  . sodium chloride  10-40 mL Intracatheter Q12H    Assessment: 69 y.o. female on apixaban for Afib. Apixaban resumed 02/16/14.  CBC stable. No bleeding noted. SCr stable.   Goal of Therapy:  Prevention  of stroke Monitor platelets by anticoagulation protocol: Yes   Plan:  Continue Apixaban 5mg  po BID Will f/u renal function, s/s bleeding. Discharge education completed.   Thanks for allowing pharmacy to be a part of this patient's care.  Sloan Leiter, PharmD, BCPS Clinical Pharmacist [phone]  02/21/2014,12:54 PM

## 2014-02-21 NOTE — Progress Notes (Signed)
STROKE TEAM PROGRESS NOTE   SUBJECTIVE (INTERVAL HISTORY):  Son and daughter are at bedside. Pt is on PEG and trach. Still has dry mouth. Less secretion as before. More awake alert. Respiratory is in room for breathing treatment. Follows all commands. On TF. Discussed with the social worker regarding potential Friday transfer to nursing home.  OBJECTIVE Temp:  [97.4 F (36.3 C)-97.8 F (36.6 C)] 97.6 F (36.4 C) (01/06 1633) Pulse Rate:  [63-96] 78 (01/06 1633) Cardiac Rhythm:  [-] Normal sinus rhythm (01/06 0735) Resp:  [17-33] 28 (01/06 1633) BP: (95-147)/(51-87) 96/73 mmHg (01/06 1633) SpO2:  [91 %-99 %] 99 % (01/06 1633) FiO2 (%):  [28 %] 28 % (01/06 1633)   Recent Labs Lab 02/21/14 1220  GLUCAP 167*    Recent Labs Lab 02/16/14 0529 02/18/14 0510 02/19/14 0500 02/20/14 0618 02/21/14 0422  NA 145 143 141 141 140  K 4.1 3.5 3.6 3.3* 3.8  CL 104 103 103 100 101  CO2 32 37* 31 31 32  GLUCOSE 100* 128* 144* 107* 119*  BUN 26* 23 19 22  27*  CREATININE 0.74 0.49* 0.45* 0.54 0.58  CALCIUM 8.6 8.6 8.6 8.3* 9.0  MG  --  2.1 2.0 1.7 2.1  PHOS  --  2.2* 2.8 2.9 3.2   No results for input(s): AST, ALT, ALKPHOS, BILITOT, PROT, ALBUMIN in the last 168 hours.  Recent Labs Lab 02/18/14 0510 02/19/14 0500 02/20/14 0618 02/20/14 0706 02/21/14 0422  WBC 20.5* 21.3* TEST REQUEST RECEIVED WITHOUT APPROPRIATE SPECIMEN 24.1* 18.5*  NEUTROABS 17.5* 18.8* PENDING 21.3* 16.0*  HGB 9.2* 9.5* TEST REQUEST RECEIVED WITHOUT APPROPRIATE SPECIMEN 9.6* 10.9*  HCT 32.8* 31.6* TEST REQUEST RECEIVED WITHOUT APPROPRIATE SPECIMEN 32.5* 36.1  MCV 81.8 80.8 TEST REQUEST RECEIVED WITHOUT APPROPRIATE SPECIMEN 79.7 81.7  PLT 435* 341 TEST REQUEST RECEIVED WITHOUT APPROPRIATE SPECIMEN 377 379   No results for input(s): CKTOTAL, CKMB, CKMBINDEX, TROPONINI in the last 168 hours. No results for input(s): LABPROT, INR in the last 72 hours.  Recent Labs  02/19/14 2336  COLORURINE YELLOW  LABSPEC  1.016  PHURINE 8.0  GLUCOSEU NEGATIVE  HGBUR NEGATIVE  BILIRUBINUR NEGATIVE  KETONESUR NEGATIVE  PROTEINUR NEGATIVE  UROBILINOGEN 2.0*  NITRITE NEGATIVE  LEUKOCYTESUR NEGATIVE       Component Value Date/Time   CHOL 114 01/30/2014 0337   TRIG 102 01/30/2014 0337   HDL 24* 01/30/2014 0337   CHOLHDL 4.8 01/30/2014 0337   VLDL 20 01/30/2014 0337   LDLCALC 70 01/30/2014 0337   Lab Results  Component Value Date   HGBA1C 6.4* 01/30/2014      Component Value Date/Time   LABOPIA NONE DETECTED 01/29/2014 0659   COCAINSCRNUR NONE DETECTED 01/29/2014 0659   LABBENZ NONE DETECTED 01/29/2014 0659   AMPHETMU NONE DETECTED 01/29/2014 0659   THCU NONE DETECTED 01/29/2014 0659   LABBARB NONE DETECTED 01/29/2014 0659    No results for input(s): ETH in the last 168 hours.   Ct Head Wo Contrast  02/01/2014  Stat CT scan shows no acute changes in the brain, but there appears to be a thrombus in the basilar artery that is new.  01/30/2014    1. Resolved right MCA territory hyperdensity present at 1056 hrs yesterday, most likely was post Neurointervention parenchymal contrast staining. 2. Mild cytotoxic edema suspected in that same right MCA territory. Underlying chronic ischemic changes. 3. Stable parafalcine subdural hematoma. No new intracranial hemorrhage or acute mass effect. 4. No new intracranial abnormality.    01/29/2014  Acute infarct right MCA territory as noted on CT perfusion study. There is interval development of high density in the right temporoparietal cortex which may be due to contrast material or hemorrhage. Continued follow-up recommended.  Small interhemispheric subdural hematoma is unchanged.    01/29/2014   1. 4 mm thick posterior falx subdural hemorrhage. 2. Prominent density at the right M1/M2 junction. CTA is already planned. 3. Established infarcts in the anterior and posterior right MCA territory. No definitive acute infarct. 4. Left frontal scalp contusion.  No acute  fracture.    Ct Cerebral Perfusion W/cm 01/29/2014    1. Occlusive thrombus at the right M1-M2 junction. Subjectively good pial/pial collateral circulation. 2. Perfusion findings suggest moderate acute infarct in the central right MCA territory with rim of penumbra, as above. 3. Remote infarcts at the anterior and posterior margins of the right MCA territory. 4. Thin posterior falx subdural hemorrhage.     Cerebral Angiogram  01/29/2014 S/P Bilateral common carotid arteriograms, followed by complete revascularization of RT MCA M1 occlusion usingX2 passes with the Solitaire FR 26mm x 59mm retrieval device And 10 mg of  superselective intracranial IA Integrelin. TICI 3 flow reesablished.   MRI brain  02/02/14 1. New, acute infarcts in the posterior circulation related to treated basilar thrombosis. Infarcts present in the bilateral cerebellum, upper right brainstem, mesial right temporal lobe, and minimally along the right occipital cortex. Normal flow voids within the intracranial vessels. 2. Stable appearance of recent right MCA territory infarct. 3. Stable trace falcine subdural hemorrhage.  01/31/14 1. Acute nonhemorrhagic infarct involving the superior right temporal lobe, right insular ribbon, right frontal operculum, and posterior right frontal lobe. 2. More remote infarcts are again noted in the more anterior inferior right frontal lobe and the right parietal lobe. 3. Extensive white matter changes bilaterally likely reflect the sequela of chronic microvascular ischemia, advanced for age. 4. White matter changes extend into the brainstem. 5. Remote tender infarcts of the cerebellum bilaterally, worse on the right.  Dg Chest Port 1 View 02/08/2014    1. Right PICC line stable position. 2. Congestive heart failure with interstitial edema and small right pleural effusion. Mild improvement from prior exam. 02/10/2014 Improved bibasilar atelectasis versus airspace disease. 02/13/2014 -  No pneumothorax status post bilateral chest tube placement. Stable support apparatus 02/19/2014- Resolved left basilar pneumothorax. Bilateral hazy airspace disease not significantly changed  2D Echocardiogram  EF 50-55% with no source of embolus.   A1C 6.4 and LDL 70   Physical Exam General: The patient is awake, alert and following simple commands. The patient is obese.   Respiratory: decrease breath sounds at the bilateral bases with rhonchi and wheezing Cardiovascular: irregular rate and rhythm, no murmurs noted Abdomen: Soft Skin: No significant peripheral edema is noted. Neurologic Exam Mental status: The patient is cooperative, follows simple commands.  Cranial nerves: Pupils are equal, reactive to light. Difficulty with upgaze. Blnk to threat in all quadrants, VFF. Mild left facial asymmetry.  Motor: mild weakness left arm, left arm drift. Bilateral Hip flexion mild weakness. Otherwise intact.  Sensory examination: The patient has decreased sensation on the left arm Coordination: No obvious ataxia seen on the extremities. Difficult to test. Intact FTN, difficult to test HTS.  Gait and station: Gait was not tested. Reflexes: Deep tendon reflexes are brisk throughout.  ASSESSMENT/PLAN Ms. Summer Bradley is a 69 y.o. female with history of hypertension, stroke, afib and COPD presenting with Left-sided weakness. She did not receive IV t-PA due to  delay in arrival, ? SDH. But underwent mechanical thrombectomy of right MCA with complete vascularization of right MCA M1 occlusion with IA Integrilin. 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 Dr. Estanislado Pandy was able to extract the clot, and the patient has sustained a right pontine and midbrain stroke, small bilateral cerebellar strokes, and some extension of the infarct in the mesial temporal area on the right.   Stroke:  R MCA infarct secondary to R MCA occlusion s/p revascularization with  mechanical thrombectomy and IA integrelin. 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 Infarcts embolic secondary to Afib with subtherapeutic INR on coumadin.  Resultant left arm weakness and left facial droop  2D Echo  EF 50-55% no source of embolus  HgbA1c 6.4  eliquis po for VTE prophylaxis  aspirin 81 mg orally every day and warfarin prior to admission, changed to eliquis for stroke prevention. Resume eliquis.   Neurologically stable   PT/OT on board and recommend [place] in the near future.    Acute Respiratory Failure / COPD exacerbation   Intubated for neuro intervention-extubated 02/06/14  Tolerated well 24 hours post extubation.  Once to the floor 12/23, overnight developed more respiratory distress and concerning for aspiration  12/24 Transferred to step down unit for further monitoring  Developed the respiratory distress again on 02/13/2014, intubated and transferred to neuro ICU  Trach done on 02/15/14. Off vent now.  LTAC placement  unasyn discontinued  On Xopenex and mucomyst neb treatment  On steroids tapering  Suction PRN  Atrial Fibrillation / A flutter with AV block  Home meds:  Warfarin  INR subtherapeutic on admission, INR 1.18   started Cardizem and s/p d/c amiodarone.   Rate controlled.  Resume eliquis for stroke prevention  Dysphagia  Aspiration pneumonia  Developed a respiratory distress again on 02/13/2014, intubated and transferred to neuro ICU. Concerning for aspiration again.   PEG done 02/15/14. TF restarted  Speech following   Hypertension  Continue to monitor  BP stable 120-140  On bisoprolol  Hyperlipidemia  Home meds:  Mevacor  LDL 70, goal < 70  On pravastatin  Continue statin on d/c  Trush  Fluconazole for 14 days  Started on 02/10/2014  Other Stroke Risk  Factors  Advanced age  Obesity, Body mass index is 33.5 kg/(m^2).   Hx stroke/TIA  Other Active Problems  Depression, on chronic SSRI  Anemia  Leukocytosis - on solumedrol - afebrile 18.2->20.5->21.3->24.1->18.5  Hospital day # 23   Rosalin Hawking, MD PhD Stroke Neurology 02/21/2014 5:01 PM    To contact Stroke Continuity provider, please refer to [email or web address] After hours, contact General Neurology

## 2014-02-21 NOTE — Progress Notes (Addendum)
Speech Language Pathology Treatment: Nada Boozer Speaking valve;Cognitive-Linquistic  Patient Details Name: Summer Bradley MRN: [id number] DOB: 08-May-1945 Today's Date: 02/21/2014 Time: 4888-9169 SLP Time Calculation (min) (ACUTE ONLY): 22 min  Assessment / Plan / Recommendation Clinical Impression  PMSV treatment with daughter and son present. SLP educated daughter re: PMSV benefits and precautions. [place] tolerated valve longer today up to 30 seconds compared to prior sessions with continued evidence of air trapping. Vocalization x 2 with hoarse quality and low intensity with moderate verbal cues for deep inhalations. Trace amount of expectorations via trach x 1.She required moderate verbal cues to arouse and sustain attention and max for emergent awareness. Pt making slow and steady progress towards goals. Can she have a cuffless trach which would allow increased airflow for verbal expression.   HPI HPI: 69 yo developed neuro changes 12/14/15with left side weakness that resulted in a fall. Found to have right MCA clot, taken to IR for removal and revascularization procedure. 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 Functionally, these deficits manifested as left buccal residue, reduced oral manipulation and control, anterior spillage, and wet phonation and cough associated with ice chips/thin liquids. Pt advanced to dysphagia 2, thin liquids on 12/16. 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 Reintubated 12/17-12/22. Revascularization 12/17. 12/18 MRI reveals new multi focal infarcts in the posterior circulation, affecting the mesial right temporal lobe, right occipital cortex, bilateral mid and upper cerebellum and right upper pons and midbrain. FEES 12/23 with recs to begin dysphagia 1, honey-thick liquids. Pt subsequently with respiratory decline, made NPO.  Repeat BSE 12/25 recommended resume Dys 1, honey liquids. SLP treatment session with breakfast 12/29 with max cues with feeding due to impulsivity, however no evidence of aspiraiton.  with reintubation 12/23 pm.One hour following session pt developed airway plugging, decreased responsiveness was reintubated 12/29 and received trach 12/31, tolerating trach collar and PMSV ordered.   Pertinent Vitals Pain Assessment: No/denies pain  SLP Plan  Continue with current plan of care    Recommendations        Patient may use Passy-Muir Speech Valve: with SLP only PMSV Supervision: Full MD: Please consider changing trach tube to : Cuffless       Oral Care Recommendations: Oral care BID Follow up Recommendations: LTACH Plan: Continue with current plan of care    GO     [name] 02/21/2014, 1:36 PM  Orbie Pyo Colvin Caroli.Ed [employer] [phone]

## 2014-02-21 NOTE — Clinical Social Work Note (Addendum)
12:30pm  CSW received phone call from [place] asking CSW to widen bed search.  CSW sent out bed search for [place] at [place] request.  10:30am  CSW spoke with patients daughter, Janace Hoard, and gave bed choice.  Patients daughter is undecided at this time.  CSW will continue to follow.  [name], [place] Social Worker [phone]

## 2014-02-22 ENCOUNTER — Inpatient Hospital Stay (HOSPITAL_COMMUNITY): Payer: Medicare HMO

## 2014-02-22 LAB — GLUCOSE, CAPILLARY
Glucose-Capillary: 126 mg/dL — ABNORMAL HIGH (ref 70–99)
Glucose-Capillary: 155 mg/dL — ABNORMAL HIGH (ref 70–99)
Glucose-Capillary: 122 mg/dL — ABNORMAL HIGH (ref 70–99)

## 2014-02-22 LAB — PROCALCITONIN: Procalcitonin: 0.24 ng/mL

## 2014-02-22 LAB — BRAIN NATRIURETIC PEPTIDE: B Natriuretic Peptide: 161.9 pg/mL — ABNORMAL HIGH (ref 0.0–100.0)

## 2014-02-22 MED ORDER — PREDNISONE 10 MG PO TABS
10.0000 mg | ORAL_TABLET | Freq: Every day | ORAL | Status: AC
  Administered 2014-02-24 – 2014-02-27 (×4): 10 mg via ORAL
  Filled 2014-02-22 (×5): qty 1

## 2014-02-22 MED ORDER — PREDNISONE 20 MG PO TABS
20.0000 mg | ORAL_TABLET | Freq: Every day | ORAL | Status: AC
  Administered 2014-02-22 – 2014-02-23 (×2): 20 mg via ORAL
  Filled 2014-02-22 (×2): qty 1

## 2014-02-22 MED ORDER — PREDNISONE 5 MG PO TABS
5.0000 mg | ORAL_TABLET | Freq: Every day | ORAL | Status: AC
  Administered 2014-02-28 – 2014-03-02 (×3): 5 mg via ORAL
  Filled 2014-02-22 (×4): qty 1

## 2014-02-22 MED ORDER — PANTOPRAZOLE SODIUM 40 MG PO PACK
40.0000 mg | PACK | Freq: Every day | ORAL | Status: DC
  Administered 2014-02-23 – 2014-02-27 (×5): 40 mg
  Filled 2014-02-22 (×5): qty 20

## 2014-02-22 NOTE — Progress Notes (Signed)
Family at bedside.  Patient has been trying to get out of bed.  Dr. Janann Colonel was paged regarding safety sitter and he responded back and gave a verbal order for a safety sitter. Staffing was notified.

## 2014-02-22 NOTE — Progress Notes (Signed)
STROKE TEAM PROGRESS NOTE   SUBJECTIVE (INTERVAL HISTORY):  Son is at bedside. Pt is on PEG and trach. Still has dry mouth. Less secretion as before. Drowsy in am but follows all commands. On TF. Discussed with the social worker regarding Friday transfer to nursing home. Contacted CCM to change trach to uncuffed trach for preparation nursing home transfer.   OBJECTIVE Temp:  [97.6 F (36.4 C)-98.7 F (37.1 C)] 98 F (36.7 C) (01/07 1225) Pulse Rate:  [57-105] 101 (01/07 1500) Cardiac Rhythm:  [-] Atrial flutter (01/07 0711) Resp:  [17-83] 22 (01/07 1500) BP: (86-130)/(46-84) 98/73 mmHg (01/07 1225) SpO2:  [94 %-100 %] 96 % (01/07 1500) FiO2 (%):  [28 %] 28 % (01/07 1500)   Recent Labs Lab 02/21/14 1220 02/21/14 1632 02/22/14 0759 02/22/14 1224  GLUCAP 167* 149* 126* 155*    Recent Labs Lab 02/16/14 0529 02/18/14 0510 02/19/14 0500 02/20/14 0618 02/21/14 0422  NA 145 143 141 141 140  K 4.1 3.5 3.6 3.3* 3.8  CL 104 103 103 100 101  CO2 32 37* 31 31 32  GLUCOSE 100* 128* 144* 107* 119*  BUN 26* 23 19 22  27*  CREATININE 0.74 0.49* 0.45* 0.54 0.58  CALCIUM 8.6 8.6 8.6 8.3* 9.0  MG  --  2.1 2.0 1.7 2.1  PHOS  --  2.2* 2.8 2.9 3.2   No results for input(s): AST, ALT, ALKPHOS, BILITOT, PROT, ALBUMIN in the last 168 hours.  Recent Labs Lab 02/18/14 0510 02/19/14 0500 02/20/14 0618 02/20/14 0706 02/21/14 0422  WBC 20.5* 21.3* TEST REQUEST RECEIVED WITHOUT APPROPRIATE SPECIMEN 24.1* 18.5*  NEUTROABS 17.5* 18.8* PENDING 21.3* 16.0*  HGB 9.2* 9.5* TEST REQUEST RECEIVED WITHOUT APPROPRIATE SPECIMEN 9.6* 10.9*  HCT 32.8* 31.6* TEST REQUEST RECEIVED WITHOUT APPROPRIATE SPECIMEN 32.5* 36.1  MCV 81.8 80.8 TEST REQUEST RECEIVED WITHOUT APPROPRIATE SPECIMEN 79.7 81.7  PLT 435* 341 TEST REQUEST RECEIVED WITHOUT APPROPRIATE SPECIMEN 377 379   No results for input(s): CKTOTAL, CKMB, CKMBINDEX, TROPONINI in the last 168 hours. No results for input(s): LABPROT, INR in the last 72  hours.  Recent Labs  02/19/14 2336  COLORURINE YELLOW  LABSPEC 1.016  PHURINE 8.0  GLUCOSEU NEGATIVE  HGBUR NEGATIVE  BILIRUBINUR NEGATIVE  KETONESUR NEGATIVE  PROTEINUR NEGATIVE  UROBILINOGEN 2.0*  NITRITE NEGATIVE  LEUKOCYTESUR NEGATIVE       Component Value Date/Time   CHOL 114 01/30/2014 0337   TRIG 102 01/30/2014 0337   HDL 24* 01/30/2014 0337   CHOLHDL 4.8 01/30/2014 0337   VLDL 20 01/30/2014 0337   LDLCALC 70 01/30/2014 0337   Lab Results  Component Value Date   HGBA1C 6.4* 01/30/2014      Component Value Date/Time   LABOPIA NONE DETECTED 01/29/2014 0659   COCAINSCRNUR NONE DETECTED 01/29/2014 0659   LABBENZ NONE DETECTED 01/29/2014 0659   AMPHETMU NONE DETECTED 01/29/2014 0659   THCU NONE DETECTED 01/29/2014 0659   LABBARB NONE DETECTED 01/29/2014 0659    No results for input(s): ETH in the last 168 hours.   Ct Head Wo Contrast  02/01/2014  Stat CT scan shows no acute changes in the brain, but there appears to be a thrombus in the basilar artery that is new.  01/30/2014    1. Resolved right MCA territory hyperdensity present at 1056 hrs yesterday, most likely was post Neurointervention parenchymal contrast staining. 2. Mild cytotoxic edema suspected in that same right MCA territory. Underlying chronic ischemic changes. 3. Stable parafalcine subdural hematoma. No new intracranial hemorrhage or  acute mass effect. 4. No new intracranial abnormality.    01/29/2014    Acute infarct right MCA territory as noted on CT perfusion study. There is interval development of high density in the right temporoparietal cortex which may be due to contrast material or hemorrhage. Continued follow-up recommended.  Small interhemispheric subdural hematoma is unchanged.    01/29/2014   1. 4 mm thick posterior falx subdural hemorrhage. 2. Prominent density at the right M1/M2 junction. CTA is already planned. 3. Established infarcts in the anterior and posterior right MCA territory. No  definitive acute infarct. 4. Left frontal scalp contusion.  No acute fracture.    Ct Cerebral Perfusion W/cm 01/29/2014    1. Occlusive thrombus at the right M1-M2 junction. Subjectively good pial/pial collateral circulation. 2. Perfusion findings suggest moderate acute infarct in the central right MCA territory with rim of penumbra, as above. 3. Remote infarcts at the anterior and posterior margins of the right MCA territory. 4. Thin posterior falx subdural hemorrhage.     Cerebral Angiogram  01/29/2014 S/P Bilateral common carotid arteriograms, followed by complete revascularization of RT MCA M1 occlusion usingX2 passes with the Solitaire FR 18mm x 87mm retrieval device And 10 mg of  superselective intracranial IA Integrelin. TICI 3 flow reesablished.   MRI brain  02/02/14 1. New, acute infarcts in the posterior circulation related to treated basilar thrombosis. Infarcts present in the bilateral cerebellum, upper right brainstem, mesial right temporal lobe, and minimally along the right occipital cortex. Normal flow voids within the intracranial vessels. 2. Stable appearance of recent right MCA territory infarct. 3. Stable trace falcine subdural hemorrhage.  01/31/14 1. Acute nonhemorrhagic infarct involving the superior right temporal lobe, right insular ribbon, right frontal operculum, and posterior right frontal lobe. 2. More remote infarcts are again noted in the more anterior inferior right frontal lobe and the right parietal lobe. 3. Extensive white matter changes bilaterally likely reflect the sequela of chronic microvascular ischemia, advanced for age. 4. White matter changes extend into the brainstem. 5. Remote tender infarcts of the cerebellum bilaterally, worse on the right.  Dg Chest Port 1 View 02/08/2014    1. Right PICC line stable position. 2. Congestive heart failure with interstitial edema and small right pleural effusion. Mild improvement from prior  exam. 02/10/2014 Improved bibasilar atelectasis versus airspace disease. 02/13/2014 - No pneumothorax status post bilateral chest tube placement. Stable support apparatus 02/19/2014- Resolved left basilar pneumothorax. Bilateral hazy airspace disease not significantly changed  2D Echocardiogram  EF 50-55% with no source of embolus.   A1C 6.4 and LDL 70   Physical Exam General: The patient is awake, alert and following simple commands. The patient is obese.   Respiratory: decrease breath sounds at the bilateral bases with rhonchi and wheezing Cardiovascular: irregular rate and rhythm, no murmurs noted Abdomen: Soft Skin: No significant peripheral edema is noted. Neurologic Exam Mental status: The patient is cooperative, follows simple commands.  Cranial nerves: Pupils are equal, reactive to light. Difficulty with upgaze. Blnk to threat in all quadrants, VFF. Mild left facial asymmetry.  Motor: mild weakness left arm, left arm drift. Bilateral Hip flexion mild weakness. Otherwise intact.  Sensory examination: The patient has decreased sensation on the left arm Coordination: No obvious ataxia seen on the extremities. Difficult to test. Intact FTN, difficult to test HTS.  Gait and station: Gait was not tested. Reflexes: Deep tendon reflexes are brisk throughout.  ASSESSMENT/PLAN Ms. Summer Bradley is a 69 y.o. female with history of hypertension, stroke,  afib and COPD presenting with Left-sided weakness. She did not receive IV t-PA due to delay in arrival, ? SDH. But underwent mechanical thrombectomy of right MCA with complete vascularization of right MCA M1 occlusion with IA Integrilin. 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 Dr. Estanislado Pandy was able to extract the clot, and the patient has sustained a right pontine and midbrain stroke, small bilateral cerebellar strokes, and some extension of the infarct in the mesial temporal area on the right.    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 Infarcts embolic secondary to Afib with subtherapeutic INR on coumadin.  Resultant left arm weakness and left facial droop  2D Echo  EF 50-55% no source of embolus  HgbA1c 6.4  eliquis po for VTE prophylaxis  aspirin 81 mg orally every day and warfarin prior to admission, changed to eliquis for stroke prevention. Resume eliquis.   Neurologically stable   PT/OT on board and recommend SNF  Deposition - SNF tomorrow    Acute Respiratory Failure / COPD exacerbation   Intubated for neuro intervention-extubated 02/06/14  Tolerated well 24 hours post extubation.  Once to the floor 12/23, overnight developed more respiratory distress and concerning for aspiration  12/24 Transferred to step down unit for further monitoring  Developed the respiratory distress again on 02/13/2014, intubated and transferred to neuro ICU  Trach done on 02/15/14. Off vent now.  LTAC placement  unasyn discontinued  On Xopenex and mucomyst neb treatment  On steroids tapering  Suction PRN  Atrial Fibrillation / A flutter with AV block  Home meds:  Warfarin  INR subtherapeutic on admission, INR 1.18   started Cardizem and s/p d/c amiodarone.   Rate controlled.  Resume eliquis for stroke prevention  Dysphagia  Aspiration pneumonia  Developed a respiratory distress again on 02/13/2014, intubated and transferred to neuro ICU. Concerning for aspiration again.   PEG done 02/15/14. TF restarted  Speech following   Hypertension  Continue to monitor  BP stable 120-140  On bisoprolol  Hyperlipidemia  Home meds:  Mevacor  LDL 70, goal < 70  On pravastatin  Continue statin on d/c  Trush  Fluconazole for  14 days  Started on 02/10/2014  Other Stroke Risk Factors  Advanced age  Obesity, Body mass index is 33.5 kg/(m^2).   Hx stroke/TIA  Other Active Problems  Depression, on chronic SSRI  Anemia  Leukocytosis - on solumedrol - afebrile 18.2->20.5->21.3->24.1->18.5  Hospital day # 24   Rosalin Hawking, MD PhD Stroke Neurology 02/22/2014 3:16 PM    To contact Stroke Continuity provider, please refer to [email or web address] After hours, contact General Neurology

## 2014-02-22 NOTE — Progress Notes (Signed)
Patient has been trying to get out of bed with family at bedside.  Dr. Estanislado Pandy was paged to get an order for safety sitter.  Awaiting for him to call me back or for him to write an order.

## 2014-02-22 NOTE — Progress Notes (Addendum)
Occupational Therapy Treatment Patient Details Name: Summer Bradley MRN: [id number] DOB: Dec 19, 1945 Today's Date: 02/22/2014    History of present illness pt presents post R MCA clot retrieval with revascularization.  pt intubated 12/14-15, 12/17-22, and re-intubated 12/29, extubated12/30, trach placed 12/31.    OT comments  Pt making excellent progress.  She was able to perform simple grooming tasks with min - mod A, and was able to transfer to chair with mod A +2.  Lt inattention noted.  HR increased to 159 with activity.   Follow Up Recommendations  SNF (Pt does not qualify for LTACH)    Equipment Recommendations  3 in 1 bedside comode    Recommendations for Other Services      Precautions / Restrictions Precautions Precautions: Fall Precaution Comments: trach collar       Mobility Bed Mobility Overal bed mobility: Needs Assistance;+2 for physical assistance Bed Mobility: Supine to Sit     Supine to sit: Mod assist     General bed mobility comments: Assist to guide LEs off bed and assit to lift trunk   Transfers Overall transfer level: Needs assistance Equipment used: Rolling walker (2 wheeled) Transfers: Sit to/from [facility] Sit to Stand: Min assist;+2 physical assistance Stand pivot transfers: Mod assist;+2 physical assistance       General transfer comment: Pt requires cues for hand placement.  She stands with min A +2, but requires mod A +2 to pivot as she requires assist for balance, and to advance Lt.     Balance Overall balance assessment: Needs assistance Sitting-balance support: Feet supported Sitting balance-Leahy Scale: Fair Sitting balance - Comments: Pt able to maintain EOB sitting with min guard statically.  Unable to engage in dynamic activity without LOB.  Pt with Lt lateral lean which worsens as she fatigues  Postural control: Left lateral lean Standing balance support: Bilateral upper extremity supported Standing balance-Leahy  Scale: Poor                     ADL   Eating/Feeding: NPO   Grooming: Wash/dry face;Brushing hair;Moderate assistance Grooming Details (indicate cue type and reason): Pt able to wash face.  She combs Rt side of head, but requires mod cues and mod A to attend to Lt side of head.                  Toilet Transfer: Moderate assistance;+2 for physical assistance;Stand-pivot;RW;BSC   Toileting- Clothing Manipulation and Hygiene: Total assistance;Sit to/from stand Toileting - Clothing Manipulation Details (indicate cue type and reason): Pt incontinent of urine and was assisted with clean up      Functional mobility during ADLs: Moderate assistance;+2 for physical assistance General ADL Comments: Pt fatigues quickly       Vision                 Additional Comments: Pt closes Lt eye frequently.  Has to turn head to locate items in Lt. periphery    Perception     Praxis      Cognition   Behavior During Therapy: Impulsive Overall Cognitive Status: Impaired/Different from baseline Area of Impairment: Attention   Current Attention Level: Selective    Following Commands: Follows one step commands consistently Safety/Judgement: Decreased awareness of deficits   Problem Solving: Slow processing;Decreased initiation;Difficulty sequencing;Requires verbal cues;Requires tactile cues General Comments: Pt oriented.  She is imulsive.  Requires cues for sequencing and problem solving     Extremity/Trunk Assessment  Exercises     Shoulder Instructions       General Comments      Pertinent Vitals/ Pain       Pain Assessment: No/denies pain  Home Living                                          Prior Functioning/Environment              Frequency Min 2X/week     Progress Toward Goals  OT Goals(current goals can now be found in the care plan section)  Progress towards OT goals: Progressing toward goals  ADL  Goals Pt Will Perform Grooming: with min assist;standing Pt Will Perform Upper Body Bathing: with set-up;with supervision;sitting Pt Will Perform Upper Body Dressing: with set-up;with supervision;sitting Pt Will Transfer to Toilet: with supervision;ambulating;bedside commode  Plan Discharge plan needs to be updated    Co-evaluation    PT/OT/SLP Co-Evaluation/Treatment: Yes     OT goals addressed during session: ADL's and self-care;Strengthening/ROM      End of Session Equipment Utilized During Treatment: Gait belt;Rolling walker;Other (comment)   Activity Tolerance Patient tolerated treatment well   Patient Left in chair;with call bell/phone within reach;with chair alarm set;with family/visitor present   Nurse Communication Mobility status        Time: 3254-9826 OT Time Calculation (min): 39 min  Charges: OT General Charges $OT Visit: 1 Procedure OT Treatments $Self Care/Home Management : 8-22 mins  Summer Bradley 02/22/2014, 2:35 PM

## 2014-02-22 NOTE — Progress Notes (Signed)
Physical Therapy Treatment Patient Details Name: Summer Bradley MRN: [id number] DOB: [birth year] Today's Date: 02/22/2014    History of Present Illness pt presents post R MCA clot retrieval with revascularization.  pt intubated 12/14-15, 12/17-22, and re-intubated 12/29, extubated12/30, trach placed 12/31.     PT Comments    Patient making significant progress this session. Patient tolerated some standing activities and was able to SPT OOB to chair with assist. Patient continues to fatigue quickly and requires increased assist for balance and mobility. Will continue current POC and progress as tolerated.  Follow Up Recommendations  SNF     Equipment Recommendations  None recommended by PT    Recommendations for Other Services Rehab consult     Precautions / Restrictions Precautions Precautions: Fall Precaution Comments: trach collar    Mobility  Bed Mobility Overal bed mobility: Needs Assistance;+2 for physical assistance Bed Mobility: Supine to Sit     Supine to sit: Mod assist     General bed mobility comments: Assist to guide LEs off bed and assit to lift trunk   Transfers Overall transfer level: Needs assistance Equipment used: Rolling walker (2 wheeled) Transfers: Sit to/from [facility] Sit to Stand: Min assist;+2 physical assistance Stand pivot transfers: Mod assist;+2 physical assistance       General transfer comment: Pt requires cues for hand placement.  She stands with min A +2, but requires mod A +2 to pivot as she requires assist for balance, and to advance Lt.   Ambulation/Gait                 Stairs            Wheelchair Mobility    Modified Rankin (Stroke Patients Only)       Balance Overall balance assessment: Needs assistance Sitting-balance support: Feet supported Sitting balance-Leahy Scale: Fair Sitting balance - Comments: Pt able to maintain EOB sitting with min guard statically.  Unable to engage in dynamic  activity without LOB.  Pt with Lt lateral lean which worsens as she fatigues  Postural control: Left lateral lean Standing balance support: Bilateral upper extremity supported Standing balance-Leahy Scale: Poor Standing balance comment: manual assist to hold grip on RW                    Cognition Arousal/Alertness: Awake/alert Behavior During Therapy: Impulsive Overall Cognitive Status: Impaired/Different from baseline Area of Impairment: Attention   Current Attention Level: Selective   Following Commands: Follows one step commands consistently Safety/Judgement: Decreased awareness of deficits   Problem Solving: Slow processing;Decreased initiation;Difficulty sequencing;Requires verbal cues;Requires tactile cues General Comments: Pt oriented.  She is imulsive.  Requires cues for sequencing and problem solving     Exercises      General Comments        Pertinent Vitals/Pain Pain Assessment: No/denies pain    Home Living                      Prior Function            PT Goals (current goals can now be found in the care plan section) Acute Rehab PT Goals Patient Stated Goal: to go home PT Goal Formulation: With patient Time For Goal Achievement: 03/08/14 Potential to Achieve Goals: Good Progress towards PT goals: Progressing toward goals    Frequency  Min 2X/week    PT Plan Current plan remains appropriate;Frequency needs to be updated    Co-evaluation PT/OT/SLP Co-Evaluation/Treatment: Yes  PT goals addressed during session: Mobility/safety with mobility OT goals addressed during session: ADL's and self-care;Strengthening/ROM     End of Session Equipment Utilized During Treatment:  (Vent) Activity Tolerance: Patient limited by fatigue Patient left: in chair;with call bell/phone within reach;with chair alarm set     Time: 1314-3888 PT Time Calculation (min) (ACUTE ONLY): 39 min  Charges:  $Therapeutic Activity: 23-37 mins                     G CodesDuncan Dull 03-14-14, 4:00 PM Alben Deeds, [place] DPT  [phone]

## 2014-02-22 NOTE — Progress Notes (Signed)
Dr. Estanislado Pandy called back and stated that he is not the covering physician for Dr. Erlinda Hong.  MD's office was called and spoke with Inez Catalina and Dr. Jannifer Franklin is covering at this time.  Awaiting for him to call me back.

## 2014-02-22 NOTE — Procedures (Signed)
Tracheostomy Change Note  Patient Details:   Name: Summer Bradley DOB: 06-Sep-1945 MRN: [id number]    Airway Documentation: AIRWAYS 7.5 mm (Active)  Secured at (cm) 7.5 cm 02/13/2014 12:00 AM     Evaluation  O2 sats: stable throughout Complications: No apparent complications Patient did tolerate procedure well. Bilateral Breath Sounds: Diminished Suctioning: Airway  Changed Pt's trach from 6 Shiley Cuffed to a 6 Shiley Cuffless per MD order for pt to go to SNF. No complications. Pt stable throughout. ETCO2 detector noted positive color change with good breath sounds (diminished throughout). Pt denied SOB at this time. Pt placed back on 5L/28% trach collar.   Jesse Sans 02/22/2014, 4:34 PM

## 2014-02-22 NOTE — Trach Care Team (Signed)
[facility] Progression Note   Patient Details Name: Summer Bradley MRN: [id number] DOB: 1945-06-20 Today's Date: 02/22/2014   Tracheostomy Assessment    Tracheostomy Shiley 6 mm Cuffed (Active)  Status Secured 02/22/2014 11:47 AM  Site Assessment Clean;Dry 02/22/2014 11:26 AM  Site Care Cleansed;Dried 02/22/2014  5:00 AM  Inner Cannula Care Changed/new 02/22/2014  5:00 AM  Ties Assessment Secure 02/22/2014 11:47 AM  Cuff pressure (cm) 0 cm 02/21/2014  4:30 PM  Emergency Equipment at bedside Yes 02/22/2014 11:47 AM     Care Needs Suture removal post-op day #7 : 02/22/14 Sutures removed:  (bedside RT aware sutures are due to be removed today)   Respiratory Therapy O2 Device: Tracheostomy Collar FiO2 (%): 28 % SpO2: 98 % Education:  (none needed at this time) Follow up recommendations:  (will continue to follow for pt progress) Respiratory barriers to progression:  (? discharge to SNF)    Speech Language Pathology  Patient may use Passy-Muir Speech Valve: During all therapies with supervision PMSV Supervision: Full MD: Please consider changing trach tube to : Cuffless Follow up Recommendations: Skilled Nursing facility   Physical Therapy Ambulation/Gait assistance: Mod assist, +2 physical assistance PT Recommendation/Assessment: Patient needs continued PT services Follow Up Recommendations: LTACH PT equipment: None recommended by PT    Occupational Therapy OT Recommendation/Assessment: Patient needs continued OT Services Follow Up Recommendations: LTACH, Supervision/Assistance - 24 hour OT Equipment: 3 in 1 bedside comode    Nutritional Patient's Current Diet: Tube feeding Tube Feeding: Vital AF 1.2 Cal Tube Feeding Frequency: Continuous Tube Feeding Strength: Full strength Diet Recommendations: Dysphagia 1 (Puree), Honey-thick liquid    Case Management/Social Work Level of patient care prior to hospitalization: Home-Self care (Simultaneous filing. User may not have seen previous  data.) Living status: Alone (Simultaneous filing. User may not have seen previous data.) Insurance payer: Medicare (Simultaneous filing. User may not have seen previous data.) Barriers to progression:  (family discord- unwilling to chose facility Simultaneous filing. User may not have seen previous data.) Plan to address barriers:  (CSW to work with family on making decision) Anticipated discharge disposition: SNF/Assisted  living facility (Simultaneous filing. User may not have seen previous data.)    Provider [place] Team/Provider Recommendations [facility] Team Members Present-  Ciro Backer, RT, Alvino Blood, SW, Molli Barrows, RD, Herbie [place], SLP, Deveron Furlong, [place], NP Consider changing to cuffless trach before discharge to SNF.          Yari Szeliga, Jaci Carrel (scribe for team) 02/22/2014, 1:27 PM

## 2014-02-22 NOTE — Progress Notes (Signed)
Speech Language Pathology Treatment: Nada Boozer Speaking valve  Patient Details Name: Shainna Faux MRN: [id number] DOB: 16-Feb-1946 Today's Date: 02/22/2014 Time: 6301-6010 SLP Time Calculation (min) (ACUTE ONLY): 26 min  Assessment / Plan / Recommendation Clinical Impression  Treatment focused PMSV intervention with increased tolerance today and decreased observable or audible secretions. Able to wear valve today up to 4-5 minutes without evidence of air trapping. Slight wheeze at 5 minutes likely due to fatigue and valve doffed. She needed max reminders for deep inhalation prior to phonation and when achieved, vocal intensity fair-good.  RR 23, HR 94, SpO2 95%. Great progress towards goals and recommend pt wear PMSV with SLP, RN and PT/OT with FULL supervision when wearing. Continue ST, may be transferred soon.   HPI HPI: 69 yo developed neuro changes 12/14/15with left side weakness that resulted in a fall. Found to have right MCA clot, taken to IR for removal and revascularization procedure. 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 Functionally, these deficits manifested as left buccal residue, reduced oral manipulation and control, anterior spillage, and wet phonation and cough associated with ice chips/thin liquids. Pt advanced to dysphagia 2, thin liquids on 12/16. 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 Reintubated 12/17-12/22. Revascularization 12/17. 12/18 MRI reveals new multi focal infarcts in the posterior circulation, affecting the mesial right temporal lobe, right occipital cortex, bilateral mid and upper cerebellum and right upper pons and midbrain. FEES 12/23 with recs to begin dysphagia 1, honey-thick liquids. Pt subsequently with respiratory decline, made NPO. Repeat BSE 12/25 recommended resume Dys 1, honey liquids. SLP treatment session  with breakfast 12/29 with max cues with feeding due to impulsivity, however no evidence of aspiraiton.  with reintubation 12/23 pm.One hour following session pt developed airway plugging, decreased responsiveness was reintubated 12/29 and received trach 12/31, tolerating trach collar and PMSV ordered.   Pertinent Vitals Pain Assessment: No/denies pain  SLP Plan  Continue with current plan of care    Recommendations        Patient may use Passy-Muir Speech Valve: During all therapies with supervision PMSV Supervision: Full MD: Please consider changing trach tube to : Cuffless       Oral Care Recommendations: Oral care BID Follow up Recommendations: Skilled Nursing facility Plan: Continue with current plan of care    GO     [name] 02/22/2014, 10:21 AM   Orbie Pyo Colvin Caroli.Ed [employer] [phone]

## 2014-02-22 NOTE — Progress Notes (Signed)
No distress on ATC. Cognition much improved Secretions are much improved Trach tube was placed 7 days ago Plans for transfer to [facility] 01/08 noted  Change trach to #6 uncuffed Shiley  Would have her follow up in trach clinic in 4 weeks to consider decannulation  Scheduled: 02/04 14:00 in [facility] respiratory department  PCCM will sign off. Please call if we can be of further assistance   Merton Border, MD ; [facility] [phone].  After 5:30 PM or weekends, call [phone]

## 2014-02-22 NOTE — Discharge Summary (Signed)
Stroke Discharge Summary  Patient ID: Summer Bradley   MRN: [id number]      DOB: Jan 11, 1946  Date of Admission: 01/29/2014 Date of Discharge: 03/05/2014  Attending Physician:  No att. providers found, Stroke MD  Consulting Physician(s):   Treatment Team:  Inda Castle, MD pulmonary/intensive care, rehabilitation medicine and general surgery  Patient's PCP:  No primary care provider on file.  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   Mixed simple and mucopurulent chronic bronchitis   Paroxysmal atrial fibrillation   Acute respiratory failure   Wheezing   Acute tracheobronchitis   Atelectasis of left lung   Dysphagia, pharyngoesophageal phase   SOB (shortness of breath)   HLD (hyperlipidemia)   Essential hypertension   Thrush   COPD exacerbation   Aspiration pneumonitis   Spontaneous pneumothorax   Acute on chronic respiratory failure   Tracheostomy status   Melena   LGI bleed   Acute posthemorrhagic anemia   Benign neoplasm of colon   Diverticulosis of colon without hemorrhage   Arteriovenous malformation of jejunum   Jejunal ulcer   Gastric polyps  BMI: Body mass index is 32.69 kg/(m^2).  Past Medical History  Diagnosis Date  . Hypertension   . Stroke   . A-fib   . COPD (chronic obstructive pulmonary disease)    Past Surgical History  Procedure Laterality Date  . Abdominal surgery    . Radiology with anesthesia N/A 01/29/2014    Procedure: RADIOLOGY WITH ANESTHESIA;  Surgeon: Luanne Bras, MD;  Location: [place];  Service: Radiology;  Laterality: N/A;  . Radiology with anesthesia N/A 02/01/2014    Procedure: RADIOLOGY WITH ANESTHESIA;  Surgeon: Rob Hickman, MD;  Location: [place] [facility];  Service: Radiology;  Laterality: N/A;  . Tracheostomy  02/15/14    feinstein  . Esophagogastroduodenoscopy N/A 03/01/2014    Procedure:  ESOPHAGOGASTRODUODENOSCOPY (EGD);  Surgeon: Inda Castle, MD;  Location: [place];  Service: Endoscopy;  Laterality: N/A;  . Colonoscopy N/A 03/03/2014    Procedure: COLONOSCOPY;  Surgeon: Inda Castle, MD;  Location: [place];  Service: Endoscopy;  Laterality: N/A;  . Enteroscopy N/A 03/03/2014    Procedure: ENTEROSCOPY;  Surgeon: Inda Castle, MD;  Location: [place];  Service: Endoscopy;  Laterality: N/A;      Medication List    STOP taking these medications        aspirin EC 81 MG tablet     metoprolol 100 MG tablet  Commonly known as:  LOPRESSOR     omeprazole 20 MG capsule  Commonly known as:  PRILOSEC     verapamil 180 MG CR tablet  Commonly known as:  CALAN-SR      TAKE these medications        albuterol 108 (90 BASE) MCG/ACT inhaler  Commonly known as:  PROVENTIL HFA;VENTOLIN HFA  Inhale 1 puff into the lungs every 6 (six) hours as needed for wheezing or shortness of breath.     apixaban 2.5 MG Tabs tablet  Commonly known as:  ELIQUIS  Take 1 tablet (2.5 mg total) by mouth 2 (two) times daily.     apixaban 5 MG Tabs tablet  Commonly known as:  ELIQUIS  Take 1 tablet (5 mg total) by mouth 2 (two) times daily.  Start taking on:  03/07/2014     ATROVENT HFA 17 MCG/ACT inhaler  Generic  drug:  ipratropium  Inhale 2 puffs into the lungs 4 (four) times daily.     bisoprolol 5 MG tablet  Commonly known as:  ZEBETA  Take 0.5 tablets (2.5 mg total) by mouth 2 (two) times daily.     budesonide-formoterol 160-4.5 MCG/ACT inhaler  Commonly known as:  SYMBICORT  Inhale 2 puffs into the lungs 2 (two) times daily.     CALCIUM-VITAMIN D PO  Take 1 tablet by mouth daily.     citalopram 20 MG tablet  Commonly known as:  CELEXA  Take 20 mg by mouth at bedtime.     clonazePAM 0.5 MG tablet  Commonly known as:  KLONOPIN  Take 0.5 mg by mouth daily as needed for anxiety.     diltiazem 60 MG tablet  Commonly known as:  CARDIZEM  Take 1 tablet (60 mg  total) by mouth 4 (four) times daily.     feeding supplement (PRO-STAT SUGAR FREE 64) Liqd  Place 30 mLs into feeding tube 2 (two) times daily.     feeding supplement (VITAL AF 1.2 CAL) Liqd  Place 1,000 mLs into feeding tube continuous.     ferrous sulfate 325 (65 FE) MG tablet  Commonly known as:  FERROUSUL  Take 1 tablet (325 mg total) by mouth 2 (two) times daily with a meal.     fluticasone 50 MCG/ACT nasal spray  Commonly known as:  FLONASE  Place 1 spray into both nostrils daily as needed for allergies.     furosemide 20 MG tablet  Commonly known as:  LASIX  Take 1 tablet (20 mg total) by mouth 2 (two) times daily.     guaifenesin 100 MG/5ML syrup  Commonly known as:  ROBITUSSIN  Take 10 mLs (200 mg total) by mouth every 4 (four) hours as needed for congestion.     levalbuterol 0.63 MG/3ML nebulizer solution  Commonly known as:  XOPENEX  Take 3 mLs (0.63 mg total) by nebulization every 6 (six) hours as needed for wheezing or shortness of breath.     levothyroxine 150 MCG tablet  Commonly known as:  SYNTHROID, LEVOTHROID  Take 150 mcg by mouth daily before breakfast.     lovastatin 10 MG tablet  Commonly known as:  MEVACOR  Take 10 mg by mouth daily.     multivitamin with minerals Tabs tablet  Take 1 tablet by mouth daily.     potassium chloride SA 20 MEQ tablet  Commonly known as:  K-DUR,KLOR-CON  Take 2 tablets (40 mEq total) by mouth 2 (two) times daily.     QUEtiapine 25 MG tablet  Commonly known as:  SEROQUEL  Take 0.5 tablets (12.5 mg total) by mouth at bedtime.     sodium chloride HYPERTONIC 3 % nebulizer solution  Take 4 mLs by nebulization every 6 (six) hours.        LABORATORY STUDIES CBC    Component Value Date/Time   WBC 8.8 03/05/2014 0100   RBC 3.51* 03/05/2014 0100   HGB 8.1* 03/05/2014 0100   HCT 28.1* 03/05/2014 0100   PLT 251 03/05/2014 0100   MCV 80.1 03/05/2014 0100   MCH 23.1* 03/05/2014 0100   MCHC 28.8* 03/05/2014 0100    RDW 20.0* 03/05/2014 0100   LYMPHSABS 1.6 03/05/2014 0100   MONOABS 0.5 03/05/2014 0100   EOSABS 0.4 03/05/2014 0100   BASOSABS 0.0 03/05/2014 0100   CMP    Component Value Date/Time   NA 141 03/05/2014 0100   K 3.1*  03/05/2014 0100   CL 106 03/05/2014 0100   CO2 32 03/05/2014 0100   GLUCOSE 127* 03/05/2014 0100   BUN 13 03/05/2014 0100   CREATININE 0.54 03/05/2014 0100   CALCIUM 8.0* 03/05/2014 0100   PROT 7.0 01/29/2014 0609   ALBUMIN 3.2* 01/29/2014 0609   AST 19 01/29/2014 0609   ALT 17 01/29/2014 0609   ALKPHOS 61 01/29/2014 0609   BILITOT 1.7* 01/29/2014 0609   GFRNONAA >90 03/05/2014 0100   GFRAA >90 03/05/2014 0100   COAGS Lab Results  Component Value Date   INR 1.29 03/01/2014   INR 1.78* 02/14/2014   INR 1.30 01/30/2014   Lipid Panel    Component Value Date/Time   CHOL 114 01/30/2014 0337   TRIG 102 01/30/2014 0337   HDL 24* 01/30/2014 0337   CHOLHDL 4.8 01/30/2014 0337   VLDL 20 01/30/2014 0337   LDLCALC 70 01/30/2014 0337   HgbA1C  Lab Results  Component Value Date   HGBA1C 6.4* 01/30/2014   Cardiac Panel (last 3 results) No results for input(s): CKTOTAL, CKMB, TROPONINI, RELINDX in the last 72 hours. Urinalysis    Component Value Date/Time   COLORURINE YELLOW 02/19/2014 2336   APPEARANCEUR CLEAR 02/19/2014 2336   LABSPEC 1.016 02/19/2014 2336   PHURINE 8.0 02/19/2014 2336   GLUCOSEU NEGATIVE 02/19/2014 2336   HGBUR NEGATIVE 02/19/2014 2336   BILIRUBINUR NEGATIVE 02/19/2014 2336   KETONESUR NEGATIVE 02/19/2014 2336   PROTEINUR NEGATIVE 02/19/2014 2336   UROBILINOGEN 2.0* 02/19/2014 2336   NITRITE NEGATIVE 02/19/2014 2336   LEUKOCYTESUR NEGATIVE 02/19/2014 2336   Urine Drug Screen     Component Value Date/Time   LABOPIA NONE DETECTED 01/29/2014 0659   COCAINSCRNUR NONE DETECTED 01/29/2014 0659   LABBENZ NONE DETECTED 01/29/2014 0659   AMPHETMU NONE DETECTED 01/29/2014 0659   [place] 01/29/2014 0659   LABBARB NONE DETECTED  01/29/2014 0659    Alcohol Level    Component Value Date/Time   ETH <11 01/29/2014 0609     SIGNIFICANT DIAGNOSTIC STUDIES Ct Head Wo Contrast  02/01/2014 Stat CT scan shows no acute changes in the brain, but there appears to be a thrombus in the basilar artery that is new.  01/30/2014 1. Resolved right MCA territory hyperdensity present at 1056 hrs yesterday, most likely was post Neurointervention parenchymal contrast staining. 2. Mild cytotoxic edema suspected in that same right MCA territory. Underlying chronic ischemic changes. 3. Stable parafalcine subdural hematoma. No new intracranial hemorrhage or acute mass effect. 4. No new intracranial abnormality.  01/29/2014 Acute infarct right MCA territory as noted on CT perfusion study. There is interval development of high density in the right temporoparietal cortex which may be due to contrast material or hemorrhage. Continued follow-up recommended. Small interhemispheric subdural hematoma is unchanged.  01/29/2014 1. 4 mm thick posterior falx subdural hemorrhage. 2. Prominent density at the right M1/M2 junction. CTA is already planned. 3. Established infarcts in the anterior and posterior right MCA territory. No definitive acute infarct. 4. Left frontal scalp contusion. No acute fracture.   Ct Cerebral Perfusion W/cm 01/29/2014 1. Occlusive thrombus at the right M1-M2 junction. Subjectively good pial/pial collateral circulation. 2. Perfusion findings suggest moderate acute infarct in the central right MCA territory with rim of penumbra, as above. 3. Remote infarcts at the anterior and posterior margins of the right MCA territory. 4. Thin posterior falx subdural hemorrhage.   Cerebral Angiogram  01/29/2014 S/P Bilateral common carotid arteriograms, followed by complete revascularization of RT MCA M1 occlusion  usingX2 passes with the Solitaire FR 42mm x 58mm retrieval device And 10 mg of superselective intracranial IA  Integrelin. TICI 3 flow reesablished.   MRI brain  02/02/14 1. New, acute infarcts in the posterior circulation related to treated basilar thrombosis. Infarcts present in the bilateral cerebellum, upper right brainstem, mesial right temporal lobe, and minimally along the right occipital cortex. Normal flow voids within the intracranial vessels. 2. Stable appearance of recent right MCA territory infarct. 3. Stable trace falcine subdural hemorrhage.  01/31/14 1. Acute nonhemorrhagic infarct involving the superior right temporal lobe, right insular ribbon, right frontal operculum, and posterior right frontal lobe. 2. More remote infarcts are again noted in the more anterior inferior right frontal lobe and the right parietal lobe. 3. Extensive white matter changes bilaterally likely reflect the sequela of chronic microvascular ischemia, advanced for age. 4. White matter changes extend into the brainstem. 5. Remote tender infarcts of the cerebellum bilaterally, worse on the right.  Dg Chest Port 1 View 02/08/2014 1. Right PICC line stable position. 2. Congestive heart failure with interstitial edema and small right pleural effusion. Mild improvement from prior exam. 02/10/2014 Improved bibasilar atelectasis versus airspace disease. 02/13/2014 - No pneumothorax status post bilateral chest tube placement. Stable support apparatus 02/19/2014- Resolved left basilar pneumothorax. Bilateral hazy airspace disease not significantly changed 03/05/2014 - Findings felt to represent a degree of congestive heart failure superimposed on appearing emphysematous change. No airspace consolidation. No pneumothorax.  2D Echocardiogram EF 50-55% with no source of embolus.   Small bowel enteroscopy -  1. jejunal AVM 2. Jejunal ulcer 3. Nonbleeding gastric polyps  Colonoscopy -  1. Mild diverticulosis was noted in the sigmoid colon 2. Internal hemorrhoids 3. nonbleeding: Polyp 4. The examination  was otherwise normal no obvious colonic bleeding source  Endoscopy - Normal appearing esophagus and GE junction, the stomach was well visualized and normal in appearance, normal appearing duodenum  A1C 6.4 and LDL 70    HISTORY OF PRESENT ILLNESS Rateel Krzywicki is a 69 y.o. female who got up around 4:30 AM 01/29/2014 to go to the bathroom and then sat down in her chair. She is not certain if her left arm was working at that time or not. She then fell out of her chair around 5 AM and that time noticed that her left arm wasn't working. She did not realize that anything was wrong until she fell out of her chair. She was in her normal state of health ongoing to bed the evening prior. She was last known well at 8p on 01/28/2014. In the ER, a CT was obtained which shows evidence of a previous stroke, and a CT perfusion scan was obtained which shows a significant mismatch demonstrating an area of penumbra, though there is an smaller area of likely infarct core as well. Patient was not administered TPA secondary to being outside of the tPA window with question of subdural hematoma on CT. Due to mismatch, she was taken to neuro intervention where she had complete revascularization of RT MCA M1 occlusion using 2 passes with the Solitaire and 10 mg of IA Integrelin. TICI 3 flow reesablished. Afterwards, she was admitted to the neuro ICU for further evaluation and treatment.   HOSPITAL COURSE After admission, her anticoagulation was on hold due to larger right MCA stroke to avoid hemorrhagic transformation. 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 IR consulted again for mechanical thrombectomy and was able to extract the clot, and the patient  has sustained a right pontine and midbrain stroke, small bilateral cerebellar strokes, and some extension of the infarct in the mesial temporal area on the right. She was intubated for the procedure and had prolonged  ventilation due to difficulty off vent. Her neuro status improved quickly and eliquis was started. After extubation, she was transferred to floor. However, she still has respiratory distress and had aspiration pneumonia and again was intubated and transferred to ICU. Trach and PEG performed 02/15/14. She was stabilized and neuro stable and off the vent. CCM following pt with trach and eventually she was able to tolerate with trach collar and capping, she was off trach last week. Unfortunately, she developed melena and eliquis stopped. 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 She was s/p APC. She dose not need blood transfusion and H&H stable throughout. As per GI, her eliquis can be cautiously resumed. She was put on eliquis 2.5mg  bid today and if no evidence of GIB, will start full dose eliquis 5mg  bid in two days (03/07/14). She also developed delirium and sun downing, she was on seroquel Qhs and Xananx tid with versed PRN. On discharge, she still has 12.5mg  Qhs seroquel, but Xanax and versed discontinued and resumed her home medication of clonazepam 0.5mg  PRN . If pt still has delirium or sun-downing, may consider increase seroquel to 12.5mg  bid. Her respiratory issue much better, on nebulizations. CXR no consolidations. She was transferred to SNF in stable condition.   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 Infarcts embolic secondary to Afib with subtherapeutic INR on coumadin.  Resultant left arm weakness and left facial droop  2D Echo EF 50-55% no source of embolus  HgbA1c 6.4  eliquis po for VTE prophylaxis  aspirin 81 mg orally every day and warfarin prior to admission, changed to eliquis for stroke prevention. Resume eliquis. Due to  GIB s/p APC, resume eliquis starting with 2.5mg  bid, if in 2 days pt tolerating well and no GIB, will increased to full dose 5mg  bid.   Neurologically stable   PT/OT on board and recommend SNF  Deposition - SNF   Acute Respiratory Failure / COPD exacerbation   Intubated for neuro intervention-extubated 02/06/14  Tolerated well 24 hours post extubation.  Once to the floor 12/23, overnight developed more respiratory distress and concerning for aspiration  12/24 Transferred to step down unit for further monitoring  Developed the respiratory distress again on 02/13/2014, intubated and transferred to neuro ICU  Trach done on 02/15/14. Off vent now.  On Xopenex and 3% saline neb treatment  completed steroids tapering  Suction PRN  Chest PT  Trach tube removed and wound healing well.  Atrial Fibrillation / A flutter with AV block  Home meds: Warfarin  INR subtherapeutic on admission, INR 1.18   started Cardizem and s/p d/c amiodarone.   Rate controlled, on bisoprolol  Resume eliquis for stroke prevention - 2.5mg  bid for 2 days and then 5mg  bid if no further GIB.  Dysphagia  Aspiration pneumonia  Developed a respiratory distress again on 02/13/2014, intubated and transferred to neuro ICU. Concerning for aspiration again.   PEG done 02/15/14, still on TF  Speech following  Passed swallow now and currently on puree diet with honey thick liquid  GI Bleeding  Occult blood positive  Melena but H&H stable,  no blood tranfusion  GI involved  Colonoscopy neg  Endoscopy neg  Small bowel enteroscopy showed jejunal AVM and non-bleeding ulcer  On protonix 40mg  bid  Resume eliquis cautiously as per GI (see above)  Sun-downing  Clonazepam PRN  seroquel 12.5mg  Qhs  May consider seroquel 12.5mg  bid if worsening  Hypertension  Continue to monitor  BP stable 120-140  On bisoprolol  Hyperlipidemia  Home meds: Mevacor  LDL 70, goal  < 70  On pravastatin inpt  Resume lovastatin as outpt  Continue statin on d/c  Other Stroke Risk Factors  Advanced age  Obesity, Body mass index is 30.56 kg/(m^2).   Hx stroke/TIA  Other Active Problems  Depression, on chronic SSRI  Anemia - improved, put on iron supplement   DISCHARGE EXAM Blood pressure 103/54, pulse 153, temperature 97.8 F (36.6 C), temperature source Oral, resp. rate 23, height 5\' 2"  (1.575 m), weight 178 lb 12.7 oz (81.1 kg), SpO2 100 %. General: The patient is awake, alert and following simple commands. The patient is obese.  Respiratory: decrease breath sounds at the bilateral bases with rhonchi and wheezing Cardiovascular: irregular rate and rhythm, no murmurs noted Abdomen: Soft Skin: No significant peripheral edema is noted. Neurologic Exam Mental status: The patient is drowsy but cooperative, follows simple commands.  Cranial nerves: Pupils are equal, reactive to light. Difficulty with upgaze. Blnk to threat in all quadrants, VFF. Mild left facial asymmetry.  Motor: mild weakness left arm, left arm drift. Bilateral Hip flexion mild weakness. Otherwise intact.  Sensory examination: The patient has decreased sensation on the left arm Coordination: No obvious ataxia seen on the extremities. Difficult to test. Intact FTN, difficult to test HTS.  Gait and station: Gait was not tested. Reflexes: Deep tendon reflexes are brisk throughout.  Discharge Diet   DIET - DYS 1 Diet - low sodium heart healthy with TF  DISCHARGE PLAN  Disposition:  SNF   eliquis (apixaban) for secondary stroke prevention.  Follow-up No primary care provider on file. in 2 weeks.  Follow-up with Dr. Rosalin Hawking in 2 months.  50 minutes were spent preparing discharge.

## 2014-02-22 NOTE — Clinical Social Work Note (Addendum)
4:30pm CSW spoke with patients daughters Summer Bradley and Summer Bradley- they are agreeable to [facility] SNF with the intention to move the patient when there is a bed available at an [facility] facility.    CSW left message for [employer] to start insurance auth.  1:00pm CSW spoke with patients daughter, Summer Bradley, and informed that [facility] was looking at patient and would likely be able to take her.  Summer Bradley is agreeable to transfer to [facility] if patient is stable to go.  [facility] will take her if patients trach is changed from cuffed to uncuffed- CSW contacted Dr to help facilitate this adjustment.  10:15am CSW spoke with [name] and informed her that there are no new bed offers after expanding bed search.  Summer Bradley is agreeable to [facility] taking a look at the patient and family will tour facility.  CSW will continue to follow.  [name], [place] Social Worker [phone]

## 2014-02-23 LAB — BASIC METABOLIC PANEL
Anion gap: 12 (ref 5–15)
BUN: 53 mg/dL — ABNORMAL HIGH (ref 6–23)
CO2: 24 mmol/L (ref 19–32)
Calcium: 9.4 mg/dL (ref 8.4–10.5)
Chloride: 105 mEq/L (ref 96–112)
Creatinine, Ser: 0.97 mg/dL (ref 0.50–1.10)
GFR calc Af Amer: 68 mL/min — ABNORMAL LOW (ref >90)
GFR calc non Af Amer: 58 mL/min — ABNORMAL LOW (ref >90)
Glucose, Bld: 141 mg/dL — ABNORMAL HIGH (ref 70–99)
Potassium: 4.3 mmol/L (ref 3.5–5.1)
Sodium: 141 mmol/L (ref 135–145)

## 2014-02-23 LAB — CBC
HCT: 40.6 % (ref 36.0–46.0)
Hemoglobin: 12.1 g/dL (ref 12.0–15.0)
MCH: 23.7 pg — ABNORMAL LOW (ref 26.0–34.0)
MCHC: 29.8 g/dL — ABNORMAL LOW (ref 30.0–36.0)
MCV: 79.5 fL (ref 78.0–100.0)
Platelets: 366 10*3/uL (ref 150–400)
RBC: 5.11 MIL/uL (ref 3.87–5.11)
RDW: 21.1 % — ABNORMAL HIGH (ref 11.5–15.5)
WBC: 25.2 10*3/uL — ABNORMAL HIGH (ref 4.0–10.5)

## 2014-02-23 LAB — OCCULT BLOOD X 1 CARD TO LAB, STOOL: Fecal Occult Bld: POSITIVE — AB

## 2014-02-23 MED ORDER — QUETIAPINE 12.5 MG HALF TABLET
12.5000 mg | ORAL_TABLET | Freq: Every day | ORAL | Status: DC
  Administered 2014-02-23 – 2014-03-04 (×10): 12.5 mg via ORAL
  Filled 2014-02-23 (×11): qty 1

## 2014-02-23 MED ORDER — RESOURCE THICKENUP CLEAR PO POWD: ORAL | Status: DC | PRN

## 2014-02-23 NOTE — Progress Notes (Signed)
Speech Language Pathology Treatment: Dysphagia;Cognitive-Linquistic;Passy Muir Speaking valve  Patient Details Name: Summer Bradley MRN: [id number] DOB: August 31, 1945 Today's Date: 02/23/2014 Time: 1030-1100 SLP Time Calculation (min) (ACUTE ONLY): 30 min  Assessment / Plan / Recommendation Clinical Impression  Pt demonstrates significant improvement following trach change to 6 cuffless. Pts vocal quality quite clear with improved breath support. Pt able to wear valve for 30 minute interval with no change in O2 saturation or trapping observed. PT reports pt trapping yesterday during activity prior to trach change. Hopeful that tolerance of PMSV during therapy will have improved now. Pt continues to need min verbal cue to verbalize wants and needs rather than use gestures. SLP also provided moderate cueing for orientation and basic functional tasks. Instructed dtr [name] in placement and removal of valve, providing full supervision and other PMSV precautions. Pt may wear PMSV will full caregiver supervision. SLP will f/u today at 1345 to complete FEES and check for PMSV tolerance. Pt was very successful with trials of puree and ice chips indicating readiness for retesting prior to d/c.    HPI HPI: 69 yo developed neuro changes 12/14/15with left side weakness that resulted in a fall. Found to have right MCA clot, taken to IR for removal and revascularization procedure. 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 Functionally, these deficits manifested as left buccal residue, reduced oral manipulation and control, anterior spillage, and wet phonation and cough associated with ice chips/thin liquids. Pt advanced to dysphagia 2, thin liquids on 12/16. 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 Reintubated 12/17-12/22. Revascularization 12/17. 12/18 MRI reveals new multi focal  infarcts in the posterior circulation, affecting the mesial right temporal lobe, right occipital cortex, bilateral mid and upper cerebellum and right upper pons and midbrain. FEES 12/23 with recs to begin dysphagia 1, honey-thick liquids. Pt subsequently with respiratory decline, made NPO. Repeat BSE 12/25 recommended resume Dys 1, honey liquids. SLP treatment session with breakfast 12/29 with max cues with feeding due to impulsivity, however no evidence of aspiraiton.  with reintubation 12/23 pm.One hour following session pt developed airway plugging, decreased responsiveness was reintubated 12/29 and received trach 12/31, tolerating trach collar and PMSV ordered.   Pertinent Vitals    SLP Plan  Continue with current plan of care    Recommendations Diet recommendations: NPO      Patient may use Passy-Muir Speech Valve: During all waking hours (remove during sleep);Caregiver trained to provide supervision (full supervision) PMSV Supervision: Full       Oral Care Recommendations: Oral care BID Follow up Recommendations: Skilled Nursing facility Plan: Continue with current plan of care    GO    [facility], MA CCC-SLP [phone]  Summer Bradley 02/23/2014, 11:03 AM

## 2014-02-23 NOTE — Progress Notes (Signed)
Patient had a rusty red/brown stool.  Notified Dr Doy Mince, will collect hemoccult and continue to monitor.  Hold am dose Eliquis until further instruction per Dr. Doy Mince; will notify morning nurse.

## 2014-02-23 NOTE — Clinical Social Work Note (Addendum)
4:30pm CSW spoke with [facility]- still no insurance auth.    2:30pm CSW informed patients daughter, Summer Bradley, that we are waiting on insurance and if we do not have authorization today then LOG bed will be the option.  Patients daughter is not agreeable to plan and is only interested in transferring patient to [facility] in [place].     2:00pm CSW spoke with [facility] in [place]- they can extend bed offer to patient pending insurance authorization.  If patient does not receive insurance authorization today she is unable to transfer to [facility] until Monday.  CSW faxed patient information to [facility] for LOG bed- bed offer pending.  10:15am CSW spoke with [facility] in [place]- looking at referral currently- CSW updated [name].  Dr was informed that patient had sitter last night- SNF unable to admit until patient has been without sitter for 24 hours  8:30am CSW received call from patients daughter, Summer Bradley, who stated that she found [facility] facility in [place] and would like patient to go there instead of previous choice of [facility].  CSW called to speak with admissions [phone]) and will fax referral.  [name] now refusing for patient to be sent to [facility] or any other [facility] bed offers.  CSW will continue to follow.  [name], [place] Social Worker [phone]

## 2014-02-23 NOTE — Progress Notes (Signed)
STROKE TEAM PROGRESS NOTE   SUBJECTIVE (INTERVAL HISTORY):  Son is at bedside. Pt is on PEG and trach. Still has dry mouth. Less secretion as before. Drowsy in am but follows all commands. On TF. Discussed with the social worker regarding Friday transfer to nursing home. Contacted CCM to change trach to uncuffed trach for preparation nursing home transfer.   OBJECTIVE Temp:  [97.2 F (36.2 C)-98 F (36.7 C)] 97.6 F (36.4 C) (01/08 0834) Pulse Rate:  [84-108] 108 (01/08 0834) Cardiac Rhythm:  [-] Atrial flutter;Atrial fibrillation (01/08 0706) Resp:  [22-30] 25 (01/08 0834) BP: (97-128)/(52-85) 107/64 mmHg (01/08 0834) SpO2:  [91 %-99 %] 96 % (01/08 0834) FiO2 (%):  [28 %] 28 % (01/08 1000) Weight:  [167 lb 1.7 oz (75.8 kg)] 167 lb 1.7 oz (75.8 kg) (01/08 0355)   Recent Labs Lab 02/21/14 1220 02/21/14 1632 02/22/14 0759 02/22/14 1224 02/22/14 1608  GLUCAP 167* 149* 126* 155* 122*    Recent Labs Lab 02/18/14 0510 02/19/14 0500 02/20/14 0618 02/21/14 0422 02/23/14 0420  NA 143 141 141 140 141  K 3.5 3.6 3.3* 3.8 4.3  CL 103 103 100 101 105  CO2 37* 31 31 32 24  GLUCOSE 128* 144* 107* 119* 141*  BUN 23 19 22  27* 53*  CREATININE 0.49* 0.45* 0.54 0.58 0.97  CALCIUM 8.6 8.6 8.3* 9.0 9.4  MG 2.1 2.0 1.7 2.1  --   PHOS 2.2* 2.8 2.9 3.2  --    No results for input(s): AST, ALT, ALKPHOS, BILITOT, PROT, ALBUMIN in the last 168 hours.  Recent Labs Lab 02/18/14 0510 02/19/14 0500 02/20/14 0618 02/20/14 0706 02/21/14 0422 02/23/14 0420  WBC 20.5* 21.3* TEST REQUEST RECEIVED WITHOUT APPROPRIATE SPECIMEN 24.1* 18.5* 25.2*  NEUTROABS 17.5* 18.8* PENDING 21.3* 16.0*  --   HGB 9.2* 9.5* TEST REQUEST RECEIVED WITHOUT APPROPRIATE SPECIMEN 9.6* 10.9* 12.1  HCT 32.8* 31.6* TEST REQUEST RECEIVED WITHOUT APPROPRIATE SPECIMEN 32.5* 36.1 40.6  MCV 81.8 80.8 TEST REQUEST RECEIVED WITHOUT APPROPRIATE SPECIMEN 79.7 81.7 79.5  PLT 435* 341 TEST REQUEST RECEIVED WITHOUT APPROPRIATE  SPECIMEN 377 379 366   No results for input(s): CKTOTAL, CKMB, CKMBINDEX, TROPONINI in the last 168 hours. No results for input(s): LABPROT, INR in the last 72 hours. No results for input(s): COLORURINE, LABSPEC, [place], GLUCOSEU, HGBUR, BILIRUBINUR, KETONESUR, PROTEINUR, UROBILINOGEN, NITRITE, LEUKOCYTESUR in the last 72 hours.  Invalid input(s): APPERANCEUR     Component Value Date/Time   CHOL 114 01/30/2014 0337   TRIG 102 01/30/2014 0337   HDL 24* 01/30/2014 0337   CHOLHDL 4.8 01/30/2014 0337   VLDL 20 01/30/2014 0337   LDLCALC 70 01/30/2014 0337   Lab Results  Component Value Date   HGBA1C 6.4* 01/30/2014      Component Value Date/Time   LABOPIA NONE DETECTED 01/29/2014 0659   COCAINSCRNUR NONE DETECTED 01/29/2014 0659   LABBENZ NONE DETECTED 01/29/2014 0659   AMPHETMU NONE DETECTED 01/29/2014 0659   THCU NONE DETECTED 01/29/2014 0659   LABBARB NONE DETECTED 01/29/2014 0659    No results for input(s): ETH in the last 168 hours.  I have personally reviewed the radiological images below and agree with the radiology interpretations.  Ct Head Wo Contrast  02/01/2014  Stat CT scan shows no acute changes in the brain, but there appears to be a thrombus in the basilar artery that is new.  01/30/2014    1. Resolved right MCA territory hyperdensity present at 1056 hrs yesterday, most likely was post Neurointervention parenchymal  contrast staining. 2. Mild cytotoxic edema suspected in that same right MCA territory. Underlying chronic ischemic changes. 3. Stable parafalcine subdural hematoma. No new intracranial hemorrhage or acute mass effect. 4. No new intracranial abnormality.    01/29/2014    Acute infarct right MCA territory as noted on CT perfusion study. There is interval development of high density in the right temporoparietal cortex which may be due to contrast material or hemorrhage. Continued follow-up recommended.  Small interhemispheric subdural hematoma is unchanged.     01/29/2014   1. 4 mm thick posterior falx subdural hemorrhage. 2. Prominent density at the right M1/M2 junction. CTA is already planned. 3. Established infarcts in the anterior and posterior right MCA territory. No definitive acute infarct. 4. Left frontal scalp contusion.  No acute fracture.    Ct Cerebral Perfusion W/cm 01/29/2014    1. Occlusive thrombus at the right M1-M2 junction. Subjectively good pial/pial collateral circulation. 2. Perfusion findings suggest moderate acute infarct in the central right MCA territory with rim of penumbra, as above. 3. Remote infarcts at the anterior and posterior margins of the right MCA territory. 4. Thin posterior falx subdural hemorrhage.     Cerebral Angiogram  01/29/2014 S/P Bilateral common carotid arteriograms, followed by complete revascularization of RT MCA M1 occlusion usingX2 passes with the Solitaire FR 58mm x 66mm retrieval device And 10 mg of  superselective intracranial IA Integrelin. TICI 3 flow reesablished.   MRI brain  02/02/14 1. New, acute infarcts in the posterior circulation related to treated basilar thrombosis. Infarcts present in the bilateral cerebellum, upper right brainstem, mesial right temporal lobe, and minimally along the right occipital cortex. Normal flow voids within the intracranial vessels. 2. Stable appearance of recent right MCA territory infarct. 3. Stable trace falcine subdural hemorrhage.  01/31/14 1. Acute nonhemorrhagic infarct involving the superior right temporal lobe, right insular ribbon, right frontal operculum, and posterior right frontal lobe. 2. More remote infarcts are again noted in the more anterior inferior right frontal lobe and the right parietal lobe. 3. Extensive white matter changes bilaterally likely reflect the sequela of chronic microvascular ischemia, advanced for age. 4. White matter changes extend into the brainstem. 5. Remote tender infarcts of the cerebellum bilaterally, worse on  the right.  Dg Chest Port 1 View 02/08/2014    1. Right PICC line stable position. 2. Congestive heart failure with interstitial edema and small right pleural effusion. Mild improvement from prior exam. 02/10/2014 Improved bibasilar atelectasis versus airspace disease. 02/13/2014 - No pneumothorax status post bilateral chest tube placement. Stable support apparatus 02/19/2014- Resolved left basilar pneumothorax. Bilateral hazy airspace disease not significantly changed  2D Echocardiogram  EF 50-55% with no source of embolus.   A1C 6.4 and LDL 70   Physical Exam General: The patient is awake, alert and following simple commands. The patient is obese.   Respiratory: decrease breath sounds at the bilateral bases with rhonchi and wheezing Cardiovascular: irregular rate and rhythm, no murmurs noted Abdomen: Soft Skin: No significant peripheral edema is noted. Neurologic Exam Mental status: The patient is cooperative, follows simple commands.  Cranial nerves: Pupils are equal, reactive to light. Difficulty with upgaze. Blnk to threat in all quadrants, VFF. Mild left facial asymmetry.  Motor: mild weakness left arm, left arm drift. Bilateral Hip flexion mild weakness. Otherwise intact.  Sensory examination: The patient has decreased sensation on the left arm Coordination: No obvious ataxia seen on the extremities. Difficult to test. Intact FTN, difficult to test HTS.  Gait and  station: Gait was not tested. Reflexes: Deep tendon reflexes are brisk throughout.  ASSESSMENT/PLAN Ms. Summer Bradley is a 69 y.o. female with history of hypertension, stroke, afib and COPD presenting with Left-sided weakness. She did not receive IV t-PA due to delay in arrival, ? SDH. But underwent mechanical thrombectomy of right MCA with complete vascularization of right MCA M1 occlusion with IA Integrilin. 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 Dr. Estanislado Pandy was able  to extract the clot, and the patient has sustained a right pontine and midbrain stroke, small bilateral cerebellar strokes, and some extension of the infarct in the mesial temporal area on the right.   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 Infarcts embolic secondary to Afib with subtherapeutic INR on coumadin.  Resultant left arm weakness and left facial droop  2D Echo  EF 50-55% no source of embolus  HgbA1c 6.4  eliquis po for VTE prophylaxis  aspirin 81 mg orally every day and warfarin prior to admission, changed to eliquis for stroke prevention. Resume eliquis.   Neurologically stable   PT/OT on board and recommend SNF  Deposition - SNF tomorrow after off sitter for 24 hours.    Acute Respiratory Failure / COPD exacerbation   Intubated for neuro intervention-extubated 02/06/14  Tolerated well 24 hours post extubation.  Once to the floor 12/23, overnight developed more respiratory distress and concerning for aspiration  12/24 Transferred to step down unit for further monitoring  Developed the respiratory distress again on 02/13/2014, intubated and transferred to neuro ICU  Trach done on 02/15/14. Off vent now.  LTAC placement  unasyn discontinued  On Xopenex and mucomyst neb treatment  On steroids tapering  Suction PRN  Atrial Fibrillation / A flutter with AV block  Home meds:  Warfarin  INR subtherapeutic on admission, INR 1.18   started Cardizem and s/p d/c amiodarone.   Rate controlled.  Resume eliquis for stroke prevention  Dysphagia  Aspiration pneumonia  Developed a respiratory distress again on 02/13/2014, intubated and transferred to neuro ICU. Concerning for aspiration again.   PEG done 02/15/14. TF  restarted  Speech following  Positive stool occult blood test   No black stool   No frank blood in stool  Hb improved than before  Positive test likely due to her recent PEG procedure  Will resume eliquis  Close monitoring  Sun-downing  d/c sitter  d/c restraint  tapered off clonazepam  low dose seroquel   Hypertension  Continue to monitor  BP stable 120-140  On bisoprolol  Hyperlipidemia  Home meds:  Mevacor  LDL 70, goal < 70  On pravastatin  Continue statin on d/c  Trush  Fluconazole for 14 days  Started on 02/10/2014  Other Stroke Risk Factors  Advanced age  Obesity, Body mass index is 30.56 kg/(m^2).   Hx stroke/TIA  Other Active Problems  Depression, on chronic SSRI  Anemia - improved  Leukocytosis - on solumedrol - afebrile 18.2->20.5->21.3->24.1->18.5  Hospital day # 25   Rosalin Hawking, MD PhD Stroke Neurology 02/23/2014 11:48 AM    To contact Stroke Continuity provider, please refer to [email or web address] After hours, contact General Neurology

## 2014-02-23 NOTE — Progress Notes (Signed)
Speech Language Pathology Treatment: Dysphagia  Patient Details Name: Summer Bradley MRN: [id number] DOB: 16-Jun-1945 Today's Date: 02/23/2014 Time: 1400-1420 SLP Time Calculation (min) (ACUTE ONLY): 20 min  Assessment / Plan / Recommendation Clinical Impression  Unable to complete FEES this afternoon secondary to equipment malfunction; therefore, diagnostic PO trials were provided by SLP with PMSV in place. No overt signs of intolerance observed with speaking valve. Pt consumed honey-thick liquids and pureed solids, both with green dye, with seemingly timely oral transit. No overt signs of aspiration were observed, including no evidence of green dye at the trach hub after coughing and after delay. Recommend to proceed with Dys 1 diet and honey thick liquids with full supervision as pt requires Max multimodal cueing for impulsivity.   HPI HPI: 69 yo developed neuro changes 12/14/15with left side weakness that resulted in a fall. Found to have right MCA clot, taken to IR for removal and revascularization procedure. 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 Functionally, these deficits manifested as left buccal residue, reduced oral manipulation and control, anterior spillage, and wet phonation and cough associated with ice chips/thin liquids. Pt advanced to dysphagia 2, thin liquids on 12/16. 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 Reintubated 12/17-12/22. Revascularization 12/17. 12/18 MRI reveals new multi focal infarcts in the posterior circulation, affecting the mesial right temporal lobe, right occipital cortex, bilateral mid and upper cerebellum and right upper pons and midbrain. FEES 12/23 with recs to begin dysphagia 1, honey-thick liquids. Pt subsequently with respiratory decline, made NPO. Repeat BSE 12/25 recommended resume Dys 1, honey liquids. SLP  treatment session with breakfast 12/29 with max cues with feeding due to impulsivity, however no evidence of aspiraiton.  with reintubation 12/23 pm.One hour following session pt developed airway plugging, decreased responsiveness was reintubated 12/29 and received trach 12/31, tolerating trach collar and PMSV ordered.   Pertinent Vitals Pain Assessment: No/denies pain  SLP Plan  Goals updated    Recommendations Diet recommendations: Dysphagia 1 (puree);Honey-thick liquid Liquids provided via: Cup;No straw Medication Administration: Crushed with puree Supervision: Patient able to self feed;Full supervision/cueing for compensatory strategies;Staff to assist with self feeding Compensations: Slow rate;Small sips/bites;Check for pocketing;Check for anterior loss Postural Changes and/or Swallow Maneuvers: Seated upright 90 degrees      Patient may use Passy-Muir Speech Valve: During all waking hours (remove during sleep);Caregiver trained to provide supervision PMSV Supervision: Full       Oral Care Recommendations: Oral care BID Follow up Recommendations: Skilled Nursing facility Plan: Goals updated    GO      Germain Osgood, M.A. CCC-SLP [phone]  Germain Osgood 02/23/2014, 2:29 PM

## 2014-02-24 DIAGNOSIS — K922 Gastrointestinal hemorrhage, unspecified: Secondary | ICD-10-CM

## 2014-02-24 LAB — BASIC METABOLIC PANEL
Anion gap: 7 (ref 5–15)
BUN: 68 mg/dL — ABNORMAL HIGH (ref 6–23)
CO2: 28 mmol/L (ref 19–32)
Calcium: 9.6 mg/dL (ref 8.4–10.5)
Chloride: 108 mEq/L (ref 96–112)
Creatinine, Ser: 1.07 mg/dL (ref 0.50–1.10)
GFR calc Af Amer: 60 mL/min — ABNORMAL LOW (ref >90)
GFR calc non Af Amer: 52 mL/min — ABNORMAL LOW (ref >90)
Glucose, Bld: 165 mg/dL — ABNORMAL HIGH (ref 70–99)
Potassium: 4.6 mmol/L (ref 3.5–5.1)
Sodium: 143 mmol/L (ref 135–145)

## 2014-02-24 LAB — CBC
HCT: 41.6 % (ref 36.0–46.0)
Hemoglobin: 12.3 g/dL (ref 12.0–15.0)
MCH: 23.7 pg — ABNORMAL LOW (ref 26.0–34.0)
MCHC: 29.6 g/dL — ABNORMAL LOW (ref 30.0–36.0)
MCV: 80.3 fL (ref 78.0–100.0)
Platelets: 369 10*3/uL (ref 150–400)
RBC: 5.18 MIL/uL — ABNORMAL HIGH (ref 3.87–5.11)
RDW: 21.4 % — ABNORMAL HIGH (ref 11.5–15.5)
WBC: 27 10*3/uL — ABNORMAL HIGH (ref 4.0–10.5)

## 2014-02-24 NOTE — Progress Notes (Signed)
[place] for Apixaban Indication: atrial fibrillation  Allergies  Allergen Reactions  . Lisinopril     Unknown reaction    Patient Measurements: Height: 5\' 2"  (157.5 cm) Weight: 171 lb 4.8 oz (77.7 kg) IBW/kg (Calculated) : 50.1  Vital Signs: Temp: 97.6 F (36.4 C) (01/09 0832) Temp Source: Axillary (01/09 0832) BP: 133/83 mmHg (01/09 1152) Pulse Rate: 136 (01/09 1157)  Labs:  Recent Labs  02/23/14 0420 02/24/14 0530  HGB 12.1 12.3  HCT 40.6 41.6  PLT 366 369  CREATININE 0.97 1.07    Estimated Creatinine Clearance: 47.9 mL/min (by C-G formula based on Cr of 1.07).   Medical History: Past Medical History  Diagnosis Date  . Hypertension   . Stroke   . A-fib   . COPD (chronic obstructive pulmonary disease)     Medications:  Scheduled:  . antiseptic oral rinse  7 mL Mouth Rinse QID  . apixaban  5 mg Oral BID  . bisoprolol  2.5 mg Oral BID  . chlorhexidine  15 mL Mouth Rinse BID  . citalopram  20 mg Per Tube QHS  . diltiazem  60 mg Oral 4 times per day  . feeding supplement (PRO-STAT SUGAR FREE 64)  30 mL Per Tube BID  . fluconazole  100 mg Oral QPC supper  . furosemide  40 mg Intravenous Q12H  . levalbuterol  0.63 mg Nebulization Q6H  . levothyroxine  150 mcg Per Tube QAC breakfast  . pantoprazole sodium  40 mg Per Tube Daily  . potassium chloride  40 mEq Per Tube TID  . pravastatin  20 mg Per Tube q1800  . predniSONE  10 mg Oral Q breakfast   Followed by  . [START ON 02/28/2014] predniSONE  5 mg Oral Q breakfast  . QUEtiapine  12.5 mg Oral QHS  . sodium chloride  10-40 mL Intracatheter Q12H    Assessment: 69 y.o. Female continuing on apixaban for Afib. Apixaban resumed 02/16/14.  CBC stable wnl. No bleeding noted. SCr stable wnl. No bleeding documented.  Goal of Therapy:  Prevention of stroke Monitor platelets by anticoagulation protocol: Yes   Plan:  -Continue Apixaban 5mg  po BID -CBC q72h -Will f/u  renal function, s/s bleeding -Discharge education completed.   Summer Bradley, PharmD Clinical Pharmacist - Resident Pager [phone] 02/24/2014 2:22 PM

## 2014-02-24 NOTE — Progress Notes (Signed)
STROKE TEAM PROGRESS NOTE   SUBJECTIVE (INTERVAL HISTORY):  No family member is at bedside. Pt is on PEG and trach. Awake and interactive. On TF. Discussed with the social worker regarding  transfer to nursing home. Contacted CCM to change trach to uncuffed trach for preparation nursing home transfer. WBC going up but afebrile-likely post procedure  OBJECTIVE Temp:  [97 F (36.1 C)-98.2 F (36.8 C)] 97.6 F (36.4 C) (01/09 0832) Pulse Rate:  [48-114] 68 (01/09 0832) Cardiac Rhythm:  [-] Atrial flutter (01/09 0754) Resp:  [19-32] 19 (01/09 0832) BP: (97-138)/(58-96) 111/68 mmHg (01/09 0832) SpO2:  [90 %-100 %] 98 % (01/09 0832) FiO2 (%):  [28 %] 28 % (01/09 0757) Weight:  [171 lb 4.8 oz (77.7 kg)] 171 lb 4.8 oz (77.7 kg) (01/09 0427)   Recent Labs Lab 02/21/14 1220 02/21/14 1632 02/22/14 0759 02/22/14 1224 02/22/14 1608  GLUCAP 167* 149* 126* 155* 122*    Recent Labs Lab 02/18/14 0510 02/19/14 0500 02/20/14 0618 02/21/14 0422 02/23/14 0420 02/24/14 0530  NA 143 141 141 140 141 143  K 3.5 3.6 3.3* 3.8 4.3 4.6  CL 103 103 100 101 105 108  CO2 37* 31 31 32 24 28  GLUCOSE 128* 144* 107* 119* 141* 165*  BUN 23 19 22  27* 53* 68*  CREATININE 0.49* 0.45* 0.54 0.58 0.97 1.07  CALCIUM 8.6 8.6 8.3* 9.0 9.4 9.6  MG 2.1 2.0 1.7 2.1  --   --   PHOS 2.2* 2.8 2.9 3.2  --   --    No results for input(s): AST, ALT, ALKPHOS, BILITOT, PROT, ALBUMIN in the last 168 hours.  Recent Labs Lab 02/18/14 0510 02/19/14 0500 02/20/14 0618 02/20/14 0706 02/21/14 0422 02/23/14 0420 02/24/14 0530  WBC 20.5* 21.3* TEST REQUEST RECEIVED WITHOUT APPROPRIATE SPECIMEN 24.1* 18.5* 25.2* 27.0*  NEUTROABS 17.5* 18.8* PENDING 21.3* 16.0*  --   --   HGB 9.2* 9.5* TEST REQUEST RECEIVED WITHOUT APPROPRIATE SPECIMEN 9.6* 10.9* 12.1 12.3  HCT 32.8* 31.6* TEST REQUEST RECEIVED WITHOUT APPROPRIATE SPECIMEN 32.5* 36.1 40.6 41.6  MCV 81.8 80.8 TEST REQUEST RECEIVED WITHOUT APPROPRIATE SPECIMEN 79.7 81.7  79.5 80.3  PLT 435* 341 TEST REQUEST RECEIVED WITHOUT APPROPRIATE SPECIMEN 377 379 366 369   No results for input(s): CKTOTAL, CKMB, CKMBINDEX, TROPONINI in the last 168 hours. No results for input(s): LABPROT, INR in the last 72 hours. No results for input(s): COLORURINE, LABSPEC, [place], GLUCOSEU, HGBUR, BILIRUBINUR, KETONESUR, PROTEINUR, UROBILINOGEN, NITRITE, LEUKOCYTESUR in the last 72 hours.  Invalid input(s): APPERANCEUR     Component Value Date/Time   CHOL 114 01/30/2014 0337   TRIG 102 01/30/2014 0337   HDL 24* 01/30/2014 0337   CHOLHDL 4.8 01/30/2014 0337   VLDL 20 01/30/2014 0337   LDLCALC 70 01/30/2014 0337   Lab Results  Component Value Date   HGBA1C 6.4* 01/30/2014      Component Value Date/Time   LABOPIA NONE DETECTED 01/29/2014 0659   COCAINSCRNUR NONE DETECTED 01/29/2014 0659   LABBENZ NONE DETECTED 01/29/2014 0659   AMPHETMU NONE DETECTED 01/29/2014 0659   THCU NONE DETECTED 01/29/2014 0659   LABBARB NONE DETECTED 01/29/2014 0659    No results for input(s): ETH in the last 168 hours.  I have personally reviewed the radiological images below and agree with the radiology interpretations.  Ct Head Wo Contrast  02/01/2014  Stat CT scan shows no acute changes in the brain, but there appears to be a thrombus in the basilar artery that is new.  01/30/2014    1. Resolved right MCA territory hyperdensity present at 1056 hrs yesterday, most likely was post Neurointervention parenchymal contrast staining. 2. Mild cytotoxic edema suspected in that same right MCA territory. Underlying chronic ischemic changes. 3. Stable parafalcine subdural hematoma. No new intracranial hemorrhage or acute mass effect. 4. No new intracranial abnormality.    01/29/2014    Acute infarct right MCA territory as noted on CT perfusion study. There is interval development of high density in the right temporoparietal cortex which may be due to contrast material or hemorrhage. Continued follow-up  recommended.  Small interhemispheric subdural hematoma is unchanged.    01/29/2014   1. 4 mm thick posterior falx subdural hemorrhage. 2. Prominent density at the right M1/M2 junction. CTA is already planned. 3. Established infarcts in the anterior and posterior right MCA territory. No definitive acute infarct. 4. Left frontal scalp contusion.  No acute fracture.    Ct Cerebral Perfusion W/cm 01/29/2014    1. Occlusive thrombus at the right M1-M2 junction. Subjectively good pial/pial collateral circulation. 2. Perfusion findings suggest moderate acute infarct in the central right MCA territory with rim of penumbra, as above. 3. Remote infarcts at the anterior and posterior margins of the right MCA territory. 4. Thin posterior falx subdural hemorrhage.     Cerebral Angiogram  01/29/2014 S/P Bilateral common carotid arteriograms, followed by complete revascularization of RT MCA M1 occlusion usingX2 passes with the Solitaire FR 56mm x 22mm retrieval device And 10 mg of  superselective intracranial IA Integrelin. TICI 3 flow reesablished.   MRI brain  02/02/14 1. New, acute infarcts in the posterior circulation related to treated basilar thrombosis. Infarcts present in the bilateral cerebellum, upper right brainstem, mesial right temporal lobe, and minimally along the right occipital cortex. Normal flow voids within the intracranial vessels. 2. Stable appearance of recent right MCA territory infarct. 3. Stable trace falcine subdural hemorrhage.  01/31/14 1. Acute nonhemorrhagic infarct involving the superior right temporal lobe, right insular ribbon, right frontal operculum, and posterior right frontal lobe. 2. More remote infarcts are again noted in the more anterior inferior right frontal lobe and the right parietal lobe. 3. Extensive white matter changes bilaterally likely reflect the sequela of chronic microvascular ischemia, advanced for age. 4. White matter changes extend into the  brainstem. 5. Remote tender infarcts of the cerebellum bilaterally, worse on the right.  Dg Chest Port 1 View 02/08/2014    1. Right PICC line stable position. 2. Congestive heart failure with interstitial edema and small right pleural effusion. Mild improvement from prior exam. 02/10/2014 Improved bibasilar atelectasis versus airspace disease. 02/13/2014 - No pneumothorax status post bilateral chest tube placement. Stable support apparatus 02/19/2014- Resolved left basilar pneumothorax. Bilateral hazy airspace disease not significantly changed  2D Echocardiogram  EF 50-55% with no source of embolus.   A1C 6.4 and LDL 70   Physical Exam General: The patient is awake, alert and following simple commands. S/p trach and peg.The patient is obese.   Respiratory: decrease breath sounds at the bilateral bases with rhonchi and wheezing Cardiovascular: irregular rate and rhythm, no murmurs noted Abdomen: Soft Skin: No significant peripheral edema is noted. Neurologic Exam Mental status: The patient is awake alert cooperative, follows simple commands.  Cranial nerves: Pupils are equal, reactive to light. Difficulty with upgaze. Blnk to threat in all quadrants, VFF. Mild left facial asymmetry. horase voice but can be understood. Motor: mild weakness left arm, left arm drift. Bilateral Hip flexion mild weakness. Otherwise intact.  Sensory examination: The patient has decreased sensation on the left arm Coordination: No obvious ataxia seen on the extremities. Difficult to test. Intact FTN, difficult to test HTS.  Gait and station: Gait was not tested. Reflexes: Deep tendon reflexes are brisk throughout.  ASSESSMENT/PLAN Ms. Summer Bradley is a 69 y.o. female with history of hypertension, stroke, afib and COPD presenting with Left-sided weakness. She did not receive IV t-PA due to delay in arrival and ? SDH. But underwent mechanical thrombectomy of right MCA with complete vascularization of right MCA  M1 occlusion with IA Integrilin. 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 Dr. Estanislado Pandy was able to extract the clot, and the patient has sustained a right pontine and midbrain stroke, small bilateral cerebellar strokes, and some extension of the infarct in the mesial temporal area on the right. S/p trach and peg   Stroke:  R MCA infarct secondary to R MCA occlusion s/p revascularization with mechanical thrombectomy and IA integrelin. 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 Infarcts embolic secondary to Afib with subtherapeutic INR on coumadin.  Resultant left arm weakness and left facial droop  2D Echo  EF 50-55% no source of embolus  HgbA1c 6.4  eliquis po for VTE prophylaxis  aspirin 81 mg orally every day and warfarin prior to admission, changed to eliquis for stroke prevention. Resume eliquis.   Neurologically stable   PT/OT on board and recommend SNF  Deposition - SNF tomorrow after off sitter for 24 hours.    Acute Respiratory Failure / COPD exacerbation   Intubated for neuro intervention-extubated 02/06/14  Tolerated well 24 hours post extubation.  Once to the floor 12/23, overnight developed more respiratory distress and concerning for aspiration  12/24 Transferred to step down unit for further monitoring  Developed the respiratory distress again on 02/13/2014, intubated and transferred to neuro ICU  Trach done on 02/15/14. Off vent now.  LTAC placement  unasyn discontinued  On Xopenex and mucomyst neb treatment  On steroids tapering  Suction PRN  Atrial Fibrillation / A flutter with AV block  Home meds:  Warfarin  INR subtherapeutic on admission, INR 1.18   started Cardizem and s/p d/c amiodarone.   Rate controlled.  On eliquis for stroke prevention  Dysphagia  Aspiration  pneumonia  Developed a respiratory distress again on 02/13/2014, intubated and transferred to neuro ICU. Concerning for aspiration again.   PEG done 02/15/14. TF restarted  Speech following  Positive stool occult blood test   No black stool   No frank blood in stool  Hb improved than before  Positive test likely due to her recent PEG procedure  Will resume eliquis  Close monitoring  Sun-downing  d/c sitter  d/c restraint  tapered off clonazepam  low dose seroquel   Hypertension  Continue to monitor  BP stable 120-140  On bisoprolol  Hyperlipidemia  Home meds:  Mevacor  LDL 70, goal < 70  On pravastatin  Continue statin on d/c  Trush  Fluconazole for 14 days  Started on 02/10/2014  Other Stroke Risk Factors  Advanced age  Obesity, Body mass index is 31.32 kg/(m^2).   Hx stroke/TIA  Other Active Problems  Depression, on chronic SSRI  Anemia - improved  Leukocytosis - on solumedrol - afebrile 18.2->20.5->21.3->24.1->18.5  Hospital day # 85   Antony Contras, MD Stroke Neurology 02/24/2014 11:22 AM    To contact Stroke Continuity provider, please refer  to [email or web address] After hours, contact General Neurology

## 2014-02-24 NOTE — Progress Notes (Signed)
Patient's family has been more receptive with the staff in the past 3 days.  They are more appreciative with the care.  Patient is more alert.  She has been shaking the bed rails intermittently and when staff goes to her room and states that she needed someone to talk to.  Informed patient that at this time, everyone is busy.  Patient needs to be closely supervised during meal time.  She has a tendency to drink too fast.  We have been feeding her every mealtime.

## 2014-02-25 LAB — CBC
HCT: 39.7 % (ref 36.0–46.0)
Hemoglobin: 11.9 g/dL — ABNORMAL LOW (ref 12.0–15.0)
MCH: 24 pg — ABNORMAL LOW (ref 26.0–34.0)
MCHC: 30 g/dL (ref 30.0–36.0)
MCV: 80 fL (ref 78.0–100.0)
Platelets: 339 10*3/uL (ref 150–400)
RBC: 4.96 MIL/uL (ref 3.87–5.11)
RDW: 21.4 % — ABNORMAL HIGH (ref 11.5–15.5)
WBC: 30.7 10*3/uL — ABNORMAL HIGH (ref 4.0–10.5)

## 2014-02-25 MED ORDER — IPRATROPIUM BROMIDE 0.02 % IN SOLN
0.5000 mg | Freq: Three times a day (TID) | RESPIRATORY_TRACT | Status: DC
  Administered 2014-02-26 – 2014-03-02 (×11): 0.5 mg via RESPIRATORY_TRACT
  Filled 2014-02-25 (×11): qty 2.5

## 2014-02-25 MED ORDER — ALTEPLASE 2 MG IJ SOLR
2.0000 mg | Freq: Once | INTRAMUSCULAR | Status: AC
  Administered 2014-02-25: 2 mg
  Filled 2014-02-25: qty 2

## 2014-02-25 MED ORDER — LEVALBUTEROL HCL 0.63 MG/3ML IN NEBU
0.6300 mg | INHALATION_SOLUTION | Freq: Three times a day (TID) | RESPIRATORY_TRACT | Status: DC
Start: 2014-02-26 — End: 2014-03-02
  Administered 2014-02-26 – 2014-03-02 (×11): 0.63 mg via RESPIRATORY_TRACT
  Filled 2014-02-25 (×23): qty 3

## 2014-02-25 MED ORDER — HYDROCORTISONE 1 % EX CREA
TOPICAL_CREAM | Freq: Two times a day (BID) | CUTANEOUS | Status: DC | PRN
  Filled 2014-02-25: qty 28

## 2014-02-25 NOTE — Progress Notes (Addendum)
02/25/2014 RN was called to room by patient. Patient had blood is stool it was dark at 1630. Did notified Neurologist on called Dr Janann Colonel. Orders were given for a CBC to be collected and to hold the 2200 Eliquis until in the Am. A care order was placed in the computer by RN. Per Dr Janann Colonel call result of CBC. [facility] RN.

## 2014-02-25 NOTE — Progress Notes (Signed)
02/25/14- PCCM- brief contact note. Dr Leonie Man asked valve for trach so patient can speak.  She was alert, breathing unlabored with trach collar. Order for  SLP consult for Passey-Muir Valve assessment- in place as of 02/23/14. Ordered PMV as tolerated per SLP Please call if ongoing f/u needed. -CD Annamaria Boots, MD

## 2014-02-25 NOTE — Progress Notes (Signed)
02/25/2014 DR Janann Colonel was made aware the result of CBC and continue to monitor patient. [facility] RN.

## 2014-02-25 NOTE — Progress Notes (Signed)
Patient's trach had become dislodged, unable to pass catheter and no color change with end tidal.  Patient was in no distress, sats >96%, rr 16-20.  Lurline Idol was removed and replaced using obturator without incident.  Patient tolerated well, no bleeding, sats and HR stable throughout.  Positive color change with end tidal C02 detector.

## 2014-02-25 NOTE — Progress Notes (Signed)
02/25/2014 First shift RN did report to Corene Cornea the third shift nurse not to give Yoder. [facility] RN.

## 2014-02-25 NOTE — Progress Notes (Signed)
STROKE TEAM PROGRESS NOTE   SUBJECTIVE (INTERVAL HISTORY):  No family member is at bedside. Pt is on PEG and trach. Awake and interactive. On TF.  No complaints. Wants to talk   Contacted CCM to change trach to uncuffed trach for preparation nursing home transfer. Remains afebrile OBJECTIVE Temp:  [96.9 F (36.1 C)-98.1 F (36.7 C)] 97.6 F (36.4 C) (01/10 0851) Pulse Rate:  [96-136] 104 (01/10 0851) Cardiac Rhythm:  [-] Atrial flutter (01/09 2000) Resp:  [20-30] 22 (01/10 0455) BP: (111-133)/(70-97) 123/75 mmHg (01/10 0851) SpO2:  [96 %-100 %] 100 % (01/10 0804) FiO2 (%):  [28 %] 28 % (01/10 0756) Weight:  [170 lb 6.7 oz (77.3 kg)] 170 lb 6.7 oz (77.3 kg) (01/10 0339)   Recent Labs Lab 02/21/14 1220 02/21/14 1632 02/22/14 0759 02/22/14 1224 02/22/14 1608  GLUCAP 167* 149* 126* 155* 122*    Recent Labs Lab 02/19/14 0500 02/20/14 0618 02/21/14 0422 02/23/14 0420 02/24/14 0530  NA 141 141 140 141 143  K 3.6 3.3* 3.8 4.3 4.6  CL 103 100 101 105 108  CO2 31 31 32 24 28  GLUCOSE 144* 107* 119* 141* 165*  BUN 19 22 27* 53* 68*  CREATININE 0.45* 0.54 0.58 0.97 1.07  CALCIUM 8.6 8.3* 9.0 9.4 9.6  MG 2.0 1.7 2.1  --   --   PHOS 2.8 2.9 3.2  --   --    No results for input(s): AST, ALT, ALKPHOS, BILITOT, PROT, ALBUMIN in the last 168 hours.  Recent Labs Lab 02/19/14 0500 02/20/14 0618 02/20/14 0706 02/21/14 0422 02/23/14 0420 02/24/14 0530  WBC 21.3* TEST REQUEST RECEIVED WITHOUT APPROPRIATE SPECIMEN 24.1* 18.5* 25.2* 27.0*  NEUTROABS 18.8* PENDING 21.3* 16.0*  --   --   HGB 9.5* TEST REQUEST RECEIVED WITHOUT APPROPRIATE SPECIMEN 9.6* 10.9* 12.1 12.3  HCT 31.6* TEST REQUEST RECEIVED WITHOUT APPROPRIATE SPECIMEN 32.5* 36.1 40.6 41.6  MCV 80.8 TEST REQUEST RECEIVED WITHOUT APPROPRIATE SPECIMEN 79.7 81.7 79.5 80.3  PLT 341 TEST REQUEST RECEIVED WITHOUT APPROPRIATE SPECIMEN 377 379 366 369   No results for input(s): CKTOTAL, CKMB, CKMBINDEX, TROPONINI in the last 168  hours. No results for input(s): LABPROT, INR in the last 72 hours. No results for input(s): COLORURINE, LABSPEC, [place], GLUCOSEU, HGBUR, BILIRUBINUR, KETONESUR, PROTEINUR, UROBILINOGEN, NITRITE, LEUKOCYTESUR in the last 72 hours.  Invalid input(s): APPERANCEUR     Component Value Date/Time   CHOL 114 01/30/2014 0337   TRIG 102 01/30/2014 0337   HDL 24* 01/30/2014 0337   CHOLHDL 4.8 01/30/2014 0337   VLDL 20 01/30/2014 0337   LDLCALC 70 01/30/2014 0337   Lab Results  Component Value Date   HGBA1C 6.4* 01/30/2014      Component Value Date/Time   LABOPIA NONE DETECTED 01/29/2014 0659   COCAINSCRNUR NONE DETECTED 01/29/2014 0659   LABBENZ NONE DETECTED 01/29/2014 0659   AMPHETMU NONE DETECTED 01/29/2014 0659   THCU NONE DETECTED 01/29/2014 0659   LABBARB NONE DETECTED 01/29/2014 0659    No results for input(s): ETH in the last 168 hours.  I have personally reviewed the radiological images below and agree with the radiology interpretations.  Ct Head Wo Contrast  02/01/2014  Stat CT scan shows no acute changes in the brain, but there appears to be a thrombus in the basilar artery that is new.  01/30/2014    1. Resolved right MCA territory hyperdensity present at 1056 hrs yesterday, most likely was post Neurointervention parenchymal contrast staining. 2. Mild cytotoxic edema suspected in  that same right MCA territory. Underlying chronic ischemic changes. 3. Stable parafalcine subdural hematoma. No new intracranial hemorrhage or acute mass effect. 4. No new intracranial abnormality.    01/29/2014    Acute infarct right MCA territory as noted on CT perfusion study. There is interval development of high density in the right temporoparietal cortex which may be due to contrast material or hemorrhage. Continued follow-up recommended.  Small interhemispheric subdural hematoma is unchanged.    01/29/2014   1. 4 mm thick posterior falx subdural hemorrhage. 2. Prominent density at the right M1/M2  junction. CTA is already planned. 3. Established infarcts in the anterior and posterior right MCA territory. No definitive acute infarct. 4. Left frontal scalp contusion.  No acute fracture.    Ct Cerebral Perfusion W/cm 01/29/2014    1. Occlusive thrombus at the right M1-M2 junction. Subjectively good pial/pial collateral circulation. 2. Perfusion findings suggest moderate acute infarct in the central right MCA territory with rim of penumbra, as above. 3. Remote infarcts at the anterior and posterior margins of the right MCA territory. 4. Thin posterior falx subdural hemorrhage.     Cerebral Angiogram  01/29/2014 S/P Bilateral common carotid arteriograms, followed by complete revascularization of RT MCA M1 occlusion usingX2 passes with the Solitaire FR 19mm x 69mm retrieval device And 10 mg of  superselective intracranial IA Integrelin. TICI 3 flow reesablished.   MRI brain  02/02/14 1. New, acute infarcts in the posterior circulation related to treated basilar thrombosis. Infarcts present in the bilateral cerebellum, upper right brainstem, mesial right temporal lobe, and minimally along the right occipital cortex. Normal flow voids within the intracranial vessels. 2. Stable appearance of recent right MCA territory infarct. 3. Stable trace falcine subdural hemorrhage.  01/31/14 1. Acute nonhemorrhagic infarct involving the superior right temporal lobe, right insular ribbon, right frontal operculum, and posterior right frontal lobe. 2. More remote infarcts are again noted in the more anterior inferior right frontal lobe and the right parietal lobe. 3. Extensive white matter changes bilaterally likely reflect the sequela of chronic microvascular ischemia, advanced for age. 4. White matter changes extend into the brainstem. 5. Remote tender infarcts of the cerebellum bilaterally, worse on the right.  Dg Chest Port 1 View 02/08/2014    1. Right PICC line stable position. 2. Congestive heart  failure with interstitial edema and small right pleural effusion. Mild improvement from prior exam. 02/10/2014 Improved bibasilar atelectasis versus airspace disease. 02/13/2014 - No pneumothorax status post bilateral chest tube placement. Stable support apparatus 02/19/2014- Resolved left basilar pneumothorax. Bilateral hazy airspace disease not significantly changed  2D Echocardiogram  EF 50-55% with no source of embolus.   A1C 6.4 and LDL 70   Physical Exam General: The patient is awake, alert and following simple commands. S/p trach and peg.The patient is obese.   Respiratory: decrease breath sounds at the bilateral bases with rhonchi and wheezing Cardiovascular: irregular rate and rhythm, no murmurs noted Abdomen: Soft Skin: No significant peripheral edema is noted. Neurologic Exam Mental status: The patient is awake alert cooperative, follows simple commands.  Cranial nerves: Pupils are equal, reactive to light. Difficulty with upgaze. Blnk to threat in all quadrants, VFF. Mild left facial asymmetry. horase voice but can be understood. Motor: mild weakness left arm, left arm drift. Bilateral Hip flexion mild weakness. Otherwise intact.  Sensory examination: The patient has decreased sensation on the left arm Coordination: No obvious ataxia seen on the extremities. Difficult to test. Intact FTN, difficult to test HTS.  Gait and station: Gait was not tested. Reflexes: Deep tendon reflexes are brisk throughout.  ASSESSMENT/PLAN Summer Bradley is a 69 y.o. female with history of hypertension, stroke, afib and COPD presenting with Left-sided weakness. She did not receive IV t-PA due to delay in arrival and ? SDH. But underwent mechanical thrombectomy of right MCA with complete vascularization of right MCA M1 occlusion with IA Integrilin. 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 Dr. Estanislado Pandy was able to extract the clot, and the patient  has sustained a right pontine and midbrain stroke, small bilateral cerebellar strokes, and some extension of the infarct in the mesial temporal area on the right. S/p trach and peg   Stroke:  R MCA infarct secondary to R MCA occlusion s/p revascularization with mechanical thrombectomy and IA integrelin. 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 Infarcts embolic secondary to Afib with subtherapeutic INR on coumadin.  Resultant left arm weakness and left facial droop  2D Echo  EF 50-55% no source of embolus  HgbA1c 6.4  eliquis po for VTE prophylaxis  aspirin 81 mg orally every day and warfarin prior to admission, changed to eliquis for stroke prevention. Resume eliquis.   Neurologically stable   PT/OT on board and recommend SNF  Deposition - SNF tomorrow after off sitter for 24 hours.    Acute Respiratory Failure / COPD exacerbation   Intubated for neuro intervention-extubated 02/06/14  Tolerated well 24 hours post extubation.  Once to the floor 12/23, overnight developed more respiratory distress and concerning for aspiration  12/24 Transferred to step down unit for further monitoring  Developed the respiratory distress again on 02/13/2014, intubated and transferred to neuro ICU  Trach done on 02/15/14. Off vent now.  LTAC placement  unasyn discontinued  On Xopenex and mucomyst neb treatment  On steroids tapering  Suction PRN  Atrial Fibrillation / A flutter with AV block  Home meds:  Warfarin  INR subtherapeutic on admission, INR 1.18   started Cardizem and s/p d/c amiodarone.   Rate controlled.  On eliquis for stroke prevention  Dysphagia  Aspiration pneumonia  Developed a respiratory distress again on 02/13/2014, intubated and transferred to neuro ICU. Concerning for aspiration again.   PEG done 02/15/14. TF restarted  Speech  following  Positive stool occult blood test   No black stool   No frank blood in stool  Hb improved than before  Positive test likely due to her recent PEG procedure  Will resume eliquis  Close monitoring  Sun-downing  d/c sitter  d/c restraint  tapered off clonazepam  low dose seroquel   Hypertension  Continue to monitor  BP stable 120-140  On bisoprolol  Hyperlipidemia  Home meds:  Mevacor  LDL 70, goal < 70  On pravastatin  Continue statin on d/c  Trush  Fluconazole for 14 days  Started on 02/10/2014  Other Stroke Risk Factors  Advanced age  Obesity, Body mass index is 31.16 kg/(m^2).   Hx stroke/TIA  Other Active Problems  Depression, on chronic SSRI  Anemia - improved  Leukocytosis - on solumedrol - afebrile 18.2->20.5->21.3->24.1->18.5 Plan repeat CBC and BMP in am and await SNF bed next 1-2 days Hospital day # 62   Antony Contras, MD Stroke Neurology 02/25/2014 11:39 AM    To contact Stroke Continuity provider, please refer to [email or web address] After hours, contact General Neurology

## 2014-02-26 DIAGNOSIS — R0602 Shortness of breath: Secondary | ICD-10-CM

## 2014-02-26 LAB — BASIC METABOLIC PANEL
Anion gap: 9 (ref 5–15)
BUN: 77 mg/dL — ABNORMAL HIGH (ref 6–23)
CO2: 31 mmol/L (ref 19–32)
Calcium: 9.2 mg/dL (ref 8.4–10.5)
Chloride: 105 mEq/L (ref 96–112)
Creatinine, Ser: 1.11 mg/dL — ABNORMAL HIGH (ref 0.50–1.10)
GFR calc Af Amer: 57 mL/min — ABNORMAL LOW (ref >90)
GFR calc non Af Amer: 49 mL/min — ABNORMAL LOW (ref >90)
Glucose, Bld: 181 mg/dL — ABNORMAL HIGH (ref 70–99)
Potassium: 3.8 mmol/L (ref 3.5–5.1)
Sodium: 145 mmol/L (ref 135–145)

## 2014-02-26 LAB — CBC WITH DIFFERENTIAL/PLATELET
Basophils Absolute: 0 10*3/uL (ref 0.0–0.1)
Basophils Relative: 0 % (ref 0–1)
Eosinophils Absolute: 0 10*3/uL (ref 0.0–0.7)
Eosinophils Relative: 0 % (ref 0–5)
HCT: 40.3 % (ref 36.0–46.0)
Hemoglobin: 12.3 g/dL (ref 12.0–15.0)
Lymphocytes Relative: 6 % — ABNORMAL LOW (ref 12–46)
Lymphs Abs: 1.9 10*3/uL (ref 0.7–4.0)
MCH: 25 pg — ABNORMAL LOW (ref 26.0–34.0)
MCHC: 30.5 g/dL (ref 30.0–36.0)
MCV: 81.9 fL (ref 78.0–100.0)
Monocytes Absolute: 1.5 10*3/uL — ABNORMAL HIGH (ref 0.1–1.0)
Monocytes Relative: 5 % (ref 3–12)
Neutro Abs: 27.5 10*3/uL — ABNORMAL HIGH (ref 1.7–7.7)
Neutrophils Relative %: 89 % — ABNORMAL HIGH (ref 43–77)
Platelets: 325 10*3/uL (ref 150–400)
RBC: 4.92 MIL/uL (ref 3.87–5.11)
RDW: 21.2 % — ABNORMAL HIGH (ref 11.5–15.5)
WBC: 30.9 10*3/uL — ABNORMAL HIGH (ref 4.0–10.5)

## 2014-02-26 NOTE — Progress Notes (Signed)
Eliquis was not given per MD request and RN report. The 2200 dose on 02/25/2014 was held.  Alessandra Grout, RN

## 2014-02-26 NOTE — Clinical Social Work Note (Addendum)
12:00pm CSW sent out clinicals to [facility] to see if there would be bed offers if patient discharges patient without trach- family first choice is Estate [facility].  11:00am Family states they are unable to pay for ambulance transportation.  CSW confirmed that insurance would not cover any of the transport due to the distance (over 80miles).  CSW spoke with neurology physician who stated that it would not be their recommendation to go by private vehicle but if the family was insistent it would be possible. CSW relayed this information to family- they will discuss but are feeling as if their only option at this time is to find a way to get her to [facility] in [place].  Family is still not agreeable to any of the local bed offers that were made on patient.    [name] to discuss with patients other daughter.  10:10am CSW spoke with [facility] in [place]- no insurance authorization yet but anticipated to have auth this morning.   CSW spoke with [facility] who quoted transportation to cost 1,[phone] to go to [place] informed family who will call transport company with credit card information.  CSW will continue to follow.  [name], [place] Social Worker [phone]

## 2014-02-26 NOTE — Progress Notes (Signed)
Pt trach capped per MD order. Pt tolerated well. SPO2 100%. No distress noted.

## 2014-02-26 NOTE — Progress Notes (Addendum)
    Interval: We down-sized trach to #6 on 1/7. She has tolerated this very well and has excellent Phonation with PMV.   Subjective No acute distress.   Objective BP 116/76 mmHg  Pulse 90  Temp(Src) 97.3 F (36.3 C) (Oral)  Resp 25  Ht 5\' 2"  (1.575 m)  Wt 68 kg (149 lb 14.6 oz)  BMI 27.41 kg/m2  SpO2 96%  Currently on Room air  HENT: #6 trach, cuffless. Has excellent phonation w/ trach occlusion by finger. Strong cough, no desaturation and distress Pulm: Clear, no accessory muscle use.  Card: RRR Abd: soft, non-tender + bowel sounds.  GU: voids Neuro: awake, impulsive. Speech clear.    Recent Labs Lab 02/23/14 0420 02/24/14 0530 02/26/14 0545  NA 141 143 145  K 4.3 4.6 3.8  CL 105 108 105  CO2 24 28 31   BUN 53* 68* 77*  CREATININE 0.97 1.07 1.11*  GLUCOSE 141* 165* 181*    Recent Labs Lab 02/24/14 0530 02/25/14 1648 02/26/14 0545  HGB 12.3 11.9* 12.3  HCT 41.6 39.7 40.3  WBC 27.0* 30.7* 30.9*  PLT 369 339 325    Impression/Plan  Tracheostomy Status s/p CVA: she has excellent phonation, strong cough and no distress w/ trach occluded.  Plan  Cap trach over-night w/ cont pulse ox  decannulate 1/12 if tolerates well. Given she will likely go to non-trach rehab facility after; think  best to monitor 24 hrs s/p trach removal to assure she tolerates this well.   Erick Colace ACNP-BC [place] Pager # [phone] OR # [phone] if no answer  Trach capped now and doing well.  Passed swallow evaluation.  Will likely be decannulated in AM.  Patient seen and examined, agree with above note.  I dictated the care and orders written for this patient under my direction.  Rush Farmer, MD [phone]

## 2014-02-26 NOTE — Progress Notes (Signed)
STROKE TEAM PROGRESS NOTE   SUBJECTIVE (INTERVAL HISTORY):  No family member is at bedside. Pt pulled out trach last pm but was reinserted. She had done well without the trach for short period hence it is capped this am to see if she can tolerate it.  . Remains afebrile but WBC high at 30.000. Had one black bowel movement y`day hence Eliquis was held but repeat hematocrit stable this am.Spoke to daughter over the phone and she is willing to transfer to SNF later today but now plans on hold till trach is removed as options will change. OBJECTIVE Temp:  [97 F (36.1 C)-97.9 F (36.6 C)] 97.8 F (36.6 C) (01/11 1146) Pulse Rate:  [36-140] 120 (01/11 1146) Cardiac Rhythm:  [-] Atrial flutter (01/11 0815) Resp:  [16-25] 20 (01/11 1146) BP: (115-144)/(66-103) 116/76 mmHg (01/11 1146) SpO2:  [94 %-99 %] 96 % (01/11 0600) FiO2 (%):  [28 %] 28 % (01/11 1146) Weight:  [149 lb 14.6 oz (68 kg)] 149 lb 14.6 oz (68 kg) (01/11 0445)   Recent Labs Lab 02/21/14 1220 02/21/14 1632 02/22/14 0759 02/22/14 1224 02/22/14 1608  GLUCAP 167* 149* 126* 155* 122*    Recent Labs Lab 02/20/14 0618 02/21/14 0422 02/23/14 0420 02/24/14 0530 02/26/14 0545  NA 141 140 141 143 145  K 3.3* 3.8 4.3 4.6 3.8  CL 100 101 105 108 105  CO2 31 32 24 28 31   GLUCOSE 107* 119* 141* 165* 181*  BUN 22 27* 53* 68* 77*  CREATININE 0.54 0.58 0.97 1.07 1.11*  CALCIUM 8.3* 9.0 9.4 9.6 9.2  MG 1.7 2.1  --   --   --   PHOS 2.9 3.2  --   --   --    No results for input(s): AST, ALT, ALKPHOS, BILITOT, PROT, ALBUMIN in the last 168 hours.  Recent Labs Lab 02/20/14 0618 02/20/14 0706 02/21/14 0422 02/23/14 0420 02/24/14 0530 02/25/14 1648 02/26/14 0545  WBC TEST REQUEST RECEIVED WITHOUT APPROPRIATE SPECIMEN 24.1* 18.5* 25.2* 27.0* 30.7* 30.9*  NEUTROABS PENDING 21.3* 16.0*  --   --   --  27.5*  HGB TEST REQUEST RECEIVED WITHOUT APPROPRIATE SPECIMEN 9.6* 10.9* 12.1 12.3 11.9* 12.3  HCT TEST REQUEST RECEIVED WITHOUT  APPROPRIATE SPECIMEN 32.5* 36.1 40.6 41.6 39.7 40.3  MCV TEST REQUEST RECEIVED WITHOUT APPROPRIATE SPECIMEN 79.7 81.7 79.5 80.3 80.0 81.9  PLT TEST REQUEST RECEIVED WITHOUT APPROPRIATE SPECIMEN 377 379 366 369 339 325   No results for input(s): CKTOTAL, CKMB, CKMBINDEX, TROPONINI in the last 168 hours. No results for input(s): LABPROT, INR in the last 72 hours. No results for input(s): COLORURINE, LABSPEC, [place], GLUCOSEU, HGBUR, BILIRUBINUR, KETONESUR, PROTEINUR, UROBILINOGEN, NITRITE, LEUKOCYTESUR in the last 72 hours.  Invalid input(s): APPERANCEUR     Component Value Date/Time   CHOL 114 01/30/2014 0337   TRIG 102 01/30/2014 0337   HDL 24* 01/30/2014 0337   CHOLHDL 4.8 01/30/2014 0337   VLDL 20 01/30/2014 0337   LDLCALC 70 01/30/2014 0337   Lab Results  Component Value Date   HGBA1C 6.4* 01/30/2014      Component Value Date/Time   LABOPIA NONE DETECTED 01/29/2014 0659   COCAINSCRNUR NONE DETECTED 01/29/2014 0659   LABBENZ NONE DETECTED 01/29/2014 0659   AMPHETMU NONE DETECTED 01/29/2014 0659   THCU NONE DETECTED 01/29/2014 0659   LABBARB NONE DETECTED 01/29/2014 0659    No results for input(s): ETH in the last 168 hours.  I have personally reviewed the radiological images below and agree with  the radiology interpretations.  Ct Head Wo Contrast  02/01/2014  Stat CT scan shows no acute changes in the brain, but there appears to be a thrombus in the basilar artery that is new.  01/30/2014    1. Resolved right MCA territory hyperdensity present at 1056 hrs yesterday, most likely was post Neurointervention parenchymal contrast staining. 2. Mild cytotoxic edema suspected in that same right MCA territory. Underlying chronic ischemic changes. 3. Stable parafalcine subdural hematoma. No new intracranial hemorrhage or acute mass effect. 4. No new intracranial abnormality.    01/29/2014    Acute infarct right MCA territory as noted on CT perfusion study. There is interval development  of high density in the right temporoparietal cortex which may be due to contrast material or hemorrhage. Continued follow-up recommended.  Small interhemispheric subdural hematoma is unchanged.    01/29/2014   1. 4 mm thick posterior falx subdural hemorrhage. 2. Prominent density at the right M1/M2 junction. CTA is already planned. 3. Established infarcts in the anterior and posterior right MCA territory. No definitive acute infarct. 4. Left frontal scalp contusion.  No acute fracture.    Ct Cerebral Perfusion W/cm 01/29/2014    1. Occlusive thrombus at the right M1-M2 junction. Subjectively good pial/pial collateral circulation. 2. Perfusion findings suggest moderate acute infarct in the central right MCA territory with rim of penumbra, as above. 3. Remote infarcts at the anterior and posterior margins of the right MCA territory. 4. Thin posterior falx subdural hemorrhage.     Cerebral Angiogram  01/29/2014 S/P Bilateral common carotid arteriograms, followed by complete revascularization of RT MCA M1 occlusion usingX2 passes with the Solitaire FR 76mm x 29mm retrieval device And 10 mg of  superselective intracranial IA Integrelin. TICI 3 flow reesablished.   MRI brain  02/02/14 1. New, acute infarcts in the posterior circulation related to treated basilar thrombosis. Infarcts present in the bilateral cerebellum, upper right brainstem, mesial right temporal lobe, and minimally along the right occipital cortex. Normal flow voids within the intracranial vessels. 2. Stable appearance of recent right MCA territory infarct. 3. Stable trace falcine subdural hemorrhage.  01/31/14 1. Acute nonhemorrhagic infarct involving the superior right temporal lobe, right insular ribbon, right frontal operculum, and posterior right frontal lobe. 2. More remote infarcts are again noted in the more anterior inferior right frontal lobe and the right parietal lobe. 3. Extensive white matter changes bilaterally likely  reflect the sequela of chronic microvascular ischemia, advanced for age. 4. White matter changes extend into the brainstem. 5. Remote tender infarcts of the cerebellum bilaterally, worse on the right.  Dg Chest Port 1 View 02/08/2014    1. Right PICC line stable position. 2. Congestive heart failure with interstitial edema and small right pleural effusion. Mild improvement from prior exam. 02/10/2014 Improved bibasilar atelectasis versus airspace disease. 02/13/2014 - No pneumothorax status post bilateral chest tube placement. Stable support apparatus 02/19/2014- Resolved left basilar pneumothorax. Bilateral hazy airspace disease not significantly changed  2D Echocardiogram  EF 50-55% with no source of embolus.   A1C 6.4 and LDL 70   Physical Exam General: The patient is awake, alert and following simple commands. S/p trach and peg.  Respiratory: decrease breath sounds at the bilateral bases with rhonchi and wheezing Cardiovascular: irregular rate and rhythm, no murmurs noted Abdomen: Soft. Peg tube Skin: No significant peripheral edema is noted. Neurologic Exam Mental status: The patient is awake alert cooperative, follows simple commands.  Cranial nerves: Pupils are equal, reactive to light. Difficulty with  upgaze. Blnk to threat in all quadrants, VFF. Mild left facial asymmetry. horase voice but can be understood. Motor: mild weakness left arm, left arm drift. Bilateral Hip flexion mild weakness. Otherwise intact.  Sensory examination: The patient has decreased sensation on the left arm Coordination: No obvious ataxia seen on the extremities. Difficult to test. Intact FTN, difficult to test HTS.  Gait and station: Gait was not tested. Reflexes: Deep tendon reflexes are brisk throughout.  ASSESSMENT/PLAN Ms. Summer Bradley is a 69 y.o. female with history of hypertension, stroke, afib and COPD presenting with Left-sided weakness. She did not receive IV t-PA due to delay in arrival  and ? SDH. But underwent mechanical thrombectomy of right MCA with complete vascularization of right MCA M1 occlusion with IA Integrilin. 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 Dr. Estanislado Pandy was able to extract the clot, and the patient has sustained a right pontine and midbrain stroke, small bilateral cerebellar strokes, and some extension of the infarct in the mesial temporal area on the right. S/p trach and peg   Stroke:  R MCA infarct secondary to R MCA occlusion s/p revascularization with mechanical thrombectomy and IA integrelin. 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 Infarcts embolic secondary to Afib with subtherapeutic INR on coumadin.  Resultant left arm weakness and left facial droop  2D Echo  EF 50-55% no source of embolus  HgbA1c 6.4  eliquis po for VTE prophylaxis  aspirin 81 mg orally every day and warfarin prior to admission, changed to eliquis for stroke prevention. Resume eliquis.   Neurologically stable   PT/OT on board and recommend SNF  Deposition - SNF tomorrow after off sitter for 24 hours.    Acute Respiratory Failure / COPD exacerbation   Intubated for neuro intervention-extubated 02/06/14  Tolerated well 24 hours post extubation.  Once to the floor 12/23, overnight developed more respiratory distress and concerning for aspiration  12/24 Transferred to step down unit for further monitoring  Developed the respiratory distress again on 02/13/2014, intubated and transferred to neuro ICU  Trach done on 02/15/14. Off vent now.  unasyn discontinued  On Xopenex and mucomyst neb treatment  On steroids tapering  Suction PRN  Atrial Fibrillation / A flutter with AV block  Home meds:  Warfarin  INR subtherapeutic on admission, INR 1.18   started Cardizem and s/p d/c amiodarone.   Rate  controlled.  On eliquis for stroke prevention  Dysphagia  Aspiration pneumonia  Developed a respiratory distress again on 02/13/2014, intubated and transferred to neuro ICU. Concerning for aspiration again.   PEG done 02/15/14. TF restarted  Speech following  Positive stool occult blood test   No black stool   No frank blood in stool  Hb improved than before  Positive test likely due to her recent PEG procedure  Will resume eliquis  Close monitoring  Sun-downing  d/c sitter  d/c restraint  tapered off clonazepam  low dose seroquel   Hypertension  Continue to monitor  BP stable 120-140  On bisoprolol  Hyperlipidemia  Home meds:  Mevacor  LDL 70, goal < 70  On pravastatin  Continue statin on d/c  Trush  Fluconazole for 14 days  Started on 02/10/2014  Other Stroke Risk Factors  Advanced age  Obesity, Body mass index is 27.41 kg/(m^2).   Hx stroke/TIA  Other Active Problems  Depression, on chronic SSRI  Anemia - improved  Leukocytosis -  on solumedrol - afebrile 18.2->20.5->21.3->24.1->18.5 Plan  Cap trach today per PCCM and observe 24 hours prior to removing trach and await SNF bed next 1-2 days Hospital day # [place], MD Stroke Neurology 02/26/2014 12:56 PM    To contact Stroke Continuity provider, please refer to [email or web address] After hours, contact General Neurology

## 2014-02-26 NOTE — Progress Notes (Signed)
Physical Therapy Treatment Patient Details Name: Summer Bradley MRN: [id number] DOB: 02-06-1946 Today's Date: 02/26/2014    History of Present Illness pt presents post R MCA clot retrieval with revascularization.  pt intubated 12/14-15, 12/17-22, and re-intubated 12/29, extubated12/30, trach placed 12/31.     PT Comments    Patient progressing with mobility and activity tolerance. Tolerated EOB activity for increased time, less physical assist required. Patient able to perform OOB to chair with 1 person assist. VSS on capped trach. Will continue to see and progress as tolerated. Anticipate patient will progress well with mobility with ST SNF.  Follow Up Recommendations  SNF     Equipment Recommendations  None recommended by PT    Recommendations for Other Services Rehab consult     Precautions / Restrictions Precautions Precautions: Fall Restrictions Weight Bearing Restrictions: No    Mobility  Bed Mobility Overal bed mobility: Needs Assistance Bed Mobility: Supine to Sit Rolling: Min assist;Mod assist   Supine to sit: Min assist     General bed mobility comments: Assist to elevate trunk to upright, assist of UE to pull to sit  Transfers Overall transfer level: Needs assistance Equipment used: 1 person hand held assist Transfers: Sit to/from [facility] Sit to Stand: Min assist Stand pivot transfers: Mod assist       General transfer comment: Pt requires cues for hand placement.  She stands with min A +2, but requires mod A +2 to pivot as she requires assist for balance, and to advance Lt.   Ambulation/Gait                 Stairs            Wheelchair Mobility    Modified Rankin (Stroke Patients Only)       Balance   Sitting-balance support: Single extremity supported;Feet supported Sitting balance-Leahy Scale: Fair Sitting balance - Comments: Tolerated stting EOB with some dynamic activity      Standing balance-Leahy  Scale: Poor                      Cognition Arousal/Alertness: Awake/alert Behavior During Therapy: Impulsive Overall Cognitive Status: Impaired/Different from baseline Area of Impairment: Attention   Current Attention Level: Selective   Following Commands: Follows one step commands consistently Safety/Judgement: Decreased awareness of deficits Awareness: Emergent Problem Solving: Slow processing;Decreased initiation;Difficulty sequencing;Requires verbal cues;Requires tactile cues      Exercises      General Comments        Pertinent Vitals/Pain Pain Assessment: No/denies pain    Home Living                      Prior Function            PT Goals (current goals can now be found in the care plan section) Acute Rehab PT Goals Patient Stated Goal: to go home PT Goal Formulation: With patient Time For Goal Achievement: 03/08/14 Potential to Achieve Goals: Good Progress towards PT goals: Progressing toward goals    Frequency  Min 2X/week    PT Plan Current plan remains appropriate;Frequency needs to be updated    Co-evaluation             End of Session Equipment Utilized During Treatment: Oxygen Activity Tolerance: Patient limited by fatigue Patient left: in chair;with call bell/phone within reach;with chair alarm set     Time: 6063-0160 PT Time Calculation (min) (ACUTE ONLY): 24 min  Charges:  $  Therapeutic Activity: 23-37 mins                    G CodesDuncan Dull 03-14-14, 2:46 PM Alben Deeds, [place] DPT  [phone]

## 2014-02-26 NOTE — Progress Notes (Addendum)
Speech Language Pathology Treatment: Dysphagia;Cognitive-Linquistic  Patient Details Name: Summer Bradley MRN: [id number] DOB: Sep 15, 1945 Today's Date: 02/26/2014 Time: 0812-0832 SLP Time Calculation (min) (ACUTE ONLY): 20 min  Assessment / Plan / Recommendation Clinical Impression  Treatment focused on cognition and dysphagia during snack. PMSV donned (cuffless trach) with 100% O2 sats, 18 RR and 102 RR with moderately decreased respiratory support. Vocal quality hoarse with adequate intensity and intelligibility during responses in phrases. No evidence of air trapping. No cough, throat clear or wet vocal quality following puree and honey thick liquid trials; no mucous or po's expelled from trach with cue to cough. She has been afebrile with no change in lung sounds. Pt required moderate reminders for small sips and slow rate with clinical reasoning provided re: impulsivity. Recommend continue Dys 1 and honey thick liquids; must have valve donned with meals/snacks and full supervision/assist due to impulsivity. Continue ST while here and at SNF.   HPI HPI: 69 yo developed neuro changes 12/14/15with left side weakness that resulted in a fall. Found to have right MCA clot, taken to IR for removal and revascularization procedure. 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 Functionally, these deficits manifested as left buccal residue, reduced oral manipulation and control, anterior spillage, and wet phonation and cough associated with ice chips/thin liquids. Pt advanced to dysphagia 2, thin liquids on 12/16. 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 Reintubated 12/17-12/22. Revascularization 12/17. 12/18 MRI reveals new multi focal infarcts in the posterior circulation, affecting the mesial right temporal lobe, right occipital cortex, bilateral mid and upper  cerebellum and right upper pons and midbrain. FEES 12/23 with recs to begin dysphagia 1, honey-thick liquids. Pt subsequently with respiratory decline, made NPO. Repeat BSE 12/25 recommended resume Dys 1, honey liquids. SLP treatment session with breakfast 12/29 with max cues with feeding due to impulsivity, however no evidence of aspiraiton.  with reintubation 12/23 pm.One hour following session pt developed airway plugging, decreased responsiveness was reintubated 12/29 and received trach 12/31, tolerating trach collar and PMSV ordered.   Pertinent Vitals Pain Assessment: No/denies pain  SLP Plan  Continue with current plan of care    Recommendations Diet recommendations: Dysphagia 1 (puree);Honey-thick liquid Liquids provided via: Cup;No straw Medication Administration: Crushed with puree Supervision: Patient able to self feed;Full supervision/cueing for compensatory strategies;Staff to assist with self feeding Compensations: Slow rate;Small sips/bites;Check for pocketing;Check for anterior loss Postural Changes and/or Swallow Maneuvers: Seated upright 90 degrees      Patient may use Passy-Muir Speech Valve: During all waking hours (remove during sleep);Caregiver trained to provide supervision PMSV Supervision: Full       Oral Care Recommendations: Oral care BID Follow up Recommendations: Skilled Nursing facility Plan: Continue with current plan of care    GO     [name] 02/26/2014, 8:43 AM   Orbie Pyo Colvin Caroli.Ed [employer] [phone]

## 2014-02-27 DIAGNOSIS — K921 Melena: Secondary | ICD-10-CM

## 2014-02-27 DIAGNOSIS — K922 Gastrointestinal hemorrhage, unspecified: Secondary | ICD-10-CM | POA: Insufficient documentation

## 2014-02-27 LAB — CBC
HCT: 37.2 % (ref 36.0–46.0)
Hemoglobin: 11.1 g/dL — ABNORMAL LOW (ref 12.0–15.0)
MCH: 24.4 pg — ABNORMAL LOW (ref 26.0–34.0)
MCHC: 29.8 g/dL — ABNORMAL LOW (ref 30.0–36.0)
MCV: 81.9 fL (ref 78.0–100.0)
Platelets: 270 10*3/uL (ref 150–400)
RBC: 4.54 MIL/uL (ref 3.87–5.11)
RDW: 21 % — ABNORMAL HIGH (ref 11.5–15.5)
WBC: 21.5 10*3/uL — ABNORMAL HIGH (ref 4.0–10.5)

## 2014-02-27 MED ORDER — PANTOPRAZOLE SODIUM 40 MG PO PACK
40.0000 mg | PACK | Freq: Two times a day (BID) | ORAL | Status: DC
  Administered 2014-02-27 – 2014-03-05 (×11): 40 mg
  Filled 2014-02-27 (×15): qty 20

## 2014-02-27 NOTE — Progress Notes (Addendum)
Speech Language Pathology Treatment: Cognitive-Linquistic; dysphagia Patient Details Name: Summer Bradley MRN: [id number] DOB: [birth year] Today's Date: 02/27/2014 Time: 9323-5573 SLP Time Calculation (min) (ACUTE ONLY): 24 min  Assessment / Plan / Recommendation Clinical Impression  Pt more lethargic and appeared slightly more confused today. Pt's stools dark and plan on EGD Thurs to assess for possible GI bleed. Plans are to decannulate today per Marni Griffon, LNP; trach plugged mid day yesterday. Less impulsive during self administered cup sips with moderate verbal cues provided. Delayed throat clear and slightly increased work of breathing following honey thick Diet Coke. She exhibited deceased working memory and decreased emergent awareness of of difficulty. Recommend continue Dys 1 and honey thick and reassess with MBS after EGD and results/recommendations.     HPI HPI: 69 yo developed neuro changes 12/14/15with left side weakness that resulted in a fall. Found to have right MCA clot, taken to IR for removal and revascularization procedure. 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 Functionally, these deficits manifested as left buccal residue, reduced oral manipulation and control, anterior spillage, and wet phonation and cough associated with ice chips/thin liquids. Pt advanced to dysphagia 2, thin liquids on 12/16. 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 Reintubated 12/17-12/22. Revascularization 12/17. 12/18 MRI reveals new multi focal infarcts in the posterior circulation, affecting the mesial right temporal lobe, right occipital cortex, bilateral mid and upper cerebellum and right upper pons and midbrain. FEES 12/23 with recs to begin dysphagia 1, honey-thick liquids. Pt subsequently with respiratory decline, made NPO. Repeat BSE 12/25 recommended  resume Dys 1, honey liquids. SLP treatment session with breakfast 12/29 with max cues with feeding due to impulsivity, however no evidence of aspiraiton.  with reintubation 12/23 pm.One hour following session pt developed airway plugging, decreased responsiveness was reintubated 12/29 and received trach 12/31, tolerating trach collar and PMSV ordered.   Pertinent Vitals Pain Assessment: No/denies pain  SLP Plan  Continue with current plan of care    Recommendations Diet recommendations: Dysphagia 1 (puree);Honey-thick liquid Liquids provided via: Cup;No straw Medication Administration: Crushed with puree Supervision: Patient able to self feed;Full supervision/cueing for compensatory strategies;Staff to assist with self feeding Compensations: Slow rate;Small sips/bites;Check for pocketing;Check for anterior loss Postural Changes and/or Swallow Maneuvers: Seated upright 90 degrees              Oral Care Recommendations: Oral care BID Follow up Recommendations: Skilled Nursing facility Plan: Continue with current plan of care    GO     [name] 02/27/2014, 11:18 AM Orbie Pyo Colvin Caroli.Ed [employer] [phone]

## 2014-02-27 NOTE — Progress Notes (Signed)
02/27/2014- 1525- Respiratory care note- Pt decannulated as per MD order.  Occlusive dressing applied as per policy.  Pt tolerated procedure well with pt sats 99% on room air, hr 98, rr22 and pt vocalizing post extubation.  Will cont to monitor progress.  Ambu bag at bedside.  No complications noted at time of procedure.  Stoma site clean. s Tavari Loadholt rrt, rcp

## 2014-02-27 NOTE — Consult Note (Signed)
[facility] Consult: 10:32 AM 02/27/2014  LOS: 29 days    Referring Provider: Dr. Halford Chessman Primary Care Physician:  Dr Clide Deutscher in [place] Primary Gastroenterologist:  Someone in [place]    Reason for Consultation:  Dark stools   HPI: Summer Bradley is a 69 y.o. female.  Hx htn, CVA, s/p AAA repair, Afib but no AC PTA, hypothyroidism.     Patient with vent-dependent respiratory failure following right sided stroke of 01/29/14.  S/p 12/14 radiologic clot removal from the right MCA with re-vascularization.  On 12/17 she underwent another IR procedure with clot removal and revascularization of the basilar artery. Perioperative complications included A. fib with RVR. Aspiration PNA.  She is on Apixiban. S/p 12/31 tracheostomy.  She has neurogenic dysphagia.  On FEES swallow evaluation 12/23 at which time she was allowed dysphagia on an honey thick liquids. However diet was regressed when she developed respiratory decline. Repeat bedside swallow evaluation 12/25 recommended the same dysphagia 1/honey thick liquids. She was subsequently reintubated and then trached.  PEG placed by  Dr Georganna Skeans on 12/31. No ulcers or mucosal abnormalities were noted at that time. Underwent swallow evaluation this morning current diet recommendations are for dysphagia 1/honey thick liquids with close supervision while eating.  Developed dark, bloody stool on 1/10 at 1630. More overnight last night. Some doses of Eliquis were held.  Last dose was on 1/11 at 2100. She had been receiving Protonix via tube once daily, it was increased to twice daily today. Hgb went from 12.3 to 11.9 to 12.3 to 11.1.  Platelets normal.  MCV 81.  Steady rise in BUN from 27 to 50s to 60s to 77 in past 6 days.  Coags rising from normal at admit to 20/1.7 on  12/30.  Have not been checked since. Has only been on Eliquis, not warfarin.    There is no nausea, abdominal pain.  No previous hx UGI bleed.   Has had colonoscopy within the past 3-4 years in [place].  Has not had EGD. On PPI at home and       Past Medical History  Diagnosis Date  . Hypertension   . Stroke   . A-fib   . COPD (chronic obstructive pulmonary disease)     Past Surgical History  Procedure Laterality Date  . Abdominal surgery    . Radiology with anesthesia N/A 01/29/2014    Procedure: RADIOLOGY WITH ANESTHESIA;  Surgeon: Luanne Bras, MD;  Location: [facility];  Service: Radiology;  Laterality: N/A;  . Radiology with anesthesia N/A 02/01/2014    Procedure: RADIOLOGY WITH ANESTHESIA;  Surgeon: Rob Hickman, MD;  Location: [facility];  Service: Radiology;  Laterality: N/A;  . Tracheostomy  02/15/14    feinstein    Prior to Admission medications   Medication Sig Start Date End Date Taking? Authorizing Provider  albuterol (PROVENTIL HFA;VENTOLIN HFA) 108 (90 BASE) MCG/ACT inhaler Inhale 1 puff into the lungs every 6 (six) hours as needed for wheezing or shortness of breath.   Yes Historical Provider, MD  aspirin EC 81  MG tablet Take 81 mg by mouth daily.   Yes Historical Provider, MD  budesonide-formoterol (SYMBICORT) 160-4.5 MCG/ACT inhaler Inhale 2 puffs into the lungs 2 (two) times daily.   Yes Historical Provider, MD  CALCIUM-VITAMIN D PO Take 1 tablet by mouth daily.   Yes Historical Provider, MD  citalopram (CELEXA) 20 MG tablet Take 20 mg by mouth at bedtime.  01/22/14  Yes Historical Provider, MD  clonazePAM (KLONOPIN) 0.5 MG tablet Take 0.5 mg by mouth daily as needed for anxiety.  12/20/13  Yes Historical Provider, MD  fluticasone (FLONASE) 50 MCG/ACT nasal spray Place 1 spray into both nostrils daily as needed for allergies.  12/06/13  Yes Historical Provider, MD  furosemide (LASIX) 20 MG tablet Take 20 mg by mouth daily.  11/06/13  Yes Historical  Provider, MD  ipratropium (ATROVENT HFA) 17 MCG/ACT inhaler Inhale 2 puffs into the lungs 4 (four) times daily.   Yes Historical Provider, MD  levothyroxine (SYNTHROID, LEVOTHROID) 150 MCG tablet Take 150 mcg by mouth daily before breakfast.  01/09/14  Yes Historical Provider, MD  lovastatin (MEVACOR) 10 MG tablet Take 10 mg by mouth daily.  11/06/13  Yes Historical Provider, MD  metoprolol (LOPRESSOR) 100 MG tablet Take 100 mg by mouth 2 (two) times daily.  01/15/14  Yes Historical Provider, MD  Multiple Vitamin (MULTIVITAMIN WITH MINERALS) TABS tablet Take 1 tablet by mouth daily.   Yes Historical Provider, MD  omeprazole (PRILOSEC) 20 MG capsule Take 20 mg by mouth daily.  11/06/13  Yes Historical Provider, MD  verapamil (CALAN-SR) 180 MG CR tablet Take 180 mg by mouth daily.  01/15/14  Yes Historical Provider, MD    Scheduled Meds: . antiseptic oral rinse  7 mL Mouth Rinse QID  . apixaban  5 mg Oral BID  . bisoprolol  2.5 mg Oral BID  . chlorhexidine  15 mL Mouth Rinse BID  . citalopram  20 mg Per Tube QHS  . diltiazem  60 mg Oral 4 times per day  . feeding supplement (PRO-STAT SUGAR FREE 64)  30 mL Per Tube BID  . furosemide  40 mg Intravenous Q12H  . ipratropium  0.5 mg Nebulization TID  . levalbuterol  0.63 mg Nebulization TID  . levothyroxine  150 mcg Per Tube QAC breakfast  . pantoprazole sodium  40 mg Per Tube Daily  . pravastatin  20 mg Per Tube q1800  . predniSONE  10 mg Oral Q breakfast   Followed by  . [START ON 02/28/2014] predniSONE  5 mg Oral Q breakfast  . QUEtiapine  12.5 mg Oral QHS  . sodium chloride  10-40 mL Intracatheter Q12H   Infusions: . feeding supplement (VITAL AF 1.2 CAL) 1,000 mL (02/26/14 0800)   PRN Meds: bisacodyl, guaifenesin, hydrocortisone cream, levalbuterol, metoprolol, midazolam, polyethylene glycol, RESOURCE THICKENUP CLEAR, sodium chloride   Allergies as of 01/29/2014 - Review Complete 01/29/2014  Allergen Reaction Noted  . Lisinopril   01/29/2014    History reviewed. No pertinent family history.  History   Social History  . Marital Status: Widowed    Spouse Name: N/A    Number of Children: N/A  . Years of Education: N/A   Occupational History  . Not on file.   Social History Main Topics  . Smoking status: Former Research scientist (life sciences)  . Smokeless tobacco: Not on file  . Alcohol Use: No  . Drug Use: No  . Sexual Activity: Not on file   Other Topics Concern  . Not on file  Social History Narrative    REVIEW OF SYSTEMS: Constitutional:  Stable weight. ENT:  No nose bleeds Pulm:  Lurline Idol was downsized on 02/23/12 and she is able to phonate with Passy-Muir valve CV:  No palpitations, no LE edema.  GU:  No hematuria, no frequency GI:  Per history of present illness Heme:  No previous issues with anemia.   Transfusions:  None Neuro:  No headaches, no peripheral tingling or numbness Derm:  No itching, no rash or sores.  Endocrine:  No sweats or chills.  No polyuria or dysuria Immunization: Not queried. Travel:  None beyond local counties in last few months.    PHYSICAL EXAM: Vital signs in last 24 hours: Filed Vitals:   02/27/14 0935  BP: 107/52  Pulse: 119  Temp:   Resp: 24   Wt Readings from Last 3 Encounters:  02/26/14 149 lb 14.6 oz (68 kg)  02/02/14 198 lb (89.812 kg)    General: Obese, alert, comfortable, somewhat anxious elderly WF. Head:  Some bruising near the eye.  Eyes:  No scleral icterus or conjunctival pallor. Ears:  Hearing intact  Nose:  Congestion or discharge. Mouth:  Tongue listing to left Neck:  Trach in place, no blood at trach site. Lungs:  Diminished. No labored breathing. Heart: Irregularly irregular. No MRG. Abdomen:  Soft, NT, and D. Long, well-healed midline scar consistent with AAA surgery. Not tender. Bowel sounds not heard. No HSM or masses. No bruits..   Rectal: Not performed.   Musc/Skeltl: No joint contractures or swelling. Extremities:  No pedal edema. Feet are warm.    Neurologic:  Patient is verbal. She is oriented to place, self, and year.  She recalls names of her doctors and prior medical occurrences. Grip strength in both hands is full.  Able to move all 4 limbs. No tremor, no contracture deformities.  Skin:  No telangiectasias, no sores. Tattoos:  None   Psych:  Pleasant, comfortable. A little bit anxious.  Intake/Output from previous day: 01/11 0701 - 01/12 0700 In: 1620 [P.O.:720; I.V.:20; NG/GT:880] Out: 1950 [Urine:1950] Intake/Output this shift: Total I/O In: 30 [I.V.:30] Out: -   LAB RESULTS:  Recent Labs  02/25/14 1648 02/26/14 0545 02/27/14 0925  WBC 30.7* 30.9* 21.5*  HGB 11.9* 12.3 11.1*  HCT 39.7 40.3 37.2  PLT 339 325 270   BMET Lab Results  Component Value Date   NA 145 02/26/2014   NA 143 02/24/2014   NA 141 02/23/2014   K 3.8 02/26/2014   K 4.6 02/24/2014   K 4.3 02/23/2014   CL 105 02/26/2014   CL 108 02/24/2014   CL 105 02/23/2014   CO2 31 02/26/2014   CO2 28 02/24/2014   CO2 24 02/23/2014   GLUCOSE 181* 02/26/2014   GLUCOSE 165* 02/24/2014   GLUCOSE 141* 02/23/2014   BUN 77* 02/26/2014   BUN 68* 02/24/2014   BUN 53* 02/23/2014   CREATININE 1.11* 02/26/2014   CREATININE 1.07 02/24/2014   CREATININE 0.97 02/23/2014   CALCIUM 9.2 02/26/2014   CALCIUM 9.6 02/24/2014   CALCIUM 9.4 02/23/2014   LFT No results for input(s): PROT, ALBUMIN, AST, ALT, ALKPHOS, BILITOT, BILIDIR, IBILI in the last 72 hours. PT/INR Lab Results  Component Value Date   INR 1.78* 02/14/2014   INR 1.30 01/30/2014   INR 1.18 01/29/2014    RADIOLOGY STUDIES: No results found.  ENDOSCOPIC STUDIES: Colonoscopy in [place] about 3 or 4 years ago. Findings unknown.  No previous upper endoscopy  IMPRESSION:   *  Upper GI bleed. Rule out ulcer, rule out G-tube associated bleed. Rule out AVM  *  CVA. On Eliquis which is currently on hold. Last dose was on 02/26/13 at 2100  *  Respiratory failure post CVA, status post  trach.    PLAN:     *  Proceed to EGD once Eliquis has washed out. This washout occurs in 24-48 hours from last dose. egd set for 11AM 1/13.   *  Continue twice daily Protonix via tube.   Azucena Freed  02/27/2014, 10:32 AM Pager: [phone]  GI Attending Note   Chart was reviewed and patient was examined. X-rays and lab were reviewed.    I agree with management and plans.  Sandy Salaam. Deatra Ina, M.D., [facility] Cell [phone]

## 2014-02-27 NOTE — Progress Notes (Signed)
    Interval: We down-sized trach to #6 on 1/7. She has tolerated this very well and has excellent Phonation with PMV.   Subjective No acute distress.   Objective BP 107/52 mmHg  Pulse 119  Temp(Src) 97.9 F (36.6 C) (Oral)  Resp 24  Ht 5\' 2"  (1.575 m)  Wt 68 kg (149 lb 14.6 oz)  BMI 27.41 kg/m2  SpO2 96%  Currently on Room air  HENT: #6 trach, cuffless. Has excellent phonation w/ trach occlusion by finger. Strong cough, no desaturation and distress Pulm: Clear, no accessory muscle use.  Card: RRR Abd: soft, non-tender + bowel sounds.  GU: voids Neuro: awake, impulsive. Speech clear.    Recent Labs Lab 02/23/14 0420 02/24/14 0530 02/26/14 0545  NA 141 143 145  K 4.3 4.6 3.8  CL 105 108 105  CO2 24 28 31   BUN 53* 68* 77*  CREATININE 0.97 1.07 1.11*  GLUCOSE 141* 165* 181*    Recent Labs Lab 02/25/14 1648 02/26/14 0545 02/27/14 0925  HGB 11.9* 12.3 11.1*  HCT 39.7 40.3 37.2  WBC 30.7* 30.9* 21.5*  PLT 339 325 270    Impression/Plan  Tracheostomy Status s/p CVA: she has excellent phonation, strong cough and no distress w/ trach occluded.  >has done well w/ capping trials.  Plan  Will d/w GI, she can be decannulated at any time. If GI feels that she needs airway for EGD, then would rather keep trach; switch briefly to cuffed (stoma appears as though it would accept this easily) and then decannulate. If Not then we will decannulate today.    UGIB Plan Add PPI BID.   Erick Colace ACNP-BC [place] Pager # [phone] OR # [phone] if no answer   Erick Colace, NP [phone]  Reviewed above, and examined.  She is more alert, and has reasonable cough.  She wants something to drink.  Scattered rhonchi that clear with coughing.  D/w Dr. Deatra Ina >> minimal concern for aspiration when she has EGD later this week.  Will proceed with tracheostomy decannulation today >> monitor in SDU for 24 hours after decannulation.    Updated pt's  family at bedside.  Chesley Mires, MD [facility] Pulmonary/Critical Care 02/27/2014, 11:11 AM Pager:  [phone] After 3pm call: [phone]

## 2014-02-27 NOTE — Consult Note (Signed)
Patient was seen and examined.  History reviewed.  Full note to follow.  She has had melena in the face of anticoagulation therapy.  BUN is elevated suggesting a proximal GI bleeding source.  I agree with the need to determine source for GI bleeding in the face of anticipated long-term anticoagulation therapy.  Plan to do EGD in 48 hours allowing Eliquis to wash out of her system.  Since pulmonary status is stable it is okay to pull the tracheostomy tube.  Continue PPI therapy.

## 2014-02-27 NOTE — Progress Notes (Signed)
STROKE TEAM PROGRESS NOTE   SUBJECTIVE (INTERVAL HISTORY):  Daughter is at bedside.  . Remains afebrile but WBC high at 30.000. Had one black bowel movement y`day hence Eliquis was held  Again but repeat hematocrit is pending Spoke to daughter at length about high risk situation with patient clearly having had 2 embolic strokes within 1 week requiring mechanical intervention and now having black tarry stools on eliquis. We do not have any choice but to stop the Eliquis and get gastroenterology consult to find the source of the bleed. Patient will need to be on long-term anticoagulation and this would be risky without finding the source and fixing it OBJECTIVE Temp:  [97.5 F (36.4 C)-97.9 F (36.6 C)] 97.9 F (36.6 C) (01/12 0800) Pulse Rate:  [70-119] 119 (01/12 0935) Cardiac Rhythm:  [-] Atrial flutter (01/12 0845) Resp:  [16-35] 24 (01/12 0935) BP: (77-119)/(45-92) 108/63 mmHg (01/12 1301) SpO2:  [92 %-100 %] 96 % (01/12 0935)   Recent Labs Lab 02/21/14 1220 02/21/14 1632 02/22/14 0759 02/22/14 1224 02/22/14 1608  GLUCAP 167* 149* 126* 155* 122*    Recent Labs Lab 02/21/14 0422 02/23/14 0420 02/24/14 0530 02/26/14 0545  NA 140 141 143 145  K 3.8 4.3 4.6 3.8  CL 101 105 108 105  CO2 32 24 28 31   GLUCOSE 119* 141* 165* 181*  BUN 27* 53* 68* 77*  CREATININE 0.58 0.97 1.07 1.11*  CALCIUM 9.0 9.4 9.6 9.2  MG 2.1  --   --   --   PHOS 3.2  --   --   --    No results for input(s): AST, ALT, ALKPHOS, BILITOT, PROT, ALBUMIN in the last 168 hours.  Recent Labs Lab 02/21/14 0422 02/23/14 0420 02/24/14 0530 02/25/14 1648 02/26/14 0545 02/27/14 0925  WBC 18.5* 25.2* 27.0* 30.7* 30.9* 21.5*  NEUTROABS 16.0*  --   --   --  27.5*  --   HGB 10.9* 12.1 12.3 11.9* 12.3 11.1*  HCT 36.1 40.6 41.6 39.7 40.3 37.2  MCV 81.7 79.5 80.3 80.0 81.9 81.9  PLT 379 366 369 339 325 270   No results for input(s): CKTOTAL, CKMB, CKMBINDEX, TROPONINI in the last 168 hours. No results for  input(s): LABPROT, INR in the last 72 hours. No results for input(s): COLORURINE, LABSPEC, [place], GLUCOSEU, HGBUR, BILIRUBINUR, KETONESUR, PROTEINUR, UROBILINOGEN, NITRITE, LEUKOCYTESUR in the last 72 hours.  Invalid input(s): APPERANCEUR     Component Value Date/Time   CHOL 114 01/30/2014 0337   TRIG 102 01/30/2014 0337   HDL 24* 01/30/2014 0337   CHOLHDL 4.8 01/30/2014 0337   VLDL 20 01/30/2014 0337   LDLCALC 70 01/30/2014 0337   Lab Results  Component Value Date   HGBA1C 6.4* 01/30/2014      Component Value Date/Time   LABOPIA NONE DETECTED 01/29/2014 0659   COCAINSCRNUR NONE DETECTED 01/29/2014 0659   LABBENZ NONE DETECTED 01/29/2014 0659   AMPHETMU NONE DETECTED 01/29/2014 0659   THCU NONE DETECTED 01/29/2014 0659   LABBARB NONE DETECTED 01/29/2014 0659    No results for input(s): ETH in the last 168 hours.  I have personally reviewed the radiological images below and agree with the radiology interpretations.  Ct Head Wo Contrast  02/01/2014  Stat CT scan shows no acute changes in the brain, but there appears to be a thrombus in the basilar artery that is new.  01/30/2014    1. Resolved right MCA territory hyperdensity present at 1056 hrs yesterday, most likely was post Neurointervention  parenchymal contrast staining. 2. Mild cytotoxic edema suspected in that same right MCA territory. Underlying chronic ischemic changes. 3. Stable parafalcine subdural hematoma. No new intracranial hemorrhage or acute mass effect. 4. No new intracranial abnormality.    01/29/2014    Acute infarct right MCA territory as noted on CT perfusion study. There is interval development of high density in the right temporoparietal cortex which may be due to contrast material or hemorrhage. Continued follow-up recommended.  Small interhemispheric subdural hematoma is unchanged.    01/29/2014   1. 4 mm thick posterior falx subdural hemorrhage. 2. Prominent density at the right M1/M2 junction. CTA is  already planned. 3. Established infarcts in the anterior and posterior right MCA territory. No definitive acute infarct. 4. Left frontal scalp contusion.  No acute fracture.    Ct Cerebral Perfusion W/cm 01/29/2014    1. Occlusive thrombus at the right M1-M2 junction. Subjectively good pial/pial collateral circulation. 2. Perfusion findings suggest moderate acute infarct in the central right MCA territory with rim of penumbra, as above. 3. Remote infarcts at the anterior and posterior margins of the right MCA territory. 4. Thin posterior falx subdural hemorrhage.     Cerebral Angiogram  01/29/2014 S/P Bilateral common carotid arteriograms, followed by complete revascularization of RT MCA M1 occlusion usingX2 passes with the Solitaire FR 19mm x 47mm retrieval device And 10 mg of  superselective intracranial IA Integrelin. TICI 3 flow reesablished.   MRI brain  02/02/14 1. New, acute infarcts in the posterior circulation related to treated basilar thrombosis. Infarcts present in the bilateral cerebellum, upper right brainstem, mesial right temporal lobe, and minimally along the right occipital cortex. Normal flow voids within the intracranial vessels. 2. Stable appearance of recent right MCA territory infarct. 3. Stable trace falcine subdural hemorrhage.  01/31/14 1. Acute nonhemorrhagic infarct involving the superior right temporal lobe, right insular ribbon, right frontal operculum, and posterior right frontal lobe. 2. More remote infarcts are again noted in the more anterior inferior right frontal lobe and the right parietal lobe. 3. Extensive white matter changes bilaterally likely reflect the sequela of chronic microvascular ischemia, advanced for age. 4. White matter changes extend into the brainstem. 5. Remote tender infarcts of the cerebellum bilaterally, worse on the right.  Dg Chest Port 1 View 02/08/2014    1. Right PICC line stable position. 2. Congestive heart failure with  interstitial edema and small right pleural effusion. Mild improvement from prior exam. 02/10/2014 Improved bibasilar atelectasis versus airspace disease. 02/13/2014 - No pneumothorax status post bilateral chest tube placement. Stable support apparatus 02/19/2014- Resolved left basilar pneumothorax. Bilateral hazy airspace disease not significantly changed  2D Echocardiogram  EF 50-55% with no source of embolus.   A1C 6.4 and LDL 70   Physical Exam General: The patient is awake, alert and following simple commands. S/p trach and peg.  Respiratory: decrease breath sounds at the bilateral bases with rhonchi and wheezing Cardiovascular: irregular rate and rhythm, no murmurs noted Abdomen: Soft. Peg tube Skin: No significant peripheral edema is noted. Neurologic Exam Mental status: The patient is awake alert cooperative, follows simple commands.  Cranial nerves: Pupils are equal, reactive to light. Difficulty with upgaze. Blnk to threat in all quadrants, VFF. Mild left facial asymmetry. horase voice but can be understood. Motor: mild weakness left arm, left arm drift. Bilateral Hip flexion mild weakness. Otherwise intact.  Sensory examination: The patient has decreased sensation on the left arm Coordination: No obvious ataxia seen on the extremities. Difficult to test.  Intact FTN, difficult to test HTS.  Gait and station: Gait was not tested. Reflexes: Deep tendon reflexes are brisk throughout.  ASSESSMENT/PLAN Ms. Summer Bradley is a 69 y.o. female with history of hypertension, stroke, afib and COPD presenting with Left-sided weakness. She did not receive IV t-PA due to delay in arrival and ? SDH. But underwent mechanical thrombectomy of right MCA with complete vascularization of right MCA M1 occlusion with IA Integrilin. 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 Dr. Estanislado Pandy was able to extract the clot, and the patient has sustained a right  pontine and midbrain stroke, small bilateral cerebellar strokes, and some extension of the infarct in the mesial temporal area on the right. S/p trach and peg   Stroke:  R MCA infarct secondary to R MCA occlusion s/p revascularization with mechanical thrombectomy and IA integrelin. 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 Infarcts embolic secondary to Afib with subtherapeutic INR on coumadin.  Resultant left arm weakness and left facial droop  2D Echo  EF 50-55% no source of embolus  HgbA1c 6.4  eliquis po for VTE prophylaxis  aspirin 81 mg orally every day and warfarin prior to admission, changed to eliquis for stroke prevention. Resume eliquis.   Neurologically stable   PT/OT on board and recommend SNF  Deposition - SNF tomorrow after off sitter for 24 hours.    Acute Respiratory Failure / COPD exacerbation   Intubated for neuro intervention-extubated 02/06/14  Tolerated well 24 hours post extubation.  Once to the floor 12/23, overnight developed more respiratory distress and concerning for aspiration  12/24 Transferred to step down unit for further monitoring  Developed the respiratory distress again on 02/13/2014, intubated and transferred to neuro ICU  Trach done on 02/15/14. Off vent now.  unasyn discontinued  On Xopenex and mucomyst neb treatment  On steroids tapering  Suction PRN  Atrial Fibrillation / A flutter with AV block  Home meds:  Warfarin  INR subtherapeutic on admission, INR 1.18   started Cardizem and s/p d/c amiodarone.   Rate controlled.  On eliquis for stroke prevention  Dysphagia  Aspiration pneumonia  Developed a respiratory distress again on 02/13/2014, intubated and transferred to neuro ICU. Concerning for aspiration again.   PEG done 02/15/14. TF restarted  Speech following  Positive stool occult blood test   No  black stool   No frank blood in stool  Hb improved than before  Positive test likely due to her recent PEG procedure  Will resume eliquis  Close monitoring  Sun-downing  d/c sitter  d/c restraint  tapered off clonazepam  low dose seroquel   Hypertension  Continue to monitor  BP stable 120-140  On bisoprolol  Hyperlipidemia  Home meds:  Mevacor  LDL 70, goal < 70  On pravastatin  Continue statin on d/c  Trush  Fluconazole for 14 days  Started on 02/10/2014  Other Stroke Risk Factors  Advanced age  Obesity, Body mass index is 27.41 kg/(m^2).   Hx stroke/TIA  Other Active Problems  Depression, on chronic SSRI  Anemia - improved  Leukocytosis - on solumedrol - afebrile 18.2->20.5->21.3->24.1->18.5 Plan  DC trach today per PCCM and observe 24 hours Start PPI and GI consult for endoscopy for source of bleed. Hold eliquis for next few days till source dtermined. Lond d/w daughter, patient and Dr Deatra Ina about about high risk situation and risk benefit of anticoagulation versus risk  of recurrent stroke. Hospital day # [place], MD Stroke Neurology 02/27/2014 1:04 PM    To contact Stroke Continuity provider, please refer to [email or web address] After hours, contact General Neurology

## 2014-02-27 NOTE — Progress Notes (Signed)
[place] for Apixaban Indication: atrial fibrillation  Allergies  Allergen Reactions  . Lisinopril     Unknown reaction    Patient Measurements: Height: 5\' 2"  (157.5 cm) Weight: 149 lb 14.6 oz (68 kg) (took equipment off bed) IBW/kg (Calculated) : 50.1  Vital Signs: Temp: 97.9 F (36.6 C) (01/12 0800) Temp Source: Oral (01/12 0800) BP: 107/52 mmHg (01/12 0935) Pulse Rate: 119 (01/12 0935)  Labs:  Recent Labs  02/25/14 1648 02/26/14 0545 02/27/14 0925  HGB 11.9* 12.3 11.1*  HCT 39.7 40.3 37.2  PLT 339 325 270  CREATININE  --  1.11*  --     Estimated Creatinine Clearance: 43.3 mL/min (by C-G formula based on Cr of 1.11).   Medical History: Past Medical History  Diagnosis Date  . Hypertension   . Stroke   . A-fib   . COPD (chronic obstructive pulmonary disease)     Medications:  Scheduled:  . antiseptic oral rinse  7 mL Mouth Rinse QID  . apixaban  5 mg Oral BID  . bisoprolol  2.5 mg Oral BID  . chlorhexidine  15 mL Mouth Rinse BID  . citalopram  20 mg Per Tube QHS  . diltiazem  60 mg Oral 4 times per day  . feeding supplement (PRO-STAT SUGAR FREE 64)  30 mL Per Tube BID  . furosemide  40 mg Intravenous Q12H  . ipratropium  0.5 mg Nebulization TID  . levalbuterol  0.63 mg Nebulization TID  . levothyroxine  150 mcg Per Tube QAC breakfast  . pantoprazole sodium  40 mg Per Tube Daily  . pravastatin  20 mg Per Tube q1800  . predniSONE  10 mg Oral Q breakfast   Followed by  . [START ON 02/28/2014] predniSONE  5 mg Oral Q breakfast  . QUEtiapine  12.5 mg Oral QHS  . sodium chloride  10-40 mL Intracatheter Q12H    Assessment: 69 y.o. Female continuing on apixaban for Afib. Dose remains appropriate and no overt bleeding noted.   Goal of Therapy:  Prevention of stroke Monitor platelets by anticoagulation protocol: Yes   Plan:  -Continue Apixaban 5mg  po BID -Monitor S&S of bleeding, renal fxn  *Pharmacy will  sign-off and follow peripherally. Please re-consult [place] if need. Thank you for the consult!  Salome Arnt, PharmD, BCPS Pager # [phone] 02/27/2014 10:22 AM

## 2014-02-27 NOTE — Progress Notes (Signed)
NUTRITION FOLLOW-UP  DOCUMENTATION CODES Per approved criteria  -Obesity Unspecified   INTERVENTION: - Continue Vital AF 1.2 via PEG at 40 ml/hr with 30 ml Prostat BID per tube to provide 1352 kcal, 102 grams of protein, and 778 ml of free water.   -Encourage PO intake.  - RD will continue to monitor for nutrition care plan.   NUTRITION DIAGNOSIS: Inadequate oral intake related to inability to eat as evidenced by NPO status; just advanced to dysphagia 1 diet; ongoing.   Goal: Pt to meet >/= 90% of their estimated nutrition needs; met  Monitor:  PO intake, TF tolerance, weight trends, labs  ASSESSMENT: Acute R MCA CVA, s/p angiography, R MCA clot retrieval 12/14 with new stroke 12/17 and back to IR intervention.  12/22: Pt extubated.  12/23: FEES done- SLP recommend dysphagia 1 with honey thick liquids. 12/29: Pt bradycardic/unresponsive- code blue called. Pt re-intubated. Pt with clinically significant pneumothorax. Chest tube placed. 12/30: right chest tube removed 12/31: s/p trach, PEG placed 1/2: Left chest tube removed.   - Pt currently with trach collar. Plans for SNF. - Currently tolerating TF via PEG @ goal rate of VAF @ 40 ml/hr with Prostat BID to provide 1352 kcal (75% of estimated minimum kcal needs), 102 grams of protein (100% of estimated protein needs),. Pt has been advanced to a dysphagia 1 diet with honey thick liquids. Meal completion has been varied from 0-50%. Will continue with tube feeding.   Labs: Low GFR. High BUN and creatinine.  Height: Ht Readings from Last 1 Encounters:  02/08/14 5' 2"  (1.575 m)    Weight: Wt Readings from Last 1 Encounters:  02/26/14 149 lb 14.6 oz (68 kg)  Admission weight: 192 lb (87.2 kg) 12/14  BMI:  Body mass index is 27.41 kg/(m^2).   Re-Estimated Nutritional Needs: Kcal: 1800-2000 Protein: >/= 100 grams Fluid: >/ 1.8 L/day  Skin: incision, laceration  Diet Order: DIET - DYS 1 with honey  thick   Intake/Output Summary (Last 24 hours) at 02/27/14 1027 Last data filed at 02/27/14 0925  Gross per 24 hour  Intake   1010 ml  Output   1950 ml  Net   -940 ml    Last BM: 1/12- dark stools  Labs:   Recent Labs Lab 02/21/14 0422 02/23/14 0420 02/24/14 0530 02/26/14 0545  NA 140 141 143 145  K 3.8 4.3 4.6 3.8  CL 101 105 108 105  CO2 32 24 28 31   BUN 27* 53* 68* 77*  CREATININE 0.58 0.97 1.07 1.11*  CALCIUM 9.0 9.4 9.6 9.2  MG 2.1  --   --   --   PHOS 3.2  --   --   --   GLUCOSE 119* 141* 165* 181*    CBG (last 3)  No results for input(s): GLUCAP in the last 72 hours.  Scheduled Meds: . antiseptic oral rinse  7 mL Mouth Rinse QID  . apixaban  5 mg Oral BID  . bisoprolol  2.5 mg Oral BID  . chlorhexidine  15 mL Mouth Rinse BID  . citalopram  20 mg Per Tube QHS  . diltiazem  60 mg Oral 4 times per day  . feeding supplement (PRO-STAT SUGAR FREE 64)  30 mL Per Tube BID  . furosemide  40 mg Intravenous Q12H  . ipratropium  0.5 mg Nebulization TID  . levalbuterol  0.63 mg Nebulization TID  . levothyroxine  150 mcg Per Tube QAC breakfast  . pantoprazole sodium  40 mg Per Tube Daily  . pravastatin  20 mg Per Tube q1800  . predniSONE  10 mg Oral Q breakfast   Followed by  . [START ON 02/28/2014] predniSONE  5 mg Oral Q breakfast  . QUEtiapine  12.5 mg Oral QHS  . sodium chloride  10-40 mL Intracatheter Q12H    Continuous Infusions: . feeding supplement (VITAL AF 1.2 CAL) 1,000 mL (02/26/14 0800)    Kallie Locks, MS, RD, LDN Pager # [phone] After hours/ weekend pager # [phone]

## 2014-02-28 MED ORDER — RESOURCE THICKENUP CLEAR PO POWD
ORAL | Status: DC | PRN
  Filled 2014-02-28 (×3): qty 125

## 2014-02-28 MED ORDER — SODIUM CHLORIDE 0.9 % IV SOLN
INTRAVENOUS | Status: DC
  Administered 2014-02-28: 18:00:00 via INTRAVENOUS

## 2014-02-28 NOTE — Progress Notes (Signed)
    Interval: Now decannulated   Subjective No acute distress.   Objective BP 99/67 mmHg  Pulse 82  Temp(Src) 97.8 F (36.6 C) (Oral)  Resp 16  Ht 5\' 2"  (1.575 m)  Wt 68 kg (149 lb 14.6 oz)  BMI 27.41 kg/m2  SpO2 96%  Currently on Room air  HENT: occlusive dressing in place. Good cough and phonation no desaturation or distress Pulm: Clear, no accessory muscle use.  Card: RRR Abd: soft, non-tender + bowel sounds.  GU: voids Neuro: awake, impulsive. Speech clear.    Recent Labs Lab 02/23/14 0420 02/24/14 0530 02/26/14 0545  NA 141 143 145  K 4.3 4.6 3.8  CL 105 108 105  CO2 24 28 31   BUN 53* 68* 77*  CREATININE 0.97 1.07 1.11*  GLUCOSE 141* 165* 181*    Recent Labs Lab 02/25/14 1648 02/26/14 0545 02/27/14 0925  HGB 11.9* 12.3 11.1*  HCT 39.7 40.3 37.2  WBC 30.7* 30.9* 21.5*  PLT 339 325 270    Impression/Plan  Tracheostomy Status s/p CVA: she has excellent phonation, strong cough and no distress w/ trach occluded.  >decannulated 1/12 Plan Pulm hygiene    UGIB Plan Added PPI BID.  For EGD tomorrow    PCCM will s/o and be available as needed.    Erick Colace ACNP-BC [place] Pager # [phone] OR # [phone] if no answer  Decannulated and doing well.  Swallow evaluation noted.  Awaiting placement.  Continue pulmonary hygienes.  PCCM will sign off, please call back if needed.  Patient seen and examined, agree with above note.  I dictated the care and orders written for this patient under my direction.  Rush Farmer, MD [phone]

## 2014-02-28 NOTE — Clinical Social Work Note (Addendum)
Familys first bed choice- [facility]- has made bed offer and is starting insurance authorization.   Patients daughter, Janace Hoard, was made aware of bed offer and is very happy that [facility] will be able to take the patient.   CSW will continue to follow.  [name], [place] Social Worker [phone]

## 2014-02-28 NOTE — Evaluation (Signed)
Occupational Therapy Evaluation Patient Details Name: Summer Bradley MRN: [id number] DOB: 11-22-1945 Today's Date: 02/28/2014    History of Present Illness pt presents post R MCA clot retrieval with revascularization.  pt intubated 12/14-15, 12/17-22, and re-intubated 12/29, extubated12/30, trach placed 12/31, decannulated on 01/12.    Clinical Impression   Pt presents today with lethargy, required 2 person assist to EOB and became somewhat more aroused. Pt without specific complaints.  Noted to have vtach x 1 minute once transferred to chair. 02 sats remained in the mid 90s, HR low 100s.   Follow Up Recommendations  SNF;Supervision/Assistance - 24 hour    Equipment Recommendations       Recommendations for Other Services       Precautions / Restrictions Precautions Precautions: Fall Precaution Comments: watch vitals Restrictions Weight Bearing Restrictions: No      Mobility Bed Mobility Overal bed mobility: Needs Assistance Bed Mobility: Supine to Sit     Supine to sit: +2 for physical assistance;Total assist Sit to supine:  (able to put herself back to bed x2, stating 'not in the mood)   General bed mobility comments: Pt requiring more assist with bed mobility due to level of arousal. Repeatedly attempting to lay back down.  Transfers Overall transfer level: Needs assistance Equipment used: 1 person hand held assist Transfers: Sit to/from Stand Sit to Stand: Min assist;+2 physical assistance         General transfer comment: Pt requires cues for hand placement.  She stands with min A +2, but requires mod A +2 to pivot as she requires assist for balance, and to advance Lt.     Balance     Sitting balance-Leahy Scale: Fair       Standing balance-Leahy Scale: Poor                              ADL Overall ADL's : Needs assistance/impaired   Eating/Feeding Details (indicate cue type and reason): deferred eating due to lethargy Grooming: Wash/dry  face;Moderate assistance;Sitting Grooming Details (indicate cue type and reason): decreased thoroughness                                     Vision                     Perception     Praxis      Pertinent Vitals/Pain Pain Assessment: No/denies pain     Hand Dominance     Extremity/Trunk Assessment             Communication     Cognition Arousal/Alertness: Lethargic Behavior During Therapy: Flat affect Overall Cognitive Status: Impaired/Different from baseline Area of Impairment: Attention Orientation Level: Disoriented to;Place Current Attention Level: Selective Memory: Decreased short-term memory;Decreased recall of precautions Following Commands: Follows one step commands consistently     Problem Solving: Slow processing;Decreased initiation;Difficulty sequencing;Requires verbal cues;Requires tactile cues     General Comments       Exercises       Shoulder Instructions      Home Living                                          Prior Functioning/Environment  OT Diagnosis:     OT Problem List:     OT Treatment/Interventions:      OT Goals(Current goals can be found in the care plan section) Acute Rehab OT Goals Patient Stated Goal: to go home Time For Goal Achievement: 03/07/14  OT Frequency: Min 2X/week   Barriers to D/C:            Co-evaluation PT/OT/SLP Co-Evaluation/Treatment: Yes Reason for Co-Treatment: Complexity of the patient's impairments (multi-system involvement);For patient/therapist safety PT goals addressed during session: Mobility/safety with mobility OT goals addressed during session: ADL's and self-care      End of Session Equipment Utilized During Treatment: Gait belt Nurse Communication:  (RT in room at time of vtach)  Activity Tolerance: Patient limited by lethargy Patient left: in chair;with call bell/phone within reach;with chair alarm set   Time:  [phone] OT Time Calculation (min): 32 min Charges:  OT General Charges $OT Visit: 1 Procedure OT Treatments $Self Care/Home Management : 8-22 mins G-Codes:    Malka So 02/28/2014, 11:08 AM  [phone]

## 2014-02-28 NOTE — Progress Notes (Signed)
Speech Language Pathology Treatment: Dysphagia  Patient Details Name: Summer Bradley MRN: [id number] DOB: Jul 30, 1945 Today's Date: 02/28/2014 Time: [id number] SLP Time Calculation (min) (ACUTE ONLY): 19 min  Assessment / Plan / Recommendation Clinical Impression  Tyrone needed moderate verbal and tactile assist for decreased size. Labial residue with decreased awareness/sensation. No evidence of poor airway protection. Pt having endoscopy 1/14. ST will follow up Fri for possible repeat MBS for ability to upgrade diet/liquids.   HPI HPI: 69 yo developed neuro changes 12/14/15with left side weakness that resulted in a fall. Found to have right MCA clot, taken to IR for removal and revascularization procedure. 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 Functionally, these deficits manifested as left buccal residue, reduced oral manipulation and control, anterior spillage, and wet phonation and cough associated with ice chips/thin liquids. Pt advanced to dysphagia 2, thin liquids on 12/16. 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 Reintubated 12/17-12/22. Revascularization 12/17. 12/18 MRI reveals new multi focal infarcts in the posterior circulation, affecting the mesial right temporal lobe, right occipital cortex, bilateral mid and upper cerebellum and right upper pons and midbrain. FEES 12/23 with recs to begin dysphagia 1, honey-thick liquids. Pt subsequently with respiratory decline, made NPO. Repeat BSE 12/25 recommended resume Dys 1, honey liquids. SLP treatment session with breakfast 12/29 with max cues with feeding due to impulsivity, however no evidence of aspiraiton.  with reintubation 12/23 pm.One hour following session pt developed airway plugging, decreased responsiveness was reintubated 12/29 and received trach 12/31, tolerating trach collar and PMSV  ordered.   Pertinent Vitals Pain Assessment: No/denies pain  SLP Plan  Continue with current plan of care (repeat MBS after endoscopy for possible upgrade)    Recommendations Diet recommendations: Dysphagia 1 (puree);Honey-thick liquid Liquids provided via: Cup;No straw Medication Administration: Crushed with puree Supervision: Patient able to self feed;Full supervision/cueing for compensatory strategies;Staff to assist with self feeding Compensations: Slow rate;Small sips/bites;Check for pocketing;Check for anterior loss Postural Changes and/or Swallow Maneuvers: Seated upright 90 degrees              Oral Care Recommendations: Oral care BID Follow up Recommendations:  (depends on progress; likely SNF) Plan: Continue with current plan of care (repeat MBS after endoscopy for possible upgrade)    GO     [name] 02/28/2014, 3:18 PM  Orbie Pyo Colvin Caroli.Ed [employer] [phone]

## 2014-02-28 NOTE — Progress Notes (Signed)
STROKE TEAM PROGRESS NOTE   SUBJECTIVE (INTERVAL HISTORY):   Trach Dc y`day tolerated well so far. Eliquis on hold for endoscopy tomorrow. No fevers. WBC coming down 21 today. HCt 37   No. More black stools today OBJECTIVE Temp:  [97.4 F (36.3 C)-98.5 F (36.9 C)] 97.4 F (36.3 C) (01/13 1204) Pulse Rate:  [82-123] 83 (01/13 1204) Cardiac Rhythm:  [-] Atrial flutter (01/13 0925) Resp:  [16-27] 22 (01/13 1204) BP: (80-121)/(57-89) 97/68 mmHg (01/13 1204) SpO2:  [87 %-100 %] 100 % (01/13 1204)   Recent Labs Lab 02/21/14 1632 02/22/14 0759 02/22/14 1224 02/22/14 1608  GLUCAP 149* 126* 155* 122*    Recent Labs Lab 02/23/14 0420 02/24/14 0530 02/26/14 0545  NA 141 143 145  K 4.3 4.6 3.8  CL 105 108 105  CO2 24 28 31   GLUCOSE 141* 165* 181*  BUN 53* 68* 77*  CREATININE 0.97 1.07 1.11*  CALCIUM 9.4 9.6 9.2   No results for input(s): AST, ALT, ALKPHOS, BILITOT, PROT, ALBUMIN in the last 168 hours.  Recent Labs Lab 02/23/14 0420 02/24/14 0530 02/25/14 1648 02/26/14 0545 02/27/14 0925  WBC 25.2* 27.0* 30.7* 30.9* 21.5*  NEUTROABS  --   --   --  27.5*  --   HGB 12.1 12.3 11.9* 12.3 11.1*  HCT 40.6 41.6 39.7 40.3 37.2  MCV 79.5 80.3 80.0 81.9 81.9  PLT 366 369 339 325 270   No results for input(s): CKTOTAL, CKMB, CKMBINDEX, TROPONINI in the last 168 hours. No results for input(s): LABPROT, INR in the last 72 hours. No results for input(s): COLORURINE, LABSPEC, [place], GLUCOSEU, HGBUR, BILIRUBINUR, KETONESUR, PROTEINUR, UROBILINOGEN, NITRITE, LEUKOCYTESUR in the last 72 hours.  Invalid input(s): APPERANCEUR     Component Value Date/Time   CHOL 114 01/30/2014 0337   TRIG 102 01/30/2014 0337   HDL 24* 01/30/2014 0337   CHOLHDL 4.8 01/30/2014 0337   VLDL 20 01/30/2014 0337   LDLCALC 70 01/30/2014 0337   Lab Results  Component Value Date   HGBA1C 6.4* 01/30/2014      Component Value Date/Time   LABOPIA NONE DETECTED 01/29/2014 0659   COCAINSCRNUR NONE  DETECTED 01/29/2014 0659   LABBENZ NONE DETECTED 01/29/2014 0659   AMPHETMU NONE DETECTED 01/29/2014 0659   THCU NONE DETECTED 01/29/2014 0659   LABBARB NONE DETECTED 01/29/2014 0659    No results for input(s): ETH in the last 168 hours.  I have personally reviewed the radiological images below and agree with the radiology interpretations.  Ct Head Wo Contrast  02/01/2014  Stat CT scan shows no acute changes in the brain, but there appears to be a thrombus in the basilar artery that is new.  01/30/2014    1. Resolved right MCA territory hyperdensity present at 1056 hrs yesterday, most likely was post Neurointervention parenchymal contrast staining. 2. Mild cytotoxic edema suspected in that same right MCA territory. Underlying chronic ischemic changes. 3. Stable parafalcine subdural hematoma. No new intracranial hemorrhage or acute mass effect. 4. No new intracranial abnormality.    01/29/2014    Acute infarct right MCA territory as noted on CT perfusion study. There is interval development of high density in the right temporoparietal cortex which may be due to contrast material or hemorrhage. Continued follow-up recommended.  Small interhemispheric subdural hematoma is unchanged.    01/29/2014   1. 4 mm thick posterior falx subdural hemorrhage. 2. Prominent density at the right M1/M2 junction. CTA is already planned. 3. Established infarcts in the anterior and  posterior right MCA territory. No definitive acute infarct. 4. Left frontal scalp contusion.  No acute fracture.    Ct Cerebral Perfusion W/cm 01/29/2014    1. Occlusive thrombus at the right M1-M2 junction. Subjectively good pial/pial collateral circulation. 2. Perfusion findings suggest moderate acute infarct in the central right MCA territory with rim of penumbra, as above. 3. Remote infarcts at the anterior and posterior margins of the right MCA territory. 4. Thin posterior falx subdural hemorrhage.     Cerebral Angiogram  01/29/2014  S/P Bilateral common carotid arteriograms, followed by complete revascularization of RT MCA M1 occlusion usingX2 passes with the Solitaire FR 28mm x 47mm retrieval device And 10 mg of  superselective intracranial IA Integrelin. TICI 3 flow reesablished.   MRI brain  02/02/14 1. New, acute infarcts in the posterior circulation related to treated basilar thrombosis. Infarcts present in the bilateral cerebellum, upper right brainstem, mesial right temporal lobe, and minimally along the right occipital cortex. Normal flow voids within the intracranial vessels. 2. Stable appearance of recent right MCA territory infarct. 3. Stable trace falcine subdural hemorrhage.  01/31/14 1. Acute nonhemorrhagic infarct involving the superior right temporal lobe, right insular ribbon, right frontal operculum, and posterior right frontal lobe. 2. More remote infarcts are again noted in the more anterior inferior right frontal lobe and the right parietal lobe. 3. Extensive white matter changes bilaterally likely reflect the sequela of chronic microvascular ischemia, advanced for age. 4. White matter changes extend into the brainstem. 5. Remote tender infarcts of the cerebellum bilaterally, worse on the right.  Dg Chest Port 1 View 02/08/2014    1. Right PICC line stable position. 2. Congestive heart failure with interstitial edema and small right pleural effusion. Mild improvement from prior exam. 02/10/2014 Improved bibasilar atelectasis versus airspace disease. 02/13/2014 - No pneumothorax status post bilateral chest tube placement. Stable support apparatus 02/19/2014- Resolved left basilar pneumothorax. Bilateral hazy airspace disease not significantly changed  2D Echocardiogram  EF 50-55% with no source of embolus.   A1C 6.4 and LDL 70   Physical Exam General: The patient is awake, alert and following simple commands. S/p and peg. Trach discontinued  Respiratory: decrease breath sounds at the  bilateral bases with rhonchi and wheezing Cardiovascular: irregular rate and rhythm, no murmurs noted Abdomen: Soft. Peg tube Skin: No significant peripheral edema is noted. Neurologic Exam Mental status: The patient is awake alert cooperative, follows simple commands.  Cranial nerves: Pupils are equal, reactive to light. Difficulty with upgaze. Blnk to threat in all quadrants, VFF. Mild left facial asymmetry. horase voice but can be understood. Motor: mild weakness left arm, left arm drift. Bilateral Hip flexion mild weakness. Otherwise intact.  Sensory examination: The patient has decreased sensation on the left arm Coordination: No obvious ataxia seen on the extremities. Difficult to test. Intact FTN, difficult to test HTS.  Gait and station: Gait was not tested. Reflexes: Deep tendon reflexes are brisk throughout.  ASSESSMENT/PLAN Ms. Tayllor Breitenstein is a 69 y.o. female with history of hypertension, stroke, afib and COPD presenting with Left-sided weakness. She did not receive IV t-PA due to delay in arrival and ? SDH. But underwent mechanical thrombectomy of right MCA with complete vascularization of right MCA M1 occlusion with IA Integrilin. 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 Dr. Estanislado Pandy was able to extract the clot, and the patient has sustained a right pontine and midbrain stroke, small bilateral cerebellar strokes, and some extension of the infarct in  the mesial temporal area on the right. S/p trach and peg   Stroke:  R MCA infarct secondary to R MCA occlusion s/p revascularization with mechanical thrombectomy and IA integrelin. 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 Infarcts embolic secondary to Afib with subtherapeutic INR on coumadin.  Resultant left arm weakness and left facial droop  2D Echo  EF 50-55% no source of  embolus  HgbA1c 6.4  eliquis po for VTE prophylaxis  aspirin 81 mg orally every day and warfarin prior to admission, changed to eliquis for stroke prevention. Resume eliquis.   Neurologically stable   PT/OT on board and recommend SNF  Deposition - SNF tomorrow after off sitter for 24 hours.    Acute Respiratory Failure / COPD exacerbation   Intubated for neuro intervention-extubated 02/06/14  Tolerated well 24 hours post extubation.  Once to the floor 12/23, overnight developed more respiratory distress and concerning for aspiration  12/24 Transferred to step down unit for further monitoring  Developed the respiratory distress again on 02/13/2014, intubated and transferred to neuro ICU  Trach done on 02/15/14. Off vent now.  unasyn discontinued  On Xopenex and mucomyst neb treatment  On steroids tapering  Suction PRN  Atrial Fibrillation / A flutter with AV block  Home meds:  Warfarin  INR subtherapeutic on admission, INR 1.18   started Cardizem and s/p d/c amiodarone.   Rate controlled.  On eliquis for stroke prevention  Dysphagia  Aspiration pneumonia  Developed a respiratory distress again on 02/13/2014, intubated and transferred to neuro ICU. Concerning for aspiration again.   PEG done 02/15/14. TF restarted  Speech following  Positive stool occult blood test   No black stool   No frank blood in stool  Hb improved than before  Positive test likely due to her recent PEG procedure  Will resume eliquis  Close monitoring  Sun-downing  d/c sitter  d/c restraint  tapered off clonazepam  low dose seroquel   Hypertension  Continue to monitor  BP stable 120-140  On bisoprolol  Hyperlipidemia  Home meds:  Mevacor  LDL 70, goal < 70  On pravastatin  Continue statin on d/c  Trush  Fluconazole for 14 days  Started on 02/10/2014  Other Stroke Risk Factors  Advanced age  Obesity, Body mass index is 27.41 kg/(m^2).    Hx stroke/TIA  Other Active Problems  Depression, on chronic SSRI  Anemia - improved  Leukocytosis - on solumedrol - afebrile 18.2->20.5->21.3->24.1->18.5 Plan    GI consult for endoscopy for source of bleed pending tomorrow. Hold eliquis for next few days till source determined. This is a  high risk situation and risk benefit of anticoagulation versus risk of recurrent stroke. Hospital day # [place], MD Stroke Neurology 02/28/2014 1:43 PM    To contact Stroke Continuity provider, please refer to [email or web address] After hours, contact General Neurology

## 2014-02-28 NOTE — Progress Notes (Signed)
Physical Therapy Treatment Patient Details Name: Summer Bradley MRN: [id number] DOB: Feb 02, 1946 Today's Date: 02/28/2014    History of Present Illness pt presents post R MCA clot retrieval with revascularization.  pt intubated 12/14-15, 12/17-22, and re-intubated 12/29, extubated12/30, trach placed 12/31, decannulated on 01/12.     PT Comments    Patient very lethargic this session, states she's "not in the mood". Patient did tolerated some sitting activities and OOB to chair. OF NOTE: during OOB to chair mobility patient with sustained vtach approx 1 minute.  HR 100-110s after rest, BP 121/57, SpO2 >93%   Follow Up Recommendations  SNF     Equipment Recommendations   (TBD)    Recommendations for Other Services Rehab consult     Precautions / Restrictions Precautions Precautions: Fall Precaution Comments: oxygen sats Restrictions Weight Bearing Restrictions: No    Mobility  Bed Mobility Overal bed mobility: Needs Assistance Bed Mobility: Supine to Sit     Supine to sit: Total assist (extremely lethargic this am, total to come EOB for arousal) Sit to supine:  (able to put herself back to bed x2, stating 'not in the mood)   General bed mobility comments: Patient required total assist to come to EOB this day secondary to lethargy, when assisted EOB, patient able to put her legs back up in the bed and lay down with out assist, feel that ability to come to EOB was limited more by not wanting to get up vs physical inability. When inquired with patient she stated "i'm not in the mood" but after sitting patient EOB, washing face and explaining purpose of OOB to chair patient willing to participate.  Transfers Overall transfer level: Needs assistance Equipment used: 1 person hand held assist Transfers: Sit to/from Stand Sit to Stand: Min assist;+2 physical assistance         General transfer comment: Pt requires cues for hand placement.  She stands with min A +2, but requires mod  A +2 to pivot as she requires assist for balance, and to advance Lt.   Ambulation/Gait Ambulation/Gait assistance: Min assist;Mod assist Ambulation Distance (Feet): 5 Feet Assistive device: 2 person hand held assist Gait Pattern/deviations: Step-to pattern;Shuffle;Trunk flexed     General Gait Details: patient was able to initiate steps forward with assist for upright stability X2    Stairs            Wheelchair Mobility    Modified Rankin (Stroke Patients Only)       Balance     Sitting balance-Leahy Scale: Fair       Standing balance-Leahy Scale: Poor                      Cognition Arousal/Alertness: Lethargic Behavior During Therapy: Flat affect Overall Cognitive Status: Impaired/Different from baseline Area of Impairment: Attention   Current Attention Level: Selective Memory: Decreased short-term memory;Decreased recall of precautions Following Commands: Follows one step commands consistently     Problem Solving: Slow processing;Decreased initiation;Difficulty sequencing;Requires verbal cues;Requires tactile cues      Exercises      General Comments        Pertinent Vitals/Pain Pain Assessment: No/denies pain    Home Living                      Prior Function            PT Goals (current goals can now be found in the care plan section) Acute Rehab PT Goals  Patient Stated Goal: to go home PT Goal Formulation: With patient Time For Goal Achievement: 03/08/14 Potential to Achieve Goals: Good Progress towards PT goals: Progressing toward goals    Frequency  Min 2X/week    PT Plan Current plan remains appropriate;Frequency needs to be updated    Co-evaluation PT/OT/SLP Co-Evaluation/Treatment: Yes Reason for Co-Treatment: Complexity of the patient's impairments (multi-system involvement);For patient/therapist safety PT goals addressed during session: Mobility/safety with mobility       End of Session Equipment  Utilized During Treatment: Oxygen Activity Tolerance: Patient limited by fatigue Patient left: in chair;with call bell/phone within reach;with chair alarm set     Time: [phone] PT Time Calculation (min) (ACUTE ONLY): 29 min  Charges:  $Therapeutic Activity: 8-22 mins                    G CodesDuncan Dull 01-Mar-2014, 9:40 AM Alben Deeds, PT DPT  [phone]

## 2014-02-28 NOTE — Progress Notes (Signed)
Daily Rounding Note  02/28/2014, 9:42 AM  LOS: 30 days   SUBJECTIVE:       No pain or nausea.  Small, dark stool last night was her last.  Asking for ice and water.   OBJECTIVE:         Vital signs in last 24 hours:    Temp:  [97.7 F (36.5 C)-98.5 F (36.9 C)] 97.8 F (36.6 C) (01/13 0445) Pulse Rate:  [82-123] 82 (01/13 0645) Resp:  [12-27] 16 (01/13 0645) BP: (80-118)/(57-89) 99/67 mmHg (01/13 0645) SpO2:  [87 %-100 %] 96 % (01/13 0833) Last BM Date: 02/27/14 Filed Weights   02/24/14 0427 02/25/14 0339 02/26/14 0445  Weight: 171 lb 4.8 oz (77.7 kg) 170 lb 6.7 oz (77.3 kg) 149 lb 14.6 oz (68 kg)   General: looks ill.  Alert and comfortable   Heart: RRR Chest: clear in front Abdomen: soft, NT, ND,  Hernia on right abdomen.  Active BS  Extremities: no CCE Neuro/Psych:  Cooperative, pleasant, follows commands knows place and self but not oriented to time.   Intake/Output from previous day: 01/12 0701 - 01/13 0700 In: 1690 [P.O.:540; I.V.:30; NG/GT:1120] Out: 2075 [Urine:2075]  Intake/Output this shift:    Lab Results:  Recent Labs  02/25/14 1648 02/26/14 0545 02/27/14 0925  WBC 30.7* 30.9* 21.5*  HGB 11.9* 12.3 11.1*  HCT 39.7 40.3 37.2  PLT 339 325 270   BMET  Recent Labs  02/26/14 0545  NA 145  K 3.8  CL 105  CO2 31  GLUCOSE 181*  BUN 77*  CREATININE 1.11*  CALCIUM 9.2   Scheduled Meds: . antiseptic oral rinse  7 mL Mouth Rinse QID  . bisoprolol  2.5 mg Oral BID  . chlorhexidine  15 mL Mouth Rinse BID  . citalopram  20 mg Per Tube QHS  . diltiazem  60 mg Oral 4 times per day  . feeding supplement (PRO-STAT SUGAR FREE 64)  30 mL Per Tube BID  . furosemide  40 mg Intravenous Q12H  . ipratropium  0.5 mg Nebulization TID  . levalbuterol  0.63 mg Nebulization TID  . levothyroxine  150 mcg Per Tube QAC breakfast  . pantoprazole sodium  40 mg Per Tube BID  . pravastatin  20 mg Per Tube  q1800  . predniSONE  5 mg Oral Q breakfast  . QUEtiapine  12.5 mg Oral QHS  . sodium chloride  10-40 mL Intracatheter Q12H   Continuous Infusions: . feeding supplement (VITAL AF 1.2 CAL) 1,000 mL (02/27/14 1054)   PRN Meds:.bisacodyl, guaifenesin, hydrocortisone cream, levalbuterol, metoprolol, midazolam, polyethylene glycol, RESOURCE THICKENUP CLEAR, sodium chloride   ASSESMENT:   * Upper GI bleed. Rule out ulcer, rule out G-tube associated bleed. Rule out AVM.  Bleeding seems to have stopped. Hgb stable.  No transfusions to date.   * CVA. Status post 2 procedures for clot evacuation/revascularization.  Eliquis and all other thinners on hold. Last dose  02/26/13 at 2100  * Respiratory failure post CVA, status post trach.   *  Azotemia and AKI.  Suspect some of the azotemia is due to GI bleed.   PLAN   *  Have had to postpone upper endoscopy until tomorrow at 0800 due to tube feedings not having been discontinued.    Summer Bradley  02/28/2014, 9:42 AM Pager: [phone]  GI Attending Note  I have personally taken an interval history, reviewed the chart, and examined the patient.  I agree with the extender's note, impression and recommendations.  Hemoglobin continues to drift downward.  Plans for EGD tomorrow  Sandy Salaam. Deatra Ina, MD, [facility] [phone]

## 2014-03-01 ENCOUNTER — Inpatient Hospital Stay (HOSPITAL_COMMUNITY): Payer: Medicare HMO | Admitting: Anesthesiology

## 2014-03-01 ENCOUNTER — Encounter (HOSPITAL_COMMUNITY)
Admission: EM | Disposition: A | Discharge status: Skilled Nursing Facility | Payer: Self-pay | Source: Home / Self Care | Attending: Neurology

## 2014-03-01 ENCOUNTER — Encounter (HOSPITAL_COMMUNITY): Payer: Self-pay | Admitting: *Deleted

## 2014-03-01 DIAGNOSIS — D62 Acute posthemorrhagic anemia: Secondary | ICD-10-CM

## 2014-03-01 HISTORY — PX: ESOPHAGOGASTRODUODENOSCOPY: SHX5428

## 2014-03-01 LAB — BASIC METABOLIC PANEL
Anion gap: 10 (ref 5–15)
BUN: 34 mg/dL — ABNORMAL HIGH (ref 6–23)
CO2: 35 mmol/L — ABNORMAL HIGH (ref 19–32)
Calcium: 8.1 mg/dL — ABNORMAL LOW (ref 8.4–10.5)
Chloride: 96 mEq/L (ref 96–112)
Creatinine, Ser: 0.79 mg/dL (ref 0.50–1.10)
GFR calc Af Amer: >90 mL/min (ref >90)
GFR calc non Af Amer: 83 mL/min — ABNORMAL LOW (ref >90)
Glucose, Bld: 139 mg/dL — ABNORMAL HIGH (ref 70–99)
Potassium: 3.1 mmol/L — ABNORMAL LOW (ref 3.5–5.1)
Sodium: 141 mmol/L (ref 135–145)

## 2014-03-01 LAB — CBC
HCT: 30.2 % — ABNORMAL LOW (ref 36.0–46.0)
Hemoglobin: 8.9 g/dL — ABNORMAL LOW (ref 12.0–15.0)
MCH: 23.4 pg — ABNORMAL LOW (ref 26.0–34.0)
MCHC: 29.5 g/dL — ABNORMAL LOW (ref 30.0–36.0)
MCV: 79.3 fL (ref 78.0–100.0)
Platelets: 233 10*3/uL (ref 150–400)
RBC: 3.81 MIL/uL — ABNORMAL LOW (ref 3.87–5.11)
RDW: 20 % — ABNORMAL HIGH (ref 11.5–15.5)
WBC: 14.3 10*3/uL — ABNORMAL HIGH (ref 4.0–10.5)

## 2014-03-01 LAB — PROTIME-INR
INR: 1.29 (ref 0.00–1.49)
Prothrombin Time: 16.2 seconds — ABNORMAL HIGH (ref 11.6–15.2)

## 2014-03-01 LAB — ABO/RH: ABO/RH(D): O POS

## 2014-03-01 LAB — APTT: aPTT: 31 seconds (ref 24–37)

## 2014-03-01 SURGERY — EGD (ESOPHAGOGASTRODUODENOSCOPY)
Anesthesia: Monitor Anesthesia Care

## 2014-03-01 MED ORDER — APIXABAN 5 MG PO TABS
5.0000 mg | ORAL_TABLET | Freq: Two times a day (BID) | ORAL | Status: DC
  Administered 2014-03-01: 5 mg via ORAL
  Filled 2014-03-01 (×2): qty 1

## 2014-03-01 MED ORDER — PROPOFOL INFUSION 10 MG/ML OPTIME
INTRAVENOUS | Status: DC | PRN
  Administered 2014-03-01: 100 ug/kg/min via INTRAVENOUS

## 2014-03-01 MED ORDER — ACETAMINOPHEN 325 MG PO TABS
650.0000 mg | ORAL_TABLET | ORAL | Status: DC | PRN
  Administered 2014-03-04: 650 mg via ORAL
  Filled 2014-03-01: qty 2

## 2014-03-01 MED ORDER — BUTAMBEN-TETRACAINE-BENZOCAINE 2-2-14 % EX AERO
INHALATION_SPRAY | CUTANEOUS | Status: DC | PRN
  Administered 2014-03-01: 2 via TOPICAL
  Administered 2014-03-01: 1 via TOPICAL

## 2014-03-01 MED ORDER — SODIUM CHLORIDE 0.9 % IV BOLUS (SEPSIS)
500.0000 mL | Freq: Once | INTRAVENOUS | Status: AC
  Administered 2014-03-01: 500 mL via INTRAVENOUS

## 2014-03-01 MED ORDER — VITAL AF 1.2 CAL PO LIQD
1000.0000 mL | ORAL | Status: DC
  Administered 2014-03-01 – 2014-03-05 (×3): 1000 mL
  Filled 2014-03-01 (×9): qty 1000

## 2014-03-01 MED ORDER — FENTANYL CITRATE 0.05 MG/ML IJ SOLN
INTRAMUSCULAR | Status: AC
  Filled 2014-03-01: qty 2

## 2014-03-01 MED ORDER — SODIUM CHLORIDE 0.9 % IV SOLN
INTRAVENOUS | Status: DC | PRN
  Administered 2014-03-01: 09:00:00 via INTRAVENOUS

## 2014-03-01 MED ORDER — SODIUM CHLORIDE 0.9 % IV SOLN
INTRAVENOUS | Status: DC
  Administered 2014-03-01 – 2014-03-04 (×4): via INTRAVENOUS

## 2014-03-01 MED ORDER — MIDAZOLAM HCL 5 MG/ML IJ SOLN
INTRAMUSCULAR | Status: AC
  Filled 2014-03-01: qty 1

## 2014-03-01 NOTE — Interval H&P Note (Signed)
History and Physical Interval Note:  03/01/2014 8:50 AM  Summer Bradley  has presented today for surgery, with the diagnosis of Upper GI bleed with melena.  The various methods of treatment have been discussed with the patient and family. After consideration of risks, benefits and other options for treatment, the patient has consented to  Procedure(s): ESOPHAGOGASTRODUODENOSCOPY (EGD) (N/A) as a surgical intervention .  The patient's history has been reviewed, patient examined, no change in status, stable for surgery.  I have reviewed the patient's chart and labs.  Questions were answered to the patient's satisfaction.    The recent H&P (dated *02/28/14**) was reviewed, the patient was examined and there is no change in the patients condition since that H&P was completed.   Erskine Emery  03/01/2014, 8:50 AM    Erskine Emery

## 2014-03-01 NOTE — Progress Notes (Signed)
Upper endoscopy was entirely normal.  No bleeding site was identified. The patient's daughter states that she is seen small amount of blood in her stool.  Note she had a colonoscopy 3-4 years ago. Okay to resume anticoagulation.  Should rectal bleeding worsen may consider sigmoidoscopy or, perhaps, colonoscopy.

## 2014-03-01 NOTE — H&P (View-Only) (Signed)
Daily Rounding Note  02/28/2014, 9:42 AM  LOS: 30 days   SUBJECTIVE:       No pain or nausea.  Small, dark stool last night was her last.  Asking for ice and water.   OBJECTIVE:         Vital signs in last 24 hours:    Temp:  [97.7 F (36.5 C)-98.5 F (36.9 C)] 97.8 F (36.6 C) (01/13 0445) Pulse Rate:  [82-123] 82 (01/13 0645) Resp:  [12-27] 16 (01/13 0645) BP: (80-118)/(57-89) 99/67 mmHg (01/13 0645) SpO2:  [87 %-100 %] 96 % (01/13 0833) Last BM Date: 02/27/14 Filed Weights   02/24/14 0427 02/25/14 0339 02/26/14 0445  Weight: 171 lb 4.8 oz (77.7 kg) 170 lb 6.7 oz (77.3 kg) 149 lb 14.6 oz (68 kg)   General: looks ill.  Alert and comfortable   Heart: RRR Chest: clear in front Abdomen: soft, NT, ND,  Hernia on right abdomen.  Active BS  Extremities: no CCE Neuro/Psych:  Cooperative, pleasant, follows commands knows place and self but not oriented to time.   Intake/Output from previous day: 01/12 0701 - 01/13 0700 In: 1690 [P.O.:540; I.V.:30; NG/GT:1120] Out: 2075 [Urine:2075]  Intake/Output this shift:    Lab Results:  Recent Labs  02/25/14 1648 02/26/14 0545 02/27/14 0925  WBC 30.7* 30.9* 21.5*  HGB 11.9* 12.3 11.1*  HCT 39.7 40.3 37.2  PLT 339 325 270   BMET  Recent Labs  02/26/14 0545  NA 145  K 3.8  CL 105  CO2 31  GLUCOSE 181*  BUN 77*  CREATININE 1.11*  CALCIUM 9.2   Scheduled Meds: . antiseptic oral rinse  7 mL Mouth Rinse QID  . bisoprolol  2.5 mg Oral BID  . chlorhexidine  15 mL Mouth Rinse BID  . citalopram  20 mg Per Tube QHS  . diltiazem  60 mg Oral 4 times per day  . feeding supplement (PRO-STAT SUGAR FREE 64)  30 mL Per Tube BID  . furosemide  40 mg Intravenous Q12H  . ipratropium  0.5 mg Nebulization TID  . levalbuterol  0.63 mg Nebulization TID  . levothyroxine  150 mcg Per Tube QAC breakfast  . pantoprazole sodium  40 mg Per Tube BID  . pravastatin  20 mg Per Tube  q1800  . predniSONE  5 mg Oral Q breakfast  . QUEtiapine  12.5 mg Oral QHS  . sodium chloride  10-40 mL Intracatheter Q12H   Continuous Infusions: . feeding supplement (VITAL AF 1.2 CAL) 1,000 mL (02/27/14 1054)   PRN Meds:.bisacodyl, guaifenesin, hydrocortisone cream, levalbuterol, metoprolol, midazolam, polyethylene glycol, RESOURCE THICKENUP CLEAR, sodium chloride   ASSESMENT:   * Upper GI bleed. Rule out ulcer, rule out G-tube associated bleed. Rule out AVM.  Bleeding seems to have stopped. Hgb stable.  No transfusions to date.   * CVA. Status post 2 procedures for clot evacuation/revascularization.  Eliquis and all other thinners on hold. Last dose  02/26/13 at 2100  * Respiratory failure post CVA, status post trach.   *  Azotemia and AKI.  Suspect some of the azotemia is due to GI bleed.   PLAN   *  Have had to postpone upper endoscopy until tomorrow at 0800 due to tube feedings not having been discontinued.    Azucena Freed  02/28/2014, 9:42 AM Pager: [phone]  GI Attending Note  I have personally taken an interval history, reviewed the chart, and examined the patient.  I agree with the extender's note, impression and recommendations.  Hemoglobin continues to drift downward.  Plans for EGD tomorrow  Sandy Salaam. Deatra Ina, MD, [facility] [phone]

## 2014-03-01 NOTE — Anesthesia Postprocedure Evaluation (Signed)
  Anesthesia Post-op Note  Patient: Summer Bradley  Procedure(s) Performed: Procedure(s): ESOPHAGOGASTRODUODENOSCOPY (EGD) (N/A)  Patient Location: PACU  Anesthesia Type:MAC  Level of Consciousness: awake and alert   Airway and Oxygen Therapy: Patient Spontanous Breathing  Post-op Pain: none  Post-op Assessment: Post-op Vital signs reviewed, Patient's Cardiovascular Status Stable, Respiratory Function Stable, Patent Airway, No signs of Nausea or vomiting and Pain level controlled  Post-op Vital Signs: Reviewed and stable  Last Vitals:  Filed Vitals:   03/01/14 1232  BP: 84/72  Pulse: 78  Temp: 36.4 C  Resp: 18    Complications: No apparent anesthesia complications

## 2014-03-01 NOTE — Progress Notes (Signed)
Patient was found on the floor of her room and apparently slid out of bed at around 5:25 PM today. She was noted to have bloody stool with dark red blood. She was alert and had no complaints. Blood pressure was 77/53. A 500 mL bolus of  normal saline was started. Repeat blood pressure fluid bolus was 88/58. Eliquis was discontinued. Hemoglobin was 8.9 and hematocrit 30.2, down from 11.1 and 37.22 respectively 2 days ago.   She had an upper endoscopy study earlier today which showed no source for upper GI bleeding. Eliquis was restarted following this procedure.  For now she is receiving normal saline at 100. Hemoglobin and hematocrit will be rechecked at midnight. Blood sample was submitted for type and screen in case patient needs transfusion.  I will make the gastroenterologist service aware of this new development since sigmoidoscopy was recommended if patient had recurrent GI bleeding.  Rush Farmer M.D. [facility] [phone]

## 2014-03-01 NOTE — Progress Notes (Signed)
03/01/2014 when RN went back to room at [place] patient was complaining both knees hurt. Dr Wallie Char (neurology) was notified, orders were place to give tylenol for pain. [facility] RN.

## 2014-03-01 NOTE — Op Note (Signed)
[facility] [place], [address]   ENDOSCOPY PROCEDURE REPORT  PATIENT: Summer Bradley, Summer Bradley  MR#: [id number] BIRTHDATE: Jan 08, 1946 , 64  yrs. old GENDER: female ENDOSCOPIST: Inda Castle, MD REFERRED BY: PROCEDURE DATE:  03/01/2014 PROCEDURE:  EGD, diagnostic ASA CLASS:     Class III INDICATIONS:  melena. MEDICATIONS: Monitored anesthesia care TOPICAL ANESTHETIC:  DESCRIPTION OF PROCEDURE: After the risks benefits and alternatives of the procedure were thoroughly explained, informed consent was obtained.  The PENTAX GASTOROSCOPE [id number] endoscope was introduced through the mouth and advanced to the second portion of the duodenum , Without limitations.  The instrument was slowly withdrawn as the mucosa was fully examined.      EXAM: The esophagus and gastroesophageal junction were completely normal in appearance.  The stomach was entered and closely examined.The antrum, angularis, and lesser curvature were well visualized, including a retroflexed view of the cardia and fundus. The stomach wall was normally distensable. a gastrostomy tube was in place in the gastric body. The scope passed easily through the pylorus into the duodenum.         The scope was then withdrawn from the patient and the procedure completed.  COMPLICATIONS: There were no immediate complications.  ENDOSCOPIC IMPRESSION: Normal appearing esophagus and GE junction, the stomach was well visualized and normal in appearance, normal appearing duodenum  RECOMMENDATIONS: 1.  Hemmoccult stools 2.   okay to resume anticoagulants  REPEAT EXAM:  eSigned:  Inda Castle, MD 03/01/2014 9:15 AM    CC:  PATIENT NAME:  Corynn, Solberg MR#: [id number]

## 2014-03-01 NOTE — Progress Notes (Signed)
STROKE TEAM PROGRESS NOTE   SUBJECTIVE (INTERVAL HISTORY):   patient seen in endoscopy. UGI endoscopy normal. She had colonoscopy 3-4 years ago which was normal. Dr Deatra Ina GI recommends ok to resume anticoagulation. No. More black stools today OBJECTIVE Temp:  [96.9 F (36.1 C)-97.9 F (36.6 C)] 97.4 F (36.3 C) (01/14 0922) Pulse Rate:  [81-134] 104 (01/14 0925) Cardiac Rhythm:  [-] Atrial flutter (01/13 2120) Resp:  [18-31] 18 (01/14 0925) BP: (89-125)/(35-85) 125/81 mmHg (01/14 0925) SpO2:  [10 %-100 %] 96 % (01/14 0925)   Recent Labs Lab 02/22/14 1224 02/22/14 1608  GLUCAP 155* 122*    Recent Labs Lab 02/23/14 0420 02/24/14 0530 02/26/14 0545  NA 141 143 145  K 4.3 4.6 3.8  CL 105 108 105  CO2 24 28 31   GLUCOSE 141* 165* 181*  BUN 53* 68* 77*  CREATININE 0.97 1.07 1.11*  CALCIUM 9.4 9.6 9.2   No results for input(s): AST, ALT, ALKPHOS, BILITOT, PROT, ALBUMIN in the last 168 hours.  Recent Labs Lab 02/23/14 0420 02/24/14 0530 02/25/14 1648 02/26/14 0545 02/27/14 0925  WBC 25.2* 27.0* 30.7* 30.9* 21.5*  NEUTROABS  --   --   --  27.5*  --   HGB 12.1 12.3 11.9* 12.3 11.1*  HCT 40.6 41.6 39.7 40.3 37.2  MCV 79.5 80.3 80.0 81.9 81.9  PLT 366 369 339 325 270   No results for input(s): CKTOTAL, CKMB, CKMBINDEX, TROPONINI in the last 168 hours. No results for input(s): LABPROT, INR in the last 72 hours. No results for input(s): COLORURINE, LABSPEC, [place], GLUCOSEU, HGBUR, BILIRUBINUR, KETONESUR, PROTEINUR, UROBILINOGEN, NITRITE, LEUKOCYTESUR in the last 72 hours.  Invalid input(s): APPERANCEUR     Component Value Date/Time   CHOL 114 01/30/2014 0337   TRIG 102 01/30/2014 0337   HDL 24* 01/30/2014 0337   CHOLHDL 4.8 01/30/2014 0337   VLDL 20 01/30/2014 0337   LDLCALC 70 01/30/2014 0337   Lab Results  Component Value Date   HGBA1C 6.4* 01/30/2014      Component Value Date/Time   LABOPIA NONE DETECTED 01/29/2014 0659   COCAINSCRNUR NONE DETECTED  01/29/2014 0659   LABBENZ NONE DETECTED 01/29/2014 0659   AMPHETMU NONE DETECTED 01/29/2014 0659   THCU NONE DETECTED 01/29/2014 0659   LABBARB NONE DETECTED 01/29/2014 0659    No results for input(s): ETH in the last 168 hours.  I have personally reviewed the radiological images below and agree with the radiology interpretations.  Ct Head Wo Contrast  02/01/2014  Stat CT scan shows no acute changes in the brain, but there appears to be a thrombus in the basilar artery that is new.  01/30/2014    1. Resolved right MCA territory hyperdensity present at 1056 hrs yesterday, most likely was post Neurointervention parenchymal contrast staining. 2. Mild cytotoxic edema suspected in that same right MCA territory. Underlying chronic ischemic changes. 3. Stable parafalcine subdural hematoma. No new intracranial hemorrhage or acute mass effect. 4. No new intracranial abnormality.    01/29/2014    Acute infarct right MCA territory as noted on CT perfusion study. There is interval development of high density in the right temporoparietal cortex which may be due to contrast material or hemorrhage. Continued follow-up recommended.  Small interhemispheric subdural hematoma is unchanged.    01/29/2014   1. 4 mm thick posterior falx subdural hemorrhage. 2. Prominent density at the right M1/M2 junction. CTA is already planned. 3. Established infarcts in the anterior and posterior right MCA territory. No definitive  acute infarct. 4. Left frontal scalp contusion.  No acute fracture.    Ct Cerebral Perfusion W/cm 01/29/2014    1. Occlusive thrombus at the right M1-M2 junction. Subjectively good pial/pial collateral circulation. 2. Perfusion findings suggest moderate acute infarct in the central right MCA territory with rim of penumbra, as above. 3. Remote infarcts at the anterior and posterior margins of the right MCA territory. 4. Thin posterior falx subdural hemorrhage.     Cerebral Angiogram  01/29/2014 S/P  Bilateral common carotid arteriograms, followed by complete revascularization of RT MCA M1 occlusion usingX2 passes with the Solitaire FR 36mm x 9mm retrieval device And 10 mg of  superselective intracranial IA Integrelin. TICI 3 flow reesablished.   MRI brain  02/02/14 1. New, acute infarcts in the posterior circulation related to treated basilar thrombosis. Infarcts present in the bilateral cerebellum, upper right brainstem, mesial right temporal lobe, and minimally along the right occipital cortex. Normal flow voids within the intracranial vessels. 2. Stable appearance of recent right MCA territory infarct. 3. Stable trace falcine subdural hemorrhage.  01/31/14 1. Acute nonhemorrhagic infarct involving the superior right temporal lobe, right insular ribbon, right frontal operculum, and posterior right frontal lobe. 2. More remote infarcts are again noted in the more anterior inferior right frontal lobe and the right parietal lobe. 3. Extensive white matter changes bilaterally likely reflect the sequela of chronic microvascular ischemia, advanced for age. 4. White matter changes extend into the brainstem. 5. Remote tender infarcts of the cerebellum bilaterally, worse on the right.  Dg Chest Port 1 View 02/08/2014    1. Right PICC line stable position. 2. Congestive heart failure with interstitial edema and small right pleural effusion. Mild improvement from prior exam. 02/10/2014 Improved bibasilar atelectasis versus airspace disease. 02/13/2014 - No pneumothorax status post bilateral chest tube placement. Stable support apparatus 02/19/2014- Resolved left basilar pneumothorax. Bilateral hazy airspace disease not significantly changed  2D Echocardiogram  EF 50-55% with no source of embolus.   A1C 6.4 and LDL 70   Physical Exam General: The patient is awake, alert and following simple commands. S/p  peg. Trach discontinued  Respiratory: decrease breath sounds at the bilateral  bases with rhonchi and wheezing Cardiovascular: irregular rate and rhythm, no murmurs noted Abdomen: Soft. Peg tube Skin: No significant peripheral edema is noted. Neurologic Exam Mental status: The patient is awake alert cooperative, follows simple commands.  Cranial nerves: Pupils are equal, reactive to light. Difficulty with upgaze. Blnk to threat in all quadrants, VFF. Mild left facial asymmetry. horase voice but can be understood. Motor: mild weakness left arm, left arm drift. Bilateral Hip flexion mild weakness. Otherwise intact.  Sensory examination: The patient has decreased sensation on the left arm Coordination: No obvious ataxia seen on the extremities. Difficult to test. Intact FTN, difficult to test HTS.  Gait and station: Gait was not tested. Reflexes: Deep tendon reflexes are brisk throughout.  ASSESSMENT/PLAN Ms. Eletha Culbertson is a 69 y.o. female with history of hypertension, stroke, afib and COPD presenting with Left-sided weakness. She did not receive IV t-PA due to delay in arrival and ? SDH. But underwent mechanical thrombectomy of right MCA with complete vascularization of right MCA M1 occlusion with IA Integrilin. 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 Dr. Estanislado Pandy was able to extract the clot, and the patient has sustained a right pontine and midbrain stroke, small bilateral cerebellar strokes, and some extension of the infarct in the mesial temporal area on the  right. S/p trach and peg   Stroke:  R MCA infarct secondary to R MCA occlusion s/p revascularization with mechanical thrombectomy and IA integrelin. 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 Infarcts embolic secondary to Afib with subtherapeutic INR on coumadin.  Resultant left arm weakness and left facial droop  2D Echo  EF 50-55% no source of  embolus  HgbA1c 6.4  eliquis po for VTE prophylaxis  aspirin 81 mg orally every day and warfarin prior to admission, changed to eliquis for stroke prevention. Resume eliquis.   Neurologically stable   PT/OT on board and recommend SNF  Deposition - SNF tomorrow after off sitter for 24 hours.    Acute Respiratory Failure / COPD exacerbation   Intubated for neuro intervention-extubated 02/06/14  Tolerated well 24 hours post extubation.  Once to the floor 12/23, overnight developed more respiratory distress and concerning for aspiration  12/24 Transferred to step down unit for further monitoring  Developed the respiratory distress again on 02/13/2014, intubated and transferred to neuro ICU  Trach done on 02/15/14. Off vent now.  unasyn discontinued  On Xopenex and mucomyst neb treatment  On steroids tapering  Suction PRN  Atrial Fibrillation / A flutter with AV block  Home meds:  Warfarin  INR subtherapeutic on admission, INR 1.18   started Cardizem and s/p d/c amiodarone.   Rate controlled.  On eliquis for stroke prevention  Dysphagia  Aspiration pneumonia  Developed a respiratory distress again on 02/13/2014, intubated and transferred to neuro ICU. Concerning for aspiration again.   PEG done 02/15/14. TF restarted  Speech following  Positive stool occult blood test   No black stool   No frank blood in stool  Hb improved than before  Positive test likely due to her recent PEG procedure  Will resume eliquis  Close monitoring  Sun-downing  d/c sitter  d/c restraint  tapered off clonazepam  low dose seroquel   Hypertension  Continue to monitor  BP stable 120-140  On bisoprolol  Hyperlipidemia  Home meds:  Mevacor  LDL 70, goal < 70  On pravastatin  Continue statin on d/c  Trush  Fluconazole for 14 days  Started on 02/10/2014  Other Stroke Risk Factors  Advanced age  Obesity, Body mass index is 27.41 kg/(m^2).    Hx stroke/TIA  Other Active Problems  Depression, on chronic SSRI  Anemia - improved  Leukocytosis - on solumedrol - afebrile 18.2->20.5->21.3->24.1->18.5 Plan    UGI endoscopy for source of bleed negative OK to resume eliquis as per Dr Deatra Ina gastroenterologist.May need sigmoidoscopy/colonoscopy later if LGI bleed continues. This is a  high risk situation and risk benefit of anticoagulation versus risk of recurrent stroke.DC to [facility] day # 31   Antony Contras, MD Stroke Neurology 03/01/2014 12:12 PM    To contact Stroke Continuity provider, please refer to [email or web address] After hours, contact General Neurology

## 2014-03-01 NOTE — Clinical Social Work Note (Signed)
Heron Nay has received insurance authorization and is able to admit patient tomorrow.  CSW will continue to follow.  [name], [place] Social Worker [phone]

## 2014-03-01 NOTE — Transfer of Care (Signed)
Immediate Anesthesia Transfer of Care Note  Patient: Summer Bradley  Procedure(s) Performed: Procedure(s): ESOPHAGOGASTRODUODENOSCOPY (EGD) (N/A)  Patient Location: PACU and Endoscopy Unit  Anesthesia Type:MAC  Level of Consciousness: sedated  Airway & Oxygen Therapy: Patient Spontanous Breathing and Patient connected to nasal cannula oxygen  Post-op Assessment: Report given to PACU RN and Post -op Vital signs reviewed and stable  Post vital signs: Reviewed and stable  Complications: No apparent anesthesia complications

## 2014-03-01 NOTE — Progress Notes (Signed)
03/01/2014 Patient was found on the floor next to her bed in 2C06, by RN and Nursing Director at 1725, also bed alarm was not on. We observe that patient continue to having dark blood in her stool. Patient was place in the bed and was assessed. On right knee she had a small area which was bleeding and foam dressing was placed. Patient back on the left side was pink. She did not complain of any pain at that time.  Patient blood pressure was taken it was in the 14'E systolic. Neurology was notified about the fall and blood in stool. Orders were given by Dr Wallie Char for Normal Saline 500 cc bolus, labs and continuous iv fluid. Patient family was notified about the fall. Continue to monitor. [facility] RN.

## 2014-03-01 NOTE — Anesthesia Preprocedure Evaluation (Signed)
Anesthesia Evaluation  Patient identified by MRN, date of birth, ID band Patient awake    Reviewed: Allergy & Precautions, NPO status , Patient's Chart, lab work & pertinent test results  History of Anesthesia Complications Negative for: history of anesthetic complications  Airway Mallampati: III  TM Distance: >3 FB Neck ROM: Full    Dental  (+) Edentulous Upper, Edentulous Lower   Pulmonary shortness of breath, COPD COPD inhaler, former smoker,  Recent trach after multiple intubations s/p CVA with trach removed Sunday (12 days after placement), occlusive dressing over trach site.  breath sounds clear to auscultation        Cardiovascular hypertension, Pt. on medications and Pt. on home beta blockers + Peripheral Vascular Disease and +CHF + dysrhythmias Atrial Fibrillation Rhythm:Irregular     Neuro/Psych CVA, Residual Symptoms    GI/Hepatic Neg liver ROS, GERD-  Medicated,Blood in stool   Endo/Other  Hypothyroidism Morbid obesity  Renal/GU      Musculoskeletal   Abdominal   Peds  Hematology   Anesthesia Other Findings   Reproductive/Obstetrics                             Anesthesia Physical Anesthesia Plan  ASA: III  Anesthesia Plan: MAC   Post-op Pain Management:    Induction: Intravenous  Airway Management Planned: Natural Airway and Simple Face Mask  Additional Equipment: None  Intra-op Plan:   Post-operative Plan:   Informed Consent: I have reviewed the patients History and Physical, chart, labs and discussed the procedure including the risks, benefits and alternatives for the proposed anesthesia with the patient or authorized representative who has indicated his/her understanding and acceptance.     Plan Discussed with:   Anesthesia Plan Comments:         Anesthesia Quick Evaluation

## 2014-03-02 ENCOUNTER — Encounter (HOSPITAL_COMMUNITY): Payer: Self-pay | Admitting: Gastroenterology

## 2014-03-02 DIAGNOSIS — D62 Acute posthemorrhagic anemia: Secondary | ICD-10-CM

## 2014-03-02 LAB — CBC
HCT: 28.7 % — ABNORMAL LOW (ref 36.0–46.0)
HCT: 27.8 % — ABNORMAL LOW (ref 36.0–46.0)
HCT: 27.4 % — ABNORMAL LOW (ref 36.0–46.0)
Hemoglobin: 8.5 g/dL — ABNORMAL LOW (ref 12.0–15.0)
Hemoglobin: 8.1 g/dL — ABNORMAL LOW (ref 12.0–15.0)
Hemoglobin: 8 g/dL — ABNORMAL LOW (ref 12.0–15.0)
MCH: 24.1 pg — ABNORMAL LOW (ref 26.0–34.0)
MCH: 23.5 pg — ABNORMAL LOW (ref 26.0–34.0)
MCH: 23.5 pg — ABNORMAL LOW (ref 26.0–34.0)
MCHC: 29.6 g/dL — ABNORMAL LOW (ref 30.0–36.0)
MCHC: 29.1 g/dL — ABNORMAL LOW (ref 30.0–36.0)
MCHC: 29.2 g/dL — ABNORMAL LOW (ref 30.0–36.0)
MCV: 81.5 fL (ref 78.0–100.0)
MCV: 80.8 fL (ref 78.0–100.0)
MCV: 80.6 fL (ref 78.0–100.0)
Platelets: 220 10*3/uL (ref 150–400)
Platelets: 222 10*3/uL (ref 150–400)
Platelets: 222 10*3/uL (ref 150–400)
RBC: 3.52 MIL/uL — ABNORMAL LOW (ref 3.87–5.11)
RBC: 3.44 MIL/uL — ABNORMAL LOW (ref 3.87–5.11)
RBC: 3.4 MIL/uL — ABNORMAL LOW (ref 3.87–5.11)
RDW: 20.2 % — ABNORMAL HIGH (ref 11.5–15.5)
RDW: 20.3 % — ABNORMAL HIGH (ref 11.5–15.5)
RDW: 20 % — ABNORMAL HIGH (ref 11.5–15.5)
WBC: 12.9 10*3/uL — ABNORMAL HIGH (ref 4.0–10.5)
WBC: 12.6 10*3/uL — ABNORMAL HIGH (ref 4.0–10.5)
WBC: 13 10*3/uL — ABNORMAL HIGH (ref 4.0–10.5)

## 2014-03-02 LAB — PREPARE RBC (CROSSMATCH)

## 2014-03-02 MED ORDER — SODIUM CHLORIDE 0.9 % IV BOLUS (SEPSIS)
500.0000 mL | Freq: Once | INTRAVENOUS | Status: AC
  Administered 2014-03-02: 500 mL via INTRAVENOUS

## 2014-03-02 MED ORDER — HALOPERIDOL LACTATE 5 MG/ML IJ SOLN
1.0000 mg | Freq: Once | INTRAMUSCULAR | Status: AC
  Administered 2014-03-02: 1 mg via INTRAVENOUS

## 2014-03-02 MED ORDER — HALOPERIDOL LACTATE 5 MG/ML IJ SOLN
INTRAMUSCULAR | Status: AC
  Filled 2014-03-02: qty 1

## 2014-03-02 MED ORDER — SODIUM CHLORIDE 0.9 % IV SOLN: Freq: Once | INTRAVENOUS | Status: AC

## 2014-03-02 MED ORDER — PEG-KCL-NACL-NASULF-NA ASC-C 100 G PO SOLR
1.0000 | Freq: Once | ORAL | Status: AC
  Administered 2014-03-02 – 2014-03-03 (×2): 200 g via ORAL
  Filled 2014-03-02: qty 1

## 2014-03-02 NOTE — Progress Notes (Addendum)
Daily Rounding Note  03/02/2014, 9:34 AM  LOS: 32 days   SUBJECTIVE:       More dark stool in blood last night.  SBPs in 80s. Transferred to [place].   OBJECTIVE:         Vital signs in last 24 hours:    Temp:  [96.9 F (36.1 C)-97.7 F (36.5 C)] 97.7 F (36.5 C) (01/15 0430) Pulse Rate:  [76-109] 76 (01/15 0630) Resp:  [18-29] 22 (01/15 0630) BP: (77-104)/(40-72) 104/61 mmHg (01/15 0630) SpO2:  [93 %-99 %] 98 % (01/15 0909) Weight:  [178 lb 12.7 oz (81.1 kg)] 178 lb 12.7 oz (81.1 kg) (01/14 2308) Last BM Date: 03/01/14 Filed Weights   02/25/14 0339 02/26/14 0445 03/01/14 2308  Weight: 170 lb 6.7 oz (77.3 kg) 149 lb 14.6 oz (68 kg) 178 lb 12.7 oz (81.1 kg)   General: looks ill,    Heart: Afib , rate in 70s to 80s Chest: reduced BS, clear.  No dyspnea Abdomen: soft, NT, ND.  BS present .  Rectum with large external hemorrhoid, no masses internally.  Stool is mahogonny and smells melenic.  Extremities: no CCE Neuro/Psych:  oritned to place and time, self but forgetful of events that just took place.  Some yelling.   Intake/Output from previous day: 01/14 0701 - 01/15 0700 In: 494.6 [I.V.:494.6] Out: 1450 [Urine:1450]  Intake/Output this shift:    Lab Results:  Recent Labs  03/01/14 1805 03/01/14 2300 03/02/14 0650  WBC 14.3* 12.9* 12.6*  HGB 8.9* 8.5* 8.1*  HCT 30.2* 28.7* 27.8*  PLT 233 220 222   BMET  Recent Labs  03/01/14 1805  NA 141  K 3.1*  CL 96  CO2 35*  GLUCOSE 139*  BUN 34*  CREATININE 0.79  CALCIUM 8.1*   LFT No results for input(s): PROT, ALBUMIN, AST, ALT, ALKPHOS, BILITOT, BILIDIR, IBILI in the last 72 hours. PT/INR  Recent Labs  03/01/14 1805  LABPROT 16.2*  INR 1.29   Scheduled Meds: . antiseptic oral rinse  7 mL Mouth Rinse QID  . bisoprolol  2.5 mg Oral BID  . chlorhexidine  15 mL Mouth Rinse BID  . citalopram  20 mg Per Tube QHS  . diltiazem  60 mg Oral 4  times per day  . feeding supplement (PRO-STAT SUGAR FREE 64)  30 mL Per Tube BID  . furosemide  40 mg Intravenous Q12H  . levothyroxine  150 mcg Per Tube QAC breakfast  . pantoprazole sodium  40 mg Per Tube BID  . pravastatin  20 mg Per Tube q1800  . predniSONE  5 mg Oral Q breakfast  . QUEtiapine  12.5 mg Oral QHS  . sodium chloride  10-40 mL Intracatheter Q12H   Continuous Infusions: . sodium chloride 100 mL/hr at 03/01/14 1947  . feeding supplement (VITAL AF 1.2 CAL) 1,000 mL (03/01/14 2120)   PRN Meds:.acetaminophen, bisacodyl, guaifenesin, hydrocortisone cream, levalbuterol, metoprolol, midazolam, polyethylene glycol, RESOURCE THICKENUP CLEAR, RESOURCE THICKENUP CLEAR, sodium chloride   ASSESMENT:   * Upper GI bleed. Rule out ulcer, rule out G-tube associated bleed. Rule out AVM. Bleeding seems to have stopped. Hgb stable. No transfusions to date.  1/14EGD:  Normal.    *  ABL anemia.   * CVA. Status post 2 procedures for clot evacuation/revascularization.blood thinners remain on hold.  * Respiratory failure post CVA, status post trach.   * Azotemia and AKI. Suspect some of the azotemia is  due to GI bleed.   *  AKIU/ARF, improved.    PLAN   *  Consider flex sig or colonoscopy vs enteroscopy. Will confer with Dr Deatra Ina.     Summer Bradley  03/02/2014, 9:34 AM Pager: [phone]  GI Attending Note  I have personally taken an interval history, reviewed the chart, and examined the patient.  Events noted.  Patient has eaten today.  Plan colonoscopy in a.m.  If negative I will proceed with upper endoscopy and possible enteroscopy at the same sitting.  Sandy Salaam. Deatra Ina, MD, [facility] [phone]

## 2014-03-02 NOTE — Clinical Social Work Note (Signed)
CSW reviewed chart. Once the pt is medically stable the pt can transition to [facility]. On Monday Kim from [employer] will start insurance authorization again. [employer] authorization takes up to 48hr.  CSW will continue to follow and assist.   Eoin Willden, MSW, [place]

## 2014-03-02 NOTE — Progress Notes (Signed)
Medicare Important Message given? YES  (If response is "NO", the following Medicare IM given date fields will be blank)  Date Medicare IM given: 03/02/14  Medicare IM given by:  Jevaughn Degollado  

## 2014-03-02 NOTE — Progress Notes (Signed)
Pt transferred from Unit 2C around 2300, admitted to Rm/3s07. She is easily arousable with mild confusion. Skin tears noted to Arm and knee, foam dressings in place. Placed on telemetry, currently Afib.Daughter at bedside, oriented to room. Instructed to call for assistance before getting out of bed, all 4 side rails up per daughter request. Dr Leonel Ramsay made aware of pt soft BP, orders received. Pt resting comfortably at this time, will continue to monitor.

## 2014-03-03 ENCOUNTER — Encounter (HOSPITAL_COMMUNITY): Payer: Self-pay

## 2014-03-03 ENCOUNTER — Inpatient Hospital Stay (HOSPITAL_COMMUNITY): Payer: Medicare HMO

## 2014-03-03 ENCOUNTER — Encounter (HOSPITAL_COMMUNITY)
Admission: EM | Disposition: A | Discharge status: Skilled Nursing Facility | Payer: Self-pay | Source: Home / Self Care | Attending: Neurology

## 2014-03-03 DIAGNOSIS — D123 Benign neoplasm of transverse colon: Secondary | ICD-10-CM

## 2014-03-03 DIAGNOSIS — D126 Benign neoplasm of colon, unspecified: Secondary | ICD-10-CM

## 2014-03-03 DIAGNOSIS — Q2733 Arteriovenous malformation of digestive system vessel: Secondary | ICD-10-CM

## 2014-03-03 DIAGNOSIS — K5730 Diverticulosis of large intestine without perforation or abscess without bleeding: Secondary | ICD-10-CM

## 2014-03-03 DIAGNOSIS — K289 Gastrojejunal ulcer, unspecified as acute or chronic, without hemorrhage or perforation: Secondary | ICD-10-CM

## 2014-03-03 DIAGNOSIS — K317 Polyp of stomach and duodenum: Secondary | ICD-10-CM

## 2014-03-03 DIAGNOSIS — K5520 Angiodysplasia of colon without hemorrhage: Secondary | ICD-10-CM

## 2014-03-03 HISTORY — PX: COLONOSCOPY: SHX5424

## 2014-03-03 HISTORY — PX: ENTEROSCOPY: SHX5533

## 2014-03-03 SURGERY — COLONOSCOPY
Anesthesia: Moderate Sedation

## 2014-03-03 MED ORDER — MIDAZOLAM HCL 5 MG/ML IJ SOLN
INTRAMUSCULAR | Status: AC
  Filled 2014-03-03: qty 2

## 2014-03-03 MED ORDER — FENTANYL CITRATE 0.05 MG/ML IJ SOLN
INTRAMUSCULAR | Status: DC | PRN
  Administered 2014-03-03 (×2): 25 ug via INTRAVENOUS

## 2014-03-03 MED ORDER — CETYLPYRIDINIUM CHLORIDE 0.05 % MT LIQD
7.0000 mL | Freq: Two times a day (BID) | OROMUCOSAL | Status: DC
  Administered 2014-03-04 – 2014-03-05 (×3): 7 mL via OROMUCOSAL

## 2014-03-03 MED ORDER — FENTANYL CITRATE 0.05 MG/ML IJ SOLN
INTRAMUSCULAR | Status: AC
  Filled 2014-03-03: qty 2

## 2014-03-03 MED ORDER — IOHEXOL 300 MG/ML  SOLN: 100.0000 mL | Freq: Once | INTRAMUSCULAR | Status: AC | PRN

## 2014-03-03 MED ORDER — MIDAZOLAM HCL 5 MG/5ML IJ SOLN
INTRAMUSCULAR | Status: DC | PRN
  Administered 2014-03-03: 1 mg via INTRAVENOUS
  Administered 2014-03-03: 2 mg via INTRAVENOUS
  Administered 2014-03-03 (×2): 1 mg via INTRAVENOUS

## 2014-03-03 MED ORDER — CHLORHEXIDINE GLUCONATE 0.12 % MT SOLN
15.0000 mL | Freq: Two times a day (BID) | OROMUCOSAL | Status: DC
  Administered 2014-03-03 – 2014-03-05 (×4): 15 mL via OROMUCOSAL
  Filled 2014-03-03 (×6): qty 15

## 2014-03-03 NOTE — Op Note (Signed)
[facility] [place], [id number]   ENTEROSCOPY PROCEDURE REPORT     EXAM DATE: 03/03/2014  PATIENT NAME:      Summer Bradley, Summer Bradley           MR #:      [id number] BIRTHDATE:       14-Apr-1945      VISIT #:     [phone]  ATTENDING:     Inda Castle, MD     STATUS:     inpatient ASSISTANT:      Hilma Favors and Lindaann Slough MD: ASA CLASS:        Class III  INDICATIONS:  The patient is a 69 yr old female here for an enteroscopy procedure due to melenic bleeding. PROCEDURE PERFORMED:     Small bowel enteroscopy with control of bleeding  MEDICATIONS:     Residual sedation present, Versed 2 mg IV, and Fentanyl 25 mcg IV  CONSENT: The patient understands the risks and benefits of the procedure and understands that these risks include, but are not limited to: sedation, allergic reaction, infection, perforation and/or bleeding. Alternative means of evaluation and treatment include, among others: physical exam, x-rays, and/or surgical intervention. The patient elects to proceed with this endoscopic procedure.  DESCRIPTION OF PROCEDURE: During intra-op preparation period all mechanical & medical equipment was checked for proper function. Hand hygiene and appropriate measures for infection prevention was taken. After the risks, benefits and alternatives of the procedure were thoroughly explained, Informed consent was verified, confirmed and timeout was successfully executed by the treatment team. The Pentax [id number] endoscope was introduced through the mouth and advanced to the proximal jejunum jejunum. The prep was   . The instrument was then slowly withdrawn while examining the mucosa circumferentially. The scope was then completely withdrawn from the patient and the procedure terminated. The pulse, BP, and O2 saturation were monitored and documented by the physician and the nursing staff throughout the entire procedure.  The  patient was cared for as planned according to standard protocol, then discharged to recovery in stable condition and with appropriate post procedure care.    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  Argon plasma coagulation was applied to the site(s).   A single non-bleeding ulcer measuring 1 x 83mm in size was found in the proximal jejunum.  ESOPHAGUS: The esophagus and gastroesophageal junction were completely normal in appearance.  STOMACH: The stomach was entered and closely examined.  the antrum, angularis, and lesser curvature were well visualized, including a retroflexed view of the cardia and fundus.  The stomach wall was normally distensable.  The scope passed easily through the pylorus into the duodenum.   There were multiple 1-2 mm polyps with smooth overlying mucosa in the gastric fundus  DUODENUM: The duodenal bulb was normal in appearance, as was the postbulbar duodenum.    ADVERSE EVENTS:      There were no immediate complications.  IMPRESSIONS:     1.  jejunal AVM 2.  Jejunal ulcer 3.  Nonbleeding gastric polyps  RECOMMENDATIONS:     iron supplementation CT scan to better delineate colonic anatomy  RECALL:  _____________________________ Inda Castle, MD eSigned:  Inda Castle, MD 03/03/2014 10:30 AM   cc:  Antony Contras, MD     PATIENT NAME:  Summer Bradley, Summer Bradley MR#: [id number]

## 2014-03-03 NOTE — Op Note (Addendum)
[facility] [place], [address]   COLONOSCOPY PROCEDURE REPORT  PATIENT: Summer, Bradley  MR#: [id number] BIRTHDATE: 06-Nov-1945 , 64  yrs. old GENDER: female ENDOSCOPIST: Inda Castle, MD REFERRED BY: PROCEDURE DATE:  03/03/2014 PROCEDURE:   Colonoscopy, diagnostic  ASA CLASS:   Class III INDICATIONS:melena. MEDICATIONS: Versed 3 mg IV and Fentanyl 25 mcg IV  DESCRIPTION OF PROCEDURE:   After the risks benefits and alternatives of the procedure were thoroughly explained, informed consent was obtained.  The digital rectal exam revealed no abnormalities of the rectum.   The Pentax Adult Colon [id number] endoscope was introduced through the anus and advanced to the cecum, which was identified by the appearance. Cecal landmarks including appendiceal orifice and ileocecal valve could not definitively be identified accordingly, I could not say with certainty whether the cecum was intubated  although the area appeared to be a blind end.  No adverse events experienced.   The quality of the prep was good, using MiraLax  The instrument was then slowly withdrawn as the colon was fully examined.      COLON FINDINGS: There was mild diverticulosis noted in the sigmoid colon.   Internal hemorrhoids were found.  In the mid transverse colon there is a single sessile nonbleeding 2 mm polyp. The examination was otherwise normal.  Retroflexed views revealed no abnormalities. The time to cecum=  .  Withdrawal time=18 minutes 0 seconds.  The scope was withdrawn and the procedure completed. COMPLICATIONS: There were no immediate complications.  ENDOSCOPIC IMPRESSION: 1.   Mild diverticulosis was noted in the sigmoid colon 2.   Internal hemorrhoids 3.  nonbleeding: Polyp 4.   The examination was otherwise normal  no obvious colonic bleeding source  RECOMMENDATIONS: enteroscopy  eSigned:  Inda Castle, MD 03/03/2014 10:32 AM Revised: 03/03/2014  10:32 AM  cc: Rolm Gala, MD   PATIENT NAME:  Summer, Bradley MR#: [id number]

## 2014-03-03 NOTE — Progress Notes (Signed)
Anoscopy did not identify a bleeding source.  Few diverticula and internal hemorrhoids.  The very proximal colon had somewhat altered anatomy.  I have ordered a CT scan to better delineate the anatomy and to ensure that there are no cecal lesions.  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  A nonbleeding jejunal ulcer was also identified   Either could be a source for GI bleeding.  Patient certainly may have AVMs elsewhere in the GI tract.  Cannot rule out a Dielofoy's ulcer.  Recommend iron supplementation.  CT scan has been ordered.  Would continue to observe off anticoagulants.  If bleeding recurs then I would consider balloon enteroscopy of the small bowel which would have to be done at a tertiary care center

## 2014-03-03 NOTE — Progress Notes (Signed)
STROKE TEAM PROGRESS NOTE   SUBJECTIVE (INTERVAL HISTORY):  Patient is alert and cooperative, daughters in the room. Plans for upper endoscopy and colonoscopy today.  OBJECTIVE Temp:  [97.6 F (36.4 C)-97.9 F (36.6 C)] 97.9 F (36.6 C) (01/16 0700) Pulse Rate:  [77-116] 78 (01/16 0831) Cardiac Rhythm:  [-] Atrial flutter (01/16 0318) Resp:  [15-36] 30 (01/16 0831) BP: (90-110)/(32-72) 109/72 mmHg (01/16 0831) SpO2:  [96 %-100 %] 99 % (01/16 0831)  No results for input(s): GLUCAP in the last 168 hours.  Recent Labs Lab 02/26/14 0545 03/01/14 1805  NA 145 141  K 3.8 3.1*  CL 105 96  CO2 31 35*  GLUCOSE 181* 139*  BUN 77* 34*  CREATININE 1.11* 0.79  CALCIUM 9.2 8.1*   No results for input(s): AST, ALT, ALKPHOS, BILITOT, PROT, ALBUMIN in the last 168 hours.  Recent Labs Lab 02/26/14 0545 02/27/14 0925 03/01/14 1805 03/01/14 2300 03/02/14 0650 03/02/14 1300  WBC 30.9* 21.5* 14.3* 12.9* 12.6* 13.0*  NEUTROABS 27.5*  --   --   --   --   --   HGB 12.3 11.1* 8.9* 8.5* 8.1* 8.0*  HCT 40.3 37.2 30.2* 28.7* 27.8* 27.4*  MCV 81.9 81.9 79.3 81.5 80.8 80.6  PLT 325 270 233 220 222 222   No results for input(s): CKTOTAL, CKMB, CKMBINDEX, TROPONINI in the last 168 hours.  Recent Labs  03/01/14 1805  LABPROT 16.2*  INR 1.29   No results for input(s): COLORURINE, LABSPEC, PHURINE, GLUCOSEU, HGBUR, BILIRUBINUR, KETONESUR, PROTEINUR, UROBILINOGEN, NITRITE, LEUKOCYTESUR in the last 72 hours.  Invalid input(s): APPERANCEUR     Component Value Date/Time   CHOL 114 01/30/2014 0337   TRIG 102 01/30/2014 0337   HDL 24* 01/30/2014 0337   CHOLHDL 4.8 01/30/2014 0337   VLDL 20 01/30/2014 0337   LDLCALC 70 01/30/2014 0337   Lab Results  Component Value Date   HGBA1C 6.4* 01/30/2014      Component Value Date/Time   LABOPIA NONE DETECTED 01/29/2014 0659   COCAINSCRNUR NONE DETECTED 01/29/2014 0659   LABBENZ NONE DETECTED 01/29/2014 0659   AMPHETMU NONE DETECTED 01/29/2014  0659   THCU NONE DETECTED 01/29/2014 0659   LABBARB NONE DETECTED 01/29/2014 0659    No results for input(s): ETH in the last 168 hours.  I have personally reviewed the radiological images below and agree with the radiology interpretations.  Ct Head Wo Contrast  02/01/2014  Stat CT scan shows no acute changes in the brain, but there appears to be a thrombus in the basilar artery that is new.  01/30/2014    1. Resolved right MCA territory hyperdensity present at 1056 hrs yesterday, most likely was post Neurointervention parenchymal contrast staining. 2. Mild cytotoxic edema suspected in that same right MCA territory. Underlying chronic ischemic changes. 3. Stable parafalcine subdural hematoma. No new intracranial hemorrhage or acute mass effect. 4. No new intracranial abnormality.    01/29/2014    Acute infarct right MCA territory as noted on CT perfusion study. There is interval development of high density in the right temporoparietal cortex which may be due to contrast material or hemorrhage. Continued follow-up recommended.  Small interhemispheric subdural hematoma is unchanged.    01/29/2014   1. 4 mm thick posterior falx subdural hemorrhage. 2. Prominent density at the right M1/M2 junction. CTA is already planned. 3. Established infarcts in the anterior and posterior right MCA territory. No definitive acute infarct. 4. Left frontal scalp contusion.  No acute fracture.  Ct Cerebral Perfusion W/cm 01/29/2014    1. Occlusive thrombus at the right M1-M2 junction. Subjectively good pial/pial collateral circulation. 2. Perfusion findings suggest moderate acute infarct in the central right MCA territory with rim of penumbra, as above. 3. Remote infarcts at the anterior and posterior margins of the right MCA territory. 4. Thin posterior falx subdural hemorrhage.     Cerebral Angiogram  01/29/2014 S/P Bilateral common carotid arteriograms, followed by complete revascularization of RT MCA M1 occlusion  usingX2 passes with the Solitaire FR 85mm x 63mm retrieval device And 10 mg of  superselective intracranial IA Integrelin. TICI 3 flow reesablished.   MRI brain  02/02/14 1. New, acute infarcts in the posterior circulation related to treated basilar thrombosis. Infarcts present in the bilateral cerebellum, upper right brainstem, mesial right temporal lobe, and minimally along the right occipital cortex. Normal flow voids within the intracranial vessels. 2. Stable appearance of recent right MCA territory infarct. 3. Stable trace falcine subdural hemorrhage.  01/31/14 1. Acute nonhemorrhagic infarct involving the superior right temporal lobe, right insular ribbon, right frontal operculum, and posterior right frontal lobe. 2. More remote infarcts are again noted in the more anterior inferior right frontal lobe and the right parietal lobe. 3. Extensive white matter changes bilaterally likely reflect the sequela of chronic microvascular ischemia, advanced for age. 4. White matter changes extend into the brainstem. 5. Remote tender infarcts of the cerebellum bilaterally, worse on the right.  Dg Chest Port 1 View 02/08/2014    1. Right PICC line stable position. 2. Congestive heart failure with interstitial edema and small right pleural effusion. Mild improvement from prior exam. 02/10/2014 Improved bibasilar atelectasis versus airspace disease. 02/13/2014 - No pneumothorax status post bilateral chest tube placement. Stable support apparatus 02/19/2014- Resolved left basilar pneumothorax. Bilateral hazy airspace disease not significantly changed  2D Echocardiogram  EF 50-55% with no source of embolus.   A1C 6.4 and LDL 70   Physical Exam  General: The patient is alert and cooperative at the time of the examination.  Respiratory: Lung fields are clear  Cardiovascular: Occasional irregular heart rhythm  Abdomen: Positive bowel sounds, nontender  Skin: No significant peripheral  edema is noted.   Neurologic Exam  Mental status: The patient is alert, cooperative at the time of examination.  Cranial nerves: Facial symmetry is present. Speech is normal, no aphasia or dysarthria is noted. Extraocular movements are full, with exception there may be some restriction of superior gaze. Visual fields are full.  Motor: The patient has good strength in all 4 extremities.  Sensory examination: Soft touch sensation is symmetric on the face, arms, and legs.  Coordination: The patient has good finger-nose-finger and heel-to-shin bilaterally, with exception of slight dysmetria with finger-nose-finger on the left.  Gait and station: The gait was not tested.  Reflexes: Deep tendon reflexes are symmetric.   ASSESSMENT/PLAN Ms. Summer Bradley is a 69 y.o. female with history of hypertension, stroke, afib and COPD presenting with Left-sided weakness. She did not receive IV t-PA due to delay in arrival and ? SDH. But underwent mechanical thrombectomy of right MCA with complete vascularization of right MCA M1 occlusion with IA Integrilin. 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 Dr. Estanislado Pandy was able to extract the clot, and the patient has sustained a right pontine and midbrain stroke, small bilateral cerebellar strokes, and some extension of the infarct in the mesial temporal area on the right. S/p trach and peg   Stroke:  R MCA infarct secondary to R MCA occlusion s/p revascularization with mechanical thrombectomy and IA integrelin. 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 Infarcts embolic secondary to Afib with subtherapeutic INR on coumadin.  Resultant left arm weakness and left facial droop  2D Echo  EF 50-55% no source of embolus  HgbA1c 6.4  eliquis po for VTE prophylaxis  aspirin 81 mg orally every day and warfarin  prior to admission, changed to eliquis for stroke prevention. Eliquis is on hold currently.  Neurologically stable   PT/OT on board and recommend SNF  Deposition - SNF following admission    Acute Respiratory Failure / COPD exacerbation   Intubated for neuro intervention-extubated 02/06/14  Once to the floor 12/23, overnight developed more respiratory distress and concerning for aspiration  12/24 Transferred to step down unit for further monitoring  Developed the respiratory distress again on 02/13/2014, intubated and transferred to neuro ICU  Trach done on 02/15/14. Off vent now.  unasyn discontinued  On Xopenex and mucomyst neb treatment  Atrial Fibrillation / A flutter with AV block  Home meds:  Warfarin  INR subtherapeutic on admission, INR 1.18   started Cardizem and s/p d/c amiodarone.   Rate controlled.  On eliquis for stroke prevention  Dysphagia  Aspiration pneumonia  Developed a respiratory distress again on 02/13/2014, intubated and transferred to neuro ICU. Concerning for aspiration again.   PEG done 02/15/14. TF restarted  Speech following  Positive stool occult blood test   Hb 8.0  Close monitoring  Sun-downing  d/c sitter  d/c restraint  tapered off clonazepam  low dose seroquel   Hypertension  Continue to monitor  BP stable 120-140  On bisoprolol  Hyperlipidemia  Home meds:  Mevacor  LDL 70, goal < 70  On pravastatin  Continue statin on d/c  Trush  Fluconazole for 14 days  Started on 02/10/2014  Other Stroke Risk Factors  Advanced age  Obesity, Body mass index is 32.69 kg/(m^2).   Hx stroke/TIA  Other Active Problems  Depression, on chronic SSRI  Anemia     Leukocytosis - on solumedrol - afebrile 18.2->20.5->21.3->24.1->18.5 Plan    UGI endoscopy for source of bleed negative OK to resume eliquis as per Dr Deatra Ina gastroenterologist.May need sigmoidoscopy/colonoscopy later if LGI bleed continues. This  is a  high risk situation and risk benefit of anticoagulation versus risk of recurrent stroke. Hospital day # 16   Plan for upper endoscopy and colonoscopy today to evaluate the patient for source of bleed. The patient is off Eliquis currently. We will need to follow hemoglobin, white count over time. Check chemistries, hypokalemia noted previously.  Lenor Coffin [phone] 03/03/2014 9:12 AM    To contact Stroke Continuity provider, please refer to [email or web address] After hours, contact General Neurology

## 2014-03-03 NOTE — Progress Notes (Signed)
SLP Cancellation Note  Patient Details Name: Marshall Roehrich MRN: [id number] DOB: 04/03/1945   Cancelled treatment:       Reason Eval/Treat Not Completed: Patient at procedure or test/unavailable. Upper endoscopy today. Will f/u Monday   Bellina Tokarczyk, Katherene Ponto 03/03/2014, 9:37 AM

## 2014-03-03 NOTE — Interval H&P Note (Signed)
History and Physical Interval Note:  03/03/2014 9:09 AM  Summer Bradley  has presented today for surgery, with the diagnosis of GI bleed  The various methods of treatment have been discussed with the patient and family. After consideration of risks, benefits and other options for treatment, the patient has consented to  Procedure(s): COLONOSCOPY (N/A) ENTEROSCOPY (N/A) as a surgical intervention .  The patient's history has been reviewed, patient examined, no change in status, stable for surgery.  I have reviewed the patient's chart and labs.  Questions were answered to the patient's satisfaction.     The recent H&P (dated *03/02/14**) was reviewed, the patient was examined and there is no change in the patients condition since that H&P was completed.   Erskine Emery  03/03/2014, 9:09 AM   Erskine Emery

## 2014-03-03 NOTE — H&P (View-Only) (Signed)
Daily Rounding Note  03/02/2014, 9:34 AM  LOS: 32 days   SUBJECTIVE:       More dark stool in blood last night.  SBPs in 80s. Transferred to [place].   OBJECTIVE:         Vital signs in last 24 hours:    Temp:  [96.9 F (36.1 C)-97.7 F (36.5 C)] 97.7 F (36.5 C) (01/15 0430) Pulse Rate:  [76-109] 76 (01/15 0630) Resp:  [18-29] 22 (01/15 0630) BP: (77-104)/(40-72) 104/61 mmHg (01/15 0630) SpO2:  [93 %-99 %] 98 % (01/15 0909) Weight:  [178 lb 12.7 oz (81.1 kg)] 178 lb 12.7 oz (81.1 kg) (01/14 2308) Last BM Date: 03/01/14 Filed Weights   02/25/14 0339 02/26/14 0445 03/01/14 2308  Weight: 170 lb 6.7 oz (77.3 kg) 149 lb 14.6 oz (68 kg) 178 lb 12.7 oz (81.1 kg)   General: looks ill,    Heart: Afib , rate in 70s to 80s Chest: reduced BS, clear.  No dyspnea Abdomen: soft, NT, ND.  BS present .  Rectum with large external hemorrhoid, no masses internally.  Stool is mahogonny and smells melenic.  Extremities: no CCE Neuro/Psych:  oritned to place and time, self but forgetful of events that just took place.  Some yelling.   Intake/Output from previous day: 01/14 0701 - 01/15 0700 In: 494.6 [I.V.:494.6] Out: 1450 [Urine:1450]  Intake/Output this shift:    Lab Results:  Recent Labs  03/01/14 1805 03/01/14 2300 03/02/14 0650  WBC 14.3* 12.9* 12.6*  HGB 8.9* 8.5* 8.1*  HCT 30.2* 28.7* 27.8*  PLT 233 220 222   BMET  Recent Labs  03/01/14 1805  NA 141  K 3.1*  CL 96  CO2 35*  GLUCOSE 139*  BUN 34*  CREATININE 0.79  CALCIUM 8.1*   LFT No results for input(s): PROT, ALBUMIN, AST, ALT, ALKPHOS, BILITOT, BILIDIR, IBILI in the last 72 hours. PT/INR  Recent Labs  03/01/14 1805  LABPROT 16.2*  INR 1.29   Scheduled Meds: . antiseptic oral rinse  7 mL Mouth Rinse QID  . bisoprolol  2.5 mg Oral BID  . chlorhexidine  15 mL Mouth Rinse BID  . citalopram  20 mg Per Tube QHS  . diltiazem  60 mg Oral 4  times per day  . feeding supplement (PRO-STAT SUGAR FREE 64)  30 mL Per Tube BID  . furosemide  40 mg Intravenous Q12H  . levothyroxine  150 mcg Per Tube QAC breakfast  . pantoprazole sodium  40 mg Per Tube BID  . pravastatin  20 mg Per Tube q1800  . predniSONE  5 mg Oral Q breakfast  . QUEtiapine  12.5 mg Oral QHS  . sodium chloride  10-40 mL Intracatheter Q12H   Continuous Infusions: . sodium chloride 100 mL/hr at 03/01/14 1947  . feeding supplement (VITAL AF 1.2 CAL) 1,000 mL (03/01/14 2120)   PRN Meds:.acetaminophen, bisacodyl, guaifenesin, hydrocortisone cream, levalbuterol, metoprolol, midazolam, polyethylene glycol, RESOURCE THICKENUP CLEAR, RESOURCE THICKENUP CLEAR, sodium chloride   ASSESMENT:   * Upper GI bleed. Rule out ulcer, rule out G-tube associated bleed. Rule out AVM. Bleeding seems to have stopped. Hgb stable. No transfusions to date.  1/14EGD:  Normal.    *  ABL anemia.   * CVA. Status post 2 procedures for clot evacuation/revascularization.blood thinners remain on hold.  * Respiratory failure post CVA, status post trach.   * Azotemia and AKI. Suspect some of the azotemia is  due to GI bleed.   *  AKIU/ARF, improved.    PLAN   *  Consider flex sig or colonoscopy vs enteroscopy. Will confer with Dr Deatra Ina.     Summer Bradley  03/02/2014, 9:34 AM Pager: [phone]  GI Attending Note  I have personally taken an interval history, reviewed the chart, and examined the patient.  Events noted.  Patient has eaten today.  Plan colonoscopy in a.m.  If negative I will proceed with upper endoscopy and possible enteroscopy at the same sitting.  Sandy Salaam. Deatra Ina, MD, [facility] [phone]

## 2014-03-04 LAB — CBC WITH DIFFERENTIAL/PLATELET
Basophils Absolute: 0 10*3/uL (ref 0.0–0.1)
Basophils Relative: 0 % (ref 0–1)
Eosinophils Absolute: 0.4 10*3/uL (ref 0.0–0.7)
Eosinophils Relative: 4 % (ref 0–5)
HCT: 26.4 % — ABNORMAL LOW (ref 36.0–46.0)
Hemoglobin: 7.8 g/dL — ABNORMAL LOW (ref 12.0–15.0)
Lymphocytes Relative: 15 % (ref 12–46)
Lymphs Abs: 1.4 10*3/uL (ref 0.7–4.0)
MCH: 23.6 pg — ABNORMAL LOW (ref 26.0–34.0)
MCHC: 29.5 g/dL — ABNORMAL LOW (ref 30.0–36.0)
MCV: 79.8 fL (ref 78.0–100.0)
Monocytes Absolute: 0.5 10*3/uL (ref 0.1–1.0)
Monocytes Relative: 5 % (ref 3–12)
Neutro Abs: 7.3 10*3/uL (ref 1.7–7.7)
Neutrophils Relative %: 76 % (ref 43–77)
Platelets: 248 10*3/uL (ref 150–400)
RBC: 3.31 MIL/uL — ABNORMAL LOW (ref 3.87–5.11)
RDW: 19.9 % — ABNORMAL HIGH (ref 11.5–15.5)
WBC: 9.7 10*3/uL (ref 4.0–10.5)

## 2014-03-04 LAB — BASIC METABOLIC PANEL
Anion gap: 7 (ref 5–15)
BUN: 12 mg/dL (ref 6–23)
CO2: 32 mmol/L (ref 19–32)
Calcium: 7.7 mg/dL — ABNORMAL LOW (ref 8.4–10.5)
Chloride: 103 mEq/L (ref 96–112)
Creatinine, Ser: 0.56 mg/dL (ref 0.50–1.10)
GFR calc Af Amer: >90 mL/min (ref >90)
GFR calc non Af Amer: >90 mL/min (ref >90)
Glucose, Bld: 121 mg/dL — ABNORMAL HIGH (ref 70–99)
Potassium: 2.5 mmol/L — CL (ref 3.5–5.1)
Sodium: 142 mmol/L (ref 135–145)

## 2014-03-04 MED ORDER — POTASSIUM CHLORIDE 20 MEQ PO PACK: 40.0000 meq | PACK | Freq: Two times a day (BID) | ORAL | Status: DC

## 2014-03-04 MED ORDER — ALPRAZOLAM 0.25 MG PO TABS
0.2500 mg | ORAL_TABLET | Freq: Three times a day (TID) | ORAL | Status: DC
  Administered 2014-03-04 (×3): 0.25 mg via ORAL
  Filled 2014-03-04 (×3): qty 1

## 2014-03-04 MED ORDER — POTASSIUM CHLORIDE 10 MEQ/50ML IV SOLN
INTRAVENOUS | Status: AC
  Filled 2014-03-04: qty 50

## 2014-03-04 MED ORDER — POTASSIUM CHLORIDE CRYS ER 20 MEQ PO TBCR
40.0000 meq | EXTENDED_RELEASE_TABLET | Freq: Two times a day (BID) | ORAL | Status: DC
  Administered 2014-03-04 – 2014-03-05 (×3): 40 meq via ORAL
  Filled 2014-03-04 (×5): qty 2

## 2014-03-04 MED ORDER — POTASSIUM CHLORIDE 10 MEQ/50ML IV SOLN
10.0000 meq | INTRAVENOUS | Status: AC | PRN
  Administered 2014-03-04 (×3): 10 meq via INTRAVENOUS
  Filled 2014-03-04 (×2): qty 50

## 2014-03-04 NOTE — Progress Notes (Signed)
Speech Language Pathology Treatment: Dysphagia  Patient Details Name: Summer Bradley MRN: [id number] DOB: Dec 12, 1945 Today's Date: 03/04/2014 Time: 2229-7989 SLP Time Calculation (min) (ACUTE ONLY): 15 min  Assessment / Plan / Recommendation Clinical Impression  Orders accidentally cancelled by RN, she re-wrote SLP order. Pt alert, de cannulated, tolerating honey thick liquids per RN as long as sips size regulated. SLP provided trials of thin liquids with 2 verbal cues given in advance to remind pt of need for small sips, with 100% compliance. Pt did have delayed cough following thin liquids trials. Nectar thick trials more successful indicating readiness for repeat MBS for diet upgrade. Will plan for this tomorrow.    HPI HPI: 69 yo developed neuro changes 12/14/15with left side weakness that resulted in a fall. Found to have right MCA clot, taken to IR for removal and revascularization procedure. 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 Functionally, these deficits manifested as left buccal residue, reduced oral manipulation and control, anterior spillage, and wet phonation and cough associated with ice chips/thin liquids. Pt advanced to dysphagia 2, thin liquids on 12/16. 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 Reintubated 12/17-12/22. Revascularization 12/17. 12/18 MRI reveals new multi focal infarcts in the posterior circulation, affecting the mesial right temporal lobe, right occipital cortex, bilateral mid and upper cerebellum and right upper pons and midbrain. FEES 12/23 with recs to begin dysphagia 1, honey-thick liquids. Pt subsequently with respiratory decline, made NPO. Repeat BSE 12/25 recommended resume Dys 1, honey liquids. SLP treatment session with breakfast 12/29 with max cues with feeding due to impulsivity, however no evidence of  aspiraiton.  with reintubation 12/23 pm.One hour following session pt developed airway plugging, decreased responsiveness was reintubated 12/29 and received trach 12/31, tolerating trach collar and PMSV ordered.   Pertinent Vitals    SLP Plan  MBS    Recommendations Diet recommendations: Dysphagia 1 (puree);Honey-thick liquid Liquids provided via: Cup;No straw Medication Administration: Crushed with puree Supervision: Patient able to self feed;Full supervision/cueing for compensatory strategies;Staff to assist with self feeding Compensations: Slow rate;Small sips/bites;Check for pocketing;Check for anterior loss Postural Changes and/or Swallow Maneuvers: Seated upright 90 degrees              Oral Care Recommendations: Oral care BID Follow up Recommendations: Skilled Nursing facility Plan: [place] Summer Bradley, [place] CCC-SLP [phone]  Summer Bradley 03/04/2014, 2:56 PM

## 2014-03-04 NOTE — Progress Notes (Signed)
STROKE TEAM PROGRESS NOTE   SUBJECTIVE (INTERVAL HISTORY):  Patient is alert and cooperative, daughter is in the room. The patient has had some issues with intermittent agitation, requiring Versed.  OBJECTIVE Temp:  [97.3 F (36.3 C)-98.9 F (37.2 C)] 98.9 F (37.2 C) (01/17 0700) Pulse Rate:  [65-137] 72 (01/17 0359) Cardiac Rhythm:  [-] Atrial fibrillation (01/17 0030) Resp:  [21-32] 31 (01/17 0359) BP: (91-111)/(50-66) 105/57 mmHg (01/17 0359) SpO2:  [85 %-100 %] 100 % (01/17 0359)  No results for input(s): GLUCAP in the last 168 hours.  Recent Labs Lab 02/26/14 0545 03/01/14 1805 03/04/14 0550  NA 145 141 142  K 3.8 3.1* 2.5*  CL 105 96 103  CO2 31 35* 32  GLUCOSE 181* 139* 121*  BUN 77* 34* 12  CREATININE 1.11* 0.79 0.56  CALCIUM 9.2 8.1* 7.7*   No results for input(s): AST, ALT, ALKPHOS, BILITOT, PROT, ALBUMIN in the last 168 hours.  Recent Labs Lab 02/26/14 0545  03/01/14 1805 03/01/14 2300 03/02/14 0650 03/02/14 1300 03/04/14 0550  WBC 30.9*  < > 14.3* 12.9* 12.6* 13.0* 9.7  NEUTROABS 27.5*  --   --   --   --   --  7.3  HGB 12.3  < > 8.9* 8.5* 8.1* 8.0* 7.8*  HCT 40.3  < > 30.2* 28.7* 27.8* 27.4* 26.4*  MCV 81.9  < > 79.3 81.5 80.8 80.6 79.8  PLT 325  < > 233 220 222 222 248  < > = values in this interval not displayed. No results for input(s): CKTOTAL, CKMB, CKMBINDEX, TROPONINI in the last 168 hours.  Recent Labs  03/01/14 1805  LABPROT 16.2*  INR 1.29   No results for input(s): COLORURINE, LABSPEC, PHURINE, GLUCOSEU, HGBUR, BILIRUBINUR, KETONESUR, PROTEINUR, UROBILINOGEN, NITRITE, LEUKOCYTESUR in the last 72 hours.  Invalid input(s): APPERANCEUR     Component Value Date/Time   CHOL 114 01/30/2014 0337   TRIG 102 01/30/2014 0337   HDL 24* 01/30/2014 0337   CHOLHDL 4.8 01/30/2014 0337   VLDL 20 01/30/2014 0337   LDLCALC 70 01/30/2014 0337   Lab Results  Component Value Date   HGBA1C 6.4* 01/30/2014      Component Value Date/Time    LABOPIA NONE DETECTED 01/29/2014 0659   COCAINSCRNUR NONE DETECTED 01/29/2014 0659   LABBENZ NONE DETECTED 01/29/2014 0659   AMPHETMU NONE DETECTED 01/29/2014 0659   THCU NONE DETECTED 01/29/2014 0659   LABBARB NONE DETECTED 01/29/2014 0659    No results for input(s): ETH in the last 168 hours.  I have personally reviewed the radiological images below and agree with the radiology interpretations.  Ct Head Wo Contrast  02/01/2014  Stat CT scan shows no acute changes in the brain, but there appears to be a thrombus in the basilar artery that is new.  01/30/2014    1. Resolved right MCA territory hyperdensity present at 1056 hrs yesterday, most likely was post Neurointervention parenchymal contrast staining. 2. Mild cytotoxic edema suspected in that same right MCA territory. Underlying chronic ischemic changes. 3. Stable parafalcine subdural hematoma. No new intracranial hemorrhage or acute mass effect. 4. No new intracranial abnormality.    01/29/2014    Acute infarct right MCA territory as noted on CT perfusion study. There is interval development of high density in the right temporoparietal cortex which may be due to contrast material or hemorrhage. Continued follow-up recommended.  Small interhemispheric subdural hematoma is unchanged.    01/29/2014   1. 4 mm thick posterior falx subdural  hemorrhage. 2. Prominent density at the right M1/M2 junction. CTA is already planned. 3. Established infarcts in the anterior and posterior right MCA territory. No definitive acute infarct. 4. Left frontal scalp contusion.  No acute fracture.    Ct Cerebral Perfusion W/cm 01/29/2014    1. Occlusive thrombus at the right M1-M2 junction. Subjectively good pial/pial collateral circulation. 2. Perfusion findings suggest moderate acute infarct in the central right MCA territory with rim of penumbra, as above. 3. Remote infarcts at the anterior and posterior margins of the right MCA territory. 4. Thin posterior falx  subdural hemorrhage.     Cerebral Angiogram  01/29/2014 S/P Bilateral common carotid arteriograms, followed by complete revascularization of RT MCA M1 occlusion usingX2 passes with the Solitaire FR 64mm x 31mm retrieval device And 10 mg of  superselective intracranial IA Integrelin. TICI 3 flow reesablished.   MRI brain  02/02/14 1. New, acute infarcts in the posterior circulation related to treated basilar thrombosis. Infarcts present in the bilateral cerebellum, upper right brainstem, mesial right temporal lobe, and minimally along the right occipital cortex. Normal flow voids within the intracranial vessels. 2. Stable appearance of recent right MCA territory infarct. 3. Stable trace falcine subdural hemorrhage.  01/31/14 1. Acute nonhemorrhagic infarct involving the superior right temporal lobe, right insular ribbon, right frontal operculum, and posterior right frontal lobe. 2. More remote infarcts are again noted in the more anterior inferior right frontal lobe and the right parietal lobe. 3. Extensive white matter changes bilaterally likely reflect the sequela of chronic microvascular ischemia, advanced for age. 4. White matter changes extend into the brainstem. 5. Remote tender infarcts of the cerebellum bilaterally, worse on the right.  Dg Chest Port 1 View 02/08/2014    1. Right PICC line stable position. 2. Congestive heart failure with interstitial edema and small right pleural effusion. Mild improvement from prior exam. 02/10/2014 Improved bibasilar atelectasis versus airspace disease. 02/13/2014 - No pneumothorax status post bilateral chest tube placement. Stable support apparatus 02/19/2014- Resolved left basilar pneumothorax. Bilateral hazy airspace disease not significantly changed  2D Echocardiogram  EF 50-55% with no source of embolus.   CT abdomen:  IMPRESSION: 1. No mass is identified involving the colon. 2. Periumbilical hernia containing a portion of the  transverse colon as noted on the prior CT from August, 2015. There is mild thickening of the wall of the transverse colon as it enters the hernia sac, but there is no evidence of incarceration. 3. Small hiatal hernia. 4. Diffuse hepatic steatosis. 5. Prior aorto bi-iliac bypass graft with no complicating features. 6. Endometrial thickening up to approximately 11 mm, abnormal in a patient of this age. Does the patient have dysfunctional uterine bleeding? Pelvic ultrasound may be helpful in further evaluation to confirm or deny this finding.   Upper endoscopy results:  IMPRESSIONS: 1. jejunal AVM 2. Jejunal ulcer 3. Nonbleeding gastric polyps   A1C 6.4 and LDL 70   Physical Exam  General: The patient is alert and cooperative at the time of the examination.  Respiratory: Lung fields are clear  Cardiovascular: Occasional irregular heart rhythm  Abdomen: Positive bowel sounds, nontender  Skin: No significant peripheral edema is noted.   Neurologic Exam  Mental status: The patient is alert, cooperative at the time of examination.  Cranial nerves: Facial symmetry is present. Speech is normal, no aphasia or dysarthria is noted. Extraocular movements are full, with exception there may be some restriction of superior gaze. Visual fields are full.  Motor: The patient has  good strength in all 4 extremities.  Sensory examination: Soft touch sensation is symmetric on the face, arms, and legs.  Coordination: The patient has good finger-nose-finger and heel-to-shin bilaterally, with exception of slight dysmetria with finger-nose-finger on the left.  Gait and station: The gait was not tested.  Reflexes: Deep tendon reflexes are symmetric.   ASSESSMENT/PLAN Ms. Summer Bradley is a 69 y.o. female with history of hypertension, stroke, afib and COPD presenting with Left-sided weakness. She did not receive IV t-PA due to delay in arrival and ? SDH. But underwent mechanical  thrombectomy of right MCA with complete vascularization of right MCA M1 occlusion with IA Integrilin. 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 Dr. Estanislado Pandy was able to extract the clot, and the patient has sustained a right pontine and midbrain stroke, small bilateral cerebellar strokes, and some extension of the infarct in the mesial temporal area on the right. S/p trach and peg   Stroke:  R MCA infarct secondary to R MCA occlusion s/p revascularization with mechanical thrombectomy and IA integrelin. 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 Infarcts embolic secondary to Afib with subtherapeutic INR on coumadin.  Resultant left arm weakness and left facial droop  2D Echo  EF 50-55% no source of embolus  HgbA1c 6.4  eliquis po for VTE prophylaxis if GI bleeding can be stopped  aspirin 81 mg orally every day and warfarin prior to admission, changed to eliquis for stroke prevention. Eliquis is on hold currently.  Neurologically stable   PT/OT on board and recommend SNF  Deposition - SNF following admission    Acute Respiratory Failure / COPD exacerbation   Intubated for neuro intervention-extubated 02/06/14  Once to the floor 12/23, overnight developed more respiratory distress and concerning for aspiration  12/24 Transferred to step down unit for further monitoring  Developed the respiratory distress again on 02/13/2014, intubated and transferred to neuro ICU  Trach done on 02/15/14. Off vent now.  unasyn discontinued  On Xopenex and mucomyst neb treatment  Atrial Fibrillation / A flutter with AV block  Home meds:  Warfarin  INR subtherapeutic on admission, INR 1.18   started Cardizem and s/p d/c amiodarone.   Rate controlled.  On eliquis for stroke prevention previously, currently on hold secondary to  GIB  Dysphagia  Aspiration pneumonia  Developed a respiratory distress again on 02/13/2014, intubated and transferred to neuro ICU. Concerning for aspiration again.   PEG done 02/15/14. TF restarted  Speech following  Positive stool occult blood test   Hb 7.8  Close monitoring  Sun-downing  d/c sitter  d/c restraint  tapered off clonazepam  low dose seroquel   Hypertension  Continue to monitor  BP stable 120-140  On bisoprolol  Hyperlipidemia  Home meds:  Mevacor  LDL 70, goal < 70  On pravastatin  Continue statin on d/c  Trush  Fluconazole for 14 days  Started on 02/10/2014  Other Stroke Risk Factors  Advanced age  Obesity, Body mass index is 32.69 kg/(m^2).   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 The AVM was cauterized. Hospital day # 43    The patient is off Eliquis currently. We will need to follow hemoglobin, white count over  time. Check chemistries, hypokalemia noted previously. K was 2.5, on suplimentation. Will give low dose alprazolam for anxiety and agitation.  Summer Bradley [phone] 03/04/2014 9:37 AM    To contact Stroke Continuity provider, please refer to [email or web address] After hours, contact General Neurology

## 2014-03-04 NOTE — Progress Notes (Signed)
CRITICAL VALUE ALERT  Critical value received:  ptassium  Date of notification: 03/05/14  Time of notification:  0700  Critical value read back: yes  Nurse who received alert: Rida Loudin  MD notified (1st page):  yes  Time of first page:  0710  MD notified (2nd page):yes  Time of second page:0730  Responding MD:  Leonie Man  Time MD responded:  [phone]

## 2014-03-04 NOTE — Progress Notes (Signed)
Progress Note   Subjective  **No rectal bleeding or melena.  Passing loose brown stool   Objective  Vital signs in last 24 hours: Temp:  [97.4 F (36.3 C)-98.9 F (37.2 C)] 97.8 F (36.6 C) (01/17 1118) Pulse Rate:  [72-144] 144 (01/17 1121) Resp:  [21-32] 30 (01/17 1121) BP: (91-111)/(50-66) 104/55 mmHg (01/17 1121) SpO2:  [85 %-100 %] 98 % (01/17 1121) Last BM Date: 03/04/14 General:   Alert,  Well-developed,   in NAD Heart:  Regular rate and rhythm; no murmurs Abdomen:  Soft, nontender and nondistended. Normal bowel sounds, without guarding, and without rebound.   Extremities:  Without edema. Neurologic:  Alert and  oriented x4;  grossly normal neurologically. Psych:  Alert and cooperative. Normal mood and affect.  Intake/Output from previous day: 01/16 0701 - 01/17 0700 In: 2000 [P.O.:70; I.V.:1810; NG/GT:120] Out: -  Intake/Output this shift:    Lab Results:  Recent Labs  03/02/14 0650 03/02/14 1300 03/04/14 0550  WBC 12.6* 13.0* 9.7  HGB 8.1* 8.0* 7.8*  HCT 27.8* 27.4* 26.4*  PLT 222 222 248   BMET  Recent Labs  03/01/14 1805 03/04/14 0550  NA 141 142  K 3.1* 2.5*  CL 96 103  CO2 35* 32  GLUCOSE 139* 121*  BUN 34* 12  CREATININE 0.79 0.56  CALCIUM 8.1* 7.7*   LFT No results for input(s): PROT, ALBUMIN, AST, ALT, ALKPHOS, BILITOT, BILIDIR, IBILI in the last 72 hours. PT/INR  Recent Labs  03/01/14 1805  LABPROT 16.2*  INR 1.29   Hepatitis Panel No results for input(s): HEPBSAG, HCVAB, HEPAIGM, HEPBIGM in the last 72 hours.  Studies/Results: Ct Abdomen Pelvis W Contrast  03/03/2014   CLINICAL DATA:  Acute upper GI bleeding with melena. Upper endoscopy demonstrated several small-bowel AVMs. Colonoscopy questioned altered anatomy of the cecum or ascending colon.  EXAM: CT ABDOMEN AND PELVIS WITH CONTRAST  TECHNIQUE: Multidetector CT imaging of the abdomen and pelvis was performed using the standard protocol following bolus administration  of intravenous contrast.  CONTRAST:  100 ml Omnipaque 300 IV. Oral contrast was also administered.  COMPARISON:  09/22/2013.  FINDINGS: Lipoma involving the ileocecal valve. No mass identified involving the colon. Periumbilical hernia containing fat and a portion of the transverse colon as noted on the prior CT, with focal wall thickening involving the colon as it enters the hernia. No pericolonic edema or inflammation in this region to suggest acute incarceration or strangulation. No evidence of proximal colonic distention to suggest obstruction.  Small hiatal hernia. Gastrostomy tube enters the proximal body of the stomach, and no complicating features are present. Small bowel normal in appearance. No ascites.  Diffuse hepatic steatosis without focal hepatic parenchymal abnormality. Normal-appearing spleen, pancreas, adrenal glands, and right kidney. Small cortical cysts involving the otherwise normal-appearing left kidney. Gallbladder unremarkable by CT. No biliary ductal dilation. Prior aorto bi-iliac bypass graft with wide patency of the graft and no complicating features. No significant lymphadenopathy.  Urinary bladder mildly distended and normal in appearance. Endometrial thickening and/or fluid up to approximately 11 mm. No uterine mass. Normal-appearing ovaries. No free pelvic fluid.  Bone window images demonstrate lower thoracic spondylosis, multilevel degenerative disc disease, spondylosis and facet degenerative changes throughout the lumbar spine, degenerative changes in the right sacroiliac joint, and mild degenerative changes in both hips. Very small right pleural effusion with minimal atelectasis in the right lower lobe. Scarring in the lingula. Visualized lung bases otherwise clear. Heart moderately enlarged.  IMPRESSION: 1. No  mass is identified involving the colon. 2. Periumbilical hernia containing a portion of the transverse colon as noted on the prior CT from August, 2015. There is mild  thickening of the wall of the transverse colon as it enters the hernia sac, but there is no evidence of incarceration. 3. Small hiatal hernia. 4. Diffuse hepatic steatosis. 5. Prior aorto bi-iliac bypass graft with no complicating features. 6. Endometrial thickening up to approximately 11 mm, abnormal in a patient of this age. Does the patient have dysfunctional uterine bleeding? Pelvic ultrasound may be helpful in further evaluation to confirm or deny this finding.   Electronically Signed   By: Evangeline Dakin M.D.   On: 03/03/2014 17:12      Assessment & Plan  *1.  Melena - most likely secondary to small bowel AVM - s/p cauterization with APC.  She could have AVMs further down in GI tract but cannot confirm without a double balloon enteroscopy.  No need for this at this time.  May cautiously restart anticoagulants.  Recommend 1 dose of IV iron since she appears to be Fe deficient, then begin oral Fe supplementation. ** Active Problems:   Stroke   Respiratory failure   Cerebral infarction due to occlusion or stenosis of precerebral artery   Mixed simple and mucopurulent chronic bronchitis   Paroxysmal atrial fibrillation   Acute respiratory failure   Wheezing   Acute tracheobronchitis   Atelectasis of left lung   Dysphagia, pharyngoesophageal phase   SOB (shortness of breath)   HLD (hyperlipidemia)   Essential hypertension   Thrush   COPD exacerbation   Aspiration pneumonitis   Spontaneous pneumothorax   Acute on chronic respiratory failure   Tracheostomy status   Melena   LGI bleed   Acute posthemorrhagic anemia   Benign neoplasm of colon   Diverticulosis of colon without hemorrhage   Arteriovenous malformation of jejunum   Jejunal ulcer   Gastric polyps     LOS: 34 days   Erskine Emery  03/04/2014, 11:55 AM

## 2014-03-04 NOTE — Progress Notes (Signed)
Pt's potasium  is 2.5 this morning, MD paged, awaiting a call back, on-coming rn has been made aware and will follow up.-----Christhoper Busbee, rn

## 2014-03-05 ENCOUNTER — Inpatient Hospital Stay (HOSPITAL_COMMUNITY): Payer: Medicare HMO

## 2014-03-05 ENCOUNTER — Encounter (HOSPITAL_COMMUNITY): Payer: Self-pay | Admitting: Gastroenterology

## 2014-03-05 DIAGNOSIS — K31811 Angiodysplasia of stomach and duodenum with bleeding: Secondary | ICD-10-CM

## 2014-03-05 DIAGNOSIS — K289 Gastrojejunal ulcer, unspecified as acute or chronic, without hemorrhage or perforation: Secondary | ICD-10-CM

## 2014-03-05 DIAGNOSIS — K922 Gastrointestinal hemorrhage, unspecified: Secondary | ICD-10-CM | POA: Insufficient documentation

## 2014-03-05 LAB — TYPE AND SCREEN
ABO/RH(D): O POS
Antibody Screen: NEGATIVE
Unit division: 0
Unit division: 0

## 2014-03-05 LAB — CBC WITH DIFFERENTIAL/PLATELET
Basophils Absolute: 0 10*3/uL (ref 0.0–0.1)
Basophils Relative: 0 % (ref 0–1)
Eosinophils Absolute: 0.4 10*3/uL (ref 0.0–0.7)
Eosinophils Relative: 5 % (ref 0–5)
HCT: 28.1 % — ABNORMAL LOW (ref 36.0–46.0)
Hemoglobin: 8.1 g/dL — ABNORMAL LOW (ref 12.0–15.0)
Lymphocytes Relative: 18 % (ref 12–46)
Lymphs Abs: 1.6 10*3/uL (ref 0.7–4.0)
MCH: 23.1 pg — ABNORMAL LOW (ref 26.0–34.0)
MCHC: 28.8 g/dL — ABNORMAL LOW (ref 30.0–36.0)
MCV: 80.1 fL (ref 78.0–100.0)
Monocytes Absolute: 0.5 10*3/uL (ref 0.1–1.0)
Monocytes Relative: 6 % (ref 3–12)
Neutro Abs: 6.2 10*3/uL (ref 1.7–7.7)
Neutrophils Relative %: 71 % (ref 43–77)
Platelets: 251 10*3/uL (ref 150–400)
RBC: 3.51 MIL/uL — ABNORMAL LOW (ref 3.87–5.11)
RDW: 20 % — ABNORMAL HIGH (ref 11.5–15.5)
WBC: 8.8 10*3/uL (ref 4.0–10.5)

## 2014-03-05 LAB — BASIC METABOLIC PANEL
Anion gap: 3 — ABNORMAL LOW (ref 5–15)
BUN: 13 mg/dL (ref 6–23)
CO2: 32 mmol/L (ref 19–32)
Calcium: 8 mg/dL — ABNORMAL LOW (ref 8.4–10.5)
Chloride: 106 mEq/L (ref 96–112)
Creatinine, Ser: 0.54 mg/dL (ref 0.50–1.10)
GFR calc Af Amer: >90 mL/min (ref >90)
GFR calc non Af Amer: >90 mL/min (ref >90)
Glucose, Bld: 127 mg/dL — ABNORMAL HIGH (ref 70–99)
Potassium: 3.1 mmol/L — ABNORMAL LOW (ref 3.5–5.1)
Sodium: 141 mmol/L (ref 135–145)

## 2014-03-05 MED ORDER — BISOPROLOL FUMARATE 5 MG PO TABS: 2.5000 mg | ORAL_TABLET | Freq: Two times a day (BID) | ORAL | Status: DC

## 2014-03-05 MED ORDER — SODIUM CHLORIDE 3 % IN NEBU
4.0000 mL | INHALATION_SOLUTION | Freq: Four times a day (QID) | RESPIRATORY_TRACT | Status: DC
  Filled 2014-03-05 (×3): qty 4

## 2014-03-05 MED ORDER — PRO-STAT SUGAR FREE PO LIQD: 30.0000 mL | Freq: Two times a day (BID) | ORAL | Status: DC

## 2014-03-05 MED ORDER — FERROUS SULFATE 325 (65 FE) MG PO TABS: 325.0000 mg | ORAL_TABLET | Freq: Two times a day (BID) | ORAL | Status: DC

## 2014-03-05 MED ORDER — SODIUM CHLORIDE 3 % IN NEBU
5.0000 mL | INHALATION_SOLUTION | Freq: Four times a day (QID) | RESPIRATORY_TRACT | Status: DC
  Filled 2014-03-05 (×4): qty 8

## 2014-03-05 MED ORDER — APIXABAN 2.5 MG PO TABS: 2.5000 mg | ORAL_TABLET | Freq: Two times a day (BID) | ORAL | Status: DC

## 2014-03-05 MED ORDER — VITAL AF 1.2 CAL PO LIQD: 1000.0000 mL | ORAL | Status: DC

## 2014-03-05 MED ORDER — DILTIAZEM HCL 60 MG PO TABS: 60.0000 mg | ORAL_TABLET | Freq: Four times a day (QID) | ORAL | Status: DC

## 2014-03-05 MED ORDER — QUETIAPINE FUMARATE 25 MG PO TABS: 12.5000 mg | ORAL_TABLET | Freq: Every day | ORAL | Status: DC

## 2014-03-05 MED ORDER — SODIUM CHLORIDE 3 % IN NEBU: 4.0000 mL | INHALATION_SOLUTION | Freq: Four times a day (QID) | RESPIRATORY_TRACT | Status: DC

## 2014-03-05 MED ORDER — LEVALBUTEROL HCL 0.63 MG/3ML IN NEBU: 0.6300 mg | INHALATION_SOLUTION | Freq: Four times a day (QID) | RESPIRATORY_TRACT | Status: DC | PRN

## 2014-03-05 MED ORDER — FUROSEMIDE 20 MG PO TABS: 20.0000 mg | ORAL_TABLET | Freq: Two times a day (BID) | ORAL | Status: DC

## 2014-03-05 MED ORDER — APIXABAN 5 MG PO TABS: 5.0000 mg | ORAL_TABLET | Freq: Two times a day (BID) | ORAL | Status: AC

## 2014-03-05 MED ORDER — POTASSIUM CHLORIDE CRYS ER 20 MEQ PO TBCR: 40.0000 meq | EXTENDED_RELEASE_TABLET | Freq: Two times a day (BID) | ORAL | Status: AC

## 2014-03-05 MED ORDER — PANTOPRAZOLE SODIUM 40 MG PO TBEC: 40.0000 mg | DELAYED_RELEASE_TABLET | Freq: Two times a day (BID) | ORAL | Status: DC

## 2014-03-05 MED ORDER — APIXABAN 2.5 MG PO TABS
2.5000 mg | ORAL_TABLET | Freq: Two times a day (BID) | ORAL | Status: DC
  Administered 2014-03-05: 2.5 mg via ORAL
  Filled 2014-03-05 (×2): qty 1

## 2014-03-05 MED ORDER — GUAIFENESIN 100 MG/5ML PO SYRP: 200.0000 mg | ORAL_SOLUTION | ORAL | Status: DC | PRN

## 2014-03-05 MED ORDER — CLONAZEPAM 0.5 MG PO TABS
0.2500 mg | ORAL_TABLET | Freq: Two times a day (BID) | ORAL | Status: DC
  Administered 2014-03-05: 0.25 mg via ORAL
  Filled 2014-03-05: qty 1

## 2014-03-05 NOTE — Procedures (Signed)
Objective Swallowing Evaluation: Modified Barium Swallowing Study  Patient Details  Name: Summer Bradley MRN: [id number] Date of Birth: [birth year]  Today's Date: 03/05/2014 Time: SLP Start Time (ACUTE ONLY): 1034-SLP Stop Time (ACUTE ONLY): 1051 SLP Time Calculation (min) (ACUTE ONLY): 17 min  Past Medical History:  Past Medical History  Diagnosis Date  . Hypertension   . Stroke   . A-fib   . COPD (chronic obstructive pulmonary disease)    Past Surgical History:  Past Surgical History  Procedure Laterality Date  . Abdominal surgery    . Radiology with anesthesia N/A 01/29/2014    Procedure: RADIOLOGY WITH ANESTHESIA;  Surgeon: Luanne Bras, MD;  Location: [facility];  Service: Radiology;  Laterality: N/A;  . Radiology with anesthesia N/A 02/01/2014    Procedure: RADIOLOGY WITH ANESTHESIA;  Surgeon: Rob Hickman, MD;  Location: [facility];  Service: Radiology;  Laterality: N/A;  . Tracheostomy  02/15/14    feinstein  . Esophagogastroduodenoscopy N/A 03/01/2014    Procedure: ESOPHAGOGASTRODUODENOSCOPY (EGD);  Surgeon: Inda Castle, MD;  Location: [facility];  Service: Endoscopy;  Laterality: N/A;   HPI:  HPI: 69 yo developed neuro changes 12/14/15with left side weakness that resulted in a fall. Found to have right MCA clot, taken to IR for removal and revascularization procedure. 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 Functionally, these deficits manifested as left buccal residue, reduced oral manipulation and control, anterior spillage, and wet phonation and cough associated with ice chips/thin liquids. Pt advanced to dysphagia 2, thin liquids on 12/16. 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 Reintubated 12/17-12/22. Revascularization 12/17. 12/18 MRI reveals new multi focal infarcts in the posterior circulation, affecting  the mesial right temporal lobe, right occipital cortex, bilateral mid and upper cerebellum and right upper pons and midbrain. FEES 12/23 with recs to begin dysphagia 1, honey-thick liquids. Pt subsequently with respiratory decline, made NPO. Repeat BSE 12/25 recommended resume Dys 1, honey liquids. SLP treatment session with breakfast 12/29 with max cues with feeding due to impulsivity, however no evidence of aspiraiton.  with reintubation 12/23 pm.One hour following session pt developed airway plugging, decreased responsiveness was reintubated 12/29 and received trach 12/31, tolerating trach collar and PMSV ordered.  No Data Recorded  Assessment / Plan / Recommendation CHL IP CLINICAL IMPRESSIONS 03/05/2014  Dysphagia Diagnosis Mild oral phase dysphagia;Mild pharyngeal phase dysphagia  Clinical impression Pt exhibits improved swallow function from FEES performed on 12/23. Mildly delayed oral prep, mastication and transit with mechanical soft texture. Pt confused and stating she is anxious needing frequent max-total multimodal cues for labial seal and oral coordination (keeping mouth open and dipping tongue in cup). Oropharyngeal timing of swallow initiation was WFL's across all consistencies. Mildly decreased laryngeal elevation and closure led to one episode of flash penetration with one of 6 nectar thick barium trials. Reduced tongue base retraction and laryngeal elevation led to mild-moderate vallecular and pyriform sinus residue which mostly clear with second swallow cues. SLP recommends continuing Dys 1 and honey thick liquids at present due to confusion and cognitive impairments. SLP will give trials nectar during therapy for likley upgrade at bedside soon to nectar thick.        CHL IP TREATMENT RECOMMENDATION 03/05/2014  Treatment Plan Recommendations (No Data)     CHL IP DIET RECOMMENDATION 03/05/2014  Diet Recommendations Dysphagia 1 (Puree);Honey-thick liquid  Liquid Administration via Cup   Medication  Administration Crushed with puree  Compensations Slow rate;Small sips/bites;Check for pocketing;Check for anterior loss  Postural Changes and/or Swallow Maneuvers Seated upright 90 degrees     CHL IP OTHER RECOMMENDATIONS 03/05/2014  Recommended Consults (None)  Oral Care Recommendations Oral care BID  Other Recommendations (None)     CHL IP FOLLOW UP RECOMMENDATIONS 03/05/2014  Follow up Recommendations Skilled Nursing facility     [facility] IP FREQUENCY AND DURATION 03/05/2014  Speech Therapy Frequency (ACUTE ONLY) min 2x/week  Treatment Duration 2 weeks     Pertinent Vitals/Pain No pain, anxiety    SLP Swallow Goals No flowsheet data found.  No flowsheet data found.    CHL IP REASON FOR REFERRAL 03/05/2014  Reason for Referral Objectively evaluate swallowing function     CHL IP ORAL PHASE 03/05/2014  Lips (None)  Tongue (None)  Mucous membranes (None)  Nutritional status (None)  Other (None)  Oxygen therapy (None)  Oral Phase Impaired  Oral - Pudding Teaspoon (None)  Oral - Pudding Cup (None)  Oral - Honey Teaspoon (None)  Oral - Honey Cup WFL  Oral - Honey Syringe (None)  Oral - Nectar Teaspoon WFL  Oral - Nectar Cup (None)  Oral - Nectar Straw (None)  Oral - Nectar Syringe (None)  Oral - Ice Chips (None)  Oral - Thin Teaspoon WFL  Oral - Thin Cup (None)  Oral - Thin Straw (None)  Oral - Thin Syringe (None)  Oral - Puree WFL  Oral - Mechanical Soft Delayed oral transit;Weak lingual manipulation  Oral - Regular (None)  Oral - Multi-consistency (None)  Oral - Pill (None)  Oral Phase - Comment (None)      CHL IP PHARYNGEAL PHASE 03/05/2014  Pharyngeal Phase (None)  Pharyngeal - Pudding Teaspoon (None)  Penetration/Aspiration details (pudding teaspoon) (None)  Pharyngeal - Pudding Cup (None)  Penetration/Aspiration details (pudding cup) (None)  Pharyngeal - Honey Teaspoon Pharyngeal residue - valleculae;Reduced tongue base retraction;Pharyngeal residue  - pyriform sinuses;Reduced laryngeal elevation  Penetration/Aspiration details (honey teaspoon) (None)  Pharyngeal - Honey Cup Pharyngeal residue - valleculae;Pharyngeal residue - pyriform sinuses;Reduced tongue base retraction;Reduced laryngeal elevation  Penetration/Aspiration details (honey cup) (None)  Pharyngeal - Honey Syringe (None)  Penetration/Aspiration details (honey syringe) (None)  Pharyngeal - Nectar Teaspoon (None)  Penetration/Aspiration details (nectar teaspoon) (None)  Pharyngeal - Nectar Cup Penetration/Aspiration during swallow;Pharyngeal residue - pyriform sinuses;Pharyngeal residue - valleculae;Reduced laryngeal elevation;Reduced tongue base retraction  Penetration/Aspiration details (nectar cup) Material enters airway, remains ABOVE vocal cords then ejected out  Pharyngeal - Nectar Straw (None)  Penetration/Aspiration details (nectar straw) (None)  Pharyngeal - Nectar Syringe (None)  Penetration/Aspiration details (nectar syringe) (None)  Pharyngeal - Ice Chips (None)  Penetration/Aspiration details (ice chips) (None)  Pharyngeal - Thin Teaspoon Pharyngeal residue - valleculae;Reduced tongue base retraction  Penetration/Aspiration details (thin teaspoon) Material does not enter airway  Pharyngeal - Thin Cup Pharyngeal residue - valleculae;Pharyngeal residue - pyriform sinuses;Reduced tongue base retraction;Reduced laryngeal elevation  Penetration/Aspiration details (thin cup) (None)  Pharyngeal - Thin Straw NT  Penetration/Aspiration details (thin straw) (None)  Pharyngeal - Thin Syringe (None)  Penetration/Aspiration details (thin syringe') (None)  Pharyngeal - Puree Pharyngeal residue - valleculae;Reduced tongue base retraction  Penetration/Aspiration details (puree) (None)  Pharyngeal - Mechanical Soft Pharyngeal residue - valleculae;Reduced tongue base retraction  Penetration/Aspiration details (mechanical soft) (None)  Pharyngeal - Regular (None)   Penetration/Aspiration details (regular) (None)  Pharyngeal - Multi-consistency (None)  Penetration/Aspiration details (multi-consistency) (None)  Pharyngeal - Pill (None)  Penetration/Aspiration details (pill) (None)  Pharyngeal Comment (None)     CHL IP CERVICAL ESOPHAGEAL PHASE 03/05/2014  Cervical Esophageal Phase WFL  Pudding Teaspoon (None)  Pudding Cup (None)  Honey Teaspoon (None)  Honey Cup (None)  Honey Syringe (None)  Nectar Teaspoon (None)  Nectar Cup (None)  Nectar Straw (None)  Nectar Syringe (None)  Thin Teaspoon (None)  Thin Cup (None)  Thin Straw (None)  Thin Syringe (None)  Cervical Esophageal Comment (None)    No flowsheet data found.         [name] 03/05/2014, 11:51 AM   Orbie Pyo Colvin Caroli[employer] [phone]

## 2014-03-05 NOTE — Clinical Social Work Note (Signed)
CSW reviewed chart. The previous CSW received authorization for the pt to transition to [facility] on 03/02/2014. The pt become medically unstable to discharge. CSW requested PT/OT to work with the pt in order to restart [facility] authorization (which takes up to 48hr). CSW informed by the PT secretary that PT/OT has to be reorder. CSW provided the MD with an update. PT/OT has been reorder. CSW will continue to follow and assist.   Summer Bradley, MSW, [place]

## 2014-03-05 NOTE — Plan of Care (Signed)
Problem: Discharge/Transitional Outcomes Goal: Barriers To Progression Addressed/Resolved Outcome: Not Met (add Reason) To snf for rehab    Goal: Educational Plan Complete Outcome: Not Met (add Reason) Continue rehab at snf Goal: Ability to attain medications upon leaving hospital Outcome: Not Met (add Reason) Sent to snf

## 2014-03-05 NOTE — Clinical Social Work Note (Signed)
CSW met pt and her daughter [name] at bedside. CSW introduce self and purpose of visit. CSW informed [name] that the pt will be discharge back to [facility] today. CSW and [name] discussed ambulance transport. CSW contact [facility] at [phone] to schedule transport for the pt. CSW upload the pt's discharge summary. Bedside RN can call reported to [phone] room 213A.   [place], MSW, [place]

## 2014-03-05 NOTE — Progress Notes (Signed)
Summer Bradley to be D/C'd Skilled nursing facility for rehab per MD order.  Discussed with the patient's daugther angie  and all questions fully answered.    Medication List    STOP taking these medications        aspirin EC 81 MG tablet     metoprolol 100 MG tablet  Commonly known as:  LOPRESSOR     omeprazole 20 MG capsule  Commonly known as:  PRILOSEC     verapamil 180 MG CR tablet  Commonly known as:  CALAN-SR      TAKE these medications        albuterol 108 (90 BASE) MCG/ACT inhaler  Commonly known as:  PROVENTIL HFA;VENTOLIN HFA  Inhale 1 puff into the lungs every 6 (six) hours as needed for wheezing or shortness of breath.     apixaban 2.5 MG Tabs tablet  Commonly known as:  ELIQUIS  Take 1 tablet (2.5 mg total) by mouth 2 (two) times daily.     apixaban 5 MG Tabs tablet  Commonly known as:  ELIQUIS  Take 1 tablet (5 mg total) by mouth 2 (two) times daily.  Start taking on:  03/07/2014     ATROVENT HFA 17 MCG/ACT inhaler  Generic drug:  ipratropium  Inhale 2 puffs into the lungs 4 (four) times daily.     bisoprolol 5 MG tablet  Commonly known as:  ZEBETA  Take 0.5 tablets (2.5 mg total) by mouth 2 (two) times daily.     budesonide-formoterol 160-4.5 MCG/ACT inhaler  Commonly known as:  SYMBICORT  Inhale 2 puffs into the lungs 2 (two) times daily.     CALCIUM-VITAMIN D PO  Take 1 tablet by mouth daily.     citalopram 20 MG tablet  Commonly known as:  CELEXA  Take 20 mg by mouth at bedtime.     clonazePAM 0.5 MG tablet  Commonly known as:  KLONOPIN  Take 0.5 mg by mouth daily as needed for anxiety.     diltiazem 60 MG tablet  Commonly known as:  CARDIZEM  Take 1 tablet (60 mg total) by mouth 4 (four) times daily.     feeding supplement (PRO-STAT SUGAR FREE 64) Liqd  Place 30 mLs into feeding tube 2 (two) times daily.     feeding supplement (VITAL AF 1.2 CAL) Liqd  Place 1,000 mLs into feeding tube continuous.     fluticasone 50 MCG/ACT nasal spray   Commonly known as:  FLONASE  Place 1 spray into both nostrils daily as needed for allergies.     furosemide 20 MG tablet  Commonly known as:  LASIX  Take 1 tablet (20 mg total) by mouth 2 (two) times daily.     guaifenesin 100 MG/5ML syrup  Commonly known as:  ROBITUSSIN  Take 10 mLs (200 mg total) by mouth every 4 (four) hours as needed for congestion.     levalbuterol 0.63 MG/3ML nebulizer solution  Commonly known as:  XOPENEX  Take 3 mLs (0.63 mg total) by nebulization every 6 (six) hours as needed for wheezing or shortness of breath.     levothyroxine 150 MCG tablet  Commonly known as:  SYNTHROID, LEVOTHROID  Take 150 mcg by mouth daily before breakfast.     lovastatin 10 MG tablet  Commonly known as:  MEVACOR  Take 10 mg by mouth daily.     multivitamin with minerals Tabs tablet  Take 1 tablet by mouth daily.     potassium chloride SA 20  MEQ tablet  Commonly known as:  K-DUR,KLOR-CON  Take 2 tablets (40 mEq total) by mouth 2 (two) times daily.     QUEtiapine 25 MG tablet  Commonly known as:  SEROQUEL  Take 0.5 tablets (12.5 mg total) by mouth at bedtime.     sodium chloride HYPERTONIC 3 % nebulizer solution  Take 4 mLs by nebulization every 6 (six) hours.        VVS, Skin clean, dry and intact without evidence of skin break down, no evidence of skin tears noted. PICC line capped, flushed,  intact. Site without signs and symptoms of complications.   An After Visit Summary was printed and given to the patient. Called facility [place] report. Packet given to [facility].  Patient escorted via stretcher, and D/C to snf via ambulance.  Pecolia Ades [facility] 03/05/2014 1:31 PM

## 2014-03-05 NOTE — Progress Notes (Signed)
    Progress Note   Subjective  sleepy this am but no complaints. No further bleeding per RN. Normal brown stool today   Objective   Vital signs in last 24 hours: Temp:  [97.5 F (36.4 C)-98.5 F (36.9 C)] 98.1 F (36.7 C) (01/18 0743) Pulse Rate:  [72-153] 153 (01/18 0605) Resp:  [28-38] 38 (01/18 0743) BP: (99-143)/(53-92) 109/55 mmHg (01/18 0743) SpO2:  [98 %-100 %] 100 % (01/18 0743) Last BM Date: 03/04/14 General:    Obese,white female in NAD Abdomen:  Soft, obese, nontender . Normal bowel sounds. PEG tube feeding in progress Extremities:  Without edema. Psych:  Cooperative.   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 Nonbleeding gastric polyps  RECOMMENDATIONS: iron supplementation CT scan to better delineate colonic anatomy    Assessment / Plan:     1. GI bleed, possible secondary to jejunal AVM vrs jejunal ulcer, both seen on enteroscopy. AVM cauterized. Eliquis has been restarted, she is at risk for recurrent GI bleeding from intestinal AVMs. Patient for transfer to NH soon  2. Acute on chronic anemia. Hgb has drifted slowly over last several days from 11 to 7.8 today but no further bleeding per RN. May benefit from oral iron upon discharge.   3. Multiple medical problems including but no limited to CVA, COPD, HTN. Developed embolic infarcts while hospitalized this admission. Now back on Eliquis    LOS: 35 days   Tye Savoy  03/05/2014, 11:22 AM  GI ATTENDING  Patient's case discussed with GI colleagues in morning report. Interval history and data reviewed. Patient seen and examined.  Family in room. No further bleeding. Back on Eliquis. Ready for discharge to rehabilitation. We will sign off.

## 2014-03-05 NOTE — Progress Notes (Signed)
Speech Language Pathology  Patient Details Name: Keyonni Percival MRN: [id number] DOB: Jul 03, 1945 Today's Date: 03/05/2014 Time:  -     MBS scheduled at 10:30 this am for possible improvements in swallow function and diet/liquid upgrade.    Orbie Pyo Tiger[employer] [phone]

## 2014-03-05 NOTE — Progress Notes (Signed)
OT Cancellation Note  Patient Details Name: Piera Downs MRN: [id number] DOB: 03-11-1945   Cancelled Treatment:    Reason Eval/Treat Not Completed: Other (comment) (Pt leaving for SNF today.)  Benito Mccreedy OTR/L [phone] 03/05/2014, 1:11 PM

## 2014-03-13 ENCOUNTER — Encounter (HOSPITAL_COMMUNITY): Payer: Self-pay | Admitting: Certified Registered"

## 2014-03-19 ENCOUNTER — Encounter: Payer: Self-pay | Admitting: Internal Medicine

## 2014-03-22 ENCOUNTER — Inpatient Hospital Stay (HOSPITAL_COMMUNITY): Admit: 2014-03-22 | Payer: Medicare HMO

## 2014-04-10 ENCOUNTER — Ambulatory Visit: Payer: Self-pay | Admitting: Internal Medicine

## 2014-04-13 ENCOUNTER — Emergency Department: Payer: Self-pay | Admitting: Emergency Medicine

## 2014-04-17 ENCOUNTER — Encounter
Admit: 2014-04-17 | Disposition: A | Discharge status: Home / Self Care | Payer: Self-pay | Attending: Internal Medicine | Admitting: Internal Medicine

## 2014-05-09 ENCOUNTER — Ambulatory Visit (INDEPENDENT_AMBULATORY_CARE_PROVIDER_SITE_OTHER): Payer: Medicare PPO | Admitting: Neurology

## 2014-05-09 ENCOUNTER — Encounter: Payer: Self-pay | Admitting: Neurology

## 2014-05-09 VITALS — BP 117/66 | HR 53

## 2014-05-09 DIAGNOSIS — I480 Paroxysmal atrial fibrillation: Secondary | ICD-10-CM | POA: Diagnosis not present

## 2014-05-09 DIAGNOSIS — I63411 Cerebral infarction due to embolism of right middle cerebral artery: Secondary | ICD-10-CM | POA: Insufficient documentation

## 2014-05-09 DIAGNOSIS — J439 Emphysema, unspecified: Secondary | ICD-10-CM | POA: Insufficient documentation

## 2014-05-09 DIAGNOSIS — I6312 Cerebral infarction due to embolism of basilar artery: Secondary | ICD-10-CM | POA: Diagnosis not present

## 2014-05-09 DIAGNOSIS — E785 Hyperlipidemia, unspecified: Secondary | ICD-10-CM | POA: Insufficient documentation

## 2014-05-09 DIAGNOSIS — I10 Essential (primary) hypertension: Secondary | ICD-10-CM | POA: Diagnosis not present

## 2014-05-09 NOTE — Patient Instructions (Addendum)
-   continue eliquis 5 mg twice a day and lovastatin for stroke prevention. - continue clonazepam PRN for anxiety - continue seroquel 12.5mg  Qhs if tolerating.  - Follow up with your primary care physician for stroke risk factor modification. Recommend maintain blood pressure goal <130/80, diabetes with hemoglobin A1c goal below 6.5% and lipids with LDL cholesterol goal below 70 mg/dL.  - follow up in 3 months.

## 2014-05-09 NOTE — Progress Notes (Signed)
STROKE NEUROLOGY FOLLOW UP NOTE  NAME: Tarhonda Hollenberg DOB: 1945/08/06  REASON FOR VISIT: stroke follow up HISTORY FROM: chart and daughter  Today we had the pleasure of seeing Carolan Avedisian in follow-up at our Neurology Clinic. Pt was accompanied by daughter.   History Summary Denise Washburn is a 69 y.o. female with history of hypertension, CVA, A. fib and COPD was admitted on 01/29/2014 for left-sided weakness. In the ER, a CT was obtained which shows evidence of a previous stroke, and a CT perfusion scan was obtained which shows a significant mismatch demonstrating an area of penumbra, though there is an smaller area of likely infarct core as well. Patient was not administered TPA secondary to being outside of the tPA window but she was taken to neuro intervention where she had complete revascularization of RT MCA M1 with TICI 3 flow reesablished. Afterwards, she was admitted to the neuro ICU. MRI showed moderate size right MCA infarct therefore her anticoagulation was on hold to avoid hemorrhagic transformation. 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 IR consulted again for mechanical thrombectomy and was able to extract the clot, and the patient has sustained a right pontine and midbrain stroke, small bilateral cerebellar strokes, and right PCA infarcts. She was intubated for the procedure and had prolonged ventilation due to difficulty off vent. Her neuro status improved quickly and eliquis was started. After extubation, she was transferred to floor. However, she still has respiratory distress and had aspiration pneumonia and again was intubated and transferred to ICU. Trach and PEG performed 02/15/14. She was stabilized and neuro stable and off the vent. CCM following pt with trach and eventually she was able to tolerate with trach collar and capping, she was off trach one week prior to discharge. Unfortunately, she developed melena and eliquis  stopped. 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 She was s/p APC. She did not need blood transfusion and H&H stable throughout. As per GI, her eliquis can be cautiously resumed. She was put on eliquis 2.5mg  bid for two days and then started full dose eliquis 5mg  bid on 03/07/14. She also developed delirium and sun downing, she was put on seroquel Qhs and Xananx tid with versed PRN. On discharge, she was continued on 12.5mg  Qhs seroquel, but Xanax and versed discontinued and resumed her home medication of clonazepam 0.5mg  PRN . Her respiratory issue much better, on nebulizations. CXR no consolidations. She was transferred to SNF in stable condition.  Interval History During the interval time, the patient had dramatic improvement. She is awake alert, quite talkative, in good spirit, still on nasal cannular with coughing, but neurologically she was intact except left hemianopia. She is ready to go home this Friday from nursing home. She is able to walk with walker independently. Her G-tube was put on by her one week ago, currently the wound heals well. She is able to eat without difficulty. She still on Eliquis for stroke prevention. Also on seroquel 12.5 at night with clonazepam when necessary for anxiety. Blood pressure in clinic today 117/66.  REVIEW OF SYSTEMS: Full 14 system review of systems performed and notable only for those listed below and in HPI above, all others are negative:  Constitutional:   Cardiovascular:  Ear/Nose/Throat:   Skin:  Eyes:   Respiratory:  Cough Gastroitestinal:   Genitourinary:  Hematology/Lymphatic:   Endocrine:  Musculoskeletal:   Allergy/Immunology:   Neurological:   Psychiatric:  Sleep:   The  following represents the patient's updated allergies and side effects list: Allergies  Allergen Reactions  . Lisinopril     Unknown reaction    The neurologically relevant items on the patient's problem list were reviewed on today's  visit.  Neurologic Examination  A problem focused neurological exam (12 or more points of the single system neurologic examination, vital signs counts as 1 point, cranial nerves count for 8 points) was performed.  Blood pressure 117/66, pulse 53, height 0' (0 m), weight 0 lb (0 kg).  General - Well nourished, well developed, in no apparent distress, frequent coughing on nasal cannula.  Ophthalmologic - fundi not visualized due to frequent eye movement.  Cardiovascular - Regular rate and rhythm, no irregular rhythm.  Mental Status -  Level of arousal and orientation to time, place, and person were intact. Language including expression, naming, repetition, comprehension was assessed and found intact. Fund of Knowledge was assessed and was intact.  Cranial Nerves II - XII - II - Visual field testing showed left hemianopia. III, IV, VI - Extraocular movements intact. V - Facial sensation intact bilaterally. VII - Facial movement testing showed mild left facial droop. VIII - Hearing & vestibular intact bilaterally. X - Palate elevates symmetrically. XI - Chin turning & shoulder shrug intact bilaterally. XII - Tongue protrusion intact.  Motor Strength - The patient's strength was normal in all extremities and pronator drift was absent except mild left hand dexterity difficulty.  Bulk was normal and fasciculations were absent.   Motor Tone - Muscle tone was assessed at the neck and appendages and was normal.  Reflexes - The patient's reflexes were normal in all extremities and she had no pathological reflexes.  Sensory - Light touch, temperature/pinprick were assessed and were normal.    Coordination - The patient had normal movements in the hands with no ataxia or dysmetria.  Tremor was absent.  Gait and Station - deferred due to safety concerns. Patient had no walker with her for this visit.  Data reviewed: I personally reviewed the images and agree with the radiology  interpretations.  Ct Head Wo Contrast  02/01/2014 Stat CT scan shows no acute changes in the brain, but there appears to be a thrombus in the basilar artery that is new.  01/30/2014 1. Resolved right MCA territory hyperdensity present at 1056 hrs yesterday, most likely was post Neurointervention parenchymal contrast staining. 2. Mild cytotoxic edema suspected in that same right MCA territory. Underlying chronic ischemic changes. 3. Stable parafalcine subdural hematoma. No new intracranial hemorrhage or acute mass effect. 4. No new intracranial abnormality.  01/29/2014 Acute infarct right MCA territory as noted on CT perfusion study. There is interval development of high density in the right temporoparietal cortex which may be due to contrast material or hemorrhage. Continued follow-up recommended. Small interhemispheric subdural hematoma is unchanged.  01/29/2014 1. 4 mm thick posterior falx subdural hemorrhage. 2. Prominent density at the right M1/M2 junction. CTA is already planned. 3. Established infarcts in the anterior and posterior right MCA territory. No definitive acute infarct. 4. Left frontal scalp contusion. No acute fracture.   Ct Cerebral Perfusion W/cm 01/29/2014 1. Occlusive thrombus at the right M1-M2 junction. Subjectively good pial/pial collateral circulation. 2. Perfusion findings suggest moderate acute infarct in the central right MCA territory with rim of penumbra, as above. 3. Remote infarcts at the anterior and posterior margins of the right MCA territory. 4. Thin posterior falx subdural hemorrhage.   Cerebral Angiogram  01/29/2014 S/P Bilateral common carotid  arteriograms, followed by complete revascularization of RT MCA M1 occlusion usingX2 passes with the Solitaire FR 57mm x 46mm retrieval device And 10 mg of superselective intracranial IA Integrelin. TICI 3 flow reesablished.   MRI brain  02/02/14 1. New, acute infarcts in the posterior circulation  related to treated basilar thrombosis. Infarcts present in the bilateral cerebellum, upper right brainstem, mesial right temporal lobe, and minimally along the right occipital cortex. Normal flow voids within the intracranial vessels. 2. Stable appearance of recent right MCA territory infarct. 3. Stable trace falcine subdural hemorrhage.  01/31/14 1. Acute nonhemorrhagic infarct involving the superior right temporal lobe, right insular ribbon, right frontal operculum, and posterior right frontal lobe. 2. More remote infarcts are again noted in the more anterior inferior right frontal lobe and the right parietal lobe. 3. Extensive white matter changes bilaterally likely reflect the sequela of chronic microvascular ischemia, advanced for age. 4. White matter changes extend into the brainstem. 5. Remote tender infarcts of the cerebellum bilaterally, worse on the right.  Dg Chest Port 1 View 02/08/2014 1. Right PICC line stable position. 2. Congestive heart failure with interstitial edema and small right pleural effusion. Mild improvement from prior exam. 02/10/2014 Improved bibasilar atelectasis versus airspace disease. 02/13/2014 - No pneumothorax status post bilateral chest tube placement. Stable support apparatus 02/19/2014- Resolved left basilar pneumothorax. Bilateral hazy airspace disease not significantly changed 03/05/2014 - Findings felt to represent a degree of congestive heart failure superimposed on appearing emphysematous change. No airspace consolidation. No pneumothorax.  2D Echocardiogram EF 50-55% with no source of embolus.   Small bowel enteroscopy -  1. jejunal AVM 2. Jejunal ulcer 3. Nonbleeding gastric polyps  Colonoscopy -  1. Mild diverticulosis was noted in the sigmoid colon 2. Internal hemorrhoids 3. nonbleeding: Polyp 4. The examination was otherwise normal no obvious colonic bleeding source  Endoscopy - Normal appearing esophagus and GE  junction, the stomach was well visualized and normal in appearance, normal appearing duodenum  A1C 6.4 and LDL 70  Assessment: As you may recall, she is a 69 y.o. Caucasian female with PMH of hypertension, CVA, A. fib and COPD was admitted on 01/29/2014 for right MCA stroke. She did not receive TPA due to on window but received mechanical thrombectomy with complete re-vascularization. 2 days later, she developed basilar artery occlusion which required again mechanical thrombectomy. MRI showed right moderate right MCA stroke with posterior stroke including right PCA, right brainstem, and bilateral cerebellum. She had complicated and extensive hospitalization with multiple combinations including aspiration pneumonia requiring intubation and tracheostomy, dysphagia requiring PEG tube placement, GI bleeding requiring endoscopy and colonoscopy and APC procedure. Eventually she was off trach, restarted Eliquis, and stabilize before discharge to SNF. She made a dramatic recovery in SNF, and currently she only had left hemianopia with mild left facial droop and mild left hand dexterity difficulty. She is ready to discharge from SNF to home in the next couple days. She is able to walk with walker. We'll continue Eliquis and statin for stroke prevention.   Plan:  - Continue Eliquis 5 mg bid and lovastatin for stroke prevention - Continue seroquel 12.5mg  Qhs and clonazepam when necessary - Follow up with your primary care physician for stroke risk factor modification. Recommend maintain blood pressure goal <130/80, diabetes with hemoglobin A1c goal below 6.5% and lipids with LDL cholesterol goal below 70 mg/dL.  - RTC in 3 months  No orders of the defined types were placed in this encounter.    Meds ordered  this encounter  Medications  . Calcium Carbonate-Vitamin D 600-400 MG-UNIT per tablet    Sig: Take 1 tablet by mouth daily.  Marland Kitchen acetaminophen (TYLENOL) 325 MG tablet    Sig: Take 650 mg by mouth every 6  (six) hours as needed (TAKE 2 TABLETS EVERY 6 HOURS AS NEEDED).  Marland Kitchen ALUMINUM & MAGNESIUM HYDROXIDE PO    Sig: Take 480 mg by mouth daily.  . promethazine (PHENERGAN) 25 MG tablet    Sig: Take 25 mg by mouth every 6 (six) hours as needed for nausea or vomiting.  . Heparin Lock Flush (HEPARIN FLUSH) 10 UNIT/ML SOLN injection    Sig: Inject 10 Units into the vein as directed.    Patient Instructions  - continue eliquis 5 mg twice a day and lovastatin for stroke prevention. - continue clonazepam PRN for anxiety - continue seroquel 12.5mg  Qhs if tolerating.  - Follow up with your primary care physician for stroke risk factor modification. Recommend maintain blood pressure goal <130/80, diabetes with hemoglobin A1c goal below 6.5% and lipids with LDL cholesterol goal below 70 mg/dL.  - follow up in 3 months.   Rosalin Hawking, MD PhD [facility] [address], [place], [place] [address] [phone]

## 2014-05-15 ENCOUNTER — Telehealth: Payer: Self-pay | Admitting: Neurology

## 2014-05-15 NOTE — Telephone Encounter (Signed)
Dr Erlinda Hong please advise

## 2014-05-15 NOTE — Telephone Encounter (Signed)
Discussed with [name] over the phone and she is going to fax over the home health orders to my office. And I will sign off the orders and fax back.  Rosalin Hawking, MD PhD Stroke Neurology 05/15/2014 4:34 PM

## 2014-05-15 NOTE — Telephone Encounter (Signed)
[name] with [facility] @ [phone], stated pt's home from [place] and has orders for RN, Speech, and PT.  Questioning if Dr. Erlinda Hong would sign off on home health orders.  Please call and advise.

## 2014-05-18 ENCOUNTER — Encounter
Admit: 2014-05-18 | Disposition: A | Discharge status: Home / Self Care | Payer: Self-pay | Attending: Internal Medicine | Admitting: Internal Medicine

## 2014-06-09 NOTE — H&P (Signed)
PATIENT NAME:  Summer Bradley, BARRINGTON MR#:  [id number] DATE OF BIRTH:  [birth year]  DATE OF ADMISSION:  01/10/2014   PRIMARY CARE PHYSICIAN: Ngwe A. Clide Deutscher, MD.  CHIEF COMPLAINT: Shortness of breath.   HISTORY OF PRESENT ILLNESS: A 69 year old female with chronic respiratory failure, on 2 liters of oxygen due to COPD, hypothyroidism, atrial fibrillation, on Coumadin, who presents with the above complaint. The patient has chronic dyspnea and shortness of breath; however, over the past 24 hours, she has had increasing shortness of breath, dyspnea on exertion, so she came into the ER for further evaluation. In the ER, she was treated for COPD exacerbation with nebulizers and steroids; however, on ambulation, she desaturates to 87% on her chronic 2 liters oxygen, so was asked for hospital consult admission.   REVIEW OF SYSTEMS: CONSTITUTIONAL: No fever. Positive fatigue, weakness.  EYES:  No blurred or double vision  .  ENT: No ear pain, hearing loss, snoring, postnasal drip. RESPIRATORY:  Minimal cough, minimal wheezing. Positive history of COPD with chronic respiratory failure, positive dyspnea.  CARDIOVASCULAR: No chest pain, orthopnea, positive lower extremity edema over the past 3 weeks.  Positive dyspnea on exertion. No palpitations GASTROINTESTINAL:  No nausea, vomiting, diarrhea, abdominal pain, melena, or ulcers. GENITOURINARY:  No dysuria or hematuria.  ENDOCRINE: No polyuria or polydipsia. HEMATOLOGIC/LYMPHATIC:  No bleeding or swollen glands.  SKIN: Rash or lesions.  MUSCULOSKELETAL: No limited activity. NEUROLOGIC:  No history of CVA, positive TIA.  PSYCHIATRIC: Positive anxiety and depression.   PAST MEDICAL HISTORY:   1.  COPD with chronic respiratory failure on chronic oxygen 2 liters.  2.  Hypothyroidism.  3.  Atrial fibrillation on Coumadin.  4.  Anxiety and depression.  5.  History of brain aneurysm.  6.  History of AAA.  7.  History of TIA.  8.  GERD.  ALLERGIES: LISINOPRIL,  CAUSES A COUGH.   MEDICATIONS: 1.  Coumadin 5 mg, 1 to 1.5 tablets, 5 mg Monday, Wednesday, Saturday and 7.5 on Sunday, Tuesday, Thursday, Friday.  2.  Verapamil 180 daily.  3.  Ventolin HFA 2 puffs 4 times a day.  4.  Symbicort 2 puffs b.i.d.  5.  Omeprazole 20 mg daily.  6.  Multivitamin 1 tablet daily. 7.  Metoprolol 25 mg b.i.d.  8.  Lovastatin 10 mg daily.  9.  Losartan 25 mg daily.  10.  Synthroid 150 mcg daily.  11.  Lasix 20 mg daily. 12.  Clonazepam 0.5 mg daily p.r.n.  13.  Citalopram 20 mg daily.  14.  Calcium, vitamin D 1 tablet daily.  15.  Multivitamin 1 tablet daily.  16.  Atrovent HFA 2 puffs 4 times a day.  17.  Aspirin 81 mg daily.   SOCIAL HISTORY: The patient quit smoking 13 years ago. No alcohol or IV drug use.   FAMILY HISTORY: Positive for diabetes, hypothyroidism, and hypertension.   PAST SURGICAL HISTORY:  History of AAA repair.  PHYSICAL EXAMINATION:  VITAL SIGNS:  Temperature 98.4, pulse 79, respirations 22, blood pressure 125/55, 95% on 3 liters.  She did desaturate to 86%.  GENERAL: The patient is alert, oriented, not in acute distress.  HEENT: Head is atraumatic. Pupils are round and reactive. Sclerae anicteric. Mucous membranes are moist.  OROPHARYNX: Clear.  NECK: Supple without JVD, carotid bruit, or enlarged thyroid.  CARDIOVASCULAR: Regular rate and rhythm. No murmurs, gallops, or rubs. PMI is not displaced.  LUNGS: There is very  minimal wheezing. Fair air movement.  She does sound tight.  No dullness to percussion. No egophony or tactile fremitus.  GASTROINTESTINAL:  No tenderness or mass. No hepatosplenomegaly. Bowel sounds are positive in all quadrants. No rebound, guarding.  MUSCULOSKELETAL: No pathology of the digits of nails.  EXTREMITIES: Moves x4. No tenderness or effusion. Range of motion adequate, +2 plus pitting edema.  No cyanosis, clubbing.  SKIN: No rash or lesions. Well-hydrated no diaphoresis.  NEUROLOGIC:  Cranial nerves  II-XII are grossly intact. No focal deficits. Motor strength is 4/4 bilateral upper and lower extremity.  BACK: No CVA or vertebral tenderness.   LABORATORY DATA: INR is 2.6.  Sodium 142, potassium 3.9, chloride 107, bicarbonate 30, BUN 13, creatinine 0.82, glucose is 103, ALT 23, AST is 30, alkaline phosphatase is 72, total bilirubin 2.2, calcium 8.3, albumin is 3.1, total protein 7.3. White blood cells 10.4, hemoglobin 12, hematocrit 38, platelets are 418,000.    IMAGING DATA:  Chest x-ray shows COPD and no other acute cardiopulmonary disease.   EKG:  Shows sinus rhythm with PACs.   ASSESSMENT AND PLAN: A 69 year old female who presents with increasing shortness of breath, some mild wheezing, hypoxia on her chronic oxygen and dyspnea on exertion and a physical exam has lower extremity edema.  1.  Acute on chronic respiratory failure, likely secondary to chronic obstructive pulmonary disease exacerbation. The patient wears chronic oxygen; however, she did desaturate according to the ER. She also presented with increased respiratory rate, suspected all due to exacerbation. We will continue steroids.  She cannot take albuterol, so I have written for Xopenex.  We will continue her outpatient inhalers and add antibiotic Levaquin.  2.  Acute exacerbation of chronic obstructive pulmonary disease, as outlined above.  Will continue her outpatient inhalers.  I have added Levaquin, steroids, as well as Xopenex and will consult pulmonary, as per the patient's wish to establish a pulmonologist prior to discharge.  3.  Lower extremity edema. She has been describing increasing shortness of breath at baseline and she has lower extremity edema.  I will go ahead and order an echocardiogram to rule out any signs of congestive heart failure with probable right heart failure.  4.  Cystitis.  The patient does have a positive LCE, but no bacteria, so I do not think this is a urinary tract infection. I have ordered a urine  culture and will follow up on that.  5.  Atrial fibrillation. The patient is now in normal sinus rhythm. We will continue her Coumadin, as her INR is 2.6. Repeat a PT in the a.m. Continue outpatient medications.  6.  History of transient ischemic attacks. We will continue aspirin.  7.  History of hypothyroidism.  Will continue her Synthroid.  8.  Patient is full code status.   TIME SPENT: Approximately 45 minutes.   ____________________________ Donell Beers. Benjie Karvonen, MD spm:DT D: 01/10/2014 13:16:16 ET T: 01/10/2014 14:19:16 ET JOB#: [id number]  cc: Lorelee Mclaurin P. Benjie Karvonen, MD, <Dictator> Ngwe A. Clide Deutscher, MD Donell Beers Chun Sellen MD ELECTRONICALLY SIGNED 01/10/2014 15:53

## 2014-06-09 NOTE — Discharge Summary (Signed)
Dates of Admission and Diagnosis:  Date of Admission 10-Jan-2014   Date of Discharge 15-Jan-2014   Admitting Diagnosis Atreal fibrilation with rapid ventricular rate   Final Diagnosis Atreal fibrilation with rapid ventricular rate  Acute on chronic respiratory failure due to chronic obstructive pulmonary disease exacerbation- on 2 litre home oxygen Hypertension Chronic diastolic heart failure    Chief Complaint/History of Present Illness A 70 year old female with chronic respiratory failure, on 2 liters of oxygen due to COPD, hypothyroidism, atrial fibrillation, on Coumadin, who presents with the above complaint. The patient has chronic dyspnea and shortness of breath; however, over the past 24 hours, she has had increasing shortness of breath, dyspnea on exertion, so she came into the ER for further evaluation. In the ER, she was treated for COPD exacerbation with nebulizers and steroids; however, on ambulation, she desaturates to 87% on her chronic 2 liters oxygen, so was asked for hospital consult admission.   Allergies:  Lisinopril: Cough  Pertinent Past History:  Pertinent Past History 1.  COPD with chronic respiratory failure on chronic oxygen 2 liters.  2.  Hypothyroidism.  3.  Atrial fibrillation on Coumadin.  4.  Anxiety and depression.  5.  History of brain aneurysm.  6.  History of AAA.  7.  History of TIA.  8.  GERD.   Hospital Course:  Hospital Course 54 f with chronic obstructive pulmonary disease, chronic resp failure, chronic diastolic chf, hypertension , morbid ovesit here with SOB  * Afib with RVR - Due to COPD Continue Verapamil Increased metoprolol dose to 100 BID- now HR is little better. On coumadin  may follow with Dr. Humphrey Rolls in office.  * Acute on chronic resp failure due to chronic obstructive pulmonary disease exacerbation IV steroids, nebs, abx, O2  * Chronic diastolic chf Continue oral lasix. Has chronic edema. Increased lasix 20 to 40 daily  PO  * hypertension  * Morbid obesity d/c home today with HHA.   Condition on Discharge Stable   DISCHARGE INSTRUCTIONS HOME MEDS:  Medication Reconciliation: Patient's Home Medications at Discharge:     Medication Instructions  symbicort 160 mcg-4.5 mcg/inh inhalation aerosol  2 puff(s) inhaled 2 times a day   atrovent hfa cfc free 17 mcg/inh inhalation aerosol  2 puff(s) inhaled 4 times a day   ventolin hfa cfc free 90 mcg/inh inhalation aerosol  2 puff(s) inhaled 4 times a day, As Needed - for Wheezing, for Shortness of Breath    aspirin 81 mg oral delayed release tablet  1 tab(s) orally once a day (in the morning)   calcium-vitamin d  1 tab(s) orally once a day (in the morning)   furosemide 20 mg oral tablet  1 tab(s) orally once a day (in the morning)   levothyroxine 150 mcg (0.15 mg) oral tablet  1 tab(s) orally once a day (in the morning)   lovastatin 10 mg oral tablet  1 tab(s) orally once a day (in the morning)   multivitamin  1 tab(s) orally once a day (in the morning)   omeprazole 20 mg oral delayed release capsule  1 cap(s) orally once a day (in the morning)   verapamil 180 mg/24 hours oral capsule, extended release  1 cap(s) orally once a day (in the morning)   clonazepam 0.5 mg oral tablet  1 tab(s) orally once a day, As Needed - for Anxiety   citalopram 20 mg oral tablet  1 tab(s) orally once a day (at bedtime)   warfarin 1 mg  oral tablet  2.5 tab(s) orally once a day   metoprolol tartrate 100 mg oral tablet  1 tab(s) orally 2 times a day   prednisone 10 mg oral tablet  Start at 60 mg and taper by 10 mg daily until complete   levofloxacin 500 mg oral tablet  1 tab(s) orally once a day x 2 days    STOP TAKING THE FOLLOWING MEDICATION(S):    losartan 25 mg oral tablet: 1 tab(s) orally once a day (in the morning)  Physician's Instructions:  Home Health? Yes   Danube Therapy  Nurse   Home Oxygen? Yes   Oxygen delivery at home: 2L  Nasal  Cannula   Diet Low Sodium   Activity Limitations As tolerated   Return to Work Not Applicable   Time frame for Follow Up Appointment 1-2 weeks  Cardio, pulmonary and PMD   Other Comments Advised to check INR in next 1 week.     Neoma Laming Ali(Consultant): [employer] , [address] [place], [place] [address], [place]   Humphrey Rolls, Neelam(Consultant): [facility], [address] [place], [place] [address], [phone]   Tomasa Hose A(Family Physician):   TIME SPENT:  Total Time: Greater than 30 minutes   Electronic Signatures: Vaughan Basta (MD)  (Signed 04-Dec-15 21:22)  Authored: ADMISSION DATE AND DIAGNOSIS, CHIEF COMPLAINT/HPI, Allergies, PERTINENT PAST HISTORY, HOSPITAL COURSE, [place], PATIENT INSTRUCTIONS, Follow Up Physician, TIME SPENT   Last Updated: 04-Dec-15 21:22 by Vaughan Basta (MD)

## 2014-08-28 ENCOUNTER — Ambulatory Visit: Payer: Medicare PPO | Admitting: Neurology

## 2014-10-08 ENCOUNTER — Ambulatory Visit (INDEPENDENT_AMBULATORY_CARE_PROVIDER_SITE_OTHER): Payer: Medicare PPO | Admitting: Neurology

## 2014-10-08 ENCOUNTER — Encounter: Payer: Self-pay | Admitting: Neurology

## 2014-10-08 VITALS — BP 113/69 | HR 58 | Ht 67.0 in | Wt 182.6 lb

## 2014-10-08 DIAGNOSIS — E785 Hyperlipidemia, unspecified: Secondary | ICD-10-CM | POA: Diagnosis not present

## 2014-10-08 DIAGNOSIS — I6312 Cerebral infarction due to embolism of basilar artery: Secondary | ICD-10-CM | POA: Diagnosis not present

## 2014-10-08 DIAGNOSIS — I63411 Cerebral infarction due to embolism of right middle cerebral artery: Secondary | ICD-10-CM

## 2014-10-08 DIAGNOSIS — I480 Paroxysmal atrial fibrillation: Secondary | ICD-10-CM | POA: Diagnosis not present

## 2014-10-08 DIAGNOSIS — I10 Essential (primary) hypertension: Secondary | ICD-10-CM | POA: Diagnosis not present

## 2014-10-08 NOTE — Patient Instructions (Signed)
-   Continue Eliquis 5 mg bid and lovastatin for stroke prevention - Continue clonazepam when necessary for anxiety but limit use if possible. - Follow up with your primary care physician for stroke risk factor modification. Recommend maintain blood pressure goal <130/80, diabetes with hemoglobin A1c goal below 6.5% and lipids with LDL cholesterol goal below 70 mg/dL.  - follow up in 6 months.

## 2014-10-09 NOTE — Progress Notes (Signed)
STROKE NEUROLOGY FOLLOW UP NOTE  NAME: Summer Bradley DOB: 1945-06-20  REASON FOR VISIT: stroke follow up HISTORY FROM: chart and daughter  Today we had the pleasure of seeing Summer Bradley in follow-up at our Neurology Clinic. Pt was accompanied by daughter.   History Summary Summer Bradley is a 69 y.o. female with history of hypertension, CVA, A. fib and COPD was admitted on 01/29/2014 for left-sided weakness. In the ER, a CT was obtained which shows evidence of a previous stroke, and a CT perfusion scan was obtained which shows a significant mismatch demonstrating an area of penumbra, though there is an smaller area of likely infarct core as well. Patient was not administered TPA secondary to being outside of the tPA window but she was taken to neuro intervention where she had complete revascularization of RT MCA M1 with TICI 3 flow reesablished. Afterwards, she was admitted to the neuro ICU. MRI showed moderate size right MCA infarct therefore her anticoagulation was on hold to avoid hemorrhagic transformation. 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 IR consulted again for mechanical thrombectomy and was able to extract the clot, and the patient has sustained a right pontine and midbrain stroke, small bilateral cerebellar strokes, and right PCA infarcts. She was intubated for the procedure and had prolonged ventilation due to difficulty off vent. Her neuro status improved quickly and eliquis was started. After extubation, she was transferred to floor. However, she still has respiratory distress and had aspiration pneumonia and again was intubated and transferred to ICU. Trach and PEG performed 02/15/14. She was stabilized and neuro stable and off the vent. CCM following pt with trach and eventually she was able to tolerate with trach collar and capping, she was off trach one week prior to discharge. Unfortunately, she developed melena and eliquis  stopped. 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 She was s/p APC. She did not need blood transfusion and H&H stable throughout. As per GI, her eliquis can be cautiously resumed. She was put on eliquis 2.5mg  bid for two days and then started full dose eliquis 5mg  bid on 03/07/14. She also developed delirium and sun downing, she was put on seroquel Qhs and Xananx tid with versed PRN. On discharge, she was continued on 12.5mg  Qhs seroquel, but Xanax and versed discontinued and resumed her home medication of clonazepam 0.5mg  PRN . Her respiratory issue much better, on nebulizations. CXR no consolidations. She was transferred to SNF in stable condition.   05/09/14 follow-up - the patient had dramatic improvement. She is awake alert, quite talkative, in good spirit, still on nasal cannular with coughing, but neurologically she was intact except left hemianopia. She is ready to go home this Friday from nursing home. She is able to walk with walker independently. Her G-tube was put on by her one week ago, currently the wound heals well. She is able to eat without difficulty. She still on Eliquis for stroke prevention. Also on seroquel 12.5 at night with clonazepam when necessary for anxiety. Blood pressure in clinic today 117/66.  Interval History During the interval time, patient has been doing well, able to walk without any walker or cane this time. She has been discharged to home from SNF. Continued on eliquis 5mg  bid without difficulties. Visual field deficit on the left since has significant improvement.  REVIEW OF SYSTEMS: Full 14 system review of systems performed and notable only for those listed below and in HPI above, all  others are negative:  Constitutional:  Fatigue Cardiovascular:  Ear/Nose/Throat:  Runny nose Skin: Wounds Eyes:  Eye itching Respiratory:  Cough Gastroitestinal:   Genitourinary: Urgency Hematology/Lymphatic:   Endocrine:  Musculoskeletal:     Allergy/Immunology:   Neurological:   Psychiatric: Nervous, anxious Sleep:   The following represents the patient's updated allergies and side effects list: Allergies  Allergen Reactions  . Lisinopril     Unknown reaction    The neurologically relevant items on the patient's problem list were reviewed on today's visit.  Neurologic Examination  A problem focused neurological exam (12 or more points of the single system neurologic examination, vital signs counts as 1 point, cranial nerves count for 8 points) was performed.  Blood pressure 113/69, pulse 58, height 5\' 7"  (1.702 m), weight 182 lb 9.6 oz (82.827 kg).  General - Well nourished, well developed, in mild respiratory distress.  Ophthalmologic - fundi not visualized due to frequent eye movement.  Cardiovascular - Regular rate and rhythm, no irregular rhythm.  Mental Status -  Level of arousal and orientation to time, place, and person were intact. Language including expression, naming, repetition, comprehension was assessed and found intact. Fund of Knowledge was assessed and was intact.  Cranial Nerves II - XII - II - Visual field testing showed patchy left lower quadrant visual field deficit, much improved from last time. III, IV, VI - Extraocular movements intact. V - Facial sensation intact bilaterally. VII - Facial movement testing showed mild left facial droop. VIII - Hearing & vestibular intact bilaterally. X - Palate elevates symmetrically. XI - Chin turning & shoulder shrug intact bilaterally. XII - Tongue protrusion intact.  Motor Strength - The patient's strength was normal in all extremities and pronator drift was absent except mild left hand dexterity difficulty.  Bulk was normal and fasciculations were absent.   Motor Tone - Muscle tone was assessed at the neck and appendages and was normal.  Reflexes - The patient's reflexes were normal in all extremities and she had no pathological  reflexes.  Sensory - Light touch, temperature/pinprick were assessed and were normal.    Coordination - The patient had normal movements in the hands with no ataxia or dysmetria.  Tremor was absent.  Gait and Station - slow, stooped posturing, walk without cane or walker.  Data reviewed: I personally reviewed the images and agree with the radiology interpretations.  Ct Head Wo Contrast  02/01/2014 Stat CT scan shows no acute changes in the brain, but there appears to be a thrombus in the basilar artery that is new.  01/30/2014 1. Resolved right MCA territory hyperdensity present at 1056 hrs yesterday, most likely was post Neurointervention parenchymal contrast staining. 2. Mild cytotoxic edema suspected in that same right MCA territory. Underlying chronic ischemic changes. 3. Stable parafalcine subdural hematoma. No new intracranial hemorrhage or acute mass effect. 4. No new intracranial abnormality.  01/29/2014 Acute infarct right MCA territory as noted on CT perfusion study. There is interval development of high density in the right temporoparietal cortex which may be due to contrast material or hemorrhage. Continued follow-up recommended. Small interhemispheric subdural hematoma is unchanged.  01/29/2014 1. 4 mm thick posterior falx subdural hemorrhage. 2. Prominent density at the right M1/M2 junction. CTA is already planned. 3. Established infarcts in the anterior and posterior right MCA territory. No definitive acute infarct. 4. Left frontal scalp contusion. No acute fracture.   Ct Cerebral Perfusion W/cm 01/29/2014 1. Occlusive thrombus at the right M1-M2 junction. Subjectively good pial/pial  collateral circulation. 2. Perfusion findings suggest moderate acute infarct in the central right MCA territory with rim of penumbra, as above. 3. Remote infarcts at the anterior and posterior margins of the right MCA territory. 4. Thin posterior falx subdural hemorrhage.    Cerebral Angiogram  01/29/2014 S/P Bilateral common carotid arteriograms, followed by complete revascularization of RT MCA M1 occlusion usingX2 passes with the Solitaire FR 28mm x 62mm retrieval device And 10 mg of superselective intracranial IA Integrelin. TICI 3 flow reesablished.   MRI brain  02/02/14 1. New, acute infarcts in the posterior circulation related to treated basilar thrombosis. Infarcts present in the bilateral cerebellum, upper right brainstem, mesial right temporal lobe, and minimally along the right occipital cortex. Normal flow voids within the intracranial vessels. 2. Stable appearance of recent right MCA territory infarct. 3. Stable trace falcine subdural hemorrhage.  01/31/14 1. Acute nonhemorrhagic infarct involving the superior right temporal lobe, right insular ribbon, right frontal operculum, and posterior right frontal lobe. 2. More remote infarcts are again noted in the more anterior inferior right frontal lobe and the right parietal lobe. 3. Extensive white matter changes bilaterally likely reflect the sequela of chronic microvascular ischemia, advanced for age. 4. White matter changes extend into the brainstem. 5. Remote tender infarcts of the cerebellum bilaterally, worse on the right.  Dg Chest Port 1 View 02/08/2014 1. Right PICC line stable position. 2. Congestive heart failure with interstitial edema and small right pleural effusion. Mild improvement from prior exam. 02/10/2014 Improved bibasilar atelectasis versus airspace disease. 02/13/2014 - No pneumothorax status post bilateral chest tube placement. Stable support apparatus 02/19/2014- Resolved left basilar pneumothorax. Bilateral hazy airspace disease not significantly changed 03/05/2014 - Findings felt to represent a degree of congestive heart failure superimposed on appearing emphysematous change. No airspace consolidation. No pneumothorax.  2D Echocardiogram EF 50-55% with no  source of embolus.   Small bowel enteroscopy -  1. jejunal AVM 2. Jejunal ulcer 3. Nonbleeding gastric polyps  Colonoscopy -  1. Mild diverticulosis was noted in the sigmoid colon 2. Internal hemorrhoids 3. nonbleeding: Polyp 4. The examination was otherwise normal no obvious colonic bleeding source  Endoscopy - Normal appearing esophagus and GE junction, the stomach was well visualized and normal in appearance, normal appearing duodenum  A1C 6.4 and LDL 70  Assessment: As you may recall, she is a 69 y.o. Caucasian female with PMH of hypertension, CVA, A. fib and COPD was admitted on 01/29/2014 for right MCA stroke. She did not receive TPA due to on window but received mechanical thrombectomy with complete re-vascularization. 2 days later, she developed basilar artery occlusion which required again mechanical thrombectomy. MRI showed right moderate right MCA stroke with posterior stroke including right PCA, right brainstem, and bilateral cerebellum. She had complicated and extensive hospitalization with multiple combinations including aspiration pneumonia requiring intubation and tracheostomy, dysphagia requiring PEG tube placement, GI bleeding requiring endoscopy and colonoscopy and APC procedure. Eventually she was off trach, restarted Eliquis, and stabilize before discharge to SNF. She made a dramatic recovery in SNF and at home, and currently she only had patchy left lower quadrant visual field deficit and mild left hand dexterity difficulty. She is able to walk without walker or cane at this time. We'll continue Eliquis and statin for stroke prevention.   Plan:  - Continue Eliquis 5 mg bid and lovastatin for stroke prevention - Continue clonazepam when necessary for anxiety but limit use if possible. - Follow up with your primary care physician for stroke  risk factor modification. Recommend maintain blood pressure goal <130/80, diabetes with hemoglobin A1c goal below 6.5% and lipids  with LDL cholesterol goal below 70 mg/dL.  - RTC in 6 months.  No orders of the defined types were placed in this encounter.    Meds ordered this encounter  Medications  . citalopram (CELEXA) 40 MG tablet    Sig:   . diltiazem (DILACOR XR) 240 MG 24 hr capsule    Sig:     Patient Instructions  - Continue Eliquis 5 mg bid and lovastatin for stroke prevention - Continue clonazepam when necessary for anxiety but limit use if possible. - Follow up with your primary care physician for stroke risk factor modification. Recommend maintain blood pressure goal <130/80, diabetes with hemoglobin A1c goal below 6.5% and lipids with LDL cholesterol goal below 70 mg/dL.  - follow up in 6 months.    Rosalin Hawking, MD PhD [facility] [address], [place], [place] [address] [phone]

## 2015-03-23 ENCOUNTER — Emergency Department
Admission: EM | Admit: 2015-03-23 | Discharge: 2015-03-23 | Disposition: A | Discharge status: Home / Self Care | Payer: Medicare PPO | Attending: Emergency Medicine | Admitting: Emergency Medicine

## 2015-03-23 ENCOUNTER — Encounter: Payer: Self-pay | Admitting: Emergency Medicine

## 2015-03-23 ENCOUNTER — Emergency Department: Payer: Medicare PPO

## 2015-03-23 DIAGNOSIS — S0990XA Unspecified injury of head, initial encounter: Secondary | ICD-10-CM | POA: Diagnosis present

## 2015-03-23 DIAGNOSIS — Y998 Other external cause status: Secondary | ICD-10-CM | POA: Insufficient documentation

## 2015-03-23 DIAGNOSIS — S0101XA Laceration without foreign body of scalp, initial encounter: Secondary | ICD-10-CM | POA: Diagnosis not present

## 2015-03-23 DIAGNOSIS — Z7951 Long term (current) use of inhaled steroids: Secondary | ICD-10-CM | POA: Diagnosis not present

## 2015-03-23 DIAGNOSIS — H538 Other visual disturbances: Secondary | ICD-10-CM | POA: Diagnosis not present

## 2015-03-23 DIAGNOSIS — Z79899 Other long term (current) drug therapy: Secondary | ICD-10-CM | POA: Diagnosis not present

## 2015-03-23 DIAGNOSIS — W06XXXA Fall from bed, initial encounter: Secondary | ICD-10-CM | POA: Insufficient documentation

## 2015-03-23 DIAGNOSIS — Y9289 Other specified places as the place of occurrence of the external cause: Secondary | ICD-10-CM | POA: Insufficient documentation

## 2015-03-23 DIAGNOSIS — I10 Essential (primary) hypertension: Secondary | ICD-10-CM | POA: Insufficient documentation

## 2015-03-23 DIAGNOSIS — R531 Weakness: Secondary | ICD-10-CM | POA: Insufficient documentation

## 2015-03-23 DIAGNOSIS — Z87891 Personal history of nicotine dependence: Secondary | ICD-10-CM | POA: Diagnosis not present

## 2015-03-23 DIAGNOSIS — Y9389 Activity, other specified: Secondary | ICD-10-CM | POA: Insufficient documentation

## 2015-03-23 LAB — URINALYSIS COMPLETE WITH MICROSCOPIC (ARMC ONLY)
Bilirubin Urine: NEGATIVE
Glucose, UA: NEGATIVE mg/dL
Hgb urine dipstick: NEGATIVE
Ketones, ur: NEGATIVE mg/dL
Leukocytes, UA: NEGATIVE
Nitrite: NEGATIVE
Protein, ur: NEGATIVE mg/dL
Specific Gravity, Urine: 1.018 (ref 1.005–1.030)
pH: 5 (ref 5.0–8.0)

## 2015-03-23 MED ORDER — LIDOCAINE-EPINEPHRINE-TETRACAINE (LET) SOLUTION
3.0000 mL | Freq: Once | NASAL | Status: DC
  Filled 2015-03-23 (×2): qty 3

## 2015-03-23 NOTE — ED Notes (Signed)
Pt was sleeping rolled out of bed hitting left side of head. Lac noted with minimal bleeding.  Pt denies any pain, no neck or back pain.  Denies any LOC, pt able to move all extremities has left sided arm weakness from an old stroke.

## 2015-03-23 NOTE — ED Notes (Signed)
Patient discharged to home per MD order. Patient in stable condition, and deemed medically cleared by ED provider for discharge. Discharge instructions reviewed with patient/family using "Teach Back"; verbalized understanding of medication education and administration, and information about follow-up care. Denies further concerns. ° °

## 2015-03-23 NOTE — ED Provider Notes (Signed)
[facility] Emergency Department Provider Note  ____________________________________________  Time seen: Approximately 3:33 AM  I have reviewed the triage vital signs and the nursing notes.   HISTORY  Chief Complaint Laceration    HPI Summer Bradley is a 70 y.o. female who comes into the hospital today with a head injury. The patient has a history of 2 strokes and reports that she was sleeping when she rolled out of bed. The patient woke up when she hit her head. She currently has no complaints and reports that she felt well today. The patient does not know why she rolled out of bed and reports that she has not done it before. The patient reports that during the day she had been doing well and denies any dizziness, lightheadedness, chest pain or shortness of breath. The patient does have a history of some peripheral vision deficits and left arm weakness from her previous stroke.The patient has a scalp laceration. She is here for evaluation and treatment.    Past Medical History  Diagnosis Date  . Hypertension   . Stroke ([place])   . A-fib ([place])   . COPD (chronic obstructive pulmonary disease) ([place])   . High cholesterol   . Heart disease   . Anxiety     Patient Active Problem List   Diagnosis Date Noted  . Emphysema of lung ([place]) 05/09/2014  . 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 Hyperlipidemia 05/09/2014  . GI bleed   . Benign neoplasm of colon 03/03/2014  . Diverticulosis of colon without hemorrhage 03/03/2014  . 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 Jejunal ulcer 03/03/2014  . Gastric polyps 03/03/2014  . Acute posthemorrhagic anemia 03/02/2014  . Melena 02/27/2014  . LGI bleed   . Acute on chronic respiratory failure ([place]) 02/16/2014  . 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 Aspiration pneumonitis ([place])   .  Spontaneous pneumothorax   . Thrush   . COPD exacerbation ([place])   . SOB (shortness of breath)   . HLD (hyperlipidemia)   . Essential hypertension   . Dysphagia, pharyngoesophageal phase   . Acute tracheobronchitis 02/04/2014  . Atelectasis of left lung 02/04/2014  . Wheezing   . Acute respiratory failure ([place])   . Paroxysmal atrial fibrillation (H[facility])   . 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[place])   . Stroke ([place]) 01/29/2014  . Respiratory failure ([place]) 01/29/2014    Past Surgical History  Procedure Laterality Date  . Abdominal surgery    . Radiology with anesthesia N/A 01/29/2014    Procedure: RADIOLOGY WITH ANESTHESIA;  Surgeon: Luanne Bras, MD;  Location: [place];  Service: Radiology;  Laterality: N/A;  . Radiology with anesthesia N/A 02/01/2014    Procedure: RADIOLOGY WITH ANESTHESIA;  Surgeon: Rob Hickman, MD;  Location: [place] [facility]UR[facility];  Service: Radiology;  Laterality: N/A;  . Tracheostomy  02/15/14    [facility]  . Esophagogastroduodenoscopy N/A 03/01/2014    Procedure: ESOPHAGOGASTRODUODENOSCOPY (EGD);  Surgeon: Inda Castle, MD;  Location: [place];  Service: Endoscopy;  Laterality: N/A;  . Colonoscopy N/A 03/03/2014    Procedure: COLONOSCOPY;  Surgeon: Inda Castle, MD;  Location: [place];  Service: Endoscopy;  Laterality: N/A;  . Enteroscopy N/A 03/03/2014    Procedure: ENTEROSCOPY;  Surgeon: Inda Castle, MD;  Location: [facility]  ENDOSCOPY;  Service: Endoscopy;  Laterality: N/A;    Current Outpatient Rx  Name  Route  Sig  Dispense  Refill  . acetaminophen (TYLENOL) 325 MG tablet   Oral   Take 650 mg by mouth every 6 (six) hours as needed (TAKE 2 TABLETS EVERY 6 HOURS AS NEEDED).         Marland Kitchen albuterol (PROVENTIL HFA;VENTOLIN HFA) 108 (90 BASE) MCG/ACT inhaler   Inhalation   Inhale 1 puff into the lungs every 6 (six) hours as needed for wheezing or  shortness of breath.         Marland Kitchen apixaban (ELIQUIS) 5 MG TABS tablet   Oral   Take 1 tablet (5 mg total) by mouth 2 (two) times daily.   60 tablet   5   . bisoprolol (ZEBETA) 5 MG tablet   Oral   Take 0.5 tablets (2.5 mg total) by mouth 2 (two) times daily.   30 tablet   5   . budesonide-formoterol (SYMBICORT) 160-4.5 MCG/ACT inhaler   Inhalation   Inhale 2 puffs into the lungs 2 (two) times daily.         . Calcium Carbonate-Vitamin D 600-400 MG-UNIT per tablet   Oral   Take 1 tablet by mouth daily.         . citalopram (CELEXA) 40 MG tablet               . clonazePAM (KLONOPIN) 0.5 MG tablet   Oral   Take 0.5 mg by mouth daily as needed for anxiety.          Marland Kitchen diltiazem (DILACOR XR) 240 MG 24 hr capsule               . fluticasone (FLONASE) 50 MCG/ACT nasal spray   Each Nare   Place 1 spray into both nostrils daily as needed for allergies.          . furosemide (LASIX) 20 MG tablet   Oral   Take 1 tablet (20 mg total) by mouth 2 (two) times daily.   30 tablet   5   . guaifenesin (ROBITUSSIN) 100 MG/5ML syrup   Oral   Take 10 mLs (200 mg total) by mouth every 4 (four) hours as needed for congestion.   120 mL   0   . ipratropium (ATROVENT HFA) 17 MCG/ACT inhaler   Inhalation   Inhale 2 puffs into the lungs 4 (four) times daily.         Marland Kitchen levalbuterol (XOPENEX) 0.63 MG/3ML nebulizer solution   Nebulization   Take 3 mLs (0.63 mg total) by nebulization every 6 (six) hours as needed for wheezing or shortness of breath.   3 mL   12   . levothyroxine (SYNTHROID, LEVOTHROID) 150 MCG tablet   Oral   Take 150 mcg by mouth daily before breakfast.          . lovastatin (MEVACOR) 10 MG tablet   Oral   Take 10 mg by mouth daily.          . Multiple Vitamin (MULTIVITAMIN WITH MINERALS) TABS tablet   Oral   Take 1 tablet by mouth daily.         . potassium chloride SA (K-DUR,KLOR-CON) 20 MEQ tablet   Oral   Take 2 tablets (40 mEq total)  by mouth 2 (two) times daily.   30 tablet   0     Allergies Lisinopril  Family History  Problem Relation Age of Onset  .  Diabetes Mother     Social History Social History  Substance Use Topics  . Smoking status: Former Research scientist (life sciences)  . Smokeless tobacco: None     Comment: QUIT SMOKING "13 YEARS AGO"  . Alcohol Use: No    Review of Systems Constitutional: No fever/chills Eyes: No visual changes. ENT: No sore throat. Cardiovascular: Denies chest pain. Respiratory: Denies shortness of breath. Gastrointestinal: No abdominal pain.  No nausea, no vomiting.  No diarrhea.  No constipation. Genitourinary: Negative for dysuria. Musculoskeletal: Negative for back pain. Skin: scalp laceration Neurological: head injury  10-point ROS otherwise negative.  ____________________________________________   PHYSICAL EXAM:  VITAL SIGNS: ED Triage Vitals  Enc Vitals Group     BP 03/23/15 0320 135/92 mmHg     Pulse Rate 03/23/15 0320 58     Resp 03/23/15 0320 18     Temp 03/23/15 0320 97.9 F (36.6 C)     Temp Source 03/23/15 0320 Oral     SpO2 03/23/15 0320 100 %     Weight 03/23/15 0320 182 lb (82.555 kg)     Height 03/23/15 0320 5\' 2"  (1.575 m)     Head Cir --      Peak Flow --      Pain Score 03/23/15 0324 0     Pain Loc --      Pain Edu? --      Excl. in [place]? --     Constitutional: Alert and oriented. Well appearing and in mild distress. Eyes: Conjunctivae are normal. PERRL. EOMI. Head: Atraumatic. Nose: No congestion/rhinnorhea. Mouth/Throat: Mucous membranes are moist.  Oropharynx non-erythematous. Cardiovascular: Normal rate, regular rhythm. Grossly normal heart sounds.  Good peripheral circulation. Respiratory: Normal respiratory effort.  No retractions. Lungs CTAB. Gastrointestinal: Soft and nontender. No distention. Positive bowel sounds Musculoskeletal: No lower extremity tenderness nor edema.  No joint effusions. Neurologic:  Normal speech and language. Left sided  facial droop, left upper extremity weakness,  Skin:  Skin is warm, dry and intact. No rash noted. Psychiatric: Mood and affect are normal.   ____________________________________________   LABS (all labs ordered are listed, but only abnormal results are displayed)  Labs Reviewed  URINALYSIS COMPLETEWITH MICROSCOPIC ([facility] ONLY) - Abnormal; Notable for the following:    Color, Urine YELLOW (*)    APPearance CLEAR (*)    Bacteria, UA RARE (*)    Squamous Epithelial / LPF 0-5 (*)    All other components within normal limits   ____________________________________________  EKG  none ____________________________________________  RADIOLOGY  CT head and cervical spine: No evidence of acute intracranial or cervical spine injury, left parietal scalp laceration without fracture, remote right MCA and posterior circulation infarct as above. ____________________________________________   PROCEDURES  Procedure(s) performed: please, see procedure note(s).  LACERATION REPAIR Performed by: Charlesetta Ivory P Authorized by: Charlesetta Ivory P Consent: Verbal consent obtained. Risks and benefits: risks, benefits and alternatives were discussed Consent given by: patient Patient identity confirmed: provided demographic data Prepped and Draped in normal sterile fashion Wound explored  Laceration Location: left parietal scalp  Laceration Length: 4-5 cm  No Foreign Bodies seen or palpated  Anesthesia: local infiltration  Local anesthetic: LET topical anesthetic  Irrigation method: syringe Amount of cleaning: standard  Skin closure: staples  Number of sutures: 7  Technique: staples  Patient tolerance: Patient tolerated the procedure well with no immediate complications.   Critical Care performed: No  ____________________________________________   INITIAL IMPRESSION / ASSESSMENT AND PLAN / ED COURSE  Pertinent  labs & imaging results that were available during my care of  the patient were reviewed by me and considered in my medical decision making (see chart for details).  This is a 70 yo F who comes into the hospital with a head injury after rolling out of bed tonight. We performed a CT scan of the patient's head which was unremarkable. We also did check some urine which did not show any infection. The patient had staples placed in her scalp without any complication. The patient will be discharged home to follow-up with her primary care physician for staple removal in about 7-10 days.  ____________________________________________   FINAL CLINICAL IMPRESSION(S) / ED DIAGNOSES  Final diagnoses:  Head injury, initial encounter  Scalp laceration, initial encounter      Loney Hering, MD 03/23/15 [phone]

## 2015-03-23 NOTE — ED Notes (Signed)
Pt to CT

## 2015-03-23 NOTE — Discharge Instructions (Signed)
Head Injury, Adult You have a head injury. Headaches and throwing up (vomiting) are common after a head injury. It should be easy to wake up from sleeping. Sometimes you must stay in the hospital. Most problems happen within the first 24 hours. Side effects may occur up to 7-10 days after the injury.  WHAT ARE THE TYPES OF HEAD INJURIES? Head injuries can be as minor as a bump. Some head injuries can be more severe. More severe head injuries include:  A jarring injury to the brain (concussion).  A bruise of the brain (contusion). This mean there is bleeding in the brain that can cause swelling.  A cracked skull (skull fracture).  Bleeding in the brain that collects, clots, and forms a bump (hematoma). WHEN SHOULD I GET HELP RIGHT AWAY?   You are confused or sleepy.  You cannot be woken up.  You feel sick to your stomach (nauseous) or keep throwing up (vomiting).  Your dizziness or unsteadiness is getting worse.  You have very bad, lasting headaches that are not helped by medicine. Take medicines only as told by your doctor.  You cannot use your arms or legs like normal.  You cannot walk.  You notice changes in the black spots in the center of the colored part of your eye (pupil).  You have clear or bloody fluid coming from your nose or ears.  You have trouble seeing. During the next 24 hours after the injury, you must stay with someone who can watch you. This person should get help right away (call 911 in the [place] if you start to shake and are not able to control it (have seizures), you pass out, or you are unable to wake up. HOW CAN I PREVENT A HEAD INJURY IN THE FUTURE?  Wear seat belts.  Wear a helmet while bike riding and playing sports like football.  Stay away from dangerous activities around the house. WHEN CAN I RETURN TO NORMAL ACTIVITIES AND ATHLETICS? See your doctor before doing these activities. You should not do normal activities or play contact sports until 1  week after the following symptoms have stopped:  Headache that does not go away.  Dizziness.  Poor attention.  Confusion.  Memory problems.  Sickness to your stomach or throwing up.  Tiredness.  Fussiness.  Bothered by bright lights or loud noises.  Anxiousness or depression.  Restless sleep. MAKE SURE YOU:   Understand these instructions.  Will watch your condition.  Will get help right away if you are not doing well or get worse.   This information is not intended to replace advice given to you by your health care provider. Make sure you discuss any questions you have with your health care provider.   Document Released: 01/16/2008 Document Revised: 02/23/2014 Document Reviewed: 10/10/2012 Elsevier Interactive Patient Education 2016 [place], Adult A laceration is a cut that goes through all of the layers of the skin and into the tissue that is right under the skin. Some lacerations heal on their own. Others need to be closed with stitches (sutures), staples, skin adhesive strips, or skin glue. Proper laceration care minimizes the risk of infection and helps the laceration to heal better. HOW TO CARE FOR YOUR LACERATION If sutures or staples were used:  Keep the wound clean and dry.  If you were given a bandage (dressing), you should change it at least one time per day or as told by your health care provider. You should also  change it if it becomes wet or dirty.  Keep the wound completely dry for the first 24 hours or as told by your health care provider. After that time, you may shower or bathe. However, make sure that the wound is not soaked in water until after the sutures or staples have been removed.  Clean the wound one time each day or as told by your health care provider:  Wash the wound with soap and water.  Rinse the wound with water to remove all soap.  Pat the wound dry with a clean towel. Do not rub the wound.  After cleaning  the wound, apply a thin layer of antibiotic ointmentas told by your health care provider. This will help to prevent infection and keep the dressing from sticking to the wound.  Have the sutures or staples removed as told by your health care provider. If skin adhesive strips were used:  Keep the wound clean and dry.  If you were given a bandage (dressing), you should change it at least one time per day or as told by your health care provider. You should also change it if it becomes dirty or wet.  Do not get the skin adhesive strips wet. You may shower or bathe, but be careful to keep the wound dry.  If the wound gets wet, pat it dry with a clean towel. Do not rub the wound.  Skin adhesive strips fall off on their own. You may trim the strips as the wound heals. Do not remove skin adhesive strips that are still stuck to the wound. They will fall off in time. If skin glue was used:  Try to keep the wound dry, but you may briefly wet it in the shower or bath. Do not soak the wound in water, such as by swimming.  After you have showered or bathed, gently pat the wound dry with a clean towel. Do not rub the wound.  Do not do any activities that will make you sweat heavily until the skin glue has fallen off on its own.  Do not apply liquid, cream, or ointment medicine to the wound while the skin glue is in place. Using those may loosen the film before the wound has healed.  If you were given a bandage (dressing), you should change it at least one time per day or as told by your health care provider. You should also change it if it becomes dirty or wet.  If a dressing is placed over the wound, be careful not to apply tape directly over the skin glue. Doing that may cause the glue to be pulled off before the wound has healed.  Do not pick at the glue. The skin glue usually remains in place for 5-10 days, then it falls off of the skin. General Instructions  Take over-the-counter and  prescription medicines only as told by your health care provider.  If you were prescribed an antibiotic medicine or ointment, take or apply it as told by your doctor. Do not stop using it even if your condition improves.  To help prevent scarring, make sure to cover your wound with sunscreen whenever you are outside after stitches are removed, after adhesive strips are removed, or when glue remains in place and the wound is healed. Make sure to wear a sunscreen of at least 30 SPF.  Do not scratch or pick at the wound.  Keep all follow-up visits as told by your health care provider. This is important.  Check your wound every day for signs of infection. Watch for:  Redness, swelling, or pain.  Fluid, blood, or pus.  Raise (elevate) the injured area above the level of your heart while you are sitting or lying down, if possible. SEEK MEDICAL CARE IF:  You received a tetanus shot and you have swelling, severe pain, redness, or bleeding at the injection site.  You have a fever.  A wound that was closed breaks open.  You notice a bad smell coming from your wound or your dressing.  You notice something coming out of the wound, such as wood or glass.  Your pain is not controlled with medicine.  You have increased redness, swelling, or pain at the site of your wound.  You have fluid, blood, or pus coming from your wound.  You notice a change in the color of your skin near your wound.  You need to change the dressing frequently due to fluid, blood, or pus draining from the wound.  You develop a new rash.  You develop numbness around the wound. SEEK IMMEDIATE MEDICAL CARE IF:  You develop severe swelling around the wound.  Your pain suddenly increases and is severe.  You develop painful lumps near the wound or on skin that is anywhere on your body.  You have a red streak going away from your wound.  The wound is on your hand or foot and you cannot properly move a finger or  toe.  The wound is on your hand or foot and you notice that your fingers or toes look pale or bluish.   This information is not intended to replace advice given to you by your health care provider. Make sure you discuss any questions you have with your health care provider.   Document Released: 02/02/2005 Document Revised: 06/19/2014 Document Reviewed: 01/29/2014 Elsevier Interactive Patient Education 2016 [place], [place], or Adhesive Wound Closure Health care providers use stitches (sutures), staples, and certain glue (skin adhesives) to hold skin together while it heals (wound closure). You may need this treatment after you have surgery or if you cut your skin accidentally. These methods help your skin to heal more quickly and make it less likely that you will have a scar. A wound may take several months to heal completely. The type of wound you have determines when your wound gets closed. In most cases, the wound is closed as soon as possible (primary skin closure). Sometimes, closure is delayed so the wound can be cleaned and allowed to heal naturally. This reduces the chance of infection. Delayed closure may be needed if your wound:  Is caused by a bite.  Happened more than 6 hours ago.  Involves loss of skin or the tissues under the skin.  Has dirt or debris in it that cannot be removed.  Is infected. WHAT ARE THE DIFFERENT KINDS OF WOUND CLOSURES? There are many options for wound closure. The one that your health care provider uses depends on how deep and how large your wound is. Adhesive Glue To use this type of glue to close a wound, your health care provider holds the edges of the wound together and paints the glue on the surface of your skin. You may need more than one layer of glue. Then the wound may be covered with a light bandage (dressing). This type of skin closure may be used for small wounds that are not deep (superficial). Using glue for wound closure  is less painful than other  methods. It does not require a medicine that numbs the area (local anesthetic). This method also leaves nothing to be removed. Adhesive glue is often used for children and on facial wounds. Adhesive glue cannot be used for wounds that are deep, uneven, or bleeding. It is not used inside of a wound.  Adhesive Strips These strips are made of sticky (adhesive), porous paper. They are applied across your skin edges like a regular adhesive bandage. You leave them on until they fall off. Adhesive strips may be used to close very superficial wounds. They may also be used along with sutures to improve the closure of your skin edges.  Sutures Sutures are the oldest method of wound closure. Sutures can be made from natural substances, such as silk, or from synthetic materials, such as nylon and steel. They can be made from a material that your body can break down as your wound heals (absorbable), or they can be made from a material that needs to be removed from your skin (nonabsorbable). They come in many different strengths and sizes. Your health care provider attaches the sutures to a steel needle on one end. Sutures can be passed through your skin, or through the tissues beneath your skin. Then they are tied and cut. Your skin edges may be closed in one continuous stitch or in separate stitches. Sutures are strong and can be used for all kinds of wounds. Absorbable sutures may be used to close tissues under the skin. The disadvantage of sutures is that they may cause skin reactions that lead to infection. Nonabsorbable sutures need to be removed. Staples When surgical staples are used to close a wound, the edges of your skin on both sides of the wound are brought close together. A staple is placed across the wound, and an instrument secures the edges together. Staples are often used to close surgical cuts (incisions). Staples are faster to use than sutures, and they cause less skin  reaction. Staples need to be removed using a tool that bends the staples away from your skin. HOW DO I CARE FOR MY WOUND CLOSURE?  Take medicines only as directed by your health care provider.  If you were prescribed an antibiotic medicine for your wound, finish it all even if you start to feel better.  Use ointments or creams only as directed by your health care provider.  Wash your hands with soap and water before and after touching your wound.  Do not soak your wound in water. Do not take baths, swim, or use a hot tub until your health care provider approves.  Ask your health care provider when you can start showering. Cover your wound if directed by your health care provider.  Do not take out your own sutures or staples.  Do not pick at your wound. Picking can cause an infection.  Keep all follow-up visits as directed by your health care provider. This is important. HOW LONG WILL I HAVE MY WOUND CLOSURE?  Leave adhesive glue on your skin until the glue peels away.  Leave adhesive strips on your skin until the strips fall off.  Absorbable sutures will dissolve within several days.  Nonabsorbable sutures and staples must be removed. The location of the wound will determine how long they stay in. This can range from several days to a couple of weeks. WHEN SHOULD I SEEK HELP FOR MY WOUND CLOSURE? Contact your health care provider if:  You have a fever.  You have chills.  You have  drainage, redness, swelling, or pain at your wound.  There is a bad smell coming from your wound.  The skin edges of your wound start to separate after your sutures have been removed.  Your wound becomes thick, raised, and darker in color after your sutures come out (scarring).   This information is not intended to replace advice given to you by your health care provider. Make sure you discuss any questions you have with your health care provider.   Document Released: 10/28/2000 Document  Revised: 02/23/2014 Document Reviewed: 07/12/2013 Elsevier Interactive Patient Education [employer].

## 2015-03-23 NOTE — ED Notes (Signed)
Pt was sleeping and rolled out of bed, lac noted to left side of head.

## 2015-04-10 ENCOUNTER — Ambulatory Visit: Payer: Medicare PPO | Admitting: Neurology

## 2015-04-25 ENCOUNTER — Encounter: Payer: Self-pay | Admitting: Neurology

## 2015-04-25 ENCOUNTER — Ambulatory Visit (INDEPENDENT_AMBULATORY_CARE_PROVIDER_SITE_OTHER): Payer: Medicare PPO | Admitting: Neurology

## 2015-04-25 VITALS — BP 110/65 | HR 46 | Ht 62.0 in | Wt 185.0 lb

## 2015-04-25 DIAGNOSIS — I6312 Cerebral infarction due to embolism of basilar artery: Secondary | ICD-10-CM

## 2015-04-25 DIAGNOSIS — E785 Hyperlipidemia, unspecified: Secondary | ICD-10-CM

## 2015-04-25 DIAGNOSIS — J439 Emphysema, unspecified: Secondary | ICD-10-CM

## 2015-04-25 DIAGNOSIS — I63411 Cerebral infarction due to embolism of right middle cerebral artery: Secondary | ICD-10-CM | POA: Diagnosis not present

## 2015-04-25 DIAGNOSIS — I10 Essential (primary) hypertension: Secondary | ICD-10-CM | POA: Diagnosis not present

## 2015-04-25 DIAGNOSIS — I480 Paroxysmal atrial fibrillation: Secondary | ICD-10-CM

## 2015-04-25 NOTE — Progress Notes (Signed)
STROKE NEUROLOGY FOLLOW UP NOTE  NAME: Summer Bradley DOB: 04-14-1945  REASON FOR VISIT: stroke follow up HISTORY FROM: chart and daughter  Today we had the pleasure of seeing Summer Bradley in follow-up at our Neurology Clinic. Pt was accompanied by daughter.   History Summary Summer Bradley is a 70 y.o. female with history of hypertension, CVA, A. fib and COPD was admitted on 01/29/2014 for left-sided weakness. In the ER, a CT was obtained which shows evidence of a previous stroke, and a CT perfusion scan was obtained which shows a significant mismatch demonstrating an area of penumbra, though there is an smaller area of likely infarct core as well. Patient was not administered TPA secondary to being outside of the tPA window but she was taken to neuro intervention where she had complete revascularization of RT MCA M1 with TICI 3 flow reesablished. Afterwards, she was admitted to the neuro ICU. MRI showed moderate size right MCA infarct therefore her anticoagulation was on hold to avoid hemorrhagic transformation. 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 IR consulted again for mechanical thrombectomy and was able to extract the clot, and the patient has sustained a right pontine and midbrain stroke, small bilateral cerebellar strokes, and right PCA infarcts. She was intubated for the procedure and had prolonged ventilation due to difficulty off vent. Her neuro status improved quickly and eliquis was started. After extubation, she was transferred to floor. However, she still has respiratory distress and had aspiration pneumonia and again was intubated and transferred to ICU. Trach and PEG performed 02/15/14. She was stabilized and neuro stable and off the vent. CCM following pt with trach and eventually she was able to tolerate with trach collar and capping, she was off trach one week prior to discharge. Unfortunately, she developed melena and eliquis  stopped. 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 She was s/p APC. She did not need blood transfusion and H&H stable throughout. As per GI, her eliquis can be cautiously resumed. She was put on eliquis 2.5mg  bid for two days and then started full dose eliquis 5mg  bid on 03/07/14. She also developed delirium and sun downing, she was put on seroquel Qhs and Xananx tid with versed PRN. On discharge, she was continued on 12.5mg  Qhs seroquel, but Xanax and versed discontinued and resumed her home medication of clonazepam 0.5mg  PRN . Her respiratory issue much better, on nebulizations. CXR no consolidations. She was transferred to SNF in stable condition.   05/09/14 follow-up - the patient had dramatic improvement. She is awake alert, quite talkative, in good spirit, still on nasal cannular with coughing, but neurologically she was intact except left hemianopia. She is ready to go home this Friday from nursing home. She is able to walk with walker independently. Her G-tube was put on by her one week ago, currently the wound heals well. She is able to eat without difficulty. She still on Eliquis for stroke prevention. Also on seroquel 12.5 at night with clonazepam when necessary for anxiety. Blood pressure in clinic today 117/66.  10/08/14 follow up - patient has been doing well, able to walk without any walker or cane this time. She has been discharged to home from SNF. Continued on eliquis 5mg  bid without difficulties. Visual field deficit on the left since has significant improvement.  Interval History During the interval time, pt has been doing well from stroke standpoint. No recurrent stroke like symptoms. However, she has had 2  falls since last visit. One time her left leg buckled and she fell face down and still has scar at forehead. The other one was on 03/23/15, she fell out of bed during sleep and needed stitches at vertex of her head. CT head and C-spine negative. She is on  eliquis 5mg  bid, compliant. She still on home O2, 2L/min. Complains of cough and sputum. BP stable at home, today 110/65.   REVIEW OF SYSTEMS: Full 14 system review of systems performed and notable only for those listed below and in HPI above, all others are negative:  Constitutional:  Cardiovascular:  Ear/Nose/Throat:  Runny nose Skin:  Eyes:   Respiratory:  wheezing Gastroitestinal:   Genitourinary:  Hematology/Lymphatic:   Endocrine: excessive thirst Musculoskeletal:   Allergy/Immunology:   Neurological:   Psychiatric: agitation, confusion Sleep:   The following represents the patient's updated allergies and side effects list: Allergies  Allergen Reactions  . Lisinopril     Unknown reaction    The neurologically relevant items on the patient's problem list were reviewed on today's visit.  Neurologic Examination  A problem focused neurological exam (12 or more points of the single system neurologic examination, vital signs counts as 1 point, cranial nerves count for 8 points) was performed.  Blood pressure 110/65, pulse 46, height 5\' 2"  (1.575 m), weight 185 lb (83.915 kg).  General - Well nourished, well developed, in mild respiratory distress.  Ophthalmologic - fundi not visualized due to frequent eye movement.  Cardiovascular - Regular rate and rhythm, no irregular rhythm.  Mental Status -  Level of arousal and orientation to time, place, and person were intact. Language including expression, naming, repetition, comprehension was assessed and found intact. Fund of Knowledge was assessed and was intact.  Cranial Nerves II - XII - II - Visual field testing showed patchy left lower quadrant visual field deficit. III, IV, VI - Extraocular movements intact. V - Facial sensation intact bilaterally. VII - Facial movement testing showed mild left facial droop. VIII - Hearing & vestibular intact bilaterally. X - Palate elevates symmetrically. XI - Chin turning &  shoulder shrug intact bilaterally. XII - Tongue protrusion intact.  Motor Strength - The patient's strength was normal in all extremities and pronator drift was absent except mild left hand dexterity difficulty.  Bulk was normal and fasciculations were absent.   Motor Tone - Muscle tone was assessed at the neck and appendages and was normal.  Reflexes - The patient's reflexes were normal in all extremities and she had no pathological reflexes.  Sensory - Light touch, temperature/pinprick were assessed and were normal.    Coordination - The patient had normal movements in the hands with no ataxia or dysmetria.  Tremor was absent.  Gait and Station - slow, stooped posturing, walk without cane or walker.  Data reviewed: I personally reviewed the images and agree with the radiology interpretations.  Ct Head Wo Contrast  02/01/2014 Stat CT scan shows no acute changes in the brain, but there appears to be a thrombus in the basilar artery that is new.  01/30/2014 1. Resolved right MCA territory hyperdensity present at 1056 hrs yesterday, most likely was post Neurointervention parenchymal contrast staining. 2. Mild cytotoxic edema suspected in that same right MCA territory. Underlying chronic ischemic changes. 3. Stable parafalcine subdural hematoma. No new intracranial hemorrhage or acute mass effect. 4. No new intracranial abnormality.  01/29/2014 Acute infarct right MCA territory as noted on CT perfusion study. There is interval development of high density  in the right temporoparietal cortex which may be due to contrast material or hemorrhage. Continued follow-up recommended. Small interhemispheric subdural hematoma is unchanged.  01/29/2014 1. 4 mm thick posterior falx subdural hemorrhage. 2. Prominent density at the right M1/M2 junction. CTA is already planned. 3. Established infarcts in the anterior and posterior right MCA territory. No definitive acute infarct. 4. Left frontal  scalp contusion. No acute fracture.   Ct Cerebral Perfusion W/cm 01/29/2014 1. Occlusive thrombus at the right M1-M2 junction. Subjectively good pial/pial collateral circulation. 2. Perfusion findings suggest moderate acute infarct in the central right MCA territory with rim of penumbra, as above. 3. Remote infarcts at the anterior and posterior margins of the right MCA territory. 4. Thin posterior falx subdural hemorrhage.   Cerebral Angiogram  01/29/2014 S/P Bilateral common carotid arteriograms, followed by complete revascularization of RT MCA M1 occlusion usingX2 passes with the Solitaire FR 6mm x 75mm retrieval device And 10 mg of superselective intracranial IA Integrelin. TICI 3 flow reesablished.   MRI brain  02/02/14 1. New, acute infarcts in the posterior circulation related to treated basilar thrombosis. Infarcts present in the bilateral cerebellum, upper right brainstem, mesial right temporal lobe, and minimally along the right occipital cortex. Normal flow voids within the intracranial vessels. 2. Stable appearance of recent right MCA territory infarct. 3. Stable trace falcine subdural hemorrhage.  01/31/14 1. Acute nonhemorrhagic infarct involving the superior right temporal lobe, right insular ribbon, right frontal operculum, and posterior right frontal lobe. 2. More remote infarcts are again noted in the more anterior inferior right frontal lobe and the right parietal lobe. 3. Extensive white matter changes bilaterally likely reflect the sequela of chronic microvascular ischemia, advanced for age. 4. White matter changes extend into the brainstem. 5. Remote tender infarcts of the cerebellum bilaterally, worse on the right.  Dg Chest Port 1 View 02/08/2014 1. Right PICC line stable position. 2. Congestive heart failure with interstitial edema and small right pleural effusion. Mild improvement from prior exam. 02/10/2014 Improved bibasilar atelectasis versus  airspace disease. 02/13/2014 - No pneumothorax status post bilateral chest tube placement. Stable support apparatus 02/19/2014- Resolved left basilar pneumothorax. Bilateral hazy airspace disease not significantly changed 03/05/2014 - Findings felt to represent a degree of congestive heart failure superimposed on appearing emphysematous change. No airspace consolidation. No pneumothorax.  2D Echocardiogram EF 50-55% with no source of embolus.   Small bowel enteroscopy -  1. jejunal AVM 2. Jejunal ulcer 3. Nonbleeding gastric polyps  Colonoscopy -  1. Mild diverticulosis was noted in the sigmoid colon 2. Internal hemorrhoids 3. nonbleeding: Polyp 4. The examination was otherwise normal no obvious colonic bleeding source  Endoscopy - Normal appearing esophagus and GE junction, the stomach was well visualized and normal in appearance, normal appearing duodenum  CT head and C-spine 03/23/15 1. No evidence of acute intracranial or cervical spine injury. 2. Left parietal scalp laceration without fracture. 3. Remote right MCA and posterior circulation infarcts, as above.  Component     Latest Ref Rng 01/30/2014  Cholesterol     0 - 200 mg/dL 114  Triglycerides     <150 mg/dL 102  HDL Cholesterol     >39 mg/dL 24 (L)  Total CHOL/HDL Ratio      4.8  VLDL     0 - 40 mg/dL 20  LDL (calc)     0 - 99 mg/dL 70  Hemoglobin A1C     <5.7 % 6.4 (H)  Mean Plasma Glucose     <  117 mg/dL 137 (H)    Assessment: As you may recall, she is a 70 y.o. Caucasian female with PMH of hypertension, CVA, A. fib and COPD was admitted on 01/29/2014 for right MCA stroke. She did not receive TPA due to on window but received mechanical thrombectomy with complete re-vascularization. 2 days later, she developed basilar artery occlusion which required again mechanical thrombectomy. MRI showed right moderate right MCA stroke with posterior stroke including right PCA, right brainstem, and bilateral  cerebellum. She had complicated and extensive hospitalization with multiple combinations including aspiration pneumonia requiring intubation and tracheostomy, dysphagia requiring PEG tube placement, GI bleeding requiring endoscopy and colonoscopy and APC procedure. Eventually she was off trach, restarted Eliquis, and stabilize before discharge to SNF. She made a dramatic recovery in SNF and at home, and currently she only had patchy left lower quadrant visual field deficit and mild left hand dexterity difficulty. She is able to walk without walker or cane at this time. Continue Eliquis and statin for stroke prevention. She had 2 falls for the last 6 months, recommend to walk with cane.  Plan:  - Continue Eliquis 5 mg bid and lovastatin for stroke prevention - check BP at home - Follow up with your primary care physician for stroke risk factor modification. Recommend maintain blood pressure goal <130/80, diabetes with hemoglobin A1c goal below 6.5% and lipids with LDL cholesterol goal below 70 mg/dL.  - recommend to walk with cane to avoid fall. - follow up in one year.  I spent more than 25 minutes of face to face time with the patient. Greater than 50% of time was spent in counseling and coordination of care. We discussed about management of cough and sputum, walk with cane to avoid fall, and follow up with PCP.   No orders of the defined types were placed in this encounter.    No orders of the defined types were placed in this encounter.    Patient Instructions  - Continue Eliquis 5 mg bid and lovastatin for stroke prevention - check BP at home - Follow up with your primary care physician for stroke risk factor modification. Recommend maintain blood pressure goal <130/80, diabetes with hemoglobin A1c goal below 6.5% and lipids with LDL cholesterol goal below 70 mg/dL.  - recommend to walk with 4-leg cane outside and regular cane at home. Avoid fall. - follow up in one year.    Rosalin Hawking, MD PhD [facility] [address] [place], [place] [address] [phone]

## 2015-04-25 NOTE — Patient Instructions (Signed)
-   Continue Eliquis 5 mg bid and lovastatin for stroke prevention - check BP at home - Follow up with your primary care physician for stroke risk factor modification. Recommend maintain blood pressure goal <130/80, diabetes with hemoglobin A1c goal below 6.5% and lipids with LDL cholesterol goal below 70 mg/dL.  - recommend to walk with 4-leg cane outside and regular cane at home. Avoid fall. - follow up in one year.

## 2015-07-12 ENCOUNTER — Emergency Department: Payer: Medicare PPO

## 2015-07-12 ENCOUNTER — Encounter: Payer: Self-pay | Admitting: Emergency Medicine

## 2015-07-12 ENCOUNTER — Emergency Department
Admission: EM | Admit: 2015-07-12 | Discharge: 2015-07-12 | Disposition: A | Discharge status: Home / Self Care | Payer: Medicare PPO | Attending: Student | Admitting: Student

## 2015-07-12 DIAGNOSIS — S0001XA Abrasion of scalp, initial encounter: Secondary | ICD-10-CM | POA: Diagnosis not present

## 2015-07-12 DIAGNOSIS — Y929 Unspecified place or not applicable: Secondary | ICD-10-CM | POA: Insufficient documentation

## 2015-07-12 DIAGNOSIS — E782 Mixed hyperlipidemia: Secondary | ICD-10-CM | POA: Diagnosis not present

## 2015-07-12 DIAGNOSIS — Y999 Unspecified external cause status: Secondary | ICD-10-CM | POA: Insufficient documentation

## 2015-07-12 DIAGNOSIS — W1800XA Striking against unspecified object with subsequent fall, initial encounter: Secondary | ICD-10-CM | POA: Diagnosis not present

## 2015-07-12 DIAGNOSIS — Z87891 Personal history of nicotine dependence: Secondary | ICD-10-CM | POA: Diagnosis not present

## 2015-07-12 DIAGNOSIS — R42 Dizziness and giddiness: Secondary | ICD-10-CM | POA: Diagnosis not present

## 2015-07-12 DIAGNOSIS — Y939 Activity, unspecified: Secondary | ICD-10-CM | POA: Diagnosis not present

## 2015-07-12 DIAGNOSIS — Z8673 Personal history of transient ischemic attack (TIA), and cerebral infarction without residual deficits: Secondary | ICD-10-CM | POA: Diagnosis not present

## 2015-07-12 DIAGNOSIS — I10 Essential (primary) hypertension: Secondary | ICD-10-CM | POA: Insufficient documentation

## 2015-07-12 DIAGNOSIS — J441 Chronic obstructive pulmonary disease with (acute) exacerbation: Secondary | ICD-10-CM | POA: Insufficient documentation

## 2015-07-12 DIAGNOSIS — S098XXA Other specified injuries of head, initial encounter: Secondary | ICD-10-CM | POA: Diagnosis present

## 2015-07-12 DIAGNOSIS — J449 Chronic obstructive pulmonary disease, unspecified: Secondary | ICD-10-CM

## 2015-07-12 DIAGNOSIS — I480 Paroxysmal atrial fibrillation: Secondary | ICD-10-CM | POA: Diagnosis not present

## 2015-07-12 DIAGNOSIS — W19XXXA Unspecified fall, initial encounter: Secondary | ICD-10-CM

## 2015-07-12 LAB — COMPREHENSIVE METABOLIC PANEL
ALT: 18 U/L (ref 14–54)
AST: 25 U/L (ref 15–41)
Albumin: 3.9 g/dL (ref 3.5–5.0)
Alkaline Phosphatase: 74 U/L (ref 38–126)
Anion gap: 8 (ref 5–15)
BUN: 16 mg/dL (ref 6–20)
CO2: 26 mmol/L (ref 22–32)
Calcium: 8.7 mg/dL — ABNORMAL LOW (ref 8.9–10.3)
Chloride: 105 mmol/L (ref 101–111)
Creatinine, Ser: 0.84 mg/dL (ref 0.44–1.00)
GFR calc Af Amer: >60 mL/min (ref >60)
GFR calc non Af Amer: >60 mL/min (ref >60)
Glucose, Bld: 96 mg/dL (ref 65–99)
Potassium: 3.8 mmol/L (ref 3.5–5.1)
Sodium: 139 mmol/L (ref 135–145)
Total Bilirubin: 0.9 mg/dL (ref 0.3–1.2)
Total Protein: 7.3 g/dL (ref 6.5–8.1)

## 2015-07-12 LAB — CBC WITH DIFFERENTIAL/PLATELET
Basophils Absolute: 0.1 10*3/uL (ref 0–0.1)
Basophils Relative: 1 %
Eosinophils Absolute: 0.2 10*3/uL (ref 0–0.7)
Eosinophils Relative: 1 %
HCT: 44.1 % (ref 35.0–47.0)
Hemoglobin: 14.2 g/dL (ref 12.0–16.0)
Lymphocytes Relative: 17 %
Lymphs Abs: 2.4 10*3/uL (ref 1.0–3.6)
MCH: 29.2 pg (ref 26.0–34.0)
MCHC: 32.2 g/dL (ref 32.0–36.0)
MCV: 90.6 fL (ref 80.0–100.0)
Monocytes Absolute: 1 10*3/uL — ABNORMAL HIGH (ref 0.2–0.9)
Monocytes Relative: 8 %
Neutro Abs: 9.9 10*3/uL — ABNORMAL HIGH (ref 1.4–6.5)
Neutrophils Relative %: 73 %
Platelets: 245 10*3/uL (ref 150–440)
RBC: 4.87 MIL/uL (ref 3.80–5.20)
RDW: 15.2 % — ABNORMAL HIGH (ref 11.5–14.5)
WBC: 13.6 10*3/uL — ABNORMAL HIGH (ref 3.6–11.0)

## 2015-07-12 LAB — URINALYSIS COMPLETE WITH MICROSCOPIC (ARMC ONLY)
Bilirubin Urine: NEGATIVE
Glucose, UA: NEGATIVE mg/dL
Ketones, ur: NEGATIVE mg/dL
Leukocytes, UA: NEGATIVE
Nitrite: NEGATIVE
Protein, ur: NEGATIVE mg/dL
Specific Gravity, Urine: 1.011 (ref 1.005–1.030)
pH: 5 (ref 5.0–8.0)

## 2015-07-12 NOTE — ED Provider Notes (Signed)
[facility] Emergency Department Provider Note  ____________________________________________  Time seen: Approximately 7:02 PM  I have reviewed the triage vital signs and the nursing notes.   HISTORY  Chief Complaint No chief complaint on file.    HPI Summer Bradley is a 70 y.o. female who presents emergency department for complaint of fall. Patient presents to ER via EMS from home. Patient states that she has a history of vertigo and believes that she stood up too quickly, became slightly dizzy, and fell forward striking the left side of her head. Patient endorses bleeding from the top of her head status post fall. Patient did not lose consciousness at any time. She denies any headache, visual changes, neck pain, chest pain, extremity pain. Patient does have a history of vertigo and states that she does feel slightly dizzy when standing too quickly. She denies any dizziness at this time. Patient does endorse shortness of breath but states that "this is no change from my normal COPD." Patient does endorse polyuria but states that she is on Lasix. She denies any flank pain, dysuria, hematuria.    Past Medical History  Diagnosis Date  . Hypertension   . Stroke ([place])   . A-fib ([place])   . COPD (chronic obstructive pulmonary disease) ([place])   . High cholesterol   . Heart disease   . Anxiety     Patient Active Problem List   Diagnosis Date Noted  . Pulmonary emphysema ([place]) 04/25/2015  . Emphysema of lung ([place]) 05/09/2014  . 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 Hyperlipidemia 05/09/2014  . GI bleed   . Benign neoplasm of colon 03/03/2014  . Diverticulosis of colon without hemorrhage 03/03/2014  . 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 Jejunal ulcer 03/03/2014  . Gastric polyps 03/03/2014  . Acute posthemorrhagic anemia  03/02/2014  . Melena 02/27/2014  . LGI bleed   . Acute on chronic respiratory failure ([place]) 02/16/2014  . 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 Aspiration pneumonitis ([place])   . Spontaneous pneumothorax   . Thrush   . COPD exacerbation ([place])   . SOB (shortness of breath)   . HLD (hyperlipidemia)   . Essential hypertension   . Dysphagia, pharyngoesophageal phase   . Acute tracheobronchitis 02/04/2014  . Atelectasis of left lung 02/04/2014  . Wheezing   . Acute respiratory failure ([place])   . Paroxysmal atrial fibrillation (HCC)   . 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 Stroke ([place]) 01/29/2014  . Respiratory failure ([place]) 01/29/2014    Past Surgical History  Procedure Laterality Date  . Abdominal surgery    . Radiology with anesthesia N/A 01/29/2014    Procedure: RADIOLOGY WITH ANESTHESIA;  Surgeon: Luanne Bras, MD;  Location: [place];  Service: Radiology;  Laterality: N/A;  . Radiology with anesthesia N/A 02/01/2014    Procedure: RADIOLOGY WITH ANESTHESIA;  Surgeon: Rob Hickman, MD;  Location: [place] [facility];  Service: Radiology;  Laterality: N/A;  . Tracheostomy  02/15/14    feinstein  . Esophagogastroduodenoscopy N/A 03/01/2014    Procedure: ESOPHAGOGASTRODUODENOSCOPY (EGD);  Surgeon: Inda Castle, MD;  Location: [place];  Service: Endoscopy;  Laterality: N/A;  . Colonoscopy N/A 03/03/2014    Procedure: COLONOSCOPY;  Surgeon: Inda Castle, MD;  Location: [place];  Service: Endoscopy;  Laterality: N/A;  . Enteroscopy N/A 03/03/2014    Procedure: ENTEROSCOPY;  Surgeon: Inda Castle, MD;  Location: [place];  Service: Endoscopy;  Laterality: N/A;    Current Outpatient Rx  Name  Route  Sig  Dispense  Refill  . acetaminophen (TYLENOL) 325 MG tablet   Oral   Take 650 mg by mouth every 6 (six) hours as needed (TAKE 2 TABLETS EVERY 6 HOURS AS  NEEDED).         Marland Kitchen albuterol (PROVENTIL HFA;VENTOLIN HFA) 108 (90 BASE) MCG/ACT inhaler   Inhalation   Inhale 1 puff into the lungs every 6 (six) hours as needed for wheezing or shortness of breath.         Marland Kitchen apixaban (ELIQUIS) 5 MG TABS tablet   Oral   Take 1 tablet (5 mg total) by mouth 2 (two) times daily.   60 tablet   5   . bisoprolol (ZEBETA) 5 MG tablet   Oral   Take 0.5 tablets (2.5 mg total) by mouth 2 (two) times daily.   30 tablet   5   . budesonide-formoterol (SYMBICORT) 160-4.5 MCG/ACT inhaler   Inhalation   Inhale 2 puffs into the lungs 2 (two) times daily.         . Calcium Carbonate-Vitamin D 600-400 MG-UNIT per tablet   Oral   Take 1 tablet by mouth daily.         . citalopram (CELEXA) 40 MG tablet               . diltiazem (DILACOR XR) 240 MG 24 hr capsule               . fluticasone (FLONASE) 50 MCG/ACT nasal spray   Each Nare   Place 1 spray into both nostrils daily as needed for allergies.          . furosemide (LASIX) 20 MG tablet   Oral   Take 1 tablet (20 mg total) by mouth 2 (two) times daily.   30 tablet   5   . ipratropium (ATROVENT HFA) 17 MCG/ACT inhaler   Inhalation   Inhale 2 puffs into the lungs 4 (four) times daily.         Marland Kitchen levalbuterol (XOPENEX) 0.63 MG/3ML nebulizer solution   Nebulization   Take 3 mLs (0.63 mg total) by nebulization every 6 (six) hours as needed for wheezing or shortness of breath.   3 mL   12   . levothyroxine (SYNTHROID, LEVOTHROID) 150 MCG tablet   Oral   Take 150 mcg by mouth daily before breakfast.          . Multiple Vitamin (MULTIVITAMIN WITH MINERALS) TABS tablet   Oral   Take 1 tablet by mouth daily.         . potassium chloride SA (K-DUR,KLOR-CON) 20 MEQ tablet   Oral   Take 2 tablets (40 mEq total) by mouth 2 (two) times daily.   30 tablet   0     Allergies Lisinopril  Family History  Problem Relation Age of Onset  . Diabetes Mother     Social  History Social History  Substance Use Topics  . Smoking status: Former Research scientist (life sciences)  . Smokeless tobacco: None     Comment: QUIT SMOKING "13 YEARS AGO"  . Alcohol Use: No     Review of Systems  Constitutional: No fever/chills Eyes: No visual changes. . Cardiovascular: no chest pain. Respiratory: Chronic cough with no change  from baseline. Chronic SOB with no change from baseline. Gastrointestinal: No abdominal pain.  No nausea, no vomiting.  No diarrhea.  No constipation. Genitourinary: Positive for polyuria that is chronic, no change from baseline. Negative for dysuria. No hematuria.  Musculoskeletal: Negative for musculoskeletal pain. Skin: Patient endorses a "cut" to the top of her head. Neurological: Negative for headaches, focal weakness or numbness. 10-point ROS otherwise negative.  ____________________________________________   PHYSICAL EXAM:  VITAL SIGNS: ED Triage Vitals  Enc Vitals Group     BP 07/12/15 1751 163/70 mmHg     Pulse Rate 07/12/15 1751 68     Resp 07/12/15 1751 20     Temp 07/12/15 1751 98.7 F (37.1 C)     Temp Source 07/12/15 1751 Oral     SpO2 07/12/15 1751 95 %     Weight 07/12/15 1751 183 lb (83.008 kg)     Height 07/12/15 1751 5\' 2"  (1.575 m)     Head Cir --      Peak Flow --      Pain Score --      Pain Loc --      Pain Edu? --      Excl. in [place]? --      Constitutional: Alert and oriented. Well appearing and in no acute distress. Eyes: Conjunctivae are normal. PERRL. EOMI. Head: Superficial abrasion is noted to the left parietal region. No bleeding at this time. Patient is nontender to palpation of the bony landmarks of the skull. No battle signs. No raccoon eyes. No serosanguineous fluid drainage from the ears or nares. No signs of epistaxis. Neck: No stridor.  No cervical spine tenderness to palpation.  Cardiovascular: Normal rate, regular rhythm. Normal S1 and S2.  Good peripheral circulation. Respiratory: Normal respiratory effort  without tachypnea or retractions. Lungs with scattered mild expiratory wheezing.Kermit Balo air entry to the bases with no decreased or absent breath sounds. Gastrointestinal: Bowel sounds 4 quadrants. Soft and nontender to palpation. No guarding or rigidity. No palpable masses. No distention. No CVA tenderness. Musculoskeletal: Full range of motion to all extremities. No gross deformities appreciated. Neurologic:  Normal speech and language. No gross focal neurologic deficits are appreciated. Cranial nerves II through XII grossly intact. Mild Nystagmus is identified during exam. Skin:  Skin is warm, dry and intact. No rash noted. Superficial abrasion is identified to the scalp as described above. Psychiatric: Mood and affect are normal. Speech and behavior are normal. Patient exhibits appropriate insight and judgement.   ____________________________________________   LABS (all labs ordered are listed, but only abnormal results are displayed)  Labs Reviewed  COMPREHENSIVE METABOLIC PANEL - Abnormal; Notable for the following:    Calcium 8.7 (*)    All other components within normal limits  CBC WITH DIFFERENTIAL/PLATELET - Abnormal; Notable for the following:    WBC 13.6 (*)    RDW 15.2 (*)    Neutro Abs 9.9 (*)    Monocytes Absolute 1.0 (*)    All other components within normal limits  URINALYSIS COMPLETEWITH MICROSCOPIC ([facility] ONLY) - Abnormal; Notable for the following:    Color, Urine YELLOW (*)    APPearance CLEAR (*)    Hgb urine dipstick 1+ (*)    Bacteria, UA RARE (*)    Squamous Epithelial / LPF 0-5 (*)    All other components within normal limits   ____________________________________________  EKG   ____________________________________________  RADIOLOGY Diamantina Providence Cuthriell, personally viewed and evaluated these images (plain radiographs) as part  of my medical decision making, as well as reviewing the written report by the radiologist.  Dg Chest 2 View  07/12/2015   CLINICAL DATA:  Cough and shortness of breath for 1 year EXAM: CHEST  2 VIEW COMPARISON:  03/05/2014 FINDINGS: Cardiac shadow is stable. Mild chronic changes are again seen in the bases bilaterally. Degenerative changes of thoracic spine are seen. No focal confluent infiltrate is noted. No sizable effusion is seen. IMPRESSION: Mild bibasilar interstitial changes which appear chronic. No acute abnormality is noted. Electronically Signed   By: Inez Catalina M.D.   On: 07/12/2015 20:02   Ct Head Wo Contrast  07/12/2015  CLINICAL DATA:  Fall. Worsening vertigo. Laceration to back of head. EXAM: CT HEAD WITHOUT CONTRAST TECHNIQUE: Contiguous axial images were obtained from the base of the skull through the vertex without intravenous contrast. COMPARISON:  03/23/2015 FINDINGS: Sinuses/Soft tissues: Left parietal occipital scalp soft tissue swelling and gas, likely related to laceration. Minimal fluid in the sphenoid sinus. No skull fracture. Intracranial: Age advanced cerebral and cerebellar atrophy. Prior right MCA distribution infarct, with ex vacuo dilatation of the right lateral ventricle. Mild low density in the periventricular white matter likely related to small vessel disease. Remote cerebellar and pontine infarcts, as before. No mass lesion, hemorrhage, hydrocephalus, acute infarct, intra-axial, or extra-axial fluid collection. IMPRESSION: 1. Left parietal/occipital scalp soft tissue swelling without acute intracranial abnormality. 2. Right MCA and posterior distribution remote infarct, as before. 3.  Cerebral atrophy and small vessel ischemic change. Electronically Signed   By: Abigail Miyamoto M.D.   On: 07/12/2015 20:08    ____________________________________________    PROCEDURES  Procedure(s) performed:       Medications - No data to display   ____________________________________________   INITIAL IMPRESSION / ASSESSMENT AND PLAN / ED COURSE  Pertinent labs & imaging results that were  available during my care of the patient were reviewed by me and considered in my medical decision making (see chart for details).  Patient's diagnosis is consistent with fall from vertigo causing an abrasion to the skull. Patient was evaluated with CT, chest x-ray, EKG, and labs. These returned with reassuring results. Patient has a superficial abrasion to the scalp. Bleeding is controlled with direct pressure.Marland KitchenMarland KitchenPatient is advised of findings of results verbalizes understanding same. She'll be discharged home with ED precautions to return for any change or worsening of symptoms. She is to follow-up primary care as needed     ____________________________________________  FINAL CLINICAL IMPRESSION(S) / ED DIAGNOSES  Final diagnoses:  Fall, initial encounter  Scalp abrasion, initial encounter  Vertigo  Chronic obstructive pulmonary disease, unspecified COPD type ([place])      NEW MEDICATIONS STARTED DURING THIS VISIT:  New Prescriptions   No medications on file        This chart was dictated using voice recognition software/Dragon. Despite best efforts to proofread, errors can occur which can change the meaning. Any change was purely unintentional.    Charline Bills Cuthriell, PA-C 07/12/15 [id number]  Joanne Gavel, MD 07/13/15 [phone]

## 2015-07-12 NOTE — Discharge Instructions (Signed)
Facial/Head Laceration  A facial laceration is a cut on the face. These injuries can be painful and cause bleeding. Lacerations usually heal quickly, but they need special care to reduce scarring. DIAGNOSIS  Your health care provider will take a medical history, ask for details about how the injury occurred, and examine the wound to determine how deep the cut is. TREATMENT  Some facial lacerations may not require closure. Others may not be able to be closed because of an increased risk of infection. The risk of infection and the chance for successful closure will depend on various factors, including the amount of time since the injury occurred. The wound may be cleaned to help prevent infection. If closure is appropriate, pain medicines may be given if needed. Your health care provider will use stitches (sutures), wound glue (adhesive), or skin adhesive strips to repair the laceration. These tools bring the skin edges together to allow for faster healing and a better cosmetic outcome. If needed, you may also be given a tetanus shot. HOME CARE INSTRUCTIONS  Only take over-the-counter or prescription medicines as directed by your health care provider.  Follow your health care provider's instructions for wound care. These instructions will vary depending on the technique used for closing the wound. For Sutures:  Keep the wound clean and dry.   If you were given a bandage (dressing), you should change it at least once a day. Also change the dressing if it becomes wet or dirty, or as directed by your health care provider.   Wash the wound with soap and water 2 times a day. Rinse the wound off with water to remove all soap. Pat the wound dry with a clean towel.   After cleaning, apply a thin layer of the antibiotic ointment recommended by your health care provider. This will help prevent infection and keep the dressing from sticking.   You may shower as usual after the first 24 hours. Do not soak  the wound in water until the sutures are removed.   Get your sutures removed as directed by your health care provider. With facial lacerations, sutures should usually be taken out after 4-5 days to avoid stitch marks.   Wait a few days after your sutures are removed before applying any makeup. For Skin Adhesive Strips:  Keep the wound clean and dry.   Do not get the skin adhesive strips wet. You may bathe carefully, using caution to keep the wound dry.   If the wound gets wet, pat it dry with a clean towel.   Skin adhesive strips will fall off on their own. You may trim the strips as the wound heals. Do not remove skin adhesive strips that are still stuck to the wound. They will fall off in time.  For Wound Adhesive:  You may briefly wet your wound in the shower or bath. Do not soak or scrub the wound. Do not swim. Avoid periods of heavy sweating until the skin adhesive has fallen off on its own. After showering or bathing, gently pat the wound dry with a clean towel.   Do not apply liquid medicine, cream medicine, ointment medicine, or makeup to your wound while the skin adhesive is in place. This may loosen the film before your wound is healed.   If a dressing is placed over the wound, be careful not to apply tape directly over the skin adhesive. This may cause the adhesive to be pulled off before the wound is healed.   Avoid  prolonged exposure to sunlight or tanning lamps while the skin adhesive is in place.  The skin adhesive will usually remain in place for 5-10 days, then naturally fall off the skin. Do not pick at the adhesive film.  After Healing: Once the wound has healed, cover the wound with sunscreen during the day for 1 full year. This can help minimize scarring. Exposure to ultraviolet light in the first year will darken the scar. It can take 1-2 years for the scar to lose its redness and to heal completely.  SEEK MEDICAL CARE IF:  You have a fever. SEEK  IMMEDIATE MEDICAL CARE IF:  You have redness, pain, or swelling around the wound.   You see ayellowish-white fluid (pus) coming from the wound.    This information is not intended to replace advice given to you by your health care provider. Make sure you discuss any questions you have with your health care provider.   Document Released: 03/12/2004 Document Revised: 02/23/2014 Document Reviewed: 09/15/2012 Elsevier Interactive Patient Education 2016 [place] in the Home  Falls can cause injuries and can affect people from all age groups. There are many simple things that you can do to make your home safe and to help prevent falls. WHAT CAN I DO ON THE OUTSIDE OF MY HOME?  Regularly repair the edges of walkways and driveways and fix any cracks.  Remove high doorway thresholds.  Trim any shrubbery on the main path into your home.  Use bright outdoor lighting.  Clear walkways of debris and clutter, including tools and rocks.  Regularly check that handrails are securely fastened and in good repair. Both sides of any steps should have handrails.  Install guardrails along the edges of any raised decks or porches.  Have leaves, snow, and ice cleared regularly.  Use sand or salt on walkways during winter months.  In the garage, clean up any spills right away, including grease or oil spills. WHAT CAN I DO IN THE BATHROOM?  Use night lights.  Install grab bars by the toilet and in the tub and shower. Do not use towel bars as grab bars.  Use non-skid mats or decals on the floor of the tub or shower.  If you need to sit down while you are in the shower, use a plastic, non-slip stool.Marland Kitchen  Keep the floor dry. Immediately clean up any water that spills on the floor.  Remove soap buildup in the tub or shower on a regular basis.  Attach bath mats securely with double-sided non-slip rug tape.  Remove throw rugs and other tripping hazards from the floor. WHAT  CAN I DO IN THE BEDROOM?  Use night lights.  Make sure that a bedside light is easy to reach.  Do not use oversized bedding that drapes onto the floor.  Have a firm chair that has side arms to use for getting dressed.  Remove throw rugs and other tripping hazards from the floor. WHAT CAN I DO IN THE KITCHEN?   Clean up any spills right away.  Avoid walking on wet floors.  Place frequently used items in easy-to-reach places.  If you need to reach for something above you, use a sturdy step stool that has a grab bar.  Keep electrical cables out of the way.  Do not use floor polish or wax that makes floors slippery. If you have to use wax, make sure that it is non-skid floor wax.  Remove throw rugs and other tripping  hazards from the floor. WHAT CAN I DO IN THE STAIRWAYS?  Do not leave any items on the stairs.  Make sure that there are handrails on both sides of the stairs. Fix handrails that are broken or loose. Make sure that handrails are as long as the stairways.  Check any carpeting to make sure that it is firmly attached to the stairs. Fix any carpet that is loose or worn.  Avoid having throw rugs at the top or bottom of stairways, or secure the rugs with carpet tape to prevent them from moving.  Make sure that you have a light switch at the top of the stairs and the bottom of the stairs. If you do not have them, have them installed. WHAT ARE SOME OTHER FALL PREVENTION TIPS?  Wear closed-toe shoes that fit well and support your feet. Wear shoes that have rubber soles or low heels.  When you use a stepladder, make sure that it is completely opened and that the sides are firmly locked. Have someone hold the ladder while you are using it. Do not climb a closed stepladder.  Add color or contrast paint or tape to grab bars and handrails in your home. Place contrasting color strips on the first and last steps.  Use mobility aids as needed, such as canes, walkers, scooters,  and crutches.  Turn on lights if it is dark. Replace any light bulbs that burn out.  Set up furniture so that there are clear paths. Keep the furniture in the same spot.  Fix any uneven floor surfaces.  Choose a carpet design that does not hide the edge of steps of a stairway.  Be aware of any and all pets.  Review your medicines with your healthcare provider. Some medicines can cause dizziness or changes in blood pressure, which increase your risk of falling. Talk with your health care provider about other ways that you can decrease your risk of falls. This may include working with a physical therapist or trainer to improve your strength, balance, and endurance.   This information is not intended to replace advice given to you by your health care provider. Make sure you discuss any questions you have with your health care provider.   Document Released: 01/23/2002 Document Revised: 06/19/2014 Document Reviewed: 03/09/2014 Elsevier Interactive Patient Education [employer].

## 2015-07-12 NOTE — ED Notes (Signed)
Brought in ems via ems from home  Hit head   Laceration noted to back of head and superficial laceration noted to right hand

## 2015-07-12 NOTE — ED Notes (Signed)
Pt assisted up to restroom for urine sample. Pt states "i missed the hat." no urine collected at th is time.

## 2015-09-06 ENCOUNTER — Other Ambulatory Visit: Payer: Self-pay | Admitting: Family Medicine

## 2015-09-06 DIAGNOSIS — Z1231 Encounter for screening mammogram for malignant neoplasm of breast: Secondary | ICD-10-CM

## 2015-09-06 DIAGNOSIS — M8580 Other specified disorders of bone density and structure, unspecified site: Secondary | ICD-10-CM

## 2015-10-02 ENCOUNTER — Ambulatory Visit: Payer: Medicare PPO | Attending: Family Medicine

## 2015-10-02 ENCOUNTER — Other Ambulatory Visit: Payer: Medicare PPO

## 2015-11-27 ENCOUNTER — Ambulatory Visit
Admission: RE | Admit: 2015-11-27 | Discharge: 2015-11-27 | Disposition: A | Discharge status: Home / Self Care | Payer: Medicare PPO | Source: Ambulatory Visit | Attending: Family Medicine | Admitting: Family Medicine

## 2015-11-27 DIAGNOSIS — Z1231 Encounter for screening mammogram for malignant neoplasm of breast: Secondary | ICD-10-CM | POA: Insufficient documentation

## 2015-11-27 DIAGNOSIS — M8580 Other specified disorders of bone density and structure, unspecified site: Secondary | ICD-10-CM

## 2015-11-27 DIAGNOSIS — M85851 Other specified disorders of bone density and structure, right thigh: Secondary | ICD-10-CM | POA: Insufficient documentation

## 2015-12-23 ENCOUNTER — Ambulatory Visit
Admission: RE | Admit: 2015-12-23 | Discharge: 2015-12-23 | Disposition: A | Discharge status: Home / Self Care | Payer: Medicare PPO | Source: Ambulatory Visit | Attending: Internal Medicine | Admitting: Internal Medicine

## 2015-12-23 ENCOUNTER — Other Ambulatory Visit: Payer: Self-pay | Admitting: Internal Medicine

## 2015-12-23 DIAGNOSIS — R05 Cough: Secondary | ICD-10-CM | POA: Diagnosis present

## 2015-12-23 DIAGNOSIS — R0602 Shortness of breath: Secondary | ICD-10-CM | POA: Insufficient documentation

## 2015-12-23 DIAGNOSIS — R059 Cough, unspecified: Secondary | ICD-10-CM

## 2015-12-23 DIAGNOSIS — I517 Cardiomegaly: Secondary | ICD-10-CM | POA: Insufficient documentation

## 2016-02-09 IMAGING — CT CT HEAD W/O CM
2 series · 15 of 30 positions shown, 19 images · non-contrast
Comparison: CT 01/29/2014

CLINICAL DATA: Right MCA clot retrieval.  Stroke.

EXAM:
CT HEAD WITHOUT CONTRAST
TECHNIQUE: Contiguous axial images were obtained from the base of the skull
through the vertex without intravenous contrast.

[Series 201: head w/o, idose (1) · axial · non-contrast · 0.49mm/px · z∈[+60,+190]mm · 13 of 32 slices shown, 17 images]
[im 3/32  brain]
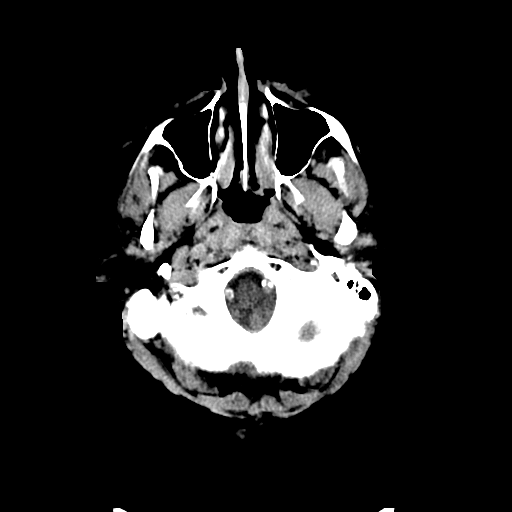
[im 3/32  bone]
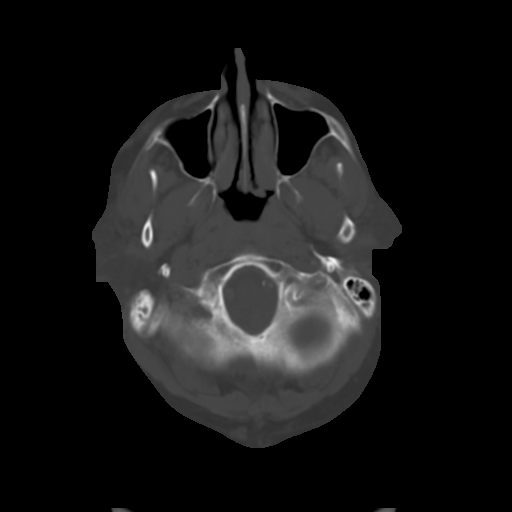
[im 5/32  brain]
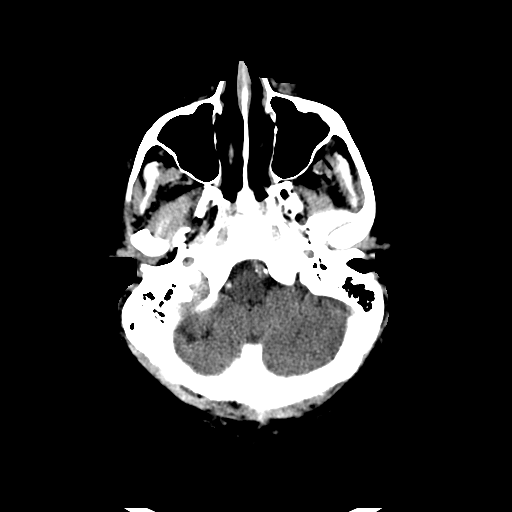
[im 7/32  brain]
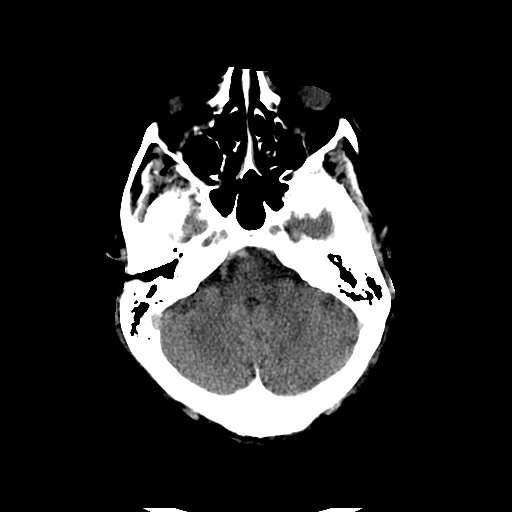
[im 9/32  brain]
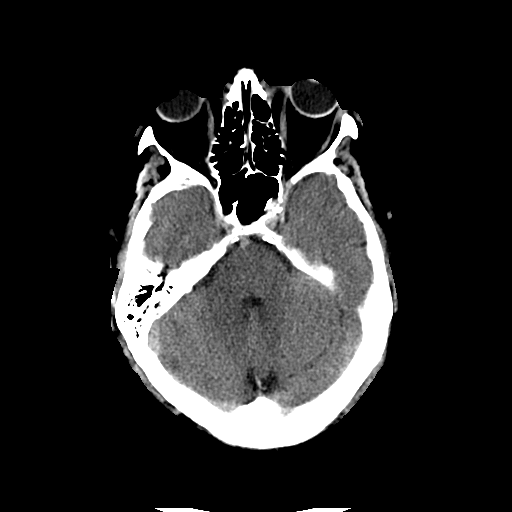
[im 12/32  brain]
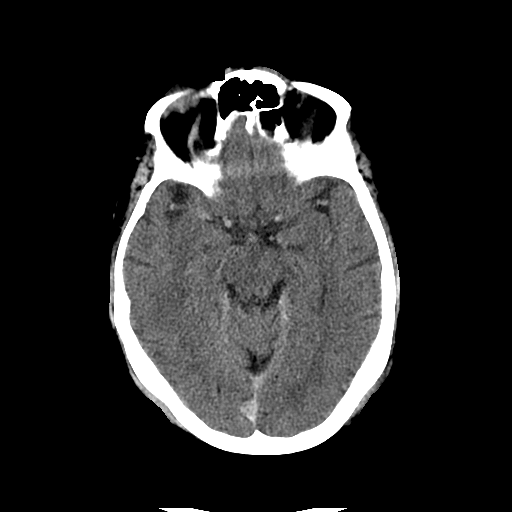
[im 12/32  bone]
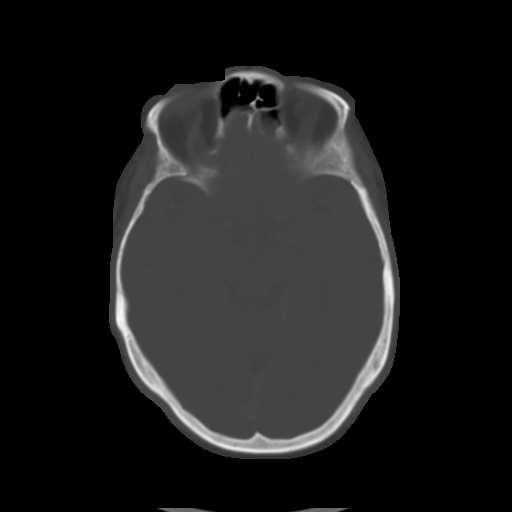
[im 14/32  brain]
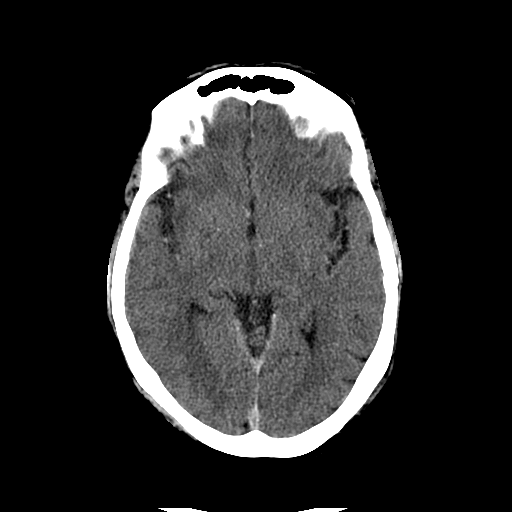
[im 16/32  brain]
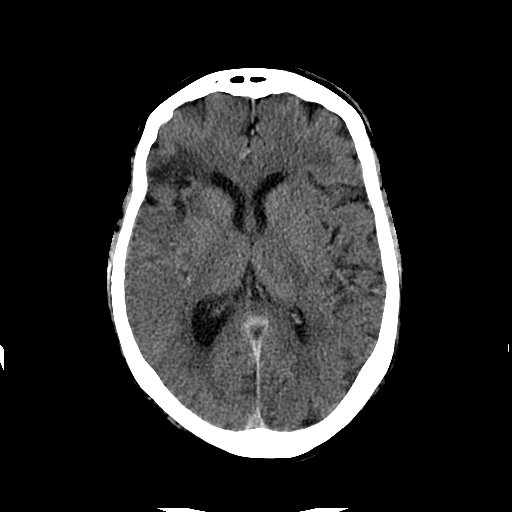
[im 18/32  brain]
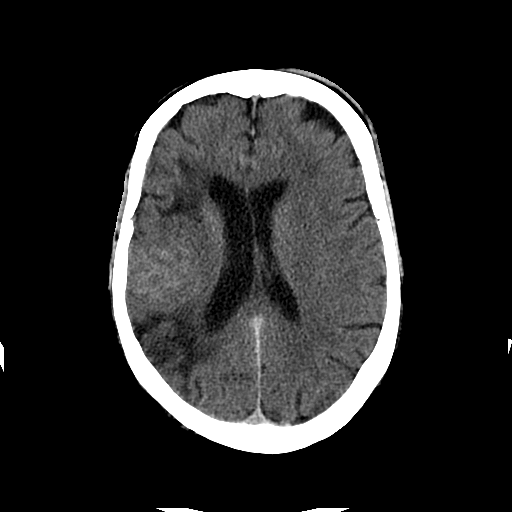
[im 20/32  brain]
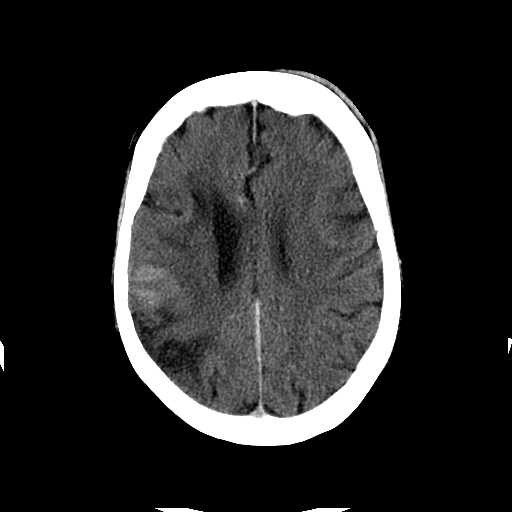
[im 20/32  bone]
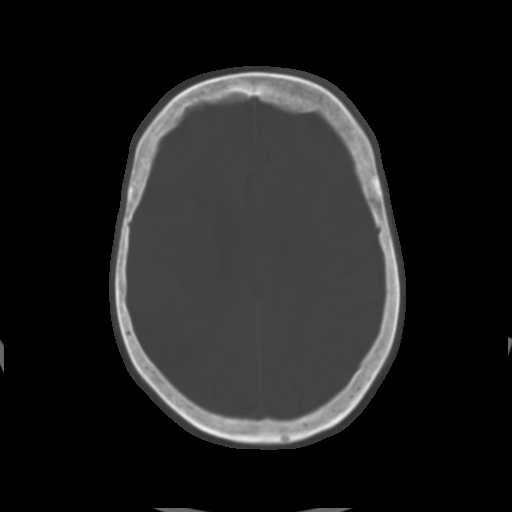
[im 23/32  brain]
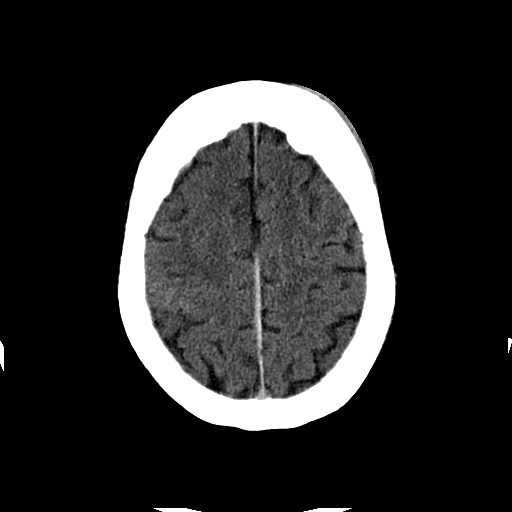
[im 25/32  brain]
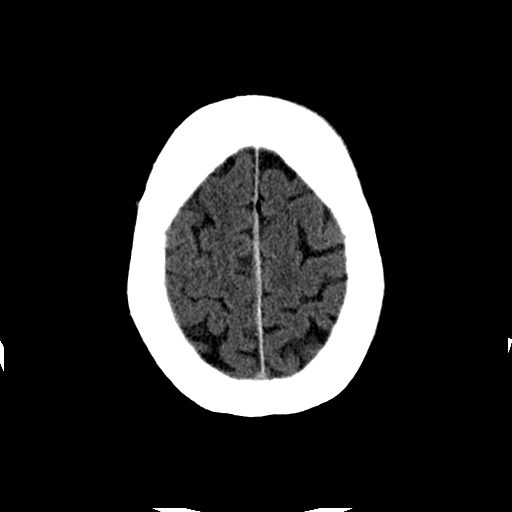
[im 27/32  brain]
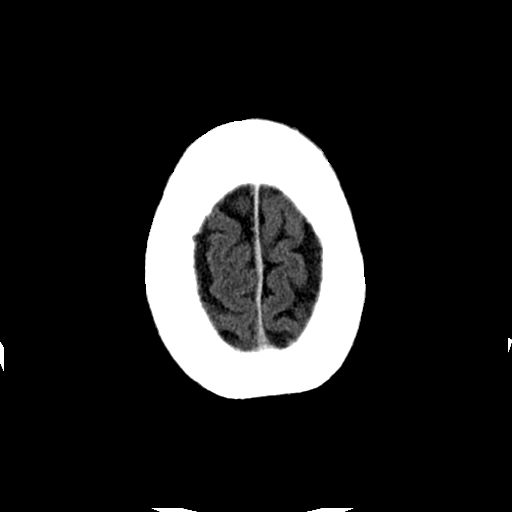
[im 29/32  brain]
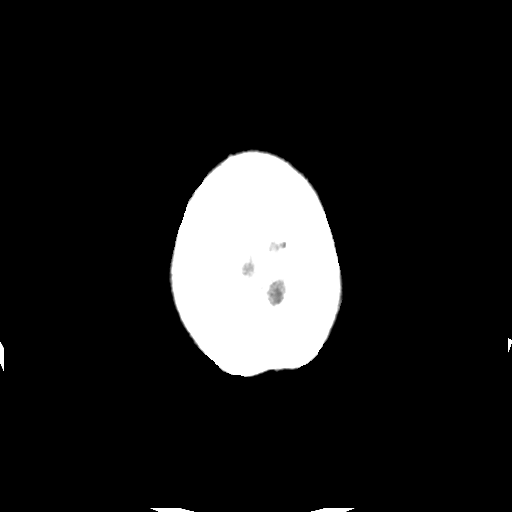
[im 29/32  bone]
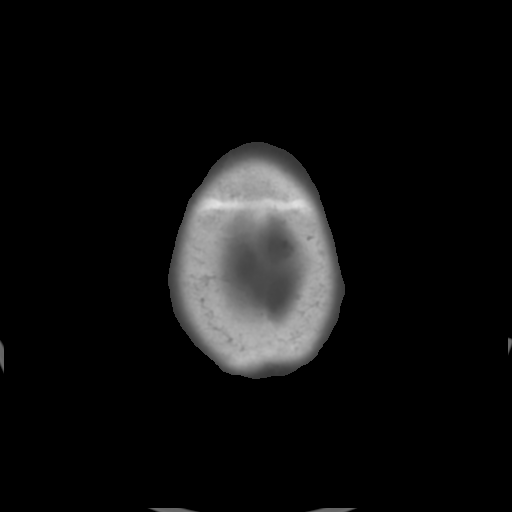

[Series 202: head w/o bone, idose (1) · axial · non-contrast · 0.49mm/px · z∈[+60,+80]mm · 2 of 32 slices shown]
[im 3/32  bone]
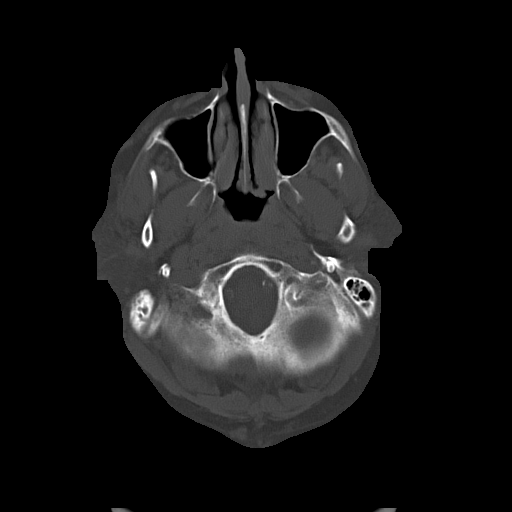
[im 7/32  bone]
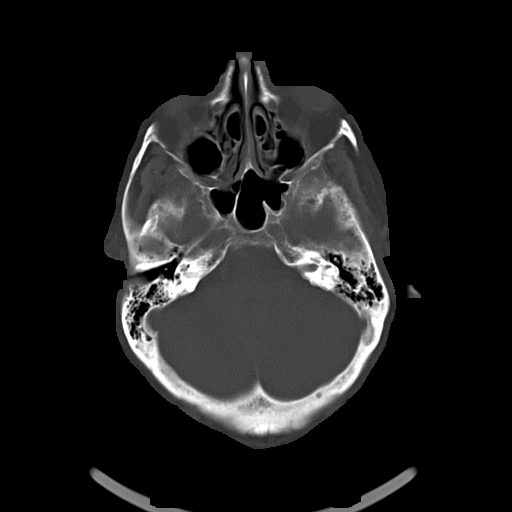

[15 of 30 positions shown; findings below may reference images not displayed]

FINDINGS: Small interhemispheric subdural hemorrhage unchanged.

Acute infarct in the right MCA territory. This study was done
following angiography and clot retrieval. There is vascular contrast
present. There is high density in the right temporoparietal cortex
which may be due to contrast staining from angiography versus
hemorrhage. Continued follow-up recommended. No significant mass
effect or midline shift. Chronic infarcts in the right frontal
temporal lobe and right parietal lobe as noted previously.

Ventricle size is normal. No shift of the midline structures.
Calvarium is intact.
IMPRESSION: Acute infarct right MCA territory as noted on CT perfusion study.
There is interval development of high density in the right
temporoparietal cortex which may be due to contrast material or
hemorrhage. Continued follow-up recommended.

Small interhemispheric subdural hematoma is unchanged.

## 2016-02-10 IMAGING — CR DG CHEST 1V PORT
1 series · 1 of 1 positions shown · non-contrast
Comparison: Portable chest x-ray January 29, 2014

CLINICAL DATA: Respiratory failure; history of COPD and atrial
fibrillation and previous CVA ; past year history of tobacco use

EXAM:
PORTABLE CHEST - 1 VIEW

[AP]
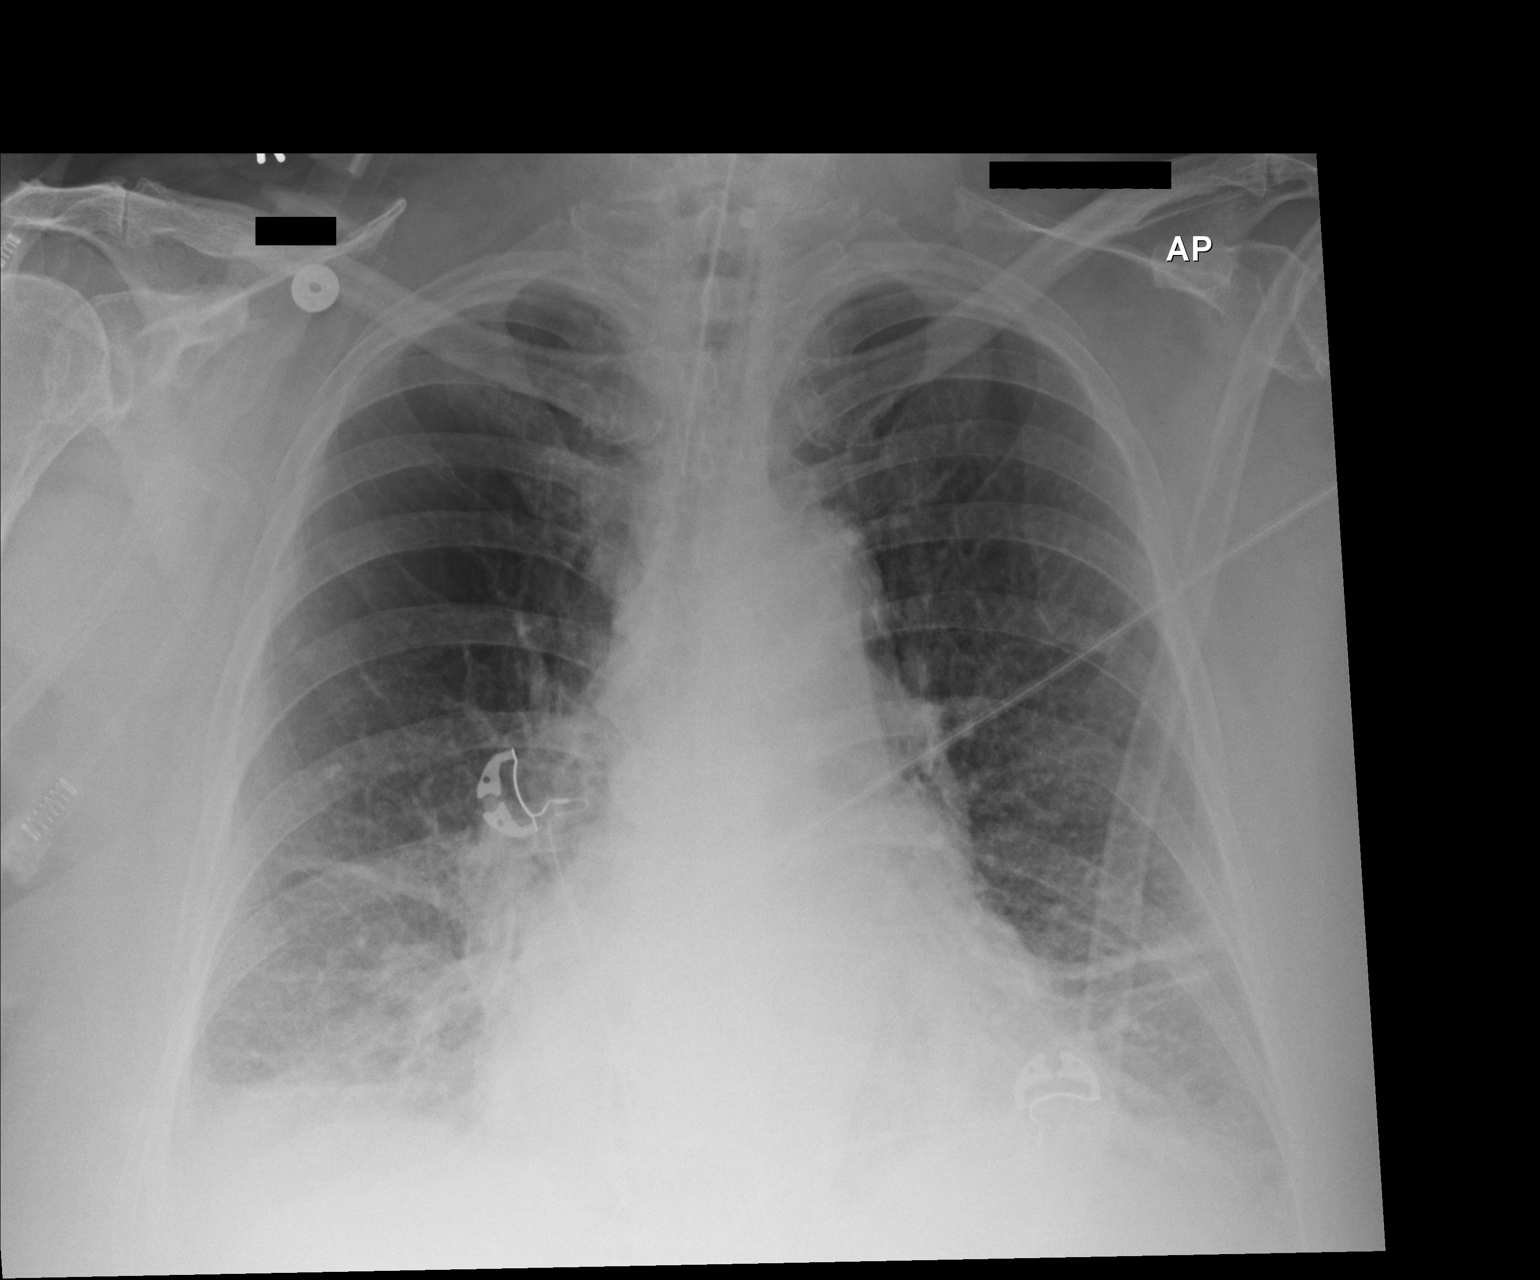

[1 of 1 positions shown; findings below may reference images not displayed]

FINDINGS: The endotracheal tube tip lies 5.2 cm above the crotch of the
carina. The lungs are well-expanded. There is bibasilar subsegmental
atelectasis which is more conspicuous today. The underline basilar
interstitial markings remain increased. The hemidiaphragms are
partially obscured today. There is no pneumothorax nor large pleural
effusion. The cardiac silhouette is mildly enlarged though stable.
The pulmonary vascularity is not engorged. The bony thorax exhibits
no acute abnormalities.
IMPRESSION: COPD with bibasilar scarring with superimposed subsegmental
atelectasis. There is no evidence of CHF.

## 2016-02-10 IMAGING — CT CT HEAD W/O CM
1 series · 15 of 30 positions shown, 19 images · non-contrast
Comparison: 01/29/2014 and earlier.

CLINICAL DATA: 68-year-old female status post right MCA infarct and
intra-arterial intervention. Initial encounter.

EXAM:
CT HEAD WITHOUT CONTRAST
TECHNIQUE: Contiguous axial images were obtained from the base of the skull
through the vertex without intravenous contrast.

[Series 2: head 5.0 h30s · axial · 0.51mm/px · z∈[-124,+16]mm · 15 of 32 slices shown, 19 images]
[im 2/32  brain]
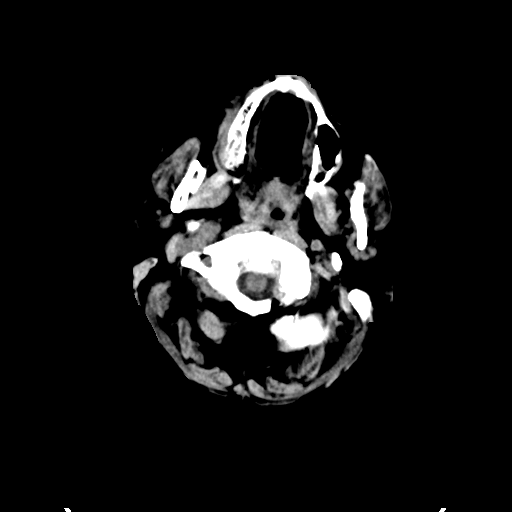
[im 2/32  bone]
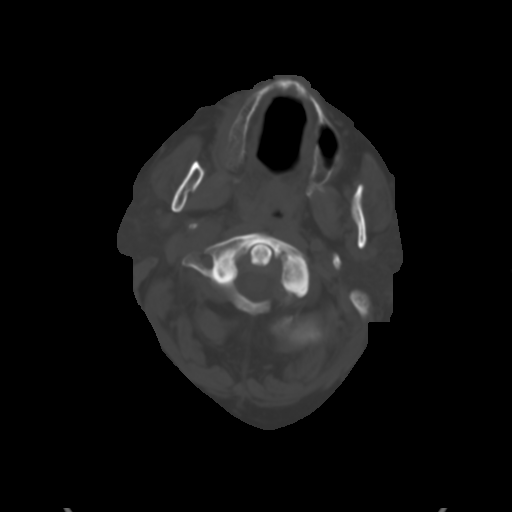
[im 4/32  brain]
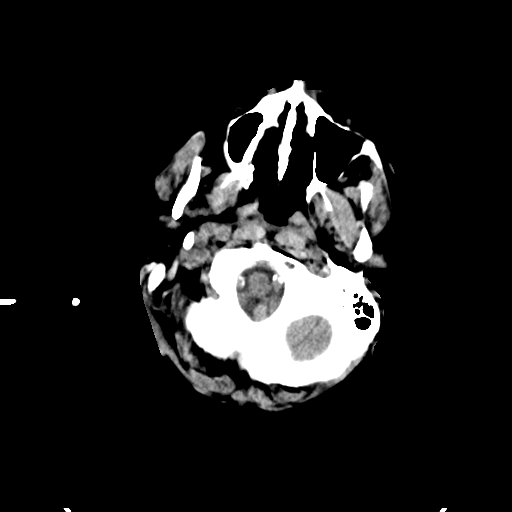
[im 6/32  brain]
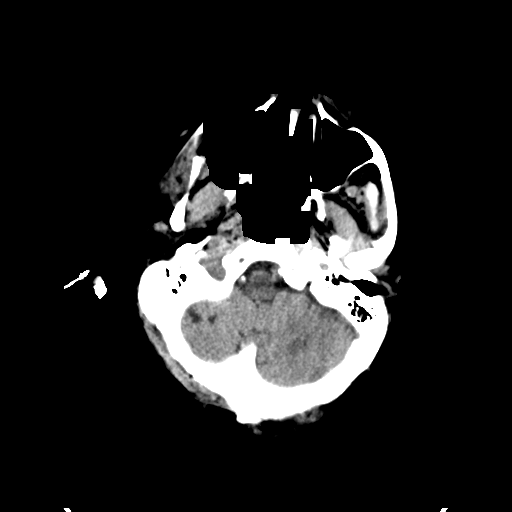
[im 8/32  brain]
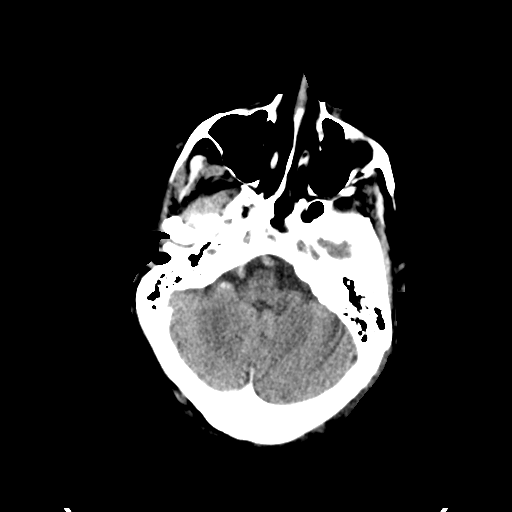
[im 10/32  brain]
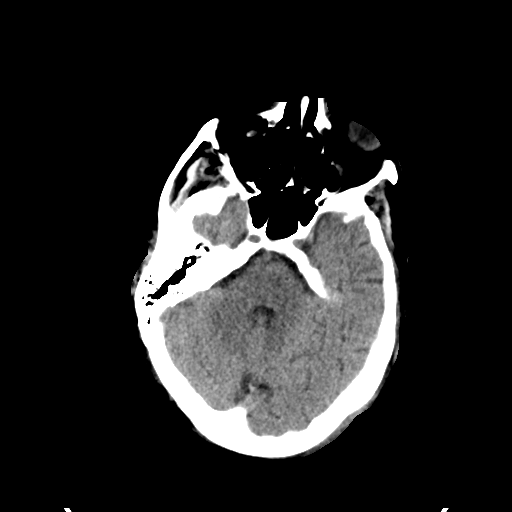
[im 10/32  bone]
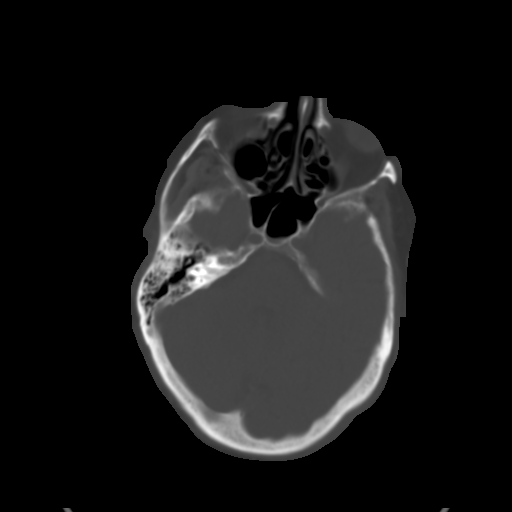
[im 12/32  brain]
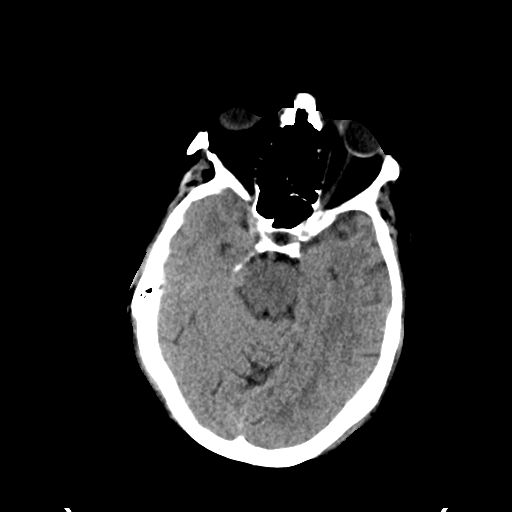
[im 14/32  brain]
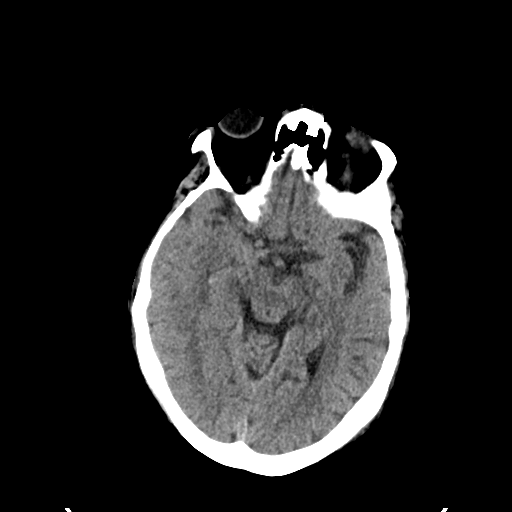
[im 17/32  brain]
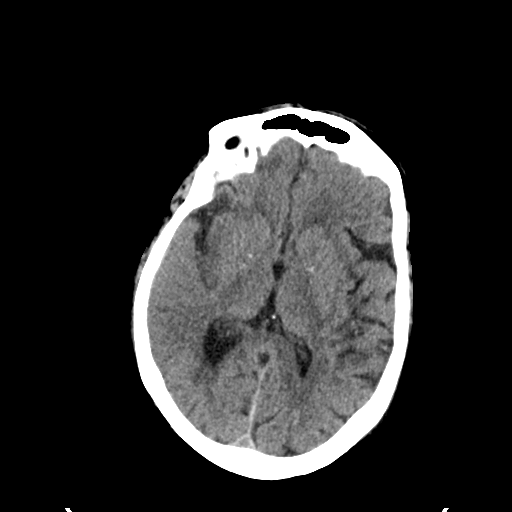
[im 18/32  brain]
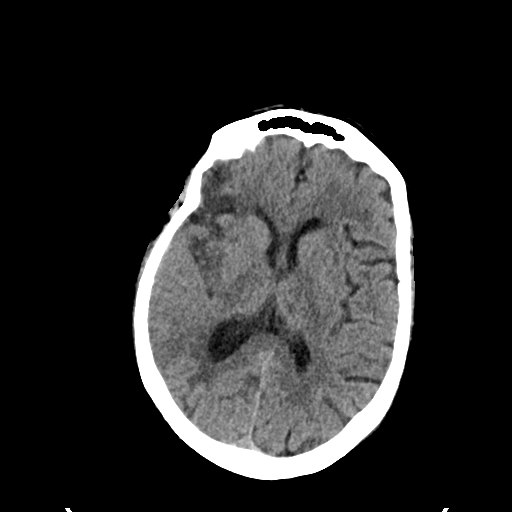
[im 18/32  bone]
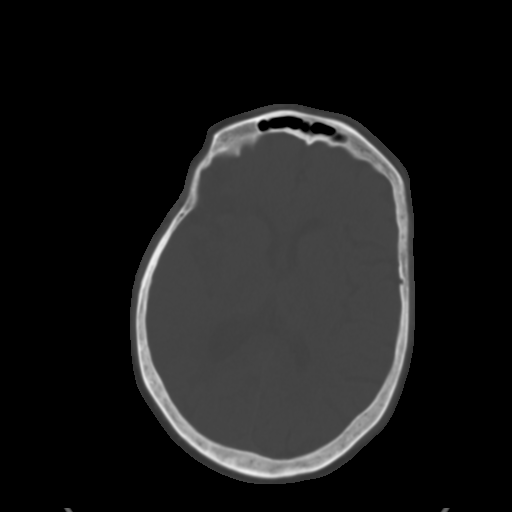
[im 20/32  brain]
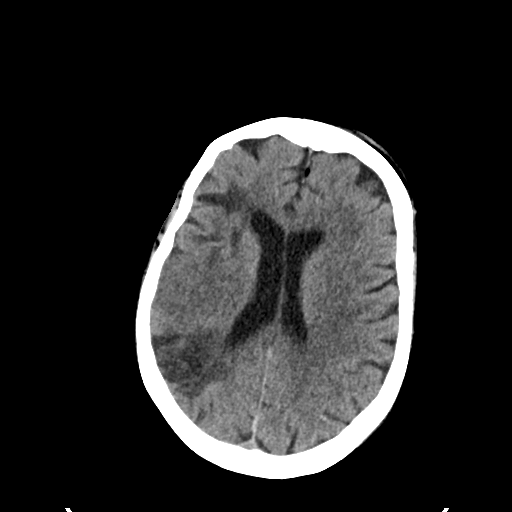
[im 22/32  brain]
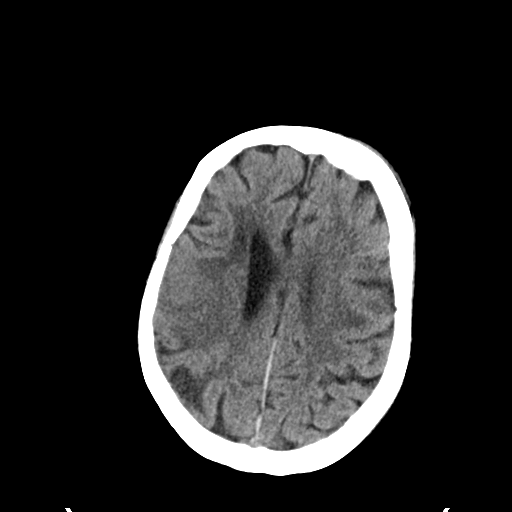
[im 24/32  brain]
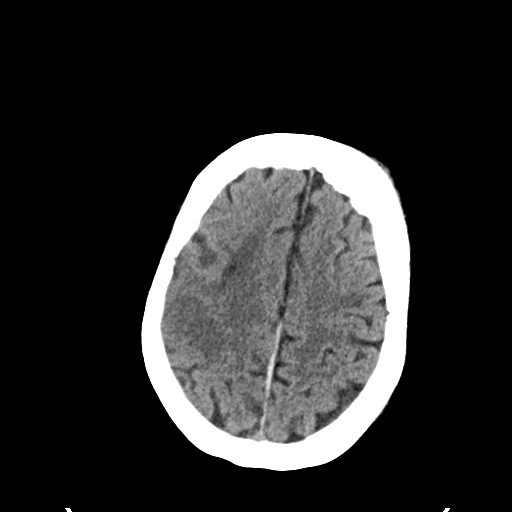
[im 26/32  brain]
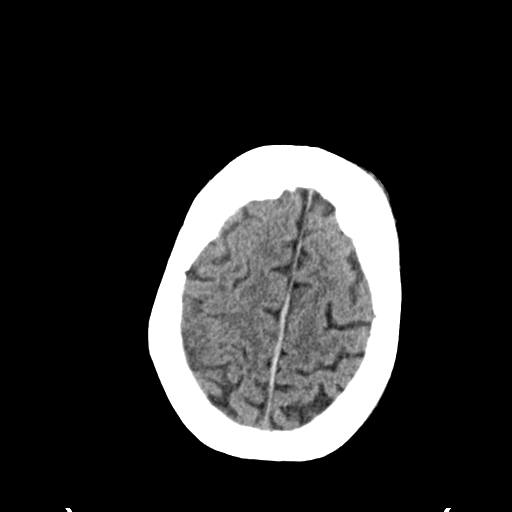
[im 26/32  bone]
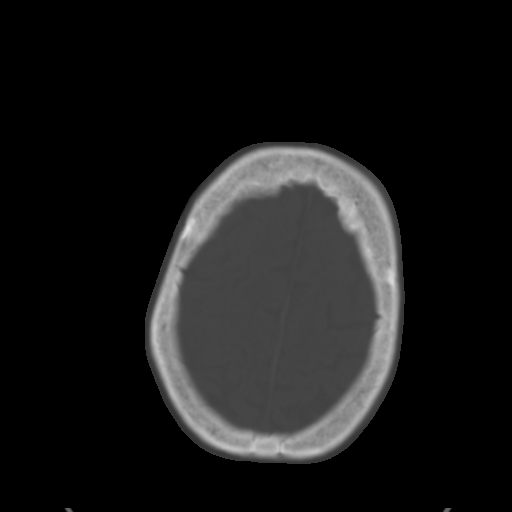
[im 28/32  brain]
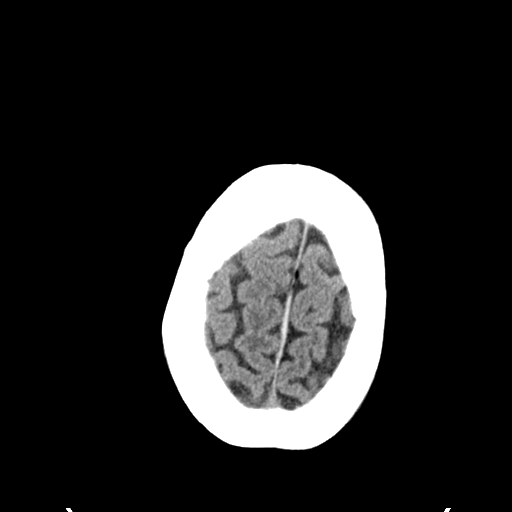
[im 30/32  brain]
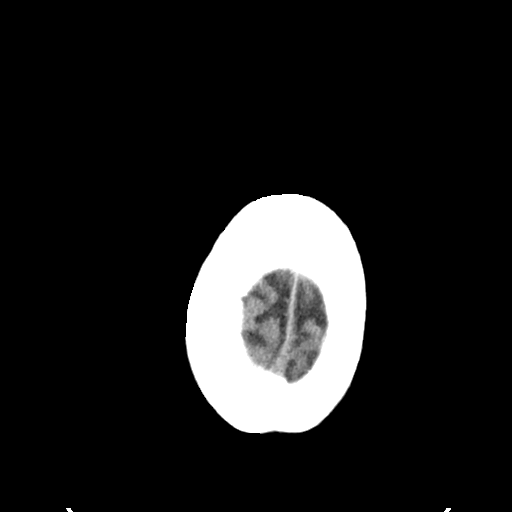

[15 of 30 positions shown; findings below may reference images not displayed]

FINDINGS: Intubated. Small volume fluid in the paranasal sinuses. Mild right
mastoid effusion. No acute osseous abnormality identified. Left face
and scalp broad-based soft tissue hematoma. Visualized orbit soft
tissues are within normal limits.

Calcified atherosclerosis at the skull base. Interval regression of
patchy hyperdensity in the posterior right MCA territory. Mild
parenchymal hypodensity in the same area compared to the
presentation study at 2781 hr on 01/29/2014. No regional mass
effect. Superimposed chronic appearing anterior and posterior MCA
division infarcts.

Stable parafalcine small volume subdural hematoma. No new
intracranial hemorrhage. No ventriculomegaly. No acute midline shift
or intracranial mass effect. Stable small chronic infarct in the
inferior right cerebellum. Stable cerebral white matter hypodensity
elsewhere. Decreased conspicuity of hyperdensity at the right MCA
bifurcation. Stable intracranial vascular hyperdensity elsewhere.
IMPRESSION: 1. Resolved right MCA territory hyperdensity present at 4365 hrs
yesterday, most likely was post Neurointervention parenchymal
contrast staining.
2. Mild cytotoxic edema suspected in that same right MCA territory.
Underlying chronic ischemic changes.
3. Stable parafalcine subdural hematoma. No new intracranial
hemorrhage or acute mass effect.
4. No new intracranial abnormality.

## 2016-02-18 ENCOUNTER — Encounter: Payer: Medicare PPO | Attending: Internal Medicine | Admitting: *Deleted

## 2016-02-18 VITALS — Ht 62.0 in | Wt 193.9 lb

## 2016-02-18 DIAGNOSIS — Z8673 Personal history of transient ischemic attack (TIA), and cerebral infarction without residual deficits: Secondary | ICD-10-CM | POA: Insufficient documentation

## 2016-02-18 DIAGNOSIS — F419 Anxiety disorder, unspecified: Secondary | ICD-10-CM | POA: Diagnosis not present

## 2016-02-18 DIAGNOSIS — E7800 Pure hypercholesterolemia, unspecified: Secondary | ICD-10-CM | POA: Diagnosis not present

## 2016-02-18 DIAGNOSIS — Z87891 Personal history of nicotine dependence: Secondary | ICD-10-CM | POA: Diagnosis not present

## 2016-02-18 DIAGNOSIS — J449 Chronic obstructive pulmonary disease, unspecified: Secondary | ICD-10-CM | POA: Insufficient documentation

## 2016-02-18 DIAGNOSIS — I4891 Unspecified atrial fibrillation: Secondary | ICD-10-CM | POA: Diagnosis not present

## 2016-02-18 DIAGNOSIS — I10 Essential (primary) hypertension: Secondary | ICD-10-CM | POA: Insufficient documentation

## 2016-02-18 NOTE — Progress Notes (Signed)
Pulmonary Individual Treatment Plan  Patient Details  Name: Summer Bradley MRN: [id number] Date of Birth: 1945-05-26 Referring Provider:    Initial Encounter Date:  Flowsheet Row Pulmonary Rehab from 02/18/2016 in [facility] Cardiac and Pulmonary Rehab  Date  02/18/16      Visit Diagnosis: COPD, moderate ([place])  Patient's Home Medications on Admission:  Current Outpatient Prescriptions:    acetaminophen (TYLENOL) 325 MG tablet, Take 650 mg by mouth every 6 (six) hours as needed (TAKE 2 TABLETS EVERY 6 HOURS AS NEEDED)., Disp: , Rfl:    albuterol (PROVENTIL HFA;VENTOLIN HFA) 108 (90 BASE) MCG/ACT inhaler, Inhale 1 puff into the lungs every 6 (six) hours as needed for wheezing or shortness of breath., Disp: , Rfl:    apixaban (ELIQUIS) 5 MG TABS tablet, Take 1 tablet (5 mg total) by mouth 2 (two) times daily., Disp: 60 tablet, Rfl: 5   bisoprolol (ZEBETA) 5 MG tablet, Take 0.5 tablets (2.5 mg total) by mouth 2 (two) times daily., Disp: 30 tablet, Rfl: 5   budesonide-formoterol (SYMBICORT) 160-4.5 MCG/ACT inhaler, Inhale 2 puffs into the lungs 2 (two) times daily., Disp: , Rfl:    Calcium Carbonate-Vitamin D 600-400 MG-UNIT per tablet, Take 1 tablet by mouth daily., Disp: , Rfl:    citalopram (CELEXA) 40 MG tablet, , Disp: , Rfl:    diltiazem (DILACOR XR) 240 MG 24 hr capsule, , Disp: , Rfl:    fluticasone (FLONASE) 50 MCG/ACT nasal spray, Place 1 spray into both nostrils daily as needed for allergies. , Disp: , Rfl:    furosemide (LASIX) 20 MG tablet, Take 1 tablet (20 mg total) by mouth 2 (two) times daily., Disp: 30 tablet, Rfl: 5   ipratropium (ATROVENT HFA) 17 MCG/ACT inhaler, Inhale 2 puffs into the lungs 4 (four) times daily., Disp: , Rfl:    levalbuterol (XOPENEX) 0.63 MG/3ML nebulizer solution, Take 3 mLs (0.63 mg total) by nebulization every 6 (six) hours as needed for wheezing or shortness of breath., Disp: 3 mL, Rfl: 12   levothyroxine (SYNTHROID, LEVOTHROID) 150 MCG tablet,  Take 150 mcg by mouth daily before breakfast. , Disp: , Rfl:    Multiple Vitamin (MULTIVITAMIN WITH MINERALS) TABS tablet, Take 1 tablet by mouth daily., Disp: , Rfl:    potassium chloride SA (K-DUR,KLOR-CON) 20 MEQ tablet, Take 2 tablets (40 mEq total) by mouth 2 (two) times daily., Disp: 30 tablet, Rfl: 0  Past Medical History: Past Medical History:  Diagnosis Date   A-fib ([place])    Anxiety    COPD (chronic obstructive pulmonary disease) ([place])    Heart disease    High cholesterol    Hypertension    Stroke ([place])     Tobacco Use: History  Smoking Status   Former Smoker  Smokeless Tobacco   Not on file    Comment: QUIT SMOKING "13 YEARS AGO"    Labs: Recent Review Flowsheet Data    Labs for ITP Cardiac and Pulmonary Rehab Latest Ref Rng & Units 01/29/2014 01/29/2014 01/30/2014 02/01/2014 02/13/2014   Cholestrol 0 - 200 mg/dL - - 114 - -   LDLCALC 0 - 99 mg/dL - - 70 - -   HDL >39 mg/dL - - 24(L) - -   Trlycerides <150 mg/dL 118 - 102 - -   Hemoglobin A1c <5.7 % - - 6.4(H) - -   PHART 7.350 - 7.450 - 7.282(L) - 7.399 7.297(L)   PCO2ART 35.0 - 45.0 mmHg - 54.5(H) - 42.3 56.0(H)   HCO3 20.0 -  24.0 mEq/L - 24.9(H) - 25.6(H) 27.5(H)   TCO2 0 - 100 mmol/L - 26.6 - 26.9 29   ACIDBASEDEF 0.0 - 2.0 mmol/L - 0.9 - - -   O2SAT % - 91.4 - 97.2 94.0       ADL [facility]SD:     Pulmonary Assessment Scores    Row Name 02/18/16 1323         ADL UCSD   ADL Phase Entry     SOB Score total 89     Rest 2     Walk 5     Stairs 5     Bath 5     Dress 5     Shop 5        Pulmonary Function Assessment:     Pulmonary Function Assessment - 02/18/16 1322      Breath   Shortness of Breath Fear of Shortness of Breath;Limiting activity;Yes      Exercise Target Goals: Date: 02/18/16  Exercise Program Goal: Individual exercise prescription set with THRR, safety & activity barriers. Participant demonstrates ability to understand and report RPE using BORG scale, to  self-measure pulse accurately, and to acknowledge the importance of the exercise prescription.  Exercise Prescription Goal: Starting with aerobic activity 30 plus minutes a day, 3 days per week for initial exercise prescription. Provide home exercise prescription and guidelines that participant acknowledges understanding prior to discharge.  Activity Barriers & Risk Stratification:     Activity Barriers & Cardiac Risk Stratification - 02/18/16 1329      Activity Barriers & Cardiac Risk Stratification   Activity Barriers --  History of a stroke. Left side numbness and tingling when use arm.      6 Minute Walk:     6 Minute Walk    Row Name 02/18/16 1307 02/18/16 1313       6 Minute Walk   Distance 560 feet 560 feet    Walk Time 5.5 minutes 5.5 minutes    # of Rest Breaks 2 2    MPH 1.15 1.15    METS 1.84 1.84    RPE 13 13    Perceived Dyspnea  3 3    VO2 Peak 3.83 3.83    Symptoms No No    Resting HR 93 bpm 93 bpm    Resting BP 136/74 136/74    Max Ex. HR  -- 122 bpm    Max Ex. BP  -- 146/68      Interval HR   Baseline HR  -- 100    1 Minute HR  -- 93    2 Minute HR  -- 108    3 Minute HR  -- 108    4 Minute HR  -- 122    5 Minute HR  -- 117    6 Minute HR  -- 100    Interval Heart Rate?  -- Yes      Interval Oxygen   Interval Oxygen?  -- Yes    Baseline Oxygen Saturation %  -- 94 %    Baseline Liters of Oxygen  -- 2 L    1 Minute Oxygen Saturation %  -- 90 %    1 Minute Liters of Oxygen  -- 2 L    2 Minute Oxygen Saturation %  -- 90 %    2 Minute Liters of Oxygen  -- 2 L    3 Minute Oxygen Saturation %  -- 90 %    3 Minute Liters  of Oxygen  -- 2 L    4 Minute Oxygen Saturation %  -- 89 %    4 Minute Liters of Oxygen  -- 2 L    5 Minute Oxygen Saturation %  -- 95 %    5 Minute Liters of Oxygen  -- 2 L    6 Minute Oxygen Saturation %  -- 86 %    6 Minute Liters of Oxygen  -- 2 L    2 Minute Post Liters of Oxygen  -- 2 L       Initial Exercise  Prescription:     Initial Exercise Prescription - 02/18/16 1300      Date of Initial Exercise RX and Referring Provider   Date 02/18/16     Oxygen   Oxygen Continuous   Liters 2     NuStep   Level 1   Minutes 15   METs 1.6     REL-XR   Level 1   Minutes 15   METs 1.6     Track   Laps 15   Minutes 15     Prescription Details   Frequency (times per week) 3   Duration Progress to 45 minutes of aerobic exercise without signs/symptoms of physical distress     Intensity   THRR 40-80% of Max Heartrate 115-137   Ratings of Perceived Exertion 11-13     Progression   Progression Continue to progress workloads to maintain intensity without signs/symptoms of physical distress.     Resistance Training   Training Prescription Yes   Weight 1   Reps 10-15      Perform Capillary Blood Glucose checks as needed.  Exercise Prescription Changes:   Exercise Comments:   Discharge Exercise Prescription (Final Exercise Prescription Changes):    Nutrition:  Target Goals: Understanding of nutrition guidelines, daily intake of sodium 1500mg , cholesterol 200mg , calories 30% from fat and 7% or less from saturated fats, daily to have 5 or more servings of fruits and vegetables.  Biometrics:     Pre Biometrics - 02/18/16 1306      Pre Biometrics   Height 5\' 2"  (1.575 m)   Weight 193 lb 14.4 oz (88 kg)   Waist Circumference 44.5 inches   Hip Circumference 43.5 inches   Waist to Hip Ratio 1.02 %   BMI (Calculated) 35.5       Nutrition Therapy Plan and Nutrition Goals:     Nutrition Therapy & Goals - 02/18/16 1311      Intervention Plan   Intervention Prescribe, educate and counsel regarding individualized specific dietary modifications aiming towards targeted core components such as weight, hypertension, lipid management, diabetes, heart failure and other comorbidities.   Expected Outcomes Short Term Goal: A plan has been developed with personal nutrition goals set  during dietitian appointment.      Nutrition Discharge: Rate Your Plate Scores:   Psychosocial: Target Goals: Acknowledge presence or absence of depression, maximize coping skills, provide positive support system. Participant is able to verbalize types and ability to use techniques and skills needed for reducing stress and depression.  Initial Review & Psychosocial Screening:     Initial Psych Review & Screening - 02/18/16 1314      Initial Review   Current issues with Current Psychotropic Meds     Family Dynamics   Good Support System? Yes  Daughter and other children     Barriers   Psychosocial barriers to participate in program There are no identifiable barriers or  psychosocial needs.;The patient should benefit from training in stress management and relaxation.     Screening Interventions   Interventions Encouraged to exercise      Quality of Life Scores:     Quality of Life - 02/18/16 1315      Quality of Life Scores   Health/Function Pre 21 %   Socioeconomic Pre 18.33 %   Psych/Spiritual Pre 21 %   Family Pre 21 %   GLOBAL Pre 20.5 %      PHQ-9: Recent Review Flowsheet Data    Depression screen [facility] 2/9 02/18/2016   Decreased Interest 2   Down, Depressed, Hopeless 1   PHQ - 2 Score 3   Altered sleeping 2   Tired, decreased energy 3   Change in appetite 3   Feeling bad or failure about yourself  3   Trouble concentrating 3   Moving slowly or fidgety/restless 3   Suicidal thoughts 0   PHQ-9 Score 20   Difficult doing work/chores Extremely dIfficult      Psychosocial Evaluation and Intervention:   Psychosocial Re-Evaluation:  Education: Education Goals: Education classes will be provided on a weekly basis, covering required topics. Participant will state understanding/return demonstration of topics presented.  Learning Barriers/Preferences:     Learning Barriers/Preferences - 02/18/16 1321      Learning Barriers/Preferences   Learning  Barriers None   Learning Preferences None      Education Topics: Initial Evaluation Education: - Verbal, written and demonstration of respiratory meds, RPE/PD scales, oximetry and breathing techniques. Instruction on use of nebulizers and MDIs: cleaning and proper use, rinsing mouth with steroid doses and importance of monitoring MDI activations. Flowsheet Row Pulmonary Rehab from 02/18/2016 in [facility] Cardiac and Pulmonary Rehab  Date  02/18/16  Educator  Sb  Instruction Review Code  2- meets goals/outcomes      General Nutrition Guidelines/Fats and Fiber: -Group instruction provided by verbal, written material, models and posters to present the general guidelines for heart healthy nutrition. Gives an explanation and review of dietary fats and fiber.   Controlling Sodium/Reading Food Labels: -Group verbal and written material supporting the discussion of sodium use in heart healthy nutrition. Review and explanation with models, verbal and written materials for utilization of the food label.   Exercise Physiology & Risk Factors: - Group verbal and written instruction with models to review the exercise physiology of the cardiovascular system and associated critical values. Details cardiovascular disease risk factors and the goals associated with each risk factor.   Aerobic Exercise & Resistance Training: - Gives group verbal and written discussion on the health impact of inactivity. On the components of aerobic and resistive training programs and the benefits of this training and how to safely progress through these programs.   Flexibility, Balance, General Exercise Guidelines: - Provides group verbal and written instruction on the benefits of flexibility and balance training programs. Provides general exercise guidelines with specific guidelines to those with heart or lung disease. Demonstration and skill practice provided.   Stress Management: - Provides group verbal and written  instruction about the health risks of elevated stress, cause of high stress, and healthy ways to reduce stress.   Depression: - Provides group verbal and written instruction on the correlation between heart/lung disease and depressed mood, treatment options, and the stigmas associated with seeking treatment.   Exercise & Equipment Safety: - Individual verbal instruction and demonstration of equipment use and safety with use of the equipment. Flowsheet Row Pulmonary Rehab from  02/18/2016 in [facility] Cardiac and Pulmonary Rehab  Date  02/18/16  Educator  Sb  Instruction Review Code  2- meets goals/outcomes      Infection Prevention: - Provides verbal and written material to individual with discussion of infection control including proper hand washing and proper equipment cleaning during exercise session. Flowsheet Row Pulmonary Rehab from 02/18/2016 in [facility] Cardiac and Pulmonary Rehab  Date  02/18/16  Educator  Sb  Instruction Review Code  2- meets goals/outcomes      Falls Prevention: - Provides verbal and written material to individual with discussion of falls prevention and safety. Flowsheet Row Pulmonary Rehab from 02/18/2016 in [facility] Cardiac and Pulmonary Rehab  Date  02/18/16  Educator  SB  Instruction Review Code  2- meets goals/outcomes      Diabetes: - Individual verbal and written instruction to review signs/symptoms of diabetes, desired ranges of glucose level fasting, after meals and with exercise. Advice that pre and post exercise glucose checks will be done for 3 sessions at entry of program.   Chronic Lung Diseases: - Group verbal and written instruction to review new updates, new respiratory medications, new advancements in procedures and treatments. Provide informative websites and "800" numbers of self-education.   Lung Procedures: - Group verbal and written instruction to describe testing methods done to diagnose lung disease. Review the outcome of test results.  Describe the treatment choices: Pulmonary Function Tests, ABGs and oximetry.   Energy Conservation: - Provide group verbal and written instruction for methods to conserve energy, plan and organize activities. Instruct on pacing techniques, use of adaptive equipment and posture/positioning to relieve shortness of breath.   Triggers: - Group verbal and written instruction to review types of environmental controls: home humidity, furnaces, filters, dust mite/pet prevention, HEPA vacuums. To discuss weather changes, air quality and the benefits of nasal washing.   Exacerbations: - Group verbal and written instruction to provide: warning signs, infection symptoms, calling MD promptly, preventive modes, and value of vaccinations. Review: effective airway clearance, coughing and/or vibration techniques. Create an Sports administrator.   Oxygen: - Individual and group verbal and written instruction on oxygen therapy. Includes supplement oxygen, available portable oxygen systems, continuous and intermittent flow rates, oxygen safety, concentrators, and Medicare reimbursement for oxygen.   Respiratory Medications: - Group verbal and written instruction to review medications for lung disease. Drug class, frequency, complications, importance of spacers, rinsing mouth after steroid MDI's, and proper cleaning methods for nebulizers.   AED/CPR: - Group verbal and written instruction with the use of models to demonstrate the basic use of the AED with the basic ABC's of resuscitation.   Breathing Retraining: - Provides individuals verbal and written instruction on purpose, frequency, and proper technique of diaphragmatic breathing and pursed-lipped breathing. Applies individual practice skills. Flowsheet Row Pulmonary Rehab from 02/18/2016 in [facility] Cardiac and Pulmonary Rehab  Date  02/18/16  Educator  Sb  Instruction Review Code  2- meets goals/outcomes      Anatomy and Physiology of the Lungs: - Group  verbal and written instruction with the use of models to provide basic lung anatomy and physiology related to function, structure and complications of lung disease.   Heart Failure: - Group verbal and written instruction on the basics of heart failure: signs/symptoms, treatments, explanation of ejection fraction, enlarged heart and cardiomyopathy.   Sleep Apnea: - Individual verbal and written instruction to review Obstructive Sleep Apnea. Review of risk factors, methods for diagnosing and types of masks and machines for OSA.  Anxiety: - Provides group, verbal and written instruction on the correlation between heart/lung disease and anxiety, treatment options, and management of anxiety.   Relaxation: - Provides group, verbal and written instruction about the benefits of relaxation for patients with heart/lung disease. Also provides patients with examples of relaxation techniques.   Knowledge Questionnaire Score:     Knowledge Questionnaire Score - 02/18/16 1322      Knowledge Questionnaire Score   Pre Score 1/10       Core Components/Risk Factors/Patient Goals at Admission:     Personal Goals and Risk Factors at Admission - 02/18/16 1311      Core Components/Risk Factors/Patient Goals on Admission    Weight Management Obesity;Weight Maintenance;Yes   Admit Weight 193 lb 14.4 oz (88 kg)   Goal Weight: Short Term 190 lb (86.2 kg)   Goal Weight: Long Term 160 lb (72.6 kg)   Expected Outcomes Short Term: Continue to assess and modify interventions until short term weight is achieved;Long Term: Adherence to nutrition and physical activity/exercise program aimed toward attainment of established weight goal;Weight Loss: Understanding of general recommendations for a balanced deficit meal plan, which promotes 1-2 lb weight loss per week and includes a negative energy balance of [phone] kcal/d   Sedentary Yes   Intervention Provide advice, education, support and counseling about  physical activity/exercise needs.;Develop an individualized exercise prescription for aerobic and resistive training based on initial evaluation findings, risk stratification, comorbidities and participant's personal goals.   Expected Outcomes Achievement of increased cardiorespiratory fitness and enhanced flexibility, muscular endurance and strength shown through measurements of functional capacity and personal statement of participant.   Increase Strength and Stamina Yes  Not able to be active without SOB stopping her   Intervention Provide advice, education, support and counseling about physical activity/exercise needs.;Develop an individualized exercise prescription for aerobic and resistive training based on initial evaluation findings, risk stratification, comorbidities and participant's personal goals.   Expected Outcomes Achievement of increased cardiorespiratory fitness and enhanced flexibility, muscular endurance and strength shown through measurements of functional capacity and personal statement of participant.   Improve shortness of breath with ADL's Yes   Intervention Provide education, individualized exercise plan and daily activity instruction to help decrease symptoms of SOB with activities of daily living.   Expected Outcomes Short Term: Achieves a reduction of symptoms when performing activities of daily living.   Develop more efficient breathing techniques such as purse lipped breathing and diaphragmatic breathing; and practicing self-pacing with activity Yes  Demonstrated purse lipped breathing today without prompt.   Intervention Provide education, demonstration and support about specific breathing techniuqes utilized for more efficient breathing. Include techniques such as pursed lipped breathing, diaphragmatic breathing and self-pacing activity.   Expected Outcomes Short Term: Participant will be able to demonstrate and use breathing techniques as needed throughout daily  activities.   Hypertension Yes   Intervention Provide education on lifestyle modifcations including regular physical activity/exercise, weight management, moderate sodium restriction and increased consumption of fresh fruit, vegetables, and low fat dairy, alcohol moderation, and smoking cessation.;Monitor prescription use compliance.   Expected Outcomes Short Term: Continued assessment and intervention until BP is < 140/63mm HG in hypertensive participants. < 130/75mm HG in hypertensive participants with diabetes, heart failure or chronic kidney disease.;Long Term: Maintenance of blood pressure at goal levels.   Lipids Yes   Intervention Provide education and support for participant on nutrition & aerobic/resistive exercise along with prescribed medications to achieve LDL 70mg , HDL >40mg .   Expected Outcomes Short  Term: Participant states understanding of desired cholesterol values and is compliant with medications prescribed. Participant is following exercise prescription and nutrition guidelines.;Long Term: Cholesterol controlled with medications as prescribed, with individualized exercise RX and with personalized nutrition plan. Value goals: LDL < 70mg , HDL > 40 mg.      Core Components/Risk Factors/Patient Goals Review:    Core Components/Risk Factors/Patient Goals at Discharge (Final Review):    ITP Comments:     ITP Comments    Row Name 02/18/16 1309           ITP Comments Initial Med review completed today ITP created fro Dr MArk Sabra Heck to sign. Records sent for scanning          Comments: Ms Buol plans to start [place] on 02/24/16 and will attend 3 days/week.

## 2016-02-18 NOTE — Patient Instructions (Signed)
Patient Instructions  Patient Details  Name: Summer Bradley MRN: [id number] Date of Birth: 05-28-1945 Referring Provider:  Allyne Gee, MD  Below are the personal goals you chose as well as exercise and nutrition goals. Our goal is to help you keep on track towards obtaining and maintaining your goals. We will be discussing your progress on these goals with you throughout the program.  Initial Exercise Prescription:     Initial Exercise Prescription - 02/18/16 1300      Date of Initial Exercise RX and Referring Provider   Date 02/18/16     Oxygen   Oxygen Continuous   Liters 2     NuStep   Level 1   Minutes 15   METs 1.6     REL-XR   Level 1   Minutes 15   METs 1.6     Track   Laps 15   Minutes 15     Prescription Details   Frequency (times per week) 3   Duration Progress to 45 minutes of aerobic exercise without signs/symptoms of physical distress     Intensity   THRR 40-80% of Max Heartrate 115-137   Ratings of Perceived Exertion 11-13     Progression   Progression Continue to progress workloads to maintain intensity without signs/symptoms of physical distress.     Resistance Training   Training Prescription Yes   Weight 1   Reps 10-15      Exercise Goals: Frequency: Be able to perform aerobic exercise three times per week working toward 3-5 days per week.  Intensity: Work with a perceived exertion of 11 (fairly light) - 15 (hard) as tolerated. Follow your new exercise prescription and watch for changes in prescription as you progress with the program. Changes will be reviewed with you when they are made.  Duration: You should be able to do 30 minutes of continuous aerobic exercise in addition to a 5 minute warm-up and a 5 minute cool-down routine.  Nutrition Goals: Your personal nutrition goals will be established when you do your nutrition analysis with the dietician.  The following are nutrition guidelines to follow: Cholesterol < 200mg /day Sodium  < 1500mg /day Fiber: Women over 50 yrs - 21 grams per day  Personal Goals:     Personal Goals and Risk Factors at Admission - 02/18/16 1311      Core Components/Risk Factors/Patient Goals on Admission    Weight Management Obesity;Weight Maintenance;Yes   Admit Weight 193 lb 14.4 oz (88 kg)   Goal Weight: Short Term 190 lb (86.2 kg)   Goal Weight: Long Term 160 lb (72.6 kg)   Expected Outcomes Short Term: Continue to assess and modify interventions until short term weight is achieved;Long Term: Adherence to nutrition and physical activity/exercise program aimed toward attainment of established weight goal;Weight Loss: Understanding of general recommendations for a balanced deficit meal plan, which promotes 1-2 lb weight loss per week and includes a negative energy balance of [phone] kcal/d   Sedentary Yes   Intervention Provide advice, education, support and counseling about physical activity/exercise needs.;Develop an individualized exercise prescription for aerobic and resistive training based on initial evaluation findings, risk stratification, comorbidities and participant's personal goals.   Expected Outcomes Achievement of increased cardiorespiratory fitness and enhanced flexibility, muscular endurance and strength shown through measurements of functional capacity and personal statement of participant.   Increase Strength and Stamina Yes  Not able to be active without SOB stopping her   Intervention Provide advice, education, support and counseling about  physical activity/exercise needs.;Develop an individualized exercise prescription for aerobic and resistive training based on initial evaluation findings, risk stratification, comorbidities and participant's personal goals.   Expected Outcomes Achievement of increased cardiorespiratory fitness and enhanced flexibility, muscular endurance and strength shown through measurements of functional capacity and personal statement of participant.    Improve shortness of breath with ADL's Yes   Intervention Provide education, individualized exercise plan and daily activity instruction to help decrease symptoms of SOB with activities of daily living.   Expected Outcomes Short Term: Achieves a reduction of symptoms when performing activities of daily living.   Develop more efficient breathing techniques such as purse lipped breathing and diaphragmatic breathing; and practicing self-pacing with activity Yes  Demonstrated purse lipped breathing today without prompt.   Intervention Provide education, demonstration and support about specific breathing techniuqes utilized for more efficient breathing. Include techniques such as pursed lipped breathing, diaphragmatic breathing and self-pacing activity.   Expected Outcomes Short Term: Participant will be able to demonstrate and use breathing techniques as needed throughout daily activities.   Hypertension Yes   Intervention Provide education on lifestyle modifcations including regular physical activity/exercise, weight management, moderate sodium restriction and increased consumption of fresh fruit, vegetables, and low fat dairy, alcohol moderation, and smoking cessation.;Monitor prescription use compliance.   Expected Outcomes Short Term: Continued assessment and intervention until BP is < 140/56mm HG in hypertensive participants. < 130/58mm HG in hypertensive participants with diabetes, heart failure or chronic kidney disease.;Long Term: Maintenance of blood pressure at goal levels.   Lipids Yes   Intervention Provide education and support for participant on nutrition & aerobic/resistive exercise along with prescribed medications to achieve LDL 70mg , HDL >40mg .   Expected Outcomes Short Term: Participant states understanding of desired cholesterol values and is compliant with medications prescribed. Participant is following exercise prescription and nutrition guidelines.;Long Term: Cholesterol controlled  with medications as prescribed, with individualized exercise RX and with personalized nutrition plan. Value goals: LDL < 70mg , HDL > 40 mg.      Tobacco Use Initial Evaluation: History  Smoking Status   Former Smoker  Smokeless Tobacco   Not on file    Comment: QUIT SMOKING "13 YEARS AGO"    Copy of goals given to participant.

## 2016-02-24 DIAGNOSIS — J449 Chronic obstructive pulmonary disease, unspecified: Secondary | ICD-10-CM

## 2016-02-24 NOTE — Progress Notes (Signed)
Daily Session Note  Patient Details  Name: Summer Bradley MRN: [id number] Date of Birth: 06/29/1945 Referring Provider:    Encounter Date: 02/24/2016  Check In:     Session Check In - 02/24/16 1256      Check-In   Location [facility]   Staff Present Earlean Shawl, BS, ACSM CEP, Exercise Physiologist;Laureen Owens Shark, BS, RRT, Respiratory Dareen Piano, BA, ACSM CEP, Exercise Physiologist   Supervising physician immediately available to respond to emergencies [facility] immediately available ER MD   Physician(s) Burlene Arnt and Joni Fears   Medication changes reported     No   Fall or balance concerns reported    No   Warm-up and Cool-down Performed as group-led instruction   Resistance Training Performed Yes   VAD Patient? No     Pain Assessment   Currently in Pain? No/denies   Multiple Pain Sites No         Goals Met:  Proper associated with RPD/PD & O2 Sat Exercise tolerated well Strength training completed today  Goals Unmet:  Not Applicable  Comments: First full day of exercise!  Patient was oriented to gym and equipment including functions, settings, policies, and procedures.  Patient's individual exercise prescription and treatment plan were reviewed.  All starting workloads were established based on the results of the 6 minute walk test done at initial orientation visit.  The plan for exercise progression was also introduced and progression will be customized based on patient's performance and goals.    Dr. Emily Filbert is Medical Director for [facility] and [facility].

## 2016-02-26 ENCOUNTER — Encounter: Payer: Self-pay | Admitting: Respiratory Therapy

## 2016-02-26 ENCOUNTER — Telehealth: Payer: Self-pay | Admitting: Respiratory Therapy

## 2016-02-26 NOTE — Telephone Encounter (Signed)
Summer Bradley's daughter called and states Summer Bradley has a stomach virus and will not be able to attend [facility] today or possibly Friday.

## 2016-02-28 ENCOUNTER — Encounter: Payer: Self-pay | Admitting: *Deleted

## 2016-02-28 ENCOUNTER — Telehealth: Payer: Self-pay | Admitting: *Deleted

## 2016-02-28 NOTE — Telephone Encounter (Signed)
Summer Bradley called and said she is sorry that she can't make [facility] today since she is sick.

## 2016-03-02 ENCOUNTER — Encounter: Payer: Self-pay | Admitting: Respiratory Therapy

## 2016-03-02 DIAGNOSIS — J449 Chronic obstructive pulmonary disease, unspecified: Secondary | ICD-10-CM

## 2016-03-02 NOTE — Progress Notes (Signed)
Pulmonary Individual Treatment Plan  Patient Details  Name: Summer Bradley MRN: [id number] Date of Birth: [birth year] Referring Provider:    Initial Encounter Date:  Flowsheet Row Pulmonary Rehab from 02/18/2016 in [facility] Cardiac and Pulmonary Rehab  Date  02/18/16      Visit Diagnosis: COPD, moderate ([place])  Patient's Home Medications on Admission:  Current Outpatient Prescriptions:    acetaminophen (TYLENOL) 325 MG tablet, Take 650 mg by mouth every 6 (six) hours as needed (TAKE 2 TABLETS EVERY 6 HOURS AS NEEDED)., Disp: , Rfl:    albuterol (PROVENTIL HFA;VENTOLIN HFA) 108 (90 BASE) MCG/ACT inhaler, Inhale 1 puff into the lungs every 6 (six) hours as needed for wheezing or shortness of breath., Disp: , Rfl:    apixaban (ELIQUIS) 5 MG TABS tablet, Take 1 tablet (5 mg total) by mouth 2 (two) times daily., Disp: 60 tablet, Rfl: 5   bisoprolol (ZEBETA) 5 MG tablet, Take 0.5 tablets (2.5 mg total) by mouth 2 (two) times daily., Disp: 30 tablet, Rfl: 5   budesonide-formoterol (SYMBICORT) 160-4.5 MCG/ACT inhaler, Inhale 2 puffs into the lungs 2 (two) times daily., Disp: , Rfl:    Calcium Carbonate-Vitamin D 600-400 MG-UNIT per tablet, Take 1 tablet by mouth daily., Disp: , Rfl:    citalopram (CELEXA) 40 MG tablet, , Disp: , Rfl:    diltiazem (DILACOR XR) 240 MG 24 hr capsule, , Disp: , Rfl:    fluticasone (FLONASE) 50 MCG/ACT nasal spray, Place 1 spray into both nostrils daily as needed for allergies. , Disp: , Rfl:    furosemide (LASIX) 20 MG tablet, Take 1 tablet (20 mg total) by mouth 2 (two) times daily., Disp: 30 tablet, Rfl: 5   ipratropium (ATROVENT HFA) 17 MCG/ACT inhaler, Inhale 2 puffs into the lungs 4 (four) times daily., Disp: , Rfl:    levalbuterol (XOPENEX) 0.63 MG/3ML nebulizer solution, Take 3 mLs (0.63 mg total) by nebulization every 6 (six) hours as needed for wheezing or shortness of breath., Disp: 3 mL, Rfl: 12   levothyroxine (SYNTHROID, LEVOTHROID) 150 MCG tablet,  Take 150 mcg by mouth daily before breakfast. , Disp: , Rfl:    Multiple Vitamin (MULTIVITAMIN WITH MINERALS) TABS tablet, Take 1 tablet by mouth daily., Disp: , Rfl:    potassium chloride SA (K-DUR,KLOR-CON) 20 MEQ tablet, Take 2 tablets (40 mEq total) by mouth 2 (two) times daily., Disp: 30 tablet, Rfl: 0  Past Medical History: Past Medical History:  Diagnosis Date   A-fib ([place])    Anxiety    COPD (chronic obstructive pulmonary disease) ([place])    Heart disease    High cholesterol    Hypertension    Stroke ([place])     Tobacco Use: History  Smoking Status   Former Smoker  Smokeless Tobacco   Not on file    Comment: QUIT SMOKING "13 YEARS AGO"    Labs: Recent Review Flowsheet Data    Labs for ITP Cardiac and Pulmonary Rehab Latest Ref Rng & Units 01/29/2014 01/29/2014 01/30/2014 02/01/2014 02/13/2014   Cholestrol 0 - 200 mg/dL - - 114 - -   LDLCALC 0 - 99 mg/dL - - 70 - -   HDL >39 mg/dL - - 24(L) - -   Trlycerides <150 mg/dL 118 - 102 - -   Hemoglobin A1c <5.7 % - - 6.4(H) - -   PHART 7.350 - 7.450 - 7.282(L) - 7.399 7.297(L)   PCO2ART 35.0 - 45.0 mmHg - 54.5(H) - 42.3 56.0(H)   HCO3 20.0 -  24.0 mEq/L - 24.9(H) - 25.6(H) 27.5(H)   TCO2 0 - 100 mmol/L - 26.6 - 26.9 29   ACIDBASEDEF 0.0 - 2.0 mmol/L - 0.9 - - -   O2SAT % - 91.4 - 97.2 94.0       ADL UCSD:     Pulmonary Assessment Scores    Row Name 02/18/16 1323         ADL UCSD   ADL Phase Entry     SOB Score total 89     Rest 2     Walk 5     Stairs 5     Bath 5     Dress 5     Shop 5        Pulmonary Function Assessment:     Pulmonary Function Assessment - 02/18/16 1322      Breath   Shortness of Breath Fear of Shortness of Breath;Limiting activity;Yes      Exercise Target Goals:    Exercise Program Goal: Individual exercise prescription set with THRR, safety & activity barriers. Participant demonstrates ability to understand and report RPE using BORG scale, to self-measure pulse  accurately, and to acknowledge the importance of the exercise prescription.  Exercise Prescription Goal: Starting with aerobic activity 30 plus minutes a day, 3 days per week for initial exercise prescription. Provide home exercise prescription and guidelines that participant acknowledges understanding prior to discharge.  Activity Barriers & Risk Stratification:     Activity Barriers & Cardiac Risk Stratification - 02/18/16 1329      Activity Barriers & Cardiac Risk Stratification   Activity Barriers --  History of a stroke. Left side numbness and tingling when use arm.      6 Minute Walk:     6 Minute Walk    Row Name 02/18/16 1307 02/18/16 1313       6 Minute Walk   Distance 560 feet 560 feet    Walk Time 5.5 minutes 5.5 minutes    # of Rest Breaks 2 2    MPH 1.15 1.15    METS 1.84 1.84    RPE 13 13    Perceived Dyspnea  3 3    VO2 Peak 3.83 3.83    Symptoms No No    Resting HR 93 bpm 93 bpm    Resting BP 136/74 136/74    Max Ex. HR  -- 122 bpm    Max Ex. BP  -- 146/68      Interval HR   Baseline HR  -- 100    1 Minute HR  -- 93    2 Minute HR  -- 108    3 Minute HR  -- 108    4 Minute HR  -- 122    5 Minute HR  -- 117    6 Minute HR  -- 100    Interval Heart Rate?  -- Yes      Interval Oxygen   Interval Oxygen?  -- Yes    Baseline Oxygen Saturation %  -- 94 %    Baseline Liters of Oxygen  -- 2 L    1 Minute Oxygen Saturation %  -- 90 %    1 Minute Liters of Oxygen  -- 2 L    2 Minute Oxygen Saturation %  -- 90 %    2 Minute Liters of Oxygen  -- 2 L    3 Minute Oxygen Saturation %  -- 90 %    3 Minute Liters  of Oxygen  -- 2 L    4 Minute Oxygen Saturation %  -- 89 %    4 Minute Liters of Oxygen  -- 2 L    5 Minute Oxygen Saturation %  -- 95 %    5 Minute Liters of Oxygen  -- 2 L    6 Minute Oxygen Saturation %  -- 86 %    6 Minute Liters of Oxygen  -- 2 L    2 Minute Post Liters of Oxygen  -- 2 L       Initial Exercise Prescription:      Initial Exercise Prescription - 02/18/16 1300      Date of Initial Exercise RX and Referring Provider   Date 02/18/16     Oxygen   Oxygen Continuous   Liters 2     NuStep   Level 1   Minutes 15   METs 1.6     REL-XR   Level 1   Minutes 15   METs 1.6     Track   Laps 15   Minutes 15     Prescription Details   Frequency (times per week) 3   Duration Progress to 45 minutes of aerobic exercise without signs/symptoms of physical distress     Intensity   THRR 40-80% of Max Heartrate 115-137   Ratings of Perceived Exertion 11-13     Progression   Progression Continue to progress workloads to maintain intensity without signs/symptoms of physical distress.     Resistance Training   Training Prescription Yes   Weight 1   Reps 10-15      Perform Capillary Blood Glucose checks as needed.  Exercise Prescription Changes:     Exercise Prescription Changes    Row Name 02/24/16 1200             Response to Exercise   Blood Pressure (Admit) 126/74       Blood Pressure (Exercise) 132/76       Blood Pressure (Exit) 122/84       Heart Rate (Admit) 95 bpm       Heart Rate (Exercise) 108 bpm       Heart Rate (Exit) 105 bpm       Oxygen Saturation (Admit) 94 %       Oxygen Saturation (Exercise) 88 %       Oxygen Saturation (Exit) 96 %       Rating of Perceived Exertion (Exercise) 13       Perceived Dyspnea (Exercise) 2       Duration Progress to 45 minutes of aerobic exercise without signs/symptoms of physical distress       Intensity THRR unchanged         Progression   Progression Continue to progress workloads to maintain intensity without signs/symptoms of physical distress.         Resistance Training   Training Prescription Yes       Weight 1         Oxygen   Oxygen Continuous       Liters 2         NuStep   Level 1       Minutes 15       METs 1.8         Biostep-RELP   Level 1       Minutes 15       METs 2          Exercise Comments:  Exercise Comments    Row Name 02/24/16 1257           Exercise Comments  First full day of exercise!  Patient was oriented to gym and equipment including functions, settings, policies, and procedures.  Patient's individual exercise prescription and treatment plan were reviewed.  All starting workloads were established based on the results of the 6 minute walk test done at initial orientation visit.  The plan for exercise progression was also introduced and progression will be customized based on patient's performance and goals.          Discharge Exercise Prescription (Final Exercise Prescription Changes):     Exercise Prescription Changes - 02/24/16 1200      Response to Exercise   Blood Pressure (Admit) 126/74   Blood Pressure (Exercise) 132/76   Blood Pressure (Exit) 122/84   Heart Rate (Admit) 95 bpm   Heart Rate (Exercise) 108 bpm   Heart Rate (Exit) 105 bpm   Oxygen Saturation (Admit) 94 %   Oxygen Saturation (Exercise) 88 %   Oxygen Saturation (Exit) 96 %   Rating of Perceived Exertion (Exercise) 13   Perceived Dyspnea (Exercise) 2   Duration Progress to 45 minutes of aerobic exercise without signs/symptoms of physical distress   Intensity THRR unchanged     Progression   Progression Continue to progress workloads to maintain intensity without signs/symptoms of physical distress.     Resistance Training   Training Prescription Yes   Weight 1     Oxygen   Oxygen Continuous   Liters 2     NuStep   Level 1   Minutes 15   METs 1.8     Biostep-RELP   Level 1   Minutes 15   METs 2       Nutrition:  Target Goals: Understanding of nutrition guidelines, daily intake of sodium 1500mg , cholesterol 200mg , calories 30% from fat and 7% or less from saturated fats, daily to have 5 or more servings of fruits and vegetables.  Biometrics:     Pre Biometrics - 02/18/16 1306      Pre Biometrics   Height 5\' 2"  (1.575 m)   Weight 193 lb 14.4 oz (88 kg)   Waist  Circumference 44.5 inches   Hip Circumference 43.5 inches   Waist to Hip Ratio 1.02 %   BMI (Calculated) 35.5       Nutrition Therapy Plan and Nutrition Goals:     Nutrition Therapy & Goals - 02/18/16 1311      Intervention Plan   Intervention Prescribe, educate and counsel regarding individualized specific dietary modifications aiming towards targeted core components such as weight, hypertension, lipid management, diabetes, heart failure and other comorbidities.   Expected Outcomes Short Term Goal: A plan has been developed with personal nutrition goals set during dietitian appointment.      Nutrition Discharge: Rate Your Plate Scores:   Psychosocial: Target Goals: Acknowledge presence or absence of depression, maximize coping skills, provide positive support system. Participant is able to verbalize types and ability to use techniques and skills needed for reducing stress and depression.  Initial Review & Psychosocial Screening:     Initial Psych Review & Screening - 02/18/16 1314      Initial Review   Current issues with Current Psychotropic Meds     Family Dynamics   Good Support System? Yes  Daughter and other children     Barriers   Psychosocial barriers to participate in program There are no identifiable barriers  or psychosocial needs.;The patient should benefit from training in stress management and relaxation.     Screening Interventions   Interventions Encouraged to exercise      Quality of Life Scores:     Quality of Life - 02/18/16 1315      Quality of Life Scores   Health/Function Pre 21 %   Socioeconomic Pre 18.33 %   Psych/Spiritual Pre 21 %   Family Pre 21 %   GLOBAL Pre 20.5 %      PHQ-9: Recent Review Flowsheet Data    Depression screen [facility] 2/9 02/18/2016   Decreased Interest 2   Down, Depressed, Hopeless 1   PHQ - 2 Score 3   Altered sleeping 2   Tired, decreased energy 3   Change in appetite 3   Feeling bad or failure about yourself   3   Trouble concentrating 3   Moving slowly or fidgety/restless 3   Suicidal thoughts 0   PHQ-9 Score 20   Difficult doing work/chores Extremely dIfficult      Psychosocial Evaluation and Intervention:   Psychosocial Re-Evaluation:     Psychosocial Re-Evaluation    Row Name 02/28/16 1027             Psychosocial Re-Evaluation   Comments Rosey called and said she is sorry that she can't make [facility] today since she is sick.          Education: Education Goals: Education classes will be provided on a weekly basis, covering required topics. Participant will state understanding/return demonstration of topics presented.  Learning Barriers/Preferences:     Learning Barriers/Preferences - 02/18/16 1321      Learning Barriers/Preferences   Learning Barriers None   Learning Preferences None      Education Topics: Initial Evaluation Education: - Verbal, written and demonstration of respiratory meds, RPE/PD scales, oximetry and breathing techniques. Instruction on use of nebulizers and MDIs: cleaning and proper use, rinsing mouth with steroid doses and importance of monitoring MDI activations. Flowsheet Row Pulmonary Rehab from 02/18/2016 in [facility] Cardiac and Pulmonary Rehab  Date  02/18/16  Educator  Sb  Instruction Review Code  2- meets goals/outcomes      General Nutrition Guidelines/Fats and Fiber: -Group instruction provided by verbal, written material, models and posters to present the general guidelines for heart healthy nutrition. Gives an explanation and review of dietary fats and fiber.   Controlling Sodium/Reading Food Labels: -Group verbal and written material supporting the discussion of sodium use in heart healthy nutrition. Review and explanation with models, verbal and written materials for utilization of the food label.   Exercise Physiology & Risk Factors: - Group verbal and written instruction with models to review the exercise physiology  of the cardiovascular system and associated critical values. Details cardiovascular disease risk factors and the goals associated with each risk factor.   Aerobic Exercise & Resistance Training: - Gives group verbal and written discussion on the health impact of inactivity. On the components of aerobic and resistive training programs and the benefits of this training and how to safely progress through these programs.   Flexibility, Balance, General Exercise Guidelines: - Provides group verbal and written instruction on the benefits of flexibility and balance training programs. Provides general exercise guidelines with specific guidelines to those with heart or lung disease. Demonstration and skill practice provided.   Stress Management: - Provides group verbal and written instruction about the health risks of elevated stress, cause of high stress, and healthy ways  to reduce stress.   Depression: - Provides group verbal and written instruction on the correlation between heart/lung disease and depressed mood, treatment options, and the stigmas associated with seeking treatment.   Exercise & Equipment Safety: - Individual verbal instruction and demonstration of equipment use and safety with use of the equipment. Flowsheet Row Pulmonary Rehab from 02/24/2016 in [facility] Cardiac and Pulmonary Rehab  Date  02/24/16  Educator  AS  Instruction Review Code  2- meets goals/outcomes      Infection Prevention: - Provides verbal and written material to individual with discussion of infection control including proper hand washing and proper equipment cleaning during exercise session. Flowsheet Row Pulmonary Rehab from 02/24/2016 in [facility] Cardiac and Pulmonary Rehab  Date  02/24/16  Educator  AS  Instruction Review Code  2- meets goals/outcomes      Falls Prevention: - Provides verbal and written material to individual with discussion of falls prevention and safety. Flowsheet Row Pulmonary Rehab from  02/18/2016 in [facility] Cardiac and Pulmonary Rehab  Date  02/18/16  Educator  SB  Instruction Review Code  2- meets goals/outcomes      Diabetes: - Individual verbal and written instruction to review signs/symptoms of diabetes, desired ranges of glucose level fasting, after meals and with exercise. Advice that pre and post exercise glucose checks will be done for 3 sessions at entry of program.   Chronic Lung Diseases: - Group verbal and written instruction to review new updates, new respiratory medications, new advancements in procedures and treatments. Provide informative websites and "800" numbers of self-education.   Lung Procedures: - Group verbal and written instruction to describe testing methods done to diagnose lung disease. Review the outcome of test results. Describe the treatment choices: Pulmonary Function Tests, ABGs and oximetry.   Energy Conservation: - Provide group verbal and written instruction for methods to conserve energy, plan and organize activities. Instruct on pacing techniques, use of adaptive equipment and posture/positioning to relieve shortness of breath.   Triggers: - Group verbal and written instruction to review types of environmental controls: home humidity, furnaces, filters, dust mite/pet prevention, HEPA vacuums. To discuss weather changes, air quality and the benefits of nasal washing.   Exacerbations: - Group verbal and written instruction to provide: warning signs, infection symptoms, calling MD promptly, preventive modes, and value of vaccinations. Review: effective airway clearance, coughing and/or vibration techniques. Create an Sports administrator.   Oxygen: - Individual and group verbal and written instruction on oxygen therapy. Includes supplement oxygen, available portable oxygen systems, continuous and intermittent flow rates, oxygen safety, concentrators, and Medicare reimbursement for oxygen.   Respiratory Medications: - Group verbal and written  instruction to review medications for lung disease. Drug class, frequency, complications, importance of spacers, rinsing mouth after steroid MDI's, and proper cleaning methods for nebulizers.   AED/CPR: - Group verbal and written instruction with the use of models to demonstrate the basic use of the AED with the basic ABC's of resuscitation.   Breathing Retraining: - Provides individuals verbal and written instruction on purpose, frequency, and proper technique of diaphragmatic breathing and pursed-lipped breathing. Applies individual practice skills. Flowsheet Row Pulmonary Rehab from 02/24/2016 in [facility] Cardiac and Pulmonary Rehab  Date  02/24/16  Educator  AS  Instruction Review Code  2- meets goals/outcomes      Anatomy and Physiology of the Lungs: - Group verbal and written instruction with the use of models to provide basic lung anatomy and physiology related to function, structure and complications of lung  disease.   Heart Failure: - Group verbal and written instruction on the basics of heart failure: signs/symptoms, treatments, explanation of ejection fraction, enlarged heart and cardiomyopathy.   Sleep Apnea: - Individual verbal and written instruction to review Obstructive Sleep Apnea. Review of risk factors, methods for diagnosing and types of masks and machines for OSA.   Anxiety: - Provides group, verbal and written instruction on the correlation between heart/lung disease and anxiety, treatment options, and management of anxiety.   Relaxation: - Provides group, verbal and written instruction about the benefits of relaxation for patients with heart/lung disease. Also provides patients with examples of relaxation techniques.   Knowledge Questionnaire Score:     Knowledge Questionnaire Score - 02/18/16 1322      Knowledge Questionnaire Score   Pre Score 1/10       Core Components/Risk Factors/Patient Goals at Admission:     Personal Goals and Risk Factors at  Admission - 02/18/16 1311      Core Components/Risk Factors/Patient Goals on Admission    Weight Management Obesity;Weight Maintenance;Yes   Admit Weight 193 lb 14.4 oz (88 kg)   Goal Weight: Short Term 190 lb (86.2 kg)   Goal Weight: Long Term 160 lb (72.6 kg)   Expected Outcomes Short Term: Continue to assess and modify interventions until short term weight is achieved;Long Term: Adherence to nutrition and physical activity/exercise program aimed toward attainment of established weight goal;Weight Loss: Understanding of general recommendations for a balanced deficit meal plan, which promotes 1-2 lb weight loss per week and includes a negative energy balance of [phone] kcal/d   Sedentary Yes   Intervention Provide advice, education, support and counseling about physical activity/exercise needs.;Develop an individualized exercise prescription for aerobic and resistive training based on initial evaluation findings, risk stratification, comorbidities and participant's personal goals.   Expected Outcomes Achievement of increased cardiorespiratory fitness and enhanced flexibility, muscular endurance and strength shown through measurements of functional capacity and personal statement of participant.   Increase Strength and Stamina Yes  Not able to be active without SOB stopping her   Intervention Provide advice, education, support and counseling about physical activity/exercise needs.;Develop an individualized exercise prescription for aerobic and resistive training based on initial evaluation findings, risk stratification, comorbidities and participant's personal goals.   Expected Outcomes Achievement of increased cardiorespiratory fitness and enhanced flexibility, muscular endurance and strength shown through measurements of functional capacity and personal statement of participant.   Improve shortness of breath with ADL's Yes   Intervention Provide education, individualized exercise plan and daily  activity instruction to help decrease symptoms of SOB with activities of daily living.   Expected Outcomes Short Term: Achieves a reduction of symptoms when performing activities of daily living.   Develop more efficient breathing techniques such as purse lipped breathing and diaphragmatic breathing; and practicing self-pacing with activity Yes  Demonstrated purse lipped breathing today without prompt.   Intervention Provide education, demonstration and support about specific breathing techniuqes utilized for more efficient breathing. Include techniques such as pursed lipped breathing, diaphragmatic breathing and self-pacing activity.   Expected Outcomes Short Term: Participant will be able to demonstrate and use breathing techniques as needed throughout daily activities.   Hypertension Yes   Intervention Provide education on lifestyle modifcations including regular physical activity/exercise, weight management, moderate sodium restriction and increased consumption of fresh fruit, vegetables, and low fat dairy, alcohol moderation, and smoking cessation.;Monitor prescription use compliance.   Expected Outcomes Short Term: Continued assessment and intervention until BP is < 140/90mm  HG in hypertensive participants. < 130/81mm HG in hypertensive participants with diabetes, heart failure or chronic kidney disease.;Long Term: Maintenance of blood pressure at goal levels.   Lipids Yes   Intervention Provide education and support for participant on nutrition & aerobic/resistive exercise along with prescribed medications to achieve LDL 70mg , HDL >40mg .   Expected Outcomes Short Term: Participant states understanding of desired cholesterol values and is compliant with medications prescribed. Participant is following exercise prescription and nutrition guidelines.;Long Term: Cholesterol controlled with medications as prescribed, with individualized exercise RX and with personalized nutrition plan. Value goals: LDL  < 70mg , HDL > 40 mg.      Core Components/Risk Factors/Patient Goals Review:    Core Components/Risk Factors/Patient Goals at Discharge (Final Review):    ITP Comments:     ITP Comments    Row Name 02/18/16 1309 02/26/16 0934         ITP Comments Initial Med review completed today ITP created fro Dr Elta Gu[place] Sabra Heck to sign. Records sent for scanning Ms Rome's daughter called and states Ms Rathsack has a stomach virus and will not be able to attend [facility] today or possibly Friday.         Comments: 30 day note review

## 2016-03-09 DIAGNOSIS — J449 Chronic obstructive pulmonary disease, unspecified: Secondary | ICD-10-CM | POA: Diagnosis not present

## 2016-03-09 NOTE — Progress Notes (Signed)
Daily Session Note  Patient Details  Name: Jakera Beaupre MRN: [id number] Date of Birth: August 29, 1945 Referring Provider:    Encounter Date: 03/09/2016  Check In:     Session Check In - 03/09/16 1323      Check-In   Location [facility]   Staff Present Carson Myrtle, BS, RRT, Respiratory Therapist;Kelly Amedeo Plenty, BS, ACSM CEP, Exercise Physiologist;Amanda Oletta Darter, BA, ACSM CEP, Exercise Physiologist   Supervising physician immediately available to respond to emergencies [facility] immediately available ER MD   Physician(s) Mariea Clonts and Marcelene Butte   Medication changes reported     No   Fall or balance concerns reported    No   Warm-up and Cool-down Performed as group-led instruction   Resistance Training Performed Yes   VAD Patient? No     Pain Assessment   Currently in Pain? No/denies   Multiple Pain Sites No         Goals Met:  Proper associated with RPD/PD & O2 Sat Independence with exercise equipment Exercise tolerated well Strength training completed today  Goals Unmet:  Not Applicable  Comments: Pt able to follow exercise prescription today without complaint.  Will continue to monitor for progression.    Dr. Emily Filbert is Medical Director for [place] and [facility].

## 2016-03-11 ENCOUNTER — Encounter: Payer: Medicare PPO | Admitting: Respiratory Therapy

## 2016-03-11 DIAGNOSIS — J449 Chronic obstructive pulmonary disease, unspecified: Secondary | ICD-10-CM

## 2016-03-11 NOTE — Progress Notes (Signed)
Daily Session Note  Patient Details  Name: Summer Bradley MRN: [id number] Date of Birth: [birth year] Referring Provider:    Encounter Date: 03/11/2016  Check In:     Session Check In - 03/11/16 1140      Check-In   Location [facility]   Staff Present Carson Myrtle, BS, RRT, Respiratory Lennie Hummer, MA, ACSM RCEP, Exercise Physiologist;Danette Weinfeld RN BSN   Supervising physician immediately available to respond to emergencies [facility] immediately available ER MD   Physician(s) Drs. Malinda and Lord   Medication changes reported     No   Fall or balance concerns reported    No   Warm-up and Cool-down Performed as group-led Location manager Performed Yes   VAD Patient? No     Pain Assessment   Currently in Pain? No/denies   Multiple Pain Sites No         Goals Met:  Proper associated with RPD/PD & O2 Sat Independence with exercise equipment Exercise tolerated well No report of cardiac concerns or symptoms Strength training completed today  Goals Unmet:  Not Applicable  Comments: Pt able to follow exercise prescription today without complaint.  Will continue to monitor for progression.    Dr. Emily Filbert is Medical Director for [place] and [facility].

## 2016-03-13 ENCOUNTER — Encounter: Payer: Medicare PPO | Admitting: *Deleted

## 2016-03-13 DIAGNOSIS — J449 Chronic obstructive pulmonary disease, unspecified: Secondary | ICD-10-CM

## 2016-03-13 NOTE — Progress Notes (Addendum)
Daily Session Note  Patient Details  Name: Summer Bradley MRN: [id number] Date of Birth: 15-Jul-1945 Referring Provider:    Encounter Date: 03/13/2016  Check In:     Session Check In - 03/13/16 1210      Check-In   Location [facility]   Staff Present Gerlene Burdock, RN, Levie Heritage, MA, ACSM RCEP, Exercise Physiologist;Patricia Surles RN BSN   Supervising physician immediately available to respond to emergencies [facility] immediately available ER MD   Physician(s) Dr. Kerman Passey and Dr. Quentin Cornwall   Medication changes reported     No   Fall or balance concerns reported    No   Warm-up and Cool-down Performed on first and last piece of equipment   Resistance Training Performed Yes   VAD Patient? No     Pain Assessment   Currently in Pain? No/denies         Goals Met:  Proper associated with RPD/PD & O2 Sat Exercise tolerated well Queuing for purse lip breathing Strength training completed today  Goals Unmet:  Not Applicable  Comments: Pt able to follow exercise prescription today without complaint.  Will continue to monitor for progression. Reviewed home exercise with pt today.  Pt plans to walk and use stationary bike at home for exercise.  Reviewed THR, pulse, RPE, sign and symptoms, and when to call 911 or MD.  Also discussed weather considerations and indoor options.  Pt voiced understanding. Alberteen Sam, MA, ACSM RCEP 03/13/2016 1:31 PM    Dr. Emily Filbert is Medical Director for [place] and [facility].

## 2016-03-18 ENCOUNTER — Encounter: Payer: Self-pay | Admitting: Respiratory Therapy

## 2016-03-18 ENCOUNTER — Telehealth: Payer: Self-pay | Admitting: Respiratory Therapy

## 2016-03-18 DIAGNOSIS — J449 Chronic obstructive pulmonary disease, unspecified: Secondary | ICD-10-CM

## 2016-03-18 NOTE — Telephone Encounter (Signed)
Summer Bradley's daughter called to inform us that she has the flu and will not be able to bring Summer Bradley to [name] until possibly Friday.

## 2016-03-20 ENCOUNTER — Encounter: Payer: Medicare PPO | Attending: Internal Medicine | Admitting: *Deleted

## 2016-03-20 DIAGNOSIS — F419 Anxiety disorder, unspecified: Secondary | ICD-10-CM | POA: Diagnosis not present

## 2016-03-20 DIAGNOSIS — Z8673 Personal history of transient ischemic attack (TIA), and cerebral infarction without residual deficits: Secondary | ICD-10-CM | POA: Diagnosis not present

## 2016-03-20 DIAGNOSIS — I10 Essential (primary) hypertension: Secondary | ICD-10-CM | POA: Diagnosis not present

## 2016-03-20 DIAGNOSIS — Z87891 Personal history of nicotine dependence: Secondary | ICD-10-CM | POA: Insufficient documentation

## 2016-03-20 DIAGNOSIS — I4891 Unspecified atrial fibrillation: Secondary | ICD-10-CM | POA: Diagnosis not present

## 2016-03-20 DIAGNOSIS — J449 Chronic obstructive pulmonary disease, unspecified: Secondary | ICD-10-CM | POA: Diagnosis not present

## 2016-03-20 DIAGNOSIS — E7800 Pure hypercholesterolemia, unspecified: Secondary | ICD-10-CM | POA: Diagnosis not present

## 2016-03-23 ENCOUNTER — Encounter: Payer: Medicare PPO | Admitting: *Deleted

## 2016-03-23 DIAGNOSIS — J449 Chronic obstructive pulmonary disease, unspecified: Secondary | ICD-10-CM

## 2016-03-23 NOTE — Progress Notes (Signed)
Daily Session Note  Patient Details  Name: Summer Bradley MRN: [id number] Date of Birth: 1945-03-02 Referring Provider:    Encounter Date: 03/23/2016  Check In:     Session Check In - 03/23/16 1221      Check-In   Location [facility]   Staff Present Earlean Shawl, BS, ACSM CEP, Exercise Physiologist;Laureen Owens Shark, BS, RRT, Respiratory Therapist;Mary Kellie Shropshire, RN, BSN, MA   Supervising physician immediately available to respond to emergencies [facility] immediately available ER MD   Physician(s) Corky Downs and Jimmye Norman   Medication changes reported     No   Fall or balance concerns reported    No   Warm-up and Cool-down Performed on first and last piece of equipment   Resistance Training Performed Yes   VAD Patient? No     Pain Assessment   Currently in Pain? No/denies   Multiple Pain Sites No         Goals Met:  Proper associated with RPD/PD & O2 Sat Independence with exercise equipment Exercise tolerated well Strength training completed today  Goals Unmet:  Not Applicable  Comments: Pt able to follow exercise prescription today without complaint.  Will continue to monitor for progression.    Dr. Emily Filbert is Medical Director for [place] and [facility].

## 2016-03-23 NOTE — Progress Notes (Signed)
Daily Session Note  Patient Details  Name: Summer Bradley MRN: [id number] Date of Birth: [birth year] Referring Provider:    Encounter Date: 03/20/2016  Check In:      Goals Met:  Proper associated with RPD/PD & O2 Sat Independence with exercise equipment Using PLB without cueing & demonstrates good technique Exercise tolerated well Strength training completed today  Goals Unmet:  Not Applicable  Comments: Pt able to follow exercise prescription today without complaint.  Will continue to monitor for progression.    Dr. Emily Filbert is Medical Director for [place] and [facility].

## 2016-03-25 ENCOUNTER — Encounter: Payer: Medicare PPO | Admitting: *Deleted

## 2016-03-25 DIAGNOSIS — J449 Chronic obstructive pulmonary disease, unspecified: Secondary | ICD-10-CM

## 2016-03-25 NOTE — Progress Notes (Signed)
Daily Session Note  Patient Details  Name: Summer Bradley MRN: [id number] Date of Birth: 1945-12-30 Referring Provider:    Encounter Date: 03/25/2016  Check In:     Session Check In - 03/25/16 1142      Check-In   Location [facility]   Staff Present Alberteen Sam, MA, ACSM RCEP, Exercise Physiologist;Patricia Surles RN BSN;Laureen Owens Shark, BS, RRT, Respiratory Therapist   Supervising physician immediately available to respond to emergencies [facility] immediately available ER MD   Physician(s) Drs. Malinda and Robinson   Medication changes reported     No   Fall or balance concerns reported    No   Warm-up and Cool-down Performed as group-led Location manager Performed Yes   VAD Patient? No     Pain Assessment   Currently in Pain? No/denies   Multiple Pain Sites No         Goals Met:  Proper associated with RPD/PD & O2 Sat Independence with exercise equipment Using PLB without cueing & demonstrates good technique Exercise tolerated well Strength training completed today  Goals Unmet:  Not Applicable  Comments: Pt able to follow exercise prescription today without complaint.  Will continue to monitor for progression. [name] went 7 min on the treadmill today!!   Dr. Emily Filbert is Medical Director for [place] and [facility].

## 2016-03-27 ENCOUNTER — Encounter: Payer: Medicare PPO | Admitting: *Deleted

## 2016-03-27 DIAGNOSIS — J449 Chronic obstructive pulmonary disease, unspecified: Secondary | ICD-10-CM

## 2016-03-27 NOTE — Progress Notes (Signed)
Daily Session Note  Patient Details  Name: Summer Bradley MRN: [id number] Date of Birth: Sep 11, 1945 Referring Provider:    Encounter Date: 03/27/2016  Check In:     Session Check In - 03/27/16 1214      Check-In   Location [facility]   Staff Present Alberteen Sam, MA, ACSM RCEP, Exercise Physiologist;Kavontae Pritchard RN BSN   Supervising physician immediately available to respond to emergencies [facility] immediately available ER MD   Physician(s) Drs. Schaevitz & Williams   Medication changes reported     No   Fall or balance concerns reported    No   Warm-up and Cool-down Performed as group-led Location manager Performed Yes   VAD Patient? No     Pain Assessment   Currently in Pain? No/denies   Multiple Pain Sites No         Goals Met:  Proper associated with RPD/PD & O2 Sat Independence with exercise equipment Exercise tolerated well No report of cardiac concerns or symptoms Strength training completed today  Goals Unmet:  Not Applicable  Comments: Pt able to follow exercise prescription today without complaint.  Will continue to monitor for progression.    Dr. Emily Filbert is Medical Director for [place] and [facility].

## 2016-03-30 ENCOUNTER — Encounter: Payer: Self-pay | Admitting: Respiratory Therapy

## 2016-03-30 DIAGNOSIS — J449 Chronic obstructive pulmonary disease, unspecified: Secondary | ICD-10-CM | POA: Diagnosis not present

## 2016-03-30 NOTE — Progress Notes (Signed)
Daily Session Note  Patient Details  Name: Summer Bradley MRN: [id number] Date of Birth: April 28, 1945 Referring Provider:    Encounter Date: 03/30/2016  Check In:     Session Check In - 03/30/16 1349      Check-In   Location [facility]   Staff Present Nyoka Cowden, RN, BSN, Kela Millin, BA, ACSM CEP, Exercise Physiologist;Laureen Janell Quiet, RRT, Respiratory Therapist   Supervising physician immediately available to respond to emergencies [facility] immediately available ER MD   Physician(s) Jacqualine Code and Mariea Clonts   Medication changes reported     No   Fall or balance concerns reported    No   Warm-up and Cool-down Performed as group-led instruction   Resistance Training Performed Yes   VAD Patient? No     Pain Assessment   Currently in Pain? No/denies   Multiple Pain Sites No         Goals Met:  Proper associated with RPD/PD & O2 Sat Independence with exercise equipment Exercise tolerated well Strength training completed today  Goals Unmet:  Not Applicable  Comments: Pt able to follow exercise prescription today without complaint.  Will continue to monitor for progression.    Dr. Emily Filbert is Medical Director for [place] and [facility].

## 2016-03-30 NOTE — Progress Notes (Signed)
Pulmonary Individual Treatment Plan  Patient Details  Name: Summer Bradley MRN: [id number] Date of Birth: 1945-09-14 Referring Provider:    Initial Encounter Date:  Flowsheet Row Pulmonary Rehab from 02/18/2016 in [facility] Cardiac and Pulmonary Rehab  Date  02/18/16      Visit Diagnosis: COPD, moderate ([place])  Patient's Home Medications on Admission:  Current Outpatient Prescriptions:    acetaminophen (TYLENOL) 325 MG tablet, Take 650 mg by mouth every 6 (six) hours as needed (TAKE 2 TABLETS EVERY 6 HOURS AS NEEDED)., Disp: , Rfl:    albuterol (PROVENTIL HFA;VENTOLIN HFA) 108 (90 BASE) MCG/ACT inhaler, Inhale 1 puff into the lungs every 6 (six) hours as needed for wheezing or shortness of breath., Disp: , Rfl:    apixaban (ELIQUIS) 5 MG TABS tablet, Take 1 tablet (5 mg total) by mouth 2 (two) times daily., Disp: 60 tablet, Rfl: 5   bisoprolol (ZEBETA) 5 MG tablet, Take 0.5 tablets (2.5 mg total) by mouth 2 (two) times daily., Disp: 30 tablet, Rfl: 5   budesonide-formoterol (SYMBICORT) 160-4.5 MCG/ACT inhaler, Inhale 2 puffs into the lungs 2 (two) times daily., Disp: , Rfl:    Calcium Carbonate-Vitamin D 600-400 MG-UNIT per tablet, Take 1 tablet by mouth daily., Disp: , Rfl:    citalopram (CELEXA) 40 MG tablet, , Disp: , Rfl:    diltiazem (DILACOR XR) 240 MG 24 hr capsule, , Disp: , Rfl:    fluticasone (FLONASE) 50 MCG/ACT nasal spray, Place 1 spray into both nostrils daily as needed for allergies. , Disp: , Rfl:    furosemide (LASIX) 20 MG tablet, Take 1 tablet (20 mg total) by mouth 2 (two) times daily., Disp: 30 tablet, Rfl: 5   ipratropium (ATROVENT HFA) 17 MCG/ACT inhaler, Inhale 2 puffs into the lungs 4 (four) times daily., Disp: , Rfl:    levalbuterol (XOPENEX) 0.63 MG/3ML nebulizer solution, Take 3 mLs (0.63 mg total) by nebulization every 6 (six) hours as needed for wheezing or shortness of breath., Disp: 3 mL, Rfl: 12   levothyroxine (SYNTHROID, LEVOTHROID) 150 MCG tablet,  Take 150 mcg by mouth daily before breakfast. , Disp: , Rfl:    Multiple Vitamin (MULTIVITAMIN WITH MINERALS) TABS tablet, Take 1 tablet by mouth daily., Disp: , Rfl:    potassium chloride SA (K-DUR,KLOR-CON) 20 MEQ tablet, Take 2 tablets (40 mEq total) by mouth 2 (two) times daily., Disp: 30 tablet, Rfl: 0  Past Medical History: Past Medical History:  Diagnosis Date   A-fib ([place])    Anxiety    COPD (chronic obstructive pulmonary disease) ([place])    Heart disease    High cholesterol    Hypertension    Stroke ([place])     Tobacco Use: History  Smoking Status   Former Smoker  Smokeless Tobacco   Not on file    Comment: QUIT SMOKING "13 YEARS AGO"    Labs: Recent Review Flowsheet Data    Labs for ITP Cardiac and Pulmonary Rehab Latest Ref Rng & Units 01/29/2014 01/29/2014 01/30/2014 02/01/2014 02/13/2014   Cholestrol 0 - 200 mg/dL - - 114 - -   LDLCALC 0 - 99 mg/dL - - 70 - -   HDL >39 mg/dL - - 24(L) - -   Trlycerides <150 mg/dL 118 - 102 - -   Hemoglobin A1c <5.7 % - - 6.4(H) - -   PHART 7.350 - 7.450 - 7.282(L) - 7.399 7.297(L)   PCO2ART 35.0 - 45.0 mmHg - 54.5(H) - 42.3 56.0(H)   HCO3 20.0 -  24.0 mEq/L - 24.9(H) - 25.6(H) 27.5(H)   TCO2 0 - 100 mmol/L - 26.6 - 26.9 29   ACIDBASEDEF 0.0 - 2.0 mmol/L - 0.9 - - -   O2SAT % - 91.4 - 97.2 94.0       ADL UCSD:     Pulmonary Assessment Scores    Row Name 02/18/16 1323         ADL UCSD   ADL Phase Entry     SOB Score total 89     Rest 2     Walk 5     Stairs 5     Bath 5     Dress 5     Shop 5        Pulmonary Function Assessment:     Pulmonary Function Assessment - 02/18/16 1322      Breath   Shortness of Breath Fear of Shortness of Breath;Limiting activity;Yes      Exercise Target Goals:    Exercise Program Goal: Individual exercise prescription set with THRR, safety & activity barriers. Participant demonstrates ability to understand and report RPE using BORG scale, to self-measure pulse  accurately, and to acknowledge the importance of the exercise prescription.  Exercise Prescription Goal: Starting with aerobic activity 30 plus minutes a day, 3 days per week for initial exercise prescription. Provide home exercise prescription and guidelines that participant acknowledges understanding prior to discharge.  Activity Barriers & Risk Stratification:     Activity Barriers & Cardiac Risk Stratification - 02/18/16 1329      Activity Barriers & Cardiac Risk Stratification   Activity Barriers --  History of a stroke. Left side numbness and tingling when use arm.      6 Minute Walk:     6 Minute Walk    Row Name 02/18/16 1307 02/18/16 1313       6 Minute Walk   Distance 560 feet 560 feet    Walk Time 5.5 minutes 5.5 minutes    # of Rest Breaks 2 2    MPH 1.15 1.15    METS 1.84 1.84    RPE 13 13    Perceived Dyspnea  3 3    VO2 Peak 3.83 3.83    Symptoms No No    Resting HR 93 bpm 93 bpm    Resting BP 136/74 136/74    Max Ex. HR  -- 122 bpm    Max Ex. BP  -- 146/68      Interval HR   Baseline HR  -- 100    1 Minute HR  -- 93    2 Minute HR  -- 108    3 Minute HR  -- 108    4 Minute HR  -- 122    5 Minute HR  -- 117    6 Minute HR  -- 100    Interval Heart Rate?  -- Yes      Interval Oxygen   Interval Oxygen?  -- Yes    Baseline Oxygen Saturation %  -- 94 %    Baseline Liters of Oxygen  -- 2 L    1 Minute Oxygen Saturation %  -- 90 %    1 Minute Liters of Oxygen  -- 2 L    2 Minute Oxygen Saturation %  -- 90 %    2 Minute Liters of Oxygen  -- 2 L    3 Minute Oxygen Saturation %  -- 90 %    3 Minute Liters  of Oxygen  -- 2 L    4 Minute Oxygen Saturation %  -- 89 %    4 Minute Liters of Oxygen  -- 2 L    5 Minute Oxygen Saturation %  -- 95 %    5 Minute Liters of Oxygen  -- 2 L    6 Minute Oxygen Saturation %  -- 86 %    6 Minute Liters of Oxygen  -- 2 L    2 Minute Post Liters of Oxygen  -- 2 L       Initial Exercise Prescription:      Initial Exercise Prescription - 02/18/16 1300      Date of Initial Exercise RX and Referring Provider   Date 02/18/16     Oxygen   Oxygen Continuous   Liters 2     NuStep   Level 1   Minutes 15   METs 1.6     REL-XR   Level 1   Minutes 15   METs 1.6     Track   Laps 15   Minutes 15     Prescription Details   Frequency (times per week) 3   Duration Progress to 45 minutes of aerobic exercise without signs/symptoms of physical distress     Intensity   THRR 40-80% of Max Heartrate 115-137   Ratings of Perceived Exertion 11-13     Progression   Progression Continue to progress workloads to maintain intensity without signs/symptoms of physical distress.     Resistance Training   Training Prescription Yes   Weight 1   Reps 10-15      Perform Capillary Blood Glucose checks as needed.  Exercise Prescription Changes:     Exercise Prescription Changes    Row Name 02/24/16 1200 03/13/16 1300 03/18/16 1500         Exercise Review   Progression  --  -- Yes       Response to Exercise   Blood Pressure (Admit) 126/74  -- 128/62     Blood Pressure (Exercise) 132/76  -- 130/74     Blood Pressure (Exit) 122/84  -- 112/64     Heart Rate (Admit) 95 bpm  -- 56 bpm     Heart Rate (Exercise) 108 bpm  -- 71 bpm     Heart Rate (Exit) 105 bpm  -- 56 bpm     Oxygen Saturation (Admit) 94 %  -- 96 %     Oxygen Saturation (Exercise) 88 %  -- 92 %     Oxygen Saturation (Exit) 96 %  -- 95 %     Rating of Perceived Exertion (Exercise) 13  -- 17     Perceived Dyspnea (Exercise) 2  -- 2     Symptoms  -- none none     Comments  -- Home Exercise Guidelines given 03/13/16 Home Exercise Guidelines given 03/13/16     Duration Progress to 45 minutes of aerobic exercise without signs/symptoms of physical distress Progress to 45 minutes of aerobic exercise without signs/symptoms of physical distress Progress to 45 minutes of aerobic exercise without signs/symptoms of physical distress      Intensity THRR unchanged THRR unchanged THRR unchanged       Progression   Progression Continue to progress workloads to maintain intensity without signs/symptoms of physical distress. Continue to progress workloads to maintain intensity without signs/symptoms of physical distress. Continue to progress workloads to maintain intensity without signs/symptoms of physical distress.     Average METs  --  --  1.9       Resistance Training   Training Prescription Yes Yes Yes     Weight 1 1 yellow band     Reps  --  -- 10-15       Interval Training   Interval Training  --  -- No       Oxygen   Oxygen Continuous Continuous Continuous     Liters 2 2 2        NuStep   Level 1 1 1      Minutes 15 15 15      METs 1.8 1.8 2  77 spm       REL-XR   Level  --  -- 1     Minutes  --  -- 15     METs  --  -- 1.9  38 spm       Biostep-RELP   Level 1 1  --     Minutes 15 15  --     METs 2 2  --       Track   Minutes  --  -- 5       Home Exercise Plan   Plans to continue exercise at  -- Home  walking and stationary bike Home  walking and stationary bike     Frequency  -- Add 1 additional day to program exercise sessions. Add 1 additional day to program exercise sessions.        Exercise Comments:     Exercise Comments    Row Name 02/24/16 1257 03/13/16 1331 03/18/16 1515 03/25/16 1318     Exercise Comments  First full day of exercise!  Patient was oriented to gym and equipment including functions, settings, policies, and procedures.  Patient's individual exercise prescription and treatment plan were reviewed.  All starting workloads were established based on the results of the 6 minute walk test done at initial orientation visit.  The plan for exercise progression was also introduced and progression will be customized based on patient's performance and goals. Reviewed home exercise with pt today.  Pt plans to walk and use stationary bike at home for exercise.  Reviewed THR, pulse, RPE, sign  and symptoms, and when to call 911 or MD.  Also discussed weather considerations and indoor options.  Pt voiced understanding. Jeweliana is off to a good start with exercise.  She needs lots of encouragement to keep moving.  She is able to do 15 min on each machine, but does need rest when walking.  She often gets side tracked and will stop exercisng to talk.  We will continue to monitor her progression and keep her moving. Mirian went 7 min on the treadmill today!!       Discharge Exercise Prescription (Final Exercise Prescription Changes):     Exercise Prescription Changes - 03/18/16 1500      Exercise Review   Progression Yes     Response to Exercise   Blood Pressure (Admit) 128/62   Blood Pressure (Exercise) 130/74   Blood Pressure (Exit) 112/64   Heart Rate (Admit) 56 bpm   Heart Rate (Exercise) 71 bpm   Heart Rate (Exit) 56 bpm   Oxygen Saturation (Admit) 96 %   Oxygen Saturation (Exercise) 92 %   Oxygen Saturation (Exit) 95 %   Rating of Perceived Exertion (Exercise) 17   Perceived Dyspnea (Exercise) 2   Symptoms none   Comments Home Exercise Guidelines given 03/13/16   Duration Progress to 45 minutes of aerobic exercise without  signs/symptoms of physical distress   Intensity THRR unchanged     Progression   Progression Continue to progress workloads to maintain intensity without signs/symptoms of physical distress.   Average METs 1.9     Resistance Training   Training Prescription Yes   Weight yellow band   Reps 10-15     Interval Training   Interval Training No     Oxygen   Oxygen Continuous   Liters 2     NuStep   Level 1   Minutes 15   METs 2  77 spm     REL-XR   Level 1   Minutes 15   METs 1.9  38 spm     Track   Minutes 5     Home Exercise Plan   Plans to continue exercise at Home  walking and stationary bike   Frequency Add 1 additional day to program exercise sessions.       Nutrition:  Target Goals: Understanding of nutrition guidelines,  daily intake of sodium 1500mg , cholesterol 200mg , calories 30% from fat and 7% or less from saturated fats, daily to have 5 or more servings of fruits and vegetables.  Biometrics:     Pre Biometrics - 02/18/16 1306      Pre Biometrics   Height 5\' 2"  (1.575 m)   Weight 193 lb 14.4 oz (88 kg)   Waist Circumference 44.5 inches   Hip Circumference 43.5 inches   Waist to Hip Ratio 1.02 %   BMI (Calculated) 35.5       Nutrition Therapy Plan and Nutrition Goals:     Nutrition Therapy & Goals - 02/18/16 1311      Intervention Plan   Intervention Prescribe, educate and counsel regarding individualized specific dietary modifications aiming towards targeted core components such as weight, hypertension, lipid management, diabetes, heart failure and other comorbidities.   Expected Outcomes Short Term Goal: A plan has been developed with personal nutrition goals set during dietitian appointment.      Nutrition Discharge: Rate Your Plate Scores:   Psychosocial: Target Goals: Acknowledge presence or absence of depression, maximize coping skills, provide positive support system. Participant is able to verbalize types and ability to use techniques and skills needed for reducing stress and depression.  Initial Review & Psychosocial Screening:     Initial Psych Review & Screening - 02/18/16 1314      Initial Review   Current issues with Current Psychotropic Meds     Family Dynamics   Good Support System? Yes  Daughter and other children     Barriers   Psychosocial barriers to participate in program There are no identifiable barriers or psychosocial needs.;The patient should benefit from training in stress management and relaxation.     Screening Interventions   Interventions Encouraged to exercise      Quality of Life Scores:     Quality of Life - 02/18/16 1315      Quality of Life Scores   Health/Function Pre 21 %   Socioeconomic Pre 18.33 %   Psych/Spiritual Pre 21 %    Family Pre 21 %   GLOBAL Pre 20.5 %      PHQ-9: Recent Review Flowsheet Data    Depression screen [facility] 2/9 02/18/2016   Decreased Interest 2   Down, Depressed, Hopeless 1   PHQ - 2 Score 3   Altered sleeping 2   Tired, decreased energy 3   Change in appetite 3   Feeling bad or failure about  yourself  3   Trouble concentrating 3   Moving slowly or fidgety/restless 3   Suicidal thoughts 0   PHQ-9 Score 20   Difficult doing work/chores Extremely dIfficult      Psychosocial Evaluation and Intervention:   Psychosocial Re-Evaluation:     Psychosocial Re-Evaluation    [place] Name 02/28/16 1027 03/20/16 1324           Psychosocial Re-Evaluation   Interventions  -- Encouraged to attend Pulmonary Rehabilitation for the exercise;Relaxation education      Comments Shalan called and said she is sorry that she can't make [facility]hab today since she is sick.  Tameika denies extra stress in her life right now and denies any difficulty sleeping.  She does endorse some anxiety, over "small everyday things" like when her clothes get twisted up while dressing or when she gets tangled in her O2 tubing.  We discussed using some of the relaxation and deep breathing techniques she is learning in [place].  She lives alone has wonderful support from her 2 daughters who help her keep up with her medications and medical appointments.  A neighbor brings her meals often.        Continued Psychosocial Services Needed  -- Yes        Education: Education Goals: Education classes will be provided on a weekly basis, covering required topics. Participant will state understanding/return demonstration of topics presented.  Learning Barriers/Preferences:     Learning Barriers/Preferences - 02/18/16 1321      Learning Barriers/Preferences   Learning Barriers None   Learning Preferences None      Education Topics: Initial Evaluation Education: - Verbal, written and demonstration of respiratory  meds, RPE/PD scales, oximetry and breathing techniques. Instruction on use of nebulizers and MDIs: cleaning and proper use, rinsing mouth with steroid doses and importance of monitoring MDI activations. Flowsheet Row Pulmonary Rehab from 03/25/2016 in [facility] Cardiac and Pulmonary Rehab  Date  02/18/16  Educator  Sb  Instruction Review Code  2- meets goals/outcomes      General Nutrition Guidelines/Fats and Fiber: -Group instruction provided by verbal, written material, models and posters to present the general guidelines for heart healthy nutrition. Gives an explanation and review of dietary fats and fiber.   Controlling Sodium/Reading Food Labels: -Group verbal and written material supporting the discussion of sodium use in heart healthy nutrition. Review and explanation with models, verbal and written materials for utilization of the food label. Flowsheet Row Pulmonary Rehab from 03/25/2016 in [facility] Cardiac and Pulmonary Rehab  Date  03/09/16  Educator  CR  Instruction Review Code  2- meets goals/outcomes      Exercise Physiology & Risk Factors: - Group verbal and written instruction with models to review the exercise physiology of the cardiovascular system and associated critical values. Details cardiovascular disease risk factors and the goals associated with each risk factor.   Aerobic Exercise & Resistance Training: - Gives group verbal and written discussion on the health impact of inactivity. On the components of aerobic and resistive training programs and the benefits of this training and how to safely progress through these programs.   Flexibility, Balance, General Exercise Guidelines: - Provides group verbal and written instruction on the benefits of flexibility and balance training programs. Provides general exercise guidelines with specific guidelines to those with heart or lung disease. Demonstration and skill practice provided.   Stress Management: - Provides group verbal  and written instruction about the health risks of elevated stress, cause  of high stress, and healthy ways to reduce stress.   Depression: - Provides group verbal and written instruction on the correlation between heart/lung disease and depressed mood, treatment options, and the stigmas associated with seeking treatment.   Exercise & Equipment Safety: - Individual verbal instruction and demonstration of equipment use and safety with use of the equipment. Flowsheet Row Pulmonary Rehab from 03/25/2016 in [facility] Cardiac and Pulmonary Rehab  Date  02/24/16  Educator  AS  Instruction Review Code  2- meets goals/outcomes      Infection Prevention: - Provides verbal and written material to individual with discussion of infection control including proper hand washing and proper equipment cleaning during exercise session. Flowsheet Row Pulmonary Rehab from 03/25/2016 in [facility] Cardiac and Pulmonary Rehab  Date  02/24/16  Educator  AS  Instruction Review Code  2- meets goals/outcomes      Falls Prevention: - Provides verbal and written material to individual with discussion of falls prevention and safety. Flowsheet Row Pulmonary Rehab from 03/25/2016 in [facility] Cardiac and Pulmonary Rehab  Date  02/18/16  Educator  SB  Instruction Review Code  2- meets goals/outcomes      Diabetes: - Individual verbal and written instruction to review signs/symptoms of diabetes, desired ranges of glucose level fasting, after meals and with exercise. Advice that pre and post exercise glucose checks will be done for 3 sessions at entry of program.   Chronic Lung Diseases: - Group verbal and written instruction to review new updates, new respiratory medications, new advancements in procedures and treatments. Provide informative websites and "800" numbers of self-education. Flowsheet Row Pulmonary Rehab from 03/25/2016 in [facility] Cardiac and Pulmonary Rehab  Date  03/11/16  Educator  LB  Instruction Review Code  2- meets  goals/outcomes      Lung Procedures: - Group verbal and written instruction to describe testing methods done to diagnose lung disease. Review the outcome of test results. Describe the treatment choices: Pulmonary Function Tests, ABGs and oximetry.   Energy Conservation: - Provide group verbal and written instruction for methods to conserve energy, plan and organize activities. Instruct on pacing techniques, use of adaptive equipment and posture/positioning to relieve shortness of breath.   Triggers: - Group verbal and written instruction to review types of environmental controls: home humidity, furnaces, filters, dust mite/pet prevention, HEPA vacuums. To discuss weather changes, air quality and the benefits of nasal washing.   Exacerbations: - Group verbal and written instruction to provide: warning signs, infection symptoms, calling MD promptly, preventive modes, and value of vaccinations. Review: effective airway clearance, coughing and/or vibration techniques. Create an Sports administrator.   Oxygen: - Individual and group verbal and written instruction on oxygen therapy. Includes supplement oxygen, available portable oxygen systems, continuous and intermittent flow rates, oxygen safety, concentrators, and Medicare reimbursement for oxygen.   Respiratory Medications: - Group verbal and written instruction to review medications for lung disease. Drug class, frequency, complications, importance of spacers, rinsing mouth after steroid MDI's, and proper cleaning methods for nebulizers.   AED/CPR: - Group verbal and written instruction with the use of models to demonstrate the basic use of the AED with the basic ABC's of resuscitation.   Breathing Retraining: - Provides individuals verbal and written instruction on purpose, frequency, and proper technique of diaphragmatic breathing and pursed-lipped breathing. Applies individual practice skills. Flowsheet Row Pulmonary Rehab from 03/25/2016 in  [facility] Cardiac and Pulmonary Rehab  Date  02/24/16  Educator  AS  Instruction Review Code  2- meets goals/outcomes  Anatomy and Physiology of the Lungs: - Group verbal and written instruction with the use of models to provide basic lung anatomy and physiology related to function, structure and complications of lung disease.   Heart Failure: - Group verbal and written instruction on the basics of heart failure: signs/symptoms, treatments, explanation of ejection fraction, enlarged heart and cardiomyopathy.   Sleep Apnea: - Individual verbal and written instruction to review Obstructive Sleep Apnea. Review of risk factors, methods for diagnosing and types of masks and machines for OSA.   Anxiety: - Provides group, verbal and written instruction on the correlation between heart/lung disease and anxiety, treatment options, and management of anxiety.   Relaxation: - Provides group, verbal and written instruction about the benefits of relaxation for patients with heart/lung disease. Also provides patients with examples of relaxation techniques. Flowsheet Row Pulmonary Rehab from 03/25/2016 in [facility] Cardiac and Pulmonary Rehab  Date  03/25/16  Educator  [facility]  Instruction Review Code  2- Meets goals/outcomes      Knowledge Questionnaire Score:     Knowledge Questionnaire Score - 02/18/16 1322      Knowledge Questionnaire Score   Pre Score 1/10       Core Components/Risk Factors/Patient Goals at Admission:     Personal Goals and Risk Factors at Admission - 02/18/16 1311      Core Components/Risk Factors/Patient Goals on Admission    Weight Management Obesity;Weight Maintenance;Yes   Admit Weight 193 lb 14.4 oz (88 kg)   Goal Weight: Short Term 190 lb (86.2 kg)   Goal Weight: Long Term 160 lb (72.6 kg)   Expected Outcomes Short Term: Continue to assess and modify interventions until short term weight is achieved;Long Term: Adherence to nutrition and physical activity/exercise  program aimed toward attainment of established weight goal;Weight Loss: Understanding of general recommendations for a balanced deficit meal plan, which promotes 1-2 lb weight loss per week and includes a negative energy balance of [phone] kcal/d   Sedentary Yes   Intervention Provide advice, education, support and counseling about physical activity/exercise needs.;Develop an individualized exercise prescription for aerobic and resistive training based on initial evaluation findings, risk stratification, comorbidities and participant's personal goals.   Expected Outcomes Achievement of increased cardiorespiratory fitness and enhanced flexibility, muscular endurance and strength shown through measurements of functional capacity and personal statement of participant.   Increase Strength and Stamina Yes  Not able to be active without SOB stopping her   Intervention Provide advice, education, support and counseling about physical activity/exercise needs.;Develop an individualized exercise prescription for aerobic and resistive training based on initial evaluation findings, risk stratification, comorbidities and participant's personal goals.   Expected Outcomes Achievement of increased cardiorespiratory fitness and enhanced flexibility, muscular endurance and strength shown through measurements of functional capacity and personal statement of participant.   Improve shortness of breath with ADL's Yes   Intervention Provide education, individualized exercise plan and daily activity instruction to help decrease symptoms of SOB with activities of daily living.   Expected Outcomes Short Term: Achieves a reduction of symptoms when performing activities of daily living.   Develop more efficient breathing techniques such as purse lipped breathing and diaphragmatic breathing; and practicing self-pacing with activity Yes  Demonstrated purse lipped breathing today without prompt.   Intervention Provide education,  demonstration and support about specific breathing techniuqes utilized for more efficient breathing. Include techniques such as pursed lipped breathing, diaphragmatic breathing and self-pacing activity.   Expected Outcomes Short Term: Participant will be able to demonstrate and  use breathing techniques as needed throughout daily activities.   Hypertension Yes   Intervention Provide education on lifestyle modifcations including regular physical activity/exercise, weight management, moderate sodium restriction and increased consumption of fresh fruit, vegetables, and low fat dairy, alcohol moderation, and smoking cessation.;Monitor prescription use compliance.   Expected Outcomes Short Term: Continued assessment and intervention until BP is < 140/63mm HG in hypertensive participants. < 130/31mm HG in hypertensive participants with diabetes, heart failure or chronic kidney disease.;Long Term: Maintenance of blood pressure at goal levels.   Lipids Yes   Intervention Provide education and support for participant on nutrition & aerobic/resistive exercise along with prescribed medications to achieve LDL 70mg , HDL >40mg .   Expected Outcomes Short Term: Participant states understanding of desired cholesterol values and is compliant with medications prescribed. Participant is following exercise prescription and nutrition guidelines.;Long Term: Cholesterol controlled with medications as prescribed, with individualized exercise RX and with personalized nutrition plan. Value goals: LDL < 70mg , HDL > 40 mg.      Core Components/Risk Factors/Patient Goals Review:      Goals and Risk Factor Review    Row Name 03/20/16 1328             Core Components/Risk Factors/Patient Goals Review   Personal Goals Review Weight Management/Obesity;Increase Strength and Stamina;Develop more efficient breathing techniques such as purse lipped breathing and diaphragmatic breathing and practicing self-pacing with  activity.;Improve shortness of breath with ADL's;Increase knowledge of respiratory medications and ability to use respiratory devices properly.;Hypertension;Lipids       Review Quinci is overall pleased with her progress in [facility] so far.  She has not been able to attend as regularly as she would like due to illnesses, as she relies on her daughter to bring her to class.  However, she has noticed that she is more motivated to get up and move around the house and has increased strength to do so, and decreased shortness of breath during ADL's.  She is effectively using pursed lip breathing, but states " I sometimes forget".  We discussed using PLB prior to getting in trouble with her breathing.  She continues to use 2L O2 n/c at home, and 3L O2 when at [place].  Laneya is knowledgeable about her respiratory medications.  She reports that she tries not to use her rescue inhaler often, because it "gets my a-fib going".  Her blood pressures in [facility] are WNL and she states she takes her medications as prescribed.          Expected Outcomes Continue to attend [facility] regularly.            Core Components/Risk Factors/Patient Goals at Discharge (Final Review):      Goals and Risk Factor Review - 03/20/16 1328      Core Components/Risk Factors/Patient Goals Review   Personal Goals Review Weight Management/Obesity;Increase Strength and Stamina;Develop more efficient breathing techniques such as purse lipped breathing and diaphragmatic breathing and practicing self-pacing with activity.;Improve shortness of breath with ADL's;Increase knowledge of respiratory medications and ability to use respiratory devices properly.;Hypertension;Lipids   Review Zamiyah is overall pleased with her progress in [facility] so far.  She has not been able to attend as regularly as she would like due to illnesses, as she relies on her daughter to bring her to class.  However, she has noticed that she is more motivated to get up  and move around the house and has increased strength to do so, and decreased shortness of breath during ADL's.  She  is effectively using pursed lip breathing, but states " I sometimes forget".  We discussed using PLB prior to getting in trouble with her breathing.  She continues to use 2L O2 n/c at home, and 3L O2 when at [place].  Hina is knowledgeable about her respiratory medications.  She reports that she tries not to use her rescue inhaler often, because it "gets my a-fib going".  Her blood pressures in [facility] are WNL and she states she takes her medications as prescribed.      Expected Outcomes Continue to attend [facility] regularly.        ITP Comments:     ITP Comments    Row Name 02/18/16 1309 02/26/16 0934 03/18/16 0844 03/20/16 1137     ITP Comments Initial Med review completed today ITP created fro Dr Elta Guadeloupe Sabra Heck to sign. Records sent for scanning Ms Rice's daughter called and states Ms Frevert has a stomach virus and will not be able to attend [facility] today or possibly Friday. Ms Simenson's daughter called to inform us that she has the flu and will not be able to bring Ms Labrum to [facility] until possibly Friday. Attended [employer] Education Class       Comments:  30 Day Note Review

## 2016-04-01 ENCOUNTER — Encounter: Payer: Medicare PPO | Admitting: Respiratory Therapy

## 2016-04-01 DIAGNOSIS — J449 Chronic obstructive pulmonary disease, unspecified: Secondary | ICD-10-CM

## 2016-04-01 NOTE — Progress Notes (Signed)
Daily Session Note  Patient Details  Name: Summer Bradley MRN: [id number] Date of Birth: 02-20-1945 Referring Provider:    Encounter Date: 04/01/2016  Check In:     Session Check In - 04/01/16 1141      Check-In   Location [facility]   Staff Present Carson Myrtle, BS, RRT, Respiratory Lennie Hummer, MA, ACSM RCEP, Exercise Physiologist;Haniah Penny RN BSN   Supervising physician immediately available to respond to emergencies [facility] immediately available ER MD   Physician(s) Drs. Malinda & McShane   Medication changes reported     No   Fall or balance concerns reported    No   Warm-up and Cool-down Performed as group-led instruction   Resistance Training Performed Yes   VAD Patient? No     Pain Assessment   Currently in Pain? No/denies   Multiple Pain Sites No         Goals Met:  Proper associated with RPD/PD & O2 Sat Improved SOB with ADL's Using PLB without cueing & demonstrates good technique Exercise tolerated well Strength training completed today  Goals Unmet:  Not Applicable  Comments: Pt able to follow exercise prescription today without complaint.  Will continue to monitor for progression.    Dr. Emily Filbert is Medical Director for [place] and [facility].

## 2016-04-03 ENCOUNTER — Encounter: Payer: Medicare PPO | Admitting: *Deleted

## 2016-04-03 DIAGNOSIS — J449 Chronic obstructive pulmonary disease, unspecified: Secondary | ICD-10-CM | POA: Diagnosis not present

## 2016-04-03 NOTE — Progress Notes (Signed)
Daily Session Note  Patient Details  Name: Summer Bradley MRN: [id number] Date of Birth: 08/18/1945 Referring Provider:    Encounter Date: 04/03/2016  Check In:     Session Check In - 04/03/16 1143      Check-In   Location [facility]   Staff Present  , MA, ACSM RCEP, Exercise Physiologist;Carroll Enterkin, RN, BSN;Other   Supervising physician immediately available to respond to emergencies [facility] immediately available ER MD   Physician(s) Drs. Yao and [place]   Medication changes reported     No   Fall or balance concerns reported    No   Warm-up and Cool-down Performed as group-led instruction   Resistance Training Performed Yes   VAD Patient? No     Pain Assessment   Currently in Pain? No/denies   Multiple Pain Sites No         Goals Met:  Proper associated with RPD/PD & O2 Sat Using PLB without cueing & demonstrates good technique Exercise tolerated well Strength training completed today Improved exercise equipment tolerance!!!  Goals Unmet:  Not Applicable  Comments: Pt able to follow exercise prescription today without complaint.  Will continue to monitor for progression.    Dr. Mark Miller is Medical Director for [facility] and [facility]. 

## 2016-04-06 ENCOUNTER — Encounter: Payer: Medicare PPO | Admitting: *Deleted

## 2016-04-06 DIAGNOSIS — J449 Chronic obstructive pulmonary disease, unspecified: Secondary | ICD-10-CM | POA: Diagnosis not present

## 2016-04-06 NOTE — Progress Notes (Signed)
Daily Session Note  Patient Details  Name: Summer Bradley MRN: [id number] Date of Birth: Jul 11, 1945 Referring Provider:    Encounter Date: 04/06/2016  Check In:     Session Check In - 04/06/16 1222      Check-In   Location [facility]   Staff Present Earlean Shawl, BS, ACSM CEP, Exercise Physiologist;Laureen Owens Shark, BS, RRT, Respiratory Dareen Piano, BA, ACSM CEP, Exercise Physiologist   Supervising physician immediately available to respond to emergencies [facility] immediately available ER MD   Physician(s) Alfred Levins and Corky Downs   Medication changes reported     No   Fall or balance concerns reported    No   Warm-up and Cool-down Performed on first and last piece of equipment   Resistance Training Performed Yes   VAD Patient? No     Pain Assessment   Currently in Pain? No/denies   Multiple Pain Sites No         Goals Met:  Proper associated with RPD/PD & O2 Sat Independence with exercise equipment Exercise tolerated well Personal goals reviewed Strength training completed today  Goals Unmet:  Not Applicable  Comments: Pt able to follow exercise prescription today without complaint.  Will continue to monitor for progression.    Dr. Emily Filbert is Medical Director for [place] and [facility].

## 2016-04-08 ENCOUNTER — Encounter: Payer: Medicare PPO | Admitting: *Deleted

## 2016-04-08 DIAGNOSIS — J449 Chronic obstructive pulmonary disease, unspecified: Secondary | ICD-10-CM

## 2016-04-08 NOTE — Progress Notes (Signed)
Daily Session Note  Patient Details  Name: Asucena Galer MRN: [id number] Date of Birth: 07-07-1945 Referring Provider:    Encounter Date: 04/08/2016  Check In:     Session Check In - 04/08/16 1141      Check-In   Location [facility]   Staff Present Alberteen Sam, MA, ACSM RCEP, Exercise Physiologist;Laureen Owens Shark, BS, RRT, Respiratory Therapist;Carroll Enterkin, RN, BSN   Supervising physician immediately available to respond to emergencies [facility] immediately available ER MD   Physician(s) Drs. Quentin Cornwall and [place]   Medication changes reported     No   Fall or balance concerns reported    No   Warm-up and Cool-down Performed as group-led Location manager Performed Yes   VAD Patient? No     Pain Assessment   Currently in Pain? No/denies   Multiple Pain Sites No         Goals Met:  Proper associated with RPD/PD & O2 Sat Independence with exercise equipment Using PLB without cueing & demonstrates good technique Exercise tolerated well Strength training completed today  Goals Unmet:  Not Applicable  Comments: Pt able to follow exercise prescription today without complaint.  Will continue to monitor for progression.    Dr. Emily Filbert is Medical Director for [place] and [facility].

## 2016-04-10 ENCOUNTER — Encounter: Payer: Medicare PPO | Admitting: *Deleted

## 2016-04-10 DIAGNOSIS — J449 Chronic obstructive pulmonary disease, unspecified: Secondary | ICD-10-CM | POA: Diagnosis not present

## 2016-04-10 NOTE — Progress Notes (Signed)
Daily Session Note  Patient Details  Name: Summer Bradley MRN: [id number] Date of Birth: [birth year] Referring Provider:    Encounter Date: 04/10/2016  Check In:     Session Check In - 04/10/16 1125      Check-In   Location [facility]   Staff Present Alberteen Sam, MA, ACSM RCEP, Exercise Physiologist;Carroll Enterkin, RN, Vickki Hearing, BA, ACSM CEP, Exercise Physiologist   Supervising physician immediately available to respond to emergencies [facility] immediately available ER MD   Physician(s) Drs. Joni Fears and [place]   Medication changes reported     No   Fall or balance concerns reported    No   Warm-up and Cool-down Performed as group-led Location manager Performed Yes   VAD Patient? No     Pain Assessment   Currently in Pain? No/denies   Multiple Pain Sites No         Goals Met:  Proper associated with RPD/PD & O2 Sat Using PLB without cueing & demonstrates good technique Exercise tolerated well Strength training completed today  Goals Unmet:  Not Applicable  Comments: Pt able to follow exercise prescription today without complaint.  Will continue to monitor for progression. 15 min on treadmill again today!!   Dr. Emily Filbert is Medical Director for [place] and [facility].

## 2016-04-13 DIAGNOSIS — J449 Chronic obstructive pulmonary disease, unspecified: Secondary | ICD-10-CM

## 2016-04-13 NOTE — Progress Notes (Signed)
Daily Session Note  Patient Details  Name: Summer Bradley MRN: [id number] Date of Birth: Aug 28, 1945 Referring Provider:    Encounter Date: 04/13/2016  Check In:     Session Check In - 04/13/16 1450      Check-In   Location [facility]   Staff Present Carson Myrtle, BS, RRT, Respiratory Therapist;Kelly Amedeo Plenty, BS, ACSM CEP, Exercise Physiologist;Amanda Oletta Darter, BA, ACSM CEP, Exercise Physiologist   Supervising physician immediately available to respond to emergencies [facility] immediately available ER MD   Physician(s) Jimmye Norman and Quentin Cornwall   Medication changes reported     No   Fall or balance concerns reported    No   Warm-up and Cool-down Performed as group-led instruction   Resistance Training Performed Yes   VAD Patient? No     Pain Assessment   Currently in Pain? No/denies   Multiple Pain Sites No         History  Smoking Status  . Former Smoker  Smokeless Tobacco  . Not on file    Comment: QUIT SMOKING "13 YEARS AGO"    Goals Met:  Proper associated with RPD/PD & O2 Sat Independence with exercise equipment Exercise tolerated well Strength training completed today  Goals Unmet:  Not Applicable  Comments: Pt able to follow exercise prescription today without complaint.  Will continue to monitor for progression.    Dr. Emily Filbert is Medical Director for [place] and [facility].

## 2016-04-15 ENCOUNTER — Encounter: Payer: Medicare PPO | Admitting: *Deleted

## 2016-04-15 DIAGNOSIS — J449 Chronic obstructive pulmonary disease, unspecified: Secondary | ICD-10-CM

## 2016-04-15 NOTE — Progress Notes (Signed)
Summer Bradley 71 y.o. female  30 day  Psychosocial Note  Patient psychosocial assessment reveals no barriers to participation in Pulmonary Rehab. There are no phsychosocial areas that are currently affecting patient's rehab experience.Patient does continue to exhibit positive coping skills to deal with her psychosocial concerns. Offered emotional support and reassurance. Patient does feel she is making progress toward Pulmonary Rehab goals. Patient reports her health and activity level has improved in the past 30 days as evidenced by patient's report of increased ability to to more around the house like make a peach cobbler. Patient states family/friends have noticed changes in her activity or mood. Patient reports feeling positive about current and projected progression in Pulmonary Rehab. After reviewing the patient's treatment plan, the patient is making progress toward Pulmonary Rehab goals. Patient's rate of progress toward rehab goals is good. Plan of action to help patient continue to work towards rehab goals include continuing to come to exercise and education classes. Will continue to monitor and evaluate progress toward psychosocial goal(s).  Goal(s) in progress:  Help patient work toward returning to meaningful activities that improve patient's QOL and are attainable with patient's lung disease

## 2016-04-15 NOTE — Progress Notes (Signed)
Daily Session Note  Patient Details  Name: Summer Bradley MRN: [id number] Date of Birth: [birth year] Referring Provider:    Encounter Date: 04/15/2016  Check In:     Session Check In - 04/15/16 1345      Check-In   Location [facility]   Staff Present Alberteen Sam, MA, ACSM RCEP, Exercise Physiologist;Other;Laureen Janell Quiet, RRT, Respiratory Therapist  Darel Hong, RN   Supervising physician immediately available to respond to emergencies [facility] immediately available ER MD   Physician(s) Drs. Jimmye Norman and [place]   Medication changes reported     No   Fall or balance concerns reported    No   Warm-up and Cool-down Performed as group-led Location manager Performed Yes   VAD Patient? No     Pain Assessment   Currently in Pain? No/denies   Multiple Pain Sites No           Exercise Prescription Changes - 04/14/16 1500      Response to Exercise   Blood Pressure (Admit) 122/70   Blood Pressure (Exercise) 130/80   Blood Pressure (Exit) 130/76   Heart Rate (Admit) 56 bpm   Heart Rate (Exercise) 75 bpm   Heart Rate (Exit) 55 bpm   Oxygen Saturation (Admit) 95 %   Oxygen Saturation (Exercise) 93 %   Oxygen Saturation (Exit) 97 %   Rating of Perceived Exertion (Exercise) 13   Perceived Dyspnea (Exercise) 1   Symptoms none   Comments Home Exercise Guidelines given 03/13/16   Duration Continue with 45 min of aerobic exercise without signs/symptoms of physical distress.   Intensity THRR unchanged     Progression   Progression Continue to progress workloads to maintain intensity without signs/symptoms of physical distress.   Average METs 1.7     Resistance Training   Training Prescription Yes   Weight 2 lbs   Reps 10-15     Interval Training   Interval Training No     Oxygen   Oxygen Continuous   Liters 2  3L on treadmill     Treadmill   MPH 0.8   Grade 0   Minutes 15  continuous   METs 1.7     NuStep   Level 4    Minutes 15   METs 2.3     REL-XR   Level 2   Minutes 15   METs 1.8     Home Exercise Plan   Plans to continue exercise at Home (comment)  walking and stationary bike   Frequency Add 1 additional day to program exercise sessions.   Initial Home Exercises Provided 03/13/16      History  Smoking Status  . Former Smoker  Smokeless Tobacco  . Not on file    Comment: QUIT SMOKING "13 YEARS AGO"    Goals Met:  Proper associated with RPD/PD & O2 Sat Independence with exercise equipment Using PLB without cueing & demonstrates good technique Exercise tolerated well Personal goals reviewed Strength training completed today  Goals Unmet:  Not Applicable  Comments: Pt able to follow exercise prescription today without complaint.  Will continue to monitor for progression.     [place] Name 02/18/16 1307 02/18/16 1313       6 Minute Walk   Distance 560 feet 560 feet    Walk Time 5.5 minutes 5.5 minutes    # of Rest Breaks 2 2    MPH 1.15 1.15    METS  1.84 1.84    RPE 13 13    Perceived Dyspnea  3 3    VO2 Peak 3.83 3.83    Symptoms No No    Resting HR 93 bpm 93 bpm    Resting BP 136/74 136/74    Max Ex. HR  - 122 bpm    Max Ex. BP  - 146/68      Interval HR   Baseline HR  - 100    1 Minute HR  - 93    2 Minute HR  - 108    3 Minute HR  - 108    4 Minute HR  - 122    5 Minute HR  - 117    6 Minute HR  - 100    Interval Heart Rate?  - Yes      Interval Oxygen   Interval Oxygen?  - Yes    Baseline Oxygen Saturation %  - 94 %    Baseline Liters of Oxygen  - 2 L    1 Minute Oxygen Saturation %  - 90 %    1 Minute Liters of Oxygen  - 2 L    2 Minute Oxygen Saturation %  - 90 %    2 Minute Liters of Oxygen  - 2 L    3 Minute Oxygen Saturation %  - 90 %    3 Minute Liters of Oxygen  - 2 L    4 Minute Oxygen Saturation %  - 89 %    4 Minute Liters of Oxygen  - 2 L    5 Minute Oxygen Saturation %  - 95 %    5 Minute Liters of Oxygen  - 2 L    6  Minute Oxygen Saturation %  - 86 %    6 Minute Liters of Oxygen  - 2 L    2 Minute Post Liters of Oxygen  - 2 L         Dr. Emily Filbert is Medical Director for [place] and [facility].

## 2016-04-17 ENCOUNTER — Encounter: Payer: Medicare PPO | Attending: Internal Medicine | Admitting: *Deleted

## 2016-04-17 DIAGNOSIS — F419 Anxiety disorder, unspecified: Secondary | ICD-10-CM | POA: Insufficient documentation

## 2016-04-17 DIAGNOSIS — I4891 Unspecified atrial fibrillation: Secondary | ICD-10-CM | POA: Diagnosis not present

## 2016-04-17 DIAGNOSIS — Z87891 Personal history of nicotine dependence: Secondary | ICD-10-CM | POA: Diagnosis not present

## 2016-04-17 DIAGNOSIS — J449 Chronic obstructive pulmonary disease, unspecified: Secondary | ICD-10-CM

## 2016-04-17 DIAGNOSIS — Z8673 Personal history of transient ischemic attack (TIA), and cerebral infarction without residual deficits: Secondary | ICD-10-CM | POA: Insufficient documentation

## 2016-04-17 DIAGNOSIS — E7800 Pure hypercholesterolemia, unspecified: Secondary | ICD-10-CM | POA: Insufficient documentation

## 2016-04-17 DIAGNOSIS — I10 Essential (primary) hypertension: Secondary | ICD-10-CM | POA: Insufficient documentation

## 2016-04-17 NOTE — Progress Notes (Signed)
Daily Session Note  Patient Details  Name: Summer Bradley MRN: [id number] Date of Birth: August 01, 1945 Referring Provider:    Encounter Date: 04/17/2016  Check In:     Session Check In - 04/17/16 1238      Check-In   Location [facility]   Staff Present Alberteen Sam, MA, ACSM RCEP, Exercise Physiologist;Patricia Surles RN BSN   Supervising physician immediately available to respond to emergencies [facility] immediately available ER MD   Physician(s) Dr. Reita Cliche and Clearnce Hasten   Medication changes reported     No   Fall or balance concerns reported    No   Warm-up and Cool-down Performed as group-led instruction   Resistance Training Performed Yes   VAD Patient? No     Pain Assessment   Currently in Pain? No/denies   Multiple Pain Sites No         History  Smoking Status  . Former Smoker  Smokeless Tobacco  . Not on file    Comment: QUIT SMOKING "13 YEARS AGO"    Goals Met:  Proper associated with RPD/PD & O2 Sat Independence with exercise equipment Using PLB without cueing & demonstrates good technique Exercise tolerated well Strength training completed today  Goals Unmet:  Not Applicable  Comments: Pt able to follow exercise prescription today without complaint.  Will continue to monitor for progression.    Dr. Emily Filbert is Medical Director for [place] and [facility].

## 2016-04-20 DIAGNOSIS — J449 Chronic obstructive pulmonary disease, unspecified: Secondary | ICD-10-CM | POA: Diagnosis not present

## 2016-04-20 NOTE — Progress Notes (Signed)
Daily Session Note  Patient Details  Name: Nicolina Hirt MRN: [id number] Date of Birth: [birth year] Referring Provider:    Encounter Date: 04/20/2016  Check In:     Session Check In - 04/20/16 1356      Check-In   Location [facility]   Staff Present Carson Myrtle, BS, RRT, Respiratory Therapist;Kelly Amedeo Plenty, BS, ACSM CEP, Exercise Physiologist;Fable Huisman Oletta Darter, BA, ACSM CEP, Exercise Physiologist   Supervising physician immediately available to respond to emergencies [facility] immediately available ER MD   Physician(s) Corky Downs and Jimmye Norman   Medication changes reported     No   Fall or balance concerns reported    No   Tobacco Cessation No Change   Warm-up and Cool-down Performed as group-led instruction   Resistance Training Performed Yes   VAD Patient? No     Pain Assessment   Currently in Pain? No/denies   Multiple Pain Sites No         History  Smoking Status  . Former Smoker  Smokeless Tobacco  . Not on file    Comment: QUIT SMOKING "13 YEARS AGO"    Goals Met:  Proper associated with RPD/PD & O2 Sat Independence with exercise equipment Exercise tolerated well Strength training completed today  Goals Unmet:  Not Applicable  Comments: Pt able to follow exercise prescription today without complaint.  Will continue to monitor for progression.    Dr. Emily Filbert is Medical Director for [place] and [facility].

## 2016-04-22 ENCOUNTER — Encounter: Payer: Medicare PPO | Admitting: *Deleted

## 2016-04-22 DIAGNOSIS — J449 Chronic obstructive pulmonary disease, unspecified: Secondary | ICD-10-CM | POA: Diagnosis not present

## 2016-04-22 NOTE — Progress Notes (Signed)
Daily Session Note  Patient Details  Name: Summer Bradley MRN: [id number] Date of Birth: Dec 25, 1945 Referring Provider:    Encounter Date: 04/22/2016  Check In:     Session Check In - 04/22/16 1124      Check-In   Location [facility]   Staff Present Alberteen Sam, MA, ACSM RCEP, Exercise Physiologist;Antia Rahal RN BSN;Laureen Owens Shark, BS, RRT, Respiratory Therapist   Supervising physician immediately available to respond to emergencies [facility] immediately available ER MD   Physician(s) Drs. 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 No     Pain Assessment   Currently in Pain? No/denies   Multiple Pain Sites No         History  Smoking Status  . Former Smoker  Smokeless Tobacco  . Not on file    Comment: QUIT SMOKING "13 YEARS AGO"    Goals Met:  Proper associated with RPD/PD & O2 Sat Independence with exercise equipment Exercise tolerated well Queuing for purse lip breathing No report of cardiac concerns or symptoms Strength training completed today  Goals Unmet:  Not Applicable  Comments: Pt able to follow exercise prescription today without complaint.  Will continue to monitor for progression.    Dr. Emily Filbert is Medical Director for [place] and [facility].

## 2016-04-24 ENCOUNTER — Encounter: Payer: Medicare PPO | Admitting: *Deleted

## 2016-04-24 DIAGNOSIS — J449 Chronic obstructive pulmonary disease, unspecified: Secondary | ICD-10-CM | POA: Diagnosis not present

## 2016-04-24 NOTE — Progress Notes (Signed)
Daily Session Note  Patient Details  Name: Summer Bradley MRN: [id number] Date of Birth: [birth year] Referring Provider:    Encounter Date: 04/24/2016  Check In:     Session Check In - 04/24/16 1210      Check-In   Location [facility]   Staff Present Alberteen Sam, MA, ACSM RCEP, Exercise Physiologist;Other  Darel Hong, RN BSN   Supervising physician immediately available to respond to emergencies [facility] immediately available ER MD   Physician(s) Drs. Quentin Cornwall and [employer]   Medication changes reported     No   Fall or balance concerns reported    No   Warm-up and Cool-down Performed as group-led Location manager Performed Yes   VAD Patient? No     Pain Assessment   Currently in Pain? No/denies   Multiple Pain Sites No         History  Smoking Status  . Former Smoker  Smokeless Tobacco  . Not on file    Comment: QUIT SMOKING "13 YEARS AGO"    Goals Met:  Proper associated with RPD/PD & O2 Sat Independence with exercise equipment Using PLB without cueing & demonstrates good technique Exercise tolerated well Strength training completed today  Goals Unmet:  Not Applicable  Comments: Pt able to follow exercise prescription today without complaint.  Will continue to monitor for progression.    Dr. Emily Filbert is Medical Director for [facility] and [facility].

## 2016-04-27 ENCOUNTER — Encounter: Payer: Self-pay | Admitting: *Deleted

## 2016-04-27 DIAGNOSIS — J449 Chronic obstructive pulmonary disease, unspecified: Secondary | ICD-10-CM

## 2016-04-27 NOTE — Progress Notes (Signed)
Pulmonary Individual Treatment Plan  Patient Details  Name: Summer Bradley MRN: [id number] Date of Birth: Sep 22, 1945 Referring Provider:    Initial Encounter Date:  Flowsheet Row Pulmonary Rehab from 02/18/2016 in [facility] Cardiac and Pulmonary Rehab  Date  02/18/16      Visit Diagnosis: COPD, moderate ([place])  Patient's Home Medications on Admission:  Current Outpatient Prescriptions:  .  acetaminophen (TYLENOL) 325 MG tablet, Take 650 mg by mouth every 6 (six) hours as needed (TAKE 2 TABLETS EVERY 6 HOURS AS NEEDED)., Disp: , Rfl:  .  albuterol (PROVENTIL HFA;VENTOLIN HFA) 108 (90 BASE) MCG/ACT inhaler, Inhale 1 puff into the lungs every 6 (six) hours as needed for wheezing or shortness of breath., Disp: , Rfl:  .  apixaban (ELIQUIS) 5 MG TABS tablet, Take 1 tablet (5 mg total) by mouth 2 (two) times daily., Disp: 60 tablet, Rfl: 5 .  bisoprolol (ZEBETA) 5 MG tablet, Take 0.5 tablets (2.5 mg total) by mouth 2 (two) times daily., Disp: 30 tablet, Rfl: 5 .  budesonide-formoterol (SYMBICORT) 160-4.5 MCG/ACT inhaler, Inhale 2 puffs into the lungs 2 (two) times daily., Disp: , Rfl:  .  Calcium Carbonate-Vitamin D 600-400 MG-UNIT per tablet, Take 1 tablet by mouth daily., Disp: , Rfl:  .  citalopram (CELEXA) 40 MG tablet, , Disp: , Rfl:  .  diltiazem (DILACOR XR) 240 MG 24 hr capsule, , Disp: , Rfl:  .  fluticasone (FLONASE) 50 MCG/ACT nasal spray, Place 1 spray into both nostrils daily as needed for allergies. , Disp: , Rfl:  .  furosemide (LASIX) 20 MG tablet, Take 1 tablet (20 mg total) by mouth 2 (two) times daily., Disp: 30 tablet, Rfl: 5 .  ipratropium (ATROVENT HFA) 17 MCG/ACT inhaler, Inhale 2 puffs into the lungs 4 (four) times daily., Disp: , Rfl:  .  levalbuterol (XOPENEX) 0.63 MG/3ML nebulizer solution, Take 3 mLs (0.63 mg total) by nebulization every 6 (six) hours as needed for wheezing or shortness of breath., Disp: 3 mL, Rfl: 12 .  levothyroxine (SYNTHROID, LEVOTHROID) 150 MCG tablet,  Take 150 mcg by mouth daily before breakfast. , Disp: , Rfl:  .  Multiple Vitamin (MULTIVITAMIN WITH MINERALS) TABS tablet, Take 1 tablet by mouth daily., Disp: , Rfl:  .  potassium chloride SA (K-DUR,KLOR-CON) 20 MEQ tablet, Take 2 tablets (40 mEq total) by mouth 2 (two) times daily., Disp: 30 tablet, Rfl: 0  Past Medical History: Past Medical History:  Diagnosis Date  . A-fib ([place] [facility])   . Anxiety   . COPD (chronic obstructive pulmonary disease) ([place])   . Heart disease   . High cholesterol   . Hypertension   . Stroke [facility])     Tobacco Use: History  Smoking Status  . Former Smoker  Smokeless Tobacco  . Not on file    Comment: QUIT SMOKING "13 YEARS AGO"    Labs: Recent Review Flowsheet Data    Labs for ITP Cardiac and Pulmonary Rehab Latest Ref Rng & Units 01/29/2014 01/29/2014 01/30/2014 02/01/2014 02/13/2014   Cholestrol 0 - 200 mg/dL - - 114 - -   LDLCALC 0 - 99 mg/dL - - 70 - -   HDL >39 mg/dL - - 24(L) - -   Trlycerides <150 mg/dL 118 - 102 - -   Hemoglobin A1c <5.7 % - - 6.4(H) - -   PHART 7.350 - 7.450 - 7.282(L) - 7.399 7.297(L)   PCO2ART 35.0 - 45.0 mmHg - 54.5(H) - 42.3 56.0(H)   HCO3 20.0 -  24.0 mEq/L - 24.9(H) - 25.6(H) 27.5(H)   TCO2 0 - 100 mmol/L - 26.6 - 26.9 29   ACIDBASEDEF 0.0 - 2.0 mmol/L - 0.9 - - -   O2SAT % - 91.4 - 97.2 94.0       ADL UCSD:     Pulmonary Assessment Scores    Row Name 02/18/16 1323 04/15/16 1408       ADL UCSD   ADL Phase Entry Mid    SOB Score total 89 57    Rest 2 0    Walk 5 3    Stairs 5 5    Bath 5 3    Dress 5 3    Shop 5 3      mMRC Score   mMRC Score  - 3  entry 4 mMRC       Pulmonary Function Assessment:     Pulmonary Function Assessment - 02/18/16 1322      Breath   Shortness of Breath Fear of Shortness of Breath;Limiting activity;Yes      Exercise Target Goals:    Exercise Program Goal: Individual exercise prescription set with THRR, safety & activity barriers. Participant demonstrates  ability to understand and report RPE using BORG scale, to self-measure pulse accurately, and to acknowledge the importance of the exercise prescription.  Exercise Prescription Goal: Starting with aerobic activity 30 plus minutes a day, 3 days per week for initial exercise prescription. Provide home exercise prescription and guidelines that participant acknowledges understanding prior to discharge.  Activity Barriers & Risk Stratification:     Activity Barriers & Cardiac Risk Stratification - 02/18/16 1329      Activity Barriers & Cardiac Risk Stratification   Activity Barriers --  History of a stroke. Left side numbness and tingling when use arm.      6 Minute Walk:     6 Minute Walk    Row Name 02/18/16 1307 02/18/16 1313       6 Minute Walk   Distance 560 feet 560 feet    Walk Time 5.5 minutes 5.5 minutes    # of Rest Breaks 2 2    MPH 1.15 1.15    METS 1.84 1.84    RPE 13 13    Perceived Dyspnea  3 3    VO2 Peak 3.83 3.83    Symptoms No No    Resting HR 93 bpm 93 bpm    Resting BP 136/74 136/74    Max Ex. HR  - 122 bpm    Max Ex. BP  - 146/68      Interval HR   Baseline HR  - 100    1 Minute HR  - 93    2 Minute HR  - 108    3 Minute HR  - 108    4 Minute HR  - 122    5 Minute HR  - 117    6 Minute HR  - 100    Interval Heart Rate?  - Yes      Interval Oxygen   Interval Oxygen?  - Yes    Baseline Oxygen Saturation %  - 94 %    Baseline Liters of Oxygen  - 2 L    1 Minute Oxygen Saturation %  - 90 %    1 Minute Liters of Oxygen  - 2 L    2 Minute Oxygen Saturation %  - 90 %    2 Minute Liters of Oxygen  - 2 L  3 Minute Oxygen Saturation %  - 90 %    3 Minute Liters of Oxygen  - 2 L    4 Minute Oxygen Saturation %  - 89 %    4 Minute Liters of Oxygen  - 2 L    5 Minute Oxygen Saturation %  - 95 %    5 Minute Liters of Oxygen  - 2 L    6 Minute Oxygen Saturation %  - 86 %    6 Minute Liters of Oxygen  - 2 L    2 Minute Post Liters of Oxygen  - 2 L       Oxygen Initial Assessment:     Oxygen Initial Assessment - 04/27/16 0803      Home Oxygen   Home Oxygen Device Portable Concentrator;Home Concentrator   Sleep Oxygen Prescription Continuous   Liters per minute 2   Home Exercise Oxygen Prescription Continuous   Liters per minute 2   Home at Rest Exercise Oxygen Prescription Continuous   Liters per minute 2   Compliance with Home Oxygen Use Yes     Initial 6 min Walk   Oxygen Used Continuous   Liters per minute 2   Resting Oxygen Saturation  during 6 min walk 94 %   Exercise Oxygen Saturation  during 6 min walk 89 %     Program Oxygen Prescription   Program Oxygen Prescription Continuous   Liters per minute 2     Intervention   Short Term Goals To learn and exhibit compliance with exercise, home and travel O2 prescription;To Learn and understand importance of maintaining oxygen saturations>88%;To learn and demonstrate proper use of respiratory medications;To learn and understand importance of monitoring SPO2 with pulse oximeter and demonstrate accurate use of the pulse oximeter.;To learn and demonstrate proper purse lipped breathing techniques or other breathing techniques.   Long  Term Goals Exhibits compliance with exercise, home and travel O2 prescription;Maintenance of O2 saturations>88%;Compliance with respiratory medication;Verbalizes importance of monitoring SPO2 with pulse oximeter and return demonstration;Exhibits proper breathing techniques, such as purse lipped breathing or other method taught during program session;Demonstrates proper use of MDI's      Oxygen Re-Evaluation:   Oxygen Discharge (Final Oxygen Re-Evaluation):   Initial Exercise Prescription:     Initial Exercise Prescription - 02/18/16 1300      Date of Initial Exercise RX and Referring Provider   Date 02/18/16     Oxygen   Oxygen Continuous   Liters 2     NuStep   Level 1   Minutes 15   METs 1.6     REL-XR   Level 1   Minutes 15    METs 1.6     Track   Laps 15   Minutes 15     Prescription Details   Frequency (times per week) 3   Duration Progress to 45 minutes of aerobic exercise without signs/symptoms of physical distress     Intensity   THRR 40-80% of Max Heartrate 115-137   Ratings of Perceived Exertion 11-13     Progression   Progression Continue to progress workloads to maintain intensity without signs/symptoms of physical distress.     Resistance Training   Training Prescription Yes   Weight 1   Reps 10-15      Perform Capillary Blood Glucose checks as needed.  Exercise Prescription Changes:     Exercise Prescription Changes    Row Name 02/24/16 1200 03/13/16 1300 03/18/16 1500 04/01/16 1500 04/14/16 1500  Response to Exercise   Blood Pressure (Admit) 126/74  - 128/62 122/64 122/70   Blood Pressure (Exercise) 132/76  - 130/74 136/72 130/80   Blood Pressure (Exit) 122/84  - 112/64 112/60 130/76   Heart Rate (Admit) 95 bpm  - 56 bpm 63 bpm 56 bpm   Heart Rate (Exercise) 108 bpm  - 71 bpm 70 bpm 75 bpm   Heart Rate (Exit) 105 bpm  - 56 bpm 86 bpm 55 bpm   Oxygen Saturation (Admit) 94 %  - 96 % 94 % 95 %   Oxygen Saturation (Exercise) 88 %  - 92 % 96 % 93 %   Oxygen Saturation (Exit) 96 %  - 95 % 98 % 97 %   Rating of Perceived Exertion (Exercise) 13  - 17 13 13    Perceived Dyspnea (Exercise) 2  - 2 1 1    Symptoms  - none none none none   Comments  - Home Exercise Guidelines given 03/13/16 Home Exercise Guidelines given 03/13/16 Home Exercise Guidelines given 03/13/16 Home Exercise Guidelines given 03/13/16   Duration Progress to 45 minutes of aerobic exercise without signs/symptoms of physical distress Progress to 45 minutes of aerobic exercise without signs/symptoms of physical distress Progress to 45 minutes of aerobic exercise without signs/symptoms of physical distress Progress to 45 minutes of aerobic exercise without signs/symptoms of physical distress Continue with 45 min of aerobic  exercise without signs/symptoms of physical distress.   Intensity THRR unchanged THRR unchanged THRR unchanged THRR unchanged THRR unchanged     Progression   Progression Continue to progress workloads to maintain intensity without signs/symptoms of physical distress. Continue to progress workloads to maintain intensity without signs/symptoms of physical distress. Continue to progress workloads to maintain intensity without signs/symptoms of physical distress. Continue to progress workloads to maintain intensity without signs/symptoms of physical distress. Continue to progress workloads to maintain intensity without signs/symptoms of physical distress.   Average METs  -  - 1.9 1.93 1.7     Resistance Training   Training Prescription Yes Yes Yes Yes Yes   Weight 1 1 yellow band 2 lbs 2 lbs   Reps  -  - 10-15 10-15 10-15     Interval Training   Interval Training  -  - No No No     Oxygen   Oxygen Continuous Continuous Continuous Continuous Continuous   Liters 2 2 2 2 2   3L on treadmill     Treadmill   MPH  -  -  - 0.9 0.8   Grade  -  -  - 0 0   Minutes  -  -  - 15 15  continuous   METs  -  -  - 1.7 1.7     NuStep   Level 1 1 1 4 4    Minutes 15 15 15 15 15    METs 1.8 1.8 2  77 spm 2.3  71 spm 2.3     REL-XR   Level  -  - 1 2 2    Minutes  -  - 15 15 15    METs  -  - 1.9  38 spm 1.8 1.8     Biostep-RELP   Level 1 1  -  -  -   Minutes 15 15  -  -  -   METs 2 2  -  -  -     Track   Minutes  -  - 5  -  -  Home Exercise Plan   Plans to continue exercise at  - Home  walking and stationary bike Home  walking and stationary bike Home  walking and stationary bike Home (comment)  walking and stationary bike   Frequency  - Add 1 additional day to program exercise sessions. Add 1 additional day to program exercise sessions. Add 1 additional day to program exercise sessions. Add 1 additional day to program exercise sessions.   Initial Home Exercises Provided  -  -  -  -  03/13/16     Exercise Review   Progression  -  - Yes Yes  -      Exercise Comments:     Exercise Comments    Row Name 02/24/16 1257 03/13/16 1331 03/18/16 1515 03/25/16 1318 04/01/16 1537   Exercise Comments  First full day of exercise!  Patient was oriented to gym and equipment including functions, settings, policies, and procedures.  Patient's individual exercise prescription and treatment plan were reviewed.  All starting workloads were established based on the results of the 6 minute walk test done at initial orientation visit.  The plan for exercise progression was also introduced and progression will be customized based on patient's performance and goals. Reviewed home exercise with pt today.  Pt plans to walk and use stationary bike at home for exercise.  Reviewed THR, pulse, RPE, sign and symptoms, and when to call 911 or MD.  Also discussed weather considerations and indoor options.  Pt voiced understanding. Carilyn is off to a good start with exercise.  She needs lots of encouragement to keep moving.  She is able to do 15 min on each machine, but does need rest when walking.  She often gets side tracked and will stop exercisng to talk.  We will continue to monitor her progression and keep her moving. Shanae went 7 min on the treadmill today!! Pasty has been doing much better in rehab.  She is doing better following along during weights! Today she was able to do 10 min on the treadmill total!  We will continue to monitor her progression.   Early Name 04/10/16 1330           Exercise Comments 15 min on treadmill again today!!          Exercise Goals and Review:   Exercise Goals Re-Evaluation :     Exercise Goals Re-Evaluation    Row Name 04/14/16 1548 04/15/16 1403 04/17/16 1238         Exercise Goal Re-Evaluation   Exercise Goals Review Increase Physical Activity;Increase Strenth and Stamina Increase Physical Activity;Increase Strenth and Stamina Increase Strenth and  Stamina;Increase Physical Activity     Comments Dexter is coming up on her half way point.  She is up to 15 minutes on the treadmill now!  Her stamina is really improving.  She also getting through class by herself now, no longer needing her daughter or as much help from the staff.  We will continue to monitor her progression. Cuma improved her walk test by 125 ft!! Shelisha was able to change the sheets on her bed yesterday!!     Expected Outcomes Short: Krizia will start to increase her speed on treadmill.  Long: Continue to come to classes to work on Microbiologist and Long: Keep coming to class to work on strength and stamina to become more independent Short and Long: Keep coming to class to work on strength and stamina to become more independent  Discharge Exercise Prescription (Final Exercise Prescription Changes):     Exercise Prescription Changes - 04/14/16 1500      Response to Exercise   Blood Pressure (Admit) 122/70   Blood Pressure (Exercise) 130/80   Blood Pressure (Exit) 130/76   Heart Rate (Admit) 56 bpm   Heart Rate (Exercise) 75 bpm   Heart Rate (Exit) 55 bpm   Oxygen Saturation (Admit) 95 %   Oxygen Saturation (Exercise) 93 %   Oxygen Saturation (Exit) 97 %   Rating of Perceived Exertion (Exercise) 13   Perceived Dyspnea (Exercise) 1   Symptoms none   Comments Home Exercise Guidelines given 03/13/16   Duration Continue with 45 min of aerobic exercise without signs/symptoms of physical distress.   Intensity THRR unchanged     Progression   Progression Continue to progress workloads to maintain intensity without signs/symptoms of physical distress.   Average METs 1.7     Resistance Training   Training Prescription Yes   Weight 2 lbs   Reps 10-15     Interval Training   Interval Training No     Oxygen   Oxygen Continuous   Liters 2  3L on treadmill     Treadmill   MPH 0.8   Grade 0   Minutes 15  continuous   METs 1.7      NuStep   Level 4   Minutes 15   METs 2.3     REL-XR   Level 2   Minutes 15   METs 1.8     Home Exercise Plan   Plans to continue exercise at Home (comment)  walking and stationary bike   Frequency Add 1 additional day to program exercise sessions.   Initial Home Exercises Provided 03/13/16      Nutrition:  Target Goals: Understanding of nutrition guidelines, daily intake of sodium 1500mg , cholesterol 200mg , calories 30% from fat and 7% or less from saturated fats, daily to have 5 or more servings of fruits and vegetables.  Biometrics:     Pre Biometrics - 02/18/16 1306      Pre Biometrics   Height 5\' 2"  (1.575 m)   Weight 193 lb 14.4 oz (88 kg)   Waist Circumference 44.5 inches   Hip Circumference 43.5 inches   Waist to Hip Ratio 1.02 %   BMI (Calculated) 35.5       Nutrition Therapy Plan and Nutrition Goals:     Nutrition Therapy & Goals - 02/18/16 1311      Intervention Plan   Intervention Prescribe, educate and counsel regarding individualized specific dietary modifications aiming towards targeted core components such as weight, hypertension, lipid management, diabetes, heart failure and other comorbidities.   Expected Outcomes Short Term Goal: A plan has been developed with personal nutrition goals set during dietitian appointment.      Nutrition Discharge: Rate Your Plate Scores:   Nutrition Goals Re-Evaluation:     Nutrition Goals Re-Evaluation    Row Name 03/20/16 1322             Goals   Current Weight 193 lb (87.5 kg)         Intervention Plan   Intervention Continue to educate, counsel and set short/long term goals regarding individualized specific personal dietary modifications.       Comments Ms. Behrman does not state any personal nutrition goals at this time.  She states her appetite is normal and that her neighbor brings her meals often.  She is willing to meet  with the dietician in an effort to help meet her weight management goal.  But  appointment should be made with her daughter.            Nutrition Goals Discharge (Final Nutrition Goals Re-Evaluation):     Nutrition Goals Re-Evaluation - 03/20/16 1322      Goals   Current Weight 193 lb (87.5 kg)     Intervention Plan   Intervention Continue to educate, counsel and set short/long term goals regarding individualized specific personal dietary modifications.   Comments Ms. Doane does not state any personal nutrition goals at this time.  She states her appetite is normal and that her neighbor brings her meals often.  She is willing to meet with the dietician in an effort to help meet her weight management goal.  But appointment should be made with her daughter.        Psychosocial: Target Goals: Acknowledge presence or absence of significant depression and/or stress, maximize coping skills, provide positive support system. Participant is able to verbalize types and ability to use techniques and skills needed for reducing stress and depression.   Initial Review & Psychosocial Screening:     Initial Psych Review & Screening - 02/18/16 1314      Initial Review   Current issues with Current Psychotropic Meds     Family Dynamics   Good Support System? Yes  Daughter and other children     Barriers   Psychosocial barriers to participate in program There are no identifiable barriers or psychosocial needs.;The patient should benefit from training in stress management and relaxation.     Screening Interventions   Interventions Encouraged to exercise      Quality of Life Scores:     Quality of Life - 02/18/16 1315      Quality of Life Scores   Health/Function Pre 21 %   Socioeconomic Pre 18.33 %   Psych/Spiritual Pre 21 %   Family Pre 21 %   GLOBAL Pre 20.5 %      PHQ-9: Recent Review Flowsheet Data    Depression screen [facility] 2/9 02/18/2016   Decreased Interest 2   Down, Depressed, Hopeless 1   PHQ - 2 Score 3   Altered sleeping 2   Tired, decreased  energy 3   Change in appetite 3   Feeling bad or failure about yourself  3   Trouble concentrating 3   Moving slowly or fidgety/restless 3   Suicidal thoughts 0   PHQ-9 Score 20   Difficult doing work/chores Extremely dIfficult     Interpretation of Total Score  Total Score Depression Severity:  1-4 = Minimal depression, 5-9 = Mild depression, 10-14 = Moderate depression, 15-19 = Moderately severe depression, 20-27 = Severe depression   Psychosocial Evaluation and Intervention:   Psychosocial Re-Evaluation:     Psychosocial Re-Evaluation    [place] Name 02/28/16 1027 03/20/16 1324 04/15/16 1401         Psychosocial Re-Evaluation   Current issues with  -  - None Identified     Comments Bethzaida called and said she is sorry that she can't make [facility] today since she is sick.  Alita denies extra stress in her life right now and denies any difficulty sleeping.  She does endorse some anxiety, over "small everyday things" like when her clothes get twisted up while dressing or when she gets tangled in her O2 tubing.  We discussed using some of the relaxation and deep breathing techniques she  is learning in [place].  She lives alone has wonderful support from her 2 daughters who help her keep up with her medications and medical appointments.  A neighbor brings her meals often.   Patient psychosocial assessment reveals no barriers to participation in Pulmonary Rehab. There are no phsychosocial areas that are currently affecting patient's rehab experience.Patient does continue to exhibit positive coping skills to deal with her psychosocial concerns. Offered emotional support and reassurance. Patient does feel she is making progress toward Pulmonary Rehab goals. Patient reports her health and activity level has improved in the past 30 days as evidenced by patient's report of increased ability to to more around the house like make a peach cobbler. Patient states family/friends have noticed  changes in her activity or mood. Patient reports feeling positive about current and projected progression in Pulmonary Rehab. After reviewing the patient's treatment plan, the patient is making progress toward Pulmonary Rehab goals. Patient's rate of progress toward rehab goals is good. Plan of action to help patient continue to work towards rehab goals include continuing to come to exercise and education classes. Will continue to monitor and evaluate progress toward psychosocial goal(s).     Expected Outcomes  -  - Short: Continue to come to exercise and education classes. Long: Able to do more at home on her own.     Interventions  - Encouraged to attend Pulmonary Rehabilitation for the exercise;Relaxation education Encouraged to attend Pulmonary Rehabilitation for the exercise;Relaxation education     Continue Psychosocial Services   - Yes Follow up required by staff        Psychosocial Discharge (Final Psychosocial Re-Evaluation):     Psychosocial Re-Evaluation - 04/15/16 1401      Psychosocial Re-Evaluation   Current issues with None Identified   Comments Patient psychosocial assessment reveals no barriers to participation in Pulmonary Rehab. There are no phsychosocial areas that are currently affecting patient's rehab experience.Patient does continue to exhibit positive coping skills to deal with her psychosocial concerns. Offered emotional support and reassurance. Patient does feel she is making progress toward Pulmonary Rehab goals. Patient reports her health and activity level has improved in the past 30 days as evidenced by patient's report of increased ability to to more around the house like make a peach cobbler. Patient states family/friends have noticed changes in her activity or mood. Patient reports feeling positive about current and projected progression in Pulmonary Rehab. After reviewing the patient's treatment plan, the patient is making progress toward Pulmonary Rehab goals.  Patient's rate of progress toward rehab goals is good. Plan of action to help patient continue to work towards rehab goals include continuing to come to exercise and education classes. Will continue to monitor and evaluate progress toward psychosocial goal(s).   Expected Outcomes Short: Continue to come to exercise and education classes. Long: Able to do more at home on her own.   Interventions Encouraged to attend Pulmonary Rehabilitation for the exercise;Relaxation education   Continue Psychosocial Services  Follow up required by staff      Education: Education Goals: Education classes will be provided on a weekly basis, covering required topics. Participant will state understanding/return demonstration of topics presented.  Learning Barriers/Preferences:     Learning Barriers/Preferences - 02/18/16 1321      Learning Barriers/Preferences   Learning Barriers None   Learning Preferences None      Education Topics: Initial Evaluation Education: - Verbal, written and demonstration of respiratory meds, RPE/PD scales, oximetry and breathing techniques. Instruction on  use of nebulizers and MDIs: cleaning and proper use, rinsing mouth with steroid doses and importance of monitoring MDI activations. Flowsheet Row Pulmonary Rehab from 04/22/2016 in [facility] Cardiac and Pulmonary Rehab  Date  02/18/16  Educator  Sb  Instruction Review Code  2- meets goals/outcomes      General Nutrition Guidelines/Fats and Fiber: -Group instruction provided by verbal, written material, models and posters to present the general guidelines for heart healthy nutrition. Gives an explanation and review of dietary fats and fiber. Flowsheet Row Pulmonary Rehab from 04/22/2016 in [facility] Cardiac and Pulmonary Rehab  Date  04/20/16  Educator  CR  Instruction Review Code  2- meets goals/outcomes      Controlling Sodium/Reading Food Labels: -Group verbal and written material supporting the discussion of sodium use in  heart healthy nutrition. Review and explanation with models, verbal and written materials for utilization of the food label. Flowsheet Row Pulmonary Rehab from 04/22/2016 in [facility] Cardiac and Pulmonary Rehab  Date  03/09/16  Educator  CR  Instruction Review Code  2- meets goals/outcomes      Exercise Physiology & Risk Factors: - Group verbal and written instruction with models to review the exercise physiology of the cardiovascular system and associated critical values. Details cardiovascular disease risk factors and the goals associated with each risk factor.   Aerobic Exercise & Resistance Training: - Gives group verbal and written discussion on the health impact of inactivity. On the components of aerobic and resistive training programs and the benefits of this training and how to safely progress through these programs.   Flexibility, Balance, General Exercise Guidelines: - Provides group verbal and written instruction on the benefits of flexibility and balance training programs. Provides general exercise guidelines with specific guidelines to those with heart or lung disease. Demonstration and skill practice provided. Flowsheet Row Pulmonary Rehab from 04/22/2016 in [facility] Cardiac and Pulmonary Rehab  Date  04/10/16  Educator  AS  Instruction Review Code  2- meets goals/outcomes      Stress Management: - Provides group verbal and written instruction about the health risks of elevated stress, cause of high stress, and healthy ways to reduce stress.   Depression: - Provides group verbal and written instruction on the correlation between heart/lung disease and depressed mood, treatment options, and the stigmas associated with seeking treatment. Flowsheet Row Pulmonary Rehab from 04/22/2016 in [facility] Cardiac and Pulmonary Rehab  Date  04/22/16  Educator  [facility]  Instruction Review Code  2- meets goals/outcomes      Exercise & Equipment Safety: - Individual verbal instruction and demonstration  of equipment use and safety with use of the equipment. Flowsheet Row Pulmonary Rehab from 04/22/2016 in [facility] Cardiac and Pulmonary Rehab  Date  02/24/16  Educator  AS  Instruction Review Code  2- meets goals/outcomes      Infection Prevention: - Provides verbal and written material to individual with discussion of infection control including proper hand washing and proper equipment cleaning during exercise session. Flowsheet Row Pulmonary Rehab from 04/22/2016 in [facility] Cardiac and Pulmonary Rehab  Date  02/24/16  Educator  AS  Instruction Review Code  2- meets goals/outcomes      Falls Prevention: - Provides verbal and written material to individual with discussion of falls prevention and safety. Flowsheet Row Pulmonary Rehab from 04/22/2016 in [facility] Cardiac and Pulmonary Rehab  Date  02/18/16  Educator  SB  Instruction Review Code  2- meets goals/outcomes      Diabetes: - Individual verbal and  written instruction to review signs/symptoms of diabetes, desired ranges of glucose level fasting, after meals and with exercise. Advice that pre and post exercise glucose checks will be done for 3 sessions at entry of program.   Chronic Lung Diseases: - Group verbal and written instruction to review new updates, new respiratory medications, new advancements in procedures and treatments. Provide informative websites and "800" numbers of self-education. Flowsheet Row Pulmonary Rehab from 04/22/2016 in [facility] Cardiac and Pulmonary Rehab  Date  03/11/16  Educator  LB  Instruction Review Code  2- meets goals/outcomes      Lung Procedures: - Group verbal and written instruction to describe testing methods done to diagnose lung disease. Review the outcome of test results. Describe the treatment choices: Pulmonary Function Tests, ABGs and oximetry.   Energy Conservation: - Provide group verbal and written instruction for methods to conserve energy, plan and organize activities. Instruct on pacing  techniques, use of adaptive equipment and posture/positioning to relieve shortness of breath. Flowsheet Row Pulmonary Rehab from 04/22/2016 in [facility] Cardiac and Pulmonary Rehab  Date  04/15/16  Educator  [facility]  Instruction Review Code  2- meets goals/outcomes      Triggers: - Group verbal and written instruction to review types of environmental controls: home humidity, furnaces, filters, dust mite/pet prevention, HEPA vacuums. To discuss weather changes, air quality and the benefits of nasal washing. Flowsheet Row Pulmonary Rehab from 04/22/2016 in [facility] Cardiac and Pulmonary Rehab  Date  04/08/16  Educator  LB  Instruction Review Code  2- meets goals/outcomes      Exacerbations: - Group verbal and written instruction to provide: warning signs, infection symptoms, calling MD promptly, preventive modes, and value of vaccinations. Review: effective airway clearance, coughing and/or vibration techniques. Create an Sports administrator. Flowsheet Row Pulmonary Rehab from 04/22/2016 in [facility] Cardiac and Pulmonary Rehab  Date  04/01/16  Educator  LB  Instruction Review Code  2- meets goals/outcomes      Oxygen: - Individual and group verbal and written instruction on oxygen therapy. Includes supplement oxygen, available portable oxygen systems, continuous and intermittent flow rates, oxygen safety, concentrators, and Medicare reimbursement for oxygen.   Respiratory Medications: - Group verbal and written instruction to review medications for lung disease. Drug class, frequency, complications, importance of spacers, rinsing mouth after steroid MDI's, and proper cleaning methods for nebulizers.   AED/CPR: - Group verbal and written instruction with the use of models to demonstrate the basic use of the AED with the basic ABC's of resuscitation.   Breathing Retraining: - Provides individuals verbal and written instruction on purpose, frequency, and proper technique of diaphragmatic breathing and  pursed-lipped breathing. Applies individual practice skills. Flowsheet Row Pulmonary Rehab from 04/22/2016 in [facility] Cardiac and Pulmonary Rehab  Date  02/24/16  Educator  AS  Instruction Review Code  2- meets goals/outcomes      Anatomy and Physiology of the Lungs: - Group verbal and written instruction with the use of models to provide basic lung anatomy and physiology related to function, structure and complications of lung disease.   Heart Failure: - Group verbal and written instruction on the basics of heart failure: signs/symptoms, treatments, explanation of ejection fraction, enlarged heart and cardiomyopathy. Flowsheet Row Pulmonary Rehab from 04/22/2016 in [facility] Cardiac and Pulmonary Rehab  Date  04/03/16  Educator  CE  Instruction Review Code  2- meets goals/outcomes      Sleep Apnea: - Individual verbal and written instruction to review Obstructive Sleep Apnea. Review of risk  factors, methods for diagnosing and types of masks and machines for OSA.   Anxiety: - Provides group, verbal and written instruction on the correlation between heart/lung disease and anxiety, treatment options, and management of anxiety.   Relaxation: - Provides group, verbal and written instruction about the benefits of relaxation for patients with heart/lung disease. Also provides patients with examples of relaxation techniques. Flowsheet Row Pulmonary Rehab from 04/22/2016 in [facility] Cardiac and Pulmonary Rehab  Date  03/25/16  Educator  [facility]  Instruction Review Code  2- Meets goals/outcomes      Knowledge Questionnaire Score:     Knowledge Questionnaire Score - 02/18/16 1322      Knowledge Questionnaire Score   Pre Score 1/10       Core Components/Risk Factors/Patient Goals at Admission:     Personal Goals and Risk Factors at Admission - 02/18/16 1311      Core Components/Risk Factors/Patient Goals on Admission    Weight Management Obesity;Weight Maintenance;Yes   Admit Weight 193 lb 14.4  oz (88 kg)   Goal Weight: Short Term 190 lb (86.2 kg)   Goal Weight: Long Term 160 lb (72.6 kg)   Expected Outcomes Short Term: Continue to assess and modify interventions until short term weight is achieved;Long Term: Adherence to nutrition and physical activity/exercise program aimed toward attainment of established weight goal;Weight Loss: Understanding of general recommendations for a balanced deficit meal plan, which promotes 1-2 lb weight loss per week and includes a negative energy balance of [phone] kcal/d   Sedentary Yes   Intervention Provide advice, education, support and counseling about physical activity/exercise needs.;Develop an individualized exercise prescription for aerobic and resistive training based on initial evaluation findings, risk stratification, comorbidities and participant's personal goals.   Expected Outcomes Achievement of increased cardiorespiratory fitness and enhanced flexibility, muscular endurance and strength shown through measurements of functional capacity and personal statement of participant.   Increase Strength and Stamina Yes  Not able to be active without SOB stopping her   Intervention Provide advice, education, support and counseling about physical activity/exercise needs.;Develop an individualized exercise prescription for aerobic and resistive training based on initial evaluation findings, risk stratification, comorbidities and participant's personal goals.   Expected Outcomes Achievement of increased cardiorespiratory fitness and enhanced flexibility, muscular endurance and strength shown through measurements of functional capacity and personal statement of participant.   Improve shortness of breath with ADL's Yes   Intervention Provide education, individualized exercise plan and daily activity instruction to help decrease symptoms of SOB with activities of daily living.   Expected Outcomes Short Term: Achieves a reduction of symptoms when performing  activities of daily living.   Develop more efficient breathing techniques such as purse lipped breathing and diaphragmatic breathing; and practicing self-pacing with activity Yes  Demonstrated purse lipped breathing today without prompt.   Intervention Provide education, demonstration and support about specific breathing techniuqes utilized for more efficient breathing. Include techniques such as pursed lipped breathing, diaphragmatic breathing and self-pacing activity.   Expected Outcomes Short Term: Participant will be able to demonstrate and use breathing techniques as needed throughout daily activities.   Hypertension Yes   Intervention Provide education on lifestyle modifcations including regular physical activity/exercise, weight management, moderate sodium restriction and increased consumption of fresh fruit, vegetables, and low fat dairy, alcohol moderation, and smoking cessation.;Monitor prescription use compliance.   Expected Outcomes Short Term: Continued assessment and intervention until BP is < 140/44mm HG in hypertensive participants. < 130/14mm HG in hypertensive participants with diabetes, heart failure  or chronic kidney disease.;Long Term: Maintenance of blood pressure at goal levels.   Lipids Yes   Intervention Provide education and support for participant on nutrition & aerobic/resistive exercise along with prescribed medications to achieve LDL 70mg , HDL >40mg .   Expected Outcomes Short Term: Participant states understanding of desired cholesterol values and is compliant with medications prescribed. Participant is following exercise prescription and nutrition guidelines.;Long Term: Cholesterol controlled with medications as prescribed, with individualized exercise RX and with personalized nutrition plan. Value goals: LDL < 70mg , HDL > 40 mg.      Core Components/Risk Factors/Patient Goals Review:      Goals and Risk Factor Review    Row Name 03/20/16 1328 04/06/16 1223            Core Components/Risk Factors/Patient Goals Review   Personal Goals Review Weight Management/Obesity;Increase Strength and Stamina;Develop more efficient breathing techniques such as purse lipped breathing and diaphragmatic breathing and practicing self-pacing with activity.;Improve shortness of breath with ADL's;Increase knowledge of respiratory medications and ability to use respiratory devices properly.;Hypertension;Lipids Weight Management/Obesity;Sedentary;Increase Strength and Stamina;Develop more efficient breathing techniques such as purse lipped breathing and diaphragmatic breathing and practicing self-pacing with activity.;Improve shortness of breath with ADL's;Lipids;Hypertension;Increase knowledge of respiratory medications and ability to use respiratory devices properly.      Review Keelin is overall pleased with her progress in [facility] so far.  She has not been able to attend as regularly as she would like due to illnesses, as she relies on her daughter to bring her to class.  However, she has noticed that she is more motivated to get up and move around the house and has increased strength to do so, and decreased shortness of breath during ADL's.  She is effectively using pursed lip breathing, but states " I sometimes forget".  We discussed using PLB prior to getting in trouble with her breathing.  She continues to use 2L O2 n/c at home, and 3L O2 when at [place].  Yulitza is knowledgeable about her respiratory medications.  She reports that she tries not to use her rescue inhaler often, because it "gets my a-fib going".  Her blood pressures in [facility] are WNL and she states she takes her medications as prescribed.    Patient stated that she has maintained her weight and SOB seems to currently be about the same. She does feel comfortable with PLB to help control SOB. She stated that she does feel a little bit stronger than before and she is now able to do some house work, which before she  said she didn't do. Blood pressure and lipid panels were reported as good.       Expected Outcomes Continue to attend [facility] regularly.   Continue to regularly attend [facility] and work towards further progression of these goals.          Core Components/Risk Factors/Patient Goals at Discharge (Final Review):      Goals and Risk Factor Review - 04/06/16 1223      Core Components/Risk Factors/Patient Goals Review   Personal Goals Review Weight Management/Obesity;Sedentary;Increase Strength and Stamina;Develop more efficient breathing techniques such as purse lipped breathing and diaphragmatic breathing and practicing self-pacing with activity.;Improve shortness of breath with ADL's;Lipids;Hypertension;Increase knowledge of respiratory medications and ability to use respiratory devices properly.   Review Patient stated that she has maintained her weight and SOB seems to currently be about the same. She does feel comfortable with PLB to help control SOB. She stated that she does feel a little  bit stronger than before and she is now able to do some house work, which before she said she didn't do. Blood pressure and lipid panels were reported as good.    Expected Outcomes Continue to regularly attend [facility] and work towards further progression of these goals.       ITP Comments:     ITP Comments    Row Name 02/18/16 1309 02/26/16 0934 03/18/16 0844 03/20/16 1137     ITP Comments Initial Med review completed today ITP created fro Dr Elta Guadeloupe Sabra Heck to sign. Records sent for scanning Ms Devincenzi's daughter called and states Ms Boruff has a stomach virus and will not be able to attend [facility] today or possibly Friday. Ms Kau's daughter called to inform us that she has the flu and will not be able to bring Ms Starkel to [place] until possibly Friday. Attended [employer] Education Class       Comments: 30 Day Review

## 2016-04-29 ENCOUNTER — Encounter: Payer: Medicare PPO | Admitting: *Deleted

## 2016-04-29 DIAGNOSIS — J449 Chronic obstructive pulmonary disease, unspecified: Secondary | ICD-10-CM

## 2016-04-29 NOTE — Progress Notes (Signed)
Daily Session Note  Patient Details  Name: Summer Bradley MRN: [id number] Date of Birth: August 15, 1945 Referring Provider:    Encounter Date: 04/29/2016  Check In:     Session Check In - 04/29/16 1214      Check-In   Location [facility]-Cardiac & Pulmonary Rehab   Staff Present Alberteen Sam, MA, ACSM RCEP, Exercise Physiologist;Other  Darel Hong RN BSN   Supervising physician immediately available to respond to emergencies [facility] immediately available ER MD   Physician(s) Drs. Marcelene Butte and Darl Householder   Medication changes reported     No   Fall or balance concerns reported    No   Warm-up and Cool-down Performed as group-led Location manager Performed Yes   VAD Patient? No     Pain Assessment   Currently in Pain? No/denies   Multiple Pain Sites No           Exercise Prescription Changes - 04/28/16 1600      Response to Exercise   Blood Pressure (Admit) 132/74   Blood Pressure (Exercise) 134/70   Blood Pressure (Exit) 126/70   Heart Rate (Admit) 60 bpm   Heart Rate (Exercise) 75 bpm   Heart Rate (Exit) 59 bpm   Oxygen Saturation (Admit) 91 %   Oxygen Saturation (Exercise) 93 %   Oxygen Saturation (Exit) 97 %   Rating of Perceived Exertion (Exercise) 13   Perceived Dyspnea (Exercise) 2   Symptoms SOB on treadmill   Duration Continue with 45 min of aerobic exercise without signs/symptoms of physical distress.   Intensity THRR unchanged     Progression   Progression Continue to progress workloads to maintain intensity without signs/symptoms of physical distress.   Average METs 1.76     Resistance Training   Training Prescription Yes   Weight 2 lbs/green band   Reps 10-15     Interval Training   Interval Training No     Oxygen   Oxygen Continuous   Liters 2     Treadmill   MPH 1   Grade 0   Minutes 15   METs 1.77     NuStep   Level 4   Minutes 15   METs 2.3     REL-XR   Level 3   Minutes 15   METs 1.4     Home Exercise Plan   Plans  to continue exercise at Home (comment)  walking and stationary bike   Frequency Add 2 additional days to program exercise sessions.   Initial Home Exercises Provided 03/13/16      History  Smoking Status  . Former Smoker  Smokeless Tobacco  . Not on file    Comment: QUIT SMOKING "13 YEARS AGO"    Goals Met:  Proper associated with RPD/PD & O2 Sat Independence with exercise equipment Using PLB without cueing & demonstrates good technique Exercise tolerated well Strength training completed today  Goals Unmet:  Not Applicable  Comments: Pt able to follow exercise prescription today without complaint.  Will continue to monitor for progression.    Dr. Emily Filbert is Medical Director for [place] and [facility]monary Rehabilitation.

## 2016-05-01 ENCOUNTER — Encounter: Payer: Medicare PPO | Admitting: *Deleted

## 2016-05-01 DIAGNOSIS — J449 Chronic obstructive pulmonary disease, unspecified: Secondary | ICD-10-CM

## 2016-05-01 NOTE — Progress Notes (Signed)
Daily Session Note  Patient Details  Name: Summer Bradley MRN: [id number] Date of Birth: [birth year] Referring Provider:    Encounter Date: 05/01/2016  Check In:     Session Check In - 05/01/16 1229      Check-In   Location [facility]-Cardiac & Pulmonary Rehab   Staff Present Gerlene Burdock, RN, BSN;Patricia Surles RN BSN;Other  Daleen Snook RN   Supervising physician immediately available to respond to emergencies See telemetry face sheet for immediately available ER MD   Medication changes reported     No   Fall or balance concerns reported    No   Warm-up and Cool-down Performed on first and last piece of equipment   Resistance Training Performed Yes   VAD Patient? No     Pain Assessment   Currently in Pain? No/denies         History  Smoking Status  . Former Smoker  Smokeless Tobacco  . Not on file    Comment: QUIT SMOKING "13 YEARS AGO"    Goals Met:  Proper associated with RPD/PD & O2 Sat Exercise tolerated well  Goals Unmet:  Not Applicable  Comments:     Dr. Emily Filbert is Medical Director for [place] and [facility] Pulmonary Rehabilitation.

## 2016-05-04 ENCOUNTER — Ambulatory Visit: Payer: Medicare PPO | Admitting: Neurology

## 2016-05-04 DIAGNOSIS — J449 Chronic obstructive pulmonary disease, unspecified: Secondary | ICD-10-CM

## 2016-05-04 NOTE — Progress Notes (Signed)
Daily Session Note  Patient Details  Name: Summer Bradley MRN: [id number] Date of Birth: [birth year] Referring Provider:    Encounter Date: 05/04/2016  Check In:     Session Check In - 05/04/16 1246      Check-In   Location [facility]-Cardiac & Pulmonary Rehab   Staff Present Carson Myrtle, BS, RRT, Respiratory Bertis Ruddy, BS, ACSM CEP, Exercise Physiologist;Amanda Oletta Darter, BA, ACSM CEP, Exercise Physiologist   Supervising physician immediately available to respond to emergencies [facility] immediately available ER MD   Physician(s) Reita Cliche and Alfred Levins   Medication changes reported     No   Fall or balance concerns reported    No   Warm-up and Cool-down Performed as group-led instruction   Resistance Training Performed Yes         History  Smoking Status  . Former Smoker  Smokeless Tobacco  . Not on file    Comment: QUIT SMOKING "13 YEARS AGO"    Goals Met:  Proper associated with RPD/PD & O2 Sat Independence with exercise equipment Exercise tolerated well Strength training completed today  Goals Unmet:  Not Applicable  Comments: Pt able to follow exercise prescription today without complaint.  Will continue to monitor for progression.    Dr. Emily Filbert is Medical Director for [place] and [facility].

## 2016-05-13 ENCOUNTER — Encounter: Payer: Medicare PPO | Admitting: Respiratory Therapy

## 2016-05-13 DIAGNOSIS — J449 Chronic obstructive pulmonary disease, unspecified: Secondary | ICD-10-CM

## 2016-05-13 NOTE — Progress Notes (Signed)
Daily Session Note  Patient Details  Name: Summer Bradley MRN: [id number] Date of Birth: Jun 19, 1945 Referring Provider:    Encounter Date: 05/13/2016  Check In:     Session Check In - 05/13/16 1412      Check-In   Location [facility]-Cardiac & Pulmonary Rehab   Staff Present Alberteen Sam, MA, ACSM RCEP, Exercise Physiologist;Mary Kellie Shropshire, RN, BSN, [place], BS, RRT, Respiratory Therapist   Supervising physician immediately available to respond to emergencies [facility] immediately available ER MD   Physician(s) Drs. Paduchowski and Williams   Medication changes reported     No   Fall or balance concerns reported    No   Warm-up and Cool-down Performed as group-led Location manager Performed Yes   VAD Patient? No     Pain Assessment   Currently in Pain? No/denies   Multiple Pain Sites No         History  Smoking Status  . Former Smoker  Smokeless Tobacco  . Not on file    Comment: QUIT SMOKING "13 YEARS AGO"    Goals Met:  Proper associated with RPD/PD & O2 Sat Using PLB without cueing & demonstrates good technique Exercise tolerated well Personal goals reviewed Strength training completed today  Goals Unmet:  Not Applicable  Comments: Pt able to follow exercise prescription today without complaint.  Will continue to monitor for progression. See ITP for goals    Dr. Emily Filbert is Medical Director for [place] and [facility].

## 2016-05-15 ENCOUNTER — Encounter: Payer: Medicare PPO | Admitting: *Deleted

## 2016-05-15 DIAGNOSIS — J449 Chronic obstructive pulmonary disease, unspecified: Secondary | ICD-10-CM

## 2016-05-15 NOTE — Progress Notes (Signed)
Daily Session Note  Patient Details  Name: Summer Bradley MRN: [id number] Date of Birth: September 30, 1945 Referring Provider:    Encounter Date: 05/15/2016  Check In:     Session Check In - 05/15/16 1235      Check-In   Location [facility]   Staff Present Nyoka Cowden, RN, BSN, Willette Pa, MA, ACSM RCEP, Exercise Physiologist   Supervising physician immediately available to respond to emergencies [facility] immediately available ER MD   Physician(s) Drs. Malinda and Lord   Medication changes reported     No   Fall or balance concerns reported    No   Warm-up and Cool-down Performed as group-led Location manager Performed Yes   VAD Patient? No     Pain Assessment   Currently in Pain? No/denies   Multiple Pain Sites No         History  Smoking Status  . Former Smoker  Smokeless Tobacco  . Not on file    Comment: QUIT SMOKING "13 YEARS AGO"    Goals Met:  Proper associated with RPD/PD & O2 Sat Independence with exercise equipment Using PLB without cueing & demonstrates good technique Exercise tolerated well Strength training completed today  Goals Unmet:  Not Applicable  Comments: Pt able to follow exercise prescription today without complaint.  Will continue to monitor for progression.    Dr. Emily Filbert is Medical Director for [place] and [facility].

## 2016-05-18 ENCOUNTER — Encounter: Payer: Medicare PPO | Attending: Internal Medicine

## 2016-05-18 DIAGNOSIS — Z87891 Personal history of nicotine dependence: Secondary | ICD-10-CM | POA: Diagnosis not present

## 2016-05-18 DIAGNOSIS — J449 Chronic obstructive pulmonary disease, unspecified: Secondary | ICD-10-CM

## 2016-05-18 DIAGNOSIS — F419 Anxiety disorder, unspecified: Secondary | ICD-10-CM | POA: Diagnosis not present

## 2016-05-18 DIAGNOSIS — Z8673 Personal history of transient ischemic attack (TIA), and cerebral infarction without residual deficits: Secondary | ICD-10-CM | POA: Insufficient documentation

## 2016-05-18 DIAGNOSIS — E7800 Pure hypercholesterolemia, unspecified: Secondary | ICD-10-CM | POA: Insufficient documentation

## 2016-05-18 DIAGNOSIS — I10 Essential (primary) hypertension: Secondary | ICD-10-CM | POA: Diagnosis not present

## 2016-05-18 DIAGNOSIS — I4891 Unspecified atrial fibrillation: Secondary | ICD-10-CM | POA: Insufficient documentation

## 2016-05-18 NOTE — Progress Notes (Signed)
Daily Session Note  Patient Details  Name: Summer Bradley MRN: [id number] Date of Birth: 14-Jan-1946 Referring Provider:    Encounter Date: 05/18/2016  Check In:     Session Check In - 05/18/16 1300      Check-In   Location [facility]   Staff Present Carson Myrtle, BS, RRT, Respiratory Bertis Ruddy, BS, ACSM CEP, Exercise Physiologist;Makynzie Dobesh Oletta Darter, BA, ACSM CEP, Exercise Physiologist   Supervising physician immediately available to respond to emergencies [facility] immediately available ER MD   Physician(s) Quentin Cornwall and Jimmye Norman   Medication changes reported     No   Fall or balance concerns reported    No   Warm-up and Cool-down Performed as group-led instruction   Resistance Training Performed Yes     Pain Assessment   Currently in Pain? No/denies         History  Smoking Status  . Former Smoker  Smokeless Tobacco  . Not on file    Comment: QUIT SMOKING "13 YEARS AGO"    Goals Met:  Proper associated with RPD/PD & O2 Sat Independence with exercise equipment Exercise tolerated well Strength training completed today  Goals Unmet:  Not Applicable  Comments: Pt able to follow exercise prescription today without complaint.  Will continue to monitor for progression.    Dr. Emily Filbert is Medical Director for [place] and [facility].

## 2016-05-20 ENCOUNTER — Encounter: Payer: Medicare PPO | Admitting: *Deleted

## 2016-05-20 DIAGNOSIS — J449 Chronic obstructive pulmonary disease, unspecified: Secondary | ICD-10-CM

## 2016-05-20 NOTE — Progress Notes (Signed)
Daily Session Note  Patient Details  Name: Summer Bradley MRN: [id number] Date of Birth: [birth year] Referring Provider:    Encounter Date: 05/20/2016  Check In:     Session Check In - 05/20/16 1132      Check-In   Location [facility]   Staff Present Alberteen Sam, MA, ACSM RCEP, Exercise Physiologist;Laureen Owens Shark, BS, RRT, Respiratory Therapist;Carroll Enterkin, RN, BSN   Supervising physician immediately available to respond to emergencies [facility] immediately available ER MD   Physician(s) Drs. Lord and [place]   Medication changes reported     No   Fall or balance concerns reported    No   Warm-up and Cool-down Performed as group-led Location manager Performed Yes   VAD Patient? No     Pain Assessment   Currently in Pain? No/denies   Multiple Pain Sites No         History  Smoking Status  . Former Smoker  Smokeless Tobacco  . Not on file    Comment: QUIT SMOKING "13 YEARS AGO"    Goals Met:  Proper associated with RPD/PD & O2 Sat Independence with exercise equipment Using PLB without cueing & demonstrates good technique Exercise tolerated well Strength training completed today  Goals Unmet:  Not Applicable  Comments: Pt able to follow exercise prescription today without complaint.  Will continue to monitor for progression.    Dr. Emily Filbert is Medical Director for [place] and [facility].

## 2016-05-22 ENCOUNTER — Encounter: Payer: Medicare PPO | Admitting: *Deleted

## 2016-05-22 DIAGNOSIS — J449 Chronic obstructive pulmonary disease, unspecified: Secondary | ICD-10-CM | POA: Diagnosis not present

## 2016-05-22 NOTE — Progress Notes (Signed)
Daily Session Note  Patient Details  Name: Summer Bradley MRN: [id number] Date of Birth: November 25, 1945 Referring Provider:    Encounter Date: 05/22/2016  Check In:     Session Check In - 05/22/16 1147      Check-In   Location [facility]   Staff Present Alberteen Sam, MA, ACSM RCEP, Exercise Physiologist;Other  Darel Hong, RN BSN   Supervising physician immediately available to respond to emergencies [facility] immediately available ER MD   Physician(s) Drs. Kinner and [place]   Medication changes reported     No   Fall or balance concerns reported    No   Tobacco Cessation No Change   Warm-up and Cool-down Performed as group-led instruction   Resistance Training Performed Yes   VAD Patient? No     Pain Assessment   Currently in Pain? No/denies   Multiple Pain Sites No         History  Smoking Status  . Former Smoker  Smokeless Tobacco  . Not on file    Comment: QUIT SMOKING "13 YEARS AGO"    Goals Met:  Proper associated with RPD/PD & O2 Sat Independence with exercise equipment Using PLB without cueing & demonstrates good technique Exercise tolerated well Strength training completed today  Goals Unmet:  Not Applicable  Comments: Pt able to follow exercise prescription today without complaint.  Will continue to monitor for progression.    Dr. Emily Filbert is Medical Director for [place] and [facility].

## 2016-05-25 ENCOUNTER — Encounter: Payer: Self-pay | Admitting: Respiratory Therapy

## 2016-05-25 DIAGNOSIS — J449 Chronic obstructive pulmonary disease, unspecified: Secondary | ICD-10-CM

## 2016-05-25 NOTE — Progress Notes (Signed)
Pulmonary Individual Treatment Plan  Patient Details  Name: Summer Bradley MRN: [id number] Date of Birth: [birth year] Referring Provider:    Initial Encounter Date:    Pulmonary Rehab from 02/18/2016 in [facility] Cardiac and Pulmonary Rehab  Date  02/18/16      Visit Diagnosis: COPD, moderate ([place])  Patient's Home Medications on Admission:  Current Outpatient Prescriptions:    acetaminophen (TYLENOL) 325 MG tablet, Take 650 mg by mouth every 6 (six) hours as needed (TAKE 2 TABLETS EVERY 6 HOURS AS NEEDED)., Disp: , Rfl:    albuterol (PROVENTIL HFA;VENTOLIN HFA) 108 (90 BASE) MCG/ACT inhaler, Inhale 1 puff into the lungs every 6 (six) hours as needed for wheezing or shortness of breath., Disp: , Rfl:    apixaban (ELIQUIS) 5 MG TABS tablet, Take 1 tablet (5 mg total) by mouth 2 (two) times daily., Disp: 60 tablet, Rfl: 5   bisoprolol (ZEBETA) 5 MG tablet, Take 0.5 tablets (2.5 mg total) by mouth 2 (two) times daily., Disp: 30 tablet, Rfl: 5   budesonide-formoterol (SYMBICORT) 160-4.5 MCG/ACT inhaler, Inhale 2 puffs into the lungs 2 (two) times daily., Disp: , Rfl:    Calcium Carbonate-Vitamin D 600-400 MG-UNIT per tablet, Take 1 tablet by mouth daily., Disp: , Rfl:    citalopram (CELEXA) 40 MG tablet, , Disp: , Rfl:    diltiazem (DILACOR XR) 240 MG 24 hr capsule, , Disp: , Rfl:    fluticasone (FLONASE) 50 MCG/ACT nasal spray, Place 1 spray into both nostrils daily as needed for allergies. , Disp: , Rfl:    furosemide (LASIX) 20 MG tablet, Take 1 tablet (20 mg total) by mouth 2 (two) times daily., Disp: 30 tablet, Rfl: 5   ipratropium (ATROVENT HFA) 17 MCG/ACT inhaler, Inhale 2 puffs into the lungs 4 (four) times daily., Disp: , Rfl:    levalbuterol (XOPENEX) 0.63 MG/3ML nebulizer solution, Take 3 mLs (0.63 mg total) by nebulization every 6 (six) hours as needed for wheezing or shortness of breath., Disp: 3 mL, Rfl: 12   levothyroxine (SYNTHROID, LEVOTHROID) 150 MCG tablet, Take 150 mcg  by mouth daily before breakfast. , Disp: , Rfl:    Multiple Vitamin (MULTIVITAMIN WITH MINERALS) TABS tablet, Take 1 tablet by mouth daily., Disp: , Rfl:    potassium chloride SA (K-DUR,KLOR-CON) 20 MEQ tablet, Take 2 tablets (40 mEq total) by mouth 2 (two) times daily., Disp: 30 tablet, Rfl: 0  Past Medical History: Past Medical History:  Diagnosis Date   A-fib ([place])    Anxiety    COPD (chronic obstructive pulmonary disease) ([place])    Heart disease    High cholesterol    Hypertension    Stroke ([place])     Tobacco Use: History  Smoking Status   Former Smoker  Smokeless Tobacco   Not on file    Comment: QUIT SMOKING "13 YEARS AGO"    Labs: Recent Review Flowsheet Data    Labs for ITP Cardiac and Pulmonary Rehab Latest Ref Rng & Units 01/29/2014 01/29/2014 01/30/2014 02/01/2014 02/13/2014   Cholestrol 0 - 200 mg/dL - - 114 - -   LDLCALC 0 - 99 mg/dL - - 70 - -   HDL >39 mg/dL - - 24(L) - -   Trlycerides <150 mg/dL 118 - 102 - -   Hemoglobin A1c <5.7 % - - 6.4(H) - -   PHART 7.350 - 7.450 - 7.282(L) - 7.399 7.297(L)   PCO2ART 35.0 - 45.0 mmHg - 54.5(H) - 42.3 56.0(H)   HCO3 20.0 -  24.0 mEq/L - 24.9(H) - 25.6(H) 27.5(H)   TCO2 0 - 100 mmol/L - 26.6 - 26.9 29   ACIDBASEDEF 0.0 - 2.0 mmol/L - 0.9 - - -   O2SAT % - 91.4 - 97.2 94.0       ADL UCSD:     Pulmonary Assessment Scores    Row Name 02/18/16 1323 04/15/16 1408       ADL UCSD   ADL Phase Entry Mid    SOB Score total 89 57    Rest 2 0    Walk 5 3    Stairs 5 5    Bath 5 3    Dress 5 3    Shop 5 3      mMRC Score   mMRC Score  -- 3  entry 4 mMRC       Pulmonary Function Assessment:     Pulmonary Function Assessment - 02/18/16 1322      Breath   Shortness of Breath Fear of Shortness of Breath;Limiting activity;Yes      Exercise Target Goals:    Exercise Program Goal: Individual exercise prescription set with THRR, safety & activity barriers. Participant demonstrates ability to  understand and report RPE using BORG scale, to self-measure pulse accurately, and to acknowledge the importance of the exercise prescription.  Exercise Prescription Goal: Starting with aerobic activity 30 plus minutes a day, 3 days per week for initial exercise prescription. Provide home exercise prescription and guidelines that participant acknowledges understanding prior to discharge.  Activity Barriers & Risk Stratification:     Activity Barriers & Cardiac Risk Stratification - 02/18/16 1329      Activity Barriers & Cardiac Risk Stratification   Activity Barriers --  History of a stroke. Left side numbness and tingling when use arm.      6 Minute Walk:     6 Minute Walk    Row Name 02/18/16 1307 02/18/16 1313       6 Minute Walk   Distance 560 feet 560 feet    Walk Time 5.5 minutes 5.5 minutes    # of Rest Breaks 2 2    MPH 1.15 1.15    METS 1.84 1.84    RPE 13 13    Perceived Dyspnea  3 3    VO2 Peak 3.83 3.83    Symptoms No No    Resting HR 93 bpm 93 bpm    Resting BP 136/74 136/74    Max Ex. HR  -- 122 bpm    Max Ex. BP  -- 146/68      Interval HR   Baseline HR  -- 100    1 Minute HR  -- 93    2 Minute HR  -- 108    3 Minute HR  -- 108    4 Minute HR  -- 122    5 Minute HR  -- 117    6 Minute HR  -- 100    Interval Heart Rate?  -- Yes      Interval Oxygen   Interval Oxygen?  -- Yes    Baseline Oxygen Saturation %  -- 94 %    Baseline Liters of Oxygen  -- 2 L    1 Minute Oxygen Saturation %  -- 90 %    1 Minute Liters of Oxygen  -- 2 L    2 Minute Oxygen Saturation %  -- 90 %    2 Minute Liters of Oxygen  -- 2 L  3 Minute Oxygen Saturation %  -- 90 %    3 Minute Liters of Oxygen  -- 2 L    4 Minute Oxygen Saturation %  -- 89 %    4 Minute Liters of Oxygen  -- 2 L    5 Minute Oxygen Saturation %  -- 95 %    5 Minute Liters of Oxygen  -- 2 L    6 Minute Oxygen Saturation %  -- 86 %    6 Minute Liters of Oxygen  -- 2 L    2 Minute Post Liters of  Oxygen  -- 2 L      Oxygen Initial Assessment:     Oxygen Initial Assessment - 04/27/16 0803      Home Oxygen   Home Oxygen Device Portable Concentrator;Home Concentrator   Sleep Oxygen Prescription Continuous   Liters per minute 2   Home Exercise Oxygen Prescription Continuous   Liters per minute 2   Home at Rest Exercise Oxygen Prescription Continuous   Liters per minute 2   Compliance with Home Oxygen Use Yes     Initial 6 min Walk   Oxygen Used Continuous   Liters per minute 2   Resting Oxygen Saturation  during 6 min walk 94 %   Exercise Oxygen Saturation  during 6 min walk 89 %     Program Oxygen Prescription   Program Oxygen Prescription Continuous   Liters per minute 2     Intervention   Short Term Goals To learn and exhibit compliance with exercise, home and travel O2 prescription;To Learn and understand importance of maintaining oxygen saturations>88%;To learn and demonstrate proper use of respiratory medications;To learn and understand importance of monitoring SPO2 with pulse oximeter and demonstrate accurate use of the pulse oximeter.;To learn and demonstrate proper purse lipped breathing techniques or other breathing techniques.   Long  Term Goals Exhibits compliance with exercise, home and travel O2 prescription;Maintenance of O2 saturations>88%;Compliance with respiratory medication;Verbalizes importance of monitoring SPO2 with pulse oximeter and return demonstration;Exhibits proper breathing techniques, such as purse lipped breathing or other method taught during program session;Demonstrates proper use of MDIs      Oxygen Re-Evaluation:   Oxygen Discharge (Final Oxygen Re-Evaluation):   Initial Exercise Prescription:     Initial Exercise Prescription - 02/18/16 1300      Date of Initial Exercise RX and Referring Provider   Date 02/18/16     Oxygen   Oxygen Continuous   Liters 2     NuStep   Level 1   Minutes 15   METs 1.6     REL-XR    Level 1   Minutes 15   METs 1.6     Track   Laps 15   Minutes 15     Prescription Details   Frequency (times per week) 3   Duration Progress to 45 minutes of aerobic exercise without signs/symptoms of physical distress     Intensity   THRR 40-80% of Max Heartrate 115-137   Ratings of Perceived Exertion 11-13     Progression   Progression Continue to progress workloads to maintain intensity without signs/symptoms of physical distress.     Resistance Training   Training Prescription Yes   Weight 1   Reps 10-15      Perform Capillary Blood Glucose checks as needed.  Exercise Prescription Changes:     Exercise Prescription Changes    Row Name 02/24/16 1200 03/13/16 1300 03/18/16 1500 04/01/16 1500 04/14/16 1500  Response to Exercise   Blood Pressure (Admit) 126/74  -- 128/62 122/64 122/70   Blood Pressure (Exercise) 132/76  -- 130/74 136/72 130/80   Blood Pressure (Exit) 122/84  -- 112/64 112/60 130/76   Heart Rate (Admit) 95 bpm  -- 56 bpm 63 bpm 56 bpm   Heart Rate (Exercise) 108 bpm  -- 71 bpm 70 bpm 75 bpm   Heart Rate (Exit) 105 bpm  -- 56 bpm 86 bpm 55 bpm   Oxygen Saturation (Admit) 94 %  -- 96 % 94 % 95 %   Oxygen Saturation (Exercise) 88 %  -- 92 % 96 % 93 %   Oxygen Saturation (Exit) 96 %  -- 95 % 98 % 97 %   Rating of Perceived Exertion (Exercise) 13  -- 17 13 13    Perceived Dyspnea (Exercise) 2  -- 2 1 1    Symptoms  -- none none none none   Comments  -- Home Exercise Guidelines given 03/13/16 Home Exercise Guidelines given 03/13/16 Home Exercise Guidelines given 03/13/16 Home Exercise Guidelines given 03/13/16   Duration Progress to 45 minutes of aerobic exercise without signs/symptoms of physical distress Progress to 45 minutes of aerobic exercise without signs/symptoms of physical distress Progress to 45 minutes of aerobic exercise without signs/symptoms of physical distress Progress to 45 minutes of aerobic exercise without signs/symptoms of physical distress  Continue with 45 min of aerobic exercise without signs/symptoms of physical distress.   Intensity THRR unchanged THRR unchanged THRR unchanged THRR unchanged THRR unchanged     Progression   Progression Continue to progress workloads to maintain intensity without signs/symptoms of physical distress. Continue to progress workloads to maintain intensity without signs/symptoms of physical distress. Continue to progress workloads to maintain intensity without signs/symptoms of physical distress. Continue to progress workloads to maintain intensity without signs/symptoms of physical distress. Continue to progress workloads to maintain intensity without signs/symptoms of physical distress.   Average METs  --  -- 1.9 1.93 1.7     Resistance Training   Training Prescription Yes Yes Yes Yes Yes   Weight 1 1 yellow band 2 lbs 2 lbs   Reps  --  -- 10-15 10-15 10-15     Interval Training   Interval Training  --  -- No No No     Oxygen   Oxygen Continuous Continuous Continuous Continuous Continuous   Liters 2 2 2 2 2   3L on treadmill     Treadmill   MPH  --  --  -- 0.9 0.8   Grade  --  --  -- 0 0   Minutes  --  --  -- 15 15  continuous   METs  --  --  -- 1.7 1.7     NuStep   Level 1 1 1 4 4    Minutes 15 15 15 15 15    METs 1.8 1.8 2  77 spm 2.3  71 spm 2.3     REL-XR   Level  --  -- 1 2 2    Minutes  --  -- 15 15 15    METs  --  -- 1.9  38 spm 1.8 1.8     Biostep-RELP   Level 1 1  --  --  --   Minutes 15 15  --  --  --   METs 2 2  --  --  --     Track   Minutes  --  -- 5  --  --  Home Exercise Plan   Plans to continue exercise at  -- Home  walking and stationary bike Home  walking and stationary bike Home  walking and stationary bike Home (comment)  walking and stationary bike   Frequency  -- Add 1 additional day to program exercise sessions. Add 1 additional day to program exercise sessions. Add 1 additional day to program exercise sessions. Add 1 additional day to program  exercise sessions.   Initial Home Exercises Provided  --  --  --  -- 03/13/16     Exercise Review   Progression  --  -- Yes Yes  --   Row Name 04/28/16 1600 05/13/16 1500           Response to Exercise   Blood Pressure (Admit) 132/74 118/66      Blood Pressure (Exercise) 134/70 136/70      Blood Pressure (Exit) 126/70 124/64      Heart Rate (Admit) 60 bpm 54 bpm      Heart Rate (Exercise) 75 bpm 76 bpm      Heart Rate (Exit) 59 bpm 58 bpm      Oxygen Saturation (Admit) 91 % 96 %      Oxygen Saturation (Exercise) 93 % 93 %      Oxygen Saturation (Exit) 97 % 92 %      Rating of Perceived Exertion (Exercise) 13 11      Perceived Dyspnea (Exercise) 2 2      Symptoms SOB on treadmill SOB on treadmill      Duration Continue with 45 min of aerobic exercise without signs/symptoms of physical distress. Continue with 45 min of aerobic exercise without signs/symptoms of physical distress.      Intensity THRR unchanged THRR unchanged        Progression   Progression Continue to progress workloads to maintain intensity without signs/symptoms of physical distress. Continue to progress workloads to maintain intensity without signs/symptoms of physical distress.      Average METs 1.76 1.76        Resistance Training   Training Prescription Yes Yes      Weight 2 lbs/green band Green band      Reps 10-15 10-15        Interval Training   Interval Training No No        Oxygen   Oxygen Continuous Continuous      Liters 2 2        Treadmill   MPH 1 1      Grade 0 0      Minutes 15 15      METs 1.77 1.77        NuStep   Level 4 4      SPM  -- 71      Minutes 15 15      METs 2.3 2        REL-XR   Level 3 3      Minutes 15 15      METs 1.4 1.5  46 rpm        Home Exercise Plan   Plans to continue exercise at Home (comment)  walking and stationary bike Home (comment)  walking and stationary bike      Frequency Add 2 additional days to program exercise sessions. Add 2 additional  days to program exercise sessions.      Initial Home Exercises Provided 03/13/16 03/13/16         Exercise Comments:  Exercise Comments    Row Name 02/24/16 1257 03/13/16 1331 03/18/16 1515 03/25/16 1318 04/01/16 1537   Exercise Comments  First full day of exercise!  Patient was oriented to gym and equipment including functions, settings, policies, and procedures.  Patient's individual exercise prescription and treatment plan were reviewed.  All starting workloads were established based on the results of the 6 minute walk test done at initial orientation visit.  The plan for exercise progression was also introduced and progression will be customized based on patient's performance and goals. Reviewed home exercise with pt today.  Pt plans to walk and use stationary bike at home for exercise.  Reviewed THR, pulse, RPE, sign and symptoms, and when to call 911 or MD.  Also discussed weather considerations and indoor options.  Pt voiced understanding. Summer Bradley is off to a good start with exercise.  She needs lots of encouragement to keep moving.  She is able to do 15 min on each machine, but does need rest when walking.  She often gets side tracked and will stop exercisng to talk.  We will continue to monitor her progression and keep her moving. Summer Bradley went 7 min on the treadmill today!! Summer Bradley has been doing much better in rehab.  She is doing better following along during weights! Today she was able to do 10 min on the treadmill total!  We will continue to monitor her progression.   [place] Name 04/10/16 1330           Exercise Comments 15 min on treadmill again today!!          Exercise Goals and Review:     Exercise Goals    Row Name 04/28/16 1643             Exercise Goals   Increase Physical Activity Yes       Intervention Provide advice, education, support and counseling about physical activity/exercise needs.;Develop an individualized exercise prescription for aerobic and resistive  training based on initial evaluation findings, risk stratification, comorbidities and participant's personal goals.       Expected Outcomes Achievement of increased cardiorespiratory fitness and enhanced flexibility, muscular endurance and strength shown through measurements of functional capacity and personal statement of participant.       Increase Strength and Stamina Yes       Intervention Provide advice, education, support and counseling about physical activity/exercise needs.;Develop an individualized exercise prescription for aerobic and resistive training based on initial evaluation findings, risk stratification, comorbidities and participant's personal goals.       Expected Outcomes Achievement of increased cardiorespiratory fitness and enhanced flexibility, muscular endurance and strength shown through measurements of functional capacity and personal statement of participant.          Exercise Goals Re-Evaluation :     Exercise Goals Re-Evaluation    Row Name 04/14/16 1548 04/15/16 1403 04/17/16 1238 04/28/16 1644 05/13/16 1546     Exercise Goal Re-Evaluation   Exercise Goals Review Increase Physical Activity;Increase Strenth and Stamina Increase Physical Activity;Increase Strenth and Stamina Increase Strenth and Stamina;Increase Physical Activity Increase Physical Activity;Increase Strenth and Stamina Increase Physical Activity;Increase Strenth and Stamina   Comments Summer Bradley is coming up on her half way point.  She is up to 15 minutes on the treadmill now!  Her stamina is really improving.  She also getting through class by herself now, no longer needing her daughter or as much help from the staff.  We will continue to monitor her progression. Summer Bradley improved her  walk test by 125 ft!! Pennie was able to change the sheets on her bed yesterday!! Summer Bradley has been working hard in rehab.  She is now mostly independent doing weights.  She is also up to level 3 on the XR and 1.80mph on treadmill.  We  will continue to monitor her progression.  --   Expected Outcomes Short: Summer Bradley will start to increase her speed on treadmill.  Long: Continue to come to classes to work on Microbiologist and Long: Keep coming to class to work on strength and stamina to become more independent Short and Long: Keep coming to class to work on strength and stamina to become more independent Short: Continue to work on Lawyer.  Long: Increase strength and stamina for independence!!  --   [place] Name 05/13/16 1547 05/15/16 1236           Exercise Goal Re-Evaluation   Exercise Goals Review Increase Physical Activity;Increase Strenth and Stamina Increase Strenth and Stamina      Comments Summer Bradley returned today after being out for a week.  She has been doing well in rehab.  She is now using the green band with weights!  We will continue to monitor her progress. Summer Bradley increased her treadmill to 0.5% grade!!      Expected Outcomes Short: Add incline to her treadmill.  Long: Use her silver sneakers to maintain her home exercise and possibly look into [employer]. Short and Long: Continue to improve strength and stamina by coming to rehab.         Discharge Exercise Prescription (Final Exercise Prescription Changes):     Exercise Prescription Changes - 05/13/16 1500      Response to Exercise   Blood Pressure (Admit) 118/66   Blood Pressure (Exercise) 136/70   Blood Pressure (Exit) 124/64   Heart Rate (Admit) 54 bpm   Heart Rate (Exercise) 76 bpm   Heart Rate (Exit) 58 bpm   Oxygen Saturation (Admit) 96 %   Oxygen Saturation (Exercise) 93 %   Oxygen Saturation (Exit) 92 %   Rating of Perceived Exertion (Exercise) 11   Perceived Dyspnea (Exercise) 2   Symptoms SOB on treadmill   Duration Continue with 45 min of aerobic exercise without signs/symptoms of physical distress.   Intensity THRR unchanged     Progression   Progression Continue to progress workloads to maintain intensity  without signs/symptoms of physical distress.   Average METs 1.76     Resistance Training   Training Prescription Yes   Weight Green band   Reps 10-15     Interval Training   Interval Training No     Oxygen   Oxygen Continuous   Liters 2     Treadmill   MPH 1   Grade 0   Minutes 15   METs 1.77     NuStep   Level 4   SPM 71   Minutes 15   METs 2     REL-XR   Level 3   Minutes 15   METs 1.5  46 rpm     Home Exercise Plan   Plans to continue exercise at Home (comment)  walking and stationary bike   Frequency Add 2 additional days to program exercise sessions.   Initial Home Exercises Provided 03/13/16      Nutrition:  Target Goals: Understanding of nutrition guidelines, daily intake of sodium 1500mg , cholesterol 200mg , calories 30% from fat and 7% or less from saturated fats, daily to  have 5 or more servings of fruits and vegetables.  Biometrics:     Pre Biometrics - 02/18/16 1306      Pre Biometrics   Height 5\' 2"  (1.575 m)   Weight 193 lb 14.4 oz (88 kg)   Waist Circumference 44.5 inches   Hip Circumference 43.5 inches   Waist to Hip Ratio 1.02 %   BMI (Calculated) 35.5       Nutrition Therapy Plan and Nutrition Goals:     Nutrition Therapy & Goals - 02/18/16 1311      Intervention Plan   Intervention Prescribe, educate and counsel regarding individualized specific dietary modifications aiming towards targeted core components such as weight, hypertension, lipid management, diabetes, heart failure and other comorbidities.   Expected Outcomes Short Term Goal: A plan has been developed with personal nutrition goals set during dietitian appointment.      Nutrition Discharge: Rate Your Plate Scores:   Nutrition Goals Re-Evaluation:     Nutrition Goals Re-Evaluation    Row Name 03/20/16 1322             Goals   Current Weight 193 lb (87.5 kg)         Intervention Plan   Intervention Continue to educate, counsel and set short/long term  goals regarding individualized specific personal dietary modifications.       Comments Summer Bradley does not state any personal nutrition goals at this time.  She states her appetite is normal and that her neighbor brings her meals often.  She is willing to meet with the dietician in an effort to help meet her weight management goal.  But appointment should be made with her daughter.            Nutrition Goals Discharge (Final Nutrition Goals Re-Evaluation):     Nutrition Goals Re-Evaluation - 03/20/16 1322      Goals   Current Weight 193 lb (87.5 kg)     Intervention Plan   Intervention Continue to educate, counsel and set short/long term goals regarding individualized specific personal dietary modifications.   Comments Summer Bradley does not state any personal nutrition goals at this time.  She states her appetite is normal and that her neighbor brings her meals often.  She is willing to meet with the dietician in an effort to help meet her weight management goal.  But appointment should be made with her daughter.        Psychosocial: Target Goals: Acknowledge presence or absence of significant depression and/or stress, maximize coping skills, provide positive support system. Participant is able to verbalize types and ability to use techniques and skills needed for reducing stress and depression.   Initial Review & Psychosocial Screening:     Initial Psych Review & Screening - 02/18/16 1314      Initial Review   Current issues with Current Psychotropic Meds     Family Dynamics   Good Support System? Yes  Daughter and other children     Barriers   Psychosocial barriers to participate in program There are no identifiable barriers or psychosocial needs.;The patient should benefit from training in stress management and relaxation.     Screening Interventions   Interventions Encouraged to exercise      Quality of Life Scores:     Quality of Life - 02/18/16 1315      Quality of Life  Scores   Health/Function Pre 21 %   Socioeconomic Pre 18.33 %   Psych/Spiritual Pre 21 %  Family Pre 21 %   GLOBAL Pre 20.5 %      PHQ-9: Recent Review Flowsheet Data    Depression screen [facility] 2/9 02/18/2016   Decreased Interest 2   Down, Depressed, Hopeless 1   PHQ - 2 Score 3   Altered sleeping 2   Tired, decreased energy 3   Change in appetite 3   Feeling bad or failure about yourself  3   Trouble concentrating 3   Moving slowly or fidgety/restless 3   Suicidal thoughts 0   PHQ-9 Score 20   Difficult doing work/chores Extremely dIfficult     Interpretation of Total Score  Total Score Depression Severity:  1-4 = Minimal depression, 5-9 = Mild depression, 10-14 = Moderate depression, 15-19 = Moderately severe depression, 20-27 = Severe depression   Psychosocial Evaluation and Intervention:   Psychosocial Re-Evaluation:     Psychosocial Re-Evaluation    [place] Name 02/28/16 1027 03/20/16 1324 04/15/16 1401         Psychosocial Re-Evaluation   Current issues with  --  -- None Identified     Comments Summer Bradley called and said she is sorry that she can't make [facility] today since she is sick.  Summer Bradley denies extra stress in her life right now and denies any difficulty sleeping.  She does endorse some anxiety, over "small everyday things" like when her clothes get twisted up while dressing or when she gets tangled in her O2 tubing.  We discussed using some of the relaxation and deep breathing techniques she is learning in [place].  She lives alone has wonderful support from her 2 daughters who help her keep up with her medications and medical appointments.  A neighbor brings her meals often.   Patient psychosocial assessment reveals no barriers to participation in Pulmonary Rehab. There are no phsychosocial areas that are currently affecting patient's rehab experience.Patient does continue to exhibit positive coping skills to deal with her psychosocial concerns. Offered  emotional support and reassurance. Patient does feel she is making progress toward Pulmonary Rehab goals. Patient reports her health and activity level has improved in the past 30 days as evidenced by patient's report of increased ability to to more around the house like make a peach cobbler. Patient states family/friends have noticed changes in her activity or mood. Patient reports feeling positive about current and projected progression in Pulmonary Rehab. After reviewing the patient's treatment plan, the patient is making progress toward Pulmonary Rehab goals. Patient's rate of progress toward rehab goals is good. Plan of action to help patient continue to work towards rehab goals include continuing to come to exercise and education classes. Will continue to monitor and evaluate progress toward psychosocial goal(s).     Expected Outcomes  --  -- Short: Continue to come to exercise and education classes. Long: Able to do more at home on her own.     Interventions  -- Encouraged to attend Pulmonary Rehabilitation for the exercise;Relaxation education Encouraged to attend Pulmonary Rehabilitation for the exercise;Relaxation education     Continue Psychosocial Services   -- Yes Follow up required by staff        Psychosocial Discharge (Final Psychosocial Re-Evaluation):     Psychosocial Re-Evaluation - 04/15/16 1401      Psychosocial Re-Evaluation   Current issues with None Identified   Comments Patient psychosocial assessment reveals no barriers to participation in Pulmonary Rehab. There are no phsychosocial areas that are currently affecting patient's rehab experience.Patient does continue to exhibit positive  coping skills to deal with her psychosocial concerns. Offered emotional support and reassurance. Patient does feel she is making progress toward Pulmonary Rehab goals. Patient reports her health and activity level has improved in the past 30 days as evidenced by patient's report of increased  ability to to more around the house like make a peach cobbler. Patient states family/friends have noticed changes in her activity or mood. Patient reports feeling positive about current and projected progression in Pulmonary Rehab. After reviewing the patient's treatment plan, the patient is making progress toward Pulmonary Rehab goals. Patient's rate of progress toward rehab goals is good. Plan of action to help patient continue to work towards rehab goals include continuing to come to exercise and education classes. Will continue to monitor and evaluate progress toward psychosocial goal(s).   Expected Outcomes Short: Continue to come to exercise and education classes. Long: Able to do more at home on her own.   Interventions Encouraged to attend Pulmonary Rehabilitation for the exercise;Relaxation education   Continue Psychosocial Services  Follow up required by staff      Education: Education Goals: Education classes will be provided on a weekly basis, covering required topics. Participant will state understanding/return demonstration of topics presented.  Learning Barriers/Preferences:     Learning Barriers/Preferences - 02/18/16 1321      Learning Barriers/Preferences   Learning Barriers None   Learning Preferences None      Education Topics: Initial Evaluation Education: - Verbal, written and demonstration of respiratory meds, RPE/PD scales, oximetry and breathing techniques. Instruction on use of nebulizers and MDIs: cleaning and proper use, rinsing mouth with steroid doses and importance of monitoring MDI activations.   Pulmonary Rehab from 05/22/2016 in [facility] Cardiac and Pulmonary Rehab  Date  02/18/16  Educator  Sb  Instruction Review Code  2- meets goals/outcomes      General Nutrition Guidelines/Fats and Fiber: -Group instruction provided by verbal, written material, models and posters to present the general guidelines for heart healthy nutrition. Gives an explanation and  review of dietary fats and fiber.   Pulmonary Rehab from 05/22/2016 in [facility] Cardiac and Pulmonary Rehab  Date  04/20/16  Educator  CR  Instruction Review Code  2- meets goals/outcomes      Controlling Sodium/Reading Food Labels: -Group verbal and written material supporting the discussion of sodium use in heart healthy nutrition. Review and explanation with models, verbal and written materials for utilization of the food label.   Pulmonary Rehab from 05/22/2016 in [facility] Cardiac and Pulmonary Rehab  Date  05/04/16  Educator  CR  Instruction Review Code  2- meets goals/outcomes      Exercise Physiology & Risk Factors: - Group verbal and written instruction with models to review the exercise physiology of the cardiovascular system and associated critical values. Details cardiovascular disease risk factors and the goals associated with each risk factor.   Pulmonary Rehab from 05/22/2016 in [facility] Cardiac and Pulmonary Rehab  Date  05/22/16  Educator  [place]  Instruction Review Code  2- meets goals/outcomes      Aerobic Exercise & Resistance Training: - Gives group verbal and written discussion on the health impact of inactivity. On the components of aerobic and resistive training programs and the benefits of this training and how to safely progress through these programs.   Flexibility, Balance, General Exercise Guidelines: - Provides group verbal and written instruction on the benefits of flexibility and balance training programs. Provides general exercise guidelines with specific guidelines to those with heart  or lung disease. Demonstration and skill practice provided.   Pulmonary Rehab from 05/22/2016 in [facility] Cardiac and Pulmonary Rehab  Date  04/10/16  Educator  AS  Instruction Review Code  2- meets goals/outcomes      Stress Management: - Provides group verbal and written instruction about the health risks of elevated stress, cause of high stress, and healthy ways to reduce stress.    Pulmonary Rehab from 05/22/2016 in [facility] Cardiac and Pulmonary Rehab  Date  05/20/16  Educator  [facility]  Instruction Review Code  2- meets goals/outcomes      Depression: - Provides group verbal and written instruction on the correlation between heart/lung disease and depressed mood, treatment options, and the stigmas associated with seeking treatment.   Pulmonary Rehab from 05/22/2016 in [facility] Cardiac and Pulmonary Rehab  Date  04/22/16  Educator  [facility]  Instruction Review Code  2- meets goals/outcomes      Exercise & Equipment Safety: - Individual verbal instruction and demonstration of equipment use and safety with use of the equipment.   Pulmonary Rehab from 05/22/2016 in [facility] Cardiac and Pulmonary Rehab  Date  02/24/16  Educator  AS  Instruction Review Code  2- meets goals/outcomes      Infection Prevention: - Provides verbal and written material to individual with discussion of infection control including proper hand washing and proper equipment cleaning during exercise session.   Pulmonary Rehab from 05/22/2016 in [facility] Cardiac and Pulmonary Rehab  Date  02/24/16  Educator  AS  Instruction Review Code  2- meets goals/outcomes      Falls Prevention: - Provides verbal and written material to individual with discussion of falls prevention and safety.   Pulmonary Rehab from 05/22/2016 in [facility] Cardiac and Pulmonary Rehab  Date  02/18/16  Educator  SB  Instruction Review Code  2- meets goals/outcomes      Diabetes: - Individual verbal and written instruction to review signs/symptoms of diabetes, desired ranges of glucose level fasting, after meals and with exercise. Advice that pre and post exercise glucose checks will be done for 3 sessions at entry of program.   Chronic Lung Diseases: - Group verbal and written instruction to review new updates, new respiratory medications, new advancements in procedures and treatments. Provide informative websites and "800" numbers of self-education.    Pulmonary Rehab from 05/22/2016 in [facility] Cardiac and Pulmonary Rehab  Date  03/11/16  Educator  LB  Instruction Review Code  2- meets goals/outcomes      Lung Procedures: - Group verbal and written instruction to describe testing methods done to diagnose lung disease. Review the outcome of test results. Describe the treatment choices: Pulmonary Function Tests, ABGs and oximetry.   Energy Conservation: - Provide group verbal and written instruction for methods to conserve energy, plan and organize activities. Instruct on pacing techniques, use of adaptive equipment and posture/positioning to relieve shortness of breath.   Pulmonary Rehab from 05/22/2016 in [facility] Cardiac and Pulmonary Rehab  Date  04/15/16  Educator  [facility]  Instruction Review Code  2- meets goals/outcomes      Triggers: - Group verbal and written instruction to review types of environmental controls: home humidity, furnaces, filters, dust mite/pet prevention, HEPA vacuums. To discuss weather changes, air quality and the benefits of nasal washing.   Pulmonary Rehab from 05/22/2016 in [facility] Cardiac and Pulmonary Rehab  Date  04/08/16  Educator  LB  Instruction Review Code  2- meets goals/outcomes      Exacerbations: - Group  verbal and written instruction to provide: warning signs, infection symptoms, calling MD promptly, preventive modes, and value of vaccinations. Review: effective airway clearance, coughing and/or vibration techniques. Create an Sports administrator.   Pulmonary Rehab from 05/22/2016 in [facility] Cardiac and Pulmonary Rehab  Date  04/01/16  Educator  LB  Instruction Review Code  2- meets goals/outcomes      Oxygen: - Individual and group verbal and written instruction on oxygen therapy. Includes supplement oxygen, available portable oxygen systems, continuous and intermittent flow rates, oxygen safety, concentrators, and Medicare reimbursement for oxygen.   Respiratory Medications: - Group verbal and written  instruction to review medications for lung disease. Drug class, frequency, complications, importance of spacers, rinsing mouth after steroid MDI's, and proper cleaning methods for nebulizers.   AED/CPR: - Group verbal and written instruction with the use of models to demonstrate the basic use of the AED with the basic ABC's of resuscitation.   Breathing Retraining: - Provides individuals verbal and written instruction on purpose, frequency, and proper technique of diaphragmatic breathing and pursed-lipped breathing. Applies individual practice skills.   Pulmonary Rehab from 05/22/2016 in [facility] Cardiac and Pulmonary Rehab  Date  02/24/16  Educator  AS  Instruction Review Code  2- meets goals/outcomes      Anatomy and Physiology of the Lungs: - Group verbal and written instruction with the use of models to provide basic lung anatomy and physiology related to function, structure and complications of lung disease.   Heart Failure: - Group verbal and written instruction on the basics of heart failure: signs/symptoms, treatments, explanation of ejection fraction, enlarged heart and cardiomyopathy.   Pulmonary Rehab from 05/22/2016 in [facility] Cardiac and Pulmonary Rehab  Date  04/03/16  Educator  CE  Instruction Review Code  2- meets goals/outcomes      Sleep Apnea: - Individual verbal and written instruction to review Obstructive Sleep Apnea. Review of risk factors, methods for diagnosing and types of masks and machines for OSA.   Anxiety: - Provides group, verbal and written instruction on the correlation between heart/lung disease and anxiety, treatment options, and management of anxiety.   Pulmonary Rehab from 05/22/2016 in [facility] Cardiac and Pulmonary Rehab  Date  05/20/16  Educator  [facility]  Instruction Review Code  2- Meets goals/outcomes      Relaxation: - Provides group, verbal and written instruction about the benefits of relaxation for patients with heart/lung disease. Also provides  patients with examples of relaxation techniques.   Pulmonary Rehab from 05/22/2016 in [facility] Cardiac and Pulmonary Rehab  Date  03/25/16  Educator  [facility]  Instruction Review Code  2- Meets goals/outcomes      Knowledge Questionnaire Score:     Knowledge Questionnaire Score - 02/18/16 1322      Knowledge Questionnaire Score   Pre Score 1/10       Core Components/Risk Factors/Patient Goals at Admission:     Personal Goals and Risk Factors at Admission - 02/18/16 1311      Core Components/Risk Factors/Patient Goals on Admission    Weight Management Obesity;Weight Maintenance;Yes   Admit Weight 193 lb 14.4 oz (88 kg)   Goal Weight: Short Term 190 lb (86.2 kg)   Goal Weight: Long Term 160 lb (72.6 kg)   Expected Outcomes Short Term: Continue to assess and modify interventions until short term weight is achieved;Long Term: Adherence to nutrition and physical activity/exercise program aimed toward attainment of established weight goal;Weight Loss: Understanding of general recommendations for a balanced deficit meal plan,  which promotes 1-2 lb weight loss per week and includes a negative energy balance of [phone] kcal/d   Sedentary Yes   Intervention Provide advice, education, support and counseling about physical activity/exercise needs.;Develop an individualized exercise prescription for aerobic and resistive training based on initial evaluation findings, risk stratification, comorbidities and participant's personal goals.   Expected Outcomes Achievement of increased cardiorespiratory fitness and enhanced flexibility, muscular endurance and strength shown through measurements of functional capacity and personal statement of participant.   Increase Strength and Stamina Yes  Not able to be active without SOB stopping her   Intervention Provide advice, education, support and counseling about physical activity/exercise needs.;Develop an individualized exercise prescription for aerobic and resistive  training based on initial evaluation findings, risk stratification, comorbidities and participant's personal goals.   Expected Outcomes Achievement of increased cardiorespiratory fitness and enhanced flexibility, muscular endurance and strength shown through measurements of functional capacity and personal statement of participant.   Improve shortness of breath with ADL's Yes   Intervention Provide education, individualized exercise plan and daily activity instruction to help decrease symptoms of SOB with activities of daily living.   Expected Outcomes Short Term: Achieves a reduction of symptoms when performing activities of daily living.   Develop more efficient breathing techniques such as purse lipped breathing and diaphragmatic breathing; and practicing self-pacing with activity Yes  Demonstrated purse lipped breathing today without prompt.   Intervention Provide education, demonstration and support about specific breathing techniuqes utilized for more efficient breathing. Include techniques such as pursed lipped breathing, diaphragmatic breathing and self-pacing activity.   Expected Outcomes Short Term: Participant will be able to demonstrate and use breathing techniques as needed throughout daily activities.   Hypertension Yes   Intervention Provide education on lifestyle modifcations including regular physical activity/exercise, weight management, moderate sodium restriction and increased consumption of fresh fruit, vegetables, and low fat dairy, alcohol moderation, and smoking cessation.;Monitor prescription use compliance.   Expected Outcomes Short Term: Continued assessment and intervention until BP is < 140/38mm HG in hypertensive participants. < 130/24mm HG in hypertensive participants with diabetes, heart failure or chronic kidney disease.;Long Term: Maintenance of blood pressure at goal levels.   Lipids Yes   Intervention Provide education and support for participant on nutrition &  aerobic/resistive exercise along with prescribed medications to achieve LDL 70mg , HDL >40mg .   Expected Outcomes Short Term: Participant states understanding of desired cholesterol values and is compliant with medications prescribed. Participant is following exercise prescription and nutrition guidelines.;Long Term: Cholesterol controlled with medications as prescribed, with individualized exercise RX and with personalized nutrition plan. Value goals: LDL < 70mg , HDL > 40 mg.      Core Components/Risk Factors/Patient Goals Review:      Goals and Risk Factor Review    Row Name 03/20/16 1328 04/06/16 1223           Core Components/Risk Factors/Patient Goals Review   Personal Goals Review Weight Management/Obesity;Increase Strength and Stamina;Develop more efficient breathing techniques such as purse lipped breathing and diaphragmatic breathing and practicing self-pacing with activity.;Improve shortness of breath with ADL's;Increase knowledge of respiratory medications and ability to use respiratory devices properly.;Hypertension;Lipids Weight Management/Obesity;Sedentary;Increase Strength and Stamina;Develop more efficient breathing techniques such as purse lipped breathing and diaphragmatic breathing and practicing self-pacing with activity.;Improve shortness of breath with ADL's;Lipids;Hypertension;Increase knowledge of respiratory medications and ability to use respiratory devices properly.      Review Summer Bradley is overall pleased with her progress in [facility] so far.  She has not been able to  attend as regularly as she would like due to illnesses, as she relies on her daughter to bring her to class.  However, she has noticed that she is more motivated to get up and move around the house and has increased strength to do so, and decreased shortness of breath during ADL's.  She is effectively using pursed lip breathing, but states " I sometimes forget".  We discussed using PLB prior to getting in  trouble with her breathing.  She continues to use 2L O2 n/c at home, and 3L O2 when at [place].  Summer Bradley is knowledgeable about her respiratory medications.  She reports that she tries not to use her rescue inhaler often, because it "gets my a-fib going".  Her blood pressures in [facility] are WNL and she states she takes her medications as prescribed.    Patient stated that she has maintained her weight and SOB seems to currently be about the same. She does feel comfortable with PLB to help control SOB. She stated that she does feel a little bit stronger than before and she is now able to do some house work, which before she said she didn't do. Blood pressure and lipid panels were reported as good.       Expected Outcomes Continue to attend [facility] regularly.   Continue to regularly attend [facility] and work towards further progression of these goals.          Core Components/Risk Factors/Patient Goals at Discharge (Final Review):      Goals and Risk Factor Review - 04/06/16 1223      Core Components/Risk Factors/Patient Goals Review   Personal Goals Review Weight Management/Obesity;Sedentary;Increase Strength and Stamina;Develop more efficient breathing techniques such as purse lipped breathing and diaphragmatic breathing and practicing self-pacing with activity.;Improve shortness of breath with ADL's;Lipids;Hypertension;Increase knowledge of respiratory medications and ability to use respiratory devices properly.   Review Patient stated that she has maintained her weight and SOB seems to currently be about the same. She does feel comfortable with PLB to help control SOB. She stated that she does feel a little bit stronger than before and she is now able to do some house work, which before she said she didn't do. Blood pressure and lipid panels were reported as good.    Expected Outcomes Continue to regularly attend [facility] and work towards further progression of these goals.       ITP  Comments:     ITP Comments    Row Name 02/18/16 1309 02/26/16 0934 03/18/16 0844 03/20/16 1137 05/13/16 1546   ITP Comments Initial Med review completed today ITP created fro Dr Elta Guadeloupe Sabra Heck to sign. Records sent for scanning Ms Pugsley's daughter called and states Ms Santiago has a stomach virus and will not be able to attend [facility] today or possibly Friday. Ms Sunderland's daughter called to inform us that she has the flu and will not be able to bring Ms Coverdale to [place] until possibly Friday. Attended [employer] Education Class Joniyah returned today after being out for a week while her daughter (her transportation) was out of town.      Comments: 30 Day Note Review

## 2016-05-25 NOTE — Progress Notes (Signed)
Daily Session Note  Patient Details  Name: Summer Bradley MRN: [id number] Date of Birth: May 28, 1945 Referring Provider:    Encounter Date: 05/25/2016  Check In:     Session Check In - 05/25/16 1405      Check-In   Location [facility]-Cardiac & Pulmonary Rehab   Staff Present Nada Maclachlan, BA, ACSM CEP, Exercise Physiologist;Laureen Owens Shark, BS, RRT, Respiratory Bertis Ruddy, BS, ACSM CEP, Exercise Physiologist   Supervising physician immediately available to respond to emergencies [facility] immediately available ER MD   Physician(s) Corky Downs and Jimmye Norman   Medication changes reported     No   Fall or balance concerns reported    No   Warm-up and Cool-down Performed as group-led instruction   Resistance Training Performed Yes   VAD Patient? No     Pain Assessment   Currently in Pain? No/denies         History  Smoking Status  . Former Smoker  Smokeless Tobacco  . Not on file    Comment: QUIT SMOKING "13 YEARS AGO"    Goals Met:  Proper associated with RPD/PD & O2 Sat Independence with exercise equipment Exercise tolerated well Strength training completed today  Goals Unmet:  Not Applicable  Comments: Pt able to follow exercise prescription today without complaint.  Will continue to monitor for progression.    Dr. Emily Filbert is Medical Director for [place] and [facility]monary Rehabilitation.

## 2016-05-27 ENCOUNTER — Encounter: Payer: Medicare PPO | Admitting: *Deleted

## 2016-05-27 DIAGNOSIS — J449 Chronic obstructive pulmonary disease, unspecified: Secondary | ICD-10-CM

## 2016-05-27 NOTE — Progress Notes (Signed)
Daily Session Note  Patient Details  Name: Summer Bradley MRN: [id number] Date of Birth: [birth year] Referring Provider:    Encounter Date: 05/27/2016  Check In:     Session Check In - 05/27/16 1130      Check-In   Location [facility]   Staff Present Alberteen Sam, MA, ACSM RCEP, Exercise Physiologist;Laureen Owens Shark, BS, RRT, Respiratory Therapist;Carroll Enterkin, RN, BSN   Supervising physician immediately available to respond to emergencies [facility] immediately available ER MD   Physician(s) Drs. Jimmye Norman and Darl Householder   Medication changes reported     No   Fall or balance concerns reported    No   Warm-up and Cool-down Performed as group-led Location manager Performed Yes   VAD Patient? No     Pain Assessment   Currently in Pain? No/denies   Multiple Pain Sites No         History  Smoking Status  . Former Smoker  Smokeless Tobacco  . Not on file    Comment: QUIT SMOKING "13 YEARS AGO"    Goals Met:  Proper associated with RPD/PD & O2 Sat Independence with exercise equipment Using PLB without cueing & demonstrates good technique Exercise tolerated well Strength training completed today  Goals Unmet:  Not Applicable  Comments: Pt able to follow exercise prescription today without complaint.  Will continue to monitor for progression.    Dr. Emily Filbert is Medical Director for [place] and [facility].

## 2016-05-29 ENCOUNTER — Encounter: Payer: Medicare PPO | Admitting: *Deleted

## 2016-05-29 DIAGNOSIS — J449 Chronic obstructive pulmonary disease, unspecified: Secondary | ICD-10-CM | POA: Diagnosis not present

## 2016-05-29 NOTE — Progress Notes (Signed)
Daily Session Note  Patient Details  Name: Summer Bradley MRN: [id number] Date of Birth: March 02, 1945 Referring Provider:    Encounter Date: 05/29/2016  Check In:     Session Check In - 05/29/16 1228      Check-In   Location [facility]   Staff Present Nyoka Cowden, RN, BSN, Willette Pa, MA, ACSM RCEP, Exercise Physiologist   Supervising physician immediately available to respond to emergencies [facility] immediately available ER MD   Physician(s) Drs. McShane and Schaevitz   Medication changes reported     No   Fall or balance concerns reported    No   Warm-up and Cool-down Performed as group-led Location manager Performed Yes   VAD Patient? No     Pain Assessment   Currently in Pain? No/denies   Multiple Pain Sites No         History  Smoking Status  . Former Smoker  Smokeless Tobacco  . Not on file    Comment: QUIT SMOKING "13 YEARS AGO"    Goals Met:  Proper associated with RPD/PD & O2 Sat Independence with exercise equipment Using PLB without cueing & demonstrates good technique Exercise tolerated well Strength training completed today  Goals Unmet:  Not Applicable  Comments: Pt able to follow exercise prescription today without complaint.  Will continue to monitor for progression.     [place] Name 02/18/16 1307 02/18/16 1313 05/29/16 1229     6 Minute Walk   Phase  -  - Discharge   Distance 560 feet 560 feet 600 feet   Distance % Change  -  - 7.14 %  40 ft   Walk Time 5.5 minutes 5.5 minutes 4.82 minutes   # of Rest Breaks _0 sec and 1:06   MPH 1.15 1.15 1.41   METS 1.84 1.84 1.02   RPE _1 Perceived Dyspnea  _2 VO2 Peak 3.83 3.83 3.56   Symptoms No No Yes (comment)   Comments  -  - SOB, breathing rapid   Resting HR 93 bpm 93 bpm 60 bpm   Resting BP 136/74 136/74 126/74   Max Ex. HR  - 122 bpm 92 bpm   Max Ex. BP  - 146/68 126/74     Interval HR   Baseline HR  -  100 60   1 Minute HR  - 93 78   2 Minute HR  - 108 85   3 Minute HR  - 108 84   4 Minute HR  - 122  -   5 Minute HR  - 117 84   6 Minute HR  - 100 92   2 Minute Post HR  -  - 77   Interval Heart Rate?  - Yes Yes     Interval Oxygen   Interval Oxygen?  - Yes Yes   Baseline Oxygen Saturation %  - 94 % 93 %   Baseline Liters of Oxygen  - 2 L 2 L   1 Minute Oxygen Saturation %  - 90 % 94 %   1 Minute Liters of Oxygen  - 2 L 2 L   2 Minute Oxygen Saturation %  - 90 % 90 %   2 Minute Liters of Oxygen  - 2 L 2 L   3 Minute Oxygen Saturation %  - 90 % 92 %   3 Minute  Liters of Oxygen  - 2 L 2 L   4 Minute Oxygen Saturation %  - 89 %  -   4 Minute Liters of Oxygen  - 2 L 2 L   5 Minute Oxygen Saturation %  - 95 % 93 %   5 Minute Liters of Oxygen  - 2 L 2 L   6 Minute Oxygen Saturation %  - 86 % 93 %   6 Minute Liters of Oxygen  - 2 L 2 L   2 Minute Post Oxygen Saturation %  -  - 93 %   2 Minute Post Liters of Oxygen  - 2 L 2 L        Dr. Emily Filbert is Medical Director for [place] and [facility].

## 2016-06-01 DIAGNOSIS — J449 Chronic obstructive pulmonary disease, unspecified: Secondary | ICD-10-CM | POA: Diagnosis not present

## 2016-06-01 NOTE — Progress Notes (Signed)
Daily Session Note  Patient Details  Name: Summer Bradley MRN: [id number] Date of Birth: Aug 31, 1945 Referring Provider:    Encounter Date: 06/01/2016  Check In:     Session Check In - 06/01/16 1246      Check-In   Location [facility]   Staff Present Earlean Shawl, BS, ACSM CEP, Exercise Physiologist;Laureen Owens Shark, BS, RRT, Respiratory Dareen Piano, BA, ACSM CEP, Exercise Physiologist   Supervising physician immediately available to respond to emergencies [facility] immediately available ER MD   Physician(s) [place] and [place]   Medication changes reported     No   Fall or balance concerns reported    No   Warm-up and Cool-down Performed as group-led instruction   Resistance Training Performed Yes   VAD Patient? No     Pain Assessment   Currently in Pain? No/denies         History  Smoking Status  . Former Smoker  Smokeless Tobacco  . Not on file    Comment: QUIT SMOKING "13 YEARS AGO"    Goals Met:  Proper associated with RPD/PD & O2 Sat Independence with exercise equipment Exercise tolerated well Strength training completed today  Goals Unmet:  Not Applicable  Comments: Pt able to follow exercise prescription today without complaint.  Will continue to monitor for progression.    Dr. Emily Filbert is Medical Director for [place] and [facility].

## 2016-06-03 ENCOUNTER — Encounter: Payer: Medicare PPO | Admitting: *Deleted

## 2016-06-03 DIAGNOSIS — J449 Chronic obstructive pulmonary disease, unspecified: Secondary | ICD-10-CM

## 2016-06-03 NOTE — Progress Notes (Signed)
Daily Session Note  Patient Details  Name: Summer Bradley MRN: [id number] Date of Birth: Mar 22, 1945 Referring Provider:    Encounter Date: 06/03/2016  Check In:     Session Check In - 06/03/16 1126      Check-In   Location [facility]   Staff Present Alberteen Sam, MA, ACSM RCEP, Exercise Physiologist;Laureen Owens Shark, BS, RRT, Respiratory Therapist;Rebecca Brayton El, DPT, CEEA   Supervising physician immediately available to respond to emergencies [facility] immediately available ER MD   Physician(s) Drs. Quentin Cornwall and [place]   Medication changes reported     No   Fall or balance concerns reported    No   Warm-up and Cool-down Performed as group-led Location manager Performed Yes   VAD Patient? No     Pain Assessment   Currently in Pain? No/denies   Multiple Pain Sites No         History  Smoking Status  . Former Smoker  Smokeless Tobacco  . Not on file    Comment: QUIT SMOKING "13 YEARS AGO"    Goals Met:  Proper associated with RPD/PD & O2 Sat Using PLB without cueing & demonstrates good technique Exercise tolerated well Strength training completed today  Goals Unmet:  Not Applicable  Comments: Pt able to follow exercise prescription today without complaint.  Will continue to monitor for progression.    Dr. Emily Filbert is Medical Director for [place] and [facility].

## 2016-06-10 ENCOUNTER — Encounter: Payer: Medicare PPO | Admitting: *Deleted

## 2016-06-10 DIAGNOSIS — J449 Chronic obstructive pulmonary disease, unspecified: Secondary | ICD-10-CM | POA: Diagnosis not present

## 2016-06-10 NOTE — Patient Instructions (Signed)
Discharge Progress Report  Patient Details  Name: Summer Bradley MRN: [id number] Date of Birth: 1945-11-25 Referring Provider:  Allyne Gee, MD   Number of Visits: 36/36  Reason for Discharge:  Patient reached a stable level of exercise.  Smoking History:  History  Smoking Status  . Former Smoker  Smokeless Tobacco  . Not on file    Comment: QUIT SMOKING "13 YEARS AGO"    Diagnosis:  COPD, moderate ([place])  Initial Exercise Prescription:     Initial Exercise Prescription - 02/18/16 1300      Date of Initial Exercise RX and Referring Provider   Date 02/18/16     Oxygen   Oxygen Continuous   Liters 2     NuStep   Level 1   Minutes 15   METs 1.6     REL-XR   Level 1   Minutes 15   METs 1.6     Track   Laps 15   Minutes 15     Prescription Details   Frequency (times per week) 3   Duration Progress to 45 minutes of aerobic exercise without signs/symptoms of physical distress     Intensity   THRR 40-80% of Max Heartrate 115-137   Ratings of Perceived Exertion 11-13     Progression   Progression Continue to progress workloads to maintain intensity without signs/symptoms of physical distress.     Resistance Training   Training Prescription Yes   Weight 1   Reps 10-15      Discharge Exercise Prescription (Final Exercise Prescription Changes):     Exercise Prescription Changes - 06/10/16 1400      Response to Exercise   Blood Pressure (Admit) 104/62   Blood Pressure (Exercise) 124/68   Blood Pressure (Exit) 120/70   Heart Rate (Admit) 82 bpm   Heart Rate (Exercise) 78 bpm   Heart Rate (Exit) 83 bpm   Oxygen Saturation (Admit) 95 %   Oxygen Saturation (Exercise) 94 %   Oxygen Saturation (Exit) 95 %   Rating of Perceived Exertion (Exercise) 11   Perceived Dyspnea (Exercise) 1   Symptoms SOB on treadmill   Duration Continue with 45 min of aerobic exercise without signs/symptoms of physical distress.   Intensity THRR unchanged     Progression    Progression Continue to progress workloads to maintain intensity without signs/symptoms of physical distress.   Average METs 1.88     Resistance Training   Training Prescription Yes   Weight 2 lbs   Reps 10-15     Interval Training   Interval Training No     Oxygen   Oxygen Continuous   Liters 2     Treadmill   MPH 1   Grade 0.5   Minutes 15   METs 1.83     NuStep   Level 4   SPM 86   Minutes 15   METs 2     REL-XR   Level 3   Watts 35   Minutes 15   METs 1.8     Home Exercise Plan   Plans to continue exercise at Home (comment)  walking and stationary bike   Frequency Add 2 additional days to program exercise sessions.   Initial Home Exercises Provided 03/13/16      Functional Capacity:     6 Minute Walk    [place] Name 02/18/16 1307 02/18/16 1313 05/29/16 1229     6 Minute Walk   Phase  -  - Discharge  Distance 560 feet 560 feet 600 feet   Distance % Change  -  - 7.14 %  40 ft   Walk Time 5.5 minutes 5.5 minutes 4.82 minutes   # of Rest Breaks 2 2 2  7  sec and 1:06   MPH 1.15 1.15 1.41   METS 1.84 1.84 1.02   RPE 13 13 15    Perceived Dyspnea  3 3 3    VO2 Peak 3.83 3.83 3.56   Symptoms No No Yes (comment)   Comments  -  - SOB, breathing rapid   Resting HR 93 bpm 93 bpm 60 bpm   Resting BP 136/74 136/74 126/74   Max Ex. HR  - 122 bpm 92 bpm   Max Ex. BP  - 146/68 126/74     Interval HR   Baseline HR  - 100 60   1 Minute HR  - 93 78   2 Minute HR  - 108 85   3 Minute HR  - 108 84   4 Minute HR  - 122  -   5 Minute HR  - 117 84   6 Minute HR  - 100 92   2 Minute Post HR  -  - 77   Interval Heart Rate?  - Yes Yes     Interval Oxygen   Interval Oxygen?  - Yes Yes   Baseline Oxygen Saturation %  - 94 % 93 %   Baseline Liters of Oxygen  - 2 L 2 L   1 Minute Oxygen Saturation %  - 90 % 94 %   1 Minute Liters of Oxygen  - 2 L 2 L   2 Minute Oxygen Saturation %  - 90 % 90 %   2 Minute Liters of Oxygen  - 2 L 2 L   3 Minute Oxygen Saturation  %  - 90 % 92 %   3 Minute Liters of Oxygen  - 2 L 2 L   4 Minute Oxygen Saturation %  - 89 %  -   4 Minute Liters of Oxygen  - 2 L 2 L   5 Minute Oxygen Saturation %  - 95 % 93 %   5 Minute Liters of Oxygen  - 2 L 2 L   6 Minute Oxygen Saturation %  - 86 % 93 %   6 Minute Liters of Oxygen  - 2 L 2 L   2 Minute Post Oxygen Saturation %  -  - 93 %   2 Minute Post Liters of Oxygen  - 2 L 2 L      Quality of Life:     Quality of Life - 06/03/16 1533      Quality of Life Scores   Health/Function Pre 21 %   Health/Function Post 15.2 %   Health/Function % Change -27.62 %   Socioeconomic Pre 18.33 %   Socioeconomic Post 27.6 %   Socioeconomic % Change  50.57 %   Psych/Spiritual Pre 21 %   Psych/Spiritual Post 18 %   Psych/Spiritual % Change -14.29 %   Family Pre 21 %   Family Post 18 %   Family % Change -14.29 %   GLOBAL Pre 20.5 %   GLOBAL Post 18.19 %   GLOBAL % Change -11.27 %      Personal Goals: Goals established at orientation with interventions provided to work toward goal.     Personal Goals and Risk Factors at Admission - 02/18/16 1311  Core Components/Risk Factors/Patient Goals on Admission    Weight Management Obesity;Weight Maintenance;Yes   Admit Weight 193 lb 14.4 oz (88 kg)   Goal Weight: Short Term 190 lb (86.2 kg)   Goal Weight: Long Term 160 lb (72.6 kg)   Expected Outcomes Short Term: Continue to assess and modify interventions until short term weight is achieved;Long Term: Adherence to nutrition and physical activity/exercise program aimed toward attainment of established weight goal;Weight Loss: Understanding of general recommendations for a balanced deficit meal plan, which promotes 1-2 lb weight loss per week and includes a negative energy balance of [phone] kcal/d   Sedentary Yes   Intervention Provide advice, education, support and counseling about physical activity/exercise needs.;Develop an individualized exercise prescription for aerobic and  resistive training based on initial evaluation findings, risk stratification, comorbidities and participant's personal goals.   Expected Outcomes Achievement of increased cardiorespiratory fitness and enhanced flexibility, muscular endurance and strength shown through measurements of functional capacity and personal statement of participant.   Increase Strength and Stamina Yes  Not able to be active without SOB stopping her   Intervention Provide advice, education, support and counseling about physical activity/exercise needs.;Develop an individualized exercise prescription for aerobic and resistive training based on initial evaluation findings, risk stratification, comorbidities and participant's personal goals.   Expected Outcomes Achievement of increased cardiorespiratory fitness and enhanced flexibility, muscular endurance and strength shown through measurements of functional capacity and personal statement of participant.   Improve shortness of breath with ADL's Yes   Intervention Provide education, individualized exercise plan and daily activity instruction to help decrease symptoms of SOB with activities of daily living.   Expected Outcomes Short Term: Achieves a reduction of symptoms when performing activities of daily living.   Develop more efficient breathing techniques such as purse lipped breathing and diaphragmatic breathing; and practicing self-pacing with activity Yes  Demonstrated purse lipped breathing today without prompt.   Intervention Provide education, demonstration and support about specific breathing techniuqes utilized for more efficient breathing. Include techniques such as pursed lipped breathing, diaphragmatic breathing and self-pacing activity.   Expected Outcomes Short Term: Participant will be able to demonstrate and use breathing techniques as needed throughout daily activities.   Hypertension Yes   Intervention Provide education on lifestyle modifcations including  regular physical activity/exercise, weight management, moderate sodium restriction and increased consumption of fresh fruit, vegetables, and low fat dairy, alcohol moderation, and smoking cessation.;Monitor prescription use compliance.   Expected Outcomes Short Term: Continued assessment and intervention until BP is < 140/72mm HG in hypertensive participants. < 130/62mm HG in hypertensive participants with diabetes, heart failure or chronic kidney disease.;Long Term: Maintenance of blood pressure at goal levels.   Lipids Yes   Intervention Provide education and support for participant on nutrition & aerobic/resistive exercise along with prescribed medications to achieve LDL 70mg , HDL >40mg .   Expected Outcomes Short Term: Participant states understanding of desired cholesterol values and is compliant with medications prescribed. Participant is following exercise prescription and nutrition guidelines.;Long Term: Cholesterol controlled with medications as prescribed, with individualized exercise RX and with personalized nutrition plan. Value goals: LDL < 70mg , HDL > 40 mg.       Personal Goals Discharge:     Goals and Risk Factor Review - 06/02/16 0624      Core Components/Risk Factors/Patient Goals Review   Review Ms Posas be graduating soon. She has increased her stamina and conditioning and can now be more independent at home. She is doing her laundry, vacuuming, and some  cooking. She plans to continue her exercise in [employer] for both her  exercise and socialization. We reviewed her inhalers again to make sure she has a good understanding of them. She is independent with her oxygen. Attending on a regular basis and enjoying education, Ms Thal is commended for her participation in [place].   Expected Outcomes Continue to exercise in [employer], be active at home, and continue self-management of her COPD as learned in [place].      Nutrition & Weight - Outcomes:     Pre Biometrics -  02/18/16 1306      Pre Biometrics   Height 5\' 2"  (1.575 m)   Weight 193 lb 14.4 oz (88 kg)   Waist Circumference 44.5 inches   Hip Circumference 43.5 inches   Waist to Hip Ratio 1.02 %   BMI (Calculated) 35.5       Nutrition:     Nutrition Therapy & Goals - 02/18/16 1311      Intervention Plan   Intervention Prescribe, educate and counsel regarding individualized specific dietary modifications aiming towards targeted core components such as weight, hypertension, lipid management, diabetes, heart failure and other comorbidities.   Expected Outcomes Short Term Goal: A plan has been developed with personal nutrition goals set during dietitian appointment.      Nutrition Discharge:   Education Questionnaire Score:     Knowledge Questionnaire Score - 05/27/16 1421      Knowledge Questionnaire Score   Pre Score 1/10   Post Score 10/10      Goals reviewed with patient; copy given to patient.

## 2016-06-10 NOTE — Progress Notes (Signed)
Daily Session Note  Patient Details  Name: Summer Bradley MRN: [id number] Date of Birth: 06-30-1945 Referring Provider:    Encounter Date: 06/10/2016  Check In:     Session Check In - 06/10/16 1135      Check-In   Location [facility]   Staff Present Nyoka Cowden, RN, BSN, Willette Pa, MA, ACSM RCEP, Exercise Physiologist;Laureen Janell Quiet, RRT, Respiratory Therapist   Supervising physician immediately available to respond to emergencies [facility] immediately available ER MD   Physician(s) Drs. Joni Fears and [place]   Medication changes reported     No   Fall or balance concerns reported    No   Warm-up and Cool-down Performed as group-led Location manager Performed Yes   VAD Patient? No     Pain Assessment   Currently in Pain? No/denies   Multiple Pain Sites No         History  Smoking Status  . Former Smoker  Smokeless Tobacco  . Not on file    Comment: QUIT SMOKING "13 YEARS AGO"    Goals Met:  Proper associated with RPD/PD & O2 Sat Using PLB without cueing & demonstrates good technique Exercise tolerated well Strength training completed today  Goals Unmet:  Not Applicable  Comments: Pt able to follow exercise prescription today without complaint.  Will continue to monitor for progression.    Dr. Emily Filbert is Medical Director for [place] and [facility].

## 2016-06-12 ENCOUNTER — Encounter: Payer: Medicare PPO | Admitting: *Deleted

## 2016-06-12 DIAGNOSIS — J449 Chronic obstructive pulmonary disease, unspecified: Secondary | ICD-10-CM

## 2016-06-12 NOTE — Progress Notes (Signed)
Daily Session Note  Patient Details  Name: Summer Bradley MRN: [id number] Date of Birth: 08-May-1945 Referring Provider:    Encounter Date: 06/12/2016  Check In:     Session Check In - 06/12/16 1139      Check-In   Location [facility]   Staff Present Summer Cowden, RN, BSN, Willette Pa, MA, ACSM RCEP, Exercise Physiologist  Summer Hong RN BSN   Supervising physician immediately available to respond to emergencies [facility] immediately available ER MD   Physician(s) Drs. Quale and McShane   Medication changes reported     No   Fall or balance concerns reported    No   Tobacco Cessation No Change   Warm-up and Cool-down Performed as group-led instruction   Resistance Training Performed Yes   VAD Patient? No     Pain Assessment   Currently in Pain? No/denies   Multiple Pain Sites No         History  Smoking Status  . Former Smoker  Smokeless Tobacco  . Not on file    Comment: QUIT SMOKING "13 YEARS AGO"    Goals Met:  Proper associated with RPD/PD & O2 Sat Independence with exercise equipment Using PLB without cueing & demonstrates good technique Exercise tolerated well Strength training completed today  Goals Unmet:  Not Applicable  Comments:  Summer Bradley graduated today from cardiac rehab with 36 sessions completed.  Details of the patient's exercise prescription and what She needs to do in order to continue the prescription and progress were discussed with patient.  Patient was given a copy of prescription and goals.  Patient verbalized understanding.  Berdene plans to continue to exercise by coming to [employer].     Dr. Emily Bradley is Medical Director for [place] and [facility].

## 2016-06-12 NOTE — Progress Notes (Signed)
Discharge Summary  Patient Details  Name: Summer Bradley MRN: [id number] Date of Birth: 1945-10-06 Referring Provider:     Number of Visits: 36/36  Reason for Discharge:  Patient reached a stable level of exercise.  Smoking History:  History  Smoking Status  . Former Smoker  Smokeless Tobacco  . Not on file    Comment: QUIT SMOKING "34 YEARS AGO"    Diagnosis:  COPD, moderate ([place])  ADL [facility]:     Pulmonary Assessment Scores    Row Name 02/18/16 1323 04/15/16 1408 06/03/16 1534     ADL [facility]   ADL Phase Entry Mid Exit   SOB Score total 89 57 49   Rest 2 0 0   Walk 5 3 1    Stairs 5 5 5    Bath 5 3 2    Dress 5 3 4    Shop 5 3 3      mMRC Score   mMRC Score  - 3  entry 4 mMRC  -      Initial Exercise Prescription:     Initial Exercise Prescription - 02/18/16 1300      Date of Initial Exercise RX and Referring Provider   Date 02/18/16     Oxygen   Oxygen Continuous   Liters 2     NuStep   Level 1   Minutes 15   METs 1.6     REL-XR   Level 1   Minutes 15   METs 1.6     Track   Laps 15   Minutes 15     Prescription Details   Frequency (times per week) 3   Duration Progress to 45 minutes of aerobic exercise without signs/symptoms of physical distress     Intensity   THRR 40-80% of Max Heartrate 115-137   Ratings of Perceived Exertion 11-13     Progression   Progression Continue to progress workloads to maintain intensity without signs/symptoms of physical distress.     Resistance Training   Training Prescription Yes   Weight 1   Reps 10-15      Discharge Exercise Prescription (Final Exercise Prescription Changes):     Exercise Prescription Changes - 06/10/16 1400      Response to Exercise   Blood Pressure (Admit) 104/62   Blood Pressure (Exercise) 124/68   Blood Pressure (Exit) 120/70   Heart Rate (Admit) 82 bpm   Heart Rate (Exercise) 78 bpm   Heart Rate (Exit) 83 bpm   Oxygen Saturation (Admit) 95 %   Oxygen Saturation  (Exercise) 94 %   Oxygen Saturation (Exit) 95 %   Rating of Perceived Exertion (Exercise) 11   Perceived Dyspnea (Exercise) 1   Symptoms SOB on treadmill   Duration Continue with 45 min of aerobic exercise without signs/symptoms of physical distress.   Intensity THRR unchanged     Progression   Progression Continue to progress workloads to maintain intensity without signs/symptoms of physical distress.   Average METs 1.88     Resistance Training   Training Prescription Yes   Weight 2 lbs   Reps 10-15     Interval Training   Interval Training No     Oxygen   Oxygen Continuous   Liters 2     Treadmill   MPH 1   Grade 0.5   Minutes 15   METs 1.83     NuStep   Level 4   SPM 86   Minutes 15   METs 2     REL-XR  Level 3   Watts 35   Minutes 15   METs 1.8     Home Exercise Plan   Plans to continue exercise at Home (comment)  walking and stationary bike   Frequency Add 2 additional days to program exercise sessions.   Initial Home Exercises Provided 03/13/16      Functional Capacity:     6 Minute Walk    Row Name 02/18/16 1307 02/18/16 1313 05/29/16 1229     6 Minute Walk   Phase  -  - Discharge   Distance 560 feet 560 feet 600 feet   Distance % Change  -  - 7.14 %  40 ft   Walk Time 5.5 minutes 5.5 minutes 4.82 minutes   # of Rest Breaks 2 2 2  7  sec and 1:06   MPH 1.15 1.15 1.41   METS 1.84 1.84 1.02   RPE 13 13 15    Perceived Dyspnea  3 3 3    VO2 Peak 3.83 3.83 3.56   Symptoms No No Yes (comment)   Comments  -  - SOB, breathing rapid   Resting HR 93 bpm 93 bpm 60 bpm   Resting BP 136/74 136/74 126/74   Max Ex. HR  - 122 bpm 92 bpm   Max Ex. BP  - 146/68 126/74     Interval HR   Baseline HR  - 100 60   1 Minute HR  - 93 78   2 Minute HR  - 108 85   3 Minute HR  - 108 84   4 Minute HR  - 122  -   5 Minute HR  - 117 84   6 Minute HR  - 100 92   2 Minute Post HR  -  - 77   Interval Heart Rate?  - Yes Yes     Interval Oxygen   Interval  Oxygen?  - Yes Yes   Baseline Oxygen Saturation %  - 94 % 93 %   Baseline Liters of Oxygen  - 2 L 2 L   1 Minute Oxygen Saturation %  - 90 % 94 %   1 Minute Liters of Oxygen  - 2 L 2 L   2 Minute Oxygen Saturation %  - 90 % 90 %   2 Minute Liters of Oxygen  - 2 L 2 L   3 Minute Oxygen Saturation %  - 90 % 92 %   3 Minute Liters of Oxygen  - 2 L 2 L   4 Minute Oxygen Saturation %  - 89 %  -   4 Minute Liters of Oxygen  - 2 L 2 L   5 Minute Oxygen Saturation %  - 95 % 93 %   5 Minute Liters of Oxygen  - 2 L 2 L   6 Minute Oxygen Saturation %  - 86 % 93 %   6 Minute Liters of Oxygen  - 2 L 2 L   2 Minute Post Oxygen Saturation %  -  - 93 %   2 Minute Post Liters of Oxygen  - 2 L 2 L      Psychological, QOL, Others - Outcomes: PHQ 2/9: Depression screen [facility] 2/9 06/03/2016 02/18/2016  Decreased Interest 1 2  Down, Depressed, Hopeless 0 1  PHQ - 2 Score 1 3  Altered sleeping 0 2  Tired, decreased energy 3 3  Change in appetite 2 3  Feeling bad or failure about yourself  0 3  Trouble concentrating 0 3  Moving slowly or fidgety/restless 0 3  Suicidal thoughts 0 0  PHQ-9 Score 6 20  Difficult doing work/chores Not difficult at all Extremely dIfficult    Quality of Life:     Quality of Life - 06/03/16 1533      Quality of Life Scores   Health/Function Pre 21 %   Health/Function Post 15.2 %   Health/Function % Change -27.62 %   Socioeconomic Pre 18.33 %   Socioeconomic Post 27.6 %   Socioeconomic % Change  50.57 %   Psych/Spiritual Pre 21 %   Psych/Spiritual Post 18 %   Psych/Spiritual % Change -14.29 %   Family Pre 21 %   Family Post 18 %   Family % Change -14.29 %   GLOBAL Pre 20.5 %   GLOBAL Post 18.19 %   GLOBAL % Change -11.27 %      Personal Goals: Goals established at orientation with interventions provided to work toward goal.     Personal Goals and Risk Factors at Admission - 02/18/16 1311      Core Components/Risk Factors/Patient Goals on Admission     Weight Management Obesity;Weight Maintenance;Yes   Admit Weight 193 lb 14.4 oz (88 kg)   Goal Weight: Short Term 190 lb (86.2 kg)   Goal Weight: Long Term 160 lb (72.6 kg)   Expected Outcomes Short Term: Continue to assess and modify interventions until short term weight is achieved;Long Term: Adherence to nutrition and physical activity/exercise program aimed toward attainment of established weight goal;Weight Loss: Understanding of general recommendations for a balanced deficit meal plan, which promotes 1-2 lb weight loss per week and includes a negative energy balance of [phone] kcal/d   Sedentary Yes   Intervention Provide advice, education, support and counseling about physical activity/exercise needs.;Develop an individualized exercise prescription for aerobic and resistive training based on initial evaluation findings, risk stratification, comorbidities and participant's personal goals.   Expected Outcomes Achievement of increased cardiorespiratory fitness and enhanced flexibility, muscular endurance and strength shown through measurements of functional capacity and personal statement of participant.   Increase Strength and Stamina Yes  Not able to be active without SOB stopping her   Intervention Provide advice, education, support and counseling about physical activity/exercise needs.;Develop an individualized exercise prescription for aerobic and resistive training based on initial evaluation findings, risk stratification, comorbidities and participant's personal goals.   Expected Outcomes Achievement of increased cardiorespiratory fitness and enhanced flexibility, muscular endurance and strength shown through measurements of functional capacity and personal statement of participant.   Improve shortness of breath with ADL's Yes   Intervention Provide education, individualized exercise plan and daily activity instruction to help decrease symptoms of SOB with activities of daily living.    Expected Outcomes Short Term: Achieves a reduction of symptoms when performing activities of daily living.   Develop more efficient breathing techniques such as purse lipped breathing and diaphragmatic breathing; and practicing self-pacing with activity Yes  Demonstrated purse lipped breathing today without prompt.   Intervention Provide education, demonstration and support about specific breathing techniuqes utilized for more efficient breathing. Include techniques such as pursed lipped breathing, diaphragmatic breathing and self-pacing activity.   Expected Outcomes Short Term: Participant will be able to demonstrate and use breathing techniques as needed throughout daily activities.   Hypertension Yes   Intervention Provide education on lifestyle modifcations including regular physical activity/exercise, weight management, moderate sodium restriction and increased consumption of fresh fruit, vegetables, and low fat  dairy, alcohol moderation, and smoking cessation.;Monitor prescription use compliance.   Expected Outcomes Short Term: Continued assessment and intervention until BP is < 140/47mm HG in hypertensive participants. < 130/65mm HG in hypertensive participants with diabetes, heart failure or chronic kidney disease.;Long Term: Maintenance of blood pressure at goal levels.   Lipids Yes   Intervention Provide education and support for participant on nutrition & aerobic/resistive exercise along with prescribed medications to achieve LDL 70mg , HDL >40mg .   Expected Outcomes Short Term: Participant states understanding of desired cholesterol values and is compliant with medications prescribed. Participant is following exercise prescription and nutrition guidelines.;Long Term: Cholesterol controlled with medications as prescribed, with individualized exercise RX and with personalized nutrition plan. Value goals: LDL < 70mg , HDL > 40 mg.       Personal Goals Discharge:     Goals and Risk Factor  Review    Row Name 03/20/16 1328 04/06/16 1223 06/02/16 0624         Core Components/Risk Factors/Patient Goals Review   Personal Goals Review Weight Management/Obesity;Increase Strength and Stamina;Develop more efficient breathing techniques such as purse lipped breathing and diaphragmatic breathing and practicing self-pacing with activity.;Improve shortness of breath with ADL's;Increase knowledge of respiratory medications and ability to use respiratory devices properly.;Hypertension;Lipids Weight Management/Obesity;Sedentary;Increase Strength and Stamina;Develop more efficient breathing techniques such as purse lipped breathing and diaphragmatic breathing and practicing self-pacing with activity.;Improve shortness of breath with ADL's;Lipids;Hypertension;Increase knowledge of respiratory medications and ability to use respiratory devices properly.  -     Review Lesta is overall pleased with her progress in [facility] so far.  She has not been able to attend as regularly as she would like due to illnesses, as she relies on her daughter to bring her to class.  However, she has noticed that she is more motivated to get up and move around the house and has increased strength to do so, and decreased shortness of breath during ADL's.  She is effectively using pursed lip breathing, but states " I sometimes forget".  We discussed using PLB prior to getting in trouble with her breathing.  She continues to use 2L O2 n/c at home, and 3L O2 when at [place].  Angenette is knowledgeable about her respiratory medications.  She reports that she tries not to use her rescue inhaler often, because it "gets my a-fib going".  Her blood pressures in [facility] are WNL and she states she takes her medications as prescribed.    Patient stated that she has maintained her weight and SOB seems to currently be about the same. She does feel comfortable with PLB to help control SOB. She stated that she does feel a little bit stronger than  before and she is now able to do some house work, which before she said she didn't do. Blood pressure and lipid panels were reported as good.  Ms Both be graduating soon. She has increased her stamina and conditioning and can now be more independent at home. She is doing her laundry, vacuuming, and some cooking. She plans to continue her exercise in [employer] for both her  exercise and socialization. We reviewed her inhalers again to make sure she has a good understanding of them. She is independent with her oxygen. Attending on a regular basis and enjoying education, Ms Kallenberger is commended for her participation in [place].     Expected Outcomes Continue to attend [facility] regularly.   Continue to regularly attend [facility] and work towards further progression of these goals.  Continue  to exercise in [employer], be active at home, and continue self-management of her COPD as learned in [place].        Nutrition & Weight - Outcomes:     Pre Biometrics - 02/18/16 1306      Pre Biometrics   Height 5\' 2"  (1.575 m)   Weight 193 lb 14.4 oz (88 kg)   Waist Circumference 44.5 inches   Hip Circumference 43.5 inches   Waist to Hip Ratio 1.02 %   BMI (Calculated) 35.5       Nutrition:     Nutrition Therapy & Goals - 02/18/16 1311      Intervention Plan   Intervention Prescribe, educate and counsel regarding individualized specific dietary modifications aiming towards targeted core components such as weight, hypertension, lipid management, diabetes, heart failure and other comorbidities.   Expected Outcomes Short Term Goal: A plan has been developed with personal nutrition goals set during dietitian appointment.      Nutrition Discharge:   Education Questionnaire Score:     Knowledge Questionnaire Score - 05/27/16 1421      Knowledge Questionnaire Score   Pre Score 1/10   Post Score 10/10      Goals reviewed with patient; copy given to patient.

## 2016-06-12 NOTE — Progress Notes (Signed)
Pulmonary Individual Treatment Plan  Patient Details  Name: Mita Vallo MRN: [id number] Date of Birth: 05-Feb-1946 Referring Provider:    Initial Encounter Date:    Pulmonary Rehab from 02/18/2016 in [facility] Cardiac [facility] Pulmonary Rehab  Date  02/18/16      Visit Diagnosis: COPD, moderate ([place])  Patient's Home Medications on Admission:  Current Outpatient Prescriptions:  .  acetaminophen (TYLENOL) 325 MG tablet, Take 650 mg by mouth every 6 (six) hours as needed (TAKE 2 TABLETS EVERY 6 HOURS AS NEEDED)., Disp: , Rfl:  .  albuterol (PROVENTIL HFA;VENTOLIN HFA) 108 (90 BASE) MCG/ACT inhaler, Inhale 1 puff into the lungs every 6 (six) hours as needed for wheezing or shortness of breath., Disp: , Rfl:  .  apixaban (ELIQUIS) 5 MG TABS tablet, Take 1 tablet (5 mg total) by mouth 2 (two) times daily., Disp: 60 tablet, Rfl: 5 .  bisoprolol (ZEBETA) 5 MG tablet, Take 0.5 tablets (2.5 mg total) by mouth 2 (two) times daily., Disp: 30 tablet, Rfl: 5 .  budesonide-formoterol (SYMBICORT) 160-4.5 MCG/ACT inhaler, Inhale 2 puffs into the lungs 2 (two) times daily., Disp: , Rfl:  .  Calcium Carbonate-Vitamin D 600-400 MG-UNIT per tablet, Take 1 tablet by mouth daily., Disp: , Rfl:  .  citalopram (CELEXA) 40 MG tablet, , Disp: , Rfl:  .  diltiazem (DILACOR XR) 240 MG 24 hr capsule, , Disp: , Rfl:  .  fluticasone (FLONASE) 50 MCG/ACT nasal spray, Place 1 spray into both nostrils daily as needed for allergies. , Disp: , Rfl:  .  furosemide (LASIX) 20 MG tablet, Take 1 tablet (20 mg total) by mouth 2 (two) times daily., Disp: 30 tablet, Rfl: 5 .  ipratropium (ATROVENT HFA) 17 MCG/ACT inhaler, Inhale 2 puffs into the lungs 4 (four) times daily., Disp: , Rfl:  .  levalbuterol (XOPENEX) 0.63 MG/3ML nebulizer solution, Take 3 mLs (0.63 mg total) by nebulization every 6 (six) hours as needed for wheezing or shortness of breath., Disp: 3 mL, Rfl: 12 .  levothyroxine (SYNTHROID, LEVOTHROID) 150 MCG tablet, Take 150 mcg  by mouth daily before breakfast. , Disp: , Rfl:  .  Multiple Vitamin (MULTIVITAMIN WITH MINERALS) TABS tablet, Take 1 tablet by mouth daily., Disp: , Rfl:  .  potassium chloride SA (K-DUR,KLOR-CON) 20 MEQ tablet, Take 2 tablets (40 mEq total) by mouth 2 (two) times daily., Disp: 30 tablet, Rfl: 0  Past Medical History: Past Medical History:  Diagnosis Date  . A-fib ([place])   . Anxiety   . COPD (chronic obstructive pulmonary disease) ([place])   . Heart disease   . High cholesterol   . Hypertension   . Stroke [facility])     Tobacco Use: History  Smoking Status  . Former Smoker  Smokeless Tobacco  . Not on file    Comment: QUIT SMOKING "13 YEARS AGO"    Labs: Recent Review Flowsheet Data    Labs for ITP Cardiac and Pulmonary Rehab Latest Ref Rng & Units 01/29/2014 01/29/2014 01/30/2014 02/01/2014 02/13/2014   Cholestrol 0 - 200 mg/dL - - 114 - -   LDLCALC 0 - 99 mg/dL - - 70 - -   HDL >39 mg/dL - - 24(L) - -   Trlycerides <150 mg/dL 118 - 102 - -   Hemoglobin A1c <5.7 % - - 6.4(H) - -   PHART 7.350 - 7.450 - 7.282(L) - 7.399 7.297(L)   PCO2ART 35.0 - 45.0 mmHg - 54.5(H) - 42.3 56.0(H)   HCO3 20.0 -  24.0 mEq/L - 24.9(H) - 25.6(H) 27.5(H)   TCO2 0 - 100 mmol/L - 26.6 - 26.9 29   ACIDBASEDEF 0.0 - 2.0 mmol/L - 0.9 - - -   O2SAT % - 91.4 - 97.2 94.0       ADL UCSD:     Pulmonary Assessment Scores    Row Name 02/18/16 1323 04/15/16 1408 06/03/16 1534     ADL UCSD   ADL Phase Entry Mid Exit   SOB Score total 89 57 49   Rest 2 0 0   Walk _0 Stairs _1 Bath _2 Dress _3 Shop _4 mMRC Score   mMRC Score  - 3  entry 4 mMRC  -      Pulmonary Function Assessment:     Pulmonary Function Assessment - 02/18/16 1322      Breath   Shortness of Breath Fear of Shortness of Breath;Limiting activity;Yes      Exercise Target Goals:    Exercise Program Goal: Individual exercise prescription set with THRR, safety & activity barriers. Participant  demonstrates ability to understand and report RPE using BORG scale, to self-measure pulse accurately, and to acknowledge the importance of the exercise prescription.  Exercise Prescription Goal: Starting with aerobic activity 30 plus minutes a day, 3 days per week for initial exercise prescription. Provide home exercise prescription and guidelines that participant acknowledges understanding prior to discharge.  Activity Barriers & Risk Stratification:     Activity Barriers & Cardiac Risk Stratification - 02/18/16 1329      Activity Barriers & Cardiac Risk Stratification   Activity Barriers --  History of a stroke. Left side numbness and tingling when use arm.      6 Minute Walk:     6 Minute Walk    Row Name 02/18/16 1307 02/18/16 1313 05/29/16 1229     6 Minute Walk   Phase  -  - Discharge   Distance 560 feet 560 feet 600 feet   Distance % Change  -  - 7.14 %  40 ft   Walk Time 5.5 minutes 5.5 minutes 4.82 minutes   # of Rest Breaks _5 sec and 1:06   MPH 1.15 1.15 1.41   METS 1.84 1.84 1.02   RPE _6 Perceived Dyspnea  _7 VO2 Peak 3.83 3.83 3.56   Symptoms No No Yes (comment)   Comments  -  - SOB, breathing rapid   Resting HR 93 bpm 93 bpm 60 bpm   Resting BP 136/74 136/74 126/74   Max Ex. HR  - 122 bpm 92 bpm   Max Ex. BP  - 146/68 126/74     Interval HR   Baseline HR  - 100 60   1 Minute HR  - 93 78   2 Minute HR  - 108 85   3 Minute HR  - 108 84   4 Minute HR  - 122  -   5 Minute HR  - 117 84   6 Minute HR  - 100 92   2 Minute Post HR  -  - 77   Interval Heart Rate?  - Yes Yes     Interval Oxygen   Interval Oxygen?  - Yes Yes   Baseline Oxygen Saturation %  - 94 % 93 %  Baseline Liters of Oxygen  - 2 L 2 L   1 Minute Oxygen Saturation %  - 90 % 94 %   1 Minute Liters of Oxygen  - 2 L 2 L   2 Minute Oxygen Saturation %  - 90 % 90 %   2 Minute Liters of Oxygen  - 2 L 2 L   3 Minute Oxygen Saturation %  - 90 % 92 %   3 Minute Liters of  Oxygen  - 2 L 2 L   4 Minute Oxygen Saturation %  - 89 %  -   4 Minute Liters of Oxygen  - 2 L 2 L   5 Minute Oxygen Saturation %  - 95 % 93 %   5 Minute Liters of Oxygen  - 2 L 2 L   6 Minute Oxygen Saturation %  - 86 % 93 %   6 Minute Liters of Oxygen  - 2 L 2 L   2 Minute Post Oxygen Saturation %  -  - 93 %   2 Minute Post Liters of Oxygen  - 2 L 2 L     Oxygen Initial Assessment:     Oxygen Initial Assessment - 04/27/16 0803      Home Oxygen   Home Oxygen Device Portable Concentrator;Home Concentrator   Sleep Oxygen Prescription Continuous   Liters per minute 2   Home Exercise Oxygen Prescription Continuous   Liters per minute 2   Home at Rest Exercise Oxygen Prescription Continuous   Liters per minute 2   Compliance with Home Oxygen Use Yes     Initial 6 min Walk   Oxygen Used Continuous   Liters per minute 2   Resting Oxygen Saturation  during 6 min walk 94 %   Exercise Oxygen Saturation  during 6 min walk 89 %     Program Oxygen Prescription   Program Oxygen Prescription Continuous   Liters per minute 2     Intervention   Short Term Goals To learn and exhibit compliance with exercise, home and travel O2 prescription;To Learn and understand importance of maintaining oxygen saturations>88%;To learn and demonstrate proper use of respiratory medications;To learn and understand importance of monitoring SPO2 with pulse oximeter and demonstrate accurate use of the pulse oximeter.;To learn and demonstrate proper purse lipped breathing techniques or other breathing techniques.   Long  Term Goals Exhibits compliance with exercise, home and travel O2 prescription;Maintenance of O2 saturations>88%;Compliance with respiratory medication;Verbalizes importance of monitoring SPO2 with pulse oximeter and return demonstration;Exhibits proper breathing techniques, such as purse lipped breathing or other method taught during program session;Demonstrates proper use of MDI's       Oxygen Re-Evaluation:     Oxygen Re-Evaluation    Row Name 06/02/16 0651             Program Oxygen Prescription   Program Oxygen Prescription Continuous;E-Tanks       Liters per minute 2         Home Oxygen   Home Oxygen Device Portable Concentrator;Home Concentrator;E-Tanks       Sleep Oxygen Prescription Continuous       Liters per minute 2       Home Exercise Oxygen Prescription Continuous       Liters per minute 2       Home at Rest Exercise Oxygen Prescription Continuous       Liters per minute 2       Compliance with Home Oxygen Use  Yes         Goals/Expected Outcomes   Comments Ms Wence has a good understanding of her oxygen and maintains 2l/m for acceptable oxygen saturations.       Goals/Expected Outcomes Continue using oxygen as ordered and self-monitor with her finger oximeter.          Oxygen Discharge (Final Oxygen Re-Evaluation):     Oxygen Re-Evaluation - 06/02/16 0651      Program Oxygen Prescription   Program Oxygen Prescription Continuous;E-Tanks   Liters per minute 2     Home Oxygen   Home Oxygen Device Portable Concentrator;Home Concentrator;E-Tanks   Sleep Oxygen Prescription Continuous   Liters per minute 2   Home Exercise Oxygen Prescription Continuous   Liters per minute 2   Home at Rest Exercise Oxygen Prescription Continuous   Liters per minute 2   Compliance with Home Oxygen Use Yes     Goals/Expected Outcomes   Comments Ms Yinger has a good understanding of her oxygen and maintains 2l/m for acceptable oxygen saturations.   Goals/Expected Outcomes Continue using oxygen as ordered and self-monitor with her finger oximeter.      Initial Exercise Prescription:     Initial Exercise Prescription - 02/18/16 1300      Date of Initial Exercise RX and Referring Provider   Date 02/18/16     Oxygen   Oxygen Continuous   Liters 2     NuStep   Level 1   Minutes 15   METs 1.6     REL-XR   Level 1   Minutes 15   METs 1.6      Track   Laps 15   Minutes 15     Prescription Details   Frequency (times per week) 3   Duration Progress to 45 minutes of aerobic exercise without signs/symptoms of physical distress     Intensity   THRR 40-80% of Max Heartrate 115-137   Ratings of Perceived Exertion 11-13     Progression   Progression Continue to progress workloads to maintain intensity without signs/symptoms of physical distress.     Resistance Training   Training Prescription Yes   Weight 1   Reps 10-15      Perform Capillary Blood Glucose checks as needed.  Exercise Prescription Changes:     Exercise Prescription Changes    Row Name 02/24/16 1200 03/13/16 1300 03/18/16 1500 04/01/16 1500 04/14/16 1500     Response to Exercise   Blood Pressure (Admit) 126/74  - 128/62 122/64 122/70   Blood Pressure (Exercise) 132/76  - 130/74 136/72 130/80   Blood Pressure (Exit) 122/84  - 112/64 112/60 130/76   Heart Rate (Admit) 95 bpm  - 56 bpm 63 bpm 56 bpm   Heart Rate (Exercise) 108 bpm  - 71 bpm 70 bpm 75 bpm   Heart Rate (Exit) 105 bpm  - 56 bpm 86 bpm 55 bpm   Oxygen Saturation (Admit) 94 %  - 96 % 94 % 95 %   Oxygen Saturation (Exercise) 88 %  - 92 % 96 % 93 %   Oxygen Saturation (Exit) 96 %  - 95 % 98 % 97 %   Rating of Perceived Exertion (Exercise) 13  - _0 Perceived Dyspnea (Exercise) 2  - _1 Symptoms  - none none none none   Comments  - Home Exercise Guidelines given 03/13/16 Home Exercise Guidelines given 03/13/16 Home Exercise Guidelines given 03/13/16 Home Exercise  Guidelines given 03/13/16   Duration Progress to 45 minutes of aerobic exercise without signs/symptoms of physical distress Progress to 45 minutes of aerobic exercise without signs/symptoms of physical distress Progress to 45 minutes of aerobic exercise without signs/symptoms of physical distress Progress to 45 minutes of aerobic exercise without signs/symptoms of physical distress Continue with 45 min of aerobic exercise without  signs/symptoms of physical distress.   Intensity _0      Progression   Progression Continue to progress workloads to maintain intensity without signs/symptoms of physical distress. Continue to progress workloads to maintain intensity without signs/symptoms of physical distress. Continue to progress workloads to maintain intensity without signs/symptoms of physical distress. Continue to progress workloads to maintain intensity without signs/symptoms of physical distress. Continue to progress workloads to maintain intensity without signs/symptoms of physical distress.   Average METs  -  - 1.9 1.93 1.7     Resistance Training   Training Prescription _1    Weight 1 1 yellow band 2 lbs 2 lbs   Reps  -  - 10-15 10-15 10-15     Interval Training   Interval Training  -  - No No No     Oxygen   Oxygen _2    Liters _3 3L on treadmill     Treadmill   MPH  -  -  - 0.9 0.8   Grade  -  -  - 0 0   Minutes  -  -  - 15 15  continuous   METs  -  -  - 1.7 1.7     NuStep   Level _4 Minutes _5 METs 1.8 1.8 2  77 spm 2.3  71 spm 2.3     REL-XR   Level  -  - _6 Minutes  -  - _7 METs  -  - 1.9  38 spm 1.8 1.8     Biostep-RELP   Level 1 1  -  -  -   Minutes 15 15  -  -  -   METs 2 2  -  -  -     Track   Minutes  -  - 5  -  -     Home Exercise Plan   Plans to continue exercise at  - Home  walking and stationary bike Home  walking and stationary bike Home  walking and stationary bike Home (comment)  walking and stationary bike   Frequency  - Add 1 additional day to program exercise sessions. Add 1 additional day to program exercise sessions. Add 1 additional day to program exercise sessions. Add 1 additional day to program exercise sessions.   Initial Home Exercises Provided  -  -  -  - 03/13/16     Exercise  Review   Progression  -  - Yes Yes  -   Row Name 04/28/16 1600 05/13/16 1500 05/27/16 1400 06/10/16 1400       Response to Exercise   Blood Pressure (Admit) 132/74 118/66 112/66 104/62    Blood Pressure (Exercise) 134/70 136/70 140/70 124/68    Blood Pressure (Exit) 126/70 124/64 118/64 120/70    Heart Rate (Admit) 60 bpm 54 bpm 62 bpm 82 bpm    Heart Rate (Exercise) 75 bpm 76  bpm 66 bpm 78 bpm    Heart Rate (Exit) 59 bpm 58 bpm 61 bpm 83 bpm    Oxygen Saturation (Admit) 91 % 96 % 93 % 95 %    Oxygen Saturation (Exercise) 93 % 93 % 94 % 94 %    Oxygen Saturation (Exit) 97 % 92 % 96 % 95 %    Rating of Perceived Exertion (Exercise) _0 Perceived Dyspnea (Exercise) _1 Symptoms SOB on treadmill SOB on treadmill SOB on treadmill SOB on treadmill    Duration Continue with 45 min of aerobic exercise without signs/symptoms of physical distress. Continue with 45 min of aerobic exercise without signs/symptoms of physical distress. Continue with 45 min of aerobic exercise without signs/symptoms of physical distress. Continue with 45 min of aerobic exercise without signs/symptoms of physical distress.    Intensity THRR unchanged THRR unchanged THRR unchanged THRR unchanged      Progression   Progression Continue to progress workloads to maintain intensity without signs/symptoms of physical distress. Continue to progress workloads to maintain intensity without signs/symptoms of physical distress. Continue to progress workloads to maintain intensity without signs/symptoms of physical distress. Continue to progress workloads to maintain intensity without signs/symptoms of physical distress.    Average METs 1.76 1.76 1.74 1.88      Resistance Training   Training Prescription Yes Yes Yes Yes    Weight 2 lbs/green band Green band 2 lbs 2 lbs    Reps 10-15 10-15 10-15 10-15      Interval Training   Interval Training No No No No      Oxygen   Oxygen Continuous Continuous Continuous  Continuous    Liters _2 Treadmill   MPH _3 Grade 0 0 0.5 0.5    Minutes _4 METs 1.77 1.77 1.83 1.83      NuStep   Level _5 SPM  - 71 91 86    Minutes _6 METs 2.3 2 1.9 2      REL-XR   Level _7 Watts  -  -  - 35    Minutes _8 METs 1.4 1.5  46 rpm 1.8 1.8      Home Exercise Plan   Plans to continue exercise at Home (comment)  walking and stationary bike Home (comment)  walking and stationary bike Home (comment)  walking and stationary bike Home (comment)  walking and stationary bike    Frequency Add 2 additional days to program exercise sessions. Add 2 additional days to program exercise sessions. Add 2 additional days to program exercise sessions. Add 2 additional days to program exercise sessions.    Initial Home Exercises Provided 03/13/16 03/13/16 03/13/16 03/13/16       Exercise Comments:     Exercise Comments    Row Name 02/24/16 1257 03/13/16 1331 03/18/16 1515 03/25/16 1318 04/01/16 1537   Exercise Comments  First full day of exercise!  Patient was oriented to gym and equipment including functions, settings, policies, and procedures.  Patient's individual exercise prescription and treatment plan were reviewed.  All starting workloads were established based on the results of the 6 minute walk test done at initial orientation visit.  The plan for exercise progression  was also introduced and progression will be customized based on patient's performance and goals. Reviewed home exercise with pt today.  Pt plans to walk and use stationary bike at home for exercise.  Reviewed THR, pulse, RPE, sign and symptoms, and when to call 911 or MD.  Also discussed weather considerations and indoor options.  Pt voiced understanding. Shonya is off to a good start with exercise.  She needs lots of encouragement to keep moving.  She is able to do 15 min on each machine, but does need rest when walking.  She often gets side  tracked and will stop exercisng to talk.  We will continue to monitor her progression and keep her moving. Tanith went 7 min on the treadmill today!! Pasty has been doing much better in rehab.  She is doing better following along during weights! Today she was able to do 10 min on the treadmill total!  We will continue to monitor her progression.   [place] Name 04/10/16 1330 06/12/16 1258         Exercise Comments 15 min on treadmill again today!! Peggi graduated today from cardiac rehab with 36 sessions completed.  Details of the patient's exercise prescription and what She needs to do in order to continue the prescription and progress were discussed with patient.  Patient was given a copy of prescription and goals.  Patient verbalized understanding.  Chandler plans to continue to exercise by coming to [employer].         Exercise Goals and Review:     Exercise Goals    Row Name 04/28/16 1643             Exercise Goals   Increase Physical Activity Yes       Intervention Provide advice, education, support and counseling about physical activity/exercise needs.;Develop an individualized exercise prescription for aerobic and resistive training based on initial evaluation findings, risk stratification, comorbidities and participant's personal goals.       Expected Outcomes Achievement of increased cardiorespiratory fitness and enhanced flexibility, muscular endurance and strength shown through measurements of functional capacity and personal statement of participant.       Increase Strength and Stamina Yes       Intervention Provide advice, education, support and counseling about physical activity/exercise needs.;Develop an individualized exercise prescription for aerobic and resistive training based on initial evaluation findings, risk stratification, comorbidities and participant's personal goals.       Expected Outcomes Achievement of increased cardiorespiratory fitness and enhanced  flexibility, muscular endurance and strength shown through measurements of functional capacity and personal statement of participant.          Exercise Goals Re-Evaluation :     Exercise Goals Re-Evaluation    Row Name 04/14/16 1548 04/15/16 1403 04/17/16 1238 04/28/16 1644 05/13/16 1546     Exercise Goal Re-Evaluation   Exercise Goals Review Increase Physical Activity;Increase Strenth and Stamina Increase Physical Activity;Increase Strenth and Stamina Increase Strenth and Stamina;Increase Physical Activity Increase Physical Activity;Increase Strenth and Stamina Increase Physical Activity;Increase Strenth and Stamina   Comments Evaline is coming up on her half way point.  She is up to 15 minutes on the treadmill now!  Her stamina is really improving.  She also getting through class by herself now, no longer needing her daughter or as much help from the staff.  We will continue to monitor her progression. Lyvonne improved her walk test by 125 ft!! Mallisa was able to change the sheets on her bed yesterday!!  Dahiana has been working hard in rehab.  She is now mostly independent doing weights.  She is also up to level 3 on the XR and 1.23mh on treadmill.  We will continue to monitor her progression.  -   Expected Outcomes Short: Rether will start to increase her speed on treadmill.  Long: Continue to come to classes to work on iMicrobiologistand Long: Keep coming to class to work on strength and stamina to become more independent Short and Long: Keep coming to class to work on strength and stamina to become more independent Short: Continue to work on iLawyer  Long: Increase strength and stamina for independence!!  -   Row Name 05/13/16 1547 05/15/16 1236 05/27/16 1426 06/10/16 1452       Exercise Goal Re-Evaluation   Exercise Goals Review Increase Physical Activity;Increase Strenth and Stamina Increase Strenth and Stamina Increase Strenth and Stamina;Increase Physical  Activity Increase Strenth and Stamina;Increase Physical Activity    Comments Takeyah returned today after being out for a week.  She has been doing well in rehab.  She is now using the green band with weights!  We will continue to monitor her progress. Angala increased her treadmill to 0.5% grade!! PNicahas been doing well in rehab.  She will be graduating soon and needs her post 6MWT done!  She was able to walk with grade on the treadmill.  We will continue to monitor her progression. Saretta will be graduating on Friday.  She improved her walk test by 7%! She was able to walk faster than her initial test but not as long.  She is planning to continue to exercise with [place] joining our [place] way since the beginning of the program.  At the start, she could not even get out of the chair or do weights by herself.  Now she is able to get out the chair and go weigh heself and then come back and participate in resistance training independently.    Expected Outcomes Short: Add incline to her treadmill.  Long: Use her silver sneakers to maintain her home exercise and possibly look into FDillard's Short and Long: Continue to improve strength and stamina by coming to rehab. Short: Complete post 6MWT.   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       Discharge Exercise Prescription (Final Exercise Prescription Changes):     Exercise Prescription Changes - 06/10/16 1400      Response to Exercise   Blood Pressure (Admit) 104/62   Blood Pressure (Exercise) 124/68   Blood Pressure (Exit) 120/70   Heart Rate (Admit) 82 bpm   Heart Rate (Exercise) 78 bpm   Heart Rate (Exit) 83 bpm   Oxygen Saturation (Admit) 95 %   Oxygen Saturation (Exercise) 94 %   Oxygen Saturation (Exit) 95 %   Rating of Perceived Exertion (Exercise) 11    Perceived Dyspnea (Exercise) 1   Symptoms SOB on treadmill   Duration Continue with 45 min of aerobic exercise without signs/symptoms of physical distress.   Intensity THRR unchanged     Progression   Progression Continue to progress workloads to maintain intensity without signs/symptoms of physical distress.   Average METs 1.88     Resistance Training   Training Prescription Yes   Weight 2  lbs   Reps 10-15     Interval Training   Interval Training No     Oxygen   Oxygen Continuous   Liters 2     Treadmill   MPH 1   Grade 0.5   Minutes 15   METs 1.83     NuStep   Level 4   SPM 86   Minutes 15   METs 2     REL-XR   Level 3   Watts 35   Minutes 15   METs 1.8     Home Exercise Plan   Plans to continue exercise at Home (comment)  walking and stationary bike   Frequency Add 2 additional days to program exercise sessions.   Initial Home Exercises Provided 03/13/16      Nutrition:  Target Goals: Understanding of nutrition guidelines, daily intake of sodium <1565m, cholesterol <2028m calories 30% from fat and 7% or less from saturated fats, daily to have 5 or more servings of fruits and vegetables.  Biometrics:     Pre Biometrics - 02/18/16 1306      Pre Biometrics   Height _0  (1.575 m)   Weight 193 lb 14.4 oz (88 kg)   Waist Circumference 44.5 inches   Hip Circumference 43.5 inches   Waist to Hip Ratio 1.02 %   BMI (Calculated) 35.5       Nutrition Therapy Plan and Nutrition Goals:     Nutrition Therapy & Goals - 02/18/16 1311      Intervention Plan   Intervention Prescribe, educate and counsel regarding individualized specific dietary modifications aiming towards targeted core components such as weight, hypertension, lipid management, diabetes, heart failure and other comorbidities.   Expected Outcomes Short Term Goal: A plan has been developed with personal nutrition goals set during dietitian appointment.      Nutrition Discharge: Rate  Your Plate Scores:   Nutrition Goals Re-Evaluation:     Nutrition Goals Re-Evaluation    Row Name 03/20/16 1322             Goals   Current Weight 193 lb (87.5 kg)         Intervention Plan   Intervention Continue to educate, counsel and set short/long term goals regarding individualized specific personal dietary modifications.       Comments Ms. LoDonaoes not state any personal nutrition goals at this time.  She states her appetite is normal and that her neighbor brings her meals often.  She is willing to meet with the dietician in an effort to help meet her weight management goal.  But appointment should be made with her daughter.            Nutrition Goals Discharge (Final Nutrition Goals Re-Evaluation):     Nutrition Goals Re-Evaluation - 03/20/16 1322      Goals   Current Weight 193 lb (87.5 kg)     Intervention Plan   Intervention Continue to educate, counsel and set short/long term goals regarding individualized specific personal dietary modifications.   Comments Ms. LoPatchenoes not state any personal nutrition goals at this time.  She states her appetite is normal and that her neighbor brings her meals often.  She is willing to meet with the dietician in an effort to help meet her weight management goal.  But appointment should be made with her daughter.        Psychosocial: Target Goals: Acknowledge presence or absence of significant depression and/or stress, maximize coping skills, provide  positive support system. Participant is able to verbalize types and ability to use techniques and skills needed for reducing stress and depression.   Initial Review & Psychosocial Screening:     Initial Psych Review & Screening - 02/18/16 1314      Initial Review   Current issues with Current Psychotropic Meds     Family Dynamics   Good Support System? Yes  Daughter and other children     Barriers   Psychosocial barriers to participate in program There are no identifiable  barriers or psychosocial needs.;The patient should benefit from training in stress management and relaxation.     Screening Interventions   Interventions Encouraged to exercise      Quality of Life Scores:     Quality of Life - 06/03/16 1533      Quality of Life Scores   Health/Function Pre 21 %   Health/Function Post 15.2 %   Health/Function % Change -27.62 %   Socioeconomic Pre 18.33 %   Socioeconomic Post 27.6 %   Socioeconomic % Change  50.57 %   Psych/Spiritual Pre 21 %   Psych/Spiritual Post 18 %   Psych/Spiritual % Change -14.29 %   Family Pre 21 %   Family Post 18 %   Family % Change -14.29 %   GLOBAL Pre 20.5 %   GLOBAL Post 18.19 %   GLOBAL % Change -11.27 %      PHQ-9: Recent Review Flowsheet Data    Depression screen [facility] 2/9 06/03/2016 02/18/2016   Decreased Interest 1 2   Down, Depressed, Hopeless 0 1   PHQ - 2 Score 1 3   Altered sleeping 0 2   Tired, decreased energy 3 3   Change in appetite 2 3   Feeling bad or failure about yourself  0 3   Trouble concentrating 0 3   Moving slowly or fidgety/restless 0 3   Suicidal thoughts 0 0   PHQ-9 Score 6 20   Difficult doing work/chores Not difficult at all Extremely dIfficult     Interpretation of Total Score  Total Score Depression Severity:  1-4 = Minimal depression, 5-9 = Mild depression, 10-14 = Moderate depression, 15-19 = Moderately severe depression, 20-27 = Severe depression   Psychosocial Evaluation and Intervention:     Psychosocial Evaluation - 06/03/16 1220      Discharge Psychosocial Assessment & Intervention   Comments Counselor met with Ms. Margolis today for discharge evaluation from this program.  She reports having benefitted tremendously from this program by being able to do more around the house; such as vacuuming and sweeping. She reports more energy and sleeping well.  Javonna also states her mood has improved significantly since coming into this program and she is typically more positive.   She continues to have a strong support system with her daughters and a son locally.  She plans to join the [employer]" program as a follow up upon discharge from Pulmonary Rehab.  Counselor commended Ms. Shanker on her hard work and progress made while here and her commitment to consistently exercising to manage her health condition.      Psychosocial Re-Evaluation:     Psychosocial Re-Evaluation    [place] Name 02/28/16 1027 03/20/16 1324 04/15/16 1401         Psychosocial Re-Evaluation   Current issues with  -  - None Identified     Comments Chyna called and said she is sorry that she can't make [facility] today since  she is sick.  Altheia denies extra stress in her life right now and denies any difficulty sleeping.  She does endorse some anxiety, over "small everyday things" like when her clothes get twisted up while dressing or when she gets tangled in her O2 tubing.  We discussed using some of the relaxation and deep breathing techniques she is learning in [place].  She lives alone has wonderful support from her 2 daughters who help her keep up with her medications and medical appointments.  A neighbor brings her meals often.   Patient psychosocial assessment reveals no barriers to participation in Pulmonary Rehab. There are no phsychosocial areas that are currently affecting patient's rehab experience.Patient does continue to exhibit positive coping skills to deal with her psychosocial concerns. Offered emotional support and reassurance. Patient does feel she is making progress toward Pulmonary Rehab goals. Patient reports her health and activity level has improved in the past 30 days as evidenced by patient's report of increased ability to to more around the house like make a peach cobbler. Patient states family/friends have noticed changes in her activity or mood. Patient reports feeling positive about current and projected progression in Pulmonary Rehab. After reviewing the patient's  treatment plan, the patient is making progress toward Pulmonary Rehab goals. Patient's rate of progress toward rehab goals is good. Plan of action to help patient continue to work towards rehab goals include continuing to come to exercise and education classes. Will continue to monitor and evaluate progress toward psychosocial goal(s).     Expected Outcomes  -  - Short: Continue to come to exercise and education classes. Long: Able to do more at home on her own.     Interventions  - Encouraged to attend Pulmonary Rehabilitation for the exercise;Relaxation education Encouraged to attend Pulmonary Rehabilitation for the exercise;Relaxation education     Continue Psychosocial Services   - Yes Follow up required by staff        Psychosocial Discharge (Final Psychosocial Re-Evaluation):     Psychosocial Re-Evaluation - 04/15/16 1401      Psychosocial Re-Evaluation   Current issues with None Identified   Comments Patient psychosocial assessment reveals no barriers to participation in Pulmonary Rehab. There are no phsychosocial areas that are currently affecting patient's rehab experience.Patient does continue to exhibit positive coping skills to deal with her psychosocial concerns. Offered emotional support and reassurance. Patient does feel she is making progress toward Pulmonary Rehab goals. Patient reports her health and activity level has improved in the past 30 days as evidenced by patient's report of increased ability to to more around the house like make a peach cobbler. Patient states family/friends have noticed changes in her activity or mood. Patient reports feeling positive about current and projected progression in Pulmonary Rehab. After reviewing the patient's treatment plan, the patient is making progress toward Pulmonary Rehab goals. Patient's rate of progress toward rehab goals is good. Plan of action to help patient continue to work towards rehab goals include continuing to come to exercise  and education classes. Will continue to monitor and evaluate progress toward psychosocial goal(s).   Expected Outcomes Short: Continue to come to exercise and education classes. Long: Able to do more at home on her own.   Interventions Encouraged to attend Pulmonary Rehabilitation for the exercise;Relaxation education   Continue Psychosocial Services  Follow up required by staff      Education: Education Goals: Education classes will be provided on a weekly basis, covering required topics. Participant will state understanding/return  demonstration of topics presented.  Learning Barriers/Preferences:     Learning Barriers/Preferences - 02/18/16 1321      Learning Barriers/Preferences   Learning Barriers None   Learning Preferences None      Education Topics: Initial Evaluation Education: - Verbal, written and demonstration of respiratory meds, RPE/PD scales, oximetry and breathing techniques. Instruction on use of nebulizers and MDIs: cleaning and proper use, rinsing mouth with steroid doses and importance of monitoring MDI activations.   Pulmonary Rehab from 06/12/2016 in [facility] Cardiac and Pulmonary Rehab  Date  02/18/16  Educator  Sb  Instruction Review Code  2- meets goals/outcomes      General Nutrition Guidelines/Fats and Fiber: -Group instruction provided by verbal, written material, models and posters to present the general guidelines for heart healthy nutrition. Gives an explanation and review of dietary fats and fiber.   Pulmonary Rehab from 06/12/2016 in [facility] Cardiac and Pulmonary Rehab  Date  04/20/16  Educator  CR  Instruction Review Code  2- meets goals/outcomes      Controlling Sodium/Reading Food Labels: -Group verbal and written material supporting the discussion of sodium use in heart healthy nutrition. Review and explanation with models, verbal and written materials for utilization of the food label.   Pulmonary Rehab from 06/12/2016 in [facility] Cardiac and Pulmonary  Rehab  Date  05/04/16  Educator  CR  Instruction Review Code  2- meets goals/outcomes      Exercise Physiology & Risk Factors: - Group verbal and written instruction with models to review the exercise physiology of the cardiovascular system and associated critical values. Details cardiovascular disease risk factors and the goals associated with each risk factor.   Pulmonary Rehab from 06/12/2016 in [facility] Cardiac and Pulmonary Rehab  Date  05/22/16  Educator  [place]  Instruction Review Code  2- meets goals/outcomes      Aerobic Exercise & Resistance Training: - Gives group verbal and written discussion on the health impact of inactivity. On the components of aerobic and resistive training programs and the benefits of this training and how to safely progress through these programs.   Flexibility, Balance, General Exercise Guidelines: - Provides group verbal and written instruction on the benefits of flexibility and balance training programs. Provides general exercise guidelines with specific guidelines to those with heart or lung disease. Demonstration and skill practice provided.   Pulmonary Rehab from 06/12/2016 in [facility] Cardiac and Pulmonary Rehab  Date  04/10/16  Educator  AS  Instruction Review Code  2- meets goals/outcomes      Stress Management: - Provides group verbal and written instruction about the health risks of elevated stress, cause of high stress, and healthy ways to reduce stress.   Pulmonary Rehab from 06/12/2016 in [facility] Cardiac and Pulmonary Rehab  Date  05/20/16  Educator  [facility]  Instruction Review Code  2- meets goals/outcomes      Depression: - Provides group verbal and written instruction on the correlation between heart/lung disease and depressed mood, treatment options, and the stigmas associated with seeking treatment.   Pulmonary Rehab from 06/12/2016 in [facility] Cardiac and Pulmonary Rehab  Date  04/22/16  Educator  [facility]  Instruction Review Code  2- meets  goals/outcomes      Exercise & Equipment Safety: - Individual verbal instruction and demonstration of equipment use and safety with use of the equipment.   Pulmonary Rehab from 06/12/2016 in [facility] Cardiac and Pulmonary Rehab  Date  02/24/16  Educator  AS  Instruction Review Code  2-  meets goals/outcomes      Infection Prevention: - Provides verbal and written material to individual with discussion of infection control including proper hand washing and proper equipment cleaning during exercise session.   Pulmonary Rehab from 06/12/2016 in [facility] Cardiac and Pulmonary Rehab  Date  02/24/16  Educator  AS  Instruction Review Code  2- meets goals/outcomes      Falls Prevention: - Provides verbal and written material to individual with discussion of falls prevention and safety.   Pulmonary Rehab from 06/12/2016 in [facility] Cardiac and Pulmonary Rehab  Date  02/18/16  Educator  SB  Instruction Review Code  2- meets goals/outcomes      Diabetes: - Individual verbal and written instruction to review signs/symptoms of diabetes, desired ranges of glucose level fasting, after meals and with exercise. Advice that pre and post exercise glucose checks will be done for 3 sessions at entry of program.   Chronic Lung Diseases: - Group verbal and written instruction to review new updates, new respiratory medications, new advancements in procedures and treatments. Provide informative websites and "800" numbers of self-education.   Pulmonary Rehab from 06/12/2016 in [facility] Cardiac and Pulmonary Rehab  Date  05/27/16  Educator  LB  Instruction Review Code  2- meets goals/outcomes      Lung Procedures: - Group verbal and written instruction to describe testing methods done to diagnose lung disease. Review the outcome of test results. Describe the treatment choices: Pulmonary Function Tests, ABGs and oximetry.   Energy Conservation: - Provide group verbal and written instruction for methods to conserve  energy, plan and organize activities. Instruct on pacing techniques, use of adaptive equipment and posture/positioning to relieve shortness of breath.   Pulmonary Rehab from 06/12/2016 in [facility] Cardiac and Pulmonary Rehab  Date  04/15/16  Educator  [facility]  Instruction Review Code  2- meets goals/outcomes      Triggers: - Group verbal and written instruction to review types of environmental controls: home humidity, furnaces, filters, dust mite/pet prevention, HEPA vacuums. To discuss weather changes, air quality and the benefits of nasal washing.   Pulmonary Rehab from 06/12/2016 in [facility] Cardiac and Pulmonary Rehab  Date  06/10/16  Educator  LB  Instruction Review Code  2- meets goals/outcomes      Exacerbations: - Group verbal and written instruction to provide: warning signs, infection symptoms, calling MD promptly, preventive modes, and value of vaccinations. Review: effective airway clearance, coughing and/or vibration techniques. Create an Sports administrator.   Pulmonary Rehab from 06/12/2016 in [facility] Cardiac and Pulmonary Rehab  Date  06/03/16  Educator  LB  Instruction Review Code  2- meets goals/outcomes      Oxygen: - Individual and group verbal and written instruction on oxygen therapy. Includes supplement oxygen, available portable oxygen systems, continuous and intermittent flow rates, oxygen safety, concentrators, and Medicare reimbursement for oxygen.   Respiratory Medications: - Group verbal and written instruction to review medications for lung disease. Drug class, frequency, complications, importance of spacers, rinsing mouth after steroid MDI's, and proper cleaning methods for nebulizers.   AED/CPR: - Group verbal and written instruction with the use of models to demonstrate the basic use of the AED with the basic ABC's of resuscitation.   Pulmonary Rehab from 06/12/2016 in [facility] Cardiac and Pulmonary Rehab  Date  06/12/16  Educator  [place]  Instruction Review Code  2- meets  goals/outcomes      Breathing Retraining: - Provides individuals verbal and written instruction on purpose, frequency, and proper  technique of diaphragmatic breathing and pursed-lipped breathing. Applies individual practice skills.   Pulmonary Rehab from 06/12/2016 in [facility] Cardiac and Pulmonary Rehab  Date  02/24/16  Educator  AS  Instruction Review Code  2- meets goals/outcomes      Anatomy and Physiology of the Lungs: - Group verbal and written instruction with the use of models to provide basic lung anatomy and physiology related to function, structure and complications of lung disease.   Heart Failure: - Group verbal and written instruction on the basics of heart failure: signs/symptoms, treatments, explanation of ejection fraction, enlarged heart and cardiomyopathy.   Pulmonary Rehab from 06/12/2016 in [facility] Cardiac and Pulmonary Rehab  Date  04/03/16  Educator  CE  Instruction Review Code  2- meets goals/outcomes      Sleep Apnea: - Individual verbal and written instruction to review Obstructive Sleep Apnea. Review of risk factors, methods for diagnosing and types of masks and machines for OSA.   Anxiety: - Provides group, verbal and written instruction on the correlation between heart/lung disease and anxiety, treatment options, and management of anxiety.   Pulmonary Rehab from 06/12/2016 in [facility] Cardiac and Pulmonary Rehab  Date  05/20/16  Educator  [facility]  Instruction Review Code  2- Meets goals/outcomes      Relaxation: - Provides group, verbal and written instruction about the benefits of relaxation for patients with heart/lung disease. Also provides patients with examples of relaxation techniques.   Pulmonary Rehab from 06/12/2016 in [facility] Cardiac and Pulmonary Rehab  Date  03/25/16  Educator  [facility]  Instruction Review Code  2- Meets goals/outcomes      Knowledge Questionnaire Score:     Knowledge Questionnaire Score - 05/27/16 1421      Knowledge Questionnaire  Score   Pre Score 1/10   Post Score 10/10       Core Components/Risk Factors/Patient Goals at Admission:     Personal Goals and Risk Factors at Admission - 02/18/16 1311      Core Components/Risk Factors/Patient Goals on Admission    Weight Management Obesity;Weight Maintenance;Yes   Admit Weight 193 lb 14.4 oz (88 kg)   Goal Weight: Short Term 190 lb (86.2 kg)   Goal Weight: Long Term 160 lb (72.6 kg)   Expected Outcomes Short Term: Continue to assess and modify interventions until short term weight is achieved;Long Term: Adherence to nutrition and physical activity/exercise program aimed toward attainment of established weight goal;Weight Loss: Understanding of general recommendations for a balanced deficit meal plan, which promotes 1-2 lb weight loss per week and includes a negative energy balance of [phone] kcal/d   Sedentary Yes   Intervention Provide advice, education, support and counseling about physical activity/exercise needs.;Develop an individualized exercise prescription for aerobic and resistive training based on initial evaluation findings, risk stratification, comorbidities and participant's personal goals.   Expected Outcomes Achievement of increased cardiorespiratory fitness and enhanced flexibility, muscular endurance and strength shown through measurements of functional capacity and personal statement of participant.   Increase Strength and Stamina Yes  Not able to be active without SOB stopping her   Intervention Provide advice, education, support and counseling about physical activity/exercise needs.;Develop an individualized exercise prescription for aerobic and resistive training based on initial evaluation findings, risk stratification, comorbidities and participant's personal goals.   Expected Outcomes Achievement of increased cardiorespiratory fitness and enhanced flexibility, muscular endurance and strength shown through measurements of functional capacity and  personal statement of participant.   Improve shortness of breath with ADL's Yes  Intervention Provide education, individualized exercise plan and daily activity instruction to help decrease symptoms of SOB with activities of daily living.   Expected Outcomes Short Term: Achieves a reduction of symptoms when performing activities of daily living.   Develop more efficient breathing techniques such as purse lipped breathing and diaphragmatic breathing; and practicing self-pacing with activity Yes  Demonstrated purse lipped breathing today without prompt.   Intervention Provide education, demonstration and support about specific breathing techniuqes utilized for more efficient breathing. Include techniques such as pursed lipped breathing, diaphragmatic breathing and self-pacing activity.   Expected Outcomes Short Term: Participant will be able to demonstrate and use breathing techniques as needed throughout daily activities.   Hypertension Yes   Intervention Provide education on lifestyle modifcations including regular physical activity/exercise, weight management, moderate sodium restriction and increased consumption of fresh fruit, vegetables, and low fat dairy, alcohol moderation, and smoking cessation.;Monitor prescription use compliance.   Expected Outcomes Short Term: Continued assessment and intervention until BP is < 140/76m HG in hypertensive participants. < 130/838mHG in hypertensive participants with diabetes, heart failure or chronic kidney disease.;Long Term: Maintenance of blood pressure at goal levels.   Lipids Yes   Intervention Provide education and support for participant on nutrition & aerobic/resistive exercise along with prescribed medications to achieve LDL <7029mHDL >42m81m Expected Outcomes Short Term: Participant states understanding of desired cholesterol values and is compliant with medications prescribed. Participant is following exercise prescription and nutrition  guidelines.;Long Term: Cholesterol controlled with medications as prescribed, with individualized exercise RX and with personalized nutrition plan. Value goals: LDL < 70mg95mL > 40 mg.      Core Components/Risk Factors/Patient Goals Review:      Goals and Risk Factor Review    Row Name 03/20/16 1328 04/06/16 1223 06/02/16 0624         Core Components/Risk Factors/Patient Goals Review   Personal Goals Review Weight Management/Obesity;Increase Strength and Stamina;Develop more efficient breathing techniques such as purse lipped breathing and diaphragmatic breathing and practicing self-pacing with activity.;Improve shortness of breath with ADL's;Increase knowledge of respiratory medications and ability to use respiratory devices properly.;Hypertension;Lipids Weight Management/Obesity;Sedentary;Increase Strength and Stamina;Develop more efficient breathing techniques such as purse lipped breathing and diaphragmatic breathing and practicing self-pacing with activity.;Improve shortness of breath with ADL's;Lipids;Hypertension;Increase knowledge of respiratory medications and ability to use respiratory devices properly.  -     Review Khiley is overall pleased with her progress in [facility] so far.  She has not been able to attend as regularly as she would like due to illnesses, as she relies on her daughter to bring her to class.  However, she has noticed that she is more motivated to get up and move around the house and has increased strength to do so, and decreased shortness of breath during ADL's.  She is effectively using pursed lip breathing, but states " I sometimes forget".  We discussed using PLB prior to getting in trouble with her breathing.  She continues to use 2L O2 n/c at home, and 3L O2 when [facility] is knowledgeable about her respiratory medications.  She reports that she tries not to use her rescue inhaler often, because it "gets my a-fib going".  Her blood pressures in [facility]  are WNL and she states she takes her medications as prescribed.    Patient stated that she has maintained her weight and SOB seems to currently be about the same. She does feel comfortable with PLB to help control SOB.  She stated that she does feel a little bit stronger than before and she is now able to do some house work, which before she said she didn't do. Blood pressure and lipid panels were reported as good.  Ms Bielak be graduating soon. She has increased her stamina and conditioning and can now be more independent at home. She is doing her laundry, vacuuming, and some cooking. She plans to continue her exercise in [employer] for both her  exercise and socialization. We reviewed her inhalers again to make sure she has a good understanding of them. She is independent with her oxygen. Attending on a regular basis and enjoying education, Ms Baray is commended for her participation in [place].     Expected Outcomes Continue to attend [facility] regularly.   Continue to regularly attend [facility] and work towards further progression of these goals.  Continue to exercise in [employer], be active at home, and continue self-management of her COPD as learned in [place].        Core Components/Risk Factors/Patient Goals at Discharge (Final Review):      Goals and Risk Factor Review - 06/02/16 0624      Core Components/Risk Factors/Patient Goals Review   Review Ms Drzewiecki be graduating soon. She has increased her stamina and conditioning and can now be more independent at home. She is doing her laundry, vacuuming, and some cooking. She plans to continue her exercise in [employer] for both her  exercise and socialization. We reviewed her inhalers again to make sure she has a good understanding of them. She is independent with her oxygen. Attending on a regular basis and enjoying education, Ms Mcdiarmid is commended for her participation in [place].   Expected Outcomes Continue to exercise in [employer],  be active at home, and continue self-management of her COPD as learned in [place].      ITP Comments:     ITP Comments    Row Name 02/18/16 1309 02/26/16 0934 03/18/16 0844 03/20/16 1137 05/13/16 1546   ITP Comments Initial Med review completed today ITP created fro Dr Elta Guadeloupe Sabra Heck to sign. Records sent for scanning Ms Taulbee's daughter called and states Ms Mcgovern has a stomach virus and will not be able to attend [facility] today or possibly Friday. Ms Tabak's daughter called to inform us that she has the flu and will not be able to bring Ms Mccaughey to [place] until possibly Friday. Attended [employer] [place] returned today after being out for a week while her daughter (her transportation) was out of town.      Comments: Discharge ITP

## 2016-06-18 ENCOUNTER — Encounter: Payer: Self-pay | Admitting: Emergency Medicine

## 2016-06-18 ENCOUNTER — Emergency Department
Admission: EM | Admit: 2016-06-18 | Discharge: 2016-06-19 | Disposition: A | Discharge status: Home / Self Care | Payer: Medicare PPO | Attending: Student in an Organized Health Care Education/Training Program | Admitting: Student in an Organized Health Care Education/Training Program

## 2016-06-18 ENCOUNTER — Emergency Department: Payer: Medicare PPO

## 2016-06-18 DIAGNOSIS — J449 Chronic obstructive pulmonary disease, unspecified: Secondary | ICD-10-CM | POA: Insufficient documentation

## 2016-06-18 DIAGNOSIS — R202 Paresthesia of skin: Secondary | ICD-10-CM

## 2016-06-18 DIAGNOSIS — R200 Anesthesia of skin: Secondary | ICD-10-CM | POA: Diagnosis present

## 2016-06-18 DIAGNOSIS — Z87891 Personal history of nicotine dependence: Secondary | ICD-10-CM | POA: Insufficient documentation

## 2016-06-18 DIAGNOSIS — I10 Essential (primary) hypertension: Secondary | ICD-10-CM | POA: Diagnosis not present

## 2016-06-18 DIAGNOSIS — G459 Transient cerebral ischemic attack, unspecified: Secondary | ICD-10-CM | POA: Insufficient documentation

## 2016-06-18 DIAGNOSIS — R410 Disorientation, unspecified: Secondary | ICD-10-CM

## 2016-06-18 LAB — DIFFERENTIAL
Basophils Absolute: 0.1 10*3/uL (ref 0–0.1)
Basophils Relative: 1 %
Eosinophils Absolute: 0.1 10*3/uL (ref 0–0.7)
Eosinophils Relative: 1 %
Lymphocytes Relative: 20 %
Lymphs Abs: 2.6 10*3/uL (ref 1.0–3.6)
Monocytes Absolute: 1.2 10*3/uL — ABNORMAL HIGH (ref 0.2–0.9)
Monocytes Relative: 10 %
Neutro Abs: 8.6 10*3/uL — ABNORMAL HIGH (ref 1.4–6.5)
Neutrophils Relative %: 68 %

## 2016-06-18 LAB — URINALYSIS, COMPLETE (UACMP) WITH MICROSCOPIC
Bacteria, UA: NONE SEEN
Bilirubin Urine: NEGATIVE
Glucose, UA: NEGATIVE mg/dL
Hgb urine dipstick: NEGATIVE
Ketones, ur: NEGATIVE mg/dL
Nitrite: NEGATIVE
Protein, ur: NEGATIVE mg/dL
RBC / HPF: NONE SEEN RBC/hpf (ref 0–5)
Specific Gravity, Urine: 1.013 (ref 1.005–1.030)
pH: 5 (ref 5.0–8.0)

## 2016-06-18 LAB — CBC
HCT: 45.6 % (ref 35.0–47.0)
Hemoglobin: 15.1 g/dL (ref 12.0–16.0)
MCH: 31 pg (ref 26.0–34.0)
MCHC: 33.2 g/dL (ref 32.0–36.0)
MCV: 93.4 fL (ref 80.0–100.0)
Platelets: 280 10*3/uL (ref 150–440)
RBC: 4.88 MIL/uL (ref 3.80–5.20)
RDW: 13.7 % (ref 11.5–14.5)
WBC: 12.6 10*3/uL — ABNORMAL HIGH (ref 3.6–11.0)

## 2016-06-18 LAB — PROTIME-INR
INR: 1.2
Prothrombin Time: 15.3 seconds — ABNORMAL HIGH (ref 11.4–15.2)

## 2016-06-18 LAB — COMPREHENSIVE METABOLIC PANEL
ALT: 30 U/L (ref 14–54)
AST: 39 U/L (ref 15–41)
Albumin: 4.1 g/dL (ref 3.5–5.0)
Alkaline Phosphatase: 58 U/L (ref 38–126)
Anion gap: 7 (ref 5–15)
BUN: 22 mg/dL — ABNORMAL HIGH (ref 6–20)
CO2: 25 mmol/L (ref 22–32)
Calcium: 9.3 mg/dL (ref 8.9–10.3)
Chloride: 106 mmol/L (ref 101–111)
Creatinine, Ser: 1.06 mg/dL — ABNORMAL HIGH (ref 0.44–1.00)
GFR calc Af Amer: 60 mL/min — ABNORMAL LOW (ref >60)
GFR calc non Af Amer: 52 mL/min — ABNORMAL LOW (ref >60)
Glucose, Bld: 95 mg/dL (ref 65–99)
Potassium: 4.1 mmol/L (ref 3.5–5.1)
Sodium: 138 mmol/L (ref 135–145)
Total Bilirubin: 1.1 mg/dL (ref 0.3–1.2)
Total Protein: 7.5 g/dL (ref 6.5–8.1)

## 2016-06-18 LAB — TROPONIN I: Troponin I: <0.03 ng/mL (ref <0.03)

## 2016-06-18 LAB — APTT: aPTT: 38 seconds — ABNORMAL HIGH (ref 24–36)

## 2016-06-18 MED ORDER — LORAZEPAM 1 MG PO TABS
1.0000 mg | ORAL_TABLET | Freq: Once | ORAL | Status: AC
  Administered 2016-06-18: 1 mg via ORAL
  Filled 2016-06-18: qty 1

## 2016-06-18 NOTE — ED Notes (Signed)
ED Provider at bedside. 

## 2016-06-18 NOTE — ED Triage Notes (Signed)
Pt to triage via [facility] w/ daughter, daughter reports visited pt and found her confused, numbness to left side of face at around 7pm tonight.  Last known well is yesterday.  Pt denies pain.  Daughter report hx of stroke 2 years ago with lasting left sided weakness.  PT alert and oriented to self, place, disoriented to time and situation.  Pt w/ decreased sensation to left side of face, uneveness in smile noted.  No slurred speech

## 2016-06-18 NOTE — ED Provider Notes (Signed)
[facility] Emergency Department Provider Note    None    (approximate)  I have reviewed the triage vital signs and the nursing notes.   HISTORY  Chief Complaint Numbness    HPI Summer Bradley is a 71 y.o. 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 Last known well was yesterday., Daughter on the porch and at that point wasn't confused. Since she was a brief episode of numbness and tingling on the left side consistent with previous.  States that since being in the ER symptoms have improved. She is currently alert and oriented 3. Denies any weakness. Denies any significant discomfort.  In triage the patient had left facial droop that was noted with decreased sensation on the left side of her face. Initially in triage apparently she was disoriented to time and the situation.   Past Medical History:  Diagnosis Date  . A-fib ([place])   . Anxiety   . COPD (chronic obstructive pulmonary disease) ([place])   . Heart disease   . High cholesterol   . Hypertension   . Stroke [facility])    Family History  Problem Relation Age of Onset  . Diabetes Mother    Past Surgical History:  Procedure Laterality Date  . ABDOMINAL SURGERY    . COLONOSCOPY N/A 03/03/2014   Procedure: COLONOSCOPY;  Surgeon: Inda Castle, MD;  Location: [place];  Service: Endoscopy;  Laterality: N/A;  . ENTEROSCOPY N/A 03/03/2014   Procedure: ENTEROSCOPY;  Surgeon: Inda Castle, MD;  Location: [facility] ENDOSCOPY;  Service: Endoscopy;  Laterality: N/A;  . ESOPHAGOGASTRODUODENOSCOPY N/A 03/01/2014   Procedure: ESOPHAGOGASTRODUODENOSCOPY (EGD);  Surgeon: Inda Castle, MD;  Location: [place];  Service: Endoscopy;  Laterality: N/A;  . RADIOLOGY WITH ANESTHESIA N/A 01/29/2014   Procedure: RADIOLOGY WITH ANESTHESIA;   Surgeon: Luanne Bras, MD;  Location: [place];  Service: Radiology;  Laterality: N/A;  . RADIOLOGY WITH ANESTHESIA N/A 02/01/2014   Procedure: RADIOLOGY WITH ANESTHESIA;  Surgeon: Rob Hickman, MD;  Location: [place] NEURO ORS;  Service: Radiology;  Laterality: N/A;  . TRACHEOSTOMY  02/15/14   feinstein   Patient Active Problem List   Diagnosis Date Noted  . Pulmonary emphysema ([place]) 04/25/2015  . Emphysema of lung ([place]) 05/09/2014  . 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[place]) 05/09/2014  . Hyperlipidemia 05/09/2014  . GI bleed   . Benign neoplasm of colon 03/03/2014  . Diverticulosis of colon without hemorrhage 03/03/2014  . 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 Jejunal ulcer 03/03/2014  . Gastric polyps 03/03/2014  . Acute posthemorrhagic anemia 03/02/2014  . Melena 02/27/2014  . LGI bleed   . Acute on chronic respiratory failure ([place]) 02/16/2014  . 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 Aspiration pneumonitis ([place])   . Spontaneous pneumothorax   . Thrush   . COPD exacerbation ([place])   . SOB (shortness of breath)   . HLD (hyperlipidemia)   . Essential hypertension   . Dysphagia, pharyngoesophageal phase   . Acute tracheobronchitis 02/04/2014  . Atelectasis of left lung 02/04/2014  . Wheezing   . Acute respiratory failure ([place])   . Paroxysmal atrial fibrillation (H[facility])   . Cerebral infarction due to occlusion or stenosis of  precerebral artery ([place])   . 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 Stroke ([place]) 01/29/2014  . Respiratory failure ([place]) 01/29/2014      Prior to Admission medications   Medication Sig Start Date End Date Taking? Authorizing Provider  acetaminophen (TYLENOL) 325 MG tablet Take 650 mg by mouth every 6 (six) hours as needed (TAKE 2 TABLETS EVERY 6 HOURS AS NEEDED).    Historical Provider, MD  albuterol  (PROVENTIL HFA;VENTOLIN HFA) 108 (90 BASE) MCG/ACT inhaler Inhale 1 puff into the lungs every 6 (six) hours as needed for wheezing or shortness of breath.    Historical Provider, MD  apixaban (ELIQUIS) 5 MG TABS tablet Take 1 tablet (5 mg total) by mouth 2 (two) times daily. 03/07/14   Rosalin Hawking, MD  bisoprolol (ZEBETA) 5 MG tablet Take 0.5 tablets (2.5 mg total) by mouth 2 (two) times daily. 03/05/14   Rosalin Hawking, MD  budesonide-formoterol (SYMBICORT) 160-4.5 MCG/ACT inhaler Inhale 2 puffs into the lungs 2 (two) times daily.    Historical Provider, MD  Calcium Carbonate-Vitamin D 600-400 MG-UNIT per tablet Take 1 tablet by mouth daily.    Historical Provider, MD  citalopram (CELEXA) 40 MG tablet  07/04/14   Historical Provider, MD  diltiazem (DILACOR XR) 240 MG 24 hr capsule  09/25/14   Historical Provider, MD  fluticasone (FLONASE) 50 MCG/ACT nasal spray Place 1 spray into both nostrils daily as needed for allergies.  12/06/13   Historical Provider, MD  furosemide (LASIX) 20 MG tablet Take 1 tablet (20 mg total) by mouth 2 (two) times daily. 03/05/14   Rosalin Hawking, MD  ipratropium (ATROVENT HFA) 17 MCG/ACT inhaler Inhale 2 puffs into the lungs 4 (four) times daily.    Historical Provider, MD  levalbuterol Penne Lash) 0.63 MG/3ML nebulizer solution Take 3 mLs (0.63 mg total) by nebulization every 6 (six) hours as needed for wheezing or shortness of breath. 03/05/14   Rosalin Hawking, MD  levothyroxine (SYNTHROID, LEVOTHROID) 150 MCG tablet Take 150 mcg by mouth daily before breakfast.  01/09/14   Historical Provider, MD  Multiple Vitamin (MULTIVITAMIN WITH MINERALS) TABS tablet Take 1 tablet by mouth daily.    Historical Provider, MD  potassium chloride SA (K-DUR,KLOR-CON) 20 MEQ tablet Take 2 tablets (40 mEq total) by mouth 2 (two) times daily. 03/05/14   Rosalin Hawking, MD    Allergies Lisinopril    Social History Social History  Substance Use Topics  . Smoking status: Former Research scientist (life sciences)  . Smokeless tobacco:  Never Used     Comment: QUIT SMOKING "13 YEARS AGO"  . Alcohol use No    Review of Systems Patient denies headaches, rhinorrhea, blurry vision, numbness, shortness of breath, chest pain, edema, cough, abdominal pain, nausea, vomiting, diarrhea, dysuria, fevers, rashes or hallucinations unless otherwise stated above in HPI. ____________________________________________   PHYSICAL EXAM:  VITAL SIGNS: Vitals:   06/18/16 1959  BP: (!) 145/72  Pulse: (!) 56  Resp: 20  Temp: 98.5 F (36.9 C)    Constitutional: Alert and oriented. in no acute distress. Eyes: Conjunctivae are normal. PERRL. EOMI. Head: Atraumatic. Nose: No congestion/rhinnorhea. Mouth/Throat: Mucous membranes are moist.  Oropharynx non-erythematous. Neck: No stridor. Painless ROM. No cervical spine tenderness to palpation Hematological/Lymphatic/Immunilogical: No cervical lymphadenopathy. Cardiovascular: Normal rate, regular rhythm. Grossly normal heart sounds.  Good peripheral circulation. Respiratory: Normal respiratory effort.  No retractions. Lungs with coarse bibasilar breathsounds Gastrointestinal: Soft and nontender. No distention. No abdominal bruits. No CVA tenderness. Musculoskeletal: No lower extremity  tenderness nor edema.  No joint effusions. Neurologic:  Normal speech and language. No gross focal neurologic deficits are appreciated. No facial droop.  CN intact, no drift, SILT bilaterally.  Normal FNF and heel to shin Skin:  Skin is warm, dry and intact. No rash noted. Psychiatric: Mood and affect are normal. Speech and behavior are normal.  ____________________________________________   LABS (all labs ordered are listed, but only abnormal results are displayed)  Results for orders placed or performed during the hospital encounter of 06/18/16 (from the past 24 hour(s))  Protime-INR     Status: Abnormal   Collection Time: 06/18/16  8:12 PM  Result Value Ref Range   Prothrombin Time 15.3 (H) 11.4 -  15.2 seconds   INR 1.20   APTT     Status: Abnormal   Collection Time: 06/18/16  8:12 PM  Result Value Ref Range   aPTT 38 (H) 24 - 36 seconds  CBC     Status: Abnormal   Collection Time: 06/18/16  8:12 PM  Result Value Ref Range   WBC 12.6 (H) 3.6 - 11.0 K/uL   RBC 4.88 3.80 - 5.20 MIL/uL   Hemoglobin 15.1 12.0 - 16.0 g/dL   HCT 45.6 35.0 - 47.0 %   MCV 93.4 80.0 - 100.0 fL   MCH 31.0 26.0 - 34.0 pg   MCHC 33.2 32.0 - 36.0 g/dL   RDW 13.7 11.5 - 14.5 %   Platelets 280 150 - 440 K/uL  Differential     Status: Abnormal   Collection Time: 06/18/16  8:12 PM  Result Value Ref Range   Neutrophils Relative % 68 %   Neutro Abs 8.6 (H) 1.4 - 6.5 K/uL   Lymphocytes Relative 20 %   Lymphs Abs 2.6 1.0 - 3.6 K/uL   Monocytes Relative 10 %   Monocytes Absolute 1.2 (H) 0.2 - 0.9 K/uL   Eosinophils Relative 1 %   Eosinophils Absolute 0.1 0 - 0.7 K/uL   Basophils Relative 1 %   Basophils Absolute 0.1 0 - 0.1 K/uL  Comprehensive metabolic panel     Status: Abnormal   Collection Time: 06/18/16  8:12 PM  Result Value Ref Range   Sodium 138 135 - 145 mmol/L   Potassium 4.1 3.5 - 5.1 mmol/L   Chloride 106 101 - 111 mmol/L   CO2 25 22 - 32 mmol/L   Glucose, Bld 95 65 - 99 mg/dL   BUN 22 (H) 6 - 20 mg/dL   Creatinine, Ser 1.06 (H) 0.44 - 1.00 mg/dL   Calcium 9.3 8.9 - 10.3 mg/dL   Total Protein 7.5 6.5 - 8.1 g/dL   Albumin 4.1 3.5 - 5.0 g/dL   AST 39 15 - 41 U/L   ALT 30 14 - 54 U/L   Alkaline Phosphatase 58 38 - 126 U/L   Total Bilirubin 1.1 0.3 - 1.2 mg/dL   GFR calc non Af Amer 52 (L) >60 mL/min   GFR calc Af Amer 60 (L) >60 mL/min   Anion gap 7 5 - 15  Troponin I     Status: None   Collection Time: 06/18/16  8:12 PM  Result Value Ref Range   Troponin I <0.03 <0.03 ng/mL  Urinalysis, Complete w Microscopic     Status: Abnormal   Collection Time: 06/18/16  9:31 PM  Result Value Ref Range   Color, Urine YELLOW (A) YELLOW   APPearance CLEAR (A) CLEAR   Specific Gravity, Urine  1.013 1.005 - 1.030  pH 5.0 5.0 - 8.0   Glucose, UA NEGATIVE NEGATIVE mg/dL   Hgb urine dipstick NEGATIVE NEGATIVE   Bilirubin Urine NEGATIVE NEGATIVE   Ketones, ur NEGATIVE NEGATIVE mg/dL   Protein, ur NEGATIVE NEGATIVE mg/dL   Nitrite NEGATIVE NEGATIVE   Leukocytes, UA TRACE (A) NEGATIVE   RBC / HPF NONE SEEN 0 - 5 RBC/hpf   WBC, UA 0-5 0 - 5 WBC/hpf   Bacteria, UA NONE SEEN NONE SEEN   Squamous Epithelial / LPF 0-5 (A) NONE SEEN   Mucous PRESENT    Hyaline Casts, UA PRESENT    ____________________________________________  EKG My review and personal interpretation at Time: 20:08   Indication: tia  Rate: 55  Rhythm: sinus Axis:normal.  Other: normal intervals, no acute ischemia ____________________________________________  RADIOLOGY  I personally reviewed all radiographic images ordered to evaluate for the above acute complaints and reviewed radiology reports and findings.  These findings were personally discussed with the patient.  Please see medical record for radiology report.  ____________________________________________   PROCEDURES  Procedure(s) performed:  Procedures    Critical Care performed: no ____________________________________________   INITIAL IMPRESSION / ASSESSMENT AND PLAN / ED COURSE  Pertinent labs & imaging results that were available during my care of the patient were reviewed by me and considered in my medical decision making (see chart for details).  DDX: cva, tia, hypoglycemia, dehydration, electrolyte abnormality, dissection, sepsis   Haeli Gerlich is a 71 y.o. who presents to the ED with Description of is as described above. Patient afebrile and hemodynamic stable. Blood work is reassuring. CT imaging ordered emergently shows no acute abnormalities. Symptoms resolved in the ER therefore more concerning for TIA. Patient is alert he medically optimized on L [place]. Will order MRI to further characterize for any TIA. Assuming patient has no  significant ischemia on MRI do feel patient would be stable for close follow-up as she is already established with neurology.  Patient will be signed out to Dr. Owens Shark pending MRI.      ____________________________________________   FINAL CLINICAL IMPRESSION(S) / ED DIAGNOSES  Final diagnoses:  Paresthesia  Confusion      NEW MEDICATIONS STARTED DURING THIS VISIT:  New Prescriptions   No medications on file     Note:  This document was prepared using Dragon voice recognition software and may include unintentional dictation errors.    Merlyn Lot, MD 06/18/16 [phone]

## 2016-06-18 NOTE — ED Notes (Signed)
Daughter to desk to inquire over wait time and her concerns about possible stroke; daughter assured that her protocols have been completed and we are awaiting an available exam room; daughter expresses concern about checking pt's urine; pt taken to triage by Altha Harm, RN for urine specimen; triage nurse reports pt's last known normal was yesterday

## 2016-06-18 NOTE — ED Notes (Signed)
Per Dr. Alfred Levins, do not call code stroke at this time

## 2016-06-19 ENCOUNTER — Emergency Department: Payer: Medicare PPO

## 2016-06-19 NOTE — ED Notes (Signed)
Sarah EDT to accompany pt and her daughter to MRI.

## 2016-06-19 NOTE — ED Notes (Signed)
ED Provider at bedside. 

## 2016-06-19 NOTE — ED Notes (Signed)
Pt r/f MRI 

## 2016-06-19 NOTE — ED Notes (Signed)
[place] MRT talking to Eagle MD about the possibility that this patient has aneurysm hardware that could complicate or prevent MRI tonight.

## 2016-07-15 ENCOUNTER — Ambulatory Visit (INDEPENDENT_AMBULATORY_CARE_PROVIDER_SITE_OTHER): Payer: Medicare PPO | Admitting: Neurology

## 2016-07-15 ENCOUNTER — Encounter: Payer: Self-pay | Admitting: Neurology

## 2016-07-15 VITALS — BP 124/71 | HR 52 | Ht 62.0 in | Wt 193.0 lb

## 2016-07-15 DIAGNOSIS — J439 Emphysema, unspecified: Secondary | ICD-10-CM | POA: Diagnosis not present

## 2016-07-15 DIAGNOSIS — I6312 Cerebral infarction due to embolism of basilar artery: Secondary | ICD-10-CM | POA: Diagnosis not present

## 2016-07-15 DIAGNOSIS — I63411 Cerebral infarction due to embolism of right middle cerebral artery: Secondary | ICD-10-CM | POA: Diagnosis not present

## 2016-07-15 DIAGNOSIS — E785 Hyperlipidemia, unspecified: Secondary | ICD-10-CM

## 2016-07-15 DIAGNOSIS — I480 Paroxysmal atrial fibrillation: Secondary | ICD-10-CM | POA: Diagnosis not present

## 2016-07-15 NOTE — Progress Notes (Signed)
STROKE NEUROLOGY FOLLOW UP NOTE  NAME: Summer Bradley DOB: 07/10/1945  REASON FOR VISIT: stroke follow up HISTORY FROM: chart and daughter  Today we had the pleasure of seeing Summer Bradley in follow-up at our Neurology Clinic. Pt was accompanied by daughter.   History Summary Summer Bradley is a 71 y.o. female with history of hypertension, CVA, A. fib and COPD was admitted on 01/29/2014 for left-sided weakness. In the ER, a CT was obtained which shows evidence of a previous stroke, and a CT perfusion scan was obtained which shows a significant mismatch demonstrating an area of penumbra, though there is an smaller area of likely infarct core as well. Patient was not administered TPA secondary to being outside of the tPA window but she was taken to neuro intervention where she had complete revascularization of RT MCA M1 with TICI 3 flow reesablished. Afterwards, she was admitted to the neuro ICU. MRI showed moderate size right MCA infarct therefore her anticoagulation was on hold to avoid hemorrhagic transformation. 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 IR consulted again for mechanical thrombectomy and was able to extract the clot, and the patient has sustained a right pontine and midbrain stroke, small bilateral cerebellar strokes, and right PCA infarcts. She was intubated for the procedure and had prolonged ventilation due to difficulty off vent. Her neuro status improved quickly and eliquis was started. After extubation, she was transferred to floor. However, she still has respiratory distress and had aspiration pneumonia and again was intubated and transferred to ICU. Trach and PEG performed 02/15/14. She was stabilized and neuro stable and off the vent. CCM following pt with trach and eventually she was able to tolerate with trach collar and capping, she was off trach one week prior to discharge. Unfortunately, she developed melena and eliquis  stopped. 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 She was s/p APC. She did not need blood transfusion and H&H stable throughout. As per GI, her eliquis can be cautiously resumed. She was put on eliquis 2.5mg  bid for two days and then started full dose eliquis 5mg  bid on 03/07/14. She also developed delirium and sun downing, she was put on seroquel Qhs and Xananx tid with versed PRN. On discharge, she was continued on 12.5mg  Qhs seroquel, but Xanax and versed discontinued and resumed her home medication of clonazepam 0.5mg  PRN . Her respiratory issue much better, on nebulizations. CXR no consolidations. She was transferred to SNF in stable condition.   05/09/14 follow-up - the patient had dramatic improvement. She is awake alert, quite talkative, in good spirit, still on nasal cannular with coughing, but neurologically she was intact except left hemianopia. She is ready to go home this Friday from nursing home. She is able to walk with walker independently. Her G-tube was put on by her one week ago, currently the wound heals well. She is able to eat without difficulty. She still on Eliquis for stroke prevention. Also on seroquel 12.5 at night with clonazepam when necessary for anxiety. Blood pressure in clinic today 117/66.  10/08/14 follow up - patient has been doing well, able to walk without any walker or cane this time. She has been discharged to home from SNF. Continued on eliquis 5mg  bid without difficulties. Visual field deficit on the left since has significant improvement.  04/25/15 follow up - pt has been doing well from stroke standpoint. No recurrent stroke like symptoms. However, she has had 2 falls since  last visit. One time her left leg buckled and she fell face down and still has scar at forehead. The other one was on 03/23/15, she fell out of bed during sleep and needed stitches at vertex of her head. CT head and C-spine negative. She is on eliquis 5mg  bid, compliant. She  still on home O2, 2L/min. Complains of cough and sputum. BP stable at home, today 110/65.   Interval History During the interval time, pt has been doing well without stroke like symptoms. She had ED visit on 06/18/16 due to confusion on the phone. She was not orientated to place people or situation. Episode lasted about one hour and resolved. In ER work up all negative. She had 2 falls for the last 2 months, each time she was sleeping and acting out from dream. BP 129/71 today.   REVIEW OF SYSTEMS: Full 14 system review of systems performed and notable only for those listed below and in HPI above, all others are negative:  Constitutional: appetite change Cardiovascular:  Ear/Nose/Throat:  Runny nose Skin:  Eyes:  Eye discharge Respiratory:  Wheezing, cough, SOB Gastroitestinal:   Genitourinary:  Hematology/Lymphatic:  Bruise easily Endocrine: excessive eating Musculoskeletal:   Allergy/Immunology:  Food allergy Neurological:   Psychiatric: agitation, confusion, nervous/anxious Sleep:   The following represents the patient's updated allergies and side effects list: Allergies  Allergen Reactions  . Lisinopril     Unknown reaction    The neurologically relevant items on the patient's problem list were reviewed on today's visit.  Neurologic Examination  A problem focused neurological exam (12 or more points of the single system neurologic examination, vital signs counts as 1 point, cranial nerves count for 8 points) was performed.  Blood pressure 124/71, pulse (!) 52, height 5\' 2"  (1.575 m), weight 193 lb (87.5 kg).  General - Well nourished, well developed, in mild respiratory distress with portable O2 tank.  Ophthalmologic - fundi not visualized due to frequent eye movement.  Cardiovascular - Regular rate and rhythm, no irregular rhythm.  Mental Status -  Level of arousal and orientation to time, place, and person were intact. Language including expression, naming,  repetition, comprehension was assessed and found intact. Fund of Knowledge was assessed and was intact.  Cranial Nerves II - XII - II - Visual field testing showed patchy left lower quadrant visual field deficit. III, IV, VI - Extraocular movements intact. V - Facial sensation intact bilaterally. VII - Facial movement testing showed mild left facial droop. VIII - Hearing & vestibular intact bilaterally. X - Palate elevates symmetrically. XI - Chin turning & shoulder shrug intact bilaterally. XII - Tongue protrusion intact.  Motor Strength - The patient's strength was normal in all extremities and pronator drift was absent.  Bulk was normal and fasciculations were absent.   Motor Tone - Muscle tone was assessed at the neck and appendages and was normal.  Reflexes - The patient's reflexes were normal in all extremities and she had no pathological reflexes.  Sensory - Light touch, temperature/pinprick were assessed and were normal.    Coordination - The patient had normal movements in the hands with no ataxia or dysmetria.  Tremor was absent.  Gait and Station - slow, stooped posturing, walk without cane or walker.  Data reviewed: I personally reviewed the images and agree with the radiology interpretations.  Ct Head Wo Contrast  02/01/2014 Stat CT scan shows no acute changes in the brain, but there appears to be a thrombus in the basilar artery  that is new.  01/30/2014 1. Resolved right MCA territory hyperdensity present at 1056 hrs yesterday, most likely was post Neurointervention parenchymal contrast staining. 2. Mild cytotoxic edema suspected in that same right MCA territory. Underlying chronic ischemic changes. 3. Stable parafalcine subdural hematoma. No new intracranial hemorrhage or acute mass effect. 4. No new intracranial abnormality.  01/29/2014 Acute infarct right MCA territory as noted on CT perfusion study. There is interval development of high density in the right  temporoparietal cortex which may be due to contrast material or hemorrhage. Continued follow-up recommended. Small interhemispheric subdural hematoma is unchanged.  01/29/2014 1. 4 mm thick posterior falx subdural hemorrhage. 2. Prominent density at the right M1/M2 junction. CTA is already planned. 3. Established infarcts in the anterior and posterior right MCA territory. No definitive acute infarct. 4. Left frontal scalp contusion. No acute fracture.   Ct Cerebral Perfusion W/cm 01/29/2014 1. Occlusive thrombus at the right M1-M2 junction. Subjectively good pial/pial collateral circulation. 2. Perfusion findings suggest moderate acute infarct in the central right MCA territory with rim of penumbra, as above. 3. Remote infarcts at the anterior and posterior margins of the right MCA territory. 4. Thin posterior falx subdural hemorrhage.   Cerebral Angiogram  01/29/2014 S/P Bilateral common carotid arteriograms, followed by complete revascularization of RT MCA M1 occlusion usingX2 passes with the Solitaire FR 37mm x 77mm retrieval device And 10 mg of superselective intracranial IA Integrelin. TICI 3 flow reesablished.   MRI brain  02/02/14 1. New, acute infarcts in the posterior circulation related to treated basilar thrombosis. Infarcts present in the bilateral cerebellum, upper right brainstem, mesial right temporal lobe, and minimally along the right occipital cortex. Normal flow voids within the intracranial vessels. 2. Stable appearance of recent right MCA territory infarct. 3. Stable trace falcine subdural hemorrhage.  01/31/14 1. Acute nonhemorrhagic infarct involving the superior right temporal lobe, right insular ribbon, right frontal operculum, and posterior right frontal lobe. 2. More remote infarcts are again noted in the more anterior inferior right frontal lobe and the right parietal lobe. 3. Extensive white matter changes bilaterally likely reflect the sequela of  chronic microvascular ischemia, advanced for age. 4. White matter changes extend into the brainstem. 5. Remote tender infarcts of the cerebellum bilaterally, worse on the right.  Dg Chest Port 1 View 02/08/2014 1. Right PICC line stable position. 2. Congestive heart failure with interstitial edema and small right pleural effusion. Mild improvement from prior exam. 02/10/2014 Improved bibasilar atelectasis versus airspace disease. 02/13/2014 - No pneumothorax status post bilateral chest tube placement. Stable support apparatus 02/19/2014- Resolved left basilar pneumothorax. Bilateral hazy airspace disease not significantly changed 03/05/2014 - Findings felt to represent a degree of congestive heart failure superimposed on appearing emphysematous change. No airspace consolidation. No pneumothorax.  2D Echocardiogram EF 50-55% with no source of embolus.   Small bowel enteroscopy -  1. jejunal AVM 2. Jejunal ulcer 3. Nonbleeding gastric polyps  Colonoscopy -  1. Mild diverticulosis was noted in the sigmoid colon 2. Internal hemorrhoids 3. nonbleeding: Polyp 4. The examination was otherwise normal no obvious colonic bleeding source  Endoscopy - Normal appearing esophagus and GE junction, the stomach was well visualized and normal in appearance, normal appearing duodenum  CT head and C-spine 03/23/15 1. No evidence of acute intracranial or cervical spine injury. 2. Left parietal scalp laceration without fracture. 3. Remote right MCA and posterior circulation infarcts, as above.  Ct Head Wo Contrast 06/18/2016 IMPRESSION: No acute intracranial process. Stable examination including old  RIGHT MCA and PCA territory infarcts, old small RIGHT cerebellar infarcts.    Mr Brain Wo Contrast 06/19/2016 IMPRESSION: 1. No acute intracranial infarct or other process identified. 2. Remote right MCA and PCA territory infarct infarcts, with additional remote bilateral cerebellar and remote left  thalamic lacunar infarct. 3. Age-related cerebral atrophy with underlying chronic microvascular ischemic disease.    Component     Latest Ref Rng 01/30/2014  Cholesterol     0 - 200 mg/dL 114  Triglycerides     <150 mg/dL 102  HDL Cholesterol     >39 mg/dL 24 (L)  Total CHOL/HDL Ratio      4.8  VLDL     0 - 40 mg/dL 20  LDL (calc)     0 - 99 mg/dL 70  Hemoglobin A1C     <5.7 % 6.4 (H)  Mean Plasma Glucose     <117 mg/dL 137 (H)    Assessment: As you may recall, she is a 70 y.o. Caucasian female with PMH of hypertension, CVA, A. fib and COPD was admitted on 01/29/2014 for right MCA stroke. She did not receive TPA due to on window but received mechanical thrombectomy with complete re-vascularization. 2 days later, she developed basilar artery occlusion which required again mechanical thrombectomy. MRI showed right moderate right MCA stroke with posterior stroke including right PCA, right brainstem, and bilateral cerebellum. She had complicated and extensive hospitalization with multiple combinations including aspiration pneumonia requiring intubation and tracheostomy, dysphagia requiring PEG tube placement, GI bleeding requiring endoscopy and colonoscopy and APC procedure. Eventually she was off trach, restarted Eliquis, and stabilize before discharge to SNF. She made a dramatic recovery in SNF and at home, and currently she only had patchy left lower quadrant visual field deficit and mild left hand dexterity difficulty. She is able to walk without walker or cane at this time. Continue Eliquis and statin for stroke prevention. She had intermittent falls, recommend to walk with cane.  Plan:  - Continue Eliquis 5 mg bid and lovastatin for stroke prevention - check BP at home - Follow up with your primary care physician for stroke risk factor modification. Recommend maintain blood pressure goal <130/80, diabetes with hemoglobin A1c goal below 6.5% and lipids with LDL cholesterol goal below 70  mg/dL.  - recommend to walk with cane to avoid fall. - not feel you are ready for driving yet.  - follow up with pulmonologist as scheduled.  - self exercise with left shoulder  - follow up in one year with me.  I spent more than 25 minutes of face to face time with the patient. Greater than 50% of time was spent in counseling and coordination of care. We discussed about walk with cane to avoid fall, and follow up with PCP, as well as self exercise.   No orders of the defined types were placed in this encounter.   Meds ordered this encounter  Medications  . SPIRIVA RESPIMAT 1.25 MCG/ACT AERS    Patient Instructions  - Continue Eliquis 5 mg bid and lovastatin for stroke prevention - check BP at home - Follow up with your primary care physician for stroke risk factor modification. Recommend maintain blood pressure goal <130/80, diabetes with hemoglobin A1c goal below 6.5% and lipids with LDL cholesterol goal below 70 mg/dL.  - recommend to walk with cane to avoid fall. - not feel you are ready for driving yet.  - follow up with pulmonologist as scheduled.  - self exercise with left  shoulder  - follow up in one year with me.   Rosalin Hawking, MD PhD [facility] [address] [place], [place] [address] [phone]

## 2016-07-15 NOTE — Patient Instructions (Addendum)
-   Continue Eliquis 5 mg bid and lovastatin for stroke prevention - check BP at home - Follow up with your primary care physician for stroke risk factor modification. Recommend maintain blood pressure goal <130/80, diabetes with hemoglobin A1c goal below 6.5% and lipids with LDL cholesterol goal below 70 mg/dL.  - recommend to walk with cane to avoid fall. - not feel you are ready for driving yet.  - follow up with pulmonologist as scheduled.  - self exercise with left shoulder  - follow up in one year with me.

## 2016-08-24 ENCOUNTER — Ambulatory Visit: Payer: Medicare PPO | Admitting: Neurology

## 2016-11-03 ENCOUNTER — Other Ambulatory Visit: Payer: Self-pay | Admitting: Internal Medicine

## 2016-11-03 DIAGNOSIS — R0602 Shortness of breath: Secondary | ICD-10-CM

## 2016-11-03 DIAGNOSIS — J449 Chronic obstructive pulmonary disease, unspecified: Secondary | ICD-10-CM

## 2016-11-10 ENCOUNTER — Ambulatory Visit: Payer: Medicare PPO

## 2016-12-09 ENCOUNTER — Other Ambulatory Visit: Payer: Self-pay | Admitting: Family Medicine

## 2016-12-09 DIAGNOSIS — Z1231 Encounter for screening mammogram for malignant neoplasm of breast: Secondary | ICD-10-CM

## 2017-02-22 ENCOUNTER — Encounter: Payer: Self-pay | Admitting: Emergency Medicine

## 2017-02-22 ENCOUNTER — Emergency Department: Payer: No Typology Code available for payment source

## 2017-02-22 ENCOUNTER — Emergency Department
Admission: EM | Admit: 2017-02-22 | Discharge: 2017-02-22 | Disposition: A | Discharge status: Home / Self Care | Payer: No Typology Code available for payment source | Attending: Emergency Medicine | Admitting: Emergency Medicine

## 2017-02-22 DIAGNOSIS — Z87891 Personal history of nicotine dependence: Secondary | ICD-10-CM | POA: Insufficient documentation

## 2017-02-22 DIAGNOSIS — Y9301 Activity, walking, marching and hiking: Secondary | ICD-10-CM | POA: Insufficient documentation

## 2017-02-22 DIAGNOSIS — W19XXXA Unspecified fall, initial encounter: Secondary | ICD-10-CM

## 2017-02-22 DIAGNOSIS — Y929 Unspecified place or not applicable: Secondary | ICD-10-CM | POA: Insufficient documentation

## 2017-02-22 DIAGNOSIS — Y999 Unspecified external cause status: Secondary | ICD-10-CM | POA: Insufficient documentation

## 2017-02-22 DIAGNOSIS — Z79899 Other long term (current) drug therapy: Secondary | ICD-10-CM | POA: Insufficient documentation

## 2017-02-22 DIAGNOSIS — I10 Essential (primary) hypertension: Secondary | ICD-10-CM | POA: Insufficient documentation

## 2017-02-22 DIAGNOSIS — J449 Chronic obstructive pulmonary disease, unspecified: Secondary | ICD-10-CM | POA: Insufficient documentation

## 2017-02-22 DIAGNOSIS — S0990XA Unspecified injury of head, initial encounter: Secondary | ICD-10-CM | POA: Diagnosis present

## 2017-02-22 DIAGNOSIS — Z7901 Long term (current) use of anticoagulants: Secondary | ICD-10-CM | POA: Diagnosis not present

## 2017-02-22 DIAGNOSIS — S0083XA Contusion of other part of head, initial encounter: Secondary | ICD-10-CM | POA: Diagnosis not present

## 2017-02-22 DIAGNOSIS — W0110XA Fall on same level from slipping, tripping and stumbling with subsequent striking against unspecified object, initial encounter: Secondary | ICD-10-CM | POA: Diagnosis not present

## 2017-02-22 MED ORDER — IPRATROPIUM-ALBUTEROL 0.5-2.5 (3) MG/3ML IN SOLN
3.0000 mL | Freq: Once | RESPIRATORY_TRACT | Status: AC
  Administered 2017-02-22: 3 mL via RESPIRATORY_TRACT
  Filled 2017-02-22: qty 3

## 2017-02-22 MED ORDER — MAGNESIUM SULFATE 2 GM/50ML IV SOLN: 2.0000 g | Freq: Once | INTRAVENOUS | Status: DC

## 2017-02-22 MED ORDER — BACITRACIN ZINC 500 UNIT/GM EX OINT
TOPICAL_OINTMENT | CUTANEOUS | Status: AC
  Filled 2017-02-22: qty 1.8

## 2017-02-22 NOTE — ED Triage Notes (Signed)
Patient arrived via EMS with fall while getting up to use the bathroom.  She states she tripped on the way to the bathroom.  Pt has left side forehead hematoma and three small left arm skin tears.  Pt AOx4, answering MD at bedside.

## 2017-02-22 NOTE — ED Notes (Signed)
Neb given.  Patient tolerated well.  Patient's daughter at bedside, expressing concerns r/t  Not being notified sooner that mother was in ED.  Reassurance and apologies given, with minimal effect.  Collyn called to bedside to speak with daughter and hear her concerns.  Patient cleaned and skin care given.  Changed into blue scrubs for discharge home.

## 2017-02-22 NOTE — ED Provider Notes (Signed)
[facility] Emergency Department Provider Note   ____________________________________________   First MD Initiated Contact with Patient 02/22/17 0421     (approximate)  I have reviewed the triage vital signs and the nursing notes.   HISTORY  Chief Complaint Fall    HPI Summer Bradley is a 72 y.o. female who comes into the hospital today after a fall.  The patient got up to use the restroom and fell.  She has a history of COPD and is on blood thinners.  She reports that she is on 2 L of O2 at home by nasal cannula.  The patient also lives at home alone.  She reports that her balance is not really good because she has had strokes in the past.  She denies any chest pain dizziness shortness of breath.  She hit her head on the left and has a contusion.  She also has some skin tears on her left elbow.  The patient states that she came in just to get checked out.  She denies any pain at this time.  Past Medical History:  Diagnosis Date  . A-fib ([place])   . Anxiety   . COPD (chronic obstructive pulmonary disease) ([place])   . Heart disease   . High cholesterol   . Hypertension   . Stroke [facility])     Patient Active Problem List   Diagnosis Date Noted  . Pulmonary emphysema ([place]) 04/25/2015  . Emphysema of lung ([place]) 05/09/2014  . 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 Hyperlipidemia 05/09/2014  . GI bleed   . Benign neoplasm of colon 03/03/2014  . Diverticulosis of colon without hemorrhage 03/03/2014  . 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 Jejunal ulcer 03/03/2014  . Gastric polyps 03/03/2014  . Acute posthemorrhagic anemia 03/02/2014  . Melena 02/27/2014  . LGI bleed   . Acute on chronic respiratory failure ([place]) 02/16/2014  . Tracheostomy status ([place]) 02/16/2014  . Aspiration pneumonitis ([place])   . Spontaneous pneumothorax    . Thrush   . COPD exacerbation ([place])   . SOB (shortness of breath)   . HLD (hyperlipidemia)   . Essential hypertension   . Dysphagia, pharyngoesophageal phase   . Acute tracheobronchitis 02/04/2014  . Atelectasis of left lung 02/04/2014  . Wheezing   . Acute respiratory failure ([place])   . Paroxysmal atrial fibrillation (HCC)   . Cerebral infarction due to occlusion or stenosis of precerebral artery ([place])   . 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 Stroke ([place]) 01/29/2014  . Respiratory failure ([place]) 01/29/2014    Past Surgical History:  Procedure Laterality Date  . ABDOMINAL SURGERY    . COLONOSCOPY N/A 03/03/2014   Procedure: COLONOSCOPY;  Surgeon: Inda Castle, MD;  Location: [place];  Service: Endoscopy;  Laterality: N/A;  . ENTEROSCOPY N/A 03/03/2014   Procedure: ENTEROSCOPY;  Surgeon: Inda Castle, MD;  [facility]COPY;  Service: Endoscopy;  Laterality: N/A;  . ESOPHAGOGASTRODUODENOSCOPY N/A 03/01/2014   Procedure: ESOPHAGOGASTRODUODENOSCOPY (EGD);  Surgeon: Inda Castle, MD;  Location: [place];  Service: Endoscopy;  Laterality: N/A;  . RADIOLOGY WITH ANESTHESIA N/A 01/29/2014   Procedure: RADIOLOGY WITH ANESTHESIA;  Surgeon: Luanne Bras, MD;  Location: [place];  Service: Radiology;  Laterality: N/A;  . RADIOLOGY WITH ANESTHESIA N/A 02/01/2014   Procedure: RADIOLOGY WITH ANESTHESIA;  Surgeon: Rob Hickman, MD;  Location: [facility] NEURO ORS;  Service: Radiology;  Laterality: N/A;  . TRACHEOSTOMY  02/15/14   feinstein    Prior to Admission medications   Medication Sig Start Date End Date Taking? Authorizing Provider  acetaminophen (TYLENOL) 325 MG tablet Take 650 mg by mouth every 6 (six) hours as needed (TAKE 2 TABLETS EVERY 6 HOURS AS NEEDED).    [provider]  albuterol (PROVENTIL HFA;VENTOLIN HFA) 108 (90 BASE) MCG/ACT inhaler Inhale 1 puff into the lungs every 6 (six) hours as needed for wheezing or shortness of  breath.    [provider]  apixaban (ELIQUIS) 5 MG TABS tablet Take 1 tablet (5 mg total) by mouth 2 (two) times daily. 03/07/14   Rosalin Hawking, MD  bisoprolol (ZEBETA) 5 MG tablet Take 0.5 tablets (2.5 mg total) by mouth 2 (two) times daily. 03/05/14   Rosalin Hawking, MD  budesonide-formoterol [facility]) 160-4.5 MCG/ACT inhaler Inhale 2 puffs into the lungs 2 (two) times daily.    [provider]  Calcium Carbonate-Vitamin D 600-400 MG-UNIT per tablet Take 1 tablet by mouth daily.    [provider]  citalopram (CELEXA) 40 MG tablet  07/04/14   [provider]  diltiazem (DILACOR XR) 240 MG 24 hr capsule  09/25/14   [provider]  fluticasone (FLONASE) 50 MCG/ACT nasal spray Place 1 spray into both nostrils daily as needed for allergies.  12/06/13   [provider]  furosemide (LASIX) 20 MG tablet Take 1 tablet (20 mg total) by mouth 2 (two) times daily. 03/05/14   Rosalin Hawking, MD  ipratropium (ATROVENT HFA) 17 MCG/ACT inhaler Inhale 2 puffs into the lungs 4 (four) times daily.    [provider]  levothyroxine (SYNTHROID, LEVOTHROID) 150 MCG tablet Take 150 mcg by mouth daily before breakfast.  01/09/14   [provider]  Multiple Vitamin (MULTIVITAMIN WITH MINERALS) TABS tablet Take 1 tablet by mouth daily.    [provider]  potassium chloride SA (K-DUR,KLOR-CON) 20 MEQ tablet Take 2 tablets (40 mEq total) by mouth 2 (two) times daily. 03/05/14   Rosalin Hawking, MD  SPIRIVA RESPIMAT 1.25 MCG/ACT AERS  06/05/16   [provider]    Allergies Lisinopril  Family History  Problem Relation Age of Onset  . Diabetes Mother     Social History Social History   Tobacco Use  . Smoking status: Former Research scientist (life sciences)  . Smokeless tobacco: Never Used  . Tobacco comment: QUIT SMOKING "13 YEARS AGO"  Substance Use Topics  . Alcohol use: No    Alcohol/week: 0.0 oz  . Drug use: No    Review of Systems  Constitutional: No  fever/chills Eyes: No visual changes. ENT: No sore throat. Cardiovascular: Denies chest pain. Respiratory: Denies shortness of breath. Gastrointestinal: No abdominal pain.  No nausea, no vomiting.  No diarrhea.  No constipation. Genitourinary: Negative for dysuria. Musculoskeletal: Negative for back pain. Skin: Forehead contusion Neurological: Negative for headaches, focal weakness or numbness.   ____________________________________________   PHYSICAL EXAM:  VITAL SIGNS: ED Triage Vitals  Enc Vitals Group     BP 02/22/17 0433 (!) 143/90     Pulse Rate 02/22/17 0433 65     Resp 02/22/17 0433 19     Temp 02/22/17 0433 97.7 F (36.5 C)     Temp Source 02/22/17 0433 Oral     SpO2 02/22/17 0433 97 %     Weight 02/22/17 0435 196 lb 10.4 oz (89.2 kg)     Height 02/22/17  7741 5\' 2"  (1.575 m)     Head Circumference --      Peak Flow --      Pain Score --      Pain Loc --      Pain Edu? --      Excl. in [place]? --     Constitutional: Alert and oriented. Well appearing and in mild distress. Eyes: Conjunctivae are normal. PERRL. EOMI. Head: Contusion to left forehead Neck: no cervical spine tenderness to palpation Nose: No congestion/rhinnorhea. Mouth/Throat: Mucous membranes are moist.  Oropharynx non-erythematous. Cardiovascular: Normal rate, regular rhythm. Grossly normal heart sounds.  Good peripheral circulation. Respiratory: Normal respiratory effort.  No retractions. Lungs CTAB. Gastrointestinal: Soft and nontender. No distention.  Active bowel sounds Musculoskeletal: No lower extremity tenderness nor edema.   Neurologic:  Normal speech and language.  Skin: Skin tears to left elbow no active bleeding Psychiatric: Mood and affect are normal.   ____________________________________________   LABS (all labs ordered are listed, but only abnormal results are displayed)  Labs Reviewed - No data to display ____________________________________________  EKG  ED ECG  REPORT I, Loney Hering, the attending physician, personally viewed and interpreted this ECG.   Date: 02/22/2017  EKG Time: 440  Rate: 67  Rhythm: normal sinus rhythm  Axis: normal  Intervals:prolonged qtc  ST&T Change: none  ____________________________________________  RADIOLOGY  Ct Head Wo Contrast  Result Date: 02/22/2017 CLINICAL DATA:  72 y/o F; fall in bathroom with bruise to left forehead. EXAM: CT HEAD WITHOUT CONTRAST CT CERVICAL SPINE WITHOUT CONTRAST TECHNIQUE: Multidetector CT imaging of the head and cervical spine was performed following the standard protocol without intravenous contrast. Multiplanar CT image reconstructions of the cervical spine were also generated. COMPARISON:  06/19/2016 MRI head. 06/18/2016 CT head. 03/22/2016 CT head and cervical spine. FINDINGS: CT HEAD FINDINGS Brain: Stable chronic infarct of right MCA distribution. Wallerian degeneration of right cortical spinal tracts extending into pons. Stable small chronic infarctions of right cerebellar hemisphere and left thalamus. Stable chronic microvascular ischemic changes of white matter and brain parenchymal volume loss. Ex vacuo dilatation of right lateral ventricle. No large acute stroke, hemorrhage, or mass effect identified. Vascular: Calcific atherosclerosis of carotid siphons and vertebral arteries. No hyperdense vessel identified. Skull: Small left frontal scalp contusion.  No calvarial fracture. Sinuses/Orbits: Opacification of right mastoid tip. Otherwise negative. Other: None. CT CERVICAL SPINE FINDINGS Alignment: Straightening of cervical lordosis, no listhesis. Skull base and vertebrae: No acute fracture. No primary bone lesion or focal pathologic process. Soft tissues and spinal canal: No prevertebral fluid or swelling. No visible canal hematoma. Disc levels: Advanced cervical spondylosis with advanced facet arthropathy greater on the left. Multiple levels of mild-to-moderate canal stenosis.  Uncovertebral and facet hypertrophy encroaches on neural foramen bilaterally from C3 through C7 levels. Upper chest: Negative. Other: Calcific atherosclerosis of carotid bifurcations. Retropharyngeal course of proximal ICA bilaterally. IMPRESSION: 1. Small left frontal scalp contusion.  No calvarial fracture. 2. No acute intracranial abnormality. 3. No acute fracture or dislocation of cervical spine. 4. Large chronic right MCA distribution infarction as well as small infarctions of right cerebellar hemisphere and left thalamus. 5. Advanced cervical spondylosis with prominent left-sided facet arthropathy. Electronically Signed   By: Kristine Garbe M.D.   On: 02/22/2017 05:48   Ct Cervical Spine Wo Contrast  Result Date: 02/22/2017 CLINICAL DATA:  72 y/o F; fall in bathroom with bruise to left forehead. EXAM: CT HEAD WITHOUT CONTRAST CT CERVICAL SPINE WITHOUT CONTRAST TECHNIQUE:  Multidetector CT imaging of the head and cervical spine was performed following the standard protocol without intravenous contrast. Multiplanar CT image reconstructions of the cervical spine were also generated. COMPARISON:  06/19/2016 MRI head. 06/18/2016 CT head. 03/22/2016 CT head and cervical spine. FINDINGS: CT HEAD FINDINGS Brain: Stable chronic infarct of right MCA distribution. Wallerian degeneration of right cortical spinal tracts extending into pons. Stable small chronic infarctions of right cerebellar hemisphere and left thalamus. Stable chronic microvascular ischemic changes of white matter and brain parenchymal volume loss. Ex vacuo dilatation of right lateral ventricle. No large acute stroke, hemorrhage, or mass effect identified. Vascular: Calcific atherosclerosis of carotid siphons and vertebral arteries. No hyperdense vessel identified. Skull: Small left frontal scalp contusion.  No calvarial fracture. Sinuses/Orbits: Opacification of right mastoid tip. Otherwise negative. Other: None. CT CERVICAL SPINE FINDINGS  Alignment: Straightening of cervical lordosis, no listhesis. Skull base and vertebrae: No acute fracture. No primary bone lesion or focal pathologic process. Soft tissues and spinal canal: No prevertebral fluid or swelling. No visible canal hematoma. Disc levels: Advanced cervical spondylosis with advanced facet arthropathy greater on the left. Multiple levels of mild-to-moderate canal stenosis. Uncovertebral and facet hypertrophy encroaches on neural foramen bilaterally from C3 through C7 levels. Upper chest: Negative. Other: Calcific atherosclerosis of carotid bifurcations. Retropharyngeal course of proximal ICA bilaterally. IMPRESSION: 1. Small left frontal scalp contusion.  No calvarial fracture. 2. No acute intracranial abnormality. 3. No acute fracture or dislocation of cervical spine. 4. Large chronic right MCA distribution infarction as well as small infarctions of right cerebellar hemisphere and left thalamus. 5. Advanced cervical spondylosis with prominent left-sided facet arthropathy. Electronically Signed   By: Kristine Garbe M.D.   On: 02/22/2017 05:48    ____________________________________________   PROCEDURES  Procedure(s) performed: None  Procedures  Critical Care performed: No  ____________________________________________   INITIAL IMPRESSION / ASSESSMENT AND PLAN / ED COURSE  As part of my medical decision making, I reviewed the following data within the electronic MEDICAL RECORD NUMBER Notes from prior ED visits and [place] Controlled Substance Database   This is a 72 year old female who comes into the hospital today after a fall at home.  The patient hit her head but does not have any pain at this time.  We did send the patient for a CT scan of her head and cervical spine which were both unremarkable.  The patient has a large chronic right MCA distribution infarction.  I did get her ready to go but she did have some significant coughing.  I will give her a dose of  magnesium sulfate as well as a DuoNeb treatment.  She was still be discharged home.  We will ensure that she is able to ambulate without any worsened pain.     ____________________________________________   FINAL CLINICAL IMPRESSION(S) / ED DIAGNOSES  Final diagnoses:  Fall, initial encounter  Injury of head, initial encounter  Contusion of other part of head, initial encounter     ED Discharge Orders    None       Note:  This document was prepared using Dragon voice recognition software and may include unintentional dictation errors.    Loney Hering, MD 02/22/17 [phone]

## 2017-02-22 NOTE — Discharge Instructions (Signed)
PLease follow up with your primary care physician. For further evaluation.

## 2017-02-22 NOTE — ED Notes (Signed)
AAOx3.  Skin warm and dry.  No SOB/ DOE.  Ambulated patient in room with 2 L/ [place], patient tolerated well.  D/C home

## 2017-03-09 ENCOUNTER — Encounter: Payer: Self-pay | Admitting: Neurology

## 2017-03-16 ENCOUNTER — Ambulatory Visit: Payer: Self-pay | Admitting: Internal Medicine

## 2017-05-25 ENCOUNTER — Encounter: Payer: Self-pay | Admitting: *Deleted

## 2017-05-27 ENCOUNTER — Encounter
Admission: RE | Disposition: A | Discharge status: Home / Self Care | Payer: Self-pay | Source: Ambulatory Visit | Attending: Ophthalmology

## 2017-05-27 ENCOUNTER — Ambulatory Visit
Admission: RE | Admit: 2017-05-27 | Discharge: 2017-05-27 | Disposition: A | Discharge status: Home / Self Care | Payer: No Typology Code available for payment source | Source: Ambulatory Visit | Attending: Ophthalmology | Admitting: Ophthalmology

## 2017-05-27 ENCOUNTER — Encounter: Payer: Self-pay | Admitting: *Deleted

## 2017-05-27 ENCOUNTER — Ambulatory Visit: Payer: No Typology Code available for payment source | Admitting: Registered Nurse

## 2017-05-27 ENCOUNTER — Other Ambulatory Visit: Payer: Self-pay

## 2017-05-27 DIAGNOSIS — Z7951 Long term (current) use of inhaled steroids: Secondary | ICD-10-CM | POA: Diagnosis not present

## 2017-05-27 DIAGNOSIS — H2511 Age-related nuclear cataract, right eye: Secondary | ICD-10-CM | POA: Insufficient documentation

## 2017-05-27 DIAGNOSIS — E039 Hypothyroidism, unspecified: Secondary | ICD-10-CM | POA: Diagnosis not present

## 2017-05-27 DIAGNOSIS — Z7901 Long term (current) use of anticoagulants: Secondary | ICD-10-CM | POA: Insufficient documentation

## 2017-05-27 DIAGNOSIS — Z9981 Dependence on supplemental oxygen: Secondary | ICD-10-CM | POA: Diagnosis not present

## 2017-05-27 DIAGNOSIS — Z79890 Hormone replacement therapy (postmenopausal): Secondary | ICD-10-CM | POA: Diagnosis not present

## 2017-05-27 DIAGNOSIS — I10 Essential (primary) hypertension: Secondary | ICD-10-CM | POA: Diagnosis not present

## 2017-05-27 DIAGNOSIS — I4891 Unspecified atrial fibrillation: Secondary | ICD-10-CM | POA: Insufficient documentation

## 2017-05-27 DIAGNOSIS — I739 Peripheral vascular disease, unspecified: Secondary | ICD-10-CM | POA: Diagnosis not present

## 2017-05-27 DIAGNOSIS — K219 Gastro-esophageal reflux disease without esophagitis: Secondary | ICD-10-CM | POA: Insufficient documentation

## 2017-05-27 DIAGNOSIS — Z87891 Personal history of nicotine dependence: Secondary | ICD-10-CM | POA: Insufficient documentation

## 2017-05-27 DIAGNOSIS — F418 Other specified anxiety disorders: Secondary | ICD-10-CM | POA: Diagnosis not present

## 2017-05-27 DIAGNOSIS — Z79899 Other long term (current) drug therapy: Secondary | ICD-10-CM | POA: Insufficient documentation

## 2017-05-27 HISTORY — DX: Dizziness and giddiness: R42

## 2017-05-27 HISTORY — PX: CATARACT EXTRACTION W/PHACO: SHX586

## 2017-05-27 HISTORY — DX: Hypothyroidism, unspecified: E03.9

## 2017-05-27 HISTORY — DX: Dependence on supplemental oxygen: Z99.81

## 2017-05-27 HISTORY — DX: Gastro-esophageal reflux disease without esophagitis: K21.9

## 2017-05-27 HISTORY — DX: Major depressive disorder, single episode, unspecified: F32.9

## 2017-05-27 HISTORY — DX: Cardiac arrhythmia, unspecified: I49.9

## 2017-05-27 HISTORY — DX: Depression, unspecified: F32.A

## 2017-05-27 SURGERY — PHACOEMULSIFICATION, CATARACT, WITH IOL INSERTION
Anesthesia: Monitor Anesthesia Care | Site: Eye | Laterality: Right | Wound class: Clean

## 2017-05-27 MED ORDER — MOXIFLOXACIN HCL 0.5 % OP SOLN
OPHTHALMIC | Status: DC | PRN
  Administered 2017-05-27: 0.2 mL via OPHTHALMIC

## 2017-05-27 MED ORDER — MIDAZOLAM HCL 2 MG/2ML IJ SOLN
INTRAMUSCULAR | Status: DC | PRN
  Administered 2017-05-27: 1 mg via INTRAVENOUS
  Administered 2017-05-27: 0.5 mg via INTRAVENOUS

## 2017-05-27 MED ORDER — PHENYLEPHRINE HCL 10 MG/ML IJ SOLN
INTRAMUSCULAR | Status: AC
  Filled 2017-05-27: qty 1

## 2017-05-27 MED ORDER — LIDOCAINE HCL (PF) 4 % IJ SOLN
INTRAOCULAR | Status: DC | PRN
  Administered 2017-05-27: 4 mL via OPHTHALMIC

## 2017-05-27 MED ORDER — LIDOCAINE HCL (PF) 4 % IJ SOLN
INTRAMUSCULAR | Status: AC
  Filled 2017-05-27: qty 5

## 2017-05-27 MED ORDER — EPINEPHRINE PF 1 MG/ML IJ SOLN
INTRAMUSCULAR | Status: AC
  Filled 2017-05-27: qty 2

## 2017-05-27 MED ORDER — MOXIFLOXACIN HCL 0.5 % OP SOLN: 1.0000 [drp] | OPHTHALMIC | Status: DC | PRN

## 2017-05-27 MED ORDER — MOXIFLOXACIN HCL 0.5 % OP SOLN
OPHTHALMIC | Status: AC
  Filled 2017-05-27: qty 3

## 2017-05-27 MED ORDER — MIDAZOLAM HCL 2 MG/2ML IJ SOLN
INTRAMUSCULAR | Status: AC
  Filled 2017-05-27: qty 2

## 2017-05-27 MED ORDER — EPHEDRINE SULFATE 50 MG/ML IJ SOLN
INTRAMUSCULAR | Status: AC
  Filled 2017-05-27: qty 1

## 2017-05-27 MED ORDER — FENTANYL CITRATE (PF) 100 MCG/2ML IJ SOLN
INTRAMUSCULAR | Status: AC
  Filled 2017-05-27: qty 2

## 2017-05-27 MED ORDER — ARMC OPHTHALMIC DILATING DROPS
1.0000 "application " | OPHTHALMIC | Status: AC
  Administered 2017-05-27 (×3): 1 via OPHTHALMIC

## 2017-05-27 MED ORDER — POVIDONE-IODINE 5 % OP SOLN
OPHTHALMIC | Status: DC | PRN
Start: 2017-05-27 — End: 2017-05-27
  Administered 2017-05-27: 1 via OPHTHALMIC

## 2017-05-27 MED ORDER — EPINEPHRINE PF 1 MG/ML IJ SOLN
INTRAOCULAR | Status: DC | PRN
  Administered 2017-05-27: 09:00:00 via OPHTHALMIC

## 2017-05-27 MED ORDER — FENTANYL CITRATE (PF) 100 MCG/2ML IJ SOLN
INTRAMUSCULAR | Status: DC | PRN
  Administered 2017-05-27 (×2): 25 ug via INTRAVENOUS

## 2017-05-27 MED ORDER — SODIUM HYALURONATE 10 MG/ML IO SOLN
INTRAOCULAR | Status: DC | PRN
  Administered 2017-05-27: 0.55 mL via INTRAOCULAR

## 2017-05-27 MED ORDER — ARMC OPHTHALMIC DILATING DROPS
OPHTHALMIC | Status: AC
  Filled 2017-05-27: qty 0.4

## 2017-05-27 MED ORDER — SODIUM CHLORIDE 0.9 % IV SOLN
INTRAVENOUS | Status: DC
  Administered 2017-05-27: 08:00:00 via INTRAVENOUS

## 2017-05-27 MED ORDER — SODIUM HYALURONATE 23 MG/ML IO SOLN
INTRAOCULAR | Status: AC
  Filled 2017-05-27: qty 0.6

## 2017-05-27 MED ORDER — POVIDONE-IODINE 5 % OP SOLN
OPHTHALMIC | Status: AC
  Filled 2017-05-27: qty 30

## 2017-05-27 MED ORDER — SODIUM HYALURONATE 23 MG/ML IO SOLN
INTRAOCULAR | Status: DC | PRN
  Administered 2017-05-27: 0.6 mL via INTRAOCULAR

## 2017-05-27 SURGICAL SUPPLY — 16 items
DISSECTOR HYDRO NUCLEUS 50X22 (MISCELLANEOUS) ×3 IMPLANT
GLOVE BIO SURGEON STRL SZ8 (GLOVE) ×3 IMPLANT
GLOVE BIOGEL M 6.5 STRL (GLOVE) ×3 IMPLANT
GLOVE SURG LX 7.5 STRW (GLOVE) ×2
GLOVE SURG LX STRL 7.5 STRW (GLOVE) ×1 IMPLANT
GOWN STRL REUS W/ TWL LRG LVL3 (GOWN DISPOSABLE) ×2 IMPLANT
GOWN STRL REUS W/TWL LRG LVL3 (GOWN DISPOSABLE) ×4
LABEL CATARACT MEDS ST (LABEL) ×3 IMPLANT
LENS IOL TECNIS ITEC 22.5 (Intraocular Lens) ×3 IMPLANT
PACK CATARACT (MISCELLANEOUS) ×3 IMPLANT
PACK CATARACT KING (MISCELLANEOUS) ×3 IMPLANT
PACK EYE AFTER SURG (MISCELLANEOUS) ×3 IMPLANT
SOL BSS BAG (MISCELLANEOUS) ×3
SOLUTION BSS BAG (MISCELLANEOUS) ×1 IMPLANT
WATER STERILE IRR 250ML POUR (IV SOLUTION) ×3 IMPLANT
WIPE NON LINTING 3.25X3.25 (MISCELLANEOUS) ×3 IMPLANT

## 2017-05-27 NOTE — Transfer of Care (Signed)
Immediate Anesthesia Transfer of Care Note  Patient: Summer Bradley  Procedure(s) Performed: CATARACT EXTRACTION PHACO AND INTRAOCULAR LENS PLACEMENT (IOC) (Right Eye)  Patient Location: PACU  Anesthesia Type:MAC  Level of Consciousness: awake, alert  and oriented  Airway & Oxygen Therapy: Patient Spontanous Breathing and Patient connected to nasal cannula oxygen  Post-op Assessment: Report given to RN and Post -op Vital signs reviewed and stable  Post vital signs: Reviewed and stable  Last Vitals:  Vitals Value Taken Time  BP    Temp    Pulse    Resp    SpO2      Last Pain:  Vitals:   05/27/17 0711  TempSrc: Temporal  PainSc: 0-No pain         Complications: No apparent anesthesia complications

## 2017-05-27 NOTE — H&P (Signed)
The History and Physical notes are on paper, have been signed, and are to be scanned.   I have examined the patient and there are no changes to the H&P.   Benay Pillow 05/27/2017 8:22 AM

## 2017-05-27 NOTE — Anesthesia Post-op Follow-up Note (Signed)
Anesthesia QCDR form completed.        

## 2017-05-27 NOTE — Discharge Instructions (Signed)
Eye Surgery Discharge Instructions  Expect mild scratchy sensation or mild soreness. DO NOT RUB YOUR EYE!  The day of surgery:  Minimal physical activity, but bed rest is not required  No reading, computer work, or close hand work  No bending, lifting, or straining.  May watch TV  For 24 hours:  No driving, legal decisions, or alcoholic beverages  Safety precautions  Eat anything you prefer: It is better to start with liquids, then soup then solid foods.  _____ Eye patch should be worn until postoperative exam tomorrow.  ____ Solar shield eyeglasses should be worn for comfort in the sunlight/patch while sleeping  Resume all regular medications including aspirin or Coumadin if these were discontinued prior to surgery. You may shower, bathe, shave, or wash your hair. Tylenol may be taken for mild discomfort.  Call your doctor if you experience significant pain, nausea, or vomiting, fever > 101 or other signs of infection. [phone] or [phone] Specific instructions:  Follow-up Information    Eulogio Bear, MD Follow up.   Specialty:  Ophthalmology Why:  April 12 at 1:20pm Contact information: [place] [address] [phone]
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
## 2017-05-27 NOTE — Anesthesia Postprocedure Evaluation (Signed)
Anesthesia Post Note  Patient: Summer Bradley  Procedure(s) Performed: CATARACT EXTRACTION PHACO AND INTRAOCULAR LENS PLACEMENT (IOC) (Right Eye)  Patient location during evaluation: Short Stay Anesthesia Type: MAC Level of consciousness: awake and alert and oriented Pain management: pain level controlled Vital Signs Assessment: post-procedure vital signs reviewed and stable Respiratory status: spontaneous breathing Cardiovascular status: blood pressure returned to baseline Postop Assessment: no apparent nausea or vomiting and adequate PO intake Anesthetic complications: no     Last Vitals:  Vitals:   05/27/17 0711  BP: (!) 142/89  Pulse: 62  Resp: 16  Temp: 37.3 C  SpO2: 97%    Last Pain:  Vitals:   05/27/17 0711  TempSrc: Temporal  PainSc: 0-No pain                 Trinna Post

## 2017-05-27 NOTE — Op Note (Signed)
OPERATIVE NOTE  Summer Bradley [id number] 05/27/2017   PREOPERATIVE DIAGNOSIS:  Nuclear sclerotic cataract right eye.  H25.11   POSTOPERATIVE DIAGNOSIS:    Nuclear sclerotic cataract right eye.     PROCEDURE:  Phacoemusification with posterior chamber intraocular lens placement of the right eye   LENS:   Implant Name Type Inv. Item Serial No. Manufacturer Lot No. LRB No. Used  LENS IOL DIOP 22.5 - [id number] Intraocular Lens LENS IOL DIOP 22.5 [id number] AMO  Right 1       PCB00 +22.5   ULTRASOUND TIME: 0 minutes 29 seconds.  CDE 3.12   SURGEON:  Benay Pillow, MD, MPH  ANESTHESIOLOGIST: Anesthesiologist: Piscitello, Precious Haws, MD CRNA: Aline Brochure, CRNA Student Nurse Anesthetist: Trinna Post, RN   ANESTHESIA:  Topical with tetracaine drops augmented with 1% preservative-free intracameral lidocaine.  ESTIMATED BLOOD LOSS: less than 1 mL.   COMPLICATIONS:  None.   DESCRIPTION OF PROCEDURE:  The patient was identified in the holding room and transported to the operating room and placed in the supine position under the operating microscope.  The right eye was identified as the operative eye and it was prepped and draped in the usual sterile ophthalmic fashion.   A 1.0 millimeter clear-corneal paracentesis was made at the 10:30 position. 0.5 ml of preservative-free 1% lidocaine with epinephrine was injected into the anterior chamber.  The anterior chamber was filled with Healon 5 viscoelastic.  A 2.4 millimeter keratome was used to make a near-clear corneal incision at the 8:00 position.  A curvilinear capsulorrhexis was made with a cystotome and capsulorrhexis forceps.  Balanced salt solution was used to hydrodissect and hydrodelineate the nucleus.   Phacoemulsification was then used in stop and chop fashion to remove the lens nucleus and epinucleus.  The remaining cortex was then removed using the irrigation and aspiration handpiece. Healon was then placed into the  capsular bag to distend it for lens placement.  A lens was then injected into the capsular bag.  The remaining viscoelastic was aspirated.   Wounds were hydrated with balanced salt solution.  The anterior chamber was inflated to a physiologic pressure with balanced salt solution.   Intracameral vigamox 0.1 mL undiluted was injected into the eye and a drop placed onto the ocular surface.  No wound leaks were noted.  The patient was taken to the recovery room in stable condition without complications of anesthesia or surgery  Benay Pillow 05/27/2017, 9:10 AM

## 2017-07-26 ENCOUNTER — Ambulatory Visit: Payer: Medicare PPO | Admitting: Neurology

## 2017-07-29 ENCOUNTER — Encounter: Payer: Self-pay | Admitting: *Deleted

## 2017-08-12 ENCOUNTER — Encounter
Admission: RE | Disposition: A | Discharge status: Home / Self Care | Payer: Self-pay | Source: Ambulatory Visit | Attending: Ophthalmology

## 2017-08-12 ENCOUNTER — Ambulatory Visit: Payer: Medicare (Managed Care) | Admitting: Certified Registered"

## 2017-08-12 ENCOUNTER — Encounter: Payer: Self-pay | Admitting: Emergency Medicine

## 2017-08-12 ENCOUNTER — Ambulatory Visit
Admission: RE | Admit: 2017-08-12 | Discharge: 2017-08-12 | Disposition: A | Discharge status: Home / Self Care | Payer: Medicare (Managed Care) | Source: Ambulatory Visit | Attending: Ophthalmology | Admitting: Ophthalmology

## 2017-08-12 DIAGNOSIS — I739 Peripheral vascular disease, unspecified: Secondary | ICD-10-CM | POA: Diagnosis not present

## 2017-08-12 DIAGNOSIS — Z8673 Personal history of transient ischemic attack (TIA), and cerebral infarction without residual deficits: Secondary | ICD-10-CM | POA: Diagnosis not present

## 2017-08-12 DIAGNOSIS — Z7901 Long term (current) use of anticoagulants: Secondary | ICD-10-CM | POA: Insufficient documentation

## 2017-08-12 DIAGNOSIS — F329 Major depressive disorder, single episode, unspecified: Secondary | ICD-10-CM | POA: Diagnosis not present

## 2017-08-12 DIAGNOSIS — K219 Gastro-esophageal reflux disease without esophagitis: Secondary | ICD-10-CM | POA: Diagnosis not present

## 2017-08-12 DIAGNOSIS — Z87891 Personal history of nicotine dependence: Secondary | ICD-10-CM | POA: Insufficient documentation

## 2017-08-12 DIAGNOSIS — Z888 Allergy status to other drugs, medicaments and biological substances status: Secondary | ICD-10-CM | POA: Diagnosis not present

## 2017-08-12 DIAGNOSIS — I10 Essential (primary) hypertension: Secondary | ICD-10-CM | POA: Insufficient documentation

## 2017-08-12 DIAGNOSIS — H2512 Age-related nuclear cataract, left eye: Secondary | ICD-10-CM | POA: Diagnosis present

## 2017-08-12 DIAGNOSIS — I4891 Unspecified atrial fibrillation: Secondary | ICD-10-CM | POA: Diagnosis not present

## 2017-08-12 DIAGNOSIS — E039 Hypothyroidism, unspecified: Secondary | ICD-10-CM | POA: Insufficient documentation

## 2017-08-12 DIAGNOSIS — Z9981 Dependence on supplemental oxygen: Secondary | ICD-10-CM | POA: Insufficient documentation

## 2017-08-12 DIAGNOSIS — Z79899 Other long term (current) drug therapy: Secondary | ICD-10-CM | POA: Insufficient documentation

## 2017-08-12 DIAGNOSIS — E7800 Pure hypercholesterolemia, unspecified: Secondary | ICD-10-CM | POA: Insufficient documentation

## 2017-08-12 DIAGNOSIS — F419 Anxiety disorder, unspecified: Secondary | ICD-10-CM | POA: Diagnosis not present

## 2017-08-12 DIAGNOSIS — Z8711 Personal history of peptic ulcer disease: Secondary | ICD-10-CM | POA: Insufficient documentation

## 2017-08-12 DIAGNOSIS — J449 Chronic obstructive pulmonary disease, unspecified: Secondary | ICD-10-CM | POA: Diagnosis not present

## 2017-08-12 HISTORY — DX: Cough: R05

## 2017-08-12 HISTORY — DX: Wheezing: R06.2

## 2017-08-12 HISTORY — DX: Orthopnea: R06.01

## 2017-08-12 HISTORY — DX: Cough, unspecified: R05.9

## 2017-08-12 HISTORY — PX: CATARACT EXTRACTION W/PHACO: SHX586

## 2017-08-12 SURGERY — PHACOEMULSIFICATION, CATARACT, WITH IOL INSERTION
Anesthesia: Monitor Anesthesia Care | Site: Eye | Laterality: Left | Wound class: "Clean "

## 2017-08-12 MED ORDER — FENTANYL CITRATE (PF) 100 MCG/2ML IJ SOLN: 25.0000 ug | INTRAMUSCULAR | Status: DC | PRN

## 2017-08-12 MED ORDER — MOXIFLOXACIN HCL 0.5 % OP SOLN
OPHTHALMIC | Status: AC
  Filled 2017-08-12: qty 3

## 2017-08-12 MED ORDER — MIDAZOLAM HCL 2 MG/2ML IJ SOLN
INTRAMUSCULAR | Status: DC | PRN
  Administered 2017-08-12: 1 mg via INTRAVENOUS

## 2017-08-12 MED ORDER — SODIUM HYALURONATE 23 MG/ML IO SOLN
INTRAOCULAR | Status: AC
  Filled 2017-08-12: qty 0.6

## 2017-08-12 MED ORDER — POVIDONE-IODINE 5 % OP SOLN
OPHTHALMIC | Status: DC | PRN
  Administered 2017-08-12: 1 via OPHTHALMIC

## 2017-08-12 MED ORDER — MIDAZOLAM HCL 2 MG/2ML IJ SOLN
INTRAMUSCULAR | Status: AC
  Filled 2017-08-12: qty 2

## 2017-08-12 MED ORDER — MOXIFLOXACIN HCL 0.5 % OP SOLN
OPHTHALMIC | Status: DC | PRN
  Administered 2017-08-12: 0.2 mL via OPHTHALMIC

## 2017-08-12 MED ORDER — SODIUM HYALURONATE 10 MG/ML IO SOLN
INTRAOCULAR | Status: DC | PRN
  Administered 2017-08-12: 0.55 mL via INTRAOCULAR

## 2017-08-12 MED ORDER — SODIUM HYALURONATE 23 MG/ML IO SOLN
INTRAOCULAR | Status: DC | PRN
  Administered 2017-08-12: 0.6 mL via INTRAOCULAR

## 2017-08-12 MED ORDER — ARMC OPHTHALMIC DILATING DROPS
OPHTHALMIC | Status: AC
  Administered 2017-08-12: 1 via OPHTHALMIC
  Filled 2017-08-12: qty 0.4

## 2017-08-12 MED ORDER — EPINEPHRINE PF 1 MG/ML IJ SOLN
INTRAMUSCULAR | Status: AC
Start: 2017-08-12 — End: ?
  Filled 2017-08-12: qty 2

## 2017-08-12 MED ORDER — SODIUM CHLORIDE 0.9 % IV SOLN
INTRAVENOUS | Status: DC
  Administered 2017-08-12: 09:00:00 via INTRAVENOUS

## 2017-08-12 MED ORDER — ONDANSETRON HCL 4 MG/2ML IJ SOLN: 4.0000 mg | Freq: Once | INTRAMUSCULAR | Status: DC | PRN

## 2017-08-12 MED ORDER — ARMC OPHTHALMIC DILATING DROPS
1.0000 "application " | OPHTHALMIC | Status: AC
  Administered 2017-08-12 (×3): 1 via OPHTHALMIC

## 2017-08-12 MED ORDER — POVIDONE-IODINE 5 % OP SOLN
OPHTHALMIC | Status: AC
  Filled 2017-08-12: qty 30

## 2017-08-12 MED ORDER — EPINEPHRINE PF 1 MG/ML IJ SOLN
INTRAOCULAR | Status: DC | PRN
  Administered 2017-08-12: 10:00:00 via OPHTHALMIC

## 2017-08-12 MED ORDER — LIDOCAINE HCL (PF) 4 % IJ SOLN
INTRAMUSCULAR | Status: AC
  Filled 2017-08-12: qty 5

## 2017-08-12 MED ORDER — MOXIFLOXACIN HCL 0.5 % OP SOLN: 1.0000 [drp] | OPHTHALMIC | Status: DC | PRN

## 2017-08-12 SURGICAL SUPPLY — 16 items

## 2017-08-12 NOTE — Transfer of Care (Signed)
Immediate Anesthesia Transfer of Care Note  Patient: Summer Bradley  Procedure(s) Performed: CATARACT EXTRACTION PHACO AND INTRAOCULAR LENS PLACEMENT (I[facility]) (Left Eye)  Patient Location: PACU  Anesthesia Type:MAC  Level of Consciousness: awake, alert  and oriented  Airway & Oxygen Therapy: Patient Spontanous Breathing  Post-op Assessment: Report given to RN and Post -op Vital signs reviewed and stable  Post vital signs: Reviewed and stable  Last Vitals:  Vitals Value Taken Time  BP    Temp    Pulse    Resp    SpO2      Last Pain:  Vitals:   08/12/17 0835  TempSrc: Tympanic  PainSc: 0-No pain         Complications: No apparent anesthesia complications

## 2017-08-12 NOTE — OR Nursing (Signed)
Discharge instructions discussed with pt and daughter. Both voice understanding. 
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
## 2017-08-12 NOTE — Anesthesia Postprocedure Evaluation (Signed)
Anesthesia Post Note  Patient: Summer Bradley  Procedure(s) Performed: CATARACT EXTRACTION PHACO AND INTRAOCULAR LENS PLACEMENT ([place]) (Left Eye)  Patient location during evaluation: PACU Anesthesia Type: MAC Level of consciousness: awake and alert and oriented Pain management: pain level controlled Vital Signs Assessment: post-procedure vital signs reviewed and stable Respiratory status: spontaneous breathing Cardiovascular status: blood pressure returned to baseline Anesthetic complications: no     Last Vitals:  Vitals:   08/12/17 1014 08/12/17 1021  BP: (!) 138/92 (!) 142/93  Pulse: 77 75  Resp: 16 16  Temp: 36.5 C   SpO2: 96%     Last Pain:  Vitals:   08/12/17 1014  TempSrc:   PainSc: 0-No pain                 Lem Peary

## 2017-08-12 NOTE — H&P (Signed)
The History and Physical notes are on paper, have been signed, and are to be scanned.   I have examined the patient and there are no changes to the H&P.   Summer Bradley 08/12/2017 9:38 AM

## 2017-08-12 NOTE — Anesthesia Post-op Follow-up Note (Signed)
Anesthesia QCDR form completed.        

## 2017-08-12 NOTE — Op Note (Signed)
OPERATIVE NOTE  Summer Bradley [id number] 08/12/2017   PREOPERATIVE DIAGNOSIS:  Nuclear sclerotic cataract left eye.  H25.12   POSTOPERATIVE DIAGNOSIS:    Nuclear sclerotic cataract left eye.     PROCEDURE:  Phacoemusification with posterior chamber intraocular lens placement of the left eye   LENS:   Implant Name Type Inv. Item Serial No. Manufacturer Lot No. LRB No. Used  LENS IOL DIOP 22.0 - [id number] Intraocular Lens LENS IOL DIOP 22.0 [id number] AMO  Left 1       PCB00 +22.0   ULTRASOUND TIME: 0 minutes 23 seconds.  CDE 2.08   SURGEON:  Benay Pillow, MD, MPH   ANESTHESIA:  Topical with tetracaine drops augmented with 1% preservative-free intracameral lidocaine.  ESTIMATED BLOOD LOSS: <1 mL   COMPLICATIONS:  None.   DESCRIPTION OF PROCEDURE:  The patient was identified in the holding room and transported to the operating room and placed in the supine position under the operating microscope.  The left eye was identified as the operative eye and it was prepped and draped in the usual sterile ophthalmic fashion.   A 1.0 millimeter clear-corneal paracentesis was made at the 5:00 position. 0.5 ml of preservative-free 1% lidocaine with epinephrine was injected into the anterior chamber.  The anterior chamber was filled with Healon 5 viscoelastic.  A 2.4 millimeter keratome was used to make a near-clear corneal incision at the 2:00 position.  A curvilinear capsulorrhexis was made with a cystotome and capsulorrhexis forceps.  Balanced salt solution was used to hydrodissect and hydrodelineate the nucleus.   Phacoemulsification was then used in stop and chop fashion to remove the lens nucleus and epinucleus.  The remaining cortex was then removed using the irrigation and aspiration handpiece. Healon was then placed into the capsular bag to distend it for lens placement.  A lens was then injected into the capsular bag.  The remaining viscoelastic was aspirated.   Wounds were  hydrated with balanced salt solution.  The anterior chamber was inflated to a physiologic pressure with balanced salt solution.  Intracameral vigamox 0.1 mL undiltued was injected into the eye and a drop placed onto the ocular surface.  No wound leaks were noted.  The patient was taken to the recovery room in stable condition without complications of anesthesia or surgery  Benay Pillow 08/12/2017, 10:14 AM

## 2017-08-12 NOTE — Discharge Instructions (Signed)
Eye Surgery Discharge Instructions    Expect mild scratchy sensation or mild soreness. DO NOT RUB YOUR EYE!  The day of surgery:  Minimal physical activity, but bed rest is not required  No reading, computer work, or close hand work  No bending, lifting, or straining.  May watch TV  For 24 hours:  No driving, legal decisions, or alcoholic beverages  Safety precautions  Eat anything you prefer: It is better to start with liquids, then soup then solid foods.  ____post op eye drop instructions discussed and given to pt  ____ Solar shield eyeglasses should be worn for comfort in the sunlight/patch while sleeping  Resume all regular medications including aspirin or Coumadin if these were discontinued prior to surgery. You may shower, bathe, shave, or wash your hair. Tylenol may be taken for mild discomfort.  Call your doctor if you experience significant pain, nausea, or vomiting, fever > 101 or other signs of infection. [phone] or [phone] Specific instructions:  Follow-up Information    Eulogio Bear, MD Follow up.   Specialty:  Ophthalmology Why:  June 28 at 10:30am at [facility] information: [place] [address] [phone]

## 2017-08-13 ENCOUNTER — Encounter: Payer: Self-pay | Admitting: Ophthalmology

## 2017-08-13 NOTE — Addendum Note (Signed)
Addendum  created 08/13/17 0929 by Vanecia Limpert, Einar Grad, CRNA   Charge Capture section accepted

## 2017-08-13 NOTE — Addendum Note (Signed)
Addendum  created 08/13/17 1151 by Doreen Salvage, CRNA   Charge Capture section accepted

## 2018-02-03 ENCOUNTER — Emergency Department: Payer: Medicare (Managed Care)

## 2018-02-03 ENCOUNTER — Inpatient Hospital Stay (HOSPITAL_COMMUNITY)
Admit: 2018-02-03 | Discharge: 2018-02-03 | Disposition: A | Discharge status: Home / Self Care | Payer: Medicare (Managed Care) | Attending: Internal Medicine | Admitting: Internal Medicine

## 2018-02-03 ENCOUNTER — Encounter: Payer: Self-pay | Admitting: Internal Medicine

## 2018-02-03 ENCOUNTER — Inpatient Hospital Stay
Admission: EM | Admit: 2018-02-03 | Discharge: 2018-02-10 | DRG: 190 | Disposition: A | Discharge status: Skilled Nursing Facility | Payer: Medicare (Managed Care) | Attending: Internal Medicine | Admitting: Internal Medicine

## 2018-02-03 ENCOUNTER — Other Ambulatory Visit: Payer: Self-pay

## 2018-02-03 DIAGNOSIS — J101 Influenza due to other identified influenza virus with other respiratory manifestations: Secondary | ICD-10-CM | POA: Diagnosis present

## 2018-02-03 DIAGNOSIS — I4821 Permanent atrial fibrillation: Secondary | ICD-10-CM | POA: Diagnosis present

## 2018-02-03 DIAGNOSIS — I5032 Chronic diastolic (congestive) heart failure: Secondary | ICD-10-CM | POA: Diagnosis present

## 2018-02-03 DIAGNOSIS — E782 Mixed hyperlipidemia: Secondary | ICD-10-CM | POA: Diagnosis present

## 2018-02-03 DIAGNOSIS — E860 Dehydration: Secondary | ICD-10-CM | POA: Diagnosis present

## 2018-02-03 DIAGNOSIS — Z7901 Long term (current) use of anticoagulants: Secondary | ICD-10-CM

## 2018-02-03 DIAGNOSIS — J9622 Acute and chronic respiratory failure with hypercapnia: Secondary | ICD-10-CM | POA: Diagnosis present

## 2018-02-03 DIAGNOSIS — E039 Hypothyroidism, unspecified: Secondary | ICD-10-CM | POA: Diagnosis present

## 2018-02-03 DIAGNOSIS — J9621 Acute and chronic respiratory failure with hypoxia: Secondary | ICD-10-CM | POA: Diagnosis present

## 2018-02-03 DIAGNOSIS — F419 Anxiety disorder, unspecified: Secondary | ICD-10-CM | POA: Diagnosis present

## 2018-02-03 DIAGNOSIS — J441 Chronic obstructive pulmonary disease with (acute) exacerbation: Principal | ICD-10-CM | POA: Diagnosis present

## 2018-02-03 DIAGNOSIS — Z79890 Hormone replacement therapy (postmenopausal): Secondary | ICD-10-CM

## 2018-02-03 DIAGNOSIS — I110 Hypertensive heart disease with heart failure: Secondary | ICD-10-CM | POA: Diagnosis present

## 2018-02-03 DIAGNOSIS — D751 Secondary polycythemia: Secondary | ICD-10-CM | POA: Diagnosis present

## 2018-02-03 DIAGNOSIS — F329 Major depressive disorder, single episode, unspecified: Secondary | ICD-10-CM | POA: Diagnosis present

## 2018-02-03 DIAGNOSIS — Z79899 Other long term (current) drug therapy: Secondary | ICD-10-CM

## 2018-02-03 DIAGNOSIS — Z87891 Personal history of nicotine dependence: Secondary | ICD-10-CM

## 2018-02-03 DIAGNOSIS — K219 Gastro-esophageal reflux disease without esophagitis: Secondary | ICD-10-CM | POA: Diagnosis present

## 2018-02-03 DIAGNOSIS — I4891 Unspecified atrial fibrillation: Secondary | ICD-10-CM

## 2018-02-03 DIAGNOSIS — Z9981 Dependence on supplemental oxygen: Secondary | ICD-10-CM

## 2018-02-03 DIAGNOSIS — J440 Chronic obstructive pulmonary disease with acute lower respiratory infection: Secondary | ICD-10-CM | POA: Diagnosis present

## 2018-02-03 DIAGNOSIS — E1165 Type 2 diabetes mellitus with hyperglycemia: Secondary | ICD-10-CM | POA: Diagnosis present

## 2018-02-03 DIAGNOSIS — T380X5A Adverse effect of glucocorticoids and synthetic analogues, initial encounter: Secondary | ICD-10-CM | POA: Diagnosis present

## 2018-02-03 DIAGNOSIS — J9620 Acute and chronic respiratory failure, unspecified whether with hypoxia or hypercapnia: Secondary | ICD-10-CM

## 2018-02-03 DIAGNOSIS — Z7951 Long term (current) use of inhaled steroids: Secondary | ICD-10-CM | POA: Diagnosis not present

## 2018-02-03 DIAGNOSIS — J989 Respiratory disorder, unspecified: Secondary | ICD-10-CM

## 2018-02-03 DIAGNOSIS — Z8673 Personal history of transient ischemic attack (TIA), and cerebral infarction without residual deficits: Secondary | ICD-10-CM | POA: Diagnosis not present

## 2018-02-03 DIAGNOSIS — R0602 Shortness of breath: Secondary | ICD-10-CM | POA: Diagnosis not present

## 2018-02-03 DIAGNOSIS — Z833 Family history of diabetes mellitus: Secondary | ICD-10-CM | POA: Diagnosis not present

## 2018-02-03 LAB — BASIC METABOLIC PANEL
Anion gap: 8 (ref 5–15)
BUN: 20 mg/dL (ref 8–23)
CO2: 28 mmol/L (ref 22–32)
Calcium: 9.1 mg/dL (ref 8.9–10.3)
Chloride: 103 mmol/L (ref 98–111)
Creatinine, Ser: 1 mg/dL (ref 0.44–1.00)
GFR calc Af Amer: >60 mL/min (ref >60)
GFR calc non Af Amer: 56 mL/min — ABNORMAL LOW (ref >60)
Glucose, Bld: 120 mg/dL — ABNORMAL HIGH (ref 70–99)
Potassium: 3.7 mmol/L (ref 3.5–5.1)
Sodium: 139 mmol/L (ref 135–145)

## 2018-02-03 LAB — CBC WITH DIFFERENTIAL/PLATELET
Abs Immature Granulocytes: 0.11 10*3/uL — ABNORMAL HIGH (ref 0.00–0.07)
Basophils Absolute: 0.1 10*3/uL (ref 0.0–0.1)
Basophils Relative: 1 %
Eosinophils Absolute: 0 10*3/uL (ref 0.0–0.5)
Eosinophils Relative: 0 %
HCT: 49.1 % — ABNORMAL HIGH (ref 36.0–46.0)
Hemoglobin: 15.3 g/dL — ABNORMAL HIGH (ref 12.0–15.0)
Immature Granulocytes: 1 %
Lymphocytes Relative: 18 %
Lymphs Abs: 2.2 10*3/uL (ref 0.7–4.0)
MCH: 30.6 pg (ref 26.0–34.0)
MCHC: 31.2 g/dL (ref 30.0–36.0)
MCV: 98.2 fL (ref 80.0–100.0)
Monocytes Absolute: 1.3 10*3/uL — ABNORMAL HIGH (ref 0.1–1.0)
Monocytes Relative: 10 %
Neutro Abs: 8.8 10*3/uL — ABNORMAL HIGH (ref 1.7–7.7)
Neutrophils Relative %: 70 %
Platelets: 287 10*3/uL (ref 150–400)
RBC: 5 MIL/uL (ref 3.87–5.11)
RDW: 14 % (ref 11.5–15.5)
WBC: 12.5 10*3/uL — ABNORMAL HIGH (ref 4.0–10.5)
nRBC: 0 % (ref 0.0–0.2)

## 2018-02-03 LAB — LACTIC ACID, PLASMA
Lactic Acid, Venous: 1.8 mmol/L (ref 0.5–1.9)
Lactic Acid, Venous: 1.2 mmol/L (ref 0.5–1.9)

## 2018-02-03 LAB — MRSA PCR SCREENING: MRSA by PCR: NEGATIVE

## 2018-02-03 LAB — ECHOCARDIOGRAM COMPLETE
Height: 62 in
Weight: 3152 oz

## 2018-02-03 LAB — INFLUENZA PANEL BY PCR (TYPE A & B)
Influenza A By PCR: POSITIVE — AB
Influenza B By PCR: NEGATIVE

## 2018-02-03 LAB — MAGNESIUM: Magnesium: 1.8 mg/dL (ref 1.7–2.4)

## 2018-02-03 LAB — HEPATIC FUNCTION PANEL
ALT: 15 U/L (ref 0–44)
AST: 27 U/L (ref 15–41)
Albumin: 3.9 g/dL (ref 3.5–5.0)
Alkaline Phosphatase: 53 U/L (ref 38–126)
Bilirubin, Direct: <0.1 mg/dL (ref 0.0–0.2)
Total Bilirubin: 1.1 mg/dL (ref 0.3–1.2)
Total Protein: 7.5 g/dL (ref 6.5–8.1)

## 2018-02-03 LAB — BRAIN NATRIURETIC PEPTIDE: B Natriuretic Peptide: 183 pg/mL — ABNORMAL HIGH (ref 0.0–100.0)

## 2018-02-03 LAB — GLUCOSE, CAPILLARY
Glucose-Capillary: 125 mg/dL — ABNORMAL HIGH (ref 70–99)
Glucose-Capillary: 149 mg/dL — ABNORMAL HIGH (ref 70–99)
Glucose-Capillary: 180 mg/dL — ABNORMAL HIGH (ref 70–99)
Glucose-Capillary: 148 mg/dL — ABNORMAL HIGH (ref 70–99)

## 2018-02-03 LAB — PROCALCITONIN: Procalcitonin: <0.1 ng/mL

## 2018-02-03 LAB — PHOSPHORUS
Phosphorus: 3.8 mg/dL (ref 2.5–4.6)
Phosphorus: 3.6 mg/dL (ref 2.5–4.6)

## 2018-02-03 LAB — TROPONIN I: Troponin I: <0.03 ng/mL (ref <0.03)

## 2018-02-03 MED ORDER — APIXABAN 5 MG PO TABS
5.0000 mg | ORAL_TABLET | Freq: Two times a day (BID) | ORAL | Status: DC
  Administered 2018-02-03 – 2018-02-10 (×15): 5 mg via ORAL
  Filled 2018-02-03 (×15): qty 1

## 2018-02-03 MED ORDER — PRAVASTATIN SODIUM 20 MG PO TABS
10.0000 mg | ORAL_TABLET | Freq: Every day | ORAL | Status: DC
  Administered 2018-02-05 – 2018-02-09 (×5): 10 mg via ORAL
  Filled 2018-02-03 (×5): qty 1

## 2018-02-03 MED ORDER — SENNOSIDES-DOCUSATE SODIUM 8.6-50 MG PO TABS: 1.0000 | ORAL_TABLET | Freq: Every evening | ORAL | Status: DC | PRN

## 2018-02-03 MED ORDER — ONDANSETRON HCL 4 MG/2ML IJ SOLN: 4.0000 mg | Freq: Four times a day (QID) | INTRAMUSCULAR | Status: DC | PRN

## 2018-02-03 MED ORDER — POTASSIUM CHLORIDE CRYS ER 20 MEQ PO TBCR
40.0000 meq | EXTENDED_RELEASE_TABLET | Freq: Two times a day (BID) | ORAL | Status: DC
  Administered 2018-02-03 – 2018-02-04 (×3): 40 meq via ORAL
  Filled 2018-02-03 (×3): qty 2

## 2018-02-03 MED ORDER — ALBUTEROL SULFATE (2.5 MG/3ML) 0.083% IN NEBU
2.5000 mg | INHALATION_SOLUTION | Freq: Once | RESPIRATORY_TRACT | Status: AC
  Administered 2018-02-03: 2.5 mg via RESPIRATORY_TRACT
  Filled 2018-02-03: qty 3

## 2018-02-03 MED ORDER — BISACODYL 5 MG PO TBEC
5.0000 mg | DELAYED_RELEASE_TABLET | Freq: Every day | ORAL | Status: DC | PRN
  Administered 2018-02-06: 5 mg via ORAL
  Filled 2018-02-03: qty 1

## 2018-02-03 MED ORDER — BUDESONIDE 0.5 MG/2ML IN SUSP
0.5000 mg | Freq: Two times a day (BID) | RESPIRATORY_TRACT | Status: DC
  Administered 2018-02-03 – 2018-02-10 (×15): 0.5 mg via RESPIRATORY_TRACT
  Filled 2018-02-03 (×16): qty 2

## 2018-02-03 MED ORDER — LACTATED RINGERS IV SOLN
INTRAVENOUS | Status: DC
  Administered 2018-02-03: 08:00:00 via INTRAVENOUS

## 2018-02-03 MED ORDER — PERFLUTREN LIPID MICROSPHERE
1.0000 mL | INTRAVENOUS | Status: AC | PRN
  Administered 2018-02-03: 2 mL via INTRAVENOUS
  Filled 2018-02-03: qty 10

## 2018-02-03 MED ORDER — BISOPROLOL FUMARATE 5 MG PO TABS
2.5000 mg | ORAL_TABLET | Freq: Two times a day (BID) | ORAL | Status: DC
  Administered 2018-02-03 – 2018-02-10 (×15): 2.5 mg via ORAL
  Filled 2018-02-03 (×17): qty 0.5

## 2018-02-03 MED ORDER — DILTIAZEM HCL ER COATED BEADS 120 MG PO CP24
240.0000 mg | ORAL_CAPSULE | Freq: Every day | ORAL | Status: DC
  Administered 2018-02-03 – 2018-02-10 (×8): 240 mg via ORAL
  Filled 2018-02-03 (×3): qty 1
  Filled 2018-02-03: qty 2
  Filled 2018-02-03 (×2): qty 1
  Filled 2018-02-03 (×2): qty 2

## 2018-02-03 MED ORDER — SODIUM CHLORIDE 0.9 % IV SOLN
1.0000 g | Freq: Once | INTRAVENOUS | Status: AC
  Administered 2018-02-03: 1 g via INTRAVENOUS
  Filled 2018-02-03: qty 10

## 2018-02-03 MED ORDER — CITALOPRAM HYDROBROMIDE 20 MG PO TABS
40.0000 mg | ORAL_TABLET | Freq: Every day | ORAL | Status: DC
  Administered 2018-02-03 – 2018-02-09 (×7): 40 mg via ORAL
  Filled 2018-02-03 (×7): qty 2

## 2018-02-03 MED ORDER — INSULIN ASPART 100 UNIT/ML ~~LOC~~ SOLN
0.0000 [IU] | Freq: Three times a day (TID) | SUBCUTANEOUS | Status: DC
  Administered 2018-02-03: 2 [IU] via SUBCUTANEOUS
  Administered 2018-02-03 – 2018-02-04 (×3): 1 [IU] via SUBCUTANEOUS
  Administered 2018-02-05: 2 [IU] via SUBCUTANEOUS
  Administered 2018-02-05 – 2018-02-06 (×3): 1 [IU] via SUBCUTANEOUS
  Administered 2018-02-06 – 2018-02-07 (×2): 2 [IU] via SUBCUTANEOUS
  Administered 2018-02-07 – 2018-02-08 (×4): 1 [IU] via SUBCUTANEOUS
  Administered 2018-02-08: 3 [IU] via SUBCUTANEOUS
  Administered 2018-02-09 (×2): 2 [IU] via SUBCUTANEOUS
  Filled 2018-02-03 (×17): qty 1

## 2018-02-03 MED ORDER — SODIUM CHLORIDE 0.9 % IV SOLN: 500.0000 mg | INTRAVENOUS | Status: DC

## 2018-02-03 MED ORDER — ACETAMINOPHEN 650 MG RE SUPP: 650.0000 mg | Freq: Four times a day (QID) | RECTAL | Status: DC | PRN

## 2018-02-03 MED ORDER — LEVOTHYROXINE SODIUM 50 MCG PO TABS
150.0000 ug | ORAL_TABLET | Freq: Every day | ORAL | Status: DC
  Administered 2018-02-03 – 2018-02-10 (×8): 150 ug via ORAL
  Filled 2018-02-03 (×7): qty 1

## 2018-02-03 MED ORDER — ACETAMINOPHEN 325 MG PO TABS
650.0000 mg | ORAL_TABLET | Freq: Four times a day (QID) | ORAL | Status: DC | PRN
  Administered 2018-02-08 – 2018-02-09 (×2): 650 mg via ORAL
  Filled 2018-02-03 (×2): qty 2

## 2018-02-03 MED ORDER — MAGNESIUM SULFATE 2 GM/50ML IV SOLN
2.0000 g | Freq: Once | INTRAVENOUS | Status: AC
  Administered 2018-02-03: 2 g via INTRAVENOUS
  Filled 2018-02-03: qty 50

## 2018-02-03 MED ORDER — ONDANSETRON HCL 4 MG PO TABS: 4.0000 mg | ORAL_TABLET | Freq: Four times a day (QID) | ORAL | Status: DC | PRN

## 2018-02-03 MED ORDER — METHYLPREDNISOLONE SODIUM SUCC 40 MG IJ SOLR
40.0000 mg | Freq: Two times a day (BID) | INTRAMUSCULAR | Status: DC
  Administered 2018-02-03 (×2): 40 mg via INTRAVENOUS
  Filled 2018-02-03 (×2): qty 1

## 2018-02-03 MED ORDER — SODIUM CHLORIDE 0.9 % IV SOLN
500.0000 mg | Freq: Once | INTRAVENOUS | Status: AC
  Administered 2018-02-03: 500 mg via INTRAVENOUS
  Filled 2018-02-03: qty 500

## 2018-02-03 MED ORDER — MOMETASONE FURO-FORMOTEROL FUM 200-5 MCG/ACT IN AERO
2.0000 | INHALATION_SPRAY | Freq: Two times a day (BID) | RESPIRATORY_TRACT | Status: DC
  Filled 2018-02-03: qty 8.8

## 2018-02-03 MED ORDER — PREDNISONE 10 MG PO TABS: 40.0000 mg | ORAL_TABLET | Freq: Every day | ORAL | Status: DC

## 2018-02-03 MED ORDER — METHYLPREDNISOLONE SODIUM SUCC 125 MG IJ SOLR: 60.0000 mg | Freq: Two times a day (BID) | INTRAMUSCULAR | Status: DC

## 2018-02-03 MED ORDER — MORPHINE SULFATE (PF) 2 MG/ML IV SOLN
2.0000 mg | INTRAVENOUS | Status: DC | PRN
  Administered 2018-02-04 – 2018-02-06 (×5): 2 mg via INTRAVENOUS
  Filled 2018-02-03 (×5): qty 1

## 2018-02-03 MED ORDER — CALCIUM CARBONATE-VITAMIN D 500-200 MG-UNIT PO TABS
1.0000 | ORAL_TABLET | Freq: Two times a day (BID) | ORAL | Status: DC
  Administered 2018-02-03 – 2018-02-10 (×15): 1 via ORAL
  Filled 2018-02-03 (×15): qty 1

## 2018-02-03 MED ORDER — QUETIAPINE FUMARATE 25 MG PO TABS
25.0000 mg | ORAL_TABLET | Freq: Every day | ORAL | Status: DC
  Administered 2018-02-03: 25 mg via ORAL
  Filled 2018-02-03: qty 1

## 2018-02-03 MED ORDER — METHYLPREDNISOLONE SODIUM SUCC 125 MG IJ SOLR
125.0000 mg | Freq: Once | INTRAMUSCULAR | Status: AC
  Administered 2018-02-03: 125 mg via INTRAVENOUS
  Filled 2018-02-03: qty 2

## 2018-02-03 MED ORDER — INSULIN ASPART 100 UNIT/ML ~~LOC~~ SOLN: 0.0000 [IU] | Freq: Every day | SUBCUTANEOUS | Status: DC

## 2018-02-03 MED ORDER — IPRATROPIUM-ALBUTEROL 0.5-2.5 (3) MG/3ML IN SOLN
3.0000 mL | RESPIRATORY_TRACT | Status: DC
  Administered 2018-02-03 – 2018-02-07 (×26): 3 mL via RESPIRATORY_TRACT
  Filled 2018-02-03 (×26): qty 3

## 2018-02-03 MED ORDER — FUROSEMIDE 20 MG PO TABS
20.0000 mg | ORAL_TABLET | Freq: Two times a day (BID) | ORAL | Status: DC
  Administered 2018-02-03 – 2018-02-04 (×2): 20 mg via ORAL
  Filled 2018-02-03 (×2): qty 1

## 2018-02-03 NOTE — ED Notes (Signed)
Pt daughter [name] left to go to work, asked to be contacted when pt has a room assignment.

## 2018-02-03 NOTE — ED Triage Notes (Addendum)
Pt arrived via ems from home with extreme SOB.EMS administered 4 duoneb treatments in route. Pt able to answer questions but labored breathing with accessory muscle use and expiratory wheezing, prolonged expiratory phase. MD at bedside and ordered bipap at this time. Respiratory contacted and now in room placing pt on bipap.

## 2018-02-03 NOTE — Progress Notes (Signed)
Summer Bradley at [place] NAME: Summer Bradley    MR#:  [id number]  DATE OF BIRTH:  [birth year]  SUBJECTIVE:   She admitted to the hospital secondary to shortness of breath and wheezing and noted to be in COPD exacerbation.  Much improved and now off BiPAP.  Patient's family is at bedside.  Still has some cough/wheezing but improved since yesterday.   REVIEW OF SYSTEMS:    Review of Systems  Constitutional: Negative for chills and fever.  HENT: Negative for congestion and tinnitus.   Eyes: Negative for blurred vision and double vision.  Respiratory: Positive for cough, shortness of breath and wheezing.   Cardiovascular: Negative for chest pain, orthopnea and PND.  Gastrointestinal: Negative for abdominal pain, diarrhea, nausea and vomiting.  Genitourinary: Negative for dysuria and hematuria.  Neurological: Negative for dizziness, sensory change and focal weakness.  All other systems reviewed and are negative.   Nutrition: Heart healthy/Carb control.  Tolerating Diet: Yes Tolerating PT: Await Eval.   DRUG ALLERGIES:   Allergies  Allergen Reactions  . Lisinopril Cough    VITALS:  Blood pressure 126/80, pulse 81, temperature 98.5 F (36.9 C), temperature source Axillary, resp. rate (!) 27, height 5\' 2"  (1.575 m), weight 89.4 kg, SpO2 94 %.  PHYSICAL EXAMINATION:   Physical Exam  GENERAL:  72 y.o.-year-old patient lying in bed in no acute distress.  EYES: Pupils equal, round, reactive to light and accommodation. No scleral icterus. Extraocular muscles intact.  HEENT: Head atraumatic, normocephalic. Oropharynx and nasopharynx clear.  NECK:  Supple, no jugular venous distention. No thyroid enlargement, no tenderness.  LUNGS: Good A/E B/l, diffuse end-exp. Wheezing b/l, No rales, rhonchi. No use of accessory muscles of respiration.  CARDIOVASCULAR: S1, S2 normal. No murmurs, rubs, or gallops.  ABDOMEN: Soft, nontender, nondistended.  Bowel sounds present. No organomegaly or mass.  EXTREMITIES: No cyanosis, clubbing or edema b/l.    NEUROLOGIC: Cranial nerves II through XII are intact. No focal Motor or sensory deficits b/l.   PSYCHIATRIC: The patient is alert and oriented x 3.  SKIN: No obvious rash, lesion, or ulcer.    LABORATORY PANEL:   CBC Recent Labs  Lab 02/03/18 0611  WBC 12.5*  HGB 15.3*  HCT 49.1*  PLT 287   ------------------------------------------------------------------------------------------------------------------  Chemistries  Recent Labs  Lab 02/03/18 0611  NA 139  K 3.7  CL 103  CO2 28  GLUCOSE 120*  BUN 20  CREATININE 1.00  CALCIUM 9.1  MG 1.8  AST 27  ALT 15  ALKPHOS 53  BILITOT 1.1   ------------------------------------------------------------------------------------------------------------------  Cardiac Enzymes Recent Labs  Lab 02/03/18 0611  TROPONINI <0.03   ------------------------------------------------------------------------------------------------------------------  RADIOLOGY:  Dg Chest Port 1 View  Result Date: 02/03/2018 CLINICAL DATA:  Shortness of breath. Wheezing. EXAM: PORTABLE CHEST 1 VIEW COMPARISON:  12/23/2015 FINDINGS: Cardiac enlargement without vascular congestion. Emphysematous changes in the upper lungs. Linear fibrosis or atelectasis in the lung bases is similar to previous study. No developing consolidation. No blunting of costophrenic angles. No pneumothorax. Mediastinal contours appear intact. Calcification of the aorta. Degenerative changes in the spine and shoulders. IMPRESSION: Emphysematous changes and fibrosis in the lungs. No evidence of active pulmonary disease. Cardiac enlargement. Electronically Signed   By: Lucienne Capers M.D.   On: 02/03/2018 06:30     ASSESSMENT AND PLAN:   72 year old female with hx of COPD, HTN, hx of previous CVA, A. Fib, Hypothyroidism, depression, hyperlipidemia, GERD who presented  to the hospital due  to shortness of breath wheezing and respiratory distress noted to be in COPD exacerbation.  1.  Acute on chronic respiratory failure with hypoxia-secondary to COPD exacerbation. - Improved, off BiPAP.  Continue IV steroids, scheduled duo nebs, Pulmicort nebs  2.  COPD exacerbation-suspected to be secondary to underlying bronchitis. - Much improved and weaned off BiPAP.  Continue IV steroids, scheduled duo nebs, Pulmicort nebs. -Patient will to be assessed for home oxygen prior to discharge.  3.  History of atrial fibrillation-rate controlled.  Continue Cardizem. -Continue Eliquis.    4.  Depression-continue Celexa.  5.  History of chronic diastolic CHF - cont. Bisoprolol, lasix. Clinically not in CHF.   6. DM type II -cont. SSI and follow BS  7. Hypothyroidism - cont. Synthroid.   8. Hyperlipidemia - cont. Pravachol.    All the records are reviewed and case discussed with Care Management/Social Worker. Management plans discussed with the patient, family and they are in agreement.  CODE STATUS: Full code  DVT Prophylaxis: Eliquis  TOTAL TIME TAKING CARE OF THIS PATIENT: 30 minutes.   POSSIBLE D/C IN 1-2 DAYS, DEPENDING ON CLINICAL CONDITION.   Henreitta Leber M.D on 02/03/2018 at 3:53 PM  Between 7am to 6pm - Pager - [phone]  After 6pm go to [email or web address] - [facility]  Office  [phone]  CC: Primary care physician; [place]

## 2018-02-03 NOTE — ED Notes (Signed)
One set of cultures drawn at this time

## 2018-02-03 NOTE — Progress Notes (Signed)
   02/03/18 1035  Clinical Encounter Type  Visited With Patient and family together  Visit Type Initial  Spiritual Encounters  Spiritual Needs Emotional   Chaplain introduced self to patient as she noticed family in room was person chaplain had encountered in hallway earlier.  Chaplain provided education on ongoing chaplain availability and encouraged them to have staff page as needed.

## 2018-02-03 NOTE — Care Management Note (Signed)
Case Management Note  Patient Details  Name: Summer Bradley MRN: [id number] Date of Birth: January 24, 1946  Subjective/Objective:   Patient admitted with acute on chronic respiratory failure with hypoxemia requiring Bipap.  Patient is part of [facility].  [facility] is aware of patient's admission and [facility] nurse came up to visit today.   Daughter is at the bedside, Bipap was taken off and patient tolerating [place] at the present.  Patient reports that she lives in [place] with her son, and her daughter lives very close by.  During the week [facility] sends aide services out to the home Monday, Wed, and Fri, and they assist with bathing, dressing, light cleaning and some cooking.  Tuesday and Thursday the patient goes to [facility] for the day for socialization and activities.  Patient is on Chronic O2 at 2L, [facility] provides the oxygen.  Patient does not drive [facility] also provides transportation.   No needs identified at this time- [facility] will provide follow up care and any outpatient services that may be needed after discharge. Doran Clay RN BSN [phone]                Action/Plan:   Expected Discharge Date:                  Expected Discharge Plan:     In-House Referral:     Discharge planning Services     Post Acute Care Choice:    Choice offered to:     DME Arranged:    DME Agency:     HH Arranged:    HH Agency:     Status of Service:     If discussed at [place] of Stay Meetings, dates discussed:    Additional Comments:  Shelbie Hutching, RN 02/03/2018, 4:42 PM

## 2018-02-03 NOTE — Progress Notes (Addendum)
Pharmacy Electrolyte Monitoring Consult:  Pharmacy consulted to assist in monitoring and replacing electrolytes in this 72 y.o. female admitted on 02/03/2018 with Shortness of Breath   Labs:  Sodium (mmol/L)  Date Value  02/03/2018 139  01/14/2014 138   Potassium (mmol/L)  Date Value  02/03/2018 3.7  01/14/2014 5.0   Magnesium (mg/dL)  Date Value  02/03/2018 1.8  09/22/2013 1.8   Phosphorus (mg/dL)  Date Value  02/03/2018 3.8   Calcium (mg/dL)  Date Value  02/03/2018 9.1   Calcium, Total (mg/dL)  Date Value  01/14/2014 8.8   Albumin (g/dL)  Date Value  02/03/2018 3.9  01/10/2014 3.1 (L)    Assessment/Plan: Patient is ordered furosemide 20mg  PO BID and potassium 17mEq PO BID. Patient received magnesium 2g IV x 1 in ED.   Will obtain follow up electrolytes with am labs.   Pharmacy will continue to monitor and adjust per consult.    Simpson,Michael L 02/03/2018 2:25 PM

## 2018-02-03 NOTE — ED Notes (Signed)
Lab at bedside to complete second set of cultures and lactic at this time

## 2018-02-03 NOTE — Progress Notes (Signed)
*  PRELIMINARY RESULTS* Echocardiogram 2D Echocardiogram has been performed. Definity image enhancer was administered.  Summer Bradley Granite Godman 02/03/2018, 10:36 AM

## 2018-02-03 NOTE — Progress Notes (Signed)
RT assisted with patient transport from ER to ICU while she was on the V60. No complications noted.
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
## 2018-02-03 NOTE — ED Notes (Signed)
ED TO INPATIENT HANDOFF REPORT  Name/Age/Gender Summer Bradley 72 y.o. female  Code Status Code Status History    Date Active Date Inactive Code Status Order ID Comments User Context   02/02/2014 0933 03/05/2014 1624 Full Code [id number]  Wilhelmina Mcardle, MD Inpatient   02/01/2014 1236 02/02/2014 0933 Full Code [id number]  Rob Hickman, MD Inpatient   01/29/2014 1119 02/01/2014 1236 Full Code [id number]  Roland Rack, MD Inpatient      Home/SNF/Other Home  Chief Complaint [facility] - Shortness of breath  Level of Care/Admitting Diagnosis ED Disposition    ED Disposition Condition [place]: [place] [[id number]]  Level of Care: Stepdown [14]  Diagnosis: Acute on chronic respiratory failure with hypoxemia [facility]) [[id number]]  Admitting Physician: Arta Silence [[id number]]  Attending Physician: Arta Silence [[id number]]  Estimated length of stay: past midnight tomorrow  Certification:: I certify this patient will need inpatient services for at least 2 midnights  PT Class (Do Not Modify): Inpatient [101]  PT Acc Code (Do Not Modify): Private [1]       Medical History Past Medical History:  Diagnosis Date  . A-fib ([place])   . Anxiety   . COPD (chronic obstructive pulmonary disease) ([place])   . Cough    CHONIC  . Depression   . Dysrhythmia   . GERD (gastroesophageal reflux disease)   . Heart disease   . High cholesterol   . Hypertension   . Hypothyroidism   . Orthopnea   . Oxygen dependent    2/L CONTINUOUSLY  . Stroke ([place])   . Vertigo   . Wheezing     Allergies Allergies  Allergen Reactions  . Lisinopril Cough    IV Location/Drains/Wounds Patient Lines/Drains/Airways Status   Active Line/Drains/Airways    Name:   Placement date:   Placement time:   Site:   Days:   Peripheral IV 02/03/18 Left Arm   02/03/18    0605    Arm   less than 1   Peripheral IV 02/03/18 Right Forearm   02/03/18    0614     Forearm   less than 1   Incision (Closed) 05/27/17 Eye Right   05/27/17    0814     252   Incision (Closed) 08/12/17 Eye Left   08/12/17    0845     175          Labs/Imaging Results for orders placed or performed during the hospital encounter of 02/03/18 (from the past 48 hour(s))  CBC with Differential     Status: Abnormal   Collection Time: 02/03/18  6:11 AM  Result Value Ref [place]   WBC 12.5 (H) 4.0 - 10.5 K/uL   RBC 5.00 3.87 - 5.11 MIL/uL   Hemoglobin 15.3 (H) 12.0 - 15.0 g/dL   HCT 49.1 (H) 36.0 - 46.0 %   MCV 98.2 80.0 - 100.0 fL   MCH 30.6 26.0 - 34.0 pg   MCHC 31.2 30.0 - 36.0 g/dL   RDW 14.0 11.5 - 15.5 %   Platelets 287 150 - 400 K/uL   nRBC 0.0 0.0 - 0.2 %   Neutrophils Relative % 70 %   Neutro Abs 8.8 (H) 1.7 - 7.7 K/uL   Lymphocytes Relative 18 %   Lymphs Abs 2.2 0.7 - 4.0 K/uL   Monocytes Relative 10 %   Monocytes Absolute 1.3 (H) 0.1 - 1.0 K/uL   Eosinophils Relative 0 %  Eosinophils Absolute 0.0 0.0 - 0.5 K/uL   Basophils Relative 1 %   Basophils Absolute 0.1 0.0 - 0.1 K/uL   Immature Granulocytes 1 %   Abs Immature Granulocytes 0.11 (H) 0.00 - 0.07 K/uL    Comment: Performed at [facility], [address] [place], [place] [address]  Basic metabolic panel     Status: Abnormal   Collection Time: 02/03/18  6:11 AM  Result Value Ref [place]   Sodium 139 135 - 145 mmol/L   Potassium 3.7 3.5 - 5.1 mmol/L   Chloride 103 98 - 111 mmol/L   CO2 28 22 - 32 mmol/L   Glucose, Bld 120 (H) 70 - 99 mg/dL   BUN 20 8 - 23 mg/dL   Creatinine, Ser 1.00 0.44 - 1.00 mg/dL   Calcium 9.1 8.9 - 10.3 mg/dL   GFR calc non Af Amer 56 (L) >60 mL/min   GFR calc Af Amer >60 >60 mL/min   Anion gap 8 5 - 15    Comment: Performed at [facility], [address] [place], [place] [address]  Brain natriuretic peptide     Status: Abnormal   Collection Time: 02/03/18  6:11 AM  Result Value Ref [place]   B Natriuretic Peptide 183.0 (H) 0.0 - 100.0 pg/mL     Comment: Performed at [facility], [address] [place], [place] [address]  Troponin I - Once     Status: None   Collection Time: 02/03/18  6:11 AM  Result Value Ref [place]   Troponin I <0.03 <0.03 ng/mL    Comment: Performed at [facility], [address] [place], [place] [address]  Blood gas, venous     Status: Abnormal (Preliminary result)   Collection Time: 02/03/18  6:11 AM  Result Value Ref Range   FIO2 0.35    Delivery systems BILEVEL POSITIVE AIRWAY PRESSURE    pH, Ven 7.35 7.250 - 7.430   pCO2, Ven 57 44.0 - 60.0 mmHg   pO2, Ven PENDING 32.0 - 45.0 mmHg   Bicarbonate 31.5 (H) 20.0 - 28.0 mmol/L   Acid-Base Excess 4.3 (H) 0.0 - 2.0 mmol/L   O2 Saturation 55.7 %   Patient temperature 37.0    Collection site LINE    Sample type VENOUS     Comment: Performed at [facility], [address] [place], [place] [address]  Lactic acid, plasma     Status: None   Collection Time: 02/03/18  6:11 AM  Result Value Ref Range   Lactic Acid, Venous 1.8 0.5 - 1.9 mmol/L    Comment: Performed at [facility], [address] [place], [place] [address]  Hepatic function panel     Status: None   Collection Time: 02/03/18  6:11 AM  Result Value Ref [place]   Total Protein 7.5 6.5 - 8.1 g/dL   Albumin 3.9 3.5 - 5.0 g/dL   AST 27 15 - 41 U/L   ALT 15 0 - 44 U/L   Alkaline Phosphatase 53 38 - 126 U/L   Total Bilirubin 1.1 0.3 - 1.2 mg/dL   Bilirubin, Direct <0.1 0.0 - 0.2 mg/dL   Indirect Bilirubin NOT CALCULATED 0.3 - 0.9 mg/dL    Comment: Performed at [facility], [address] [place], [place] [address]  Magnesium     Status: None   Collection Time: 02/03/18  6:11 AM  Result Value Ref Range   Magnesium 1.8 1.7 - 2.4 mg/dL  Comment: Performed at [facility], [address] [place], [place] [address]  Phosphorus     Status: None   Collection Time: 02/03/18  6:11 AM  Result Value Ref Range   Phosphorus 3.8 2.5 - 4.6  mg/dL    Comment: Performed at [facility], [place], [place] [address]  Lactic acid, plasma     Status: None   Collection Time: 02/03/18  6:37 AM  Result Value Ref [place]   Lactic Acid, Venous 1.2 0.5 - 1.9 mmol/L    Comment: Performed at [facility], [address] [place], [place] [address]   Dg Chest Port 1 View  Result Date: 02/03/2018 CLINICAL DATA:  Shortness of breath. Wheezing. EXAM: PORTABLE CHEST 1 VIEW COMPARISON:  12/23/2015 FINDINGS: Cardiac enlargement without vascular congestion. Emphysematous changes in the upper lungs. Linear fibrosis or atelectasis in the lung bases is similar to previous study. No developing consolidation. No blunting of costophrenic angles. No pneumothorax. Mediastinal contours appear intact. Calcification of the aorta. Degenerative changes in the spine and shoulders. IMPRESSION: Emphysematous changes and fibrosis in the lungs. No evidence of active pulmonary disease. Cardiac enlargement. Electronically Signed   By: Lucienne Capers M.D.   On: 02/03/2018 06:30    Pending Labs Unresulted Labs (From admission, onward)    Start     Ordered   02/03/18 0845  Procalcitonin - Baseline  Add-on,   AD     02/03/18 0844   02/03/18 0650  Urinalysis, Routine w reflex microscopic  Once,   STAT     02/03/18 0649   02/03/18 0617  Blood culture (routine x 2)  BLOOD CULTURE X 2,   STAT     02/03/18 0616          Vitals/Pain Today's Vitals   02/03/18 0800 02/03/18 0831 02/03/18 0855 02/03/18 0858  BP: 123/84 (Abnormal) 133/102 (Abnormal) 131/107   Pulse: 88 88 88   Resp: (Abnormal) 21 (Abnormal) 28 (Abnormal) 29   Temp:      TempSrc:      SpO2: 96% 96% 95%   Weight:      Height:      PainSc:    0-No pain    Isolation Precautions No active isolations  Medications Medications  lactated ringers infusion ( Intravenous New Bag/Given 02/03/18 0739)  methylPREDNISolone sodium succinate (SOLU-MEDROL) 125 mg/2 mL injection  60 mg (60 mg Intravenous Not Given 02/03/18 0731)    Followed by  predniSONE (DELTASONE) tablet 40 mg (has no administration in time range)  azithromycin (ZITHROMAX) 500 mg in sodium chloride 0.9 % 250 mL IVPB (has no administration in time range)  ipratropium-albuterol (DUONEB) 0.5-2.5 (3) MG/3ML nebulizer solution 3 mL (has no administration in time range)  methylPREDNISolone sodium succinate (SOLU-MEDROL) 125 mg/2 mL injection 125 mg (125 mg Intravenous Given 02/03/18 0606)  albuterol (PROVENTIL) (2.5 MG/3ML) 0.083% nebulizer solution 2.5 mg (2.5 mg Nebulization Given 02/03/18 0610)  magnesium sulfate IVPB 2 g 50 mL (0 g Intravenous Stopped 02/03/18 0655)  cefTRIAXone (ROCEPHIN) 1 g in sodium chloride 0.9 % 100 mL IVPB (0 g Intravenous Stopped 02/03/18 0731)  azithromycin (ZITHROMAX) 500 mg in sodium chloride 0.9 % 250 mL IVPB (0 mg Intravenous Stopped 02/03/18 0805)    Mobility walks

## 2018-02-03 NOTE — Consult Note (Signed)
CRITICAL CARE NOTE Referring Attending Sainani   CC  Respiratory failure  SUBJECTIVE  72 yo with end stage COPD Chronic oxygen therapy Admitted for progressive increased  WOB and SOB for last several days +wheezing +low grade fevers +lower ext swelling +cough for last several days +lethargy and weakness  Placed on biPAP for resp distress Seems to be helping Daughters at bedside updated    SIGNIFICANT EVENTS 12/19 admitted to ICU for biPAP   BP 126/80   Pulse 81   Temp 98.5 F (36.9 C) (Axillary)   Resp (!) 27   Ht 5\' 2"  (1.575 m)   Wt 89.4 kg   SpO2 94%   BMI 36.03 kg/m    REVIEW OF SYSTEMS   Review of Systems:  Gen: +fever, s-weats, -chills -weight loss  HEENT: Denies blurred vision, double vision, ear pain, eye pain, hearing loss, nose bleeds, sore throat Cardiac:  No dizziness, chest pain or heaviness, chest tightness,edema, No JVD Resp:   +cough, -sputum production, +shortness of breath,+wheezing, -hemoptysis,  Gi: Denies swallowing difficulty, stomach pain, nausea or vomiting, diarrhea, constipation, bowel incontinence Gu:  Denies bladder incontinence, burning urine Ext:   Denies Joint pain, stiffness or swelling Skin: Denies  skin rash, easy bruising or bleeding or hives Endoc:  Denies polyuria, polydipsia , polyphagia or weight change Psych:   Denies depression, insomnia or hallucinations  Other:  All other systems negative  PHYSICAL EXAMINATION:  GENERAL:critically ill appearing, +resp distress HEAD: Normocephalic, atraumatic.  EYES: Pupils equal, round, reactive to light.  No scleral icterus.  MOUTH: Moist mucosal membrane. NECK: Supple. No thyromegaly. No nodules. No JVD.  PULMONARY: +rhonchi, +wheezing CARDIOVASCULAR: S1 and S2. Regular rate and rhythm. No murmurs, rubs, or gallops.  GASTROINTESTINAL: Soft, nontender, -distended. No masses. Positive bowel sounds. No hepatosplenomegaly.  MUSCULOSKELETAL: No swelling, clubbing, or edema.   NEUROLOGIC: alert and awake SKIN:intact,warm,dry      ASSESSMENT AND PLAN SYNOPSIS   Severe Hypoxic and Hypercapnic Respiratory Failure from COPD exacerbation-continue biPAP -continue Bronchodilator Therapy -Wean Fio2 and PEEP as tolerated   SEVERE COPD EXACERBATION -continue IV steroids as prescribed -continue NEB THERAPY as prescribed -morphine as needed -wean fio2 as needed and tolerated Check for FLU   Possible systolic CARDIAC FAILURE-check ECHO -oxygen as needed -Lasix as tolerated -follow up cardiac enzymes as indicated    CARDIAC ICU monitoring  ID -stop abx  -follow up cultures Check for FLU pro calcitonin WNL  GI/Nutrition GI PROPHYLAXIS as indicated DIET-->NPO for now Constipation protocol as indicated  ENDO - ICU hypoglycemic\Hyperglycemia protocol -check FSBS per protocol   ELECTROLYTES -follow labs as needed -replace as needed -pharmacy consultation and following   DVT/GI PRX ordered TRANSFUSIONS AS NEEDED MONITOR FSBS ASSESS the need for LABS as needed   Critical Care Time devoted to patient care services described in this note is 39 minutes.   Overall, patient is critically ill, prognosis is guarded.  Patient with Multiorgan failure and at high risk for cardiac arrest and death.    Corrin Parker, M.D.  [facility]  Medical Director [place] Director [facility] Cardio-Pulmonary Department

## 2018-02-04 LAB — BASIC METABOLIC PANEL
Anion gap: 8 (ref 5–15)
BUN: 25 mg/dL — ABNORMAL HIGH (ref 8–23)
CO2: 25 mmol/L (ref 22–32)
Calcium: 8.8 mg/dL — ABNORMAL LOW (ref 8.9–10.3)
Chloride: 109 mmol/L (ref 98–111)
Creatinine, Ser: 0.77 mg/dL (ref 0.44–1.00)
GFR calc Af Amer: >60 mL/min (ref >60)
GFR calc non Af Amer: >60 mL/min (ref >60)
Glucose, Bld: 147 mg/dL — ABNORMAL HIGH (ref 70–99)
Potassium: 4.7 mmol/L (ref 3.5–5.1)
Sodium: 142 mmol/L (ref 135–145)

## 2018-02-04 LAB — URINALYSIS, ROUTINE W REFLEX MICROSCOPIC
Bilirubin Urine: NEGATIVE
Glucose, UA: NEGATIVE mg/dL
Hgb urine dipstick: NEGATIVE
Ketones, ur: NEGATIVE mg/dL
Leukocytes, UA: NEGATIVE
Nitrite: NEGATIVE
Protein, ur: NEGATIVE mg/dL
Specific Gravity, Urine: 1.029 (ref 1.005–1.030)
Squamous Epithelial / HPF: NONE SEEN (ref 0–5)
pH: 5 (ref 5.0–8.0)

## 2018-02-04 LAB — MAGNESIUM: Magnesium: 2.5 mg/dL — ABNORMAL HIGH (ref 1.7–2.4)

## 2018-02-04 LAB — GLUCOSE, CAPILLARY
Glucose-Capillary: 123 mg/dL — ABNORMAL HIGH (ref 70–99)
Glucose-Capillary: 143 mg/dL — ABNORMAL HIGH (ref 70–99)
Glucose-Capillary: 132 mg/dL — ABNORMAL HIGH (ref 70–99)

## 2018-02-04 MED ORDER — METHYLPREDNISOLONE SODIUM SUCC 40 MG IJ SOLR
20.0000 mg | Freq: Once | INTRAMUSCULAR | Status: AC
  Administered 2018-02-04: 20 mg via INTRAVENOUS
  Filled 2018-02-04: qty 1

## 2018-02-04 MED ORDER — QUETIAPINE FUMARATE 25 MG PO TABS
50.0000 mg | ORAL_TABLET | Freq: Every day | ORAL | Status: DC
  Administered 2018-02-04 – 2018-02-09 (×6): 50 mg via ORAL
  Filled 2018-02-04 (×6): qty 2

## 2018-02-04 MED ORDER — METHYLPREDNISOLONE SODIUM SUCC 40 MG IJ SOLR
20.0000 mg | Freq: Two times a day (BID) | INTRAMUSCULAR | Status: DC
  Administered 2018-02-04 – 2018-02-08 (×8): 20 mg via INTRAVENOUS
  Filled 2018-02-04 (×8): qty 1

## 2018-02-04 MED ORDER — OSELTAMIVIR PHOSPHATE 75 MG PO CAPS: 75.0000 mg | ORAL_CAPSULE | Freq: Two times a day (BID) | ORAL | Status: DC

## 2018-02-04 MED ORDER — OSELTAMIVIR PHOSPHATE 30 MG PO CAPS
30.0000 mg | ORAL_CAPSULE | Freq: Once | ORAL | Status: AC
  Administered 2018-02-04: 30 mg via ORAL
  Filled 2018-02-04: qty 1

## 2018-02-04 MED ORDER — OSELTAMIVIR PHOSPHATE 30 MG PO CAPS
30.0000 mg | ORAL_CAPSULE | Freq: Two times a day (BID) | ORAL | Status: DC
  Administered 2018-02-04: 30 mg via ORAL
  Filled 2018-02-04 (×2): qty 1

## 2018-02-04 MED ORDER — FUROSEMIDE 10 MG/ML IJ SOLN
20.0000 mg | Freq: Two times a day (BID) | INTRAMUSCULAR | Status: DC
  Administered 2018-02-04 – 2018-02-07 (×6): 20 mg via INTRAVENOUS
  Filled 2018-02-04 (×6): qty 2

## 2018-02-04 MED ORDER — OSELTAMIVIR PHOSPHATE 75 MG PO CAPS
75.0000 mg | ORAL_CAPSULE | Freq: Two times a day (BID) | ORAL | Status: DC
  Administered 2018-02-04 – 2018-02-05 (×3): 75 mg via ORAL
  Filled 2018-02-04 (×4): qty 1

## 2018-02-04 NOTE — Progress Notes (Signed)
Pharmacy Electrolyte Monitoring Consult:  Pharmacy consulted to assist in monitoring and replacing electrolytes in this 72 y.o. female admitted on 02/03/2018 with Shortness of Breath   Labs:  Sodium (mmol/L)  Date Value  02/04/2018 142  01/14/2014 138   Potassium (mmol/L)  Date Value  02/04/2018 4.7  01/14/2014 5.0   Magnesium (mg/dL)  Date Value  02/04/2018 2.5 (H)  09/22/2013 1.8   Phosphorus (mg/dL)  Date Value  02/03/2018 3.6   Calcium (mg/dL)  Date Value  02/04/2018 8.8 (L)   Calcium, Total (mg/dL)  Date Value  01/14/2014 8.8   Albumin (g/dL)  Date Value  02/03/2018 3.9  01/10/2014 3.1 (L)    Assessment/Plan: Patient ordered furosemide 20mg  PO BID and potassium 38mEq PO BID. Will hold potassium and obtain follow up electrolytes with am labs.   Pharmacy will continue to monitor and adjust per consult.   Summer Bradley L 02/04/2018 4:00 PM

## 2018-02-04 NOTE — Care Management Note (Signed)
Case Management Note  Patient Details  Name: Summer Bradley MRN: [id number] Date of Birth: Jan 15, 1946  Subjective/Objective:     Patient is currently stepdown status.  Patient is a member of [place] nurse in to visit today.  Saul Fordyce is on call this weekend with [facility] if [facility] needs any discharge assistance- her number is [phone]. Doran Clay RN BSN [phone]                Action/Plan:   Expected Discharge Date:                  Expected Discharge Plan:     In-House Referral:     Discharge planning Services     Post Acute Care Choice:    Choice offered to:     DME Arranged:    DME Agency:     HH Arranged:    HH Agency:     Status of Service:     If discussed at [place] of Stay Meetings, dates discussed:    Additional Comments:  Shelbie Hutching, RN 02/04/2018, 11:07 AM

## 2018-02-04 NOTE — Consult Note (Signed)
CRITICAL CARE NOTE Referring Attending Sainani   CC  Follow up resp failure  SUBJECTIVE Copd exacerbation On biPAP last night +wheezing Less SOB  Alert and awake     SIGNIFICANT EVENTS 12/19 admitted to ICU for biPAP 12/20 on biPAP  BP 125/78   Pulse 73   Temp 98.8 F (37.1 C) (Axillary)   Resp (!) 22   Ht 5\' 2"  (1.575 m)   Wt 89.4 kg   SpO2 93%   BMI 36.03 kg/m     Review of Systems:  Gen:  Denies  fever, sweats, chills weigh loss  HEENT: Denies blurred vision, double vision, ear pain, eye pain, hearing loss, nose bleeds, sore throat Cardiac:  No dizziness, chest pain or heaviness, chest tightness,edema, No JVD Resp:   -sputum production, +shortness of breath,+wheezing, -hemoptysis,  Gi: Denies swallowing difficulty, stomach pain, nausea or vomiting, diarrhea, constipation, bowel incontinence Gu:  Denies bladder incontinence, burning urine Ext:   Denies Joint pain, stiffness or swelling Skin: Denies  skin rash, easy bruising or bleeding or hives Endoc:  Denies polyuria, polydipsia , polyphagia or weight change Psych:   Denies depression, insomnia or hallucinations  Other:  All other systems negative  Physical Examination:   GENERAL:NAD, no fevers, chills, no weakness no fatigue HEAD: Normocephalic, atraumatic.  EYES: Pupils equal, round, reactive to light. Extraocular muscles intact. No scleral icterus.  MOUTH: Moist mucosal membrane. Dentition intact. No abscess noted.  EAR, NOSE, THROAT: Clear without exudates. No external lesions.  NECK: Supple. No thyromegaly. No nodules. No JVD.  PULMONARY: CTA B/L+wheezing, CARDIOVASCULAR: S1 and S2. Regular rate and rhythm. No murmurs, rubs, or gallops. No edema. Pedal pulses 2+ bilaterally.  GASTROINTESTINAL: Soft, nontender, nondistended. No masses. Positive bowel sounds. No hepatosplenomegaly.  MUSCULOSKELETAL: No swelling, clubbing, or edema. Range of motion full in all extremities.  NEUROLOGIC: Cranial nerves  II through XII are intact. No gross focal neurological deficits. Sensation intact. Reflexes intact.  SKIN: No ulceration, lesions, rashes, or cyanosis. Skin warm and dry. Turgor intact.  PSYCHIATRIC: Mood, affect within normal limits. The patient is awake, alert and oriented x 3. Insight, judgment intact.  ALL OTHER ROS ARE NEGATIVE         ASSESSMENT AND PLAN SYNOPSIS  SEVERE COPD EXACERBATION from INFL A -continue IV steroids as prescribed -continue NEB THERAPY as prescribed -morphine as needed -wean fio2 as needed and tolerated Morphine as needed  Severe Hypoxic and Hypercapnic Respiratory Failure --wean off biPAP as tolerated   ECHO EF 55%   CARDIAC ICU monitoring  ID +INFL A Out of window period to start Tamiflu    ELECTROLYTES -follow labs as needed -replace as needed -pharmacy consultation and following    DVT/GI PRX ordered TRANSFUSIONS AS NEEDED MONITOR FSBS ASSESS the need for LABS as needed      Corrin Parker, M.D.  [facility]  Medical Director [place] Director [facility] Cardio-Pulmonary Department

## 2018-02-04 NOTE — Progress Notes (Signed)
[place] at [place] NAME: Summer Bradley    MR#:  [id number]  DATE OF BIRTH:  1945-06-26  SUBJECTIVE:   Still having some wheezing/shortness of breath and cough.  Patient is off BiPAP now.  Noted to be positive for influenza A yesterday.  REVIEW OF SYSTEMS:    Review of Systems  Constitutional: Negative for chills and fever.  HENT: Negative for congestion and tinnitus.   Eyes: Negative for blurred vision and double vision.  Respiratory: Positive for cough, shortness of breath and wheezing.   Cardiovascular: Negative for chest pain, orthopnea and PND.  Gastrointestinal: Negative for abdominal pain, diarrhea, nausea and vomiting.  Genitourinary: Negative for dysuria and hematuria.  Neurological: Negative for dizziness, sensory change and focal weakness.  All other systems reviewed and are negative.   Nutrition: Heart healthy/Carb control.  Tolerating Diet: Yes Tolerating PT: Await Eval.   DRUG ALLERGIES:   Allergies  Allergen Reactions  . Lisinopril Cough    VITALS:  Blood pressure 113/84, pulse 84, temperature 98.1 F (36.7 C), temperature source Oral, resp. rate (!) 22, height 5\' 2"  (1.575 m), weight 89.4 kg, SpO2 94 %.  PHYSICAL EXAMINATION:   Physical Exam  GENERAL:  72 y.o.-year-old patient sitting up in bed in no acute distress.  EYES: Pupils equal, round, reactive to light and accommodation. No scleral icterus. Extraocular muscles intact.  HEENT: Head atraumatic, normocephalic. Oropharynx and nasopharynx clear.  NECK:  Supple, no jugular venous distention. No thyroid enlargement, no tenderness.  LUNGS: Poor A/e B/l.  Diffused exp. Wheezing b/l, No rales, rhonchi. No use of accessory muscles of respiration.  CARDIOVASCULAR: S1, S2 normal. No murmurs, rubs, or gallops.  ABDOMEN: Soft, nontender, nondistended. Bowel sounds present. No organomegaly or mass.  EXTREMITIES: No cyanosis, clubbing or edema b/l.      NEUROLOGIC: Cranial nerves II through XII are intact. No focal Motor or sensory deficits b/l.   PSYCHIATRIC: The patient is alert and oriented x 3.  SKIN: No obvious rash, lesion, or ulcer.    LABORATORY PANEL:   CBC Recent Labs  Lab 02/03/18 0611  WBC 12.5*  HGB 15.3*  HCT 49.1*  PLT 287   ------------------------------------------------------------------------------------------------------------------  Chemistries  Recent Labs  Lab 02/03/18 0611 02/04/18 0430  NA 139 142  K 3.7 4.7  CL 103 109  CO2 28 25  GLUCOSE 120* 147*  BUN 20 25*  CREATININE 1.00 0.77  CALCIUM 9.1 8.8*  MG 1.8 2.5*  AST 27  --   ALT 15  --   ALKPHOS 53  --   BILITOT 1.1  --    ------------------------------------------------------------------------------------------------------------------  Cardiac Enzymes Recent Labs  Lab 02/03/18 0611  TROPONINI <0.03   ------------------------------------------------------------------------------------------------------------------  RADIOLOGY:  Dg Chest Port 1 View  Result Date: 02/03/2018 CLINICAL DATA:  Shortness of breath. Wheezing. EXAM: PORTABLE CHEST 1 VIEW COMPARISON:  12/23/2015 FINDINGS: Cardiac enlargement without vascular congestion. Emphysematous changes in the upper lungs. Linear fibrosis or atelectasis in the lung bases is similar to previous study. No developing consolidation. No blunting of costophrenic angles. No pneumothorax. Mediastinal contours appear intact. Calcification of the aorta. Degenerative changes in the spine and shoulders. IMPRESSION: Emphysematous changes and fibrosis in the lungs. No evidence of active pulmonary disease. Cardiac enlargement. Electronically Signed   By: Lucienne Capers M.D.   On: 02/03/2018 06:30     ASSESSMENT AND PLAN:   72 year old female with hx of COPD, HTN, hx of previous CVA, A.  Fib, Hypothyroidism, depression, hyperlipidemia, GERD who presented to the hospital due to shortness of breath  wheezing and respiratory distress noted to be in COPD exacerbation.  1.  Acute on chronic respiratory failure with hypoxia-secondary to COPD exacerbation. -Secondary to influenza A.  Patient still remains quite tight and bronchospastic. -Continue IV steroids, scheduled duo nebs, Pulmicort nebs, Tamiflu.    2.  COPD exacerbation- due to flu A.  -weaned off BiPAP.  Continue IV steroids, scheduled duo nebs, Pulmicort nebs. Slow to improve and remains bronchospastic presently.   3. Flu - pt. Is + for flu A - cont. Droplet precautions. Cont. Tamiflu.   4.  History of atrial fibrillation-rate controlled.  Continue Cardizem. -Continue Eliquis.    5.  Depression-continue Celexa.  6.  History of chronic diastolic CHF - cont. Bisoprolol, lasix. Clinically not in CHF.   7. DM type II -cont. SSI and follow BS  8. Hypothyroidism - cont. Synthroid.   9. Hyperlipidemia - cont. Pravachol.    All the records are reviewed and case discussed with Care Management/Social Worker. Management plans discussed with the patient, family and they are in agreement.  CODE STATUS: Full code  DVT Prophylaxis: Eliquis  TOTAL TIME TAKING CARE OF THIS PATIENT: 30 minutes.   POSSIBLE D/C IN 2-3 DAYS, DEPENDING ON CLINICAL CONDITION.   Henreitta Leber M.D on 02/04/2018 at 4:51 PM  Between 7am to 6pm - Pager - [phone]  After 6pm go to [email or web address] - [employer] [facility]  Office  [phone]  CC: Primary care physician; [place]

## 2018-02-05 DIAGNOSIS — J441 Chronic obstructive pulmonary disease with (acute) exacerbation: Principal | ICD-10-CM

## 2018-02-05 DIAGNOSIS — J9621 Acute and chronic respiratory failure with hypoxia: Secondary | ICD-10-CM

## 2018-02-05 LAB — BLOOD GAS, ARTERIAL
Acid-Base Excess: 5.8 mmol/L — ABNORMAL HIGH (ref 0.0–2.0)
Bicarbonate: 31.2 mmol/L — ABNORMAL HIGH (ref 20.0–28.0)
Delivery systems: POSITIVE
Expiratory PAP: 5
FIO2: 0.4
Inspiratory PAP: 10
O2 Saturation: 99.3 %
Patient temperature: 37
pCO2 arterial: 47 mmHg (ref 32.0–48.0)
pH, Arterial: 7.43 (ref 7.350–7.450)
pO2, Arterial: 148 mmHg — ABNORMAL HIGH (ref 83.0–108.0)

## 2018-02-05 LAB — BASIC METABOLIC PANEL
Anion gap: 7 (ref 5–15)
BUN: 37 mg/dL — ABNORMAL HIGH (ref 8–23)
CO2: 26 mmol/L (ref 22–32)
Calcium: 8.9 mg/dL (ref 8.9–10.3)
Chloride: 107 mmol/L (ref 98–111)
Creatinine, Ser: 0.86 mg/dL (ref 0.44–1.00)
GFR calc Af Amer: >60 mL/min (ref >60)
GFR calc non Af Amer: >60 mL/min (ref >60)
Glucose, Bld: 142 mg/dL — ABNORMAL HIGH (ref 70–99)
Potassium: 4.7 mmol/L (ref 3.5–5.1)
Sodium: 140 mmol/L (ref 135–145)

## 2018-02-05 LAB — GLUCOSE, CAPILLARY
Glucose-Capillary: 130 mg/dL — ABNORMAL HIGH (ref 70–99)
Glucose-Capillary: 137 mg/dL — ABNORMAL HIGH (ref 70–99)
Glucose-Capillary: 155 mg/dL — ABNORMAL HIGH (ref 70–99)
Glucose-Capillary: 168 mg/dL — ABNORMAL HIGH (ref 70–99)

## 2018-02-05 LAB — CBC
HCT: 45.4 % (ref 36.0–46.0)
Hemoglobin: 14.2 g/dL (ref 12.0–15.0)
MCH: 30.4 pg (ref 26.0–34.0)
MCHC: 31.3 g/dL (ref 30.0–36.0)
MCV: 97.2 fL (ref 80.0–100.0)
Platelets: 295 10*3/uL (ref 150–400)
RBC: 4.67 MIL/uL (ref 3.87–5.11)
RDW: 14.2 % (ref 11.5–15.5)
WBC: 18 10*3/uL — ABNORMAL HIGH (ref 4.0–10.5)
nRBC: 0 % (ref 0.0–0.2)

## 2018-02-05 LAB — MAGNESIUM: Magnesium: 2.4 mg/dL (ref 1.7–2.4)

## 2018-02-05 LAB — PHOSPHORUS: Phosphorus: 3.5 mg/dL (ref 2.5–4.6)

## 2018-02-05 MED ORDER — ZOLPIDEM TARTRATE 5 MG PO TABS
5.0000 mg | ORAL_TABLET | Freq: Every evening | ORAL | Status: DC | PRN
  Administered 2018-02-05 – 2018-02-07 (×3): 5 mg via ORAL
  Filled 2018-02-05 (×4): qty 1

## 2018-02-05 NOTE — Progress Notes (Signed)
CRITICAL CARE NOTE Referring Attending Sainani   CC  Follow up COPD exacerbation  SUBJECTIVE Wheezing On biPAP last night resp status is tenious Alert and awake Very anxious    SIGNIFICANT EVENTS 12/19 admitted to ICU for biPAP 12/20 on biPAP  BP (!) 129/93   Pulse 97   Temp 99.1 F (37.3 C) (Axillary)   Resp (!) 24   Ht 5\' 2"  (1.575 m)   Wt 89.4 kg   SpO2 94%   BMI 36.03 kg/m      Review of Systems:  Gen:  Denies  fever, sweats, chills weigh loss  HEENT: Denies blurred vision, double vision, ear pain, eye pain, hearing loss, nose bleeds, sore throat Cardiac:  No dizziness, chest pain or heaviness, chest tightness,edema, No JVD Resp:   +cough, -sputum production, +shortness of breath,+wheezing, -hemoptysis,  Gi: Denies swallowing difficulty, stomach pain, nausea or vomiting, diarrhea, constipation, bowel incontinence Gu:  Denies bladder incontinence, burning urine Ext:   Denies Joint pain, stiffness or swelling Skin: Denies  skin rash, easy bruising or bleeding or hives Endoc:  Denies polyuria, polydipsia , polyphagia or weight change Psych:   Denies depression, insomnia or hallucinations  Other:  All other systems negative   Physical Examination:   GENERAL:NAD, no fevers, chills, no weakness no fatigue HEAD: Normocephalic, atraumatic.  EYES: Pupils equal, round, reactive to light. Extraocular muscles intact. No scleral icterus.  MOUTH: Moist mucosal membrane. Dentition intact. No abscess noted.  EAR, NOSE, THROAT: Clear without exudates. No external lesions.  NECK: Supple. No thyromegaly. No nodules. No JVD.  PULMONARY: CTA B/L +wheezing CARDIOVASCULAR: S1 and S2. Regular rate and rhythm. No murmurs, rubs, or gallops. No edema. Pedal pulses 2+ bilaterally.  GASTROINTESTINAL: Soft, nontender, nondistended. No masses. Positive bowel sounds. No hepatosplenomegaly.  MUSCULOSKELETAL: No swelling, clubbing, or edema. Range of motion full in all extremities.   NEUROLOGIC: Cranial nerves II through XII are intact. No gross focal neurological deficits. Sensation intact. Reflexes intact.  SKIN: No ulceration, lesions, rashes, or cyanosis. Skin warm and dry. Turgor intact.  PSYCHIATRIC: Mood, affect within normal limits. The patient is awake, alert and oriented x 3. Insight, judgment intact.  ALL OTHER ROS ARE NEGATIVE            ASSESSMENT AND PLAN SYNOPSIS  SEVERE COPD EXACERBATION -continue IV steroids as prescribed -continue NEB THERAPY as prescribed -morphine as needed -wean fio2 as needed and tolerated Morphine as needed  Severe Hypoxic and Hypercapnic Respiratory Failure biPAP as needed   INFECTIOUS DISEASE +INFL A pneumonia Oxygen as needed   ELECTROLYTES -follow labs as needed -replace as needed -pharmacy consultation and following    DVT/GI PRX ordered TRANSFUSIONS AS NEEDED MONITOR FSBS ASSESS the need for LABS as needed  Remains SD status     Corrin Parker, M.D.  [facility]  Medical Director [place] Director [facility] Cardio-Pulmonary Department

## 2018-02-05 NOTE — Progress Notes (Signed)
Pharmacy Electrolyte Monitoring Consult:  Pharmacy consulted to assist in monitoring and replacing electrolytes in this 72 y.o. female admitted on 02/03/2018 with Shortness of Breath   Labs:  Sodium (mmol/L)  Date Value  02/05/2018 140  01/14/2014 138   Potassium (mmol/L)  Date Value  02/05/2018 4.7  01/14/2014 5.0   Magnesium (mg/dL)  Date Value  02/05/2018 2.4  09/22/2013 1.8   Phosphorus (mg/dL)  Date Value  02/05/2018 3.5   Calcium (mg/dL)  Date Value  02/05/2018 8.9   Calcium, Total (mg/dL)  Date Value  01/14/2014 8.8   Albumin (g/dL)  Date Value  02/03/2018 3.9  01/10/2014 3.1 (L)    Assessment/Plan: K 4.7 Mag 2.4  Phos 3.5  Scr 0.86 Patient ordered furosemide 20mg  IV BID. Pharmacy will continue to monitor and adjust per consult.   Jomarion Mish A 02/05/2018 9:27 AM

## 2018-02-05 NOTE — Progress Notes (Signed)
[place] at [place] NAME: Summer Bradley    MR#:  [id number]  DATE OF BIRTH:  [birth year]  SUBJECTIVE:   Still having some wheezing/shortness of breath and cough.  Patient is off BiPAP now on [place]  Noted to be positive for influenza A   REVIEW OF SYSTEMS:    Review of Systems  Constitutional: Negative for chills and fever.  HENT: Negative for congestion and tinnitus.   Eyes: Negative for blurred vision and double vision.  Respiratory: Positive for cough, shortness of breath and wheezing.   Cardiovascular: Negative for chest pain, orthopnea and PND.  Gastrointestinal: Negative for abdominal pain, diarrhea, nausea and vomiting.  Genitourinary: Negative for dysuria and hematuria.  Neurological: Negative for dizziness, sensory change and focal weakness.  All other systems reviewed and are negative.   Nutrition: Heart healthy/Carb control.  Tolerating Diet: Yes Tolerating PT: Await Eval.   DRUG ALLERGIES:   Allergies  Allergen Reactions  . Lisinopril Cough    VITALS:  Blood pressure 130/82, pulse (!) 102, temperature 98.7 F (37.1 C), temperature source Oral, resp. rate (!) 29, height 5\' 2"  (1.575 m), weight 89.4 kg, SpO2 94 %.  PHYSICAL EXAMINATION:   Physical Exam  GENERAL:  72 y.o.-year-old patient sitting up in bed in no acute distress.  EYES: Pupils equal, round, reactive to light and accommodation. No scleral icterus. Extraocular muscles intact.  HEENT: Head atraumatic, normocephalic. Oropharynx and nasopharynx clear.  NECK:  Supple, no jugular venous distention. No thyroid enlargement, no tenderness.  LUNGS: Poor A/e B/l.  Diffused exp. Wheezing b/l, No rales, rhonchi. No use of accessory muscles of respiration.  CARDIOVASCULAR: S1, S2 normal. No murmurs, rubs, or gallops.  ABDOMEN: Soft, nontender, nondistended. Bowel sounds present. No organomegaly or mass.  EXTREMITIES: No cyanosis, clubbing or edema b/l.      NEUROLOGIC: Cranial nerves II through XII are intact. No focal Motor or sensory deficits b/l.   PSYCHIATRIC: The patient is alert and oriented x 3.  SKIN: No obvious rash, lesion, or ulcer.    LABORATORY PANEL:   CBC Recent Labs  Lab 02/05/18 0537  WBC 18.0*  HGB 14.2  HCT 45.4  PLT 295   ------------------------------------------------------------------------------------------------------------------  Chemistries  Recent Labs  Lab 02/03/18 0611  02/05/18 0537  NA 139   < > 140  K 3.7   < > 4.7  CL 103   < > 107  CO2 28   < > 26  GLUCOSE 120*   < > 142*  BUN 20   < > 37*  CREATININE 1.00   < > 0.86  CALCIUM 9.1   < > 8.9  MG 1.8   < > 2.4  AST 27  --   --   ALT 15  --   --   ALKPHOS 53  --   --   BILITOT 1.1  --   --    < > = values in this interval not displayed.   ------------------------------------------------------------------------------------------------------------------  Cardiac Enzymes Recent Labs  Lab 02/03/18 0611  TROPONINI <0.03   ------------------------------------------------------------------------------------------------------------------  RADIOLOGY:  No results found.   ASSESSMENT AND PLAN:   72 year old female with hx of COPD, HTN, hx of previous CVA, A. Fib, Hypothyroidism, depression, hyperlipidemia, GERD who presented to the hospital due to shortness of breath wheezing and respiratory distress noted to be in COPD exacerbation.  1.  Acute on chronic respiratory failure with hypoxia-secondary to COPD exacerbation. -Secondary to  influenza A.   -Continue IV steroids, scheduled duo nebs, Pulmicort nebs, Tamiflu.    2.  COPD exacerbation- due to flu A.  -weaned off BiPAP.  Continue IV steroids, scheduled duo nebs, Pulmicort nebs. Slow to improve and remains bronchospastic presently.   3. Flu - pt. Is + for flu A - cont. Droplet precautions. Cont. Tamiflu.   4.  History of atrial fibrillation-rate controlled.  Continue  Cardizem. -Continue Eliquis.    5.  Depression-continue Celexa.  6.  History of chronic diastolic CHF - cont. Bisoprolol, lasix. Clinically not in CHF.   7. DM type II -cont. SSI and follow BS  8. Hypothyroidism - cont. Synthroid.   9. Hyperlipidemia - cont. Pravachol.    All the records are reviewed and case discussed with Care Management/Social Worker. Management plans discussed with the patient, family and they are in agreement.  CODE STATUS: Full code  DVT Prophylaxis: Eliquis  TOTAL TIME TAKING CARE OF THIS PATIENT: 30 minutes.   POSSIBLE D/C IN 2-3 DAYS, DEPENDING ON CLINICAL CONDITION.   Fritzi Mandes M.D on 02/05/2018 at 3:07 PM  Between 7am to 6pm - Pager - [phone]  After 6pm go to [email or web address] - [employer] [facility]  Office  [phone]  CC: Primary care physician; [place]

## 2018-02-05 NOTE — Progress Notes (Signed)
2315 - Pt agitated.  Staff members holding her hand to try to reassure her.  She is confused " I need to get out of the bed and go to the car."  Pt reminded in the hospital.  Unable to calm pt - removed BiPAP.   Notified Patria Mane, NP.  Orders received.

## 2018-02-06 ENCOUNTER — Inpatient Hospital Stay: Payer: Medicare (Managed Care)

## 2018-02-06 LAB — BASIC METABOLIC PANEL
Anion gap: 8 (ref 5–15)
BUN: 40 mg/dL — ABNORMAL HIGH (ref 8–23)
CO2: 29 mmol/L (ref 22–32)
Calcium: 9.4 mg/dL (ref 8.9–10.3)
Chloride: 105 mmol/L (ref 98–111)
Creatinine, Ser: 1.02 mg/dL — ABNORMAL HIGH (ref 0.44–1.00)
GFR calc Af Amer: >60 mL/min (ref >60)
GFR calc non Af Amer: 55 mL/min — ABNORMAL LOW (ref >60)
Glucose, Bld: 155 mg/dL — ABNORMAL HIGH (ref 70–99)
Potassium: 4.4 mmol/L (ref 3.5–5.1)
Sodium: 142 mmol/L (ref 135–145)

## 2018-02-06 LAB — GLUCOSE, CAPILLARY
Glucose-Capillary: 123 mg/dL — ABNORMAL HIGH (ref 70–99)
Glucose-Capillary: 124 mg/dL — ABNORMAL HIGH (ref 70–99)
Glucose-Capillary: 151 mg/dL — ABNORMAL HIGH (ref 70–99)
Glucose-Capillary: 158 mg/dL — ABNORMAL HIGH (ref 70–99)

## 2018-02-06 MED ORDER — PHENOL 1.4 % MT LIQD
1.0000 | OROMUCOSAL | Status: DC | PRN
  Administered 2018-02-06 – 2018-02-10 (×3): 1 via OROMUCOSAL
  Filled 2018-02-06: qty 177

## 2018-02-06 MED ORDER — FUROSEMIDE 10 MG/ML IJ SOLN
40.0000 mg | Freq: Once | INTRAMUSCULAR | Status: DC
  Filled 2018-02-06: qty 4

## 2018-02-06 MED ORDER — OSELTAMIVIR PHOSPHATE 30 MG PO CAPS
30.0000 mg | ORAL_CAPSULE | Freq: Two times a day (BID) | ORAL | Status: AC
  Administered 2018-02-06 – 2018-02-08 (×6): 30 mg via ORAL
  Filled 2018-02-06 (×6): qty 1

## 2018-02-06 MED ORDER — FUROSEMIDE 10 MG/ML IJ SOLN
INTRAMUSCULAR | Status: AC
  Administered 2018-02-06: 40 mg
  Filled 2018-02-06: qty 4

## 2018-02-06 MED ORDER — GUAIFENESIN-CODEINE 100-10 MG/5ML PO SOLN
5.0000 mL | ORAL | Status: DC | PRN
  Administered 2018-02-06 – 2018-02-09 (×3): 5 mL via ORAL
  Filled 2018-02-06 (×4): qty 5

## 2018-02-06 NOTE — Progress Notes (Signed)
[facility] NAME: Summer Bradley    MR#:  [id number]  DATE OF BIRTH:  February 02, 1946  SUBJECTIVE:   Went into respiratory distress. Found to have interstitial lung edema. Was on BiPAP this morning. Received Lasix. Good urine output of 1300 mL. Now on nasal cannula oxygen. Noted to be positive for influenza A  intermittent confusion per daughter REVIEW OF SYSTEMS:    Review of Systems  Constitutional: Negative for chills and fever.  HENT: Negative for congestion and tinnitus.   Eyes: Negative for blurred vision and double vision.  Respiratory: Positive for cough, shortness of breath and wheezing.   Cardiovascular: Negative for chest pain, orthopnea and PND.  Gastrointestinal: Negative for abdominal pain, diarrhea, nausea and vomiting.  Genitourinary: Negative for dysuria and hematuria.  Neurological: Negative for dizziness, sensory change and focal weakness.  All other systems reviewed and are negative.   Nutrition: Heart healthy/Carb control.  Tolerating Diet: Yes Tolerating PT: Await Eval.   DRUG ALLERGIES:   Allergies  Allergen Reactions  . Lisinopril Cough    VITALS:  Blood pressure (!) 154/110, pulse (!) 108, temperature 97.7 F (36.5 C), temperature source Axillary, resp. rate (!) 27, height 5\' 2"  (1.575 m), weight 89.4 kg, SpO2 98 %.  PHYSICAL EXAMINATION:   Physical Exam  GENERAL:  72 y.o.-year-old patient sitting up in bed in no acute distress.  EYES: Pupils equal, round, reactive to light and accommodation. No scleral icterus. Extraocular muscles intact.  HEENT: Head atraumatic, normocephalic. Oropharynx and nasopharynx clear.  NECK:  Supple, no jugular venous distention. No thyroid enlargement, no tenderness.  LUNGS: Poor A/e B/l.  Diffused exp. Wheezing b/l, No rales, rhonchi. No use of accessory muscles of respiration.  CARDIOVASCULAR: S1, S2 normal. No murmurs, rubs, or gallops.  ABDOMEN: Soft, nontender,  nondistended. Bowel sounds present. No organomegaly or mass.  EXTREMITIES: No cyanosis, clubbing or edema b/l.    NEUROLOGIC: Cranial nerves II through XII are intact. No focal Motor or sensory deficits b/l.   PSYCHIATRIC: The patient is alert and oriented x 3.  SKIN: No obvious rash, lesion, or ulcer.    LABORATORY PANEL:   CBC Recent Labs  Lab 02/05/18 0537  WBC 18.0*  HGB 14.2  HCT 45.4  PLT 295   ------------------------------------------------------------------------------------------------------------------  Chemistries  Recent Labs  Lab 02/03/18 0611  02/05/18 0537 02/06/18 0413  NA 139   < > 140 142  K 3.7   < > 4.7 4.4  CL 103   < > 107 105  CO2 28   < > 26 29  GLUCOSE 120*   < > 142* 155*  BUN 20   < > 37* 40*  CREATININE 1.00   < > 0.86 1.02*  CALCIUM 9.1   < > 8.9 9.4  MG 1.8   < > 2.4  --   AST 27  --   --   --   ALT 15  --   --   --   ALKPHOS 53  --   --   --   BILITOT 1.1  --   --   --    < > = values in this interval not displayed.   ------------------------------------------------------------------------------------------------------------------  Cardiac Enzymes Recent Labs  Lab 02/03/18 0611  TROPONINI <0.03   ------------------------------------------------------------------------------------------------------------------  RADIOLOGY:  Dg Chest Port 1 View  Result Date: 02/06/2018 CLINICAL DATA:  Patient poor historian at this time. Encounter for acute respiratory failure.  EXAM: PORTABLE CHEST 1 VIEW COMPARISON:  02/03/2018 FINDINGS: Normal cardiac silhouette. Improvement in interstitial edema pattern seen on prior. No focal consolidation. No pleural fluid or pneumothorax. IMPRESSION: Improved interstitial edema pattern. Electronically Signed   By: Suzy Bouchard M.D.   On: 02/06/2018 08:30     ASSESSMENT AND PLAN:   72 year old female with hx of COPD, HTN, hx of previous CVA, A. Fib, Hypothyroidism, depression, hyperlipidemia, GERD who  presented to the hospital due to shortness of breath wheezing and respiratory distress noted to be in COPD exacerbation.  1.  Acute on chronic respiratory failure with hypoxia-secondary to COPD exacerbation//pulmonary edema -Secondary to influenza A.   -Continue IV steroids, scheduled duo nebs, Pulmicort nebs, Tamiflu.   -Received a dose of IV Lasix. Urine output 1300 mL. -Shows EF of 55 to 60%.  2.  COPD exacerbation- due to flu A.  -weaned off BiPAP.  Continue IV steroids, scheduled duo nebs, Pulmicort nebs. Slow to improve and remains bronchospastic presently.   3. Flu - pt. Is + for flu A - cont. Droplet precautions. Cont. Tamiflu.   4.  History of atrial fibrillation-rate controlled.  Continue Cardizem. -Continue Eliquis.    5.  Depression-continue Celexa.  6.  History of chronic diastolic CHF - cont. Bisoprolol, lasix. Clinically not in CHF.   7. DM type II -cont. SSI and follow BS  8. Hypothyroidism - cont. Synthroid.   9. Hyperlipidemia - cont. Pravachol.    All the records are reviewed and case discussed with Care Management/Social Worker. Management plans discussed with the patient, family and they are in agreement.  CODE STATUS: Full code  DVT Prophylaxis: Eliquis  TOTAL TIME TAKING CARE OF THIS PATIENT: 30 minutes.   POSSIBLE D/C IN 2-3 DAYS, DEPENDING ON CLINICAL CONDITION.   Fritzi Mandes M.D on 02/06/2018 at 1:40 PM  Between 7am to 6pm - Pager - [phone]  After 6pm go to [email or web address] - [facility]  Office  [phone]  CC: Primary care physician; [place]

## 2018-02-06 NOTE — Progress Notes (Signed)
Pt slept continuously for 4 hours ([id number]). Pt on Bi-PAP while sleeping.  She stated that she "felt better" when she woke up.

## 2018-02-06 NOTE — Progress Notes (Signed)
CRITICAL CARE NOTE Referring Attending Sainani   CC  Follow up severe COPD exacerbation   SUBJECTIVE Increased WOB Increased SOB +wheezing CXR c/w Edema Will give lasix Place back on biPAP anxious   SIGNIFICANT EVENTS 12/19 admitted to ICU for biPAP 12/20 on biPAP 12/22 severe wheezing, resp failure, on biPAP  BP (!) 152/106   Pulse (!) 117   Temp 98.1 F (36.7 C) (Axillary)   Resp (!) 21   Ht 5\' 2"  (1.575 m)   Wt 89.4 kg   SpO2 94%   BMI 36.03 kg/m     REVIEW OF SYSTEMS  PATIENT IS UNABLE TO PROVIDE COMPLETE REVIEW OF SYSTEM S DUE TO SEVERE CRITICAL ILLNESS and increased WOB    PHYSICAL EXAMINATION:  GENERAL:critically ill appearing, +resp distress HEAD: Normocephalic, atraumatic.  EYES: Pupils equal, round, reactive to light.  No scleral icterus.  MOUTH: Moist mucosal membrane. NECK: Supple. No thyromegaly. No nodules. No JVD.  PULMONARY: +rhonchi, +wheezing CARDIOVASCULAR: S1 and S2. Regular rate and rhythm. No murmurs, rubs, or gallops.  GASTROINTESTINAL: Soft, nontender, -distended. No masses. Positive bowel sounds. No hepatosplenomegaly.  MUSCULOSKELETAL: No swelling, clubbing, or edema.  NEUROLOGIC: alert and awake SKIN:intact,warm,dry    ASSESSMENT AND PLAN SYNOPSIS   SEVERE COPD EXACERBATION-increased WOB -continue IV steroids as prescribed -continue NEB THERAPY as prescribed -morphine as needed -wean fio2 as needed and tolerated Place on biPAP Morphine as  needed   Severe Hypoxic and Hypercapnic Respiratory Failure -place on biPAP -continue Bronchodilator Therapy  INFECTIOUS DISEASE + INFL A pneumonia   ELECTROLYTES -follow labs as needed -replace as needed -pharmacy consultation and following   DVT/GI PRX ordered TRANSFUSIONS AS NEEDED MONITOR FSBS ASSESS the need for LABS as needed   KEEP NPO for now    Critical Care Time devoted to patient care services described in this note is  35 minutes.   Overall,  patient is critically ill, prognosis is guarded.    Corrin Parker, M.D.  [facility]  Medical Director [place] Director [facility] Cardio-Pulmonary Department

## 2018-02-06 NOTE — Progress Notes (Signed)
Pharmacy Electrolyte Monitoring Consult:  Pharmacy consulted to assist in monitoring and replacing electrolytes in this 72 y.o. female admitted on 02/03/2018 with Shortness of Breath   Labs:  Sodium (mmol/L)  Date Value  02/06/2018 142  01/14/2014 138   Potassium (mmol/L)  Date Value  02/06/2018 4.4  01/14/2014 5.0   Magnesium (mg/dL)  Date Value  02/05/2018 2.4  09/22/2013 1.8   Phosphorus (mg/dL)  Date Value  02/05/2018 3.5   Calcium (mg/dL)  Date Value  02/06/2018 9.4   Calcium, Total (mg/dL)  Date Value  01/14/2014 8.8   Albumin (g/dL)  Date Value  02/03/2018 3.9  01/10/2014 3.1 (L)    Assessment/Plan: K 4.4  Scr 1.02 No additional supplementation at this time. Patient ordered furosemide 20mg  IV BID. Pharmacy will continue to monitor and adjust per consult.   Jermesha Sottile A 02/06/2018 9:55 AM
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
## 2018-02-06 NOTE — Progress Notes (Signed)
PHARMACY NOTE:  ANTIMICROBIAL RENAL DOSAGE ADJUSTMENT  Current antimicrobial regimen includes a mismatch between antimicrobial dosage and estimated renal function.  As per policy approved by the Pharmacy & Therapeutics and Medical Executive Committees, the antimicrobial dosage will be adjusted accordingly.  Current antimicrobial dosage:  Tamiflu 75mg  BID  Indication: influenza treatment  Renal Function:  Estimated Creatinine Clearance: 51 mL/min (A) (by C-G formula based on SCr of 1.02 mg/dL (H)). []      On intermittent HD, scheduled: []      On CRRT    Antimicrobial dosage has been changed to:  Tamiflu 30mg  BID  Thank you for allowing pharmacy to be a part of this patient's care.  Dallie Piles, PharmD 02/06/2018 8:25 AM

## 2018-02-07 DIAGNOSIS — J101 Influenza due to other identified influenza virus with other respiratory manifestations: Secondary | ICD-10-CM

## 2018-02-07 LAB — GLUCOSE, CAPILLARY
Glucose-Capillary: 104 mg/dL — ABNORMAL HIGH (ref 70–99)
Glucose-Capillary: 157 mg/dL — ABNORMAL HIGH (ref 70–99)
Glucose-Capillary: 140 mg/dL — ABNORMAL HIGH (ref 70–99)
Glucose-Capillary: 136 mg/dL — ABNORMAL HIGH (ref 70–99)
Glucose-Capillary: 152 mg/dL — ABNORMAL HIGH (ref 70–99)
Glucose-Capillary: 123 mg/dL — ABNORMAL HIGH (ref 70–99)

## 2018-02-07 LAB — BASIC METABOLIC PANEL
Anion gap: 11 (ref 5–15)
BUN: 42 mg/dL — ABNORMAL HIGH (ref 8–23)
CO2: 30 mmol/L (ref 22–32)
Calcium: 9.6 mg/dL (ref 8.9–10.3)
Chloride: 99 mmol/L (ref 98–111)
Creatinine, Ser: 1.16 mg/dL — ABNORMAL HIGH (ref 0.44–1.00)
GFR calc Af Amer: 54 mL/min — ABNORMAL LOW (ref >60)
GFR calc non Af Amer: 47 mL/min — ABNORMAL LOW (ref >60)
Glucose, Bld: 164 mg/dL — ABNORMAL HIGH (ref 70–99)
Potassium: 3.9 mmol/L (ref 3.5–5.1)
Sodium: 140 mmol/L (ref 135–145)

## 2018-02-07 LAB — BLOOD GAS, VENOUS
Acid-Base Excess: 4.3 mmol/L — ABNORMAL HIGH (ref 0.0–2.0)
Bicarbonate: 31.5 mmol/L — ABNORMAL HIGH (ref 20.0–28.0)
Delivery systems: POSITIVE
FIO2: 0.35
O2 Saturation: 55.7 %
Patient temperature: 37
pCO2, Ven: 57 mmHg (ref 44.0–60.0)
pH, Ven: 7.35 (ref 7.250–7.430)

## 2018-02-07 LAB — CBC
HCT: 49.8 % — ABNORMAL HIGH (ref 36.0–46.0)
Hemoglobin: 15.9 g/dL — ABNORMAL HIGH (ref 12.0–15.0)
MCH: 30.9 pg (ref 26.0–34.0)
MCHC: 31.9 g/dL (ref 30.0–36.0)
MCV: 96.9 fL (ref 80.0–100.0)
Platelets: 317 10*3/uL (ref 150–400)
RBC: 5.14 MIL/uL — ABNORMAL HIGH (ref 3.87–5.11)
RDW: 13.8 % (ref 11.5–15.5)
WBC: 13.6 10*3/uL — ABNORMAL HIGH (ref 4.0–10.5)
nRBC: 0 % (ref 0.0–0.2)

## 2018-02-07 LAB — PHOSPHORUS: Phosphorus: 4.4 mg/dL (ref 2.5–4.6)

## 2018-02-07 LAB — MAGNESIUM: Magnesium: 2.2 mg/dL (ref 1.7–2.4)

## 2018-02-07 MED ORDER — IPRATROPIUM-ALBUTEROL 0.5-2.5 (3) MG/3ML IN SOLN
3.0000 mL | Freq: Four times a day (QID) | RESPIRATORY_TRACT | Status: DC
  Administered 2018-02-07 – 2018-02-10 (×12): 3 mL via RESPIRATORY_TRACT
  Filled 2018-02-07 (×12): qty 3

## 2018-02-07 MED ORDER — SENNOSIDES-DOCUSATE SODIUM 8.6-50 MG PO TABS
1.0000 | ORAL_TABLET | Freq: Two times a day (BID) | ORAL | Status: DC
  Administered 2018-02-07 – 2018-02-10 (×7): 1 via ORAL
  Filled 2018-02-07 (×6): qty 1

## 2018-02-07 NOTE — Progress Notes (Signed)
Presently reasonably comfortable on [place] O2 but required BiPAP earlier today Problems with anxiety and hallucinations last night No new complaints  Vitals:   02/07/18 1153 02/07/18 1200 02/07/18 1300 02/07/18 1400  BP:  (!) 146/100 115/88 (!) 122/99  Pulse:  (!) 103 99 97  Resp:  20 (!) 23 (!) 25  Temp:      TempSrc:      SpO2: 94% 96% 93% 94%  Weight:      Height:       Gen: Mild increased work of breathing, cognition intact HEENT: NCAT, sclera white Neck: No JVD noted Lungs: Diminished breath sounds, few scattered wheezes Cardiovascular: IRIR, rate adequately controlled, no murmurs Abdomen: Soft, nontender, normal BS Ext: without clubbing, cyanosis, edema Neuro: grossly intact Skin: Limited exam, no lesions noted  BMP Latest Ref Rng & Units 02/07/2018 02/06/2018 02/05/2018  Glucose 70 - 99 mg/dL 164(H) 155(H) 142(H)  BUN 8 - 23 mg/dL 42(H) 40(H) 37(H)  Creatinine 0.44 - 1.00 mg/dL 1.16(H) 1.02(H) 0.86  Sodium 135 - 145 mmol/L 140 142 140  Potassium 3.5 - 5.1 mmol/L 3.9 4.4 4.7  Chloride 98 - 111 mmol/L 99 105 107  CO2 22 - 32 mmol/L 30 29 26   Calcium 8.9 - 10.3 mg/dL 9.6 9.4 8.9   CBC Latest Ref Rng & Units 02/07/2018 02/05/2018 02/03/2018  WBC 4.0 - 10.5 K/uL 13.6(H) 18.0(H) 12.5(H)  Hemoglobin 12.0 - 15.0 g/dL 15.9(H) 14.2 15.3(H)  Hematocrit 36.0 - 46.0 % 49.8(H) 45.4 49.1(H)  Platelets 150 - 400 K/uL 317 295 287    CXR 12/22: Borderline cardiomegaly.  Generalized interstitial prominence which appears chronic.  No acute findings.  IMPRESSION: Acute/chronic hypoxemic respiratory failure Severe baseline COPD with acute exacerbation Influenza A positive Chronic atrial fibrillation Followed by [facility] as [facility]  PLAN/REC: Continue BiPAP as needed Mandatory nocturnal BiPAP (which she wears at home) Continue supplemental oxygen to maintain SPO2 >90% Continue nebulized steroids and bronchodilators Minimize dose of systemic steroids due to neurocognitive  effects Complete course of oseltamivir Continue apixaban, diltiazem If she tolerates off of BiPAP for rest of today, likely transfer to MedSurg floor in AM 12/24   Merton Border, MD PCCM service Mobile [phone] Pager [phone] 02/07/2018 3:38 PM

## 2018-02-07 NOTE — Progress Notes (Signed)
Pt was not tolerating Bipap at this time, requested to be placed back on [place]

## 2018-02-07 NOTE — Progress Notes (Signed)
[facility] Encounter Note  Patient: Summer Bradley / Admit Date: 02/03/2018 / Date of Encounter: 02/07/2018, 1:25 PM   Subjective: Patient is having continued significant cough and congestion.  No evidence of heart failure or angina.  Telemetry shows atrial fibrillation with variable rate likely tach secondary to above and continued recovery from respiratory failure  Review of Systems: Positive for: Cough congestion Negative for: Vision change, hearing change, syncope, dizziness, nausea, vomiting,diarrhea, bloody stool, stomach pain, positive for cough, congestion, negative for diaphoresis, urinary frequency, urinary pain,skin lesions, skin rashes Others previously listed  Objective: Telemetry: Atrial fibrillation with rapid ventricular rate Physical Exam: Blood pressure (!) 143/109, pulse 97, temperature (!) 97.4 F (36.3 C), temperature source Axillary, resp. rate (!) 22, height 5\' 2"  (1.575 m), weight 89.4 kg, SpO2 93 %. Body mass index is 36.03 kg/m. General: Well developed, well nourished, in no acute distress. Head: Normocephalic, atraumatic, sclera non-icteric, no xanthomas, nares are without discharge. Neck: No apparent masses Lungs: Normal respirations with some wheezes, diffuse rhonchi, no rales , few crackles   Heart: Regular rate and rhythm, normal S1 S2, no murmur, no rub, no gallop, PMI is normal size and placement, carotid upstroke normal without bruit, jugular venous pressure normal Abdomen: Soft, non-tender, non-distended with normoactive bowel sounds. No hepatosplenomegaly. Abdominal aorta is normal size without bruit Extremities: Trace to 1+ edema, no clubbing, no cyanosis, no ulcers,  Peripheral: 2+ radial, 2+ femoral, 2+ dorsal pedal pulses Neuro: Alert and oriented. Moves all extremities spontaneously. Psych:  Responds to questions appropriately with a normal affect.   Intake/Output Summary (Last 24 hours) at 02/07/2018 1325 Last data  filed at 02/07/2018 1319 Gross per 24 hour  Intake -  Output 1945 ml  Net -1945 ml    Inpatient Medications:  . apixaban  5 mg Oral BID  . bisoprolol  2.5 mg Oral BID  . budesonide (PULMICORT) nebulizer solution  0.5 mg Nebulization BID  . calcium-vitamin D  1 tablet Oral BID  . citalopram  40 mg Oral QHS  . diltiazem  240 mg Oral Daily  . insulin aspart  0-5 Units Subcutaneous QHS  . insulin aspart  0-9 Units Subcutaneous TID WC  . ipratropium-albuterol  3 mL Nebulization Q4H  . levothyroxine  150 mcg Oral QAC breakfast  . methylPREDNISolone (SOLU-MEDROL) injection  20 mg Intravenous BID  . oseltamivir  30 mg Oral BID  . pravastatin  10 mg Oral q1800  . QUEtiapine  50 mg Oral QHS  . senna-docusate  1 tablet Oral BID   Infusions:   Labs: Recent Labs    02/05/18 0537 02/06/18 0413 02/07/18 0416  NA 140 142 140  K 4.7 4.4 3.9  CL 107 105 99  CO2 26 29 30   GLUCOSE 142* 155* 164*  BUN 37* 40* 42*  CREATININE 0.86 1.02* 1.16*  CALCIUM 8.9 9.4 9.6  MG 2.4  --  2.2  PHOS 3.5  --  4.4   No results for input(s): AST, ALT, ALKPHOS, BILITOT, PROT, ALBUMIN in the last 72 hours. Recent Labs    02/05/18 0537 02/07/18 0416  WBC 18.0* 13.6*  HGB 14.2 15.9*  HCT 45.4 49.8*  MCV 97.2 96.9  PLT 295 317   No results for input(s): CKTOTAL, CKMB, TROPONINI in the last 72 hours. Invalid input(s): POCBNP No results for input(s): HGBA1C in the last 72 hours.   Weights: Filed Weights   02/03/18 0553  Weight: 89.4 kg     Radiology/Studies:  Dg Chest Port 1 View  Result Date: 02/06/2018 CLINICAL DATA:  Patient poor historian at this time. Encounter for acute respiratory failure. EXAM: PORTABLE CHEST 1 VIEW COMPARISON:  02/03/2018 FINDINGS: Normal cardiac silhouette. Improvement in interstitial edema pattern seen on prior. No focal consolidation. No pleural fluid or pneumothorax. IMPRESSION: Improved interstitial edema pattern. Electronically Signed   By: Suzy Bouchard M.D.    On: 02/06/2018 08:30   Dg Chest Port 1 View  Result Date: 02/03/2018 CLINICAL DATA:  Shortness of breath. Wheezing. EXAM: PORTABLE CHEST 1 VIEW COMPARISON:  12/23/2015 FINDINGS: Cardiac enlargement without vascular congestion. Emphysematous changes in the upper lungs. Linear fibrosis or atelectasis in the lung bases is similar to previous study. No developing consolidation. No blunting of costophrenic angles. No pneumothorax. Mediastinal contours appear intact. Calcification of the aorta. Degenerative changes in the spine and shoulders. IMPRESSION: Emphysematous changes and fibrosis in the lungs. No evidence of active pulmonary disease. Cardiac enlargement. Electronically Signed   By: Lucienne Capers M.D.   On: 02/03/2018 06:30     Assessment and Recommendation  72 y.o. female with acute respiratory failure from chronic obstructive pulmonary disease and the flu and with hyperlipidemia hypertension and atrial fibrillation with rapid ventricular rate exacerbated by above and no current evidence of myocardial infarction or congestive heart failure 1.  Continue oral medication management for heart rate control of atrial fibrillation including diltiazem and bisoprolol 2.  Continue anticoagulation for further risk reduction in stroke with atrial fibrillation 3.  Continue supportive care of flu cough congestion and pneumonia 4.  No further cardiac diagnostics necessary at this time 5.  Okay for ambulation and follow for need of adjustments of medication management as ambulating  Signed, Serafina Royals M.D. FACC

## 2018-02-07 NOTE — Progress Notes (Signed)
Pharmacy Electrolyte Monitoring Consult:  Pharmacy consulted to assist in monitoring and replacing electrolytes in this 72 y.o. female admitted on 02/03/2018 with Shortness of Breath   Labs:  Sodium (mmol/L)  Date Value  02/07/2018 140  01/14/2014 138   Potassium (mmol/L)  Date Value  02/07/2018 3.9  01/14/2014 5.0   Magnesium (mg/dL)  Date Value  02/07/2018 2.2  09/22/2013 1.8   Phosphorus (mg/dL)  Date Value  02/07/2018 4.4   Calcium (mg/dL)  Date Value  02/07/2018 9.6   Calcium, Total (mg/dL)  Date Value  01/14/2014 8.8   Albumin (g/dL)  Date Value  02/03/2018 3.9  01/10/2014 3.1 (L)    Assessment/Plan: Furosemide discontinued. No replacement warranted at this time. Will obtain follow up BMP with am labs on 12/25.   Pharmacy will continue to monitor and adjust per consult.   Simpson,Michael L 02/07/2018 4:55 PM

## 2018-02-07 NOTE — Progress Notes (Signed)
Admitted from the ICU. Alert and Oriented.  Severely tachypnic, can only speak in short sentences.  Lungs sound diminished with mild exp wheeze. On 4L [place] with )2 sats of 93

## 2018-02-07 NOTE — Progress Notes (Signed)
[facility] [place] NAME: Summer Bradley    MR#:  [id number]  DATE OF BIRTH:  07-27-1945  SUBJECTIVE:    Now on nasal cannula oxygen. Noted to be positive for influenza A  intermittent confusion per daughter REVIEW OF SYSTEMS:    Review of Systems  Constitutional: Negative for chills and fever.  HENT: Negative for congestion and tinnitus.   Eyes: Negative for blurred vision and double vision.  Respiratory: Positive for cough, shortness of breath and wheezing.   Cardiovascular: Negative for chest pain, orthopnea and PND.  Gastrointestinal: Negative for abdominal pain, diarrhea, nausea and vomiting.  Genitourinary: Negative for dysuria and hematuria.  Neurological: Negative for dizziness, sensory change and focal weakness.  All other systems reviewed and are negative.   Nutrition: Heart healthy/Carb control.  Tolerating Diet: Yes Tolerating PT: Await Eval.   DRUG ALLERGIES:   Allergies  Allergen Reactions  . Lisinopril Cough    VITALS:  Blood pressure (!) 143/109, pulse 97, temperature (!) 97.4 F (36.3 C), temperature source Axillary, resp. rate (!) 22, height 5\' 2"  (1.575 m), weight 89.4 kg, SpO2 93 %.  PHYSICAL EXAMINATION:   Physical Exam  GENERAL:  72 y.o.-year-old patient sitting up in bed in no acute distress.  EYES: Pupils equal, round, reactive to light and accommodation. No scleral icterus. Extraocular muscles intact.  HEENT: Head atraumatic, normocephalic. Oropharynx and nasopharynx clear. [place]  NECK:  Supple, no jugular venous distention. No thyroid enlargement, no tenderness.  LUNGS: Poor A/e B/l.  Diffused exp. Wheezing b/l, No rales, rhonchi. No use of accessory muscles of respiration.  CARDIOVASCULAR: S1, S2 normal. No murmurs, rubs, or gallops.  ABDOMEN: Soft, nontender, nondistended. Bowel sounds present. No organomegaly or mass.  EXTREMITIES: No cyanosis, clubbing or edema b/l.    NEUROLOGIC: Cranial  nerves II through XII are intact. No focal Motor or sensory deficits b/l.   PSYCHIATRIC: The patient is alert and oriented x 3.  SKIN: No obvious rash, lesion, or ulcer.    LABORATORY PANEL:   CBC Recent Labs  Lab 02/07/18 0416  WBC 13.6*  HGB 15.9*  HCT 49.8*  PLT 317   ------------------------------------------------------------------------------------------------------------------  Chemistries  Recent Labs  Lab 02/03/18 0611  02/07/18 0416  NA 139   < > 140  K 3.7   < > 3.9  CL 103   < > 99  CO2 28   < > 30  GLUCOSE 120*   < > 164*  BUN 20   < > 42*  CREATININE 1.00   < > 1.16*  CALCIUM 9.1   < > 9.6  MG 1.8   < > 2.2  AST 27  --   --   ALT 15  --   --   ALKPHOS 53  --   --   BILITOT 1.1  --   --    < > = values in this interval not displayed.   ------------------------------------------------------------------------------------------------------------------  Cardiac Enzymes Recent Labs  Lab 02/03/18 0611  TROPONINI <0.03   ------------------------------------------------------------------------------------------------------------------  RADIOLOGY:  Dg Chest Port 1 View  Result Date: 02/06/2018 CLINICAL DATA:  Patient poor historian at this time. Encounter for acute respiratory failure. EXAM: PORTABLE CHEST 1 VIEW COMPARISON:  02/03/2018 FINDINGS: Normal cardiac silhouette. Improvement in interstitial edema pattern seen on prior. No focal consolidation. No pleural fluid or pneumothorax. IMPRESSION: Improved interstitial edema pattern. Electronically Signed   By: Suzy Bouchard M.D.   On: 02/06/2018  08:30     ASSESSMENT AND PLAN:   72 year old female with hx of COPD, HTN, hx of previous CVA, A. Fib, Hypothyroidism, depression, hyperlipidemia, GERD who presented to the hospital due to shortness of breath wheezing and respiratory distress noted to be in COPD exacerbation.  1.  Acute on chronic respiratory failure with hypoxia-secondary to COPD  exacerbation//pulmonary edema -Secondary to influenza A.   -Continue IV steroids, scheduled duo nebs, Pulmicort nebs, Tamiflu.   -Received a dose of IV Lasix. Urine output 1300 mL. -Shows EF of 55 to 60%.  2.  COPD exacerbation- due to flu A.  -weaned off BiPAP.  Continue IV steroids, scheduled duo nebs, Pulmicort nebs. Slow to improve and remains bronchospastic presently.   3. Flu - pt. Is + for flu A - cont. Droplet precautions. Cont. Tamiflu.   4.  History of atrial fibrillation-rate controlled.  Continue Cardizem. -Continue Eliquis.    5.  Depression-continue Celexa.  6.  History of chronic diastolic CHF - cont. Bisoprolol, lasix. Clinically not in CHF.   7. DM type II -cont. SSI and follow BS  8. Hypothyroidism - cont. Synthroid.   9. Hyperlipidemia - cont. Pravachol.    All the records are reviewed and case discussed with Care Management/Social Worker. Management plans discussed with the patient, family and they are in agreement.  CODE STATUS: Full code  DVT Prophylaxis: Eliquis  TOTAL TIME TAKING CARE OF THIS PATIENT: 30 minutes.   POSSIBLE D/C IN 2-3 DAYS, DEPENDING ON CLINICAL CONDITION.   Fritzi Mandes M.D on 02/07/2018 at 2:20 PM  Between 7am to 6pm - Pager - [phone]  After 6pm go to [email or web address] - [facility]  Office  [phone]  CC: Primary care physician; [place]

## 2018-02-07 NOTE — Progress Notes (Addendum)
Pt urinated at 2230 after foley removal at 1630. Will continue to monitor.

## 2018-02-08 LAB — CULTURE, BLOOD (ROUTINE X 2)
Culture: NO GROWTH
Culture: NO GROWTH
Special Requests: ADEQUATE

## 2018-02-08 LAB — GLUCOSE, CAPILLARY
Glucose-Capillary: 134 mg/dL — ABNORMAL HIGH (ref 70–99)
Glucose-Capillary: 217 mg/dL — ABNORMAL HIGH (ref 70–99)
Glucose-Capillary: 125 mg/dL — ABNORMAL HIGH (ref 70–99)
Glucose-Capillary: 182 mg/dL — ABNORMAL HIGH (ref 70–99)

## 2018-02-08 MED ORDER — PREDNISONE 20 MG PO TABS
40.0000 mg | ORAL_TABLET | Freq: Every day | ORAL | Status: DC
  Administered 2018-02-09 – 2018-02-10 (×2): 40 mg via ORAL
  Filled 2018-02-08 (×2): qty 2

## 2018-02-08 NOTE — Plan of Care (Signed)
  Problem: Clinical Measurements: Goal: Respiratory complications will improve Outcome: Progressing   

## 2018-02-08 NOTE — Evaluation (Signed)
Physical Therapy Evaluation Patient Details Name: Summer Bradley MRN: [id number] DOB: 1945/06/30 Today's Date: 02/08/2018   History of Present Illness  72 yo female with onset of acute respiratory failure, admitted for flu A, now hypoxic and has COPD exacerbation.  PMHx:  chronic respiratory failure, COPD, depression, DM, hypothyroidism, HLD, CHF, craniotomy, a-fib, vertigo, stroke  Clinical Impression  Pt was seen for evaluation of gait and ck of O2 sats with pt in a droplet precaution situation.  With RW was min to mod assist to control standing balance, and with her time at home without assistance she is unsafe unless she has some rehab to increase strength and control of lateral balance.  Follow her with acute rehab to promote safety and to minimize the time she has to stay in rehab before returning home.    Follow Up Recommendations SNF    Equipment Recommendations  None recommended by PT    Recommendations for Other Services       Precautions / Restrictions Precautions Precautions: Fall Precaution Comments: telemetry, O2 Restrictions Weight Bearing Restrictions: No      Mobility  Bed Mobility Overal bed mobility: Needs Assistance Bed Mobility: Supine to Sit;Sit to Supine     Supine to sit: Min assist Sit to supine: Min assist   General bed mobility comments: minor help with turnk OOB and legs back to bed  Transfers Overall transfer level: Needs assistance Equipment used: Rolling walker (2 wheeled);1 person hand held assist Transfers: Sit to/from Stand Sit to Stand: Mod assist         General transfer comment: cued hand placement  Ambulation/Gait Ambulation/Gait assistance: Min assist;Mod assist Gait Distance (Feet): 45 [id number]) Assistive device: Rolling walker (2 wheeled) Gait Pattern/deviations: Step-through pattern;Step-to pattern;Decreased stride length;Drifts right/left;Wide base of support Gait velocity: reduced and variable Gait velocity  interpretation: <1.8 ft/sec, indicate of risk for recurrent falls    Stairs            Wheelchair Mobility    Modified Rankin (Stroke Patients Only)       Balance Overall balance assessment: Needs assistance Sitting-balance support: Feet supported Sitting balance-Leahy Scale: Fair     Standing balance support: Bilateral upper extremity supported;During functional activity Standing balance-Leahy Scale: Poor                               Pertinent Vitals/Pain Pain Assessment: No/denies pain    Home Living Family/patient expects to be discharged to:: Private residence Living Arrangements: Children(with son) Available Help at Discharge: Family;Available PRN/intermittently Type of Home: House Home Access: Stairs to enter   Entrance Stairs-Number of Steps: 1 Home Layout: One level Home Equipment: Walker - 2 wheels;Cane - single point      Prior Function Level of Independence: Independent with assistive device(s)               Hand Dominance   Dominant Hand: Right    Extremity/Trunk Assessment   Upper Extremity Assessment Upper Extremity Assessment: Overall WFL for tasks assessed    Lower Extremity Assessment Lower Extremity Assessment: Generalized weakness    Cervical / Trunk Assessment Cervical / Trunk Assessment: Normal  Communication   Communication: No difficulties  Cognition Arousal/Alertness: Awake/alert Behavior During Therapy: WFL for tasks assessed/performed Overall Cognitive Status: Within Functional Limits for tasks assessed  General Comments General comments (skin integrity, edema, etc.): pt is demonstrating unsteady standing and gait with lurches to either side unpredictably     Exercises     Assessment/Plan    PT Assessment Patient needs continued PT services  PT Problem List Decreased strength;Decreased range of motion;Decreased activity tolerance;Decreased  balance;Decreased mobility;Decreased coordination;Decreased knowledge of use of DME;Decreased safety awareness;Cardiopulmonary status limiting activity;Obesity       PT Treatment Interventions DME instruction;Gait training;Stair training;Functional mobility training;Therapeutic activities;Therapeutic exercise;Balance training;Neuromuscular re-education;Patient/family education    PT Goals (Current goals can be found in the Care Plan section)  Acute Rehab PT Goals Patient Stated Goal: to try to walk PT Goal Formulation: With patient/family Time For Goal Achievement: 02/22/18 Potential to Achieve Goals: Good    Frequency Min 2X/week   Barriers to discharge Decreased caregiver support;Inaccessible home environment      Co-evaluation               AM-PAC PT "6 Clicks" Mobility  Outcome Measure Help needed turning from your back to your side while in a flat bed without using bedrails?: A Little Help needed moving from lying on your back to sitting on the side of a flat bed without using bedrails?: A Lot Help needed moving to and from a bed to a chair (including a wheelchair)?: A Lot Help needed standing up from a chair using your arms (e.g., wheelchair or bedside chair)?: A Lot Help needed to walk in hospital room?: A Lot Help needed climbing 3-5 steps with a railing? : A Lot 6 Click Score: 13    End of Session Equipment Utilized During Treatment: Gait belt;Oxygen Activity Tolerance: Patient limited by fatigue;Treatment limited secondary to medical complications (Comment) Patient left: in bed;with call bell/phone within reach;with bed alarm set;with family/visitor present Nurse Communication: Mobility status;Other (comment)(O2 sat drop with walk to 87%) PT Visit Diagnosis: Unsteadiness on feet (R26.81);Muscle weakness (generalized) (M62.81);Ataxic gait (R26.0);Difficulty in walking, not elsewhere classified (R26.2);Adult, failure to thrive (R62.7)    Time: 1610-9604 PT Time  Calculation (min) (ACUTE ONLY): 23 min   Charges:   PT Evaluation $PT Eval Moderate Complexity: 1 Mod PT Treatments $Gait Training: 8-22 mins       Ramond Dial 02/08/2018, 8:41 PM   Mee Hives, PT MS Acute Rehab Dept. Number: [place] [facility] [place]

## 2018-02-08 NOTE — Progress Notes (Signed)
[place] [facility] NAME: Summer Bradley    MR#:  [id number]  DATE OF BIRTH:  09-27-1945  SUBJECTIVE:    Now on nasal cannula oxygen. Noted to be positive for influenza A  transferred out of ICU last night. Family in the room. REVIEW OF SYSTEMS:    Review of Systems  Constitutional: Negative for chills and fever.  HENT: Negative for congestion and tinnitus.   Eyes: Negative for blurred vision and double vision.  Respiratory: Positive for cough, shortness of breath and wheezing.   Cardiovascular: Negative for chest pain, orthopnea and PND.  Gastrointestinal: Negative for abdominal pain, diarrhea, nausea and vomiting.  Genitourinary: Negative for dysuria and hematuria.  Neurological: Negative for dizziness, sensory change and focal weakness.  All other systems reviewed and are negative.   Nutrition: Heart healthy/Carb control.  Tolerating Diet: Yes Tolerating PT: recommends rehab DRUG ALLERGIES:   Allergies  Allergen Reactions  . Lisinopril Cough    VITALS:  Blood pressure (!) 167/81, pulse (!) 102, temperature (!) 97.5 F (36.4 C), resp. rate (!) 22, height 5\' 2"  (1.575 m), weight 86.4 kg, SpO2 94 %.  PHYSICAL EXAMINATION:   Physical Exam  GENERAL:  72 y.o.-year-old patient sitting up in bed in no acute distress.  EYES: Pupils equal, round, reactive to light and accommodation. No scleral icterus. Extraocular muscles intact.  HEENT: Head atraumatic, normocephalic. Oropharynx and nasopharynx clear. [place]  NECK:  Supple, no jugular venous distention. No thyroid enlargement, no tenderness.  LUNGS: Poor A/e B/l. [place] wheezing b/l, No rales, rhonchi. No use of accessory muscles of respiration.  CARDIOVASCULAR: S1, S2 normal. No murmurs, rubs, or gallops.  ABDOMEN: Soft, nontender, nondistended. Bowel sounds present. No organomegaly or mass.  EXTREMITIES: No cyanosis, clubbing or edema b/l.    NEUROLOGIC: Cranial nerves II  through XII are intact. No focal Motor or sensory deficits b/l.   PSYCHIATRIC: The patient is alert and oriented x 3.  SKIN: No obvious rash, lesion, or ulcer.    LABORATORY PANEL:   CBC Recent Labs  Lab 02/07/18 0416  WBC 13.6*  HGB 15.9*  HCT 49.8*  PLT 317   ------------------------------------------------------------------------------------------------------------------  Chemistries  Recent Labs  Lab 02/03/18 0611  02/07/18 0416  NA 139   < > 140  K 3.7   < > 3.9  CL 103   < > 99  CO2 28   < > 30  GLUCOSE 120*   < > 164*  BUN 20   < > 42*  CREATININE 1.00   < > 1.16*  CALCIUM 9.1   < > 9.6  MG 1.8   < > 2.2  AST 27  --   --   ALT 15  --   --   ALKPHOS 53  --   --   BILITOT 1.1  --   --    < > = values in this interval not displayed.   ------------------------------------------------------------------------------------------------------------------  Cardiac Enzymes Recent Labs  Lab 02/03/18 0611  TROPONINI <0.03   ------------------------------------------------------------------------------------------------------------------  RADIOLOGY:  No results found.   ASSESSMENT AND PLAN:   72 year old female with hx of COPD, HTN, hx of previous CVA, A. Fib, Hypothyroidism, depression, hyperlipidemia, GERD who presented to the hospital due to shortness of breath wheezing and respiratory distress noted to be in COPD exacerbation.  1.  Acute on chronic respiratory failure with hypoxia-secondary to COPD exacerbation//pulmonary edema -Secondary to influenza A.   -Continue IV  steroids--- change to oral steroid taper, scheduled duo nebs, Pulmicort nebs, Tamiflu.   -Received a dose of IV Lasix. Urine output 1300 mL. -Shows EF of 55 to 60%.  2.  COPD exacerbation- due to flu A.  -weaned off BiPAP - po steroids, scheduled duo nebs, Pulmicort nebs. Slow to improve and remains bronchospastic presently.   3. Flu - pt. Is + for flu A - cont. Droplet precautions. Cont.  Tamiflu.   4.  History of atrial fibrillation-rate controlled.  Continue Cardizem. -Continue Eliquis.    5.  Depression-continue Celexa.  6.  History of chronic diastolic CHF - cont. Bisoprolol, lasix. Clinically not in CHF.   7. DM type II -cont. SSI and follow BS  8. Hypothyroidism - cont. Synthroid.   9. Hyperlipidemia - cont. Pravachol.   Physical therapy recommends rehab. Social worker for discharge planning.   CODE STATUS: Full code  DVT Prophylaxis: Eliquis  TOTAL TIME TAKING CARE OF THIS PATIENT: 30 minutes.   POSSIBLE D/C IN 1-2 DAYS, DEPENDING ON CLINICAL CONDITION.   Fritzi Mandes M.D on 02/08/2018 at 3:30 PM  Between 7am to 6pm - Pager - [phone]  After 6pm go to [email or web address] - [facility] Hospitalists  Office  [phone]  CC: Primary care physician; [place]

## 2018-02-08 NOTE — Progress Notes (Signed)
Pharmacy Electrolyte Monitoring Consult:  Pharmacy consulted to assist in monitoring and replacing electrolytes in this 72 y.o. female admitted on 02/03/2018 with Shortness of Breath   Labs:  Sodium (mmol/L)  Date Value  02/07/2018 140  01/14/2014 138   Potassium (mmol/L)  Date Value  02/07/2018 3.9  01/14/2014 5.0   Magnesium (mg/dL)  Date Value  02/07/2018 2.2  09/22/2013 1.8   Phosphorus (mg/dL)  Date Value  02/07/2018 4.4   Calcium (mg/dL)  Date Value  02/07/2018 9.6   Calcium, Total (mg/dL)  Date Value  01/14/2014 8.8   Albumin (g/dL)  Date Value  02/03/2018 3.9  01/10/2014 3.1 (L)    Assessment/Plan: Electrolytes did not need repletion yesterday and no labs today. Pharmacy will sign off CCM consult. Please re-consult if further assistance is desired.  Laural Benes, Pharm.D., BCPS Clinical Pharmacist 02/08/2018 9:54 AM

## 2018-02-08 NOTE — Progress Notes (Signed)
PT Cancellation Note  Patient Details Name: Jelesa Mangini MRN: [id number] DOB: [birth year]   Cancelled Treatment:    Reason Eval/Treat Not Completed: Other (comment).  Pt was just presented a tray for lunch as PT returned from getting protective equipment for entrance to her room.  Try again at another time.   Ramond Dial 02/08/2018, 12:52 PM   Mee Hives, PT MS Acute Rehab Dept. Number: [place]

## 2018-02-09 LAB — GLUCOSE, CAPILLARY
Glucose-Capillary: 91 mg/dL (ref 70–99)
Glucose-Capillary: 166 mg/dL — ABNORMAL HIGH (ref 70–99)
Glucose-Capillary: 152 mg/dL — ABNORMAL HIGH (ref 70–99)
Glucose-Capillary: 109 mg/dL — ABNORMAL HIGH (ref 70–99)

## 2018-02-09 NOTE — Plan of Care (Signed)
  Problem: Education: Goal: Knowledge of General Education information will improve Description: Including pain rating scale, medication(s)/side effects and non-pharmacologic comfort measures Outcome: Progressing   Problem: Clinical Measurements: Goal: Ability to maintain clinical measurements within normal limits will improve Outcome: Progressing Goal: Respiratory complications will improve Outcome: Progressing   

## 2018-02-09 NOTE — Clinical Social Work Placement (Signed)
   CLINICAL SOCIAL WORK PLACEMENT  NOTE  Date:  02/09/2018  Patient Details  Name: Summer Bradley MRN: [id number] Date of Birth: 11-Oct-1945  Clinical Social Work is seeking post-discharge placement for this patient at the [place] level of care (*CSW will initial, date and re-position this form in  chart as items are completed):  Yes   Patient/family provided with [facility] Work Department's list of facilities offering this level of care within the geographic area requested by the patient (or if unable, by the patient's family).  Yes   Patient/family informed of their freedom to choose among providers that offer the needed level of care, that participate in Medicare, Medicaid or managed care program needed by the patient, have an available bed and are willing to accept the patient.  Yes   Patient/family informed of [facility]'s ownership interest in [facility] and [facility], as well as of the fact that they are under no obligation to receive care at these facilities.  PASRR submitted to EDS on       PASRR number received on       Existing PASRR number confirmed on 02/09/18     FL2 transmitted to all facilities in geographic area requested by pt/family on 02/09/18     FL2 transmitted to all facilities within larger geographic area on       Patient informed that his/her managed care company has contracts with or will negotiate with certain facilities, including the following:            Patient/family informed of bed offers received.  Patient chooses bed at       Physician recommends and patient chooses bed at      Patient to be transferred to   on  .  Patient to be transferred to facility by       Patient family notified on   of transfer.  Name of family member notified:        PHYSICIAN       Additional Comment:    _______________________________________________ Summer Bradley, Veronia Beets, LCSW 02/09/2018, 1:46 PM

## 2018-02-09 NOTE — NC FL2 (Signed)
[place] LEVEL OF CARE SCREENING TOOL     IDENTIFICATION  Patient Name: Summer Bradley Birthdate: [birth year] Sex: female Admission Date (Current Location): 02/03/2018  [facility] [place] Number:  [name] ([phone] [facility]) Facility and Address:  [facility], [address] [place], [place] [address]      Provider Number: [id number]  Attending Physician Name and Address:  Fritzi Mandes, MD  Relative Name and Phone Number:       Current Level of Care: Hospital Recommended Level of Care: [place] Prior Approval Number:    Date Approved/Denied:   PASRR Number: [id number])  Discharge Plan: SNF    Current Diagnoses: Patient Active Problem List   Diagnosis Date Noted  . Acute on chronic respiratory failure with hypoxemia ([place]) 02/03/2018  . Pulmonary emphysema ([place]) 04/25/2015  . Emphysema of lung ([place]) 05/09/2014  . 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[place]) 05/09/2014  . Hyperlipidemia 05/09/2014  . GI bleed   . Benign neoplasm of colon 03/03/2014  . Diverticulosis of colon without hemorrhage 03/03/2014  . 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 Jejunal ulcer 03/03/2014  . Gastric polyps 03/03/2014  . Acute posthemorrhagic anemia 03/02/2014  . Melena 02/27/2014  . LGI bleed   . Acute on chronic respiratory failure ([place]) 02/16/2014  . 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 Aspiration pneumonitis ([place])   . Spontaneous pneumothorax   . Thrush   . COPD exacerbation ([place])   . SOB (shortness of breath)   . HLD (hyperlipidemia)   . Essential hypertension   . Dysphagia, pharyngoesophageal phase   . Acute tracheobronchitis 02/04/2014  . Atelectasis of left lung 02/04/2014  . Wheezing   . Acute respiratory failure ([place])   . Paroxysmal atrial fibrillation (H[facility])   . Cerebral infarction due to  occlusion or stenosis of precerebral artery ([place])   . 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 Stroke ([place]) 01/29/2014  . Respiratory failure ([place]) 01/29/2014    Orientation RESPIRATION BLADDER Height & Weight     Self, Time, Situation, Place  Normal, Other (Comment)(Bi-pap at night. ) Incontinent Weight: 195 lb (88.5 kg) Height:  5\' 2"  (157.5 cm)  BEHAVIORAL SYMPTOMS/MOOD NEUROLOGICAL BOWEL NUTRITION STATUS      Continent Diet(Diet: Heart Healthy/ Carb Modified. )  AMBULATORY STATUS COMMUNICATION OF NEEDS Skin   Extensive Assist Verbally Normal                       Personal Care Assistance Level of Assistance  Bathing, Feeding, Dressing Bathing Assistance: Limited assistance Feeding assistance: Independent Dressing Assistance: Limited assistance     Functional Limitations Info  Sight, Hearing, Speech Sight Info: Adequate Hearing Info: Adequate Speech Info: Adequate    SPECIAL CARE FACTORS FREQUENCY  PT (By licensed PT), OT (By licensed OT)     PT Frequency: (5) OT Frequency: (5)            Contractures      Additional Factors Info  Code Status, Allergies, Isolation Precautions Code Status Info: (Full Code. ) Allergies Info: (Lisinopril)     Isolation Precautions Info: (Isolation for the flu)     Current Medications (02/09/2018):  This is the current hospital active medication list Current Facility-Administered Medications  Medication Dose Route Frequency Provider Last Rate Last Dose  . acetaminophen (TYLENOL) tablet 650 mg  650 mg Oral Q6H  PRN Arta Silence, MD   650 mg at 02/09/18 6948   Or  . acetaminophen (TYLENOL) suppository 650 mg  650 mg Rectal Q6H PRN Arta Silence, MD      . apixaban (ELIQUIS) tablet 5 mg  5 mg Oral BID Arta Silence, MD   5 mg at 02/09/18 0917  . bisacodyl (DULCOLAX) EC tablet 5 mg  5 mg Oral Daily PRN Arta Silence, MD   5 mg at 02/06/18 2150  . bisoprolol (ZEBETA) tablet 2.5 mg   2.5 mg Oral BID Arta Silence, MD   2.5 mg at 02/09/18 0918  . budesonide (PULMICORT) nebulizer solution 0.5 mg  0.5 mg Nebulization BID Flora Lipps, MD   0.5 mg at 02/09/18 0737  . calcium-vitamin D (OSCAL WITH D) 500-200 MG-UNIT per tablet 1 tablet  1 tablet Oral BID Arta Silence, MD   1 tablet at 02/09/18 0917  . citalopram (CELEXA) tablet 40 mg  40 mg Oral QHS Arta Silence, MD   40 mg at 02/08/18 2129  . diltiazem (CARDIZEM CD) 24 hr capsule 240 mg  240 mg Oral Daily Arta Silence, MD   240 mg at 02/09/18 0917  . guaiFENesin-codeine 100-10 MG/5ML solution 5 mL  5 mL Oral Q4H PRN Flora Lipps, MD   5 mL at 02/09/18 0958  . insulin aspart (novoLOG) injection 0-5 Units  0-5 Units Subcutaneous QHS Sridharan, Prasanna, MD      . insulin aspart (novoLOG) injection 0-9 Units  0-9 Units Subcutaneous TID WC Arta Silence, MD   2 Units at 02/09/18 1204  . ipratropium-albuterol (DUONEB) 0.5-2.5 (3) MG/3ML nebulizer solution 3 mL  3 mL Nebulization Q6H Fritzi Mandes, MD   3 mL at 02/09/18 0737  . levothyroxine (SYNTHROID, LEVOTHROID) tablet 150 mcg  150 mcg Oral QAC breakfast Arta Silence, MD   150 mcg at 02/09/18 5462  . ondansetron (ZOFRAN) injection 4 mg  4 mg Intravenous Q6H PRN Arta Silence, MD      . phenol (CHLORASEPTIC) mouth spray 1 spray  1 spray Mouth/Throat PRN Flora Lipps, MD   1 spray at 02/09/18 0920  . pravastatin (PRAVACHOL) tablet 10 mg  10 mg Oral q1800 Arta Silence, MD   10 mg at 02/08/18 1711  . predniSONE (DELTASONE) tablet 40 mg  40 mg Oral Q breakfast Fritzi Mandes, MD   40 mg at 02/09/18 7035  . QUEtiapine (SEROQUEL) tablet 50 mg  50 mg Oral QHS Flora Lipps, MD   50 mg at 02/08/18 2129  . senna-docusate (Senokot-S) tablet 1 tablet  1 tablet Oral QHS PRN Arta Silence, MD      . senna-docusate (Senokot-S) tablet 1 tablet  1 tablet Oral BID Wilhelmina Mcardle, MD   1 tablet at 02/09/18 0093  . zolpidem (AMBIEN) tablet 5 mg  5 mg  Oral QHS PRN Tukov-Yual, Arlyss Gandy, NP   5 mg at 02/07/18 2241     Discharge Medications: Please see discharge summary for a list of discharge medications.  Relevant Imaging Results:  Relevant Lab Results:   Additional Information (SSN: [id number])  Delmont Prosch, Veronia Beets, LCSW

## 2018-02-09 NOTE — Clinical Social Work Note (Signed)
Clinical Social Work Assessment  Patient Details  Name: Summer Bradley MRN: [id number] Date of Birth: 03/29/1945  Date of referral:  02/09/18               Reason for consult:  Facility Placement                Permission sought to share information with:  Chartered certified accountant granted to share information::  Yes, Verbal Permission Granted  Name::        Agency::     Relationship::     Contact Information:     Housing/Transportation Living arrangements for the past 2 months:  Single Family Home Source of Information:  Adult Children, Power of Attorney Patient Interpreter Needed:  None Criminal Activity/Legal Involvement Pertinent to Current Situation/Hospitalization:  No - Comment as needed Significant Relationships:  Adult Children Lives with:  Adult Children Do you feel safe going back to the place where you live?  Yes Need for family participation in patient care:  Yes (Comment)  Care giving concerns: Patient lives in [place] and her son Kennyth Lose is staying with her temporarily.    Social Worker assessment / plan: Holiday representative (CSW) received verbal consult from MD that patient needs SNF. Per MD patient needs Bi-pap at night and is done with her course of tamiflu. CSW contacted patient's daughter [name] to discuss D/C plan. Per daughter she is patient's HPOA and [facility] the [facility] program. CSW explained that [facility] contracts for 3 facilities in [facility], [place]. CSW also explained that bed offers may be limited because patient is positive for the flu. Daughter verbalized her understanding and is agreeable to SNF search in [place]. FL2 complete and faxed out. CSW will continue to follow and assist as needed.   Employment status:  Disabled (Comment on whether or not currently receiving Disability), Retired Forensic scientist:  Other (Comment Required)([facility] ) PT Recommendations:  [place] / Referral to community resources:  [place]  Patient/Family's Response to care:  Patient's daughter is agreeable to [facility] in [place].   Patient/Family's Understanding of and Emotional Response to Diagnosis, Current Treatment, and Prognosis:  Patient's daughter was very pleasant and thanked CSW for assistance.   Emotional Assessment Appearance:  Appears stated age Attitude/Demeanor/Rapport:  Unable to Assess Affect (typically observed):  Unable to Assess Orientation:  Oriented to Self, Oriented to Place, Fluctuating Orientation (Suspected and/or reported [place]) Alcohol / Substance use:  Not Applicable Psych involvement (Current and /or in the community):  No (Comment)  Discharge Needs  Concerns to be addressed:  Discharge Planning Concerns Readmission within the last 30 days:  No Current discharge risk:  Dependent with Mobility Barriers to Discharge:  Continued Medical Work up   [employer], Veronia Beets, LCSW 02/09/2018, 1:47 PM

## 2018-02-09 NOTE — Progress Notes (Signed)
[place] [facility] [place] NAME: Summer Bradley    MR#:  [id number]  DATE OF BIRTH:  04-27-1945  SUBJECTIVE:    Now on nasal cannula oxygen. Noted to be positive for influenza A  Family in the room.  Pt looks more perked up today. REVIEW OF SYSTEMS:    Review of Systems  Constitutional: Negative for chills and fever.  HENT: Negative for congestion and tinnitus.   Eyes: Negative for blurred vision and double vision.  Respiratory: Positive for cough, shortness of breath and wheezing.   Cardiovascular: Negative for chest pain, orthopnea and PND.  Gastrointestinal: Negative for abdominal pain, diarrhea, nausea and vomiting.  Genitourinary: Negative for dysuria and hematuria.  Neurological: Negative for dizziness, sensory change and focal weakness.  All other systems reviewed and are negative.   Nutrition: Heart healthy/Carb control.  Tolerating Diet: Yes Tolerating PT: recommends rehab DRUG ALLERGIES:   Allergies  Allergen Reactions  . Lisinopril Cough    VITALS:  Blood pressure (!) 152/90, pulse 83, temperature 98.5 F (36.9 C), temperature source Oral, resp. rate 19, height 5\' 2"  (1.575 m), weight 88.5 kg, SpO2 93 %.  PHYSICAL EXAMINATION:   Physical Exam  GENERAL:  72 y.o.-year-old patient sitting up in bed in no acute distress.  EYES: Pupils equal, round, reactive to light and accommodation. No scleral icterus. Extraocular muscles intact.  HEENT: Head atraumatic, normocephalic. Oropharynx and nasopharynx clear. [place]  NECK:  Supple, no jugular venous distention. No thyroid enlargement, no tenderness.  LUNGS: Poor A/e B/l. [place] wheezing b/l, No rales, rhonchi. No use of accessory muscles of respiration.  CARDIOVASCULAR: S1, S2 normal. No murmurs, rubs, or gallops.  ABDOMEN: Soft, nontender, nondistended. Bowel sounds present. No organomegaly or mass.  EXTREMITIES: No cyanosis, clubbing or edema b/l.    NEUROLOGIC: Cranial  nerves II through XII are intact. No focal Motor or sensory deficits b/l.   PSYCHIATRIC: The patient is alert and oriented x 3.  SKIN: No obvious rash, lesion, or ulcer.    LABORATORY PANEL:   CBC Recent Labs  Lab 02/07/18 0416  WBC 13.6*  HGB 15.9*  HCT 49.8*  PLT 317   ------------------------------------------------------------------------------------------------------------------  Chemistries  Recent Labs  Lab 02/03/18 0611  02/07/18 0416  NA 139   < > 140  K 3.7   < > 3.9  CL 103   < > 99  CO2 28   < > 30  GLUCOSE 120*   < > 164*  BUN 20   < > 42*  CREATININE 1.00   < > 1.16*  CALCIUM 9.1   < > 9.6  MG 1.8   < > 2.2  AST 27  --   --   ALT 15  --   --   ALKPHOS 53  --   --   BILITOT 1.1  --   --    < > = values in this interval not displayed.   ------------------------------------------------------------------------------------------------------------------  Cardiac Enzymes Recent Labs  Lab 02/03/18 0611  TROPONINI <0.03   ------------------------------------------------------------------------------------------------------------------  RADIOLOGY:  No results found.   ASSESSMENT AND PLAN:   72 year old female with hx of COPD, HTN, hx of previous CVA, A. Fib, Hypothyroidism, depression, hyperlipidemia, GERD who presented to the hospital due to shortness of breath wheezing and respiratory distress noted to be in COPD exacerbation.  1.  Acute on chronic respiratory failure with hypoxia-secondary to COPD exacerbation//pulmonary edema -prn BIPAP -Secondary to influenza A.   -  Continue IV steroids--- change to oral steroid taper, scheduled duo nebs, Pulmicort nebs, Tamiflu.   -Received a dose of IV Lasix. Urine output 1300 mL. -Shows EF of 55 to 60%.  2.  COPD exacerbation- due to flu A.  -weaned off BiPAP - po steroids, scheduled duo nebs, Pulmicort nebs. Slow to improve and remains bronchospastic presently.   3. Flu - pt. Is + for flu A - cont.  Droplet precautions. Cont. Tamiflu.   4.  History of atrial fibrillation-rate controlled.  Continue Cardizem. -Continue Eliquis.    5.  Depression-continue Celexa.  6.  History of chronic diastolic CHF - cont. Bisoprolol, lasix. Clinically not in CHF.   7. DM type II -cont. SSI and follow BS  8. Hypothyroidism - cont. Synthroid.   9. Hyperlipidemia - cont. Pravachol.   Physical therapy recommends rehab. Social worker for discharge planning.   CODE STATUS: Full code  DVT Prophylaxis: Eliquis  TOTAL TIME TAKING CARE OF THIS PATIENT: 30 minutes.   POSSIBLE D/C IN 1-2 DAYS, DEPENDING ON CLINICAL CONDITION.   Fritzi Mandes M.D on 02/09/2018 at 12:43 PM  Between 7am to 6pm - Pager - [phone]  After 6pm go to [email or web address] - [facility] Hospitalists  Office  [phone]  CC: Primary care physician; [place]

## 2018-02-10 LAB — GLUCOSE, CAPILLARY
Glucose-Capillary: 75 mg/dL (ref 70–99)
Glucose-Capillary: 110 mg/dL — ABNORMAL HIGH (ref 70–99)

## 2018-02-10 LAB — CBC
HCT: 49.8 % — ABNORMAL HIGH (ref 36.0–46.0)
Hemoglobin: 16.2 g/dL — ABNORMAL HIGH (ref 12.0–15.0)
MCH: 30.5 pg (ref 26.0–34.0)
MCHC: 32.5 g/dL (ref 30.0–36.0)
MCV: 93.8 fL (ref 80.0–100.0)
Platelets: 308 10*3/uL (ref 150–400)
RBC: 5.31 MIL/uL — ABNORMAL HIGH (ref 3.87–5.11)
RDW: 13.5 % (ref 11.5–15.5)
WBC: 20.4 10*3/uL — ABNORMAL HIGH (ref 4.0–10.5)
nRBC: 0 % (ref 0.0–0.2)

## 2018-02-10 LAB — CREATININE, SERUM
Creatinine, Ser: 0.91 mg/dL (ref 0.44–1.00)
GFR calc Af Amer: >60 mL/min (ref >60)
GFR calc non Af Amer: >60 mL/min (ref >60)

## 2018-02-10 MED ORDER — PREDNISONE 10 MG PO TABS: 40.0000 mg | ORAL_TABLET | Freq: Every day | ORAL | 0 refills | Status: DC

## 2018-02-10 MED ORDER — FUROSEMIDE 10 MG/ML IJ SOLN
40.0000 mg | Freq: Once | INTRAMUSCULAR | Status: AC
  Administered 2018-02-10: 40 mg via INTRAVENOUS
  Filled 2018-02-10: qty 4

## 2018-02-10 MED ORDER — ORAL CARE MOUTH RINSE
15.0000 mL | Freq: Two times a day (BID) | OROMUCOSAL | Status: DC
  Administered 2018-02-10: 15 mL via OROMUCOSAL

## 2018-02-10 MED ORDER — FUROSEMIDE 20 MG PO TABS: 20.0000 mg | ORAL_TABLET | Freq: Two times a day (BID) | ORAL | Status: DC

## 2018-02-10 MED ORDER — GUAIFENESIN-CODEINE 100-10 MG/5ML PO SOLN: 5.0000 mL | ORAL | 0 refills | Status: DC | PRN

## 2018-02-10 NOTE — Plan of Care (Signed)
  Problem: Education: Goal: Knowledge of General Education information will improve Description Including pain rating scale, medication(s)/side effects and non-pharmacologic comfort measures Outcome: Adequate for Discharge   Problem: Health Behavior/Discharge Planning: Goal: Ability to manage health-related needs will improve Outcome: Adequate for Discharge   Problem: Clinical Measurements: Goal: Ability to maintain clinical measurements within normal limits will improve Outcome: Adequate for Discharge Goal: Respiratory complications will improve Outcome: Adequate for Discharge   Problem: Activity: Goal: Risk for activity intolerance will decrease Outcome: Adequate for Discharge

## 2018-02-10 NOTE — Care Management Important Message (Signed)
Important Message  Patient Details  Name: Summer Bradley MRN: [id number] Date of Birth: 02-09-1946   Medicare Important Message Given:  Yes    Elza Rafter, RN 02/10/2018, 1:52 PM

## 2018-02-10 NOTE — Plan of Care (Signed)
  Problem: Education: Goal: Knowledge of General Education information will improve Description: Including pain rating scale, medication(s)/side effects and non-pharmacologic comfort measures Outcome: Progressing   Problem: Clinical Measurements: Goal: Respiratory complications will improve Outcome: Progressing   Problem: Activity: Goal: Risk for activity intolerance will decrease Outcome: Progressing   

## 2018-02-10 NOTE — Progress Notes (Signed)
[facility] transportation transporting patient to facility. Patient discharged in stable condition. Was able to stand and transfer to wheelchair.

## 2018-02-10 NOTE — Clinical Social Work Note (Signed)
Patient to be d/c'ed today to [facility] SNF room [phone].  Patient and family agreeable to plans will transport via [facility] transportation RN to call report to [phone].  Patient's daughter is at bedside and is aware that patient is discharging today.  Evette Cristal, MSW, [place]

## 2018-02-10 NOTE — Progress Notes (Signed)
Report given to [facility]. [facility] to transport patient to [facility]. Patient and family agreeable.

## 2018-02-10 NOTE — Clinical Social Work Placement (Signed)
   CLINICAL SOCIAL WORK PLACEMENT  NOTE  Date:  02/10/2018  Patient Details  Name: Summer Bradley MRN: [id number] Date of Birth: 1945/03/27  Clinical Social Work is seeking post-discharge placement for this patient at the [place] level of care (*CSW will initial, date and re-position this form in  chart as items are completed):  Yes   Patient/family provided with [facility] Work Department's list of facilities offering this level of care within the geographic area requested by the patient (or if unable, by the patient's family).  Yes   Patient/family informed of their freedom to choose among providers that offer the needed level of care, that participate in Medicare, Medicaid or managed care program needed by the patient, have an available bed and are willing to accept the patient.  Yes   Patient/family informed of [facility]'s ownership interest in [facility] and [facility], as well as of the fact that they are under no obligation to receive care at these facilities.  PASRR submitted to EDS on       PASRR number received on       Existing PASRR number confirmed on 02/09/18     FL2 transmitted to all facilities in geographic area requested by pt/family on 02/09/18     FL2 transmitted to all facilities within larger geographic area on       Patient informed that his/her managed care company has contracts with or will negotiate with certain facilities, including the following:        Yes   Patient/family informed of bed offers received.  Patient chooses bed at [facility]     Physician recommends and patient chooses bed at      Patient to be transferred to [facility] on 02/10/18.  Patient to be transferred to facility by [facility]     Patient family notified on 02/10/18 of transfer.  Name of family member notified:  [name] patient's daughter was at bedside.     PHYSICIAN Please sign  FL2     Additional Comment:    _______________________________________________ Ross Ludwig, LCSWA 02/10/2018, 2:47 PM

## 2018-02-10 NOTE — Discharge Summary (Signed)
[facility] [place] NAME: Summer Bradley    MR#:  [id number]  DATE OF BIRTH:  1945/09/11  DATE OF ADMISSION:  02/03/2018 ADMITTING PHYSICIAN: Arta Silence, MD  DATE OF DISCHARGE: 02/10/2018*  PRIMARY CARE PHYSICIAN: [place]    ADMISSION DIAGNOSIS:  COPD exacerbation ([place]) [J44.1]  DISCHARGE DIAGNOSIS:  acute on chronic respiratory failure with hypoxia secondary to COPD exacerbation influenza A completed treatment  SECONDARY DIAGNOSIS:   Past Medical History:  Diagnosis Date  . A-fib ([place])   . Anxiety   . COPD (chronic obstructive pulmonary disease) ([place])   . Cough    CHONIC  . Depression   . Dysrhythmia   . GERD (gastroesophageal reflux disease)   . Heart disease   . High cholesterol   . Hypertension   . Hypothyroidism   . Orthopnea   . Oxygen dependent    2/L CONTINUOUSLY  . Stroke ([place])   . Vertigo   . Wheezing     HOSPITAL COURSE:   72 year old female with hx of COPD, HTN, hx of previous CVA, A. Fib, Hypothyroidism, depression, hyperlipidemia, GERD who presented to the hospital due to shortness of breath wheezing and respiratory distress noted to be in COPD exacerbation.  1.  Acute on chronic respiratory failure with hypoxia-secondary to COPD exacerbation//pulmonary edema -prn BIPAP -Secondary to influenza A.   -Continue IV steroids--- changed to oral steroid taper, scheduled duo nebs, Pulmicort nebs, Tamiflu.   -Received a dose of IV Lasix. Urine output 1300 mL. -Shows EF of 55 to 60%. -Elevated due to steroids. Patient has not had fever. She is clinically improving.  2.  COPD exacerbation- due to flu A.  -weaned off BiPAP - po steroids, scheduled duo nebs, Pulmicort nebs.  -Overall much improved.  3. Flu - pt. Is + for flu A - cont. Droplet precautions. Completed five days of Tamiflu.   4.  History of atrial fibrillation-rate controlled.   -Continue  Cardizem. -Continue Eliquis.    5.  Depression-continue Celexa.  6.  History of chronic diastolic CHF - cont. Bisoprolol, lasix. Clinically not in CHF.  -Resumed home dose of Lasix. -Received couple IV Lasix intermittently  7. DM type II -cont. SSI and follow BS  8. Hypothyroidism - cont. Synthroid.   9. Hyperlipidemia - cont. Pravachol.   Overall seems to have improved. She will be discharged to [facility] for physical therapy.  Discussed with patient's daughter Janace Hoard on the phone. She is agreeable with plan. CONSULTS OBTAINED:  Treatment Team:  Arta Silence, MD Corey Skains, MD Fritzi Mandes, MD  DRUG ALLERGIES:   Allergies  Allergen Reactions  . Lisinopril Cough    DISCHARGE MEDICATIONS:   Allergies as of 02/10/2018      Reactions   Lisinopril Cough      Medication List    STOP taking these medications   BROMSITE 0.075 % Soln Generic drug:  Bromfenac Sodium   DUREZOL 0.05 % Emul Generic drug:  Difluprednate   gatifloxacin 0.5 % Soln Commonly known as:  ZYMAXID   moxifloxacin 0.5 % ophthalmic solution Commonly known as:  VIGAMOX     TAKE these medications   albuterol 108 (90 Base) MCG/ACT inhaler Commonly known as:  PROVENTIL HFA;VENTOLIN HFA Inhale 2 puffs into the lungs every 4 (four) hours as needed for wheezing or shortness of breath.   albuterol (2.5 MG/3ML) 0.083% nebulizer solution Commonly known as:  PROVENTIL Take 2.5 mg by  nebulization every 6 (six) hours as needed for wheezing or shortness of breath.   apixaban 5 MG Tabs tablet Commonly known as:  ELIQUIS Take 1 tablet (5 mg total) by mouth 2 (two) times daily.   AYR 0.65 % nasal spray Generic drug:  sodium chloride Place 2 sprays into the nose every 2 (two) hours as needed for congestion.   bisoprolol 5 MG tablet Commonly known as:  ZEBETA Take 0.5 tablets (2.5 mg total) by mouth 2 (two) times daily.   budesonide-formoterol 160-4.5 MCG/ACT inhaler Commonly  known as:  SYMBICORT Inhale 2 puffs into the lungs 2 (two) times daily.   Calcium Carbonate-Vitamin D 600-400 MG-UNIT tablet Take 1 tablet by mouth 2 (two) times daily.   citalopram 40 MG tablet Commonly known as:  CELEXA Take 40 mg by mouth at bedtime.   diltiazem 240 MG 24 hr capsule Commonly known as:  CARDIZEM CD Take 240 mg by mouth daily.   furosemide 20 MG tablet Commonly known as:  LASIX Take 1 tablet (20 mg total) by mouth 2 (two) times daily.   guaiFENesin-codeine 100-10 MG/5ML syrup Take 5 mLs by mouth every 4 (four) hours as needed for cough.   levothyroxine 150 MCG tablet Commonly known as:  SYNTHROID, LEVOTHROID Take 150 mcg by mouth daily before breakfast.   lovastatin 10 MG tablet Commonly known as:  MEVACOR Take 10 mg by mouth daily.   potassium chloride SA 20 MEQ tablet Commonly known as:  K-DUR,KLOR-CON Take 2 tablets (40 mEq total) by mouth 2 (two) times daily.   predniSONE 10 MG tablet Commonly known as:  DELTASONE Take 4 tablets (40 mg total) by mouth daily with breakfast. Taper by 10 mg daily then stop Start taking on:  February 11, 2018   QUEtiapine 25 MG tablet Commonly known as:  SEROQUEL Take 25 mg by mouth at bedtime.   SPIRIVA RESPIMAT 2.5 MCG/ACT Aers Generic drug:  Tiotropium Bromide Monohydrate Inhale 1 puff into the lungs daily.   SYSTANE ULTRA OP Place 1 drop into both eyes every 4 (four) hours as needed (dry eyes).       If you experience worsening of your admission symptoms, develop shortness of breath, life threatening emergency, suicidal or homicidal thoughts you must seek medical attention immediately by calling 911 or calling your MD immediately  if symptoms less severe.  You Must read complete instructions/literature along with all the possible adverse reactions/side effects for all the Medicines you take and that have been prescribed to you. Take any new Medicines after you have completely understood and accept all the  possible adverse reactions/side effects.   Please note  You were cared for by a hospitalist during your hospital stay. If you have any questions about your discharge medications or the care you received while you were in the hospital after you are discharged, you can call the unit and asked to speak with the hospitalist on call if the hospitalist that took care of you is not available. Once you are discharged, your primary care physician will handle any further medical issues. Please note that NO REFILLS for any discharge medications will be authorized once you are discharged, as it is imperative that you return to your primary care physician (or establish a relationship with a primary care physician if you do not have one) for your aftercare needs so that they can reassess your need for medications and monitor your lab values. Today   SUBJECTIVE   I slept very well. I  feel better. Some cough.  VITAL SIGNS:  Blood pressure (!) 149/104, pulse 87, temperature 98.2 F (36.8 C), temperature source Oral, resp. rate 20, height 5\' 2"  (1.575 m), weight 88.5 kg, SpO2 95 %.  I/O:    Intake/Output Summary (Last 24 hours) at 02/10/2018 1256 Last data filed at 02/10/2018 0553 Gross per 24 hour  Intake 240 ml  Output 200 ml  Net 40 ml    PHYSICAL EXAMINATION:  GENERAL:  72 y.o.-year-old patient lying in the bed with no acute distress. Chronically ill EYES: Pupils equal, round, reactive to light and accommodation. No scleral icterus. Extraocular muscles intact.  HEENT: Head atraumatic, normocephalic. Oropharynx and nasopharynx clear.  NECK:  Supple, no jugular venous distention. No thyroid enlargement, no tenderness.  LUNGS: coarse breath sounds bilaterally, no wheezing, rales,rhonchi or crepitation. No use of accessory muscles of respiration.  CARDIOVASCULAR: S1, S2 normal. No murmurs, rubs, or gallops.  ABDOMEN: Soft, non-tender, non-distended. Bowel sounds present. No organomegaly or mass.   EXTREMITIES: No pedal edema, cyanosis, or clubbing.  NEUROLOGIC: Cranial nerves II through XII are intact. Muscle strength 5/5 in all extremities. Sensation intact. Gait not checked.  PSYCHIATRIC: The patient is alert and oriented x 3.  SKIN: No obvious rash, lesion, or ulcer.   DATA REVIEW:   CBC  Recent Labs  Lab 02/10/18 1228  WBC 20.4*  HGB 16.2*  HCT 49.8*  PLT 308    Chemistries  Recent Labs  Lab 02/07/18 0416 02/10/18 1228  NA 140  --   K 3.9  --   CL 99  --   CO2 30  --   GLUCOSE 164*  --   BUN 42*  --   CREATININE 1.16* 0.91  CALCIUM 9.6  --   MG 2.2  --     Microbiology Results   Recent Results (from the past 240 hour(s))  Blood culture (routine x 2)     Status: None   Collection Time: 02/03/18  6:11 AM  Result Value Ref Range Status   Specimen Description BLOOD RIGHT WRIST  Final   Special Requests   Final    BOTTLES DRAWN AEROBIC AND ANAEROBIC Blood Culture results may not be optimal due to an inadequate volume of blood received in culture bottles   Culture   Final    NO GROWTH 5 DAYS Performed at [facility], [address] [place], [place] [address]    Report Status 02/08/2018 FINAL  Final  Blood culture (routine x 2)     Status: None   Collection Time: 02/03/18  6:36 AM  Result Value Ref Range Status   Specimen Description BLOOD RIGHT [facility]  Final   Special Requests   Final    BOTTLES DRAWN AEROBIC AND ANAEROBIC Blood Culture adequate volume   Culture   Final    NO GROWTH 5 DAYS Performed at [facility], [address] [place], [place] [address]    Report Status 02/08/2018 FINAL  Final  MRSA PCR Screening     Status: None   Collection Time: 02/03/18  9:35 AM  Result Value Ref Range Status   MRSA by PCR NEGATIVE NEGATIVE Final    Comment:        The GeneXpert MRSA Assay (FDA approved for NASAL specimens only), is one component of a comprehensive MRSA colonization surveillance program. It is not intended to  diagnose MRSA infection nor to guide or monitor treatment for MRSA infections. Performed at [facility], [address] [place], [place] [address]     RADIOLOGY:  No results found.   CODE STATUS:     Code Status Orders  (From admission, onward)         Start     Ordered   02/03/18 0934  Full code  Continuous     02/03/18 0933        Code Status History    Date Active Date Inactive Code Status Order ID Comments User Context   02/02/2014 0933 03/05/2014 1624 Full Code [id number]  Wilhelmina Mcardle, MD Inpatient   02/01/2014 1236 02/02/2014 0933 Full Code [id number]  Rob Hickman, MD Inpatient   01/29/2014 1119 02/01/2014 1236 Full Code [id number]  Roland Rack, MD Inpatient    Advance Directive Documentation     Most Recent Value  Type of Advance Directive  Living will  Pre-existing out of facility DNR order (yellow form or pink MOST form)  -  "MOST" Form in Place?  -      TOTAL TIME TAKING CARE OF THIS PATIENT: **40* minutes.    Fritzi Mandes M.D on 02/10/2018 at 12:56 PM  Between 7am to 6pm - Pager - [phone] After 6pm go to [email or web address] - password [email or web address] [facility]  Office  [phone]  CC: Primary care physician; [place]

## 2018-12-23 ENCOUNTER — Other Ambulatory Visit: Payer: Self-pay

## 2018-12-23 ENCOUNTER — Emergency Department: Payer: Medicare (Managed Care)

## 2018-12-23 ENCOUNTER — Inpatient Hospital Stay
Admission: EM | Admit: 2018-12-23 | Discharge: 2019-01-03 | DRG: 190 | Disposition: A | Discharge status: Home Health | Payer: Medicare (Managed Care) | Attending: Internal Medicine | Admitting: Internal Medicine

## 2018-12-23 ENCOUNTER — Encounter: Payer: Self-pay | Admitting: Emergency Medicine

## 2018-12-23 DIAGNOSIS — Z87891 Personal history of nicotine dependence: Secondary | ICD-10-CM

## 2018-12-23 DIAGNOSIS — I4891 Unspecified atrial fibrillation: Secondary | ICD-10-CM

## 2018-12-23 DIAGNOSIS — I2721 Secondary pulmonary arterial hypertension: Secondary | ICD-10-CM | POA: Diagnosis present

## 2018-12-23 DIAGNOSIS — I4819 Other persistent atrial fibrillation: Secondary | ICD-10-CM | POA: Diagnosis present

## 2018-12-23 DIAGNOSIS — Z7189 Other specified counseling: Secondary | ICD-10-CM

## 2018-12-23 DIAGNOSIS — J9600 Acute respiratory failure, unspecified whether with hypoxia or hypercapnia: Secondary | ICD-10-CM

## 2018-12-23 DIAGNOSIS — I272 Pulmonary hypertension, unspecified: Secondary | ICD-10-CM | POA: Diagnosis not present

## 2018-12-23 DIAGNOSIS — I2510 Atherosclerotic heart disease of native coronary artery without angina pectoris: Secondary | ICD-10-CM | POA: Diagnosis present

## 2018-12-23 DIAGNOSIS — Z833 Family history of diabetes mellitus: Secondary | ICD-10-CM

## 2018-12-23 DIAGNOSIS — E870 Hyperosmolality and hypernatremia: Secondary | ICD-10-CM | POA: Diagnosis present

## 2018-12-23 DIAGNOSIS — F418 Other specified anxiety disorders: Secondary | ICD-10-CM | POA: Diagnosis present

## 2018-12-23 DIAGNOSIS — J9622 Acute and chronic respiratory failure with hypercapnia: Secondary | ICD-10-CM | POA: Diagnosis present

## 2018-12-23 DIAGNOSIS — J440 Chronic obstructive pulmonary disease with acute lower respiratory infection: Principal | ICD-10-CM | POA: Diagnosis present

## 2018-12-23 DIAGNOSIS — J9621 Acute and chronic respiratory failure with hypoxia: Secondary | ICD-10-CM | POA: Diagnosis present

## 2018-12-23 DIAGNOSIS — Z66 Do not resuscitate: Secondary | ICD-10-CM | POA: Diagnosis not present

## 2018-12-23 DIAGNOSIS — J441 Chronic obstructive pulmonary disease with (acute) exacerbation: Secondary | ICD-10-CM | POA: Diagnosis present

## 2018-12-23 DIAGNOSIS — R0600 Dyspnea, unspecified: Secondary | ICD-10-CM

## 2018-12-23 DIAGNOSIS — N179 Acute kidney failure, unspecified: Secondary | ICD-10-CM | POA: Diagnosis present

## 2018-12-23 DIAGNOSIS — Z7951 Long term (current) use of inhaled steroids: Secondary | ICD-10-CM

## 2018-12-23 DIAGNOSIS — J9611 Chronic respiratory failure with hypoxia: Secondary | ICD-10-CM | POA: Diagnosis not present

## 2018-12-23 DIAGNOSIS — Z20828 Contact with and (suspected) exposure to other viral communicable diseases: Secondary | ICD-10-CM | POA: Diagnosis present

## 2018-12-23 DIAGNOSIS — J9811 Atelectasis: Secondary | ICD-10-CM | POA: Diagnosis present

## 2018-12-23 DIAGNOSIS — I2781 Cor pulmonale (chronic): Secondary | ICD-10-CM | POA: Diagnosis present

## 2018-12-23 DIAGNOSIS — R0602 Shortness of breath: Secondary | ICD-10-CM | POA: Diagnosis not present

## 2018-12-23 DIAGNOSIS — I110 Hypertensive heart disease with heart failure: Secondary | ICD-10-CM | POA: Diagnosis present

## 2018-12-23 DIAGNOSIS — E7800 Pure hypercholesterolemia, unspecified: Secondary | ICD-10-CM | POA: Diagnosis present

## 2018-12-23 DIAGNOSIS — I700 Atherosclerosis of aorta: Secondary | ICD-10-CM | POA: Diagnosis present

## 2018-12-23 DIAGNOSIS — E860 Dehydration: Secondary | ICD-10-CM | POA: Diagnosis present

## 2018-12-23 DIAGNOSIS — E1165 Type 2 diabetes mellitus with hyperglycemia: Secondary | ICD-10-CM | POA: Diagnosis present

## 2018-12-23 DIAGNOSIS — E876 Hypokalemia: Secondary | ICD-10-CM | POA: Diagnosis present

## 2018-12-23 DIAGNOSIS — I480 Paroxysmal atrial fibrillation: Secondary | ICD-10-CM | POA: Diagnosis not present

## 2018-12-23 DIAGNOSIS — G9341 Metabolic encephalopathy: Secondary | ICD-10-CM | POA: Diagnosis present

## 2018-12-23 DIAGNOSIS — E039 Hypothyroidism, unspecified: Secondary | ICD-10-CM | POA: Diagnosis present

## 2018-12-23 DIAGNOSIS — J209 Acute bronchitis, unspecified: Secondary | ICD-10-CM

## 2018-12-23 DIAGNOSIS — F419 Anxiety disorder, unspecified: Secondary | ICD-10-CM

## 2018-12-23 DIAGNOSIS — Z6837 Body mass index (BMI) 37.0-37.9, adult: Secondary | ICD-10-CM

## 2018-12-23 DIAGNOSIS — K8020 Calculus of gallbladder without cholecystitis without obstruction: Secondary | ICD-10-CM | POA: Diagnosis present

## 2018-12-23 DIAGNOSIS — F0390 Unspecified dementia without behavioral disturbance: Secondary | ICD-10-CM | POA: Diagnosis present

## 2018-12-23 DIAGNOSIS — Z79890 Hormone replacement therapy (postmenopausal): Secondary | ICD-10-CM

## 2018-12-23 DIAGNOSIS — K219 Gastro-esophageal reflux disease without esophagitis: Secondary | ICD-10-CM | POA: Diagnosis present

## 2018-12-23 DIAGNOSIS — K449 Diaphragmatic hernia without obstruction or gangrene: Secondary | ICD-10-CM | POA: Diagnosis present

## 2018-12-23 DIAGNOSIS — Z515 Encounter for palliative care: Secondary | ICD-10-CM

## 2018-12-23 DIAGNOSIS — I10 Essential (primary) hypertension: Secondary | ICD-10-CM | POA: Diagnosis not present

## 2018-12-23 DIAGNOSIS — J189 Pneumonia, unspecified organism: Secondary | ICD-10-CM | POA: Diagnosis not present

## 2018-12-23 DIAGNOSIS — R410 Disorientation, unspecified: Secondary | ICD-10-CM | POA: Diagnosis not present

## 2018-12-23 DIAGNOSIS — Z9981 Dependence on supplemental oxygen: Secondary | ICD-10-CM

## 2018-12-23 DIAGNOSIS — F329 Major depressive disorder, single episode, unspecified: Secondary | ICD-10-CM

## 2018-12-23 DIAGNOSIS — Z7901 Long term (current) use of anticoagulants: Secondary | ICD-10-CM

## 2018-12-23 DIAGNOSIS — Z8673 Personal history of transient ischemic attack (TIA), and cerebral infarction without residual deficits: Secondary | ICD-10-CM

## 2018-12-23 DIAGNOSIS — J9620 Acute and chronic respiratory failure, unspecified whether with hypoxia or hypercapnia: Secondary | ICD-10-CM | POA: Diagnosis not present

## 2018-12-23 DIAGNOSIS — E785 Hyperlipidemia, unspecified: Secondary | ICD-10-CM | POA: Diagnosis present

## 2018-12-23 DIAGNOSIS — M47812 Spondylosis without myelopathy or radiculopathy, cervical region: Secondary | ICD-10-CM | POA: Diagnosis present

## 2018-12-23 DIAGNOSIS — I5032 Chronic diastolic (congestive) heart failure: Secondary | ICD-10-CM | POA: Diagnosis present

## 2018-12-23 DIAGNOSIS — Z79899 Other long term (current) drug therapy: Secondary | ICD-10-CM

## 2018-12-23 LAB — FIBRIN DERIVATIVES D-DIMER (ARMC ONLY): Fibrin derivatives D-dimer (ARMC): 1922.75 ng/mL (FEU) — ABNORMAL HIGH (ref 0.00–499.00)

## 2018-12-23 LAB — BRAIN NATRIURETIC PEPTIDE: B Natriuretic Peptide: 269 pg/mL — ABNORMAL HIGH (ref 0.0–100.0)

## 2018-12-23 LAB — CBC
HCT: 49.6 % — ABNORMAL HIGH (ref 36.0–46.0)
Hemoglobin: 15.8 g/dL — ABNORMAL HIGH (ref 12.0–15.0)
MCH: 30.8 pg (ref 26.0–34.0)
MCHC: 31.9 g/dL (ref 30.0–36.0)
MCV: 96.7 fL (ref 80.0–100.0)
Platelets: 219 10*3/uL (ref 150–400)
RBC: 5.13 MIL/uL — ABNORMAL HIGH (ref 3.87–5.11)
RDW: 14.6 % (ref 11.5–15.5)
WBC: 10.5 10*3/uL (ref 4.0–10.5)
nRBC: 0.2 % (ref 0.0–0.2)

## 2018-12-23 LAB — COMPREHENSIVE METABOLIC PANEL
ALT: 12 U/L (ref 0–44)
AST: 27 U/L (ref 15–41)
Albumin: 3.9 g/dL (ref 3.5–5.0)
Alkaline Phosphatase: 48 U/L (ref 38–126)
Anion gap: 13 (ref 5–15)
BUN: 13 mg/dL (ref 8–23)
CO2: 32 mmol/L (ref 22–32)
Calcium: 9.5 mg/dL (ref 8.9–10.3)
Chloride: 97 mmol/L — ABNORMAL LOW (ref 98–111)
Creatinine, Ser: 1.01 mg/dL — ABNORMAL HIGH (ref 0.44–1.00)
GFR calc Af Amer: >60 mL/min (ref >60)
GFR calc non Af Amer: 55 mL/min — ABNORMAL LOW (ref >60)
Glucose, Bld: 97 mg/dL (ref 70–99)
Potassium: 3.7 mmol/L (ref 3.5–5.1)
Sodium: 142 mmol/L (ref 135–145)
Total Bilirubin: 1.5 mg/dL — ABNORMAL HIGH (ref 0.3–1.2)
Total Protein: 7.4 g/dL (ref 6.5–8.1)

## 2018-12-23 LAB — TROPONIN I (HIGH SENSITIVITY): Troponin I (High Sensitivity): 10 ng/L (ref <18)

## 2018-12-23 MED ORDER — GUAIFENESIN-CODEINE 100-10 MG/5ML PO SOLN: 5.0000 mL | ORAL | Status: DC | PRN

## 2018-12-23 MED ORDER — SODIUM CHLORIDE 0.9 % IV SOLN
1.0000 g | INTRAVENOUS | Status: AC
  Administered 2018-12-24 – 2018-12-27 (×5): 1 g via INTRAVENOUS
  Filled 2018-12-23 (×3): qty 1
  Filled 2018-12-23: qty 10
  Filled 2018-12-23: qty 1

## 2018-12-23 MED ORDER — POLYVINYL ALCOHOL 1.4 % OP SOLN
OPHTHALMIC | Status: DC | PRN
  Filled 2018-12-23: qty 15

## 2018-12-23 MED ORDER — FAMOTIDINE 20 MG PO TABS
20.0000 mg | ORAL_TABLET | Freq: Two times a day (BID) | ORAL | Status: DC
  Filled 2018-12-23: qty 1

## 2018-12-23 MED ORDER — IPRATROPIUM-ALBUTEROL 0.5-2.5 (3) MG/3ML IN SOLN
3.0000 mL | Freq: Once | RESPIRATORY_TRACT | Status: AC
  Administered 2018-12-23: 3 mL via RESPIRATORY_TRACT
  Filled 2018-12-23: qty 3

## 2018-12-23 MED ORDER — AZITHROMYCIN 500 MG PO TABS: 500.0000 mg | ORAL_TABLET | Freq: Every day | ORAL | Status: DC

## 2018-12-23 MED ORDER — ONDANSETRON HCL 4 MG PO TABS: 4.0000 mg | ORAL_TABLET | Freq: Four times a day (QID) | ORAL | Status: DC | PRN

## 2018-12-23 MED ORDER — SODIUM CHLORIDE 0.9 % IV SOLN
INTRAVENOUS | Status: DC
  Administered 2018-12-24: 02:00:00 via INTRAVENOUS

## 2018-12-23 MED ORDER — SALINE NASAL SPRAY 0.65 % NA SOLN: 2.0000 | NASAL | Status: DC | PRN

## 2018-12-23 MED ORDER — POTASSIUM CHLORIDE CRYS ER 20 MEQ PO TBCR
40.0000 meq | EXTENDED_RELEASE_TABLET | Freq: Two times a day (BID) | ORAL | Status: DC
  Administered 2018-12-24 – 2018-12-26 (×5): 40 meq via ORAL
  Filled 2018-12-23 (×6): qty 2

## 2018-12-23 MED ORDER — DILTIAZEM HCL 100 MG IV SOLR
5.0000 mg/h | INTRAVENOUS | Status: DC
  Administered 2018-12-24: 5 mg/h via INTRAVENOUS
  Administered 2018-12-24: 15 mg/h via INTRAVENOUS
  Administered 2018-12-26 – 2018-12-27 (×2): 5 mg/h via INTRAVENOUS
  Filled 2018-12-23 (×5): qty 100

## 2018-12-23 MED ORDER — ACETAMINOPHEN 650 MG RE SUPP: 650.0000 mg | Freq: Four times a day (QID) | RECTAL | Status: DC | PRN

## 2018-12-23 MED ORDER — TIOTROPIUM BROMIDE MONOHYDRATE 18 MCG IN CAPS
1.0000 | ORAL_CAPSULE | Freq: Every day | RESPIRATORY_TRACT | Status: DC
  Administered 2018-12-24 – 2018-12-28 (×5): 18 ug via RESPIRATORY_TRACT
  Filled 2018-12-23 (×2): qty 5

## 2018-12-23 MED ORDER — ACETAMINOPHEN 325 MG PO TABS
650.0000 mg | ORAL_TABLET | Freq: Four times a day (QID) | ORAL | Status: DC | PRN
  Administered 2018-12-28: 650 mg via ORAL
  Filled 2018-12-23 (×2): qty 2

## 2018-12-23 MED ORDER — TRAZODONE HCL 50 MG PO TABS
25.0000 mg | ORAL_TABLET | Freq: Every evening | ORAL | Status: DC | PRN
  Administered 2018-12-27: 25 mg via ORAL
  Filled 2018-12-23 (×2): qty 1

## 2018-12-23 MED ORDER — ENOXAPARIN SODIUM 40 MG/0.4ML ~~LOC~~ SOLN: 40.0000 mg | SUBCUTANEOUS | Status: DC

## 2018-12-23 MED ORDER — PREDNISONE 20 MG PO TABS
40.0000 mg | ORAL_TABLET | Freq: Every day | ORAL | Status: DC
  Administered 2018-12-25 – 2018-12-26 (×2): 40 mg via ORAL
  Filled 2018-12-23 (×2): qty 2

## 2018-12-23 MED ORDER — PRAVASTATIN SODIUM 20 MG PO TABS
20.0000 mg | ORAL_TABLET | Freq: Every day | ORAL | Status: DC
  Administered 2018-12-24 – 2019-01-02 (×6): 20 mg via ORAL
  Filled 2018-12-23 (×7): qty 1

## 2018-12-23 MED ORDER — CALCIUM CARBONATE-VITAMIN D 500-200 MG-UNIT PO TABS
1.0000 | ORAL_TABLET | Freq: Two times a day (BID) | ORAL | Status: DC
  Administered 2018-12-24 – 2019-01-03 (×15): 1 via ORAL
  Filled 2018-12-23 (×17): qty 1

## 2018-12-23 MED ORDER — BISOPROLOL FUMARATE 5 MG PO TABS
2.5000 mg | ORAL_TABLET | Freq: Two times a day (BID) | ORAL | Status: DC
  Administered 2018-12-24 – 2018-12-27 (×7): 2.5 mg via ORAL
  Filled 2018-12-23 (×10): qty 0.5

## 2018-12-23 MED ORDER — ONDANSETRON HCL 4 MG/2ML IJ SOLN: 4.0000 mg | Freq: Four times a day (QID) | INTRAMUSCULAR | Status: DC | PRN

## 2018-12-23 MED ORDER — ALBUTEROL SULFATE (2.5 MG/3ML) 0.083% IN NEBU
3.0000 mL | INHALATION_SOLUTION | RESPIRATORY_TRACT | Status: DC | PRN
  Administered 2018-12-26 (×2): 3 mL via RESPIRATORY_TRACT
  Filled 2018-12-23 (×2): qty 3

## 2018-12-23 MED ORDER — FUROSEMIDE 40 MG PO TABS
20.0000 mg | ORAL_TABLET | Freq: Two times a day (BID) | ORAL | Status: DC
  Administered 2018-12-24: 20 mg via ORAL
  Filled 2018-12-23: qty 1

## 2018-12-23 MED ORDER — LEVOTHYROXINE SODIUM 50 MCG PO TABS
150.0000 ug | ORAL_TABLET | Freq: Every day | ORAL | Status: DC
  Administered 2018-12-24 – 2018-12-28 (×5): 150 ug via ORAL
  Filled 2018-12-23 (×2): qty 1
  Filled 2018-12-23: qty 3
  Filled 2018-12-23 (×2): qty 1

## 2018-12-23 MED ORDER — GUAIFENESIN ER 600 MG PO TB12
600.0000 mg | ORAL_TABLET | Freq: Two times a day (BID) | ORAL | Status: DC
  Administered 2018-12-24 – 2019-01-03 (×15): 600 mg via ORAL
  Filled 2018-12-23 (×16): qty 1

## 2018-12-23 MED ORDER — METHYLPREDNISOLONE SODIUM SUCC 125 MG IJ SOLR
60.0000 mg | Freq: Three times a day (TID) | INTRAMUSCULAR | Status: AC
  Administered 2018-12-24 (×2): 60 mg via INTRAVENOUS
  Filled 2018-12-23 (×2): qty 2

## 2018-12-23 MED ORDER — MAGNESIUM HYDROXIDE 400 MG/5ML PO SUSP: 30.0000 mL | Freq: Every day | ORAL | Status: DC | PRN

## 2018-12-23 MED ORDER — APIXABAN 5 MG PO TABS
5.0000 mg | ORAL_TABLET | Freq: Two times a day (BID) | ORAL | Status: DC
  Administered 2018-12-24 – 2018-12-28 (×9): 5 mg via ORAL
  Filled 2018-12-23 (×10): qty 1

## 2018-12-23 MED ORDER — IOHEXOL 350 MG/ML SOLN
75.0000 mL | Freq: Once | INTRAVENOUS | Status: AC | PRN
  Administered 2018-12-23: 75 mL via INTRAVENOUS

## 2018-12-23 MED ORDER — CITALOPRAM HYDROBROMIDE 20 MG PO TABS: 40.0000 mg | ORAL_TABLET | Freq: Every day | ORAL | Status: DC

## 2018-12-23 MED ORDER — METHYLPREDNISOLONE SODIUM SUCC 125 MG IJ SOLR
125.0000 mg | Freq: Once | INTRAMUSCULAR | Status: AC
  Administered 2018-12-23: 125 mg via INTRAVENOUS
  Filled 2018-12-23: qty 2

## 2018-12-23 MED ORDER — IPRATROPIUM-ALBUTEROL 0.5-2.5 (3) MG/3ML IN SOLN
3.0000 mL | Freq: Four times a day (QID) | RESPIRATORY_TRACT | Status: DC | PRN
  Administered 2018-12-24 – 2018-12-27 (×4): 3 mL via RESPIRATORY_TRACT
  Filled 2018-12-23 (×4): qty 3

## 2018-12-23 MED ORDER — QUETIAPINE FUMARATE 25 MG PO TABS: 25.0000 mg | ORAL_TABLET | Freq: Every day | ORAL | Status: DC

## 2018-12-23 MED ORDER — DILTIAZEM HCL 25 MG/5ML IV SOLN
10.0000 mg | Freq: Once | INTRAVENOUS | Status: AC
  Administered 2018-12-23: 10 mg via INTRAVENOUS
  Filled 2018-12-23: qty 5
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
## 2018-12-23 NOTE — ED Notes (Signed)
Applied purewick external catheter. Pt tolerated well

## 2018-12-23 NOTE — ED Notes (Signed)
Patient c/o weakness today. Patient had increased dyspnea with exertion. Patient able to speak in complete sentences when at rest.

## 2018-12-23 NOTE — ED Triage Notes (Signed)
Pt reports has COPD and has been increased SOB and weak. Pt reports saw [facility] yesterday and still felt worse today.

## 2018-12-23 NOTE — ED Notes (Addendum)
Patient is going to CT scan at this time. Patient won't keep blood pressure cuff on at this time.

## 2018-12-23 NOTE — ED Notes (Signed)
Dr. Cinda Quest informed that patient desat to 86% on 3L O2 via [place] while standing with walker and heart rate increased to 135, RR 30.

## 2018-12-23 NOTE — ED Notes (Signed)
Patient states she feels like she can breathe better after Duoneb treatment.

## 2018-12-23 NOTE — ED Notes (Signed)
Report given to Coral Ceo RN

## 2018-12-24 ENCOUNTER — Other Ambulatory Visit: Payer: Self-pay

## 2018-12-24 ENCOUNTER — Inpatient Hospital Stay: Payer: Medicare (Managed Care)

## 2018-12-24 LAB — BLOOD GAS, ARTERIAL
Acid-Base Excess: 6.9 mmol/L — ABNORMAL HIGH (ref 0.0–2.0)
Bicarbonate: 32 mmol/L — ABNORMAL HIGH (ref 20.0–28.0)
FIO2: 32
O2 Saturation: 98.5 %
Patient temperature: 37
pCO2 arterial: 46 mmHg (ref 32.0–48.0)
pH, Arterial: 7.45 (ref 7.350–7.450)
pO2, Arterial: 111 mmHg — ABNORMAL HIGH (ref 83.0–108.0)

## 2018-12-24 LAB — CBC
HCT: 46.4 % — ABNORMAL HIGH (ref 36.0–46.0)
Hemoglobin: 14.6 g/dL (ref 12.0–15.0)
MCH: 30.9 pg (ref 26.0–34.0)
MCHC: 31.5 g/dL (ref 30.0–36.0)
MCV: 98.1 fL (ref 80.0–100.0)
Platelets: 240 10*3/uL (ref 150–400)
RBC: 4.73 MIL/uL (ref 3.87–5.11)
RDW: 14.4 % (ref 11.5–15.5)
WBC: 9.9 10*3/uL (ref 4.0–10.5)
nRBC: 0 % (ref 0.0–0.2)

## 2018-12-24 LAB — BLOOD CULTURE ID PANEL (REFLEXED)

## 2018-12-24 LAB — BASIC METABOLIC PANEL
Anion gap: 14 (ref 5–15)
BUN: 15 mg/dL (ref 8–23)
CO2: 26 mmol/L (ref 22–32)
Calcium: 8.6 mg/dL — ABNORMAL LOW (ref 8.9–10.3)
Chloride: 100 mmol/L (ref 98–111)
Creatinine, Ser: 0.92 mg/dL (ref 0.44–1.00)
GFR calc Af Amer: >60 mL/min (ref >60)
GFR calc non Af Amer: >60 mL/min (ref >60)
Glucose, Bld: 195 mg/dL — ABNORMAL HIGH (ref 70–99)
Potassium: 3.5 mmol/L (ref 3.5–5.1)
Sodium: 140 mmol/L (ref 135–145)

## 2018-12-24 LAB — SARS CORONAVIRUS 2 (TAT 6-24 HRS): SARS Coronavirus 2: NEGATIVE

## 2018-12-24 LAB — TROPONIN I (HIGH SENSITIVITY): Troponin I (High Sensitivity): 8 ng/L (ref <18)

## 2018-12-24 LAB — TSH: TSH: 4.476 u[IU]/mL (ref 0.350–4.500)

## 2018-12-24 LAB — GLUCOSE, CAPILLARY: Glucose-Capillary: 202 mg/dL — ABNORMAL HIGH (ref 70–99)

## 2018-12-24 MED ORDER — SODIUM CHLORIDE 0.9 % IV SOLN
INTRAVENOUS | Status: DC | PRN
  Administered 2018-12-24 – 2018-12-27 (×3): 250 mL via INTRAVENOUS

## 2018-12-24 MED ORDER — ORAL CARE MOUTH RINSE
15.0000 mL | Freq: Two times a day (BID) | OROMUCOSAL | Status: DC
  Administered 2018-12-24 – 2019-01-03 (×17): 15 mL via OROMUCOSAL

## 2018-12-24 MED ORDER — DILTIAZEM HCL ER COATED BEADS 120 MG PO CP24
240.0000 mg | ORAL_CAPSULE | Freq: Every day | ORAL | Status: DC
  Administered 2018-12-24 – 2018-12-26 (×3): 240 mg via ORAL
  Filled 2018-12-24: qty 1
  Filled 2018-12-24 (×2): qty 2

## 2018-12-24 MED ORDER — FAMOTIDINE 20 MG PO TABS
20.0000 mg | ORAL_TABLET | Freq: Two times a day (BID) | ORAL | Status: DC
  Administered 2018-12-24 – 2018-12-28 (×9): 20 mg via ORAL
  Filled 2018-12-24 (×9): qty 1

## 2018-12-24 MED ORDER — VANCOMYCIN HCL 10 G IV SOLR
2000.0000 mg | Freq: Once | INTRAVENOUS | Status: AC
  Administered 2018-12-24: 2000 mg via INTRAVENOUS
  Filled 2018-12-24: qty 2000

## 2018-12-24 MED ORDER — VANCOMYCIN HCL IN DEXTROSE 1-5 GM/200ML-% IV SOLN
1000.0000 mg | INTRAVENOUS | Status: DC
  Filled 2018-12-24: qty 200

## 2018-12-24 MED ORDER — DOXYCYCLINE HYCLATE 100 MG PO TABS
100.0000 mg | ORAL_TABLET | Freq: Two times a day (BID) | ORAL | Status: DC
  Filled 2018-12-24: qty 1

## 2018-12-24 MED ORDER — FUROSEMIDE 10 MG/ML IJ SOLN
40.0000 mg | Freq: Two times a day (BID) | INTRAMUSCULAR | Status: DC
  Administered 2018-12-24 – 2018-12-25 (×4): 40 mg via INTRAVENOUS
  Filled 2018-12-24 (×5): qty 4

## 2018-12-24 MED ORDER — QUETIAPINE FUMARATE 25 MG PO TABS
25.0000 mg | ORAL_TABLET | Freq: Every day | ORAL | Status: DC
  Administered 2018-12-24 – 2018-12-26 (×3): 25 mg via ORAL
  Filled 2018-12-24 (×3): qty 1

## 2018-12-24 MED ORDER — DOXYCYCLINE HYCLATE 100 MG PO TABS
100.0000 mg | ORAL_TABLET | Freq: Two times a day (BID) | ORAL | Status: DC
  Administered 2018-12-24: 100 mg via ORAL
  Filled 2018-12-24 (×2): qty 1

## 2018-12-24 MED ORDER — LORAZEPAM 0.5 MG PO TABS
0.5000 mg | ORAL_TABLET | Freq: Three times a day (TID) | ORAL | Status: DC
  Administered 2018-12-24 – 2018-12-27 (×10): 0.5 mg via ORAL
  Filled 2018-12-24 (×10): qty 1

## 2018-12-24 MED ORDER — BUSPIRONE HCL 10 MG PO TABS
10.0000 mg | ORAL_TABLET | Freq: Two times a day (BID) | ORAL | Status: DC
  Administered 2018-12-24 – 2018-12-27 (×7): 10 mg via ORAL
  Filled 2018-12-24 (×2): qty 1
  Filled 2018-12-24: qty 2
  Filled 2018-12-24 (×5): qty 1

## 2018-12-24 NOTE — ED Notes (Signed)
Per dr dahal ok for one blood culture.

## 2018-12-24 NOTE — Progress Notes (Addendum)
PROGRESS NOTE  Summer Bradley  DOB: 14-Jan-1946  PCP: [facility] [id number]  DOA: 12/23/2018  LOS: 1 day   Brief narrative: Summer Bradley is a 73 y.o. female with PMH of HTN, HLD, A. fib on Eliquis, stroke, with mild residual weakness in the left side, aortic aneurysm repair surgery, COPD on 2 L oxygen at home, hypothyroidism, GERD who lives at home alone and ambulates with a walker.  [facility]. Patient presented to the ED on 12/23/2018 with 24 to 48 hours history of worsening dyspnea, dry cough, wheezing and generalized weakness.  No fever, no recent exposure to Covid patients. Reports compliance to her medications.  No orthopnea or PND or worsening pedal edema.  In the ED, patient was in A. fib with RVR up to 120s.  Oxygen saturation remained more than 95% on baseline 3 L via nasal cannula.  However with even walking to the bathroom, she became dyspneic and her oxygen saturation dropped.   Work-up showed normal BMP, normal CBC. COVID-19 test remains pending TSH normal.  CT angio of chest is negative for pulmonary embolism or any other acute pulmonary process.  Additionally it showed moderate sized hiatal hernia,, emphysema, cholelithiasis without evidence of cholecystitis.  Patient was started on IV Cardizem drip for A. fib with RVR and admitted under hospitalist service for further evaluation and management.  Subjective: Patient was seen and examined this morning.  Pleasant elderly Caucasian female.  Propped up in bed.  Not in distress at rest.  No new symptoms since overnight.  Assessment/Plan:  Acute exacerbation of COPD  -Likely secondary to bronchitis.  No infiltrate and CT scan of chest.   -Started on IV Solu-Medrol, bronchodilators, Mucinex as alert empiric treatment with IV Rocephin and doxycycline.  -Sputum culture ordered.  -Patient seems to be on prednisone 40 mg daily at home.  I am not sure how long was he taking it  for. I wonder if it was causing fluid retention leading to progressive dyspnea.  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  Remains on the same flow at rest.  However on ambulation, patient gets significant symptoms and requires higher flow. -Continue to monitor oxygen requirement and wean down as tolerated. -No known history of CHF.  Takes Lasix 20 million daily at home. -Echocardiogram ordered to look for LV function. -Stop IV fluid.  Start on Lasix 40 mg IV twice daily.  Atrial fibrillation with rapid ventricular response -Patient has history of A. fib and is on Cardizem and bisoprolol at home.  Reports compliance to medications.  TSH normal. -Presented with A. fib with RVR.  Started on Cardizem drip which she currently remains on.  Home medicines resumed.  Wean down Cardizem drip as tolerated to keep the heart rate under 110 bpm. -Continue Eliquis for anticoagulation.  Hypertension -Home meds include Cardizem, bisoprolol, Lasix.  Continue the same.  Hypothyroidism -TSH normal.  Continue Synthroid.  History of stroke -Several years ago.  Reports mild residual weakness on the left side.  Continue Eliquis and lovastatin.  Depression -Resume Celexa and Seroquel.  Mobility: PT eval ordered. DVT prophylaxis:  Eliquis resumed Code Status:   Code Status: Full Code  Family Communication:  Expected Discharge:  Anticipate at least 2 to 3 days of hospital stay  Consultants:  None  Procedures:  None  Antimicrobials: Anti-infectives (From admission, onward)   Start     Dose/Rate  Route Frequency Ordered Stop   12/24/18 1000  azithromycin (ZITHROMAX) tablet 500 mg  Status:  Discontinued     500 mg Oral Daily 12/23/18 2233 12/24/18 0056   12/24/18 1000  doxycycline (VIBRA-TABS) tablet 100 mg     100 mg Oral Every 12 hours 12/24/18 0159     12/24/18 0100  doxycycline (VIBRA-TABS) tablet 100 mg  Status:  Discontinued     100  mg Oral Every 12 hours 12/24/18 0056 12/24/18 0159   12/23/18 2245  cefTRIAXone (ROCEPHIN) 1 g in sodium chloride 0.9 % 100 mL IVPB     1 g 200 mL/hr over 30 Minutes Intravenous Every 24 hours 12/23/18 2233 12/28/18 2244      Diet Order            Diet Heart Room service appropriate? Yes; Fluid consistency: Thin  Diet effective now              Infusions:  . sodium chloride 75 mL/hr at 12/24/18 0146  . cefTRIAXone (ROCEPHIN)  IV Stopped (12/24/18 0700)  . diltiazem (CARDIZEM) infusion 12.5 mg/hr (12/24/18 0921)    Scheduled Meds: . apixaban  5 mg Oral BID  . bisoprolol  2.5 mg Oral BID  . calcium-vitamin D  1 tablet Oral BID  . diltiazem  240 mg Oral Daily  . doxycycline  100 mg Oral Q12H  . famotidine  20 mg Oral BID  . furosemide  20 mg Oral BID  . guaiFENesin  600 mg Oral BID  . levothyroxine  150 mcg Oral QAC breakfast  . methylPREDNISolone (SOLU-MEDROL) injection  60 mg Intravenous Q8H   Followed by  . [START ON 12/25/2018] predniSONE  40 mg Oral Q breakfast  . potassium chloride SA  40 mEq Oral BID  . pravastatin  20 mg Oral q1800  . QUEtiapine  25 mg Oral QHS  . tiotropium  1 capsule Inhalation Daily    PRN meds: acetaminophen **OR** acetaminophen, albuterol, guaiFENesin-codeine, ipratropium-albuterol, magnesium hydroxide, polyvinyl alcohol, traZODone   Objective: Vitals:   12/24/18 0756 12/24/18 0900  BP: (!) 137/96 (!) 143/89  Pulse: (!) 42 (!) 133  Resp: (!) 28 (!) 28  Temp: 98.2 F (36.8 C)   SpO2: 96% 95%    Intake/Output Summary (Last 24 hours) at 12/24/2018 1007 Last data filed at 12/24/2018 0619 Gross per 24 hour  Intake -  Output 1 ml  Net -1 ml   Filed Weights   12/23/18 1316  Weight: 90.7 kg   Weight change:  Body mass index is 36.58 kg/m.   Physical Exam: General exam: Appears calm and comfortable.  Propped up in bed. Skin: No rashes, lesions or ulcers. HEENT: Atraumatic, normocephalic, supple neck, no obvious bleeding Lungs:  Diminished air entry in both bases, no crackles, mild scattered end expiratory wheezing. CVS: Irregular rhythm, tachycardic, no murmur GI/Abd soft, nontender, nondistended, bowel sound present CNS: Alert, awake, oriented x3 Psychiatry: Mood appropriate Extremities: Trace bilateral pedal edema  Data Review: I have personally reviewed the laboratory data and studies available.  Recent Labs  Lab 12/23/18 1340 12/24/18 0628  WBC 10.5 9.9  HGB 15.8* 14.6  HCT 49.6* 46.4*  MCV 96.7 98.1  PLT 219 240   Recent Labs  Lab 12/23/18 1645 12/24/18 0628  NA 142 140  K 3.7 3.5  CL 97* 100  CO2 32 26  GLUCOSE 97 195*  BUN 13 15  CREATININE 1.01* 0.92  CALCIUM 9.5 8.6*    Terrilee Croak, MD  [facility] 12/24/2018

## 2018-12-24 NOTE — ED Notes (Signed)
Pt placed on hospital bed. Family at bedside. purwick in place.

## 2018-12-24 NOTE — Progress Notes (Signed)
PT Cancellation Note  Patient Details Name: Summer Bradley MRN: [id number] DOB: 11-03-1945   Cancelled Treatment:    Reason Eval/Treat Not Completed: Patient not medically ready.  PT consult received.  Chart reviewed.  Per chart review, pt started on cardizem gtt early this morning and given PO cardizem this morning d/t elevated resting HR.  Will hold PT evaluation at this time and re-attempt PT session at a later date/time once pt is medically appropriate and HR has stabilized over 12-24 hours before performing exertional activity.  Leitha Bleak, PT 12/24/18, 12:34 PM [phone]

## 2018-12-24 NOTE — Progress Notes (Signed)
OT Cancellation Note  Patient Details Name: Aniza Borsa MRN: [id number] DOB: Sep 22, 1945   Cancelled Treatment:    Reason Eval/Treat Not Completed: Medical issues which prohibited therapy. Consult received, chart reviewed. Pt remains on cardizem gtt, also given PO cardizem with recent resting HR noted to be 133bpm. Will hold OT evaluation at this time and re-attempt at later date/time as pt is medically appropriate and HR has stabilized over 12-24 hours prior to initiation of exertional activity.    Jeni Salles, MPH, MS, OTR/L ascom [phone] 12/24/18, 9:43 AM

## 2018-12-24 NOTE — ED Notes (Signed)
Family remains at bedside. NAD.  Anxiety meds given. Waiting on lunch tray.  No needs.

## 2018-12-24 NOTE — ED Notes (Signed)
Pt is alert and oriented.  NAD. VSS. Remains in diltiazem gtt.  Paged admitting MD to inquire about PO diltiazem or other HR control/antidysrhymic drug.  Patient given water.

## 2018-12-24 NOTE — Progress Notes (Signed)
PHARMACY - PHYSICIAN COMMUNICATION CRITICAL VALUE ALERT - BLOOD CULTURE IDENTIFICATION (BCID)  Results for orders placed or performed during the hospital encounter of 12/23/18  Blood Culture ID Panel (Reflexed) (Collected: 12/24/2018  1:42 AM)  Result Value Ref Range   Enterococcus species NOT DETECTED NOT DETECTED   Listeria monocytogenes NOT DETECTED NOT DETECTED   Staphylococcus species DETECTED (A) NOT DETECTED   Staphylococcus aureus (BCID) NOT DETECTED NOT DETECTED   Methicillin resistance DETECTED (A) NOT DETECTED   Streptococcus species NOT DETECTED NOT DETECTED   Streptococcus agalactiae NOT DETECTED NOT DETECTED   Streptococcus pneumoniae NOT DETECTED NOT DETECTED   Streptococcus pyogenes NOT DETECTED NOT DETECTED   Acinetobacter baumannii NOT DETECTED NOT DETECTED   Enterobacteriaceae species NOT DETECTED NOT DETECTED   Enterobacter cloacae complex NOT DETECTED NOT DETECTED   Escherichia coli NOT DETECTED NOT DETECTED   Klebsiella oxytoca NOT DETECTED NOT DETECTED   Klebsiella pneumoniae NOT DETECTED NOT DETECTED   Proteus species NOT DETECTED NOT DETECTED   Serratia marcescens NOT DETECTED NOT DETECTED   Haemophilus influenzae NOT DETECTED NOT DETECTED   Neisseria meningitidis NOT DETECTED NOT DETECTED   Pseudomonas aeruginosa NOT DETECTED NOT DETECTED   Candida albicans NOT DETECTED NOT DETECTED   Candida glabrata NOT DETECTED NOT DETECTED   Candida krusei NOT DETECTED NOT DETECTED   Candida parapsilosis NOT DETECTED NOT DETECTED   Candida tropicalis NOT DETECTED NOT DETECTED    Name of physician (or Provider) Contacted: Eugenie Norrie   Changes to prescribed antibiotics required:  Yes, will d/c doxycycline and start Vancomycin.   Nance Mccombs D 12/24/2018  9:19 PM

## 2018-12-24 NOTE — Progress Notes (Signed)
Pharmacy Antibiotic Note  Summer Bradley is a 73 y.o. female admitted on 12/23/2018 with bacteremia.  Pharmacy has been consulted for Vancomycin dosing.  Plan: Vancomycin 2 gm IV X 1 ordered to be given on 11/7 @ 2200. Vancomycin 1 gm IV Q24H ordered to start on 11/8 @ 2200. No peak or trough currently ordered.   AUC = 512 Vanc trough = 13  Vd = 47.4  ke = 0.041 hr-1 T1/2 = 16.8 hrs  Height: 5\' 2"  (157.5 cm) Weight: 208 lb 12.4 oz (94.7 kg) IBW/kg (Calculated) : 50.1  Temp (24hrs), Avg:98.2 F (36.8 C), Min:98.2 F (36.8 C), Max:98.2 F (36.8 C)  Recent Labs  Lab 12/23/18 1340 12/23/18 1645 12/24/18 0628  WBC 10.5  --  9.9  CREATININE  --  1.01* 0.92    Estimated Creatinine Clearance: 57.5 mL/min (by C-G formula based on SCr of 0.92 mg/dL).    Allergies  Allergen Reactions  . Lisinopril Cough    Antimicrobials this admission:   >>    >>   Dose adjustments this admission:   Microbiology results:  BCx:   UCx:    Sputum:    MRSA PCR:   Thank you for allowing pharmacy to be a part of this patient's care.  Lochlyn Zullo D 12/24/2018 9:25 PM

## 2018-12-24 NOTE — ED Notes (Signed)
Admitting MD at bedside to see pt.

## 2018-12-24 NOTE — ED Notes (Signed)
Pt ate biscuit from breakfast.

## 2018-12-24 NOTE — ED Notes (Signed)
Pt reporting feels SHOB.  Felt SHOB all through today but expected to improve as UOP increased with lasix and HR came down.  Pt reports feels worse than this AM.  Appears to feel bad. Occasional pursed lip breathing but not labored overall.  Checked rectal temp and blood sugar.  Paged dr. Pietro Cassis to discuss. New orders received.

## 2018-12-25 ENCOUNTER — Inpatient Hospital Stay
Admit: 2018-12-25 | Discharge: 2018-12-25 | Disposition: A | Discharge status: Home / Self Care | Payer: Medicare (Managed Care) | Attending: Family Medicine | Admitting: Family Medicine

## 2018-12-25 LAB — TROPONIN I (HIGH SENSITIVITY): Troponin I (High Sensitivity): 9 ng/L (ref <18)

## 2018-12-25 NOTE — Progress Notes (Signed)
Physical Therapy Evaluation Patient Details Name: Summer Bradley MRN: [id number] DOB: 09-09-1945 Today's Date: 12/25/2018   History of Present Illness  Summer Bradley  is a 73 y.o. Caucasian female with a known history of multiple medical problems that will be mentioned below, including COPD, atrial fibrillation, hypertension and hypothyroidism, who presented to the emergency room with acute onset of worsening dyspnea with associated dry cough, wheezing and generalized weakness, since yesterday.  Clinical Impression  Patient agrees to PT eval. She performs bed mobility with min assist supine to sit and sit and supine. She is able to stand with RW and O2 saturation decreased to 88% . She is able to take a few steps to the recliner with RW.  She has 3/5 strength BLE . Patient will benefit from skilled PT to improve mobility and strength.      Follow Up Recommendations Home health PT    Equipment Recommendations  Rolling walker with 5" wheels    Recommendations for Other Services       Precautions / Restrictions Restrictions Weight Bearing Restrictions: No      Mobility  Bed Mobility Overal bed mobility: Needs Assistance Bed Mobility: Supine to Sit;Sit to Supine     Supine to sit: Min assist Sit to supine: Min assist   General bed mobility comments: VC for saftey  Transfers Overall transfer level: Needs assistance Equipment used: Rolling walker (2 wheeled)             General transfer comment: (Pateint needs VC for satey and O2 saturation levels)  Ambulation/Gait Ambulation/Gait assistance: Modified independent (Device/Increase time) Gait Distance (Feet): 5 Feet Assistive device: Rolling walker (2 wheeled)          Stairs            Wheelchair Mobility    Modified Rankin (Stroke Patients Only)       Balance Overall balance assessment: Needs assistance Sitting-balance support: Bilateral upper extremity supported Sitting balance-Leahy Scale: Good                                        Pertinent Vitals/Pain Pain Assessment: No/denies pain    Home Living Family/patient expects to be discharged to:: Private residence Living Arrangements: Children Available Help at Discharge: Family Type of Home: House Home Access: Stairs to enter Entrance Stairs-Rails: Right Entrance Stairs-Number of Steps: 2 Home Layout: One level        Prior Function Level of Independence: Independent with assistive device(s)               Hand Dominance        Extremity/Trunk Assessment   Upper Extremity Assessment Upper Extremity Assessment: Overall WFL for tasks assessed    Lower Extremity Assessment Lower Extremity Assessment: Overall WFL for tasks assessed       Communication   Communication: No difficulties  Cognition Arousal/Alertness: Awake/alert Behavior During Therapy: WFL for tasks assessed/performed Overall Cognitive Status: Within Functional Limits for tasks assessed                                        General Comments      Exercises     Assessment/Plan    PT Assessment Patient needs continued PT services  PT Problem List Decreased strength;Decreased activity tolerance;Decreased balance;Decreased mobility  PT Treatment Interventions Gait training;Therapeutic activities;Therapeutic exercise    PT Goals (Current goals can be found in the Care Plan section)  Acute Rehab PT Goals Patient Stated Goal: Patient wants to walk PT Goal Formulation: With patient Time For Goal Achievement: 01/08/19 Potential to Achieve Goals: Good    Frequency Min 2X/week   Barriers to discharge        Co-evaluation               AM-PAC PT "6 Clicks" Mobility  Outcome Measure Help needed turning from your back to your side while in a flat bed without using bedrails?: A Little Help needed moving from lying on your back to sitting on the side of a flat bed without using bedrails?: A  Little Help needed moving to and from a bed to a chair (including a wheelchair)?: A Little Help needed standing up from a chair using your arms (e.g., wheelchair or bedside chair)?: A Little Help needed to walk in hospital room?: A Little Help needed climbing 3-5 steps with a railing? : A Little 6 Click Score: 18    End of Session Equipment Utilized During Treatment: Gait belt Activity Tolerance: Patient tolerated treatment well Patient left: in chair;with call bell/phone within reach Nurse Communication: Mobility status PT Visit Diagnosis: Muscle weakness (generalized) (M62.81);Difficulty in walking, not elsewhere classified (R26.2)    Time: 1335-1400 PT Time Calculation (min) (ACUTE ONLY): 25 min   Charges:   PT Evaluation $PT Eval Low Complexity: 1 Low PT Treatments $Therapeutic Activity: 8-22 mins          Alanson Puls, PT DPT 12/25/2018, 3:52 PM

## 2018-12-25 NOTE — Progress Notes (Signed)
PHARMACY - PHYSICIAN COMMUNICATION CRITICAL VALUE ALERT - BLOOD CULTURE IDENTIFICATION (BCID)  Summer Bradley is an 73 y.o. female who presented to [facility] on 12/23/2018 with a chief complaint of SOB, weakness   Assessment:  Tachycardic, CXR bilateral airspace opacities, was already found to be growing 1/4 GPC staph species Mec A +, now another 1/4 (2/4 total) GPR no BCID being sent out  Name of physician (or Provider) Contacted: Eugenie Norrie  Current antibiotics: vanc, ceftriaxone  Changes to prescribed antibiotics recommended:  Patient is on recommended antibiotics - No changes needed  Results for orders placed or performed during the hospital encounter of 12/23/18  Blood Culture ID Panel (Reflexed) (Collected: 12/24/2018  1:42 AM)  Result Value Ref Range   Enterococcus species NOT DETECTED NOT DETECTED   Listeria monocytogenes NOT DETECTED NOT DETECTED   Staphylococcus species DETECTED (A) NOT DETECTED   Staphylococcus aureus (BCID) NOT DETECTED NOT DETECTED   Methicillin resistance DETECTED (A) NOT DETECTED   Streptococcus species NOT DETECTED NOT DETECTED   Streptococcus agalactiae NOT DETECTED NOT DETECTED   Streptococcus pneumoniae NOT DETECTED NOT DETECTED   Streptococcus pyogenes NOT DETECTED NOT DETECTED   Acinetobacter baumannii NOT DETECTED NOT DETECTED   Enterobacteriaceae species NOT DETECTED NOT DETECTED   Enterobacter cloacae complex NOT DETECTED NOT DETECTED   Escherichia coli NOT DETECTED NOT DETECTED   Klebsiella oxytoca NOT DETECTED NOT DETECTED   Klebsiella pneumoniae NOT DETECTED NOT DETECTED   Proteus species NOT DETECTED NOT DETECTED   Serratia marcescens NOT DETECTED NOT DETECTED   Haemophilus influenzae NOT DETECTED NOT DETECTED   Neisseria meningitidis NOT DETECTED NOT DETECTED   Pseudomonas aeruginosa NOT DETECTED NOT DETECTED   Candida albicans NOT DETECTED NOT DETECTED   Candida glabrata NOT DETECTED NOT DETECTED   Candida krusei NOT DETECTED  NOT DETECTED   Candida parapsilosis NOT DETECTED NOT DETECTED   Candida tropicalis NOT DETECTED NOT DETECTED    Tobie Lords, PharmD, BCPS Clinical Pharmacist 12/25/2018  6:53 AM

## 2018-12-25 NOTE — Progress Notes (Signed)
Nutrition Brief Note RD working remotely.  RD received consult for assessment of nutrition requirements/status per COPD gold protocol  Wt Readings from Last 15 Encounters:  12/25/18 97.4 kg  02/09/18 88.5 kg  08/12/17 85.3 kg  05/27/17 85.3 kg  02/22/17 89.2 kg  07/15/16 87.5 kg  06/18/16 86.2 kg  02/18/16 88 kg  07/12/15 83 kg  04/25/15 83.9 kg  03/23/15 82.6 kg  10/08/14 82.8 kg  03/01/14 81.1 kg  02/02/14 89.8 kg    Spoke with patient over the phone. She reports her appetite and intake are good and unchanged from baseline. She reports eating 75-100% of her meals. She denies any unintentional weight los and reports she is weight-stable. Patient does not meet criteria for malnutrition at this time. Patient denies any educational needs at this time and verbalizes understanding of heart healthy diet.  Body mass index is 39.27 kg/m. Patient meets criteria for obesity class II based on current BMI.   Current diet order is heart healthy, patient is consuming approximately 75-100% of meals at this time (per patient report). Labs and medications reviewed.   No nutrition interventions warranted at this time. If nutrition issues arise, please consult RD.   Summer Barnacle, MS, RD, LDN Office: [phone] Pager: [phone] After Hours/Weekend Pager: [phone]

## 2018-12-25 NOTE — Progress Notes (Signed)
PROGRESS NOTE  Summer Bradley  DOB: 05-03-1945  PCP: [facility] [id number]  DOA: 12/23/2018  LOS: 2 days   Brief narrative: Summer Bradley is a 73 y.o. female with PMH of HTN, HLD, A. fib on Eliquis, stroke, with mild residual weakness in the left side, aortic aneurysm repair surgery, COPD on 2 L oxygen at home, hypothyroidism, GERD who lives at home alone and ambulates with a walker.  [facility]. Patient presented to the ED on 12/23/2018 with 24 to 48 hours history of worsening dyspnea, dry cough, wheezing and generalized weakness.  No fever, no recent exposure to Covid patients. Reports compliance to her medications.  No orthopnea or PND or worsening pedal edema.  In the ED, patient was in A. fib with RVR up to 120s.  Oxygen saturation remained more than 95% on baseline 3 L via nasal cannula.  However with even walking to the bathroom, she became dyspneic and her oxygen saturation dropped.   Work-up showed normal BMP, normal CBC. COVID-19 test remains pending TSH normal.  CT angio of chest is negative for pulmonary embolism or any other acute pulmonary process.  Additionally it showed moderate sized hiatal hernia,, emphysema, cholelithiasis without evidence of cholecystitis.  Patient was started on IV Cardizem drip for A. fib with RVR and admitted under hospitalist service for further evaluation and management.  Subjective: Patient was seen and examined this morning.  On low-flow oxygen via nasal cannula.  Her biggest problem seems to be anxiety.  Remains on Cardizem drip  Assessment/Plan:  Acute exacerbation of COPD  -Likely secondary to bronchitis.  No infiltrate and CT scan of chest.   -Started on steroids, bronchodilators, Mucinex as alert empiric treatment with IV Rocephin and doxycycline.  -Sputum culture ordered.  -Currently on prednisone 40 mg daily.  Per patient's daughter, patient was recently tapered off prednisone  after a long course.  Dyspnea on exertion Acute on chronic respite failure with hypoxia -On 3 L oxygen by nasal at home at rest.  Remains on the same flow at rest.  However on ambulation, patient gets significant symptoms and requires higher flow. -Continue to monitor oxygen requirement and wean down as tolerated. -No known history of CHF.  Takes Lasix 20 mg daily at home. -Echocardiogram ordered to look for LV function. -Patient is currently on Lasix 40 mg IV twice daily.  Based on echocardiogram, will titrate the dose of Lasix.  Atrial fibrillation with rapid ventricular response -Patient has history of A. fib and is on Cardizem and bisoprolol at home.  Reports compliance to medications.  TSH normal. -Presented with A. fib with RVR.  Started on Cardizem drip which she currently remains on.  Home medicines resumed as well.  Wean down Cardizem drip as tolerated to keep the heart rate under 110 bpm. -Continue Eliquis for anticoagulation.  Hypertension -Home meds include Cardizem, bisoprolol, Lasix.  Continue the same.  Hypothyroidism -TSH normal.  Continue Synthroid.  History of stroke -Several years ago.  Reports mild residual weakness on the left side.  Continue Eliquis and lovastatin.  Anxiety/depression -Resume Celexa and Seroquel. -Continue to reorient and come down the patient  Mobility: PT eval ordered. DVT prophylaxis:  Eliquis resumed Code Status:   Code Status: Full Code  Family Communication:   Expected Discharge:  Anticipate at least 2 to 3 days of hospital stay  Consultants:  None  Procedures:  None  Antimicrobials: Anti-infectives (From admission, onward)   Start  Dose/Rate Route Frequency Ordered Stop   12/25/18 2200  vancomycin (VANCOCIN) IVPB 1000 mg/200 mL premix  Status:  Discontinued     1,000 mg 200 mL/hr over 60 Minutes Intravenous Every 24 hours 12/24/18 2125 12/25/18 1107   12/24/18 2130  vancomycin (VANCOCIN) 2,000 mg in sodium chloride 0.9 %  500 mL IVPB     2,000 mg 250 mL/hr over 120 Minutes Intravenous  Once 12/24/18 2118 12/25/18 0014   12/24/18 1000  azithromycin (ZITHROMAX) tablet 500 mg  Status:  Discontinued     500 mg Oral Daily 12/23/18 2233 12/24/18 0056   12/24/18 1000  doxycycline (VIBRA-TABS) tablet 100 mg  Status:  Discontinued     100 mg Oral Every 12 hours 12/24/18 0159 12/24/18 2118   12/24/18 0100  doxycycline (VIBRA-TABS) tablet 100 mg  Status:  Discontinued     100 mg Oral Every 12 hours 12/24/18 0056 12/24/18 0159   12/23/18 2245  cefTRIAXone (ROCEPHIN) 1 g in sodium chloride 0.9 % 100 mL IVPB     1 g 200 mL/hr over 30 Minutes Intravenous Every 24 hours 12/23/18 2233 12/28/18 2244      Diet Order            Diet Heart Room service appropriate? Yes; Fluid consistency: Thin  Diet effective now              Infusions:  . sodium chloride 250 mL (12/24/18 2212)  . cefTRIAXone (ROCEPHIN)  IV 1 g (12/25/18 0054)  . diltiazem (CARDIZEM) infusion Stopped (12/24/18 1500)    Scheduled Meds: . apixaban  5 mg Oral BID  . bisoprolol  2.5 mg Oral BID  . busPIRone  10 mg Oral BID  . calcium-vitamin D  1 tablet Oral BID  . diltiazem  240 mg Oral Daily  . famotidine  20 mg Oral BID  . furosemide  40 mg Intravenous Q12H  . guaiFENesin  600 mg Oral BID  . levothyroxine  150 mcg Oral QAC breakfast  . LORazepam  0.5 mg Oral TID  . mouth rinse  15 mL Mouth Rinse BID  . potassium chloride SA  40 mEq Oral BID  . pravastatin  20 mg Oral q1800  . predniSONE  40 mg Oral Q breakfast  . QUEtiapine  25 mg Oral QHS  . tiotropium  1 capsule Inhalation Daily    PRN meds: sodium chloride, acetaminophen **OR** acetaminophen, albuterol, guaiFENesin-codeine, ipratropium-albuterol, magnesium hydroxide, polyvinyl alcohol, traZODone   Objective: Vitals:   12/25/18 0532 12/25/18 0721  BP: 124/86 128/83  Pulse: 90 99  Resp: 20 19  Temp: 97.8 F (36.6 C) 97.6 F (36.4 C)  SpO2: 96% 97%    Intake/Output Summary  (Last 24 hours) at 12/25/2018 1146 Last data filed at 12/25/2018 0054 Gross per 24 hour  Intake 1681.5 ml  Output 170 ml  Net 1511.5 ml   Filed Weights   12/23/18 1316 12/24/18 1635 12/25/18 0532  Weight: 90.7 kg 94.7 kg 97.4 kg   Weight change: 3.981 kg Body mass index is 39.27 kg/m.   Physical Exam: General exam: Appears calm and comfortable.  Propped up in bed. Skin: No rashes, lesions or ulcers. HEENT: Atraumatic, normocephalic, supple neck, no obvious bleeding Lungs: Diminished air entry in both bases, no crackles, mild scattered end expiratory wheezing. CVS: Irregular rhythm, tachycardic, no murmur GI/Abd soft, nontender, nondistended, bowel sound present CNS: Alert, awake, oriented x3 Psychiatry: Remains anxious lately. Extremities: No pedal edema today.  Data Review: I have personally  reviewed the laboratory data and studies available.  Recent Labs  Lab 12/23/18 1340 12/24/18 0628  WBC 10.5 9.9  HGB 15.8* 14.6  HCT 49.6* 46.4*  MCV 96.7 98.1  PLT 219 240   Recent Labs  Lab 12/23/18 1645 12/24/18 0628  NA 142 140  K 3.7 3.5  CL 97* 100  CO2 32 26  GLUCOSE 97 195*  BUN 13 15  CREATININE 1.01* 0.92  CALCIUM 9.5 8.6*    Terrilee Croak, MD  [facility] 12/25/2018

## 2018-12-26 LAB — CBC WITH DIFFERENTIAL/PLATELET
Abs Immature Granulocytes: 0.15 10*3/uL — ABNORMAL HIGH (ref 0.00–0.07)
Basophils Absolute: 0 10*3/uL (ref 0.0–0.1)
Basophils Relative: 0 %
Eosinophils Absolute: 0 10*3/uL (ref 0.0–0.5)
Eosinophils Relative: 0 %
HCT: 45.4 % (ref 36.0–46.0)
Hemoglobin: 14.6 g/dL (ref 12.0–15.0)
Immature Granulocytes: 1 %
Lymphocytes Relative: 8 %
Lymphs Abs: 1.5 10*3/uL (ref 0.7–4.0)
MCH: 31 pg (ref 26.0–34.0)
MCHC: 32.2 g/dL (ref 30.0–36.0)
MCV: 96.4 fL (ref 80.0–100.0)
Monocytes Absolute: 1.1 10*3/uL — ABNORMAL HIGH (ref 0.1–1.0)
Monocytes Relative: 6 %
Neutro Abs: 16.7 10*3/uL — ABNORMAL HIGH (ref 1.7–7.7)
Neutrophils Relative %: 85 %
Platelets: 296 10*3/uL (ref 150–400)
RBC: 4.71 MIL/uL (ref 3.87–5.11)
RDW: 14.8 % (ref 11.5–15.5)
WBC: 19.5 10*3/uL — ABNORMAL HIGH (ref 4.0–10.5)
nRBC: 0 % (ref 0.0–0.2)

## 2018-12-26 LAB — BASIC METABOLIC PANEL
Anion gap: 11 (ref 5–15)
BUN: 33 mg/dL — ABNORMAL HIGH (ref 8–23)
CO2: 30 mmol/L (ref 22–32)
Calcium: 9.3 mg/dL (ref 8.9–10.3)
Chloride: 105 mmol/L (ref 98–111)
Creatinine, Ser: 1.25 mg/dL — ABNORMAL HIGH (ref 0.44–1.00)
GFR calc Af Amer: 49 mL/min — ABNORMAL LOW (ref >60)
GFR calc non Af Amer: 42 mL/min — ABNORMAL LOW (ref >60)
Glucose, Bld: 132 mg/dL — ABNORMAL HIGH (ref 70–99)
Potassium: 4.1 mmol/L (ref 3.5–5.1)
Sodium: 146 mmol/L — ABNORMAL HIGH (ref 135–145)

## 2018-12-26 LAB — CULTURE, BLOOD (ROUTINE X 2): Special Requests: ADEQUATE

## 2018-12-26 MED ORDER — LORAZEPAM 2 MG/ML IJ SOLN
1.0000 mg | Freq: Four times a day (QID) | INTRAMUSCULAR | Status: DC | PRN
  Administered 2018-12-27 (×2): 1 mg via INTRAVENOUS
  Filled 2018-12-26 (×2): qty 1

## 2018-12-26 MED ORDER — DOXYCYCLINE HYCLATE 100 MG PO TABS
100.0000 mg | ORAL_TABLET | Freq: Two times a day (BID) | ORAL | Status: DC
  Administered 2018-12-26 – 2018-12-28 (×4): 100 mg via ORAL
  Filled 2018-12-26 (×4): qty 1

## 2018-12-26 MED ORDER — ALBUTEROL SULFATE (2.5 MG/3ML) 0.083% IN NEBU: 3.0000 mL | INHALATION_SOLUTION | Freq: Four times a day (QID) | RESPIRATORY_TRACT | Status: DC

## 2018-12-26 MED ORDER — MOMETASONE FURO-FORMOTEROL FUM 200-5 MCG/ACT IN AERO
2.0000 | INHALATION_SPRAY | Freq: Two times a day (BID) | RESPIRATORY_TRACT | Status: DC
  Administered 2018-12-26 – 2018-12-28 (×4): 2 via RESPIRATORY_TRACT
  Filled 2018-12-26: qty 8.8

## 2018-12-26 MED ORDER — DILTIAZEM HCL ER COATED BEADS 300 MG PO CP24
300.0000 mg | ORAL_CAPSULE | Freq: Every day | ORAL | Status: DC
  Administered 2018-12-27 – 2018-12-28 (×2): 300 mg via ORAL
  Filled 2018-12-26 (×4): qty 1

## 2018-12-26 MED ORDER — LEVALBUTEROL HCL 0.63 MG/3ML IN NEBU
0.6300 mg | INHALATION_SOLUTION | Freq: Four times a day (QID) | RESPIRATORY_TRACT | Status: DC
  Administered 2018-12-26 – 2018-12-27 (×3): 0.63 mg via RESPIRATORY_TRACT
  Filled 2018-12-26 (×3): qty 3

## 2018-12-26 MED ORDER — CITALOPRAM HYDROBROMIDE 20 MG PO TABS
10.0000 mg | ORAL_TABLET | Freq: Every day | ORAL | Status: DC
  Administered 2018-12-26: 10 mg via ORAL
  Filled 2018-12-26: qty 1

## 2018-12-26 MED ORDER — METHYLPREDNISOLONE SODIUM SUCC 125 MG IJ SOLR
60.0000 mg | Freq: Four times a day (QID) | INTRAMUSCULAR | Status: DC
  Administered 2018-12-26 – 2018-12-27 (×5): 60 mg via INTRAVENOUS
  Filled 2018-12-26 (×5): qty 2

## 2018-12-26 MED ORDER — CITALOPRAM HYDROBROMIDE 20 MG PO TABS: 40.0000 mg | ORAL_TABLET | Freq: Every day | ORAL | Status: DC

## 2018-12-26 NOTE — Care Management Important Message (Signed)
Important Message  Patient Details  Name: Summer Bradley MRN: [id number] Date of Birth: [birth year]   Medicare Important Message Given:  Yes     [name] 12/26/2018, 12:33 PM

## 2018-12-26 NOTE — Progress Notes (Signed)
OT Cancellation Note  Patient Details Name: Summer Bradley MRN: [id number] DOB: 11-24-1945   Cancelled Treatment:    Reason Eval/Treat Not Completed: Medical issues which prohibited therapy Order received for OT evaluation and chart reviewed.  Spoke to [place] and her HR continues to be in 119-130 range and is anxious and just received Ativan and cardizem.  Hold OT this morning and re-assess this afternoon. Thank you for the referral.  Chrys Racer, OTR/L, [place] [id number] 12/26/18, 11:03 AM

## 2018-12-26 NOTE — Clinical Social Work Note (Signed)
Left voicemail for [facility] social worker. Will discuss home health when she calls back.  [place] Scrape, [place]

## 2018-12-26 NOTE — TOC Initial Note (Signed)
Transition of Care [facility]) - Initial/Assessment Note    Patient Details  Name: Summer Bradley MRN: [id number] Date of Birth: Oct 19, 1945  Transition of Care [facility]) CM/SW Contact:    Candie Chroman, LCSW Phone Number: 12/26/2018, 9:52 AM  Clinical Narrative: Received call back from [place], [place] from [place]. Discussed PT recommendations for home health. [facility] per [facility]. They will assess her once she returns home to determine if she needs increased therapy at the center vs. Home health. Patient already has a rolling walker at home. Will need no home health orders for [facility] at discharge. No further concerns. CSW will continue to follow patient for support and facilitate return home when stable.                  Expected Discharge Plan: OP Rehab     Patient Goals and CMS Choice        Expected Discharge Plan and Services Expected Discharge Plan: OP Rehab       Living arrangements for the past 2 months: Single Family Home                 DME Arranged: N/A         HH Arranged: NA          Prior Living Arrangements/Services Living arrangements for the past 2 months: Single Family Home Lives with:: Adult Children Patient language and need for interpreter reviewed:: Yes Do you feel safe going back to the place where you live?: Yes      Need for Family Participation in Patient Care: Yes (Comment) Care giver support system in place?: Yes (comment) Current home services: DME Criminal Activity/Legal Involvement Pertinent to Current Situation/Hospitalization: No - Comment as needed  Activities of Daily Living Home Assistive Devices/Equipment: Oxygen, Walker (specify type), Wheelchair ADL Screening (condition at time of admission) Patient's cognitive ability adequate to safely complete daily activities?: No Is the patient deaf or have difficulty hearing?: No Does the patient have difficulty seeing, even when wearing  glasses/contacts?: No Does the patient have difficulty concentrating, remembering, or making decisions?: No Patient able to express need for assistance with ADLs?: Yes Does the patient have difficulty dressing or bathing?: Yes Independently performs ADLs?: No Communication: Independent Dressing (OT): Needs assistance Is this a change from baseline?: Pre-admission baseline Grooming: Needs assistance Is this a change from baseline?: Pre-admission baseline Feeding: Needs assistance Is this a change from baseline?: Pre-admission baseline Bathing: Needs assistance Is this a change from baseline?: Pre-admission baseline Toileting: Needs assistance Is this a change from baseline?: Pre-admission baseline In/Out Bed: Needs assistance Is this a change from baseline?: Pre-admission baseline Walks in Home: Independent with device (comment) Does the patient have difficulty walking or climbing stairs?: Yes Weakness of Legs: Left Weakness of Arms/Hands: None  Permission Sought/Granted                  Emotional Assessment Appearance:: Appears stated age Attitude/Demeanor/Rapport: Unable to Assess Affect (typically observed): Unable to Assess Orientation: : Oriented to Self, Oriented to Place, Oriented to  Time, Oriented to Situation Alcohol / Substance Use: Never Used Psych Involvement: No (comment)  Admission diagnosis:  Dyspnea [R06.00] COPD exacerbation ([place]) [J44.1] Patient Active Problem List   Diagnosis Date Noted  . Acute on chronic respiratory failure with hypoxemia ([place]) 02/03/2018  . Pulmonary emphysema ([place]) 04/25/2015  . Emphysema of lung ([place]) 05/09/2014  . 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[place]) 05/09/2014  . Hyperlipidemia 05/09/2014  . GI bleed   . Benign neoplasm of colon 03/03/2014  . Diverticulosis of colon without hemorrhage 03/03/2014  . Arteriovenous  malformation of jejunum 03/03/2014  . Jejunal ulcer 03/03/2014  . Gastric polyps 03/03/2014  . Acute posthemorrhagic anemia 03/02/2014  . Melena 02/27/2014  . LGI bleed   . Acute on chronic respiratory failure ([place]) 02/16/2014  . Tracheostomy status ([place]) 02/16/2014  . Aspiration pneumonitis ([place])   . Spontaneous pneumothorax   . Thrush   . COPD exacerbation ([place])   . SOB (shortness of breath)   . HLD (hyperlipidemia)   . Essential hypertension   . Dysphagia, pharyngoesophageal phase   . Acute tracheobronchitis 02/04/2014  . Atelectasis of left lung 02/04/2014  . Wheezing   . Acute respiratory failure ([place])   . Paroxysmal atrial fibrillation (H[facility])   . 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[place])   . Stroke ([place]) 01/29/2014  . Respiratory failure [facility]) 01/29/2014   PCP:  [facility]:   [place] [place] [address] Phone: [phone] Fax: [phone]  [place] [address], [place] [address] Phone: [phone] Fax: [place], [place] [address] [place] [address] Phone: [phone] Fax: [phone]     Social Determinants of Health (SDOH) Interventions    Readmission Risk Interventions No flowsheet data found.

## 2018-12-26 NOTE — Progress Notes (Signed)
Patient care taken over by this RN due to patient being started on cardizem drip.  Drip started at 45ml/hr. Patient tolerating well.

## 2018-12-26 NOTE — Progress Notes (Addendum)
PROGRESS NOTE  Summer Bradley  DOB: 02/27/1945  PCP: [place] [id number]  DOA: 12/23/2018  LOS: 3 days   Brief narrative: Summer Bradley is a 73 y.o. female with PMH of HTN, HLD, A. fib on Eliquis, stroke, with mild residual weakness in the left side, aortic aneurysm repair surgery, COPD on 2 L oxygen at home, hypothyroidism, GERD who lives at home alone and ambulates with a walker.  [facility]. Patient presented to the ED on 12/23/2018 with 24 to 48 hours history of worsening dyspnea, dry cough, wheezing and generalized weakness.  No fever, no recent exposure to Covid patients. Reports compliance to her medications.  No orthopnea or PND or worsening pedal edema.  In the ED, patient was in A. fib with RVR up to 120s.  Oxygen saturation remained more than 95% on baseline 3 L via nasal cannula.  However with even walking to the bathroom, she became dyspneic and her oxygen saturation dropped.   Work-up showed normal BMP, normal CBC. COVID-19 test remains pending TSH normal.  CT angio of chest is negative for pulmonary embolism or any other acute pulmonary process.  Additionally it showed moderate sized hiatal hernia,, emphysema, cholelithiasis without evidence of cholecystitis.  Patient was started on IV Cardizem drip for A. fib with RVR and admitted under hospitalist service for further evaluation and management.  Subjective: Patient was seen and examined this morning.  Seems very anxious.  Daughter at bedside.  Patient states he cannot breathe.  She is breathing fine on 3 L oxygen by nasal cannula and maintaining oxygen saturation.  No crackles.  Mild scattered end expiratory wheezing. She already seems dry.  Assessment/Plan: Acute exacerbation of COPD  -Likely secondary to bronchitis.  No infiltrate and CT scan of chest.   -Continues to to feel short of breath despite having good oxygen saturation on 3 L by nasal cannula.   -Has  mild wheezing.  We will switch her from oral prednisone to IV Solu-Medrol.   -Continue bronchodilators, Mucinex.   -Continue current treatment with IV Rocephin and doxycycline.   -Sputum culture ordered.   Dyspnea on exertion Acute on chronic respiratory failure with hypoxia -On 3 L oxygen by nasal at home at rest. Remains on the same flow of O2 at rest.  However on ambulation, patient gets significant symptoms and requires higher flow. -Continue to monitor oxygen requirement and wean down as tolerated. -No known history of CHF.  Takes Lasix 20 mg daily at home. -Echocardiogram showed EF 55 to 60%. -Adequately diuresed with Lasix IV.  Stopped his morning dose. -Noted mild increase in creatinine this morning.  Expect improvement after IV Lasix has been stopped. -Monitor creatinine tomorrow.  Atrial fibrillation with rapid ventricular response -Patient has history of A. fib and is on Cardizem and bisoprolol at home.  Reports compliance to medications.  TSH normal. -Presented with A. fib with RVR.  Home meds resumed.  Started on Cardizem drip which she was eventually weaned off. -Patient heart rate is between up to 130s this morning again.  Resume Cardizem drip at a low rate today.  Will increase oral Cardizem dose for tomorrow. -Continue Eliquis for anticoagulation.  Coag negative staph in blood culture -Likely contamination.  1 out of 4 blood culture positive.  Repeat blood culture sent.  IV vancomycin stopped.  Hypertension -Home meds include Cardizem, bisoprolol, Lasix.  Continue the same.  Hypothyroidism -TSH normal. Continue Synthroid.  History of stroke -Several years  ago. Reports mild residual weakness on the left side. Continue Eliquis and lovastatin.  Anxiety/depression -Resumed Celexa and Seroquel, as well as PRN IV Ativan. -Continue to reorient and come down the patient  Mobility: PT eval ordered DVT prophylaxis:  Eliquis resumed Code Status:   Code Status: Full Code    Family Communication:   Expected Discharge:  Anticipate at least 2 to 3 days of hospital stay  Consultants:  None  Procedures:  None  Antimicrobials: Anti-infectives (From admission, onward)   Start     Dose/Rate Route Frequency Ordered Stop   12/25/18 2200  vancomycin (VANCOCIN) IVPB 1000 mg/200 mL premix  Status:  Discontinued     1,000 mg 200 mL/hr over 60 Minutes Intravenous Every 24 hours 12/24/18 2125 12/25/18 1107   12/24/18 2130  vancomycin (VANCOCIN) 2,000 mg in sodium chloride 0.9 % 500 mL IVPB     2,000 mg 250 mL/hr over 120 Minutes Intravenous  Once 12/24/18 2118 12/25/18 0014   12/24/18 1000  azithromycin (ZITHROMAX) tablet 500 mg  Status:  Discontinued     500 mg Oral Daily 12/23/18 2233 12/24/18 0056   12/24/18 1000  doxycycline (VIBRA-TABS) tablet 100 mg  Status:  Discontinued     100 mg Oral Every 12 hours 12/24/18 0159 12/24/18 2118   12/24/18 0100  doxycycline (VIBRA-TABS) tablet 100 mg  Status:  Discontinued     100 mg Oral Every 12 hours 12/24/18 0056 12/24/18 0159   12/23/18 2245  cefTRIAXone (ROCEPHIN) 1 g in sodium chloride 0.9 % 100 mL IVPB     1 g 200 mL/hr over 30 Minutes Intravenous Every 24 hours 12/23/18 2233 12/28/18 2244      Diet Order            Diet Heart Room service appropriate? Yes; Fluid consistency: Thin  Diet effective now              Infusions:   sodium chloride Stopped (12/26/18 0140)   cefTRIAXone (ROCEPHIN)  IV Stopped (12/25/18 2313)   diltiazem (CARDIZEM) infusion 5 mg/hr (12/26/18 1200)    Scheduled Meds:  apixaban  5 mg Oral BID   bisoprolol  2.5 mg Oral BID   busPIRone  10 mg Oral BID   calcium-vitamin D  1 tablet Oral BID   citalopram  40 mg Oral QHS   diltiazem  240 mg Oral Daily   famotidine  20 mg Oral BID   guaiFENesin  600 mg Oral BID   levothyroxine  150 mcg Oral QAC breakfast   LORazepam  0.5 mg Oral TID   mouth rinse  15 mL Mouth Rinse BID   methylPREDNISolone (SOLU-MEDROL)  injection  60 mg Intravenous Q6H   potassium chloride SA  40 mEq Oral BID   pravastatin  20 mg Oral q1800   QUEtiapine  25 mg Oral QHS   tiotropium  1 capsule Inhalation Daily    PRN meds: sodium chloride, acetaminophen **OR** acetaminophen, albuterol, guaiFENesin-codeine, ipratropium-albuterol, LORazepam, magnesium hydroxide, polyvinyl alcohol, traZODone   Objective: Vitals:   12/26/18 0755 12/26/18 1204  BP: (!) 147/90 (!) 146/101  Pulse: (!) 112 (!) 103  Resp: 19   Temp: 97.7 F (36.5 C)   SpO2: 93% 96%    Intake/Output Summary (Last 24 hours) at 12/26/2018 1227 Last data filed at 12/26/2018 0943 Gross per 24 hour  Intake 1068.44 ml  Output 1300 ml  Net -231.56 ml   Filed Weights   12/23/18 1316 12/24/18 1635 12/25/18 0532  Weight: 90.7  kg 94.7 kg 97.4 kg   Weight change:  Body mass index is 39.27 kg/m.   Physical Exam: General exam: Appears calm and comfortable. Propped up in bed. Skin: No rashes, lesions or ulcers. HEENT: Atraumatic, normocephalic, supple neck, no obvious bleeding Lungs: Diminished air entry in both bases, no crackles, mild scattered end expiratory wheezing. CVS: Irregular rhythm, tachycardic, no murmur GI/Abd soft, nontender, nondistended, bowel sound present CNS: Alert, awake, oriented x3 Psychiatry: Remains anxious mostly. Extremities: No pedal edema.  Rather seems dehydrated.  Data Review: I have personally reviewed the laboratory data and studies available.  Recent Labs  Lab 12/23/18 1340 12/24/18 0628 12/26/18 0439  WBC 10.5 9.9 19.5*  NEUTROABS  --   --  16.7*  HGB 15.8* 14.6 14.6  HCT 49.6* 46.4* 45.4  MCV 96.7 98.1 96.4  PLT 219 240 296   Recent Labs  Lab 12/23/18 1645 12/24/18 0628 12/26/18 0439  NA 142 140 146*  K 3.7 3.5 4.1  CL 97* 100 105  CO2 32 26 30  GLUCOSE 97 195* 132*  BUN 13 15 33*  CREATININE 1.01* 0.92 1.25*  CALCIUM 9.5 8.6* 9.3    Terrilee Croak, MD  [facility] 12/26/2018

## 2018-12-27 DIAGNOSIS — I272 Pulmonary hypertension, unspecified: Secondary | ICD-10-CM

## 2018-12-27 DIAGNOSIS — I480 Paroxysmal atrial fibrillation: Secondary | ICD-10-CM

## 2018-12-27 DIAGNOSIS — J189 Pneumonia, unspecified organism: Secondary | ICD-10-CM

## 2018-12-27 DIAGNOSIS — I5032 Chronic diastolic (congestive) heart failure: Secondary | ICD-10-CM

## 2018-12-27 LAB — CBC WITH DIFFERENTIAL/PLATELET
Abs Immature Granulocytes: 0.12 10*3/uL — ABNORMAL HIGH (ref 0.00–0.07)
Basophils Absolute: 0 10*3/uL (ref 0.0–0.1)
Basophils Relative: 0 %
Eosinophils Absolute: 0 10*3/uL (ref 0.0–0.5)
Eosinophils Relative: 0 %
HCT: 47.7 % — ABNORMAL HIGH (ref 36.0–46.0)
Hemoglobin: 15 g/dL (ref 12.0–15.0)
Immature Granulocytes: 1 %
Lymphocytes Relative: 5 %
Lymphs Abs: 0.7 10*3/uL (ref 0.7–4.0)
MCH: 30.7 pg (ref 26.0–34.0)
MCHC: 31.4 g/dL (ref 30.0–36.0)
MCV: 97.5 fL (ref 80.0–100.0)
Monocytes Absolute: 0.2 10*3/uL (ref 0.1–1.0)
Monocytes Relative: 2 %
Neutro Abs: 12.6 10*3/uL — ABNORMAL HIGH (ref 1.7–7.7)
Neutrophils Relative %: 92 %
Platelets: 289 10*3/uL (ref 150–400)
RBC: 4.89 MIL/uL (ref 3.87–5.11)
RDW: 14.8 % (ref 11.5–15.5)
WBC: 13.6 10*3/uL — ABNORMAL HIGH (ref 4.0–10.5)
nRBC: 0 % (ref 0.0–0.2)

## 2018-12-27 LAB — BASIC METABOLIC PANEL
Anion gap: 12 (ref 5–15)
BUN: 37 mg/dL — ABNORMAL HIGH (ref 8–23)
CO2: 25 mmol/L (ref 22–32)
Calcium: 9.6 mg/dL (ref 8.9–10.3)
Chloride: 106 mmol/L (ref 98–111)
Creatinine, Ser: 0.99 mg/dL (ref 0.44–1.00)
GFR calc Af Amer: >60 mL/min (ref >60)
GFR calc non Af Amer: 56 mL/min — ABNORMAL LOW (ref >60)
Glucose, Bld: 206 mg/dL — ABNORMAL HIGH (ref 70–99)
Potassium: 4.6 mmol/L (ref 3.5–5.1)
Sodium: 143 mmol/L (ref 135–145)

## 2018-12-27 MED ORDER — LEVALBUTEROL HCL 0.63 MG/3ML IN NEBU
0.6300 mg | INHALATION_SOLUTION | Freq: Four times a day (QID) | RESPIRATORY_TRACT | Status: DC | PRN
  Administered 2018-12-27: 0.63 mg via RESPIRATORY_TRACT
  Filled 2018-12-27: qty 3

## 2018-12-27 MED ORDER — IPRATROPIUM-ALBUTEROL 0.5-2.5 (3) MG/3ML IN SOLN
3.0000 mL | Freq: Four times a day (QID) | RESPIRATORY_TRACT | Status: DC
  Administered 2018-12-27 – 2019-01-03 (×27): 3 mL via RESPIRATORY_TRACT
  Filled 2018-12-27 (×27): qty 3

## 2018-12-27 MED ORDER — BUSPIRONE HCL 15 MG PO TABS
15.0000 mg | ORAL_TABLET | Freq: Two times a day (BID) | ORAL | Status: DC
  Administered 2018-12-27 – 2018-12-28 (×2): 15 mg via ORAL
  Filled 2018-12-27 (×5): qty 1

## 2018-12-27 MED ORDER — METHYLPREDNISOLONE SODIUM SUCC 125 MG IJ SOLR
60.0000 mg | Freq: Two times a day (BID) | INTRAMUSCULAR | Status: DC
  Administered 2018-12-27: 60 mg via INTRAVENOUS
  Filled 2018-12-27: qty 2

## 2018-12-27 MED ORDER — CITALOPRAM HYDROBROMIDE 20 MG PO TABS
20.0000 mg | ORAL_TABLET | Freq: Every day | ORAL | Status: DC
  Administered 2018-12-27 – 2019-01-02 (×4): 20 mg via ORAL
  Filled 2018-12-27 (×4): qty 1

## 2018-12-27 MED ORDER — LORAZEPAM 0.5 MG PO TABS: 0.5000 mg | ORAL_TABLET | Freq: Two times a day (BID) | ORAL | Status: DC | PRN

## 2018-12-27 MED ORDER — BISOPROLOL FUMARATE 5 MG PO TABS
5.0000 mg | ORAL_TABLET | Freq: Two times a day (BID) | ORAL | Status: DC
  Administered 2018-12-27 – 2019-01-03 (×8): 5 mg via ORAL
  Filled 2018-12-27 (×17): qty 1

## 2018-12-27 MED ORDER — LORAZEPAM 2 MG/ML IJ SOLN
0.5000 mg | Freq: Four times a day (QID) | INTRAMUSCULAR | Status: DC | PRN
  Administered 2018-12-27 – 2018-12-28 (×2): 0.5 mg via INTRAVENOUS
  Filled 2018-12-27 (×2): qty 1

## 2018-12-27 MED ORDER — SODIUM CHLORIDE 0.9% FLUSH
3.0000 mL | Freq: Two times a day (BID) | INTRAVENOUS | Status: DC
  Administered 2018-12-27 – 2019-01-03 (×15): 3 mL via INTRAVENOUS

## 2018-12-27 NOTE — Progress Notes (Signed)
OT Cancellation Note  Patient Details Name: Summer Bradley MRN: [id number] DOB: Apr 21, 1945   Cancelled Treatment:    Reason Eval/Treat Not Completed: Patient at procedure or test/ unavailable  Attempted to see patient for OT evaluation again today and just received anxiety meds and breathing treatment per RN and continues to be very anxious with rec to hold on OT evaluation for a few hours.  Will attempt again later as time and schedules permits.  Chrys Racer, OTR/L, NTMTC ascom [phone] 12/27/18, 8:52 AM

## 2018-12-27 NOTE — Progress Notes (Addendum)
PROGRESS NOTE  Summer Bradley  DOB: March 23, 1945  PCP: [place] [id number]  DOA: 12/23/2018  LOS: 4 days   Brief narrative: Summer Bradley is a 73 y.o. female with PMH of HTN, HLD, A. fib on Eliquis, stroke, with mild residual weakness in the left side, aortic aneurysm repair surgery, COPD on 2 L oxygen at home, hypothyroidism, GERD who lives at home alone and ambulates with a walker.  [facility]. Patient presented to the ED on 12/23/2018 with 24 to 48 hours history of worsening dyspnea, dry cough, wheezing and generalized weakness.  No fever, no recent exposure to COVID patients. Reports compliance to her medications.  No orthopnea or PND or worsening pedal edema.  In the ED, patient was in A. fib with RVR up to 120s.  Oxygen saturation remained more than 95% on baseline 3 L via nasal cannula.  However with even walking to the bathroom, she became dyspneic and her oxygen saturation dropped.   Work-up showed normal BMP, normal CBC. COVID-19 test remains pending TSH normal.  CT angio of chest is negative for pulmonary embolism or any other acute pulmonary process.  Additionally it showed moderate sized hiatal hernia,, emphysema, cholelithiasis without evidence of cholecystitis.  Patient was started on IV Cardizem drip for A. fib with RVR and admitted under hospitalist service for further evaluation and management.  Subjective: Patient was seen and examined this morning.  Patient was under the influence of Ativan at the time of my evaluation.  Earlier, patient was agitated, yelling out and had to be given as needed IV Ativan. Daughter [name] present at bedside.  Patient is oxygen supplementation 3 L by nasal cannula which is her baseline.  No wheezing, crackles or cough.  Assessment/Plan: Acute exacerbation of COPD  -Likely secondary to bronchitis.  No infiltrate and CT scan of chest.   -Continues to to feel short of breath despite  having good oxygen saturation on 3 L by nasal cannula.   -Minimal wheezing.  On IV steroids 60 mg every 6 hours which I will taper down today. -Continue bronchodilators, Mucinex.  Switched albuterol to Xopenex because of persistent tachycardia. -Continue current treatment with IV Rocephin and doxycycline.   -Sputum culture sent.  Dyspnea on exertion Acute on chronic respiratory failure with hypoxia -On 3 L oxygen by nasal at home at rest.  At times, patient requires higher flow and is mostly because of anxiety.  She is already back to her baseline level now.   -No known history of CHF.  Takes Lasix 20 mg daily at home. -Echocardiogram showed EF 55 to 60%.  -Adequately diuresed with Lasix IV.  Seems clinically dry.  Currently Lasix remains on hold.  -Monitor creatinine.  Atrial fibrillation with rapid ventricular response -Patient has history of persistent A. Fib and presented with A. fib with RVR.improved on Cardizem drip.   -Home meds include bisoprolol and Cardizem 240 mg daily which have been resumed.  Patient continues to remain on Cardizem drip however.  I increased the dose of oral Cardizem to 300 mg this morning.  -We will also ask her cardiology consultation today. -Continue Eliquis for anticoagulation.  Episodes of delirium Acute metabolic encephalopathy. History of dementia and anxiety disorder -Patient has significant degree of anxiety which is confirmed by her daughter as well [place] Dr. Lutricia Feil.  I checked her list of psych meds with Dr. Lutricia Feil this morning.  Patient is on BuSpar 15 mg twice  daily, Celexa 20 mg daily and PRN Ativan 0.5 mg twice daily.  -I will straighten out the medications.  Patient is also on IV Ativan as needed for episodes of delirium. -QTC 380 ms on repeat EKG today.  Coag negative staph in blood culture -Likely contamination.  1 out of 4 blood culture positive.  Repeat blood culture did not show any growth.  IV vancomycin stopped.   Hypertension -Home meds include Cardizem, bisoprolol, Lasix.  Keep Lasix on hold.  Continue others.  Hypothyroidism -TSH normal. Continue Synthroid.  History of ischemic stroke -Several years ago. Reports mild residual weakness on the left side. Continue Eliquis and lovastatin.  Anxiety/depression -Resumed Celexa and Seroquel, as well as PRN IV Ativan. -Continue to reorient and calm down the patient  Mobility: PT eval obtained.  Home health PT recommended. DVT prophylaxis:  Eliquis  Code Status:   Code Status: Full Code  Family Communication:  Spoke with patient's daughter [name] at bedside today. Expected Discharge:  Anticipate at least 2 to 3 days of hospital stay  Consultants:  None  Procedures:  None  Antimicrobials: Anti-infectives (From admission, onward)   Start     Dose/Rate Route Frequency Ordered Stop   12/26/18 2200  doxycycline (VIBRA-TABS) tablet 100 mg     100 mg Oral Every 12 hours 12/26/18 1655     12/25/18 2200  vancomycin (VANCOCIN) IVPB 1000 mg/200 mL premix  Status:  Discontinued     1,000 mg 200 mL/hr over 60 Minutes Intravenous Every 24 hours 12/24/18 2125 12/25/18 1107   12/24/18 2130  vancomycin (VANCOCIN) 2,000 mg in sodium chloride 0.9 % 500 mL IVPB     2,000 mg 250 mL/hr over 120 Minutes Intravenous  Once 12/24/18 2118 12/25/18 0014   12/24/18 1000  azithromycin (ZITHROMAX) tablet 500 mg  Status:  Discontinued     500 mg Oral Daily 12/23/18 2233 12/24/18 0056   12/24/18 1000  doxycycline (VIBRA-TABS) tablet 100 mg  Status:  Discontinued     100 mg Oral Every 12 hours 12/24/18 0159 12/24/18 2118   12/24/18 0100  doxycycline (VIBRA-TABS) tablet 100 mg  Status:  Discontinued     100 mg Oral Every 12 hours 12/24/18 0056 12/24/18 0159   12/23/18 2245  cefTRIAXone (ROCEPHIN) 1 g in sodium chloride 0.9 % 100 mL IVPB     1 g 200 mL/hr over 30 Minutes Intravenous Every 24 hours 12/23/18 2233 12/28/18 2244      Diet Order            Diet Heart  Room service appropriate? Yes; Fluid consistency: Thin  Diet effective now              Infusions:  . sodium chloride Stopped (12/27/18 0028)  . cefTRIAXone (ROCEPHIN)  IV Stopped (12/26/18 2330)  . diltiazem (CARDIZEM) infusion 5 mg/hr (12/27/18 0523)    Scheduled Meds: . apixaban  5 mg Oral BID  . bisoprolol  2.5 mg Oral BID  . busPIRone  15 mg Oral BID  . calcium-vitamin D  1 tablet Oral BID  . citalopram  20 mg Oral QHS  . diltiazem  300 mg Oral Daily  . doxycycline  100 mg Oral Q12H  . famotidine  20 mg Oral BID  . guaiFENesin  600 mg Oral BID  . ipratropium-albuterol  3 mL Nebulization Q6H  . levothyroxine  150 mcg Oral QAC breakfast  . mouth rinse  15 mL Mouth Rinse BID  . methylPREDNISolone (SOLU-MEDROL) injection  60  mg Intravenous Q12H  . mometasone-formoterol  2 puff Inhalation BID  . pravastatin  20 mg Oral q1800  . sodium chloride flush  3 mL Intravenous Q12H  . tiotropium  1 capsule Inhalation Daily    PRN meds: sodium chloride, acetaminophen **OR** acetaminophen, guaiFENesin-codeine, levalbuterol, LORazepam, LORazepam, magnesium hydroxide, polyvinyl alcohol, traZODone   Objective: Vitals:   12/27/18 1013 12/27/18 1017  BP: (!) 145/107 (!) 157/95  Pulse: (!) 113 (!) 110  Resp:    Temp:    SpO2: 92% 94%    Intake/Output Summary (Last 24 hours) at 12/27/2018 1246 Last data filed at 12/27/2018 1006 Gross per 24 hour  Intake 163.51 ml  Output 552 ml  Net -388.49 ml   Filed Weights   12/24/18 1635 12/25/18 0532 12/27/18 0606  Weight: 94.7 kg 97.4 kg 96.3 kg   Weight change:  Body mass index is 38.83 kg/m.   Physical Exam: General exam: Under the effect of IV Ativan today. Skin: No rashes, lesions or ulcers. HEENT: Atraumatic, normocephalic, supple neck, no obvious bleeding Lungs: Clear to auscultation mostly, only few scattered mild wheezing. CVS: Irregular rhythm, tachycardic, no murmur GI/Abd soft, nontender, nondistended, bowel sound  present CNS: Alert, awake, oriented x3 Psychiatry: Remains anxious mostly. Extremities: No pedal edema.  Rather seems dehydrated.  Data Review: I have personally reviewed the laboratory data and studies available.  Recent Labs  Lab 12/23/18 1340 12/24/18 0628 12/26/18 0439 12/27/18 0506  WBC 10.5 9.9 19.5* 13.6*  NEUTROABS  --   --  16.7* 12.6*  HGB 15.8* 14.6 14.6 15.0  HCT 49.6* 46.4* 45.4 47.7*  MCV 96.7 98.1 96.4 97.5  PLT 219 240 296 289   Recent Labs  Lab 12/23/18 1645 12/24/18 0628 12/26/18 0439 12/27/18 0506  NA 142 140 146* 143  K 3.7 3.5 4.1 4.6  CL 97* 100 105 106  CO2 32 26 30 25   GLUCOSE 97 195* 132* 206*  BUN 13 15 33* 37*  CREATININE 1.01* 0.92 1.25* 0.99  CALCIUM 9.5 8.6* 9.3 9.6    Terrilee Croak, MD  [facility] 12/27/2018
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
## 2018-12-28 ENCOUNTER — Inpatient Hospital Stay: Payer: Medicare (Managed Care)

## 2018-12-28 DIAGNOSIS — I10 Essential (primary) hypertension: Secondary | ICD-10-CM

## 2018-12-28 DIAGNOSIS — J9621 Acute and chronic respiratory failure with hypoxia: Secondary | ICD-10-CM

## 2018-12-28 DIAGNOSIS — F419 Anxiety disorder, unspecified: Secondary | ICD-10-CM

## 2018-12-28 DIAGNOSIS — F329 Major depressive disorder, single episode, unspecified: Secondary | ICD-10-CM

## 2018-12-28 DIAGNOSIS — J9622 Acute and chronic respiratory failure with hypercapnia: Secondary | ICD-10-CM

## 2018-12-28 DIAGNOSIS — I4891 Unspecified atrial fibrillation: Secondary | ICD-10-CM

## 2018-12-28 DIAGNOSIS — J9611 Chronic respiratory failure with hypoxia: Secondary | ICD-10-CM

## 2018-12-28 DIAGNOSIS — R410 Disorientation, unspecified: Secondary | ICD-10-CM

## 2018-12-28 LAB — CBC WITH DIFFERENTIAL/PLATELET
Abs Immature Granulocytes: 0.2 10*3/uL — ABNORMAL HIGH (ref 0.00–0.07)
Basophils Absolute: 0 10*3/uL (ref 0.0–0.1)
Basophils Relative: 0 %
Eosinophils Absolute: 0 10*3/uL (ref 0.0–0.5)
Eosinophils Relative: 0 %
HCT: 45.6 % (ref 36.0–46.0)
Hemoglobin: 15.2 g/dL — ABNORMAL HIGH (ref 12.0–15.0)
Immature Granulocytes: 1 %
Lymphocytes Relative: 4 %
Lymphs Abs: 0.8 10*3/uL (ref 0.7–4.0)
MCH: 31 pg (ref 26.0–34.0)
MCHC: 33.3 g/dL (ref 30.0–36.0)
MCV: 93.1 fL (ref 80.0–100.0)
Monocytes Absolute: 0.7 10*3/uL (ref 0.1–1.0)
Monocytes Relative: 4 %
Neutro Abs: 17.5 10*3/uL — ABNORMAL HIGH (ref 1.7–7.7)
Neutrophils Relative %: 91 %
Platelets: 311 10*3/uL (ref 150–400)
RBC: 4.9 MIL/uL (ref 3.87–5.11)
RDW: 14.7 % (ref 11.5–15.5)
WBC: 19.2 10*3/uL — ABNORMAL HIGH (ref 4.0–10.5)
nRBC: 0.1 % (ref 0.0–0.2)

## 2018-12-28 LAB — BLOOD GAS, ARTERIAL
Acid-Base Excess: 6.2 mmol/L — ABNORMAL HIGH (ref 0.0–2.0)
Bicarbonate: 30.6 mmol/L — ABNORMAL HIGH (ref 20.0–28.0)
FIO2: 0.32
O2 Saturation: 94.9 %
Patient temperature: 37
pCO2 arterial: 42 mmHg (ref 32.0–48.0)
pH, Arterial: 7.47 — ABNORMAL HIGH (ref 7.350–7.450)
pO2, Arterial: 70 mmHg — ABNORMAL LOW (ref 83.0–108.0)

## 2018-12-28 LAB — BASIC METABOLIC PANEL
Anion gap: 12 (ref 5–15)
BUN: 42 mg/dL — ABNORMAL HIGH (ref 8–23)
CO2: 26 mmol/L (ref 22–32)
Calcium: 9.9 mg/dL (ref 8.9–10.3)
Chloride: 106 mmol/L (ref 98–111)
Creatinine, Ser: 1.01 mg/dL — ABNORMAL HIGH (ref 0.44–1.00)
GFR calc Af Amer: >60 mL/min (ref >60)
GFR calc non Af Amer: 55 mL/min — ABNORMAL LOW (ref >60)
Glucose, Bld: 189 mg/dL — ABNORMAL HIGH (ref 70–99)
Potassium: 4.2 mmol/L (ref 3.5–5.1)
Sodium: 144 mmol/L (ref 135–145)

## 2018-12-28 LAB — HEMOGLOBIN A1C
Hgb A1c MFr Bld: 6.9 % — ABNORMAL HIGH (ref 4.8–5.6)
Mean Plasma Glucose: 151.33 mg/dL

## 2018-12-28 LAB — GLUCOSE, CAPILLARY
Glucose-Capillary: 210 mg/dL — ABNORMAL HIGH (ref 70–99)
Glucose-Capillary: 197 mg/dL — ABNORMAL HIGH (ref 70–99)

## 2018-12-28 LAB — MRSA PCR SCREENING: MRSA by PCR: NEGATIVE

## 2018-12-28 MED ORDER — SODIUM CHLORIDE 0.9 % IV SOLN
100.0000 mg | Freq: Two times a day (BID) | INTRAVENOUS | Status: DC
  Administered 2018-12-28 – 2018-12-30 (×4): 100 mg via INTRAVENOUS
  Filled 2018-12-28 (×5): qty 100

## 2018-12-28 MED ORDER — MORPHINE SULFATE (PF) 2 MG/ML IV SOLN
2.0000 mg | Freq: Once | INTRAVENOUS | Status: AC
  Administered 2018-12-28: 2 mg via INTRAVENOUS

## 2018-12-28 MED ORDER — SODIUM CHLORIDE 0.9% FLUSH: 10.0000 mL | INTRAVENOUS | Status: DC | PRN

## 2018-12-28 MED ORDER — MORPHINE SULFATE (PF) 2 MG/ML IV SOLN
1.0000 mg | INTRAVENOUS | Status: DC | PRN
  Administered 2018-12-28 – 2018-12-29 (×2): 2 mg via INTRAVENOUS
  Filled 2018-12-28 (×3): qty 1

## 2018-12-28 MED ORDER — METHYLPREDNISOLONE SODIUM SUCC 40 MG IJ SOLR
40.0000 mg | Freq: Every day | INTRAMUSCULAR | Status: DC
  Administered 2018-12-28 – 2018-12-29 (×2): 40 mg via INTRAVENOUS
  Filled 2018-12-28 (×2): qty 1

## 2018-12-28 MED ORDER — HYDRALAZINE HCL 20 MG/ML IJ SOLN
10.0000 mg | INTRAMUSCULAR | Status: DC | PRN
  Administered 2018-12-28 – 2018-12-29 (×2): 10 mg via INTRAVENOUS
  Administered 2018-12-30: 20 mg via INTRAVENOUS
  Filled 2018-12-28 (×3): qty 1

## 2018-12-28 MED ORDER — DEXMEDETOMIDINE HCL IN NACL 400 MCG/100ML IV SOLN
0.4000 ug/kg/h | INTRAVENOUS | Status: DC
  Administered 2018-12-28: 20:00:00 0.4 ug/kg/h via INTRAVENOUS
  Administered 2018-12-29: 0.8 ug/kg/h via INTRAVENOUS
  Administered 2018-12-29: 1.2 ug/kg/h via INTRAVENOUS
  Administered 2018-12-30: 0.6 ug/kg/h via INTRAVENOUS
  Administered 2018-12-30: 0.4 ug/kg/h via INTRAVENOUS
  Administered 2018-12-30 – 2018-12-31 (×3): 0.8 ug/kg/h via INTRAVENOUS
  Filled 2018-12-28 (×11): qty 100

## 2018-12-28 MED ORDER — FUROSEMIDE 10 MG/ML IJ SOLN
40.0000 mg | Freq: Once | INTRAMUSCULAR | Status: AC
  Administered 2018-12-28: 40 mg via INTRAVENOUS
  Filled 2018-12-28: qty 4

## 2018-12-28 MED ORDER — PREDNISONE 20 MG PO TABS: 30.0000 mg | ORAL_TABLET | Freq: Every day | ORAL | Status: DC

## 2018-12-28 MED ORDER — QUETIAPINE FUMARATE 25 MG PO TABS
25.0000 mg | ORAL_TABLET | Freq: Every day | ORAL | Status: DC
  Administered 2018-12-31 – 2019-01-02 (×3): 25 mg via ORAL
  Filled 2018-12-28 (×3): qty 1

## 2018-12-28 MED ORDER — INSULIN ASPART 100 UNIT/ML ~~LOC~~ SOLN
0.0000 [IU] | SUBCUTANEOUS | Status: DC
  Administered 2018-12-28: 3 [IU] via SUBCUTANEOUS
  Administered 2018-12-29: 11 [IU] via SUBCUTANEOUS
  Filled 2018-12-28 (×2): qty 1

## 2018-12-28 MED ORDER — NYSTATIN 100000 UNIT/ML MT SUSP
5.0000 mL | Freq: Four times a day (QID) | OROMUCOSAL | Status: DC
  Administered 2018-12-28 – 2019-01-03 (×11): 500000 [IU] via ORAL
  Filled 2018-12-28 (×25): qty 5

## 2018-12-28 MED ORDER — HALOPERIDOL LACTATE 5 MG/ML IJ SOLN
1.0000 mg | Freq: Four times a day (QID) | INTRAMUSCULAR | Status: DC | PRN
  Administered 2018-12-28: 1 mg via INTRAVENOUS
  Filled 2018-12-28: qty 1

## 2018-12-28 MED ORDER — BUDESONIDE 0.5 MG/2ML IN SUSP
0.5000 mg | Freq: Two times a day (BID) | RESPIRATORY_TRACT | Status: DC
  Administered 2018-12-28 – 2019-01-03 (×12): 0.5 mg via RESPIRATORY_TRACT
  Filled 2018-12-28 (×12): qty 2

## 2018-12-28 MED ORDER — MORPHINE SULFATE (PF) 2 MG/ML IV SOLN
INTRAVENOUS | Status: AC
  Filled 2018-12-28: qty 1

## 2018-12-28 MED ORDER — METOPROLOL TARTRATE 5 MG/5ML IV SOLN
2.5000 mg | Freq: Four times a day (QID) | INTRAVENOUS | Status: DC | PRN
  Administered 2018-12-30 (×2): 2.5 mg via INTRAVENOUS
  Filled 2018-12-28 (×3): qty 5

## 2018-12-28 MED ORDER — CHLORHEXIDINE GLUCONATE CLOTH 2 % EX PADS
6.0000 | MEDICATED_PAD | Freq: Every day | CUTANEOUS | Status: DC
  Administered 2018-12-28 – 2019-01-02 (×6): 6 via TOPICAL

## 2018-12-28 NOTE — Progress Notes (Signed)
Anxious and agitated.  Constantly yelling out despite prn meds given. Afib continues on telemetry.  Staff sitting at bedside with calming music with attempts to calm.

## 2018-12-28 NOTE — Progress Notes (Signed)
Progress Note  Patient Name: Summer Bradley Date of Encounter: 12/28/2018  Primary Cardiologist: New-Dr. Saunders Revel  Subjective   Patient states doing okay, shortness of breath has improved compared to day of admission.  Per nursing staff, patient was anxious/agitated overnight.  Received Ativan.  Inpatient Medications    Scheduled Meds: . apixaban  5 mg Oral BID  . bisoprolol  5 mg Oral BID  . busPIRone  15 mg Oral BID  . calcium-vitamin D  1 tablet Oral BID  . citalopram  20 mg Oral QHS  . diltiazem  300 mg Oral Daily  . doxycycline  100 mg Oral Q12H  . famotidine  20 mg Oral BID  . guaiFENesin  600 mg Oral BID  . ipratropium-albuterol  3 mL Nebulization Q6H  . levothyroxine  150 mcg Oral QAC breakfast  . mouth rinse  15 mL Mouth Rinse BID  . methylPREDNISolone (SOLU-MEDROL) injection  60 mg Intravenous Q12H  . mometasone-formoterol  2 puff Inhalation BID  . pravastatin  20 mg Oral q1800  . sodium chloride flush  3 mL Intravenous Q12H  . tiotropium  1 capsule Inhalation Daily   Continuous Infusions: . sodium chloride 250 mL (12/27/18 2018)   PRN Meds: sodium chloride, acetaminophen **OR** acetaminophen, guaiFENesin-codeine, levalbuterol, LORazepam, LORazepam, magnesium hydroxide, polyvinyl alcohol, traZODone   Vital Signs    Vitals:   12/28/18 0845 12/28/18 0856 12/28/18 0937 12/28/18 1008  BP:      Pulse:      Resp:      Temp:      TempSrc:      SpO2: (!) 86% 93% 96% 99%  Weight:      Height:        Intake/Output Summary (Last 24 hours) at 12/28/2018 1020 Last data filed at 12/28/2018 0958 Gross per 24 hour  Intake 360 ml  Output 400 ml  Net -40 ml   Last 3 Weights 12/28/2018 12/27/2018 12/25/2018  Weight (lbs) 209 lb 212 lb 4.8 oz 214 lb 11.2 oz  Weight (kg) 94.802 kg 96.299 kg 97.387 kg      Telemetry    Atrial fibrillation, heart rate 108- Personally Reviewed  ECG    None obtained today- Personally Reviewed  Physical Exam   GEN: No  acute distress.   Neck: No JVD Cardiac:  Distant heart sounds, irregularly irregular,, no murmurs, rubs, or gallops.  Respiratory:  Expiratory wheezing noted bilaterally. GI: Soft, nontender, non-distended  MS: No edema; No deformity. Neuro:  Nonfocal  Psych: Normal affect   Labs    High Sensitivity Troponin:   Recent Labs  Lab 12/23/18 1645 12/24/18 0628 12/25/18 0501  TROPONINIHS 10 8 9       Chemistry Recent Labs  Lab 12/23/18 1645  12/26/18 0439 12/27/18 0506 12/28/18 0607  NA 142   < > 146* 143 144  K 3.7   < > 4.1 4.6 4.2  CL 97*   < > 105 106 106  CO2 32   < > 30 25 26   GLUCOSE 97   < > 132* 206* 189*  BUN 13   < > 33* 37* 42*  CREATININE 1.01*   < > 1.25* 0.99 1.01*  CALCIUM 9.5   < > 9.3 9.6 9.9  PROT 7.4  --   --   --   --   ALBUMIN 3.9  --   --   --   --   AST 27  --   --   --   --  ALT 12  --   --   --   --   ALKPHOS 48  --   --   --   --   BILITOT 1.5*  --   --   --   --   GFRNONAA 55*   < > 42* 56* 55*  GFRAA >60   < > 49* >60 >60  ANIONGAP 13   < > 11 12 12    < > = values in this interval not displayed.     Hematology Recent Labs  Lab 12/26/18 0439 12/27/18 0506 12/28/18 0607  WBC 19.5* 13.6* 19.2*  RBC 4.71 4.89 4.90  HGB 14.6 15.0 15.2*  HCT 45.4 47.7* 45.6  MCV 96.4 97.5 93.1  MCH 31.0 30.7 31.0  MCHC 32.2 31.4 33.3  RDW 14.8 14.8 14.7  PLT 296 289 311    BNP Recent Labs  Lab 12/23/18 1340  BNP 269.0*     DDimer No results for input(s): DDIMER in the last 168 hours.   Radiology    No results found.  Cardiac Studies   Echo 12/25/2018 1. Left ventricular ejection fraction, by visual estimation, is 55 to 60%. The left ventricle has normal function. There is no left ventricular hypertrophy. 2. Global right ventricle has normal systolic function.The right ventricular size is mildly enlarged. No increase in right ventricular wall thickness. 3. Left atrial size was mildly dilated. 4. Tricuspid valve regurgitation  mild-moderate. 5. Moderately elevated pulmonary artery systolic pressure. 6. The tricuspid regurgitant velocity is 2.88 m/s, and with an assumed right atrial pressure of 10 mmHg, the estimated right ventricular systolic pressure is moderately elevated at 43.1 mmHg. 7. Rhythm is atrial fibrillation 8. Challenging image quality  Patient Profile     73 y.o. female with history of paroxysmal atrial fibrillation, COPD on home oxygen, hypertension, presenting with shortness of breath.  Found to be hypoxic and tachycardic with atrial fibrillation.  Assessment & Plan    Heart rate is better controlled, currently 108.  Patient seems extremely anxious and agitated.  This might explain increases in her current heart rate although not too high 108 bpm.  Her last echocardiogram showed normal ejection fraction, normal LA pressures, and mildly increased pulmonary pressures (RA pressure of 7mmHg).    Paroxysmal atrial fibrillation. Heart rate reasonably controlled at 108. Continue oral diltiazem at 300 mg daily, bisoprolol 5 mg twice daily. Continue Eliquis 5 mg twice daily.  COPD, pneumonia, management as per primary service.       Signed, Kate Sable, MD  12/28/2018, 10:20 AM

## 2018-12-28 NOTE — Progress Notes (Signed)
Called rapid response with this patient, patient was transferred to step down unit for increase work of breathing, place on a Bipap. Dr. Leslye Peer also came at bedside to assess patient. Given bedside report to [place], [place]

## 2018-12-28 NOTE — Progress Notes (Signed)
Notify Dr. Leslye Peer about patient's complaints of SOB, very anxious and tachypneic (which she has been all day) previous RN gave IV haldol, will start her with seroquel tonight, since she has not slept last night. SpO2 is 94% in 3L. Respiratory rate is 34, lung sound diminished bilateral and has expiratory wheezing, educate patient to have slow deep breath. Asked if MD if we can do CXR and ABG, order given. Also MD place an order for IV solumedrol and nebs tx. RN will continue to monitor.

## 2018-12-28 NOTE — Progress Notes (Signed)
PT Cancellation Note  Patient Details Name: Summer Bradley MRN: [id number] DOB: April 11, 1945   Cancelled Treatment:    Reason Eval/Treat Not Completed: Other (comment)(PT spoke with RN, reported pt had a anxious/agitated night, and is reporting SOB this AM. Pt also with elevated diastolic BP per chart. RN requesting PT to hold until pt is more appropriate to participate, PT to re-attempt as able.)   Lieutenant Diego PT, DPT 12:36 PM,12/28/18 [phone]

## 2018-12-28 NOTE — Consult Note (Signed)
Name: Summer Bradley MRN: [id number] DOB: [birth year]    ADMISSION DATE:  12/23/2018 CONSULTATION DATE: 12/28/2018  REFERRING MD : Dr. Leslye Peer   CHIEF COMPLAINT: Shortness of Breath   BRIEF PATIENT DESCRIPTION:  73 yo female admitted with acute on chronic hypoxic respiratory failure secondary to AECOPD requiring Bipap   SIGNIFICANT EVENTS/STUDIES:  11/6: Pt admitted to the telemetry unit  11/6: CTA Chest revealed evaluation for pulmonary emboli is limited by motion artifact. Given this limitation, no PE was identified. Cardiomegaly with coronary artery disease. Moderate-sized hiatal hernia. Cholelithiasis without CT evidence for acute cholecystitis. Emphysema and aortic atherosclerosis.Aortic Atherosclerosis (ICD10-I70.0) and Emphysema (ICD10-J43.9). 11/7: CT Head/Cervical Spine revealed small left frontal scalp contusion.  No calvarial fracture. No acute intracranial abnormality. No acute fracture or dislocation of cervical spine. Large chronic right MCA distribution infarction as well as small infarctions of right cerebellar hemisphere and left thalamus. Advanced cervical spondylosis with prominent left-sided facet arthropathy. 11/8: Echo revealed left ventricular ejection fraction, by visual estimation, is 55 to 60%. The left ventricle has normal function. There is no left ventricular hypertrophy. Global right ventricle has normal systolic function.The right ventricular size is mildly enlarged. No increase in right ventricular wall thickness. 11/11: Rapid response initiated due to pt developing worsening acute on chronic respiratory failure and encephalopathy requiring transfer to the stepdown unit for Bipap   CULTURES: Blood x 2 11/8>>negative  Blood 11/8>>1/4 GPC staph species Mec A +, now another 1/4 (2/4 total) GPR  MRSA 11/11>>negative   HISTORY OF PRESENT ILLNESS:   This is a 73 yo female with a PMH of Vertigo, Stroke, Chronic O2 @2L , Orthopnea, Hypothyroidism, HTN, GERD,  Dysrhythmia, Depression, Chronic Cough, COPD, Anxiety, Heart Disease, Dementia, and Atrial Fibrillation.  She presented to [facility] ER on 11/6 with worsening shortness of breath and weakness. Upon arrival to the ER pt tachypneic with intermittent hypoxia O2 sats in the 80's, and heart rate 130's. ER EKG revealed atrial fibrillation, hr 128 with normal axis, and nonspecific ST-T changes, therefore cardizem gtt initiated. COVID-19 negative, however CXR concerning for chronic inflammatory change/fibrosis and mild right base atelectasis. Lab results revealed creatinine 1.01, BNP 269, troponin 10, and d-dimer 1922.75.  She received ceftriaxone.  She was subsequently admitted to the telemetry unit per hospitalist team for additional workup and treatment.  On 11/11 pt became tachypneic respiratory rate 40's with minimal air movement on 3L O2 and also noted to have worsening delirium, therefore rapid response initiated.  Pt subsequently transferred to the stepdown unit per hospitalist team for [place].  PCCM consulted to assist with management.   PAST MEDICAL HISTORY :   has a past medical history of A-fib ([place]), Anxiety, COPD (chronic obstructive pulmonary disease) ([place]), Cough, Depression, Dysrhythmia, GERD (gastroesophageal reflux disease), Heart disease, High cholesterol, Hypertension, Hypothyroidism, Orthopnea, Oxygen dependent, Stroke ([place]), Vertigo, and Wheezing.  has a past surgical history that includes Abdominal surgery; Radiology with anesthesia (N/A, 01/29/2014); Radiology with anesthesia (N/A, 02/01/2014); Tracheostomy (02/15/14); Esophagogastroduodenoscopy (N/A, 03/01/2014); Colonoscopy (N/A, 03/03/2014); enteroscopy (N/A, 03/03/2014); Aneurysm coiling; Aorta surgery; Knee surgery; Tonsillectomy; Brain surgery; Cataract extraction w/PHACO (Right, 05/27/2017); and Cataract extraction w/PHACO (Left, 08/12/2017). Prior to Admission medications   Medication Sig Start Date End Date Taking? Authorizing Provider   albuterol (PROVENTIL HFA;VENTOLIN HFA) 108 (90 BASE) MCG/ACT inhaler Inhale 2 puffs into the lungs every 4 (four) hours as needed for wheezing or shortness of breath.    Yes [provider]  albuterol (PROVENTIL) (2.5 MG/3ML) 0.083% nebulizer solution Take 2.5  mg by nebulization every 6 (six) hours as needed for wheezing or shortness of breath.   Yes [provider]  apixaban (ELIQUIS) 5 MG TABS tablet Take 1 tablet (5 mg total) by mouth 2 (two) times daily. 03/07/14  Yes Rosalin Hawking, MD  bisoprolol (ZEBETA) 5 MG tablet Take 0.5 tablets (2.5 mg total) by mouth 2 (two) times daily. 03/05/14  Yes Rosalin Hawking, MD  budesonide-formoterol [facility]) 160-4.5 MCG/ACT inhaler Inhale 2 puffs into the lungs 2 (two) times daily.   Yes [provider]  busPIRone (BUSPAR) 10 MG tablet Take 10 mg by mouth 2 (two) times daily.   Yes [provider]  Calcium Carbonate-Vitamin D 600-400 MG-UNIT per tablet Take 1 tablet by mouth 2 (two) times daily.    Yes [provider]  citalopram (CELEXA) 40 MG tablet Take 40 mg by mouth at bedtime.  07/04/14  Yes [provider]  diltiazem (CARDIZEM CD) 240 MG 24 hr capsule Take 240 mg by mouth daily.   Yes [provider]  docusate sodium (COLACE) 100 MG capsule Take 100 mg by mouth daily.   Yes [provider]  furosemide (LASIX) 20 MG tablet Take 1 tablet (20 mg total) by mouth 2 (two) times daily. Patient taking differently: Take 40 mg by mouth every morning.  03/05/14  Yes Rosalin Hawking, MD  levothyroxine (SYNTHROID, LEVOTHROID) 150 MCG tablet Take 150 mcg by mouth daily before breakfast.  01/09/14  Yes [provider]  LORazepam (ATIVAN) 0.5 MG tablet Take 0.5 mg by mouth 3 (three) times daily. Take 1 tablet once daily, take 2 tablets at night   Yes [provider]  lovastatin (MEVACOR) 10 MG tablet Take 10 mg by mouth daily.   Yes [provider]  metolazone (ZAROXOLYN) 2.5 MG  tablet Take 2.5 mg by mouth once a week. wednesday   Yes [provider]  Polyethyl Glycol-Propyl Glycol (SYSTANE ULTRA OP) Place 1 drop into both eyes every 4 (four) hours as needed (dry eyes).   Yes [provider]  potassium chloride SA (K-DUR,KLOR-CON) 20 MEQ tablet Take 2 tablets (40 mEq total) by mouth 2 (two) times daily. 03/05/14  Yes Rosalin Hawking, MD  sodium chloride (AYR) 0.65 % nasal spray Place 2 sprays into the nose every 2 (two) hours as needed for congestion.   Yes [provider]  Tiotropium Bromide Monohydrate (SPIRIVA RESPIMAT) 2.5 MCG/ACT AERS Inhale 1 puff into the lungs daily.   Yes [provider]   Allergies  Allergen Reactions  . Lisinopril Cough    FAMILY HISTORY:  family history includes Diabetes in her mother. SOCIAL HISTORY:  reports that she has quit smoking. She has never used smokeless tobacco. She reports that she does not drink alcohol or use drugs.  REVIEW OF SYSTEMS:   Unable to assess pt confused   SUBJECTIVE:  Unable to assess pt confused   VITAL SIGNS: Temp:  [97.7 F (36.5 C)-98.2 F (36.8 C)] 98.2 F (36.8 C) (11/11 1819) Pulse Rate:  [82-116] 109 (11/11 1930) Resp:  [18-34] 30 (11/11 1930) BP: (135-167)/(95-142) 167/142 (11/11 1819) SpO2:  [86 %-99 %] 96 % (11/11 1930) FiO2 (%):  [40 %] 40 % (11/11 1930) Weight:  [94.8 kg-95.4 kg] 95.4 kg (11/11 1819)  PHYSICAL EXAMINATION: General: acutely ill appearing female, in respiratory distress on Bipap  Neuro: confused, not following commands, PERRL  HEENT: JVD present  Cardiovascular: irregular irregular, no R/G  Lungs: diminished throughout, tachypneic, with use accessory muscles  to breath  Abdomen: +BS x4, obese, soft, non tender, non distended  Musculoskeletal: normal bulk and tone, trace generalized edema  Skin: diffuse scattered ecchymosis, bilateral upper extremity skin tears   Recent Labs  Lab 12/26/18 0439 12/27/18 0506 12/28/18 0607  NA 146*  143 144  K 4.1 4.6 4.2  CL 105 106 106  CO2 30 25 26   BUN 33* 37* 42*  CREATININE 1.25* 0.99 1.01*  GLUCOSE 132* 206* 189*   Recent Labs  Lab 12/26/18 0439 12/27/18 0506 12/28/18 0607  HGB 14.6 15.0 15.2*  HCT 45.4 47.7* 45.6  WBC 19.5* 13.6* 19.2*  PLT 296 289 311   Dg Chest Port 1 View  Result Date: 12/28/2018 CLINICAL DATA:  Dyspnea, COPD EXAM: PORTABLE CHEST 1 VIEW COMPARISON:  12/24/2018 chest radiograph. FINDINGS: Stable cardiomediastinal silhouette with mild cardiomegaly. No pneumothorax. No pleural effusion. Emphysema. No overt pulmonary edema. No acute consolidative airspace disease. IMPRESSION: Stable mild cardiomegaly without overt pulmonary edema. No acute pulmonary disease. Emphysema. Electronically Signed   By: Ilona Sorrel M.D.   On: 12/28/2018 17:32    ASSESSMENT / PLAN:  Acute on chronic hypoxic respiratory failure secondary AECOPD  Hx: Cough, Chronic Home O2 @2L , and Orthopnea  Prn Bipap for dyspnea and/or hypoxia  Scheduled and prn bronchodilator therapy  Continue doxycycline  IV lasix x1 dose  Nebulized steroids  Prn morphine for air hunger   Paroxysmal atrial fibrillation  Hx: HTN, Stroke, and HLD Continuous telemetry monitoring  Prn iv metoprolol for heart rate control  Continue outpatient cardiac medications once pt able to tolerate po  Cardiology consulted appreciate input   Leukocytosis: 2/4 Blood Cultures positive for staph species Mec A possible contaminant  Trend WBC and monitor fever curve Trend PCT Follow cultures  Abx : Ceftriaxone 11/6>>11/10 Vancomycin 11/7>>11/8 Doxycycline 11/9>>  Hyperglycemia  CBG's q4hrs  SSI   Acute encephalopathy pt with underlying dementia  Frequent reorientation  Will start precedex gtt for agitation   Pts daughter [name] Reitzel at bedside with pt after discussing goals of treatment and pt prognosis with ICU Intensivist Dr. Galen Daft pts daughter has decided to change pts code status to DNR/DNI    Marda Stalker, [place] Pager [phone] (please enter 7 digits) PCCM Consult Pager [phone] (please enter 7 digits)

## 2018-12-28 NOTE — Progress Notes (Signed)
OT Cancellation Note  Patient Details Name: Summer Bradley MRN: [id number] DOB: 1946/01/10   Cancelled Treatment:    Reason Eval/Treat Not Completed: Medical issues which prohibited therapy OT reviewed chart and noted that pt's HR improved this AM. However, upon speaking with nurse, pt anxious and awake throughout the night with fluctuating heart rate with improvement after administration of ativan this AM. RN asks to withhold therapy at this time stating pt groggy. Will withhold and f/u as able when pt becomes more appropriate.   Gerrianne Scale, MS, OTR/L ascom [phone] or [phone] 12/28/18, 9:44 AM

## 2018-12-28 NOTE — Significant Event (Signed)
Rapid Response Event Note  Overview:  RRT called to room 236 at 1752. Arrived to patients room and patient found to be in respiratory distress. RN at bedside, notified that MD had been paged to room.    Initial Focused Assessment: Tachypnea, confused. O2 saturations 98 on 3L [place]. Diminished lung sounds bilaterally with minimal air movement.  Interventions: IV solu-medrol given per MD Weiting order ABG drawn Transfer patient to ICU 03  Plan of Care (if not transferred):  Event Summary: Name of Physician Notified: Weiting at 1752    at    Outcome: Transferred (Comment)(ICU-03)  Event End Time: [place]

## 2018-12-28 NOTE — Progress Notes (Signed)
Patient ID: Summer [place], female   DOB: 01/04/1946, 73 y.o.   MRN: [id number] [facility] PROGRESS NOTE  Summer [place] [id number] DOB: 1945/10/25 DOA: 12/23/2018 PCP: [facility]  HPI/Subjective: Patient answers some questions.  Daughter states that she has been confused.  As per nursing staff she has not slept in the past few nights and was agitated overnight.  Objective: Vitals:   12/28/18 1334 12/28/18 1347  BP: (!) 156/96   Pulse:    Resp:    Temp:    SpO2:  92%    Filed Weights   12/25/18 0532 12/27/18 0606 12/28/18 0302  Weight: 97.4 kg 96.3 kg 94.8 kg    ROS: Review of Systems  Constitutional: Negative for chills and fever.  Eyes: Negative for blurred vision.  Respiratory: Positive for cough and shortness of breath.   Cardiovascular: Negative for chest pain.  Gastrointestinal: Negative for abdominal pain, constipation, diarrhea, nausea and vomiting.  Genitourinary: Negative for dysuria.  Musculoskeletal: Negative for joint pain.  Neurological: Negative for dizziness and headaches.   Exam: Physical Exam  Constitutional: She is oriented to person, place, and time.  HENT:  Nose: No mucosal edema.  Mouth/Throat: No oropharyngeal exudate or posterior oropharyngeal edema.  Eyes: Pupils are equal, round, and reactive to light. Conjunctivae, EOM and lids are normal.  Neck: No JVD present. Carotid bruit is not present. No edema present. No thyroid mass and no thyromegaly present.  Cardiovascular: S1 normal and S2 normal. Exam reveals no gallop.  No murmur heard. Pulses:      Dorsalis pedis pulses are 2+ on the right side and 2+ on the left side.  Respiratory: No respiratory distress. She has decreased breath sounds in the right lower field and the left lower field. She has no wheezes. She has no rhonchi. She has no rales.  GI: Soft. Bowel sounds are normal. There is no abdominal tenderness.  Musculoskeletal:     Right ankle: She  exhibits swelling.     Left ankle: She exhibits swelling.  Lymphadenopathy:    She has no cervical adenopathy.  Neurological: She is alert and oriented to person, place, and time. No cranial nerve deficit.  Skin: Skin is warm. Nails show no clubbing.  Bruising bilateral arms and legs.  Psychiatric: She has a normal mood and affect.      Data Reviewed: Basic Metabolic Panel: Recent Labs  Lab 12/23/18 1645 12/24/18 0628 12/26/18 0439 12/27/18 0506 12/28/18 0607  NA 142 140 146* 143 144  K 3.7 3.5 4.1 4.6 4.2  CL 97* 100 105 106 106  CO2 32 26 30 25 26   GLUCOSE 97 195* 132* 206* 189*  BUN 13 15 33* 37* 42*  CREATININE 1.01* 0.92 1.25* 0.99 1.01*  CALCIUM 9.5 8.6* 9.3 9.6 9.9   Liver Function Tests: Recent Labs  Lab 12/23/18 1645  AST 27  ALT 12  ALKPHOS 48  BILITOT 1.5*  PROT 7.4  ALBUMIN 3.9   No results for input(s): LIPASE, AMYLASE in the last 168 hours. No results for input(s): AMMONIA in the last 168 hours. CBC: Recent Labs  Lab 12/23/18 1340 12/24/18 0628 12/26/18 0439 12/27/18 0506 12/28/18 0607  WBC 10.5 9.9 19.5* 13.6* 19.2*  NEUTROABS  --   --  16.7* 12.6* 17.5*  HGB 15.8* 14.6 14.6 15.0 15.2*  HCT 49.6* 46.4* 45.4 47.7* 45.6  MCV 96.7 98.1 96.4 97.5 93.1  PLT 219 240 296 289 311   BNP (last 3 results) Recent  Labs    02/03/18 0611 12/23/18 1340  BNP 183.0* 269.0*     CBG: Recent Labs  Lab 12/24/18 1348  GLUCAP 202*    Recent Results (from the past 240 hour(s))  SARS CORONAVIRUS 2 (TAT 6-24 HRS) Nasopharyngeal Nasopharyngeal Swab     Status: None   Collection Time: 12/23/18 10:30 PM   Specimen: Nasopharyngeal Swab  Result Value Ref Range Status   SARS Coronavirus 2 NEGATIVE NEGATIVE Final    Comment: (NOTE) SARS-CoV-2 target nucleic acids are NOT DETECTED. The SARS-CoV-2 RNA is generally detectable in upper and lower respiratory specimens during the acute phase of infection. Negative results do not preclude SARS-CoV-2  infection, do not rule out co-infections with other pathogens, and should not be used as the sole basis for treatment or other patient management decisions. Negative results must be combined with clinical observations, patient history, and epidemiological information. The expected result is Negative. Fact Sheet for Patients: SugarRoll.be Fact Sheet for Healthcare Providers: https://www.woods-mathews.com/ This test is not yet approved or cleared by the [place] FDA and  has been authorized for detection and/or diagnosis of SARS-CoV-2 by FDA under an Emergency Use Authorization (EUA). This EUA will remain  in effect (meaning this test can be used) for the duration of the COVID-19 declaration under Section 56 4(b)(1) of the Act, 21 U.S.C. section 360bbb-3(b)(1), unless the authorization is terminated or revoked sooner. Performed at [facility], [place] [address] [place], [place] [address]   Culture, blood (routine x 2) Call MD if unable to obtain prior to antibiotics being given     Status: Abnormal   Collection Time: 12/24/18  1:42 AM   Specimen: BLOOD  Result Value Ref Range Status   Specimen Description   Final    BLOOD RIGHT FOREARM Performed at [facility], [address], [place], [place] [address]    Special Requests   Final    BOTTLES DRAWN AEROBIC AND ANAEROBIC Blood Culture adequate volume Performed at [facility], [address] [place], [place] [address]    Culture  Setup Time   Final    AEROBIC BOTTLE ONLY GRAM POSITIVE COCCI CRITICAL RESULT CALLED TO, READ BACK BY AND VERIFIED WITH: ROBBINS,J AT 2046 ON 12/24/2018 BY MOSLEY,J GRAM POSITIVE RODS ANAEROBIC BOTTLE ONLY CRITICAL RESULT CALLED TO, READ BACK BY AND VERIFIED WITH: DAVID BESANTI AT [place] ON 12/25/18 RWW    Culture (A)  Final    STAPHYLOCOCCUS SPECIES (COAGULASE NEGATIVE) THE SIGNIFICANCE OF ISOLATING THIS ORGANISM FROM A SINGLE SET OF  BLOOD CULTURES WHEN MULTIPLE SETS ARE DRAWN IS UNCERTAIN. PLEASE NOTIFY THE MICROBIOLOGY DEPARTMENT WITHIN ONE WEEK IF SPECIATION AND SENSITIVITIES ARE REQUIRED. DIPHTHEROIDS(CORYNEBACTERIUM SPECIES) Standardized susceptibility testing for this organism is not available. Performed at [facility], [place] [address], [place], [place] [address]    Report Status 12/26/2018 FINAL  Final  Blood Culture ID Panel (Reflexed)     Status: Abnormal   Collection Time: 12/24/18  1:42 AM  Result Value Ref Range Status   Enterococcus species NOT DETECTED NOT DETECTED Final   Listeria monocytogenes NOT DETECTED NOT DETECTED Final   Staphylococcus species DETECTED (A) NOT DETECTED Final    Comment: Methicillin (oxacillin) resistant coagulase negative staphylococcus. Possible blood culture contaminant (unless isolated from more than one blood culture draw or clinical case suggests pathogenicity). No antibiotic treatment is indicated for blood  culture contaminants. CRITICAL RESULT CALLED TO, READ BACK BY AND VERIFIED WITH: ROBBINS,J AT 2046 ON 12/24/2018 BY  MOSLEY,J    Staphylococcus aureus (BCID) NOT DETECTED NOT DETECTED Final   Methicillin resistance DETECTED (A) NOT DETECTED Final    Comment: CRITICAL RESULT CALLED TO, READ BACK BY AND VERIFIED WITH: ROBBINS,J AT 2046 ON 12/24/2018 BY MOSLEY,J    Streptococcus species NOT DETECTED NOT DETECTED Final   Streptococcus agalactiae NOT DETECTED NOT DETECTED Final   Streptococcus pneumoniae NOT DETECTED NOT DETECTED Final   Streptococcus pyogenes NOT DETECTED NOT DETECTED Final   Acinetobacter baumannii NOT DETECTED NOT DETECTED Final   Enterobacteriaceae species NOT DETECTED NOT DETECTED Final   Enterobacter cloacae complex NOT DETECTED NOT DETECTED Final   Escherichia coli NOT DETECTED NOT DETECTED Final   Klebsiella oxytoca NOT DETECTED NOT DETECTED Final   Klebsiella pneumoniae NOT DETECTED NOT DETECTED Final   Proteus species NOT DETECTED NOT  DETECTED Final   Serratia marcescens NOT DETECTED NOT DETECTED Final   Haemophilus influenzae NOT DETECTED NOT DETECTED Final   Neisseria meningitidis NOT DETECTED NOT DETECTED Final   Pseudomonas aeruginosa NOT DETECTED NOT DETECTED Final   Candida albicans NOT DETECTED NOT DETECTED Final   Candida glabrata NOT DETECTED NOT DETECTED Final   Candida krusei NOT DETECTED NOT DETECTED Final   Candida parapsilosis NOT DETECTED NOT DETECTED Final   Candida tropicalis NOT DETECTED NOT DETECTED Final    Comment: Performed at [facility], [address] [place], [place] [address]  Culture, blood (routine x 2)     Status: None (Preliminary result)   Collection Time: 12/25/18  9:25 AM   Specimen: BLOOD  Result Value Ref Range Status   Specimen Description BLOOD RIGHT ANTECUBITAL  Final   Special Requests   Final    BOTTLES DRAWN AEROBIC AND ANAEROBIC Blood Culture results may not be optimal due to an inadequate volume of blood received in culture bottles   Culture   Final    NO GROWTH 3 DAYS Performed at [facility], [address] [place], [place] [address]    Report Status PENDING  Incomplete  Culture, blood (routine x 2)     Status: None (Preliminary result)   Collection Time: 12/25/18  9:34 AM   Specimen: BLOOD  Result Value Ref Range Status   Specimen Description BLOOD BLOOD RIGHT FOREARM  Final   Special Requests   Final    BOTTLES DRAWN AEROBIC AND ANAEROBIC Blood Culture adequate volume   Culture   Final    NO GROWTH 3 DAYS Performed at [facility], [address] [place], [place] [address]    Report Status PENDING  Incomplete      Scheduled Meds: . apixaban  5 mg Oral BID  . bisoprolol  5 mg Oral BID  . busPIRone  15 mg Oral BID  . calcium-vitamin D  1 tablet Oral BID  . citalopram  20 mg Oral QHS  . diltiazem  300 mg Oral Daily  . doxycycline  100 mg Oral Q12H  . famotidine  20 mg Oral BID  . guaiFENesin  600 mg Oral BID  .  ipratropium-albuterol  3 mL Nebulization Q6H  . levothyroxine  150 mcg Oral QAC breakfast  . mouth rinse  15 mL Mouth Rinse BID  . mometasone-formoterol  2 puff Inhalation BID  . nystatin  5 mL Oral QID  . pravastatin  20 mg Oral q1800  . [START ON 12/29/2018] predniSONE  30 mg Oral Q breakfast  . QUEtiapine  25 mg Oral QHS  . sodium chloride flush  3  mL Intravenous Q12H  . tiotropium  1 capsule Inhalation Daily   Continuous Infusions: . sodium chloride 250 mL (12/27/18 2018)    Assessment/Plan:  1. Acute delirium with underlying dementia.  The patient has not slept in a few nights.  Start Seroquel 25 mg nightly.  As needed Haldol IV.  Stop IV Ativan.  Patient does take BuSpar Celexa and as needed oral Ativan as outpatient. 2. COPD exacerbation.  Discontinue Solu-Medrol and start oral prednisone starting tomorrow.  This could be contributing to the patient's altered mental status.  Continue nebulizers and inhalers.  Patient on doxycycline. 3. Chronic hypoxic respiratory failure on 3 L of oxygen.  Advised nursing staff not to go up higher than 3 L.  Rather have more hypoxic with pulse ox of 88%. 4. Atrial fibrillation with rapid ventricular response.  Patient was on Cardizem drip and now over to bisoprolol and Cardizem CD.  Patient on Eliquis for anticoagulation. 5. Blood culture with corynebacterium is likely a skin contamination. 6. Hypertension on Cardizem and bisoprolol 7. Hypothyroidism unspecified on Synthroid 8. History of ischemic stroke on Eliquis and lovastatin 9. Anxiety and depression on Celexa  Code Status:     Code Status Orders  (From admission, onward)         Start     Ordered   12/23/18 2226  Full code  Continuous     12/23/18 2233        Code Status History    Date Active Date Inactive Code Status Order ID Comments User Context   02/03/2018 0934 02/10/2018 1954 Full Code [id number]  Arta Silence, MD Inpatient   02/02/2014 0933 03/05/2014 1624 Full  Code [id number]  Wilhelmina Mcardle, MD Inpatient   02/01/2014 1236 02/02/2014 0933 Full Code [id number]  Rob Hickman, MD Inpatient   01/29/2014 1119 02/01/2014 1236 Full Code [id number]  Roland Rack, MD Inpatient   Advance Care Planning Activity    Advance Directive Documentation     Most Recent Value  Type of Advance Directive  Healthcare Power of Attorney  Pre-existing out of facility DNR order (yellow form or pink MOST form)  -  "MOST" Form in Place?  -     Family Communication: Spoke with daughter on the phone Disposition Plan: To be determined  Time spent: 28 minutes  [place]

## 2018-12-28 NOTE — Progress Notes (Signed)
Patient ID: Summer [place], female   DOB: 06/20/1945, 73 y.o.   MRN: [id number] [facility] PROGRESS NOTE  Summer [place] [id number] DOB: 1945/05/08 DOA: 12/23/2018 PCP: [place]  HPI/Subjective: Patient states her breathing is terrible.  Objective: Vitals:   12/28/18 1347 12/28/18 1521  BP:  (!) 151/105  Pulse:  82  Resp:  20  Temp:  98 F (36.7 C)  SpO2: 92% 93%    Filed Weights   12/25/18 0532 12/27/18 0606 12/28/18 0302  Weight: 97.4 kg 96.3 kg 94.8 kg    ROS: Review of Systems  Unable to perform ROS: Acuity of condition  Respiratory: Positive for cough and shortness of breath.    Exam: Physical Exam  Constitutional: She is oriented to person, place, and time.  HENT:  Nose: No mucosal edema.  Mouth/Throat: No oropharyngeal exudate or posterior oropharyngeal edema.  Eyes: Pupils are equal, round, and reactive to light. Conjunctivae, EOM and lids are normal.  Neck: No JVD present. Carotid bruit is not present. No edema present. No thyroid mass and no thyromegaly present.  Cardiovascular: S1 normal and S2 normal. Exam reveals no gallop.  No murmur heard. Pulses:      Dorsalis pedis pulses are 2+ on the right side and 2+ on the left side.  Respiratory: She is in respiratory distress. She has decreased breath sounds in the right middle field, the right lower field and the left lower field. She has wheezes in the right middle field and the left middle field. She has no rhonchi. She has no rales.  GI: Soft. Bowel sounds are normal. There is no abdominal tenderness.  Musculoskeletal:     Right ankle: She exhibits swelling.     Left ankle: She exhibits swelling.  Lymphadenopathy:    She has no cervical adenopathy.  Neurological: She is alert and oriented to person, place, and time. No cranial nerve deficit.  Skin: Skin is warm. Nails show no clubbing.  Bruising bilateral arms and legs.  Psychiatric: She has a normal mood and affect.       Data Reviewed: Basic Metabolic Panel: Recent Labs  Lab 12/23/18 1645 12/24/18 0628 12/26/18 0439 12/27/18 0506 12/28/18 0607  NA 142 140 146* 143 144  K 3.7 3.5 4.1 4.6 4.2  CL 97* 100 105 106 106  CO2 32 26 30 25 26   GLUCOSE 97 195* 132* 206* 189*  BUN 13 15 33* 37* 42*  CREATININE 1.01* 0.92 1.25* 0.99 1.01*  CALCIUM 9.5 8.6* 9.3 9.6 9.9   Liver Function Tests: Recent Labs  Lab 12/23/18 1645  AST 27  ALT 12  ALKPHOS 48  BILITOT 1.5*  PROT 7.4  ALBUMIN 3.9    CBC: Recent Labs  Lab 12/23/18 1340 12/24/18 0628 12/26/18 0439 12/27/18 0506 12/28/18 0607  WBC 10.5 9.9 19.5* 13.6* 19.2*  NEUTROABS  --   --  16.7* 12.6* 17.5*  HGB 15.8* 14.6 14.6 15.0 15.2*  HCT 49.6* 46.4* 45.4 47.7* 45.6  MCV 96.7 98.1 96.4 97.5 93.1  PLT 219 240 296 289 311   BNP (last 3 results) Recent Labs    02/03/18 0611 12/23/18 1340  BNP 183.0* 269.0*     CBG: Recent Labs  Lab 12/24/18 1348  GLUCAP 202*    Recent Results (from the past 240 hour(s))  SARS CORONAVIRUS 2 (TAT 6-24 HRS) Nasopharyngeal Nasopharyngeal Swab     Status: None   Collection Time: 12/23/18 10:30 PM   Specimen: Nasopharyngeal Swab  Result  Value Ref Range Status   SARS Coronavirus 2 NEGATIVE NEGATIVE Final    Comment: (NOTE) SARS-CoV-2 target nucleic acids are NOT DETECTED. The SARS-CoV-2 RNA is generally detectable in upper and lower respiratory specimens during the acute phase of infection. Negative results do not preclude SARS-CoV-2 infection, do not rule out co-infections with other pathogens, and should not be used as the sole basis for treatment or other patient management decisions. Negative results must be combined with clinical observations, patient history, and epidemiological information. The expected result is Negative. Fact Sheet for Patients: SugarRoll.be Fact Sheet for Healthcare Providers: https://www.woods-mathews.com/ This test is  not yet approved or cleared by the [place] FDA and  has been authorized for detection and/or diagnosis of SARS-CoV-2 by FDA under an Emergency Use Authorization (EUA). This EUA will remain  in effect (meaning this test can be used) for the duration of the COVID-19 declaration under Section 56 4(b)(1) of the Act, 21 U.S.C. section 360bbb-3(b)(1), unless the authorization is terminated or revoked sooner. Performed at [facility], [place] [address], [place], [place] [address]   Culture, blood (routine x 2) Call MD if unable to obtain prior to antibiotics being given     Status: Abnormal   Collection Time: 12/24/18  1:42 AM   Specimen: BLOOD  Result Value Ref Range Status   Specimen Description   Final    BLOOD RIGHT FOREARM Performed at [facility], [address], [place], [place] [address]    Special Requests   Final    BOTTLES DRAWN AEROBIC AND ANAEROBIC Blood Culture adequate volume Performed at [facility], [address] [place], [place] [address]    Culture  Setup Time   Final    AEROBIC BOTTLE ONLY GRAM POSITIVE COCCI CRITICAL RESULT CALLED TO, READ BACK BY AND VERIFIED WITH: ROBBINS,J AT 2046 ON 12/24/2018 BY MOSLEY,J GRAM POSITIVE RODS ANAEROBIC BOTTLE ONLY CRITICAL RESULT CALLED TO, READ BACK BY AND VERIFIED WITH: DAVID BESANTI AT [place] ON 12/25/18 RWW    Culture (A)  Final    STAPHYLOCOCCUS SPECIES (COAGULASE NEGATIVE) THE SIGNIFICANCE OF ISOLATING THIS ORGANISM FROM A SINGLE SET OF BLOOD CULTURES WHEN MULTIPLE SETS ARE DRAWN IS UNCERTAIN. PLEASE NOTIFY THE MICROBIOLOGY DEPARTMENT WITHIN ONE WEEK IF SPECIATION AND SENSITIVITIES ARE REQUIRED. DIPHTHEROIDS(CORYNEBACTERIUM SPECIES) Standardized susceptibility testing for this organism is not available. Performed at [facility], [place] [address], [place], [place] [address]    Report Status 12/26/2018 FINAL  Final  Blood Culture ID Panel (Reflexed)     Status: Abnormal   Collection Time:  12/24/18  1:42 AM  Result Value Ref Range Status   Enterococcus species NOT DETECTED NOT DETECTED Final   Listeria monocytogenes NOT DETECTED NOT DETECTED Final   Staphylococcus species DETECTED (A) NOT DETECTED Final    Comment: Methicillin (oxacillin) resistant coagulase negative staphylococcus. Possible blood culture contaminant (unless isolated from more than one blood culture draw or clinical case suggests pathogenicity). No antibiotic treatment is indicated for blood  culture contaminants. CRITICAL RESULT CALLED TO, READ BACK BY AND VERIFIED WITH: ROBBINS,J AT 2046 ON 12/24/2018 BY MOSLEY,J    Staphylococcus aureus (BCID) NOT DETECTED NOT DETECTED Final   Methicillin resistance DETECTED (A) NOT DETECTED Final    Comment: CRITICAL RESULT CALLED TO, READ BACK BY AND VERIFIED WITH: ROBBINS,J AT 2046 ON 12/24/2018 BY MOSLEY,J    Streptococcus species NOT DETECTED NOT DETECTED Final   Streptococcus agalactiae NOT DETECTED NOT DETECTED Final   Streptococcus pneumoniae NOT  DETECTED NOT DETECTED Final   Streptococcus pyogenes NOT DETECTED NOT DETECTED Final   Acinetobacter baumannii NOT DETECTED NOT DETECTED Final   Enterobacteriaceae species NOT DETECTED NOT DETECTED Final   Enterobacter cloacae complex NOT DETECTED NOT DETECTED Final   Escherichia coli NOT DETECTED NOT DETECTED Final   Klebsiella oxytoca NOT DETECTED NOT DETECTED Final   Klebsiella pneumoniae NOT DETECTED NOT DETECTED Final   Proteus species NOT DETECTED NOT DETECTED Final   Serratia marcescens NOT DETECTED NOT DETECTED Final   Haemophilus influenzae NOT DETECTED NOT DETECTED Final   Neisseria meningitidis NOT DETECTED NOT DETECTED Final   Pseudomonas aeruginosa NOT DETECTED NOT DETECTED Final   Candida albicans NOT DETECTED NOT DETECTED Final   Candida glabrata NOT DETECTED NOT DETECTED Final   Candida krusei NOT DETECTED NOT DETECTED Final   Candida parapsilosis NOT DETECTED NOT DETECTED Final   Candida tropicalis  NOT DETECTED NOT DETECTED Final    Comment: Performed at [facility], [address] [place], [place] [address]  Culture, blood (routine x 2)     Status: None (Preliminary result)   Collection Time: 12/25/18  9:25 AM   Specimen: BLOOD  Result Value Ref Range Status   Specimen Description BLOOD RIGHT ANTECUBITAL  Final   Special Requests   Final    BOTTLES DRAWN AEROBIC AND ANAEROBIC Blood Culture results may not be optimal due to an inadequate volume of blood received in culture bottles   Culture   Final    NO GROWTH 3 DAYS Performed at [facility], [address] [place], [place] [address]    Report Status PENDING  Incomplete  Culture, blood (routine x 2)     Status: None (Preliminary result)   Collection Time: 12/25/18  9:34 AM   Specimen: BLOOD  Result Value Ref Range Status   Specimen Description BLOOD BLOOD RIGHT FOREARM  Final   Special Requests   Final    BOTTLES DRAWN AEROBIC AND ANAEROBIC Blood Culture adequate volume   Culture   Final    NO GROWTH 3 DAYS Performed at [facility], [address] [place], [place] [address]    Report Status PENDING  Incomplete      Scheduled Meds: . apixaban  5 mg Oral BID  . bisoprolol  5 mg Oral BID  . budesonide (PULMICORT) nebulizer solution  0.5 mg Nebulization BID  . busPIRone  15 mg Oral BID  . calcium-vitamin D  1 tablet Oral BID  . citalopram  20 mg Oral QHS  . diltiazem  300 mg Oral Daily  . doxycycline  100 mg Oral Q12H  . famotidine  20 mg Oral BID  . guaiFENesin  600 mg Oral BID  . ipratropium-albuterol  3 mL Nebulization Q6H  . levothyroxine  150 mcg Oral QAC breakfast  . mouth rinse  15 mL Mouth Rinse BID  . methylPREDNISolone (SOLU-MEDROL) injection  40 mg Intravenous Daily  . nystatin  5 mL Oral QID  . pravastatin  20 mg Oral q1800  . QUEtiapine  25 mg Oral QHS  . sodium chloride flush  3 mL Intravenous Q12H  . tiotropium  1 capsule Inhalation Daily   Continuous  Infusions: . sodium chloride 250 mL (12/27/18 2018)    Assessment/Plan:  1. Acute on chronic hypoxic respiratory failure.  Chest x-ray negative for pneumonia or fluid.  Patient not moving much air.  Transferred to ICU stepdown for BiPAP.  Critical care consultation.  Restart Solu-Medrol.  Wears baseline  3 L of oxygen. 2. Acute delirium with underlying dementia.  The patient has not slept in a few nights.  Start Seroquel 25 mg nightly.  As needed Haldol IV.  Patient does take BuSpar Celexa and as needed oral Ativan as outpatient. 3. COPD exacerbation.  Restart Solu-Medrol continue nebulizers and inhalers.  Patient on doxycycline. 4. Paroxysmal atrial fibrillation with rapid ventricular response.  Patient was on Cardizem drip and now over to bisoprolol and Cardizem CD.  Patient on Eliquis for anticoagulation. 5. Blood culture with corynebacterium is likely a skin contamination. 6. Hypertension on Cardizem and bisoprolol 7. Hypothyroidism unspecified on Synthroid 8. History of ischemic stroke on Eliquis and lovastatin 9. Anxiety and depression on Celexa  Code Status:     Code Status Orders  (From admission, onward)         Start     Ordered   12/23/18 2226  Full code  Continuous     12/23/18 2233        Code Status History    Date Active Date Inactive Code Status Order ID Comments User Context   02/03/2018 0934 02/10/2018 1954 Full Code [id number]  Arta Silence, MD Inpatient   02/02/2014 0933 03/05/2014 1624 Full Code [id number]  Wilhelmina Mcardle, MD Inpatient   02/01/2014 1236 02/02/2014 0933 Full Code [id number]  Rob Hickman, MD Inpatient   01/29/2014 1119 02/01/2014 1236 Full Code [id number]  Roland Rack, MD Inpatient   Advance Care Planning Activity    Advance Directive Documentation     Most Recent Value  Type of Advance Directive  Healthcare Power of Attorney  Pre-existing out of facility DNR order (yellow form or pink MOST form)  -  "MOST" Form in  Place?  -     Family Communication: Spoke with daughter at the bedside Disposition Plan: To be determined  Time spent: Another 15 minutes, case discussed with critical care specialist  [place] [facility]  [facility]

## 2018-12-28 NOTE — Progress Notes (Signed)
Spoke with respiratory to make aware patient sat ranging 86-90 on 3l. History of COPD. Patient very agitated. Ativan prn was given. Per respiratory increase 02 to 4l. Does not recommend further respiratory interventions. Will continue to monitor. Dr. Leslye [place] on the floor will follow up with md regarding patients status.

## 2018-12-29 DIAGNOSIS — F329 Major depressive disorder, single episode, unspecified: Secondary | ICD-10-CM

## 2018-12-29 DIAGNOSIS — F419 Anxiety disorder, unspecified: Secondary | ICD-10-CM

## 2018-12-29 DIAGNOSIS — Z7189 Other specified counseling: Secondary | ICD-10-CM

## 2018-12-29 DIAGNOSIS — R410 Disorientation, unspecified: Secondary | ICD-10-CM

## 2018-12-29 DIAGNOSIS — J9620 Acute and chronic respiratory failure, unspecified whether with hypoxia or hypercapnia: Secondary | ICD-10-CM

## 2018-12-29 DIAGNOSIS — Z515 Encounter for palliative care: Secondary | ICD-10-CM

## 2018-12-29 DIAGNOSIS — J441 Chronic obstructive pulmonary disease with (acute) exacerbation: Secondary | ICD-10-CM

## 2018-12-29 DIAGNOSIS — R0602 Shortness of breath: Secondary | ICD-10-CM

## 2018-12-29 DIAGNOSIS — I4891 Unspecified atrial fibrillation: Secondary | ICD-10-CM

## 2018-12-29 LAB — CBC WITH DIFFERENTIAL/PLATELET
Abs Immature Granulocytes: 0.31 10*3/uL — ABNORMAL HIGH (ref 0.00–0.07)
Basophils Absolute: 0 10*3/uL (ref 0.0–0.1)
Basophils Relative: 0 %
Eosinophils Absolute: 0 10*3/uL (ref 0.0–0.5)
Eosinophils Relative: 0 %
HCT: 45.2 % (ref 36.0–46.0)
Hemoglobin: 14.7 g/dL (ref 12.0–15.0)
Immature Granulocytes: 2 %
Lymphocytes Relative: 3 %
Lymphs Abs: 0.4 10*3/uL — ABNORMAL LOW (ref 0.7–4.0)
MCH: 30.6 pg (ref 26.0–34.0)
MCHC: 32.5 g/dL (ref 30.0–36.0)
MCV: 94 fL (ref 80.0–100.0)
Monocytes Absolute: 0.6 10*3/uL (ref 0.1–1.0)
Monocytes Relative: 4 %
Neutro Abs: 12.3 10*3/uL — ABNORMAL HIGH (ref 1.7–7.7)
Neutrophils Relative %: 91 %
Platelets: 253 10*3/uL (ref 150–400)
RBC: 4.81 MIL/uL (ref 3.87–5.11)
RDW: 14.6 % (ref 11.5–15.5)
WBC: 13.6 10*3/uL — ABNORMAL HIGH (ref 4.0–10.5)
nRBC: 0 % (ref 0.0–0.2)

## 2018-12-29 LAB — GLUCOSE, CAPILLARY
Glucose-Capillary: 109 mg/dL — ABNORMAL HIGH (ref 70–99)
Glucose-Capillary: 128 mg/dL — ABNORMAL HIGH (ref 70–99)
Glucose-Capillary: 148 mg/dL — ABNORMAL HIGH (ref 70–99)
Glucose-Capillary: 149 mg/dL — ABNORMAL HIGH (ref 70–99)
Glucose-Capillary: 179 mg/dL — ABNORMAL HIGH (ref 70–99)
Glucose-Capillary: 234 mg/dL — ABNORMAL HIGH (ref 70–99)
Glucose-Capillary: 320 mg/dL — ABNORMAL HIGH (ref 70–99)

## 2018-12-29 LAB — PROTIME-INR
INR: 1.3 — ABNORMAL HIGH (ref 0.8–1.2)
Prothrombin Time: 16.4 seconds — ABNORMAL HIGH (ref 11.4–15.2)

## 2018-12-29 LAB — BASIC METABOLIC PANEL
Anion gap: 11 (ref 5–15)
BUN: 44 mg/dL — ABNORMAL HIGH (ref 8–23)
CO2: 30 mmol/L (ref 22–32)
Calcium: 9.2 mg/dL (ref 8.9–10.3)
Chloride: 104 mmol/L (ref 98–111)
Creatinine, Ser: 1.07 mg/dL — ABNORMAL HIGH (ref 0.44–1.00)
GFR calc Af Amer: 59 mL/min — ABNORMAL LOW (ref >60)
GFR calc non Af Amer: 51 mL/min — ABNORMAL LOW (ref >60)
Glucose, Bld: 221 mg/dL — ABNORMAL HIGH (ref 70–99)
Potassium: 3.4 mmol/L — ABNORMAL LOW (ref 3.5–5.1)
Sodium: 145 mmol/L (ref 135–145)

## 2018-12-29 LAB — APTT: aPTT: 29 seconds (ref 24–36)

## 2018-12-29 LAB — HEPARIN LEVEL (UNFRACTIONATED): Heparin Unfractionated: 2.54 IU/mL — ABNORMAL HIGH (ref 0.30–0.70)

## 2018-12-29 LAB — PHOSPHORUS: Phosphorus: 3.8 mg/dL (ref 2.5–4.6)

## 2018-12-29 LAB — MAGNESIUM: Magnesium: 2.5 mg/dL — ABNORMAL HIGH (ref 1.7–2.4)

## 2018-12-29 LAB — BRAIN NATRIURETIC PEPTIDE: B Natriuretic Peptide: 518 pg/mL — ABNORMAL HIGH (ref 0.0–100.0)

## 2018-12-29 MED ORDER — INSULIN ASPART 100 UNIT/ML ~~LOC~~ SOLN
0.0000 [IU] | SUBCUTANEOUS | Status: DC
  Administered 2018-12-29: 2 [IU] via SUBCUTANEOUS
  Administered 2018-12-29: 4 [IU] via SUBCUTANEOUS
  Administered 2018-12-29: 3 [IU] via SUBCUTANEOUS
  Administered 2018-12-29: 7 [IU] via SUBCUTANEOUS
  Administered 2018-12-30 – 2019-01-01 (×7): 3 [IU] via SUBCUTANEOUS
  Administered 2019-01-02: 4 [IU] via SUBCUTANEOUS
  Administered 2019-01-02 – 2019-01-03 (×5): 3 [IU] via SUBCUTANEOUS
  Filled 2018-12-29 (×17): qty 1

## 2018-12-29 MED ORDER — MORPHINE SULFATE (PF) 2 MG/ML IV SOLN
2.0000 mg | Freq: Once | INTRAVENOUS | Status: AC
  Administered 2018-12-29: 2 mg via INTRAVENOUS

## 2018-12-29 MED ORDER — LORAZEPAM 2 MG/ML IJ SOLN
0.5000 mg | Freq: Three times a day (TID) | INTRAMUSCULAR | Status: DC
  Administered 2018-12-29 – 2019-01-01 (×10): 0.5 mg via INTRAVENOUS
  Filled 2018-12-29 (×10): qty 1

## 2018-12-29 MED ORDER — HEPARIN BOLUS VIA INFUSION
4000.0000 [IU] | Freq: Once | INTRAVENOUS | Status: AC
  Administered 2018-12-29: 4000 [IU] via INTRAVENOUS
  Filled 2018-12-29: qty 4000

## 2018-12-29 MED ORDER — MORPHINE SULFATE (PF) 2 MG/ML IV SOLN
2.0000 mg | INTRAVENOUS | Status: DC | PRN
  Administered 2018-12-29 – 2018-12-30 (×3): 2 mg via INTRAVENOUS
  Filled 2018-12-29 (×4): qty 1

## 2018-12-29 MED ORDER — POTASSIUM CHLORIDE 10 MEQ/100ML IV SOLN
10.0000 meq | INTRAVENOUS | Status: AC
  Administered 2018-12-29 (×2): 10 meq via INTRAVENOUS
  Filled 2018-12-29 (×2): qty 100

## 2018-12-29 MED ORDER — HEPARIN (PORCINE) 25000 UT/250ML-% IV SOLN
1100.0000 [IU]/h | INTRAVENOUS | Status: DC
  Administered 2018-12-29: 1100 [IU]/h via INTRAVENOUS
  Filled 2018-12-29: qty 250

## 2018-12-29 MED ORDER — LORAZEPAM 2 MG/ML IJ SOLN
0.5000 mg | Freq: Once | INTRAMUSCULAR | Status: AC
  Administered 2018-12-29: 0.5 mg via INTRAVENOUS
  Filled 2018-12-29: qty 1

## 2018-12-29 NOTE — Progress Notes (Signed)
Progress Note  Patient Name: Summer Bradley Date of Encounter: 12/29/2018  Primary Cardiologist: New to [facility] - consult by End  Subjective   Somnolent today. Remains in Afib with controlled ventricular response. Transitioning from Eliquis to heparin gtt secondary to lethargy.   Inpatient Medications    Scheduled Meds: . bisoprolol  5 mg Oral BID  . budesonide (PULMICORT) nebulizer solution  0.5 mg Nebulization BID  . busPIRone  15 mg Oral BID  . calcium-vitamin D  1 tablet Oral BID  . Chlorhexidine Gluconate Cloth  6 each Topical Daily  . citalopram  20 mg Oral QHS  . diltiazem  300 mg Oral Daily  . famotidine  20 mg Oral BID  . guaiFENesin  600 mg Oral BID  . heparin  4,000 Units Intravenous Once  . insulin aspart  0-20 Units Subcutaneous Q4H  . ipratropium-albuterol  3 mL Nebulization Q6H  . levothyroxine  150 mcg Oral QAC breakfast  . LORazepam  0.5 mg Intravenous TID  . mouth rinse  15 mL Mouth Rinse BID  . methylPREDNISolone (SOLU-MEDROL) injection  40 mg Intravenous Daily  . nystatin  5 mL Oral QID  . pravastatin  20 mg Oral q1800  . QUEtiapine  25 mg Oral QHS  . sodium chloride flush  3 mL Intravenous Q12H   Continuous Infusions: . sodium chloride Stopped (12/27/18 2247)  . dexmedetomidine (PRECEDEX) IV infusion Stopped (12/29/18 1106)  . doxycycline (VIBRAMYCIN) IV 250 mL/hr at 12/29/18 1019  . heparin     PRN Meds: sodium chloride, acetaminophen **OR** acetaminophen, guaiFENesin-codeine, hydrALAZINE, levalbuterol, magnesium hydroxide, metoprolol tartrate, morphine injection, polyvinyl alcohol, sodium chloride flush, traZODone   Vital Signs    Vitals:   12/29/18 1100 12/29/18 1200 12/29/18 1300 12/29/18 1400  BP: (!) 154/99 (!) 150/103 124/85   Pulse:  71 85 84  Resp: (!) 38 (!) 21 (!) 25 (!) 23  Temp:  (!) 96.4 F (35.8 C)  97.7 F (36.5 C)  TempSrc:  Rectal    SpO2:  97% 98% 98%  Weight:      Height:        Intake/Output Summary (Last 24  hours) at 12/29/2018 1454 Last data filed at 12/29/2018 1400 Gross per 24 hour  Intake 1017.69 ml  Output 1875 ml  Net -857.31 ml   Filed Weights   12/27/18 0606 12/28/18 0302 12/28/18 1819  Weight: 96.3 kg 94.8 kg 95.4 kg    Telemetry    Afib, 70s to 90s bpm - Personally Reviewed  ECG    No new tracings - Personally Reviewed  Physical Exam   GEN: No acute distress. Lethargic.   Neck: No JVD. Cardiac: Irregularly irregular, no murmurs, rubs, or gallops.  Respiratory: Diminished breath sounds bilateral bases.  GI: Soft, nontender, non-distended.   MS: Trace bilateral ankle edema; No deformity. Neuro:  Somnolent.  Psych: Somnolent, opens eyes when asked.  Labs    Chemistry Recent Labs  Lab 12/23/18 1645  12/27/18 0506 12/28/18 0607 12/29/18 0520  NA 142   < > 143 144 145  K 3.7   < > 4.6 4.2 3.4*  CL 97*   < > 106 106 104  CO2 32   < > 25 26 30   GLUCOSE 97   < > 206* 189* 221*  BUN 13   < > 37* 42* 44*  CREATININE 1.01*   < > 0.99 1.01* 1.07*  CALCIUM 9.5   < > 9.6 9.9 9.2  PROT  7.4  --   --   --   --   ALBUMIN 3.9  --   --   --   --   AST 27  --   --   --   --   ALT 12  --   --   --   --   ALKPHOS 48  --   --   --   --   BILITOT 1.5*  --   --   --   --   GFRNONAA 55*   < > 56* 55* 51*  GFRAA >60   < > >60 >60 59*  ANIONGAP 13   < > 12 12 11    < > = values in this interval not displayed.     Hematology Recent Labs  Lab 12/27/18 0506 12/28/18 0607 12/29/18 0520  WBC 13.6* 19.2* 13.6*  RBC 4.89 4.90 4.81  HGB 15.0 15.2* 14.7  HCT 47.7* 45.6 45.2  MCV 97.5 93.1 94.0  MCH 30.7 31.0 30.6  MCHC 31.4 33.3 32.5  RDW 14.8 14.7 14.6  PLT 289 311 253    Cardiac EnzymesNo results for input(s): TROPONINI in the last 168 hours. No results for input(s): TROPIPOC in the last 168 hours.   BNP Recent Labs  Lab 12/23/18 1340 12/29/18 0521  BNP 269.0* 518.0*     DDimer No results for input(s): DDIMER in the last 168 hours.   Radiology    Dg Chest  Port 1 View  Result Date: 12/28/2018 IMPRESSION: Stable mild cardiomegaly without overt pulmonary edema. No acute pulmonary disease. Emphysema. Electronically Signed   By: Ilona Sorrel M.D.   On: 12/28/2018 17:32    Cardiac Studies   2D echo 12/25/2018: 1. Left ventricular ejection fraction, by visual estimation, is 55 to 60%. The left ventricle has normal function. There is no left ventricular hypertrophy.  2. Global right ventricle has normal systolic function.The right ventricular size is mildly enlarged. No increase in right ventricular wall thickness.  3. Left atrial size was mildly dilated.  4. Tricuspid valve regurgitation mild-moderate.  5. Moderately elevated pulmonary artery systolic pressure.  6. The tricuspid regurgitant velocity is 2.88 m/s, and with an assumed right atrial pressure of 10 mmHg, the estimated right ventricular systolic pressure is moderately elevated at 43.1 mmHg.  7. Rhythm is atrial fibrillation  8. Challenging image quality  Patient Profile     73 y.o. female with history of PAF, chronic hypoxic respiratory failure on supplemental oxygen at home, COPD, and HTN who we are seeing for Afib.   Assessment & Plan    1. PAF with RVR: -Ventricular rates have been well controlled over the past 24 hours -Transferring from Eliquis to heparin gtt today secondary to delirium  -Has been maintained on bisoprolol and Cardizem CD with good ventricular rates. However, with her somnolence and delirium, if she is not a candidate for pills, we will need to place her back on a diltiazem gtt for rate control if needed -Likely in the setting of #2 -Once her acute illness is improved, if she remains in Afib, TEE-guided DCCV can be considered  2. Acute on chronic hypoxic respiratory failure: -Secondary to AECOPD -Per IM  3. Delirium with underlying dementia: -Transferred to the ICU on 11/11 -Transferred to heparin gtt today in the setting of increased somnolence     For  questions or updates, please contact [place] Please consult [email or web address] for contact info under Cardiology/STEMI.    Signed, Christell Faith, PA-C  [place] Pager: [phone] 12/29/2018, 2:54 PM

## 2018-12-29 NOTE — Consult Note (Addendum)
Consultation Note Date: 12/29/2018   Patient Name: Summer Bradley  DOB: [birth year]  MRN: [id number]  Age / Sex: 73 y.o., female  PCP: [place] Referring Physician: Loletha Grayer, MD  Reason for Consultation: Establishing goals of care  HPI/Patient Profile: 73 y.o. female  with past medical history of COPD on home oxygen 3L, stroke, hypothyroidism, hypertension, GERD, depression, anxiety, atrial fibrillation, past trach/peg placement now removed admitted on 12/23/2018 with shortness of breath. Hospital admission for acute on chronic hypoxic and hypercarbic respiratory failure secondary to acute exacerbation of COPD. Required transfer to ICU on 11/11 following a rapid response call for worsening respiratory status. Patient intermittently requiring BiPAP and remains encephalopathic. Chest xray negative for acute finding. Palliative medicine consultation for goals of care.   Clinical Assessment and Goals of Care:  I have reviewed medical records, discussed with care team, and assessed the patient at bedside. She is lethargic and on BiPAP this afternoon. Unable to participate in [place] discussion.   Met with daughter Janace Hoard) and her sister on Facetime Jackelyn Poling) to discuss diagnosis, prognosis, GOC, EOL wishes, disposition and options. [name] shares that her and Jackelyn Poling are POA's. They have two brothers also that are local and involved.  Introduced Palliative Medicine as specialized medical care for people living with serious illness. It focuses on providing relief from the symptoms and stress of a serious illness. The goal is to improve quality of life for both the patient and the family.  We discussed a brief life review of the patient. Widowed. Four children and multiple great and great-grandchildren. Daughters shares an extensive review of her medical history including a prolonged  hospitalization requiring trach/peg and LTACH care (surprisingly recovered and was able to return home), also multiple strokes in 2015, and progressive COPD requiring home oxygen.   Prior to this admission, living home with a son. Baseline, typically ambulates with walker, on 3L [place], and with excellent nutritional status. Family reports she is forgetful but does not have baseline dementia. Patient was experiencing worsening shortness of breath for weeks leading up to admission. She is followed by [facility] and receives home health services 3X week.   Discussed events leading up to admission and course of hospitalization including diagnoses, interventions, and plan of care. Discussed end-stage of her COPD leading to chronic respiratory failure. Explained concern that clinically she is worsening despite 6 days of aggressive management for COPD exacerbation.   I attempted to elicit values and goals of care important to the patient/daughters. Debbie and [name] share that she has previously spoken that she would want "everything to live if she could have quality of life." Both daughters agree that quality of life is more important than quantity of life/prolonging her life if she was not showing improvement.   Jackelyn Poling and [name] confirm decision for DNR/DNI code status after discussion with critical care team last night. They share that their mother has "suffered enough" and they would not wish to put her back through trach/peg placement. Frankly and compassionately agreed with this  humble decision they have made for their mother.   The difference between aggressive medical intervention and comfort care was considered in light of the patient's goals of care. Jackelyn Poling asks what comfort care would look like and if family could be present. Explained transition to comfort measures including focus on symptom management to ensure relief from suffering at EOL. Explained visitation policy.   [name] is tearful and not ready to  make this decision today. We discussed watchful waiting for 24-48 hours to see if she makes any improvement in the next few days. Again confirmed DNR/DNI, no escalation of care but continuing current plan of care.   Answered all questions and concerns to the best of my ability. Emotional support provided.     SUMMARY OF RECOMMENDATIONS    Daughters report they are documented POA's--Angela and Debbie.  Daughters confirm DNR/DNI code status. Quality of life is more important than quantity.  Continue current plan of care and medical management. Daughters request watchful waiting for 24-48 more hours.  Daughters hopeful for improvement but also do seem to be grasping the severity of their mother's condition and would not wish to prolong her suffering if not showing improvement. Discussed what transition to comfort would look like. The patient has 4 children that would wish to be present if withdrawal of care off BiPAP is necessary.  PMT provider unfortunately not at [facility] through the weekend. Further discussions may be necessary by attending or pccm pending patient's clinical status.   Code Status/Advance Care Planning:  DNR  Symptom Management:   Per attending  Palliative Prophylaxis:   Aspiration, Delirium Protocol, Frequent Pain Assessment, Oral Care and Turn Reposition  Psycho-social/Spiritual:   Desire for further Chaplaincy support: yes  Additional Recommendations: Caregiving  Support/Resources, Compassionate Wean Education and Education on Hospice  Prognosis:   Poor prognosis  Discharge Planning: To Be Determined      Primary Diagnoses: Present on Admission: . COPD exacerbation ([place])   I have reviewed the medical record, interviewed the patient and family, and examined the patient. The following aspects are pertinent.  Past Medical History:  Diagnosis Date  . A-fib ([place])   . Anxiety   . COPD (chronic obstructive pulmonary disease) ([place])   . Cough    CHONIC   . Depression   . Dysrhythmia   . GERD (gastroesophageal reflux disease)   . Heart disease   . High cholesterol   . Hypertension   . Hypothyroidism   . Orthopnea   . Oxygen dependent    2/L CONTINUOUSLY  . Stroke ([place])   . Vertigo   . Wheezing    Social History   Socioeconomic History  . Marital status: Single    Spouse name: Not on file  . Number of children: 4  . Years of education: 53 TH  . Highest education level: Not on file  Occupational History  . Not on file  Social Needs  . Financial resource strain: Not on file  . Food insecurity    Worry: Not on file    Inability: Not on file  . Transportation needs    Medical: Not on file    Non-medical: Not on file  Tobacco Use  . Smoking status: Former Research scientist (life sciences)  . Smokeless tobacco: Never Used  . Tobacco comment: QUIT SMOKING "13 YEARS AGO"  Substance and Sexual Activity  . Alcohol use: No    Alcohol/week: 0.0 standard drinks  . Drug use: No  . Sexual activity: Not on file  Lifestyle  .  Physical activity    Days per week: Not on file    Minutes per session: Not on file  . Stress: Not on file  Relationships  . Social Herbalist on phone: Not on file    Gets together: Not on file    Attends religious service: Not on file    Active member of club or organization: Not on file    Attends meetings of clubs or organizations: Not on file    Relationship status: Not on file  Other Topics Concern  . Not on file  Social History Narrative   Patient is widowed with 4 children.   Patient is right handed.   Patient has 11 th grade education.   Patient drinks 2 sodas daily.   Family History  Problem Relation Age of Onset  . Diabetes Mother    Scheduled Meds: . bisoprolol  5 mg Oral BID  . budesonide (PULMICORT) nebulizer solution  0.5 mg Nebulization BID  . calcium-vitamin D  1 tablet Oral BID  . Chlorhexidine Gluconate Cloth  6 each Topical Daily  . citalopram  20 mg Oral QHS  . diltiazem  300 mg Oral  Daily  . famotidine  20 mg Oral BID  . guaiFENesin  600 mg Oral BID  . insulin aspart  0-20 Units Subcutaneous Q4H  . ipratropium-albuterol  3 mL Nebulization Q6H  . levothyroxine  150 mcg Oral QAC breakfast  . LORazepam  0.5 mg Intravenous TID  . mouth rinse  15 mL Mouth Rinse BID  . nystatin  5 mL Oral QID  . pravastatin  20 mg Oral q1800  . QUEtiapine  25 mg Oral QHS  . sodium chloride flush  3 mL Intravenous Q12H   Continuous Infusions: . sodium chloride Stopped (12/27/18 2247)  . dexmedetomidine (PRECEDEX) IV infusion Stopped (12/29/18 1106)  . doxycycline (VIBRAMYCIN) IV 250 mL/hr at 12/29/18 1019  . heparin 1,100 Units/hr (12/29/18 1520)   PRN Meds:.sodium chloride, acetaminophen **OR** acetaminophen, guaiFENesin-codeine, hydrALAZINE, levalbuterol, magnesium hydroxide, metoprolol tartrate, morphine injection, polyvinyl alcohol, sodium chloride flush, traZODone Medications Prior to Admission:  Prior to Admission medications   Medication Sig Start Date End Date Taking? Authorizing Provider  albuterol (PROVENTIL HFA;VENTOLIN HFA) 108 (90 BASE) MCG/ACT inhaler Inhale 2 puffs into the lungs every 4 (four) hours as needed for wheezing or shortness of breath.    Yes [provider]  albuterol (PROVENTIL) (2.5 MG/3ML) 0.083% nebulizer solution Take 2.5 mg by nebulization every 6 (six) hours as needed for wheezing or shortness of breath.   Yes [provider]  apixaban (ELIQUIS) 5 MG TABS tablet Take 1 tablet (5 mg total) by mouth 2 (two) times daily. 03/07/14  Yes Rosalin Hawking, MD  bisoprolol (ZEBETA) 5 MG tablet Take 0.5 tablets (2.5 mg total) by mouth 2 (two) times daily. 03/05/14  Yes Rosalin Hawking, MD  budesonide-formoterol [facility]) 160-4.5 MCG/ACT inhaler Inhale 2 puffs into the lungs 2 (two) times daily.   Yes [provider]  busPIRone (BUSPAR) 10 MG tablet Take 10 mg by mouth 2 (two) times daily.   Yes [provider]  Calcium Carbonate-Vitamin D  600-400 MG-UNIT per tablet Take 1 tablet by mouth 2 (two) times daily.    Yes [provider]  citalopram (CELEXA) 40 MG tablet Take 40 mg by mouth at bedtime.  07/04/14  Yes [provider]  diltiazem (CARDIZEM CD) 240 MG 24 hr capsule Take 240 mg by mouth daily.   Yes [provider]  docusate sodium (COLACE) 100 MG capsule Take 100 mg by mouth daily.   Yes [provider]  furosemide (LASIX) 20 MG tablet Take 1 tablet (20 mg total) by mouth 2 (two) times daily. Patient taking differently: Take 40 mg by mouth every morning.  03/05/14  Yes Rosalin Hawking, MD  levothyroxine (SYNTHROID, LEVOTHROID) 150 MCG tablet Take 150 mcg by mouth daily before breakfast.  01/09/14  Yes [provider]  LORazepam (ATIVAN) 0.5 MG tablet Take 0.5 mg by mouth 3 (three) times daily. Take 1 tablet once daily, take 2 tablets at night   Yes [provider]  lovastatin (MEVACOR) 10 MG tablet Take 10 mg by mouth daily.   Yes [provider]  metolazone (ZAROXOLYN) 2.5 MG tablet Take 2.5 mg by mouth once a week. wednesday   Yes [provider]  Polyethyl Glycol-Propyl Glycol (SYSTANE ULTRA OP) Place 1 drop into both eyes every 4 (four) hours as needed (dry eyes).   Yes [provider]  potassium chloride SA (K-DUR,KLOR-CON) 20 MEQ tablet Take 2 tablets (40 mEq total) by mouth 2 (two) times daily. 03/05/14  Yes Rosalin Hawking, MD  sodium chloride (AYR) 0.65 % nasal spray Place 2 sprays into the nose every 2 (two) hours as needed for congestion.   Yes [provider]  Tiotropium Bromide Monohydrate (SPIRIVA RESPIMAT) 2.5 MCG/ACT AERS Inhale 1 puff into the lungs daily.   Yes [provider]   Allergies  Allergen Reactions  . Lisinopril Cough   Review of Systems  Unable to perform ROS: Acuity of condition   Physical Exam Vitals signs and nursing note reviewed.  Constitutional:      Appearance: She is ill-appearing.  HENT:      Head: Normocephalic and atraumatic.  Cardiovascular:     Rate and Rhythm: Rhythm irregularly irregular.     Comments: afib  Pulmonary:     Effort: No tachypnea, accessory muscle usage or respiratory distress.     Breath sounds: Decreased breath sounds present.     Comments: BiPAP Abdominal:     Tenderness: There is no abdominal tenderness.  Skin:    General: Skin is warm and dry.  Neurological:     Mental Status: She is lethargic.    Vital Signs: BP 124/85   Pulse 84   Temp 97.7 F (36.5 C)   Resp (!) 23   Ht _0  (1.6 m)   Wt 95.4 kg   SpO2 98%   BMI 37.26 kg/m  Pain Scale: CPOT   Pain Score: 0-No pain   SpO2: SpO2: 98 % O2 Device:SpO2: 98 % O2 Flow Rate: .O2 Flow Rate (L/min): 4 L/min  IO: Intake/output summary:   Intake/Output Summary (Last 24 hours) at 12/29/2018 1645 Last data filed at 12/29/2018 1400 Gross per 24 hour  Intake 1017.69 ml  Output 1875 ml  Net -857.31 ml    LBM: Last BM Date: 12/27/18 Baseline Weight: Weight: 90.7 kg Most recent weight: Weight: 95.4 kg     Palliative Assessment/Data: PPS 30%   Flowsheet Rows     Most Recent Value  Intake Tab  Referral Department  Critical care  Unit at Time of Referral  ICU  Palliative Care Primary Diagnosis  Pulmonary  Palliative Care Type  New Palliative care  Reason for referral  Clarify Goals of Care  Date first seen by Palliative Care  12/29/18  Clinical Assessment  Palliative Performance Scale Score  30%  Psychosocial & Spiritual Assessment  Palliative Care Outcomes  Patient/Family meeting held?  Yes  Who was at the meeting?  daughters Jackelyn Poling and [place])  Palliative Care Outcomes  Clarified goals of care, Counseled regarding hospice, Provided end of life care assistance, Provided psychosocial or spiritual support, ACP counseling assistance      Time In: 1400 Time Out: 1515 Time Total: 43mn Greater than 50%  of this time was spent counseling and coordinating care related to the above  assessment and plan.  Signed by:  MIhor Dow DNP, FNP-C Palliative Medicine Team  Phone: [phone]: [phone]  Please contact Palliative Medicine Team phone at [phone] questions and concerns.  For individual provider: See AShea Evans

## 2018-12-29 NOTE — Progress Notes (Signed)
PT Cancellation Note  Patient Details Name: Summer Bradley MRN: [id number] DOB: May 22, 1945   Cancelled Treatment:    Reason Eval/Treat Not Completed: Medical issues which prohibited therapy(Per chart review, patient now transferred to CCU due to change in medical status.  Per guidelines, will require new orders to resume PT services.  Please re-consult as medically appropriate.)   Lorilee Cafarella H. Owens Shark, PT, DPT, NCS 12/29/18, 8:36 AM [phone]

## 2018-12-29 NOTE — Progress Notes (Addendum)
Ch visited pt after learning pt will be placed on C-C measures. Pt had a dau at bedside that was here once she became aware of the change in pt's status to critical care. Pt is a 73 y.o. female that has a 10 yr. hx of COPD, CHF, 2 strokes, and is O2 dependent. Pt has the support of her children at home but mainly from the dau that is at bedside. Dau c/o the pt's struggle w/ being anxious and how it has impacted her COPD. The pt has used Ativan, but it make her sleepy and bed-bound. The pt has been limited on doing the things she used to enjoy such as cooking, going out and socializing, driving, and being around her grandchildren. Pt's quality of life/socailizing has declined significantly as reported by the dau. Ch provided ministerial presence, listened to the dau give a life review of the pt, and provided words of comfort. F/U recommended.    12/29/18 1100  Clinical Encounter Type  Visited With Patient and family together  Visit Type Spiritual support;Social support;Critical Care  Referral From Nurse  Consult/Referral To Chaplain  Spiritual Encounters  Spiritual Needs Emotional;Grief support  Stress Factors  Patient Stress Factors Exhausted;Health changes;Loss of control;Major life changes  Family Stress Factors Major life changes;Exhausted

## 2018-12-29 NOTE — Progress Notes (Addendum)
Follow up - Critical Care Medicine Note  Patient Details:    Summer Bradley is an 73 y.o. female  admitted with acute on chronic hypoxic and hypercarbic respiratory failure secondary to AECOPD requiring BiPAP.  She has very severe COPD (stage IV) and has deteriorated due to delirium.  Lines, Airways, Drains: Urethral Catheter Mariah Campbell Latex 16 Fr. (Active)  Indication for Insertion or Continuance of Catheter Acute urinary retention (I&O Cath for 24 hrs prior to catheter insertion- Inpatient Only) 12/29/18 1400  Site Assessment Clean;Intact 12/29/18 1400  Date Prophylactic Dressing Applied (if applicable) [id number]  Catheter Maintenance Bag below level of bladder;Catheter secured;Drainage bag/tubing not touching floor;Insertion date on drainage bag;No dependent loops;Seal intact 12/29/18 1400  Collection Container Standard drainage bag 12/29/18 1400  Securement Method Securing device (Describe) 12/29/18 1400  Output (mL) 125 mL 12/29/18 1200    Anti-infectives:  Anti-infectives (From admission, onward)   Start     Dose/Rate Route Frequency Ordered Stop   12/28/18 2045  doxycycline (VIBRAMYCIN) 100 mg in sodium chloride 0.9 % 250 mL IVPB     100 mg 125 mL/hr over 120 Minutes Intravenous Every 12 hours 12/28/18 2038     12/26/18 2200  doxycycline (VIBRA-TABS) tablet 100 mg  Status:  Discontinued     100 mg Oral Every 12 hours 12/26/18 1655 12/28/18 2038   12/25/18 2200  vancomycin (VANCOCIN) IVPB 1000 mg/200 mL premix  Status:  Discontinued     1,000 mg 200 mL/hr over 60 Minutes Intravenous Every 24 hours 12/24/18 2125 12/25/18 1107   12/24/18 2130  vancomycin (VANCOCIN) 2,000 mg in sodium chloride 0.9 % 500 mL IVPB     2,000 mg 250 mL/hr over 120 Minutes Intravenous  Once 12/24/18 2118 12/25/18 0014   12/24/18 1000  azithromycin (ZITHROMAX) tablet 500 mg  Status:  Discontinued     500 mg Oral Daily 12/23/18 2233 12/24/18 0056   12/24/18 1000  doxycycline  (VIBRA-TABS) tablet 100 mg  Status:  Discontinued     100 mg Oral Every 12 hours 12/24/18 0159 12/24/18 2118   12/24/18 0100  doxycycline (VIBRA-TABS) tablet 100 mg  Status:  Discontinued     100 mg Oral Every 12 hours 12/24/18 0056 12/24/18 0159   12/23/18 2245  cefTRIAXone (ROCEPHIN) 1 g in sodium chloride 0.9 % 100 mL IVPB     1 g 200 mL/hr over 30 Minutes Intravenous Every 24 hours 12/23/18 2233 12/27/18 2049      Microbiology: Results for orders placed or performed during the hospital encounter of 12/23/18  SARS CORONAVIRUS 2 (TAT 6-24 HRS) Nasopharyngeal Nasopharyngeal Swab     Status: None   Collection Time: 12/23/18 10:30 PM   Specimen: Nasopharyngeal Swab  Result Value Ref Range Status   SARS Coronavirus 2 NEGATIVE NEGATIVE Final    Comment: (NOTE) SARS-CoV-2 target nucleic acids are NOT DETECTED. The SARS-CoV-2 RNA is generally detectable in upper and lower respiratory specimens during the acute phase of infection. Negative results do not preclude SARS-CoV-2 infection, do not rule out co-infections with other pathogens, and should not be used as the sole basis for treatment or other patient management decisions. Negative results must be combined with clinical observations, patient history, and epidemiological information. The expected result is Negative. Fact Sheet for Patients: SugarRoll.be Fact Sheet for Healthcare Providers: https://www.woods-mathews.com/ This test is not yet approved or cleared by the [place] FDA and  has been authorized for detection and/or diagnosis of SARS-CoV-2 by FDA under an  Emergency Use Authorization (EUA). This EUA will remain  in effect (meaning this test can be used) for the duration of the COVID-19 declaration under Section 56 4(b)(1) of the Act, 21 U.S.C. section 360bbb-3(b)(1), unless the authorization is terminated or revoked sooner. Performed at [facility], [place] [address]  [place], [place] [address]   Culture, blood (routine x 2) Call MD if unable to obtain prior to antibiotics being given     Status: Abnormal   Collection Time: 12/24/18  1:42 AM   Specimen: BLOOD  Result Value Ref Range Status   Specimen Description   Final    BLOOD RIGHT FOREARM Performed at [facility], [address], [place], [place] [address]    Special Requests   Final    BOTTLES DRAWN AEROBIC AND ANAEROBIC Blood Culture adequate volume Performed at [facility], [address] [place], [place] [address]    Culture  Setup Time   Final    AEROBIC BOTTLE ONLY GRAM POSITIVE COCCI CRITICAL RESULT CALLED TO, READ BACK BY AND VERIFIED WITH: ROBBINS,J AT 2046 ON 12/24/2018 BY MOSLEY,J GRAM POSITIVE RODS ANAEROBIC BOTTLE ONLY CRITICAL RESULT CALLED TO, READ BACK BY AND VERIFIED WITH: DAVID BESANTI AT [place] ON 12/25/18 RWW    Culture (A)  Final    STAPHYLOCOCCUS SPECIES (COAGULASE NEGATIVE) THE SIGNIFICANCE OF ISOLATING THIS ORGANISM FROM A SINGLE SET OF BLOOD CULTURES WHEN MULTIPLE SETS ARE DRAWN IS UNCERTAIN. PLEASE NOTIFY THE MICROBIOLOGY DEPARTMENT WITHIN ONE WEEK IF SPECIATION AND SENSITIVITIES ARE REQUIRED. DIPHTHEROIDS(CORYNEBACTERIUM SPECIES) Standardized susceptibility testing for this organism is not available. Performed at [facility], [place] [address] [place], [place] [address]    Report Status 12/26/2018 FINAL  Final  Blood Culture ID Panel (Reflexed)     Status: Abnormal   Collection Time: 12/24/18  1:42 AM  Result Value Ref Range Status   Enterococcus species NOT DETECTED NOT DETECTED Final   Listeria monocytogenes NOT DETECTED NOT DETECTED Final   Staphylococcus species DETECTED (A) NOT DETECTED Final    Comment: Methicillin (oxacillin) resistant coagulase negative staphylococcus. Possible blood culture contaminant (unless isolated from more than one blood culture draw or clinical case suggests pathogenicity). No antibiotic treatment is indicated  for blood  culture contaminants. CRITICAL RESULT CALLED TO, READ BACK BY AND VERIFIED WITH: ROBBINS,J AT 2046 ON 12/24/2018 BY MOSLEY,J    Staphylococcus aureus (BCID) NOT DETECTED NOT DETECTED Final   Methicillin resistance DETECTED (A) NOT DETECTED Final    Comment: CRITICAL RESULT CALLED TO, READ BACK BY AND VERIFIED WITH: ROBBINS,J AT 2046 ON 12/24/2018 BY MOSLEY,J    Streptococcus species NOT DETECTED NOT DETECTED Final   Streptococcus agalactiae NOT DETECTED NOT DETECTED Final   Streptococcus pneumoniae NOT DETECTED NOT DETECTED Final   Streptococcus pyogenes NOT DETECTED NOT DETECTED Final   Acinetobacter baumannii NOT DETECTED NOT DETECTED Final   Enterobacteriaceae species NOT DETECTED NOT DETECTED Final   Enterobacter cloacae complex NOT DETECTED NOT DETECTED Final   Escherichia coli NOT DETECTED NOT DETECTED Final   Klebsiella oxytoca NOT DETECTED NOT DETECTED Final   Klebsiella pneumoniae NOT DETECTED NOT DETECTED Final   Proteus species NOT DETECTED NOT DETECTED Final   Serratia marcescens NOT DETECTED NOT DETECTED Final   Haemophilus influenzae NOT DETECTED NOT DETECTED Final   Neisseria meningitidis NOT DETECTED NOT DETECTED Final   Pseudomonas aeruginosa NOT DETECTED NOT DETECTED Final   Candida albicans NOT DETECTED NOT DETECTED Final   Candida glabrata NOT DETECTED NOT DETECTED  Final   Candida krusei NOT DETECTED NOT DETECTED Final   Candida parapsilosis NOT DETECTED NOT DETECTED Final   Candida tropicalis NOT DETECTED NOT DETECTED Final    Comment: Performed at [facility], [address] [place], [place] [address]  Culture, blood (routine x 2)     Status: None (Preliminary result)   Collection Time: 12/25/18  9:25 AM   Specimen: BLOOD  Result Value Ref Range Status   Specimen Description BLOOD RIGHT ANTECUBITAL  Final   Special Requests   Final    BOTTLES DRAWN AEROBIC AND ANAEROBIC Blood Culture results may not be optimal due to an inadequate volume  of blood received in culture bottles   Culture   Final    NO GROWTH 4 DAYS Performed at [facility], [address] [place], [place] [address]    Report Status PENDING  Incomplete  Culture, blood (routine x 2)     Status: None (Preliminary result)   Collection Time: 12/25/18  9:34 AM   Specimen: BLOOD  Result Value Ref Range Status   Specimen Description BLOOD BLOOD RIGHT FOREARM  Final   Special Requests   Final    BOTTLES DRAWN AEROBIC AND ANAEROBIC Blood Culture adequate volume   Culture   Final    NO GROWTH 4 DAYS Performed at [facility], [address], [place], [place] [address]    Report Status PENDING  Incomplete  MRSA PCR Screening     Status: None   Collection Time: 12/28/18  6:26 PM   Specimen: Nasal Mucosa; Nasopharyngeal  Result Value Ref Range Status   MRSA by PCR NEGATIVE NEGATIVE Final    Comment:        The GeneXpert MRSA Assay (FDA approved for NASAL specimens only), is one component of a comprehensive MRSA colonization surveillance program. It is not intended to diagnose MRSA infection nor to guide or monitor treatment for MRSA infections. Performed at [facility], [address] [place], [place] [address]     Best Practice/Protocols:  VTE Prophylaxis: Heparin (drip) GI Prophylaxis: Antihistamine   Events: 11/6: Pt admitted to the telemetry unit  11/6: CTA Chest revealed evaluation for pulmonary emboli is limited by motion artifact. Given this limitation, no PE was identified. Cardiomegaly with coronary artery disease. Moderate-sized hiatal hernia. Cholelithiasis without CT evidence for acute cholecystitis. Emphysema and aortic atherosclerosis.Aortic Atherosclerosis (ICD10-I70.0) and Emphysema (ICD10-J43.9). 11/7: CT Head/Cervical Spine revealed small left frontal scalp contusion. No calvarial fracture. No acute intracranial abnormality. No acute fracture or dislocation of cervical spine. Large chronic right MCA  distribution infarction as well as small infarctions of right cerebellar hemisphere and left thalamus. Advanced cervical spondylosis with prominent left-sided facet arthropathy. 11/8: Echo revealed left ventricular ejection fraction, by visual estimation, is 55 to 60%. The left ventricle has normal function. There is no left ventricular hypertrophy. Global right ventricle has normal systolic function.The right ventricular size is mildly enlarged. No increase in right ventricular wall thickness. 11/11: Rapid response initiated due to pt developing worsening acute on chronic respiratory failure and encephalopathy requiring transfer to the stepdown unit for BiPAP 11/12: Lethargic today.  Required Precedex due to delirium, this is now off.    CULTURES: Blood x 2 11/8>>negative  Blood 11/8>>1/4 GPC staph species Mec A +, now another 1/4 (2/4 total) GPR  MRSA 11/11>>negative    Studies: Dg Chest 2 View  Result Date: 12/23/2018 CLINICAL DATA:  Shortness of breath EXAM: CHEST - 2 VIEW COMPARISON:  February 06, 2018 and December 23, 2015 FINDINGS: There is atelectatic change in the right base region. There is mild stable interstitial thickening. There is no frank edema or consolidation. The heart is upper normal in size with pulmonary vascularity normal. No adenopathy. There is degenerative change in the thoracic spine. IMPRESSION: Stable interstitial thickening, likely representing a degree of chronic inflammatory change/underlying fibrosis. Mild atelectasis right base. No edema or consolidation. Stable cardiac silhouette. No evident adenopathy. Electronically Signed   By: Lowella Grip III M.D.   On: 12/23/2018 14:42   Ct Angio Chest Pe W And/or Wo Contrast  Result Date: 12/23/2018 CLINICAL DATA:  Shortness of breath with concern for PE. EXAM: CT ANGIOGRAPHY CHEST WITH CONTRAST TECHNIQUE: Multidetector CT imaging of the chest was performed using the standard protocol during bolus administration of  intravenous contrast. Multiplanar CT image reconstructions and MIPs were obtained to evaluate the vascular anatomy. CONTRAST:  68mL OMNIPAQUE IOHEXOL 350 MG/ML SOLN COMPARISON:  None. FINDINGS: Cardiovascular: Evaluation for pulmonary emboli is limited by motion artifact. Given this limitation, no PE was identified. The main pulmonary artery is dilated measuring approximately 3.4 cm in diameter. There is no CT evidence of acute right heart strain. There are atherosclerotic changes of the thoracic aorta. The descending aorta is ectatic measuring up to approximately 3.6 cm in diameter. No evidence for an aneurysm. Heart size is enlarged. Coronary artery calcifications are noted. Mediastinum/Nodes: --No mediastinal or hilar lymphadenopathy. --No axillary lymphadenopathy. --No supraclavicular lymphadenopathy. --Normal thyroid gland. --The esophagus is unremarkable Lungs/Pleura: Emphysematous changes are noted bilaterally. There is no pneumothorax. No large pleural effusion. No focal infiltrate. Upper Abdomen: There is cholelithiasis without CT evidence for acute cholecystitis. There is a moderate-sized hiatal hernia. Musculoskeletal: No chest wall abnormality. No acute or significant osseous findings. Review of the MIP images confirms the above findings. IMPRESSION: 1. Evaluation for pulmonary emboli is limited by motion artifact. Given this limitation, no PE was identified. 2. Cardiomegaly with coronary artery disease. 3. Moderate-sized hiatal hernia. 4. Cholelithiasis without CT evidence for acute cholecystitis. 5. Emphysema and aortic atherosclerosis. Aortic Atherosclerosis (ICD10-I70.0) and Emphysema (ICD10-J43.9). Electronically Signed   By: Constance Holster M.D.   On: 12/23/2018 19:59   Dg Chest Port 1 View  Result Date: 12/28/2018 CLINICAL DATA:  Dyspnea, COPD EXAM: PORTABLE CHEST 1 VIEW COMPARISON:  12/24/2018 chest radiograph. FINDINGS: Stable cardiomediastinal silhouette with mild cardiomegaly. No  pneumothorax. No pleural effusion. Emphysema. No overt pulmonary edema. No acute consolidative airspace disease. IMPRESSION: Stable mild cardiomegaly without overt pulmonary edema. No acute pulmonary disease. Emphysema. Electronically Signed   By: Ilona Sorrel M.D.   On: 12/28/2018 17:32   Dg Chest Port 1 View  Result Date: 12/24/2018 CLINICAL DATA:  Cough, SOB, weakness. Hx of wheezing, HTN, COPD, a-fib. Former smoker. EXAM: PORTABLE CHEST 1 VIEW COMPARISON:  Chest radiograph 02/06/2018 FINDINGS: Stable cardiomediastinal contours. Mildly enlarged heart size. Bilateral diffuse coarse interstitial opacities with basilar predominance likely representing chronic lung disease. No new focal opacity. No pneumothorax or large pleural effusion. No acute finding in the visualized skeleton. IMPRESSION: Stable chest x-ray with chronic coarse interstitial opacities, basilar predominance. No acute cardiopulmonary findings. Electronically Signed   By: Audie Pinto M.D.   On: 12/24/2018 15:04    Consults: Treatment Team:  Nelva Bush, MD Tyler Pita, MD   Subjective:    Overnight Issues: Quiet Precedex overnight.  Was finally able to sleep at 2 AM.  She has not slept "in days".  Patient uses Ativan as an  outpatient.  Note that she is also on BuSpar.   Objective:  Vital signs for last 24 hours: Temp:  [94.2 F (34.6 C)-98.2 F (36.8 C)] 97.7 F (36.5 C) (11/12 1400) Pulse Rate:  [25-136] 84 (11/12 1400) Resp:  [16-38] 23 (11/12 1400) BP: (96-175)/(80-142) 124/85 (11/12 1300) SpO2:  [93 %-100 %] 98 % (11/12 1400) FiO2 (%):  [30 %-40 %] 30 % (11/12 1456) Weight:  [95.4 kg] 95.4 kg (11/11 1819)  Hemodynamic parameters for last 24 hours:    Intake/Output from previous day: 11/11 0701 - 11/12 0700 In: 884.4 [P.O.:360; I.V.:274.4; IV Piggyback:250] Out: 1000 [Urine:1000]  Intake/Output this shift: Total I/O In: 493.3 [I.V.:63.5; IV Piggyback:429.9] Out: 875 [Urine:875]  Vent  settings for last 24 hours: FiO2 (%):  [30 %-40 %] 30 %  Physical Exam:  General:  Chronically ill appearing female, currently on nasal cannula at 4 L/min, lethargic Neuro:  Lethargic, not following commands, PERRL  HEENT: No JVD present  Cardiovascular: irregular irregular, no R/G  Lungs: diminished throughout, no wheezes, poor air movement bilaterally Abdomen: +BS x4, obese, soft, non tender, non distended  Musculoskeletal: normal bulk and tone, trace generalized edema  Skin: diffuse scattered ecchymosis, bilateral upper extremity skin tears   Assessment/Plan:  Acute on chronic hypoxic and hypercarbic respiratory failure secondary AECOPD  Hx: Cough, Chronic Home O2 @2L , and Orthopnea  Cor pulmonale/pulmonary hypertension PRN AVAPS for dyspnea and/or hypoxia, patient was switched to AVAPS yesterday Scheduled and PRN  bronchodilator therapy  Continue doxycycline  Reassess need for Lasix again Nebulized steroids  PRN morphine for air hunger   Paroxysmal atrial fibrillation  Hx: HTN, Stroke, and HLD Continuous telemetry monitoring  PRN IV metoprolol for heart rate control  Continue outpatient cardiac medications once pt able to tolerate po  Cardiology following-appreciate input   Leukocytosis: 2/4 Blood Cultures positive for staph species Mec A possible contaminant  Trend WBC and monitor fever curve Trend PCT Follow cultures  Abx : Ceftriaxone 11/6>>11/10 Vancomycin 11/7>>11/8 Doxycycline 11/9>>  Hyperglycemia  CBG's q4hrs  SSI   Acute encephalopathy pt with underlying dementia  Frequent reorientation  Precedex now off Patient has been on benzodiazepines BuSpar is relatively ineffective once patients have been on benzodiazepines Likelihood of switching her from a benzodiazepine to BuSpar will be nil Patient is DNR/DNI and should concentrate on comfort:. low-dose benzodiazepines PRN anxiety Part of her encephalopathy and delirium may have been related to  benzodiazepine withdrawal    LOS: 6 days   Additional comments: Discussed during multidisciplinary rounds.  Prognosis overall is poor given the severity of the patient's underlying pulmonary disease.  Critical Care Total Time*:   C. Derrill Kay, MD [facility] 12/29/2018  *Care during the described time interval was provided by me and/or other providers on the critical care team.  I have reviewed this patient's available data, including medical history, events of note, physical examination and test results as part of my evaluation.  **This note was dictated using voice recognition software/Dragon.  Despite best efforts to proofread, errors can occur which can change the meaning.  Any change was purely unintentional.

## 2018-12-29 NOTE — Consult Note (Signed)
[place] for heparin infusion Indication: atrial fibrillation  Patient Measurements: Height: 5\' 3"  (160 cm) Weight: 210 lb 5.1 oz (95.4 kg) IBW/kg (Calculated) : 52.4 Heparin Dosing Weight: 74.5 kg  Vital Signs: Temp: 97.7 F (36.5 C) (11/12 1400) Temp Source: Rectal (11/12 1200) BP: 124/85 (11/12 1300) Pulse Rate: 84 (11/12 1400)  Labs: Recent Labs    12/27/18 0506 12/28/18 0607 12/29/18 0520  HGB 15.0 15.2* 14.7  HCT 47.7* 45.6 45.2  PLT 289 311 253  CREATININE 0.99 1.01* 1.07*    Estimated Creatinine Clearance: 50.7 mL/min (A) (by C-G formula based on SCr of 1.07 mg/dL (H)).   Medical History: Past Medical History:  Diagnosis Date  . A-fib ([place])   . Anxiety   . COPD (chronic obstructive pulmonary disease) ([place])   . Cough    CHONIC  . Depression   . Dysrhythmia   . GERD (gastroesophageal reflux disease)   . Heart disease   . High cholesterol   . Hypertension   . Hypothyroidism   . Orthopnea   . Oxygen dependent    2/L CONTINUOUSLY  . Stroke ([place])   . Vertigo   . Wheezing     Medications:  Scheduled:  . bisoprolol  5 mg Oral BID  . budesonide (PULMICORT) nebulizer solution  0.5 mg Nebulization BID  . busPIRone  15 mg Oral BID  . calcium-vitamin D  1 tablet Oral BID  . Chlorhexidine Gluconate Cloth  6 each Topical Daily  . citalopram  20 mg Oral QHS  . diltiazem  300 mg Oral Daily  . famotidine  20 mg Oral BID  . guaiFENesin  600 mg Oral BID  . insulin aspart  0-20 Units Subcutaneous Q4H  . ipratropium-albuterol  3 mL Nebulization Q6H  . levothyroxine  150 mcg Oral QAC breakfast  . LORazepam  0.5 mg Intravenous TID  . mouth rinse  15 mL Mouth Rinse BID  . methylPREDNISolone (SOLU-MEDROL) injection  40 mg Intravenous Daily  . nystatin  5 mL Oral QID  . pravastatin  20 mg Oral q1800  . QUEtiapine  25 mg Oral QHS  . sodium chloride flush  3 mL Intravenous Q12H    Assessment: 73 y.o. female  admitted  with acute on chronic hypoxic and hypercarbic respiratory failure secondary to AECOPD requiring BiPAP  Chest x-ray negative for pneumonia or fluid. Transferred to ICU stepdown for BiPAP 11/11. Prior to this note she has been on apixaban. However, she is unable to swallow oral medications. Her last dose of apixaban was 12/28/18 at 0804 am  Goal of Therapy:  Heparin level 0.3-0.7 units/ml aPTT 66 - 102s Monitor platelets by anticoagulation protocol: Yes   Plan:   Give 4000 units bolus x 1  Start heparin infusion at 1100 units/hr  Check aPTT and anti-Xa level in 8 hours and daily while on heparin  aPTT will be used to guide therapy until aPTT and anti-Xa correlate  Continue to monitor H&H and platelets  Dallie Piles, PharmD 12/29/2018,2:30 PM

## 2018-12-29 NOTE — Progress Notes (Addendum)
Patient ID: Summer Bradley, female   DOB: [birth year], 73 y.o.   MRN: [id number] [facility] PROGRESS NOTE  Summer Bradley [id number] DOB: 1946/01/06 DOA: 12/23/2018 PCP: [place]  HPI/Subjective: Patient sleeping.  Difficult to arouse.  She has not slept for the past few nights but started sleeping this morning around 2 AM as per the daughter and has been asleep since.  She has been tapered off Precedex drip.  Objective: Vitals:   12/29/18 0530 12/29/18 0600  BP: (!) 144/98 (!) 150/92  Pulse: (!) 58 (!) 48  Resp: 18 (!) 22  Temp:    SpO2: 98% 98%    Filed Weights   12/27/18 0606 12/28/18 0302 12/28/18 1819  Weight: 96.3 kg 94.8 kg 95.4 kg    ROS: Review of Systems  Unable to perform ROS: Acuity of condition   Exam: Physical Exam  Constitutional: She appears lethargic.  HENT:  Nose: No mucosal edema.  Mouth/Throat: No oropharyngeal exudate or posterior oropharyngeal edema.  Eyes: Pupils are equal, round, and reactive to light. Conjunctivae, EOM and lids are normal.  Neck: No JVD present. Carotid bruit is not present. No edema present. No thyroid mass and no thyromegaly present.  Cardiovascular: S1 normal and S2 normal. Exam reveals no gallop.  No murmur heard. Pulses:      Dorsalis pedis pulses are 2+ on the right side and 2+ on the left side.  Respiratory: No respiratory distress. She has decreased breath sounds in the right lower field and the left lower field. She has no wheezes. She has no rhonchi. She has no rales.  GI: Soft. Bowel sounds are normal. There is no abdominal tenderness.  Musculoskeletal:     Right ankle: She exhibits swelling.     Left ankle: She exhibits swelling.  Lymphadenopathy:    She has no cervical adenopathy.  Neurological: She appears lethargic.  Skin: Skin is warm. Nails show no clubbing.  Bruising bilateral arms and legs.  Psychiatric:  sleeping      Data Reviewed: Basic Metabolic Panel: Recent  Labs  Lab 12/24/18 0628 12/26/18 0439 12/27/18 0506 12/28/18 0607 12/29/18 0520  NA 140 146* 143 144 145  K 3.5 4.1 4.6 4.2 3.4*  CL 100 105 106 106 104  CO2 26 30 25 26 30   GLUCOSE 195* 132* 206* 189* 221*  BUN 15 33* 37* 42* 44*  CREATININE 0.92 1.25* 0.99 1.01* 1.07*  CALCIUM 8.6* 9.3 9.6 9.9 9.2  MG  --   --   --   --  2.5*  PHOS  --   --   --   --  3.8   Liver Function Tests: Recent Labs  Lab 12/23/18 1645  AST 27  ALT 12  ALKPHOS 48  BILITOT 1.5*  PROT 7.4  ALBUMIN 3.9    CBC: Recent Labs  Lab 12/24/18 0628 12/26/18 0439 12/27/18 0506 12/28/18 0607 12/29/18 0520  WBC 9.9 19.5* 13.6* 19.2* 13.6*  NEUTROABS  --  16.7* 12.6* 17.5* 12.3*  HGB 14.6 14.6 15.0 15.2* 14.7  HCT 46.4* 45.4 47.7* 45.6 45.2  MCV 98.1 96.4 97.5 93.1 94.0  PLT 240 296 289 311 253   BNP (last 3 results) Recent Labs    02/03/18 0611 12/23/18 1340 12/29/18 0521  BNP 183.0* 269.0* 518.0*     CBG: Recent Labs  Lab 12/28/18 1818 12/28/18 2157 12/29/18 0038 12/29/18 0344 12/29/18 0746  GLUCAP 210* 197* 320* 234* 179*    Recent Results (from the  past 240 hour(s))  SARS CORONAVIRUS 2 (TAT 6-24 HRS) Nasopharyngeal Nasopharyngeal Swab     Status: None   Collection Time: 12/23/18 10:30 PM   Specimen: Nasopharyngeal Swab  Result Value Ref Range Status   SARS Coronavirus 2 NEGATIVE NEGATIVE Final    Comment: (NOTE) SARS-CoV-2 target nucleic acids are NOT DETECTED. The SARS-CoV-2 RNA is generally detectable in upper and lower respiratory specimens during the acute phase of infection. Negative results do not preclude SARS-CoV-2 infection, do not rule out co-infections with other pathogens, and should not be used as the sole basis for treatment or other patient management decisions. Negative results must be combined with clinical observations, patient history, and epidemiological information. The expected result is Negative. Fact Sheet for  Patients: SugarRoll.be Fact Sheet for Healthcare Providers: https://www.woods-mathews.com/ This test is not yet approved or cleared by the [place] FDA and  has been authorized for detection and/or diagnosis of SARS-CoV-2 by FDA under an Emergency Use Authorization (EUA). This EUA will remain  in effect (meaning this test can be used) for the duration of the COVID-19 declaration under Section 56 4(b)(1) of the Act, 21 U.S.C. section 360bbb-3(b)(1), unless the authorization is terminated or revoked sooner. Performed at [facility], [place] [address] [place], [place] [address]   Culture, blood (routine x 2) Call MD if unable to obtain prior to antibiotics being given     Status: Abnormal   Collection Time: 12/24/18  1:42 AM   Specimen: BLOOD  Result Value Ref Range Status   Specimen Description   Final    BLOOD RIGHT FOREARM Performed at [facility], [address], [place], [place] [address]    Special Requests   Final    BOTTLES DRAWN AEROBIC AND ANAEROBIC Blood Culture adequate volume Performed at [facility], [address] [place], [place] [address]    Culture  Setup Time   Final    AEROBIC BOTTLE ONLY GRAM POSITIVE COCCI CRITICAL RESULT CALLED TO, READ BACK BY AND VERIFIED WITH: ROBBINS,J AT 2046 ON 12/24/2018 BY MOSLEY,J GRAM POSITIVE RODS ANAEROBIC BOTTLE ONLY CRITICAL RESULT CALLED TO, READ BACK BY AND VERIFIED WITH: DAVID BESANTI AT [place] ON 12/25/18 RWW    Culture (A)  Final    STAPHYLOCOCCUS SPECIES (COAGULASE NEGATIVE) THE SIGNIFICANCE OF ISOLATING THIS ORGANISM FROM A SINGLE SET OF BLOOD CULTURES WHEN MULTIPLE SETS ARE DRAWN IS UNCERTAIN. PLEASE NOTIFY THE MICROBIOLOGY DEPARTMENT WITHIN ONE WEEK IF SPECIATION AND SENSITIVITIES ARE REQUIRED. DIPHTHEROIDS(CORYNEBACTERIUM SPECIES) Standardized susceptibility testing for this organism is not available. Performed at [facility], [place]  [address], [place], [place] [address]    Report Status 12/26/2018 FINAL  Final  Blood Culture ID Panel (Reflexed)     Status: Abnormal   Collection Time: 12/24/18  1:42 AM  Result Value Ref Range Status   Enterococcus species NOT DETECTED NOT DETECTED Final   Listeria monocytogenes NOT DETECTED NOT DETECTED Final   Staphylococcus species DETECTED (A) NOT DETECTED Final    Comment: Methicillin (oxacillin) resistant coagulase negative staphylococcus. Possible blood culture contaminant (unless isolated from more than one blood culture draw or clinical case suggests pathogenicity). No antibiotic treatment is indicated for blood  culture contaminants. CRITICAL RESULT CALLED TO, READ BACK BY AND VERIFIED WITH: ROBBINS,J AT 2046 ON 12/24/2018 BY MOSLEY,J    Staphylococcus aureus (BCID) NOT DETECTED NOT DETECTED Final   Methicillin resistance DETECTED (A) NOT DETECTED Final    Comment: CRITICAL RESULT CALLED TO, READ BACK BY AND  VERIFIED WITH: ROBBINS,J AT 2046 ON 12/24/2018 BY MOSLEY,J    Streptococcus species NOT DETECTED NOT DETECTED Final   Streptococcus agalactiae NOT DETECTED NOT DETECTED Final   Streptococcus pneumoniae NOT DETECTED NOT DETECTED Final   Streptococcus pyogenes NOT DETECTED NOT DETECTED Final   Acinetobacter baumannii NOT DETECTED NOT DETECTED Final   Enterobacteriaceae species NOT DETECTED NOT DETECTED Final   Enterobacter cloacae complex NOT DETECTED NOT DETECTED Final   Escherichia coli NOT DETECTED NOT DETECTED Final   Klebsiella oxytoca NOT DETECTED NOT DETECTED Final   Klebsiella pneumoniae NOT DETECTED NOT DETECTED Final   Proteus species NOT DETECTED NOT DETECTED Final   Serratia marcescens NOT DETECTED NOT DETECTED Final   Haemophilus influenzae NOT DETECTED NOT DETECTED Final   Neisseria meningitidis NOT DETECTED NOT DETECTED Final   Pseudomonas aeruginosa NOT DETECTED NOT DETECTED Final   Candida albicans NOT DETECTED NOT DETECTED Final   Candida glabrata NOT  DETECTED NOT DETECTED Final   Candida krusei NOT DETECTED NOT DETECTED Final   Candida parapsilosis NOT DETECTED NOT DETECTED Final   Candida tropicalis NOT DETECTED NOT DETECTED Final    Comment: Performed at [facility], [address] [place], [place] [address]  Culture, blood (routine x 2)     Status: None (Preliminary result)   Collection Time: 12/25/18  9:25 AM   Specimen: BLOOD  Result Value Ref Range Status   Specimen Description BLOOD RIGHT ANTECUBITAL  Final   Special Requests   Final    BOTTLES DRAWN AEROBIC AND ANAEROBIC Blood Culture results may not be optimal due to an inadequate volume of blood received in culture bottles   Culture   Final    NO GROWTH 4 DAYS Performed at [facility], [address], [place], [place] [address]    Report Status PENDING  Incomplete  Culture, blood (routine x 2)     Status: None (Preliminary result)   Collection Time: 12/25/18  9:34 AM   Specimen: BLOOD  Result Value Ref Range Status   Specimen Description BLOOD BLOOD RIGHT FOREARM  Final   Special Requests   Final    BOTTLES DRAWN AEROBIC AND ANAEROBIC Blood Culture adequate volume   Culture   Final    NO GROWTH 4 DAYS Performed at [facility], [address] [place], [place] [address]    Report Status PENDING  Incomplete  MRSA PCR Screening     Status: None   Collection Time: 12/28/18  6:26 PM   Specimen: Nasal Mucosa; Nasopharyngeal  Result Value Ref Range Status   MRSA by PCR NEGATIVE NEGATIVE Final    Comment:        The GeneXpert MRSA Assay (FDA approved for NASAL specimens only), is one component of a comprehensive MRSA colonization surveillance program. It is not intended to diagnose MRSA infection nor to guide or monitor treatment for MRSA infections. Performed at [facility], [address] [place],  [address]       Scheduled Meds: . apixaban  5 mg Oral BID  . bisoprolol  5 mg Oral BID  . budesonide  (PULMICORT) nebulizer solution  0.5 mg Nebulization BID  . busPIRone  15 mg Oral BID  . calcium-vitamin D  1 tablet Oral BID  . Chlorhexidine Gluconate Cloth  6 each Topical Daily  . citalopram  20 mg Oral QHS  . diltiazem  300 mg Oral Daily  . famotidine  20 mg Oral BID  . guaiFENesin  600 mg Oral  BID  . insulin aspart  0-20 Units Subcutaneous Q4H  . ipratropium-albuterol  3 mL Nebulization Q6H  . levothyroxine  150 mcg Oral QAC breakfast  . LORazepam  0.5 mg Intravenous TID  . mouth rinse  15 mL Mouth Rinse BID  . methylPREDNISolone (SOLU-MEDROL) injection  40 mg Intravenous Daily  . nystatin  5 mL Oral QID  . pravastatin  20 mg Oral q1800  . QUEtiapine  25 mg Oral QHS  . sodium chloride flush  3 mL Intravenous Q12H   Continuous Infusions: . sodium chloride Stopped (12/27/18 2247)  . dexmedetomidine (PRECEDEX) IV infusion 1.2 mcg/kg/hr (12/29/18 0600)  . doxycycline (VIBRAMYCIN) IV 100 mg (12/29/18 1000)    Assessment/Plan:  1. Acute on chronic hypoxic respiratory failure.  Chest x-ray negative for pneumonia or fluid.  Transferred to ICU stepdown for BiPAP yesterday.  Appreciate critical care consultation.  Continue Solu-Medrol. Currently on nasal canula 3 L of oxygen. 2. Acute delirium with underlying dementia.  The patient has been sleeping since 2 AM as per daughter.  Continue Seroquel 25 mg nightly.  As needed Haldol IV.  Patient does take BuSpar Celexa and as needed oral Ativan as outpatient.  Precedex drip now on hold. 3. COPD exacerbation.  Continue Solu-Medrol, nebulizers and inhalers.  Patient on doxycycline. 4. Paroxysmal atrial fibrillation with rapid ventricular response.  Patient was on Cardizem drip and now over to bisoprolol and Cardizem CD.  Patient on Eliquis for anticoagulation.  If the patient does not wake up to take her Eliquis may need to start heparin drip this afternoon. 5. Blood culture with corynebacterium is likely a skin contamination. 6. Hypertension on  Cardizem and bisoprolol 7. Hypothyroidism unspecified on Synthroid 8. History of ischemic stroke on Eliquis and lovastatin 9. Anxiety and depression on Celexa  Code Status:     Code Status Orders  (From admission, onward)         Start     Ordered   12/23/18 2226  Full code  Continuous     12/23/18 2233        Code Status History    Date Active Date Inactive Code Status Order ID Comments User Context   02/03/2018 0934 02/10/2018 1954 Full Code [id number]  Arta Silence, MD Inpatient   02/02/2014 0933 03/05/2014 1624 Full Code [id number]  Wilhelmina Mcardle, MD Inpatient   02/01/2014 1236 02/02/2014 0933 Full Code [id number]  Rob Hickman, MD Inpatient   01/29/2014 1119 02/01/2014 1236 Full Code [id number]  Roland Rack, MD Inpatient   Advance Care Planning Activity    Advance Directive Documentation     Most Recent Value  Type of Advance Directive  Healthcare Power of Attorney  Pre-existing out of facility DNR order (yellow form or pink MOST form)  -  "MOST" Form in Place?  -     Family Communication: Spoke with daughter at the bedside Disposition Plan: To be determined  Time spent: 27 minutes.  Case discussed with nursing staff.  [place]  [facility] [employer]

## 2018-12-29 NOTE — Progress Notes (Signed)
OT Cancellation Note  Patient Details Name: Summer Bradley MRN: [id number] DOB: May 27, 1945   Cancelled Treatment:    Reason Eval/Treat Not Completed: Medical issues which prohibited therapy  Per chart review, patient transferred to ICU over night and will need new OT orders to evaluate when patient is appropriate and medically stable.  Will complete order for OT evaluation and monitor for new orders.  Chrys Racer, OTR/L, NTMTC ascom [phone] 12/29/18, 8:20 AM

## 2018-12-30 LAB — BASIC METABOLIC PANEL
Anion gap: 10 (ref 5–15)
BUN: 42 mg/dL — ABNORMAL HIGH (ref 8–23)
CO2: 29 mmol/L (ref 22–32)
Calcium: 9 mg/dL (ref 8.9–10.3)
Chloride: 109 mmol/L (ref 98–111)
Creatinine, Ser: 0.94 mg/dL (ref 0.44–1.00)
GFR calc Af Amer: >60 mL/min (ref >60)
GFR calc non Af Amer: 60 mL/min — ABNORMAL LOW (ref >60)
Glucose, Bld: 123 mg/dL — ABNORMAL HIGH (ref 70–99)
Potassium: 3.8 mmol/L (ref 3.5–5.1)
Sodium: 148 mmol/L — ABNORMAL HIGH (ref 135–145)

## 2018-12-30 LAB — APTT
aPTT: >160 seconds (ref 24–36)
aPTT: 131 seconds — ABNORMAL HIGH (ref 24–36)
aPTT: 75 seconds — ABNORMAL HIGH (ref 24–36)

## 2018-12-30 LAB — CULTURE, BLOOD (ROUTINE X 2)
Culture: NO GROWTH
Culture: NO GROWTH
Special Requests: ADEQUATE

## 2018-12-30 LAB — GLUCOSE, CAPILLARY
Glucose-Capillary: 110 mg/dL — ABNORMAL HIGH (ref 70–99)
Glucose-Capillary: 130 mg/dL — ABNORMAL HIGH (ref 70–99)
Glucose-Capillary: 105 mg/dL — ABNORMAL HIGH (ref 70–99)
Glucose-Capillary: 115 mg/dL — ABNORMAL HIGH (ref 70–99)
Glucose-Capillary: 116 mg/dL — ABNORMAL HIGH (ref 70–99)
Glucose-Capillary: 125 mg/dL — ABNORMAL HIGH (ref 70–99)

## 2018-12-30 LAB — CBC
HCT: 45.1 % (ref 36.0–46.0)
Hemoglobin: 14.8 g/dL (ref 12.0–15.0)
MCH: 31 pg (ref 26.0–34.0)
MCHC: 32.8 g/dL (ref 30.0–36.0)
MCV: 94.4 fL (ref 80.0–100.0)
Platelets: 253 10*3/uL (ref 150–400)
RBC: 4.78 MIL/uL (ref 3.87–5.11)
RDW: 14.8 % (ref 11.5–15.5)
WBC: 17.6 10*3/uL — ABNORMAL HIGH (ref 4.0–10.5)
nRBC: 0.2 % (ref 0.0–0.2)

## 2018-12-30 LAB — HEPARIN LEVEL (UNFRACTIONATED): Heparin Unfractionated: 1.54 IU/mL — ABNORMAL HIGH (ref 0.30–0.70)

## 2018-12-30 LAB — MAGNESIUM: Magnesium: 2.8 mg/dL — ABNORMAL HIGH (ref 1.7–2.4)

## 2018-12-30 MED ORDER — FAMOTIDINE IN NACL 20-0.9 MG/50ML-% IV SOLN
20.0000 mg | Freq: Two times a day (BID) | INTRAVENOUS | Status: DC
  Administered 2018-12-30 – 2019-01-01 (×6): 20 mg via INTRAVENOUS
  Filled 2018-12-30 (×6): qty 50

## 2018-12-30 MED ORDER — DILTIAZEM HCL 100 MG IV SOLR
INTRAVENOUS | Status: AC
  Administered 2018-12-30: 09:00:00 5 mg/h via INTRAVENOUS
  Filled 2018-12-30: qty 100

## 2018-12-30 MED ORDER — DILTIAZEM HCL 100 MG IV SOLR
5.0000 mg/h | INTRAVENOUS | Status: DC
  Administered 2018-12-30 – 2018-12-31 (×3): 5 mg/h via INTRAVENOUS
  Filled 2018-12-30 (×2): qty 100

## 2018-12-30 MED ORDER — HEPARIN (PORCINE) 25000 UT/250ML-% IV SOLN
800.0000 [IU]/h | INTRAVENOUS | Status: DC
  Administered 2018-12-30: 600 [IU]/h via INTRAVENOUS
  Administered 2019-01-01: 800 [IU]/h via INTRAVENOUS
  Filled 2018-12-30 (×2): qty 250

## 2018-12-30 MED ORDER — CLONIDINE HCL 0.3 MG/24HR TD PTWK
0.3000 mg | MEDICATED_PATCH | TRANSDERMAL | Status: DC
  Administered 2018-12-30: 13:00:00 0.3 mg via TRANSDERMAL
  Filled 2018-12-30: qty 1

## 2018-12-30 NOTE — Consult Note (Signed)
ANTICOAGULATION CONSULT NOTE  Pharmacy Consult for heparin infusion Indication: atrial fibrillation  Patient Measurements: Height: 5\' 3"  (160 cm) Weight: 210 lb 5.1 oz (95.4 kg) IBW/kg (Calculated) : 52.4 Heparin Dosing Weight: 74.5 kg  Vital Signs: Temp: 98.6 F (37 C) (11/13 0700) BP: 151/104 (11/13 0600) Pulse Rate: 94 (11/13 0700)  Labs: Recent Labs    12/28/18 0607 12/29/18 0520 12/29/18 1511 12/29/18 2344 12/30/18 0450  HGB 15.2* 14.7  --   --  14.8  HCT 45.6 45.2  --   --  45.1  PLT 311 253  --   --  253  APTT  --   --  29 >160*  --   LABPROT  --   --  16.4*  --   --   INR  --   --  1.3*  --   --   HEPARINUNFRC  --   --  2.54* 1.54*  --   CREATININE 1.01* 1.07*  --   --  0.94    Estimated Creatinine Clearance: 57.7 mL/min (by C-G formula based on SCr of 0.94 mg/dL).   Medical History: Past Medical History:  Diagnosis Date  . A-fib ([place])   . Anxiety   . COPD (chronic obstructive pulmonary disease) ([place])   . Cough    CHONIC  . Depression   . Dysrhythmia   . GERD (gastroesophageal reflux disease)   . Heart disease   . High cholesterol   . Hypertension   . Hypothyroidism   . Orthopnea   . Oxygen dependent    2/L CONTINUOUSLY  . Stroke ([place])   . Vertigo   . Wheezing     Medications:  Scheduled:  . bisoprolol  5 mg Oral BID  . budesonide (PULMICORT) nebulizer solution  0.5 mg Nebulization BID  . calcium-vitamin D  1 tablet Oral BID  . Chlorhexidine Gluconate Cloth  6 each Topical Daily  . citalopram  20 mg Oral QHS  . diltiazem  300 mg Oral Daily  . famotidine  20 mg Oral BID  . guaiFENesin  600 mg Oral BID  . insulin aspart  0-20 Units Subcutaneous Q4H  . ipratropium-albuterol  3 mL Nebulization Q6H  . levothyroxine  150 mcg Oral QAC breakfast  . LORazepam  0.5 mg Intravenous TID  . mouth rinse  15 mL Mouth Rinse BID  . nystatin  5 mL Oral QID  . pravastatin  20 mg Oral q1800  . QUEtiapine  25 mg Oral QHS  . sodium chloride flush  3 mL  Intravenous Q12H    Assessment: 73 y.o. female  admitted with acute on chronic hypoxic and hypercarbic respiratory failure secondary to AECOPD requiring BiPAP  Chest x-ray negative for pneumonia or fluid. Transferred to ICU stepdown for BiPAP 11/11. Prior to this note she has been on apixaban. However, she is unable to swallow oral medications. Her last dose of apixaban was 12/28/18 at 0804 am. H&H, PLT currently wnl  Heparin Course: 11/12 initiation: 4000 unit bolus, then 1100 units/hr 11/12 2344 aPTT > 160, HL 1.54 reduce rate to 900 units/hr 11/12 1056 aPTT 131s: reduce rate to 600 units/hr  Goal of Therapy:  Heparin level 0.3-0.7 units/ml aPTT 66 - 102s Monitor platelets by anticoagulation protocol: Yes   Plan:   reduce heparin infusion to 600 units/hr  Check aPTT 8 hours after heparin rate change  aPTT will be used to guide therapy until aPTT and anti-Xa correlate. Next heparin level in am  Continue to monitor  H&H and platelets  Dallie Piles, PharmD 12/30/2018,7:10 AM

## 2018-12-30 NOTE — Progress Notes (Signed)
Follow up - Critical Care Medicine Note  Patient Details:    Summer Bradley is an 73 y.o. female  admitted with acute on chronic hypoxic and hypercarbic respiratory failure secondary to AECOPD requiring BiPAP.  She has very severe COPD (stage IV) and has deteriorated due to delirium.     Events: 11/6: Pt admitted to the telemetry unit  11/6: CTA Chest revealed evaluation for pulmonary emboli is limited by motion artifact. Given this limitation, no PE was identified. Cardiomegaly with coronary artery disease. Moderate-sized hiatal hernia. Cholelithiasis without CT evidence for acute cholecystitis. Emphysema and aortic atherosclerosis.Aortic Atherosclerosis (ICD10-I70.0) and Emphysema (ICD10-J43.9). 11/7: CT Head/Cervical Spine revealed small left frontal scalp contusion. No calvarial fracture. No acute intracranial abnormality. No acute fracture or dislocation of cervical spine. Large chronic right MCA distribution infarction as well as small infarctions of right cerebellar hemisphere and left thalamus. Advanced cervical spondylosis with prominent left-sided facet arthropathy. 11/8: Echo revealed left ventricular ejection fraction, by visual estimation, is 55 to 60%. The left ventricle has normal function. There is no left ventricular hypertrophy. Global right ventricle has normal systolic function.The right ventricular size is mildly enlarged. No increase in right ventricular wall thickness. 11/11: Rapid response initiated due to pt developing worsening acute on chronic respiratory failure and encephalopathy requiring transfer to the stepdown unit for BiPAP 11/12: Lethargic today.  Required Precedex due to delirium, this is now off. 11/13 - on 0.8 of precedex for aggitation, heparin drip for AF, cardizem drip for accelerated htn.  Patient is DNR.  Palliative GOC done - family wants to keep trying for now    Lines, Airways, Drains: Urethral Catheter [name] Latex 16 Fr. (Active)   Indication for Insertion or Continuance of Catheter Acute urinary retention (I&O Cath for 24 hrs prior to catheter insertion- Inpatient Only) 12/29/18 1400  Site Assessment Clean;Intact 12/29/18 1400  Date Prophylactic Dressing Applied (if applicable) [id number]  Catheter Maintenance Bag below level of bladder;Catheter secured;Drainage bag/tubing not touching floor;Insertion date on drainage bag;No dependent loops;Seal intact 12/29/18 1400  Collection Container Standard drainage bag 12/29/18 1400  Securement Method Securing device (Describe) 12/29/18 1400  Output (mL) 125 mL 12/29/18 1200    Anti-infectives:  Anti-infectives (From admission, onward)   Start     Dose/Rate Route Frequency Ordered Stop   12/28/18 2045  doxycycline (VIBRAMYCIN) 100 mg in sodium chloride 0.9 % 250 mL IVPB     100 mg 125 mL/hr over 120 Minutes Intravenous Every 12 hours 12/28/18 2038     12/26/18 2200  doxycycline (VIBRA-TABS) tablet 100 mg  Status:  Discontinued     100 mg Oral Every 12 hours 12/26/18 1655 12/28/18 2038   12/25/18 2200  vancomycin (VANCOCIN) IVPB 1000 mg/200 mL premix  Status:  Discontinued     1,000 mg 200 mL/hr over 60 Minutes Intravenous Every 24 hours 12/24/18 2125 12/25/18 1107   12/24/18 2130  vancomycin (VANCOCIN) 2,000 mg in sodium chloride 0.9 % 500 mL IVPB     2,000 mg 250 mL/hr over 120 Minutes Intravenous  Once 12/24/18 2118 12/25/18 0014   12/24/18 1000  azithromycin (ZITHROMAX) tablet 500 mg  Status:  Discontinued     500 mg Oral Daily 12/23/18 2233 12/24/18 0056   12/24/18 1000  doxycycline (VIBRA-TABS) tablet 100 mg  Status:  Discontinued     100 mg Oral Every 12 hours 12/24/18 0159 12/24/18 2118   12/24/18 0100  doxycycline (VIBRA-TABS) tablet 100 mg  Status:  Discontinued  100 mg Oral Every 12 hours 12/24/18 0056 12/24/18 0159   12/23/18 2245  cefTRIAXone (ROCEPHIN) 1 g in sodium chloride 0.9 % 100 mL IVPB     1 g 200 mL/hr over 30 Minutes Intravenous  Every 24 hours 12/23/18 2233 12/27/18 2049      Microbiology: Results for orders placed or performed during the hospital encounter of 12/23/18  SARS CORONAVIRUS 2 (TAT 6-24 HRS) Nasopharyngeal Nasopharyngeal Swab     Status: None   Collection Time: 12/23/18 10:30 PM   Specimen: Nasopharyngeal Swab  Result Value Ref Range Status   SARS Coronavirus 2 NEGATIVE NEGATIVE Final    Comment: (NOTE) SARS-CoV-2 target nucleic acids are NOT DETECTED. The SARS-CoV-2 RNA is generally detectable in upper and lower respiratory specimens during the acute phase of infection. Negative results do not preclude SARS-CoV-2 infection, do not rule out co-infections with other pathogens, and should not be used as the sole basis for treatment or other patient management decisions. Negative results must be combined with clinical observations, patient history, and epidemiological information. The expected result is Negative. Fact Sheet for Patients: SugarRoll.be Fact Sheet for Healthcare Providers: https://www.woods-mathews.com/ This test is not yet approved or cleared by the [place] FDA and  has been authorized for detection and/or diagnosis of SARS-CoV-2 by FDA under an Emergency Use Authorization (EUA). This EUA will remain  in effect (meaning this test can be used) for the duration of the COVID-19 declaration under Section 56 4(b)(1) of the Act, 21 U.S.C. section 360bbb-3(b)(1), unless the authorization is terminated or revoked sooner. Performed at [facility], [place] [address], [place], [place] [address]   Culture, blood (routine x 2) Call MD if unable to obtain prior to antibiotics being given     Status: Abnormal   Collection Time: 12/24/18  1:42 AM   Specimen: BLOOD  Result Value Ref Range Status   Specimen Description   Final    BLOOD RIGHT FOREARM Performed at [facility], [address], [place], [place] [address]    Special  Requests   Final    BOTTLES DRAWN AEROBIC AND ANAEROBIC Blood Culture adequate volume Performed at [facility], [address] [place], [place] [address]    Culture  Setup Time   Final    AEROBIC BOTTLE ONLY GRAM POSITIVE COCCI CRITICAL RESULT CALLED TO, READ BACK BY AND VERIFIED WITH: ROBBINS,J AT 2046 ON 12/24/2018 BY MOSLEY,J GRAM POSITIVE RODS ANAEROBIC BOTTLE ONLY CRITICAL RESULT CALLED TO, READ BACK BY AND VERIFIED WITH: DAVID BESANTI AT [place] ON 12/25/18 RWW    Culture (A)  Final    STAPHYLOCOCCUS SPECIES (COAGULASE NEGATIVE) THE SIGNIFICANCE OF ISOLATING THIS ORGANISM FROM A SINGLE SET OF BLOOD CULTURES WHEN MULTIPLE SETS ARE DRAWN IS UNCERTAIN. PLEASE NOTIFY THE MICROBIOLOGY DEPARTMENT WITHIN ONE WEEK IF SPECIATION AND SENSITIVITIES ARE REQUIRED. DIPHTHEROIDS(CORYNEBACTERIUM SPECIES) Standardized susceptibility testing for this organism is not available. Performed at [facility], [place] [address] [place], [place] [address]    Report Status 12/26/2018 FINAL  Final  Blood Culture ID Panel (Reflexed)     Status: Abnormal   Collection Time: 12/24/18  1:42 AM  Result Value Ref Range Status   Enterococcus species NOT DETECTED NOT DETECTED Final   Listeria monocytogenes NOT DETECTED NOT DETECTED Final   Staphylococcus species DETECTED (A) NOT DETECTED Final    Comment: Methicillin (oxacillin) resistant coagulase negative staphylococcus. Possible blood culture contaminant (unless isolated from more than one blood culture draw or clinical case  suggests pathogenicity). No antibiotic treatment is indicated for blood  culture contaminants. CRITICAL RESULT CALLED TO, READ BACK BY AND VERIFIED WITH: ROBBINS,J AT 2046 ON 12/24/2018 BY MOSLEY,J    Staphylococcus aureus (BCID) NOT DETECTED NOT DETECTED Final   Methicillin resistance DETECTED (A) NOT DETECTED Final    Comment: CRITICAL RESULT CALLED TO, READ BACK BY AND VERIFIED WITH: ROBBINS,J AT 2046 ON 12/24/2018 BY  MOSLEY,J    Streptococcus species NOT DETECTED NOT DETECTED Final   Streptococcus agalactiae NOT DETECTED NOT DETECTED Final   Streptococcus pneumoniae NOT DETECTED NOT DETECTED Final   Streptococcus pyogenes NOT DETECTED NOT DETECTED Final   Acinetobacter baumannii NOT DETECTED NOT DETECTED Final   Enterobacteriaceae species NOT DETECTED NOT DETECTED Final   Enterobacter cloacae complex NOT DETECTED NOT DETECTED Final   Escherichia coli NOT DETECTED NOT DETECTED Final   Klebsiella oxytoca NOT DETECTED NOT DETECTED Final   Klebsiella pneumoniae NOT DETECTED NOT DETECTED Final   Proteus species NOT DETECTED NOT DETECTED Final   Serratia marcescens NOT DETECTED NOT DETECTED Final   Haemophilus influenzae NOT DETECTED NOT DETECTED Final   Neisseria meningitidis NOT DETECTED NOT DETECTED Final   Pseudomonas aeruginosa NOT DETECTED NOT DETECTED Final   Candida albicans NOT DETECTED NOT DETECTED Final   Candida glabrata NOT DETECTED NOT DETECTED Final   Candida krusei NOT DETECTED NOT DETECTED Final   Candida parapsilosis NOT DETECTED NOT DETECTED Final   Candida tropicalis NOT DETECTED NOT DETECTED Final    Comment: Performed at [facility], [address] [place], [place] [address]  Culture, blood (routine x 2)     Status: None   Collection Time: 12/25/18  9:25 AM   Specimen: BLOOD  Result Value Ref Range Status   Specimen Description BLOOD RIGHT ANTECUBITAL  Final   Special Requests   Final    BOTTLES DRAWN AEROBIC AND ANAEROBIC Blood Culture results may not be optimal due to an inadequate volume of blood received in culture bottles   Culture   Final    NO GROWTH 5 DAYS Performed at [facility], [address] [place], [place] [address]    Report Status 12/30/2018 FINAL  Final  Culture, blood (routine x 2)     Status: None   Collection Time: 12/25/18  9:34 AM   Specimen: BLOOD  Result Value Ref Range Status   Specimen Description BLOOD BLOOD RIGHT FOREARM   Final   Special Requests   Final    BOTTLES DRAWN AEROBIC AND ANAEROBIC Blood Culture adequate volume   Culture   Final    NO GROWTH 5 DAYS Performed at [facility], [address] [place], [place] [address]    Report Status 12/30/2018 FINAL  Final  MRSA PCR Screening     Status: None   Collection Time: 12/28/18  6:26 PM   Specimen: Nasal Mucosa; Nasopharyngeal  Result Value Ref Range Status   MRSA by PCR NEGATIVE NEGATIVE Final    Comment:        The GeneXpert MRSA Assay (FDA approved for NASAL specimens only), is one component of a comprehensive MRSA colonization surveillance program. It is not intended to diagnose MRSA infection nor to guide or monitor treatment for MRSA infections. Performed at [facility], [address] [place], [place] [address]     Best Practice/Protocols:  VTE Prophylaxis: Heparin (drip) GI Prophylaxis: Antihistamine    CULTURES: Blood x 2 11/8>>negative  Blood 11/8>>1/4 GPC staph species Mec A +, now another  1/4 (2/4 total) GPR  MRSA 11/11>>negative    Studies: Dg Chest 2 View  Result Date: 12/23/2018 CLINICAL DATA:  Shortness of breath EXAM: CHEST - 2 VIEW COMPARISON:  February 06, 2018 and December 23, 2015 FINDINGS: There is atelectatic change in the right base region. There is mild stable interstitial thickening. There is no frank edema or consolidation. The heart is upper normal in size with pulmonary vascularity normal. No adenopathy. There is degenerative change in the thoracic spine. IMPRESSION: Stable interstitial thickening, likely representing a degree of chronic inflammatory change/underlying fibrosis. Mild atelectasis right base. No edema or consolidation. Stable cardiac silhouette. No evident adenopathy. Electronically Signed   By: Lowella Grip III M.D.   On: 12/23/2018 14:42   Ct Angio Chest Pe W And/or Wo Contrast  Result Date: 12/23/2018 CLINICAL DATA:  Shortness of breath with concern for PE. EXAM:  CT ANGIOGRAPHY CHEST WITH CONTRAST TECHNIQUE: Multidetector CT imaging of the chest was performed using the standard protocol during bolus administration of intravenous contrast. Multiplanar CT image reconstructions and MIPs were obtained to evaluate the vascular anatomy. CONTRAST:  62mL OMNIPAQUE IOHEXOL 350 MG/ML SOLN COMPARISON:  None. FINDINGS: Cardiovascular: Evaluation for pulmonary emboli is limited by motion artifact. Given this limitation, no PE was identified. The main pulmonary artery is dilated measuring approximately 3.4 cm in diameter. There is no CT evidence of acute right heart strain. There are atherosclerotic changes of the thoracic aorta. The descending aorta is ectatic measuring up to approximately 3.6 cm in diameter. No evidence for an aneurysm. Heart size is enlarged. Coronary artery calcifications are noted. Mediastinum/Nodes: --No mediastinal or hilar lymphadenopathy. --No axillary lymphadenopathy. --No supraclavicular lymphadenopathy. --Normal thyroid gland. --The esophagus is unremarkable Lungs/Pleura: Emphysematous changes are noted bilaterally. There is no pneumothorax. No large pleural effusion. No focal infiltrate. Upper Abdomen: There is cholelithiasis without CT evidence for acute cholecystitis. There is a moderate-sized hiatal hernia. Musculoskeletal: No chest wall abnormality. No acute or significant osseous findings. Review of the MIP images confirms the above findings. IMPRESSION: 1. Evaluation for pulmonary emboli is limited by motion artifact. Given this limitation, no PE was identified. 2. Cardiomegaly with coronary artery disease. 3. Moderate-sized hiatal hernia. 4. Cholelithiasis without CT evidence for acute cholecystitis. 5. Emphysema and aortic atherosclerosis. Aortic Atherosclerosis (ICD10-I70.0) and Emphysema (ICD10-J43.9). Electronically Signed   By: Constance Holster M.D.   On: 12/23/2018 19:59   Dg Chest Port 1 View  Result Date: 12/28/2018 CLINICAL DATA:   Dyspnea, COPD EXAM: PORTABLE CHEST 1 VIEW COMPARISON:  12/24/2018 chest radiograph. FINDINGS: Stable cardiomediastinal silhouette with mild cardiomegaly. No pneumothorax. No pleural effusion. Emphysema. No overt pulmonary edema. No acute consolidative airspace disease. IMPRESSION: Stable mild cardiomegaly without overt pulmonary edema. No acute pulmonary disease. Emphysema. Electronically Signed   By: Ilona Sorrel M.D.   On: 12/28/2018 17:32   Dg Chest Port 1 View  Result Date: 12/24/2018 CLINICAL DATA:  Cough, SOB, weakness. Hx of wheezing, HTN, COPD, a-fib. Former smoker. EXAM: PORTABLE CHEST 1 VIEW COMPARISON:  Chest radiograph 02/06/2018 FINDINGS: Stable cardiomediastinal contours. Mildly enlarged heart size. Bilateral diffuse coarse interstitial opacities with basilar predominance likely representing chronic lung disease. No new focal opacity. No pneumothorax or large pleural effusion. No acute finding in the visualized skeleton. IMPRESSION: Stable chest x-ray with chronic coarse interstitial opacities, basilar predominance. No acute cardiopulmonary findings. Electronically Signed   By: Audie Pinto M.D.   On: 12/24/2018 15:04    Consults: Treatment Team:  Nelva Bush, MD Tyler Pita, MD  Subjective:    Overnight Issues: Quiet Precedex overnight.  Was finally able to sleep at 2 AM.  She has not slept "in days".  Patient uses Ativan as an outpatient.  Note that she is also on BuSpar.   Objective:  Vital signs for last 24 hours: Temp:  [94.2 F (34.6 C)-99.5 F (37.5 C)] 98.6 F (37 C) (11/13 0700) Pulse Rate:  [25-162] 94 (11/13 0700) Resp:  [19-38] 22 (11/13 0700) BP: (119-162)/(74-117) 162/113 (11/13 0725) SpO2:  [96 %-100 %] 98 % (11/13 0736) FiO2 (%):  [30 %] 30 % (11/13 0736)  Hemodynamic parameters for last 24 hours:    Intake/Output from previous day: 11/12 0701 - 11/13 0700 In: 773.4 [I.V.:218.5; IV Piggyback:554.9] Out: 1765  [Urine:1765]  Intake/Output this shift: No intake/output data recorded.  Vent settings for last 24 hours: FiO2 (%):  [30 %] 30 %  Physical Exam:  General:  Chronically ill appearing female, currently on nasal cannula at 4 L/min, lethargic Neuro:  Lethargic, not following commands, PERRL  HEENT: No JVD present  Cardiovascular: irregular irregular, no R/G  Lungs: diminished throughout, no wheezes, poor air movement bilaterally Abdomen: +BS x4, obese, soft, non tender, non distended  Musculoskeletal: normal bulk and tone, trace generalized edema  Skin: diffuse scattered ecchymosis, bilateral upper extremity skin tears   Assessment/Plan:    Acute on chronic hypoxic and hypercarbic respiratory failure secondary AECOPD  Hx: Cough, Chronic Home O2 @2L , and Orthopnea  Cor pulmonale/pulmonary hypertension On RA with spO2 99 requiring sedation Scheduled and PRN  bronchodilator therapy  stop doxycycline   Lasix again-mild aki and clear cxr - stopping diuretic Nebulized steroids  PRN morphine for air hunger  -completed 5 days of rocephin, 1 day of vancomycin, and now 6 days of doxycycline -will stop anbibiotics now  Paroxysmal atrial fibrillation  Hx: HTN, Stroke, and HLD Continuous telemetry monitoring  PRN IV metoprolol for heart rate control  Continue outpatient cardiac medications once pt able to tolerate po  Cardiology following-appreciate input   Leukocytosis: 2/4 Blood Cultures positive for staph species Mec A possible contaminant  Trend WBC and monitor fever curve Trend PCT Follow cultures  Abx : Ceftriaxone 11/6>>11/10 Vancomycin 11/7>>11/8 Doxycycline 11/9>>  Hyperglycemia  CBG's q4hrs  SSI   Acute encephalopathy pt with underlying dementia  Frequent reorientation  Precedex gtt      Critical care provider statement:    Critical care time (minutes):  32   Critical care time was exclusive of:  Separately billable procedures and  treating other patients    Critical care was necessary to treat or prevent imminent or  life-threatening deterioration of the following conditions:  Acute copd exacerbation, paroxysmal atrial fibrillation, pulmonary arterial hypertension, mobid obesity, multiple comorbid conditions   Critical care was time spent personally by me on the following  activities:  Development of treatment plan with patient or surrogate,  discussions with consultants, evaluation of patient's response to  treatment, examination of patient, obtaining history from patient or  surrogate, ordering and performing treatments and interventions, ordering  and review of laboratory studies and re-evaluation of patient's condition   I assumed direction of critical care for this patient from another  provider in my specialty: no

## 2018-12-30 NOTE — Progress Notes (Signed)
Patient ID: Summer Bradley, female   DOB: 08/16/1945, 73 y.o.   MRN: [id number] [facility] PROGRESS NOTE  Lakelynn Rasor [id number] DOB: [birth year] DOA: 12/23/2018 PCP: [facility]  HPI/Subjective: Patient was awakened from sleep.  She was placed back on Precedex drip when I saw her.  States her breathing is better.  As per nursing staff was agitated overnight and heart rate went up into the 160s.  Objective: Vitals:   12/30/18 1500 12/30/18 1600  BP: 113/78 (!) 142/102  Pulse: 78 73  Resp: (!) 24 (!) 23  Temp: (!) 94.5 F (34.7 C) 97.8 F (36.6 C)  SpO2: 96% 96%    Filed Weights   12/27/18 0606 12/28/18 0302 12/28/18 1819  Weight: 96.3 kg 94.8 kg 95.4 kg    ROS: Review of Systems  Unable to perform ROS: Acuity of condition   Exam: Physical Exam  Constitutional: She appears lethargic.  HENT:  Nose: No mucosal edema.  Mouth/Throat: No oropharyngeal exudate or posterior oropharyngeal edema.  Eyes: Pupils are equal, round, and reactive to light. Conjunctivae, EOM and lids are normal.  Neck: No JVD present. Carotid bruit is not present. No edema present. No thyroid mass and no thyromegaly present.  Cardiovascular: S1 normal and S2 normal. An irregularly irregular rhythm present. Exam reveals no gallop.  No murmur heard. Pulses:      Dorsalis pedis pulses are 2+ on the right side and 2+ on the left side.  Respiratory: No respiratory distress. She has decreased breath sounds in the right lower field and the left lower field. She has no wheezes. She has no rhonchi. She has no rales.  GI: Soft. Bowel sounds are normal. There is no abdominal tenderness.  Musculoskeletal:     Right ankle: She exhibits swelling.     Left ankle: She exhibits swelling.  Lymphadenopathy:    She has no cervical adenopathy.  Neurological: She appears lethargic.  Able to sit up for me so I can listen to her lungs.  Skin: Skin is warm. Nails show no clubbing.   Bruising bilateral arms and legs.  Psychiatric:  Lethargic      Data Reviewed: Basic Metabolic Panel: Recent Labs  Lab 12/26/18 0439 12/27/18 0506 12/28/18 0607 12/29/18 0520 12/30/18 0450  NA 146* 143 144 145 148*  K 4.1 4.6 4.2 3.4* 3.8  CL 105 106 106 104 109  CO2 30 25 26 30 29   GLUCOSE 132* 206* 189* 221* 123*  BUN 33* 37* 42* 44* 42*  CREATININE 1.25* 0.99 1.01* 1.07* 0.94  CALCIUM 9.3 9.6 9.9 9.2 9.0  MG  --   --   --  2.5* 2.8*  PHOS  --   --   --  3.8  --     CBC: Recent Labs  Lab 12/26/18 0439 12/27/18 0506 12/28/18 0607 12/29/18 0520 12/30/18 0450  WBC 19.5* 13.6* 19.2* 13.6* 17.6*  NEUTROABS 16.7* 12.6* 17.5* 12.3*  --   HGB 14.6 15.0 15.2* 14.7 14.8  HCT 45.4 47.7* 45.6 45.2 45.1  MCV 96.4 97.5 93.1 94.0 94.4  PLT 296 289 311 253 253   BNP (last 3 results) Recent Labs    02/03/18 0611 12/23/18 1340 12/29/18 0521  BNP 183.0* 269.0* 518.0*     CBG: Recent Labs  Lab 12/29/18 2332 12/30/18 0412 12/30/18 0723 12/30/18 1143 12/30/18 1619  GLUCAP 148* 110* 130* 105* 115*    Recent Results (from the past 240 hour(s))  SARS CORONAVIRUS 2 (TAT 6-24  HRS) Nasopharyngeal Nasopharyngeal Swab     Status: None   Collection Time: 12/23/18 10:30 PM   Specimen: Nasopharyngeal Swab  Result Value Ref Range Status   SARS Coronavirus 2 NEGATIVE NEGATIVE Final    Comment: (NOTE) SARS-CoV-2 target nucleic acids are NOT DETECTED. The SARS-CoV-2 RNA is generally detectable in upper and lower respiratory specimens during the acute phase of infection. Negative results do not preclude SARS-CoV-2 infection, do not rule out co-infections with other pathogens, and should not be used as the sole basis for treatment or other patient management decisions. Negative results must be combined with clinical observations, patient history, and epidemiological information. The expected result is Negative. Fact Sheet for  Patients: SugarRoll.be Fact Sheet for Healthcare Providers: https://www.woods-mathews.com/ This test is not yet approved or cleared by the [place] FDA and  has been authorized for detection and/or diagnosis of SARS-CoV-2 by FDA under an Emergency Use Authorization (EUA). This EUA will remain  in effect (meaning this test can be used) for the duration of the COVID-19 declaration under Section 56 4(b)(1) of the Act, 21 U.S.C. section 360bbb-3(b)(1), unless the authorization is terminated or revoked sooner. Performed at [facility], [place] [address], [place], [place] [address]   Culture, blood (routine x 2) Call MD if unable to obtain prior to antibiotics being given     Status: Abnormal   Collection Time: 12/24/18  1:42 AM   Specimen: BLOOD  Result Value Ref Range Status   Specimen Description   Final    BLOOD RIGHT FOREARM Performed at [facility], [address] [place], [place] [address]    Special Requests   Final    BOTTLES DRAWN AEROBIC AND ANAEROBIC Blood Culture adequate volume Performed at [facility], [address] [place], [place] [address]    Culture  Setup Time   Final    AEROBIC BOTTLE ONLY GRAM POSITIVE COCCI CRITICAL RESULT CALLED TO, READ BACK BY AND VERIFIED WITH: ROBBINS,J AT 2046 ON 12/24/2018 BY MOSLEY,J GRAM POSITIVE RODS ANAEROBIC BOTTLE ONLY CRITICAL RESULT CALLED TO, READ BACK BY AND VERIFIED WITH: DAVID BESANTI AT [place] ON 12/25/18 RWW    Culture (A)  Final    STAPHYLOCOCCUS SPECIES (COAGULASE NEGATIVE) THE SIGNIFICANCE OF ISOLATING THIS ORGANISM FROM A SINGLE SET OF BLOOD CULTURES WHEN MULTIPLE SETS ARE DRAWN IS UNCERTAIN. PLEASE NOTIFY THE MICROBIOLOGY DEPARTMENT WITHIN ONE WEEK IF SPECIATION AND SENSITIVITIES ARE REQUIRED. DIPHTHEROIDS(CORYNEBACTERIUM SPECIES) Standardized susceptibility testing for this organism is not available. Performed at [facility], [place]  [address], [place], [place] [address]    Report Status 12/26/2018 FINAL  Final  Blood Culture ID Panel (Reflexed)     Status: Abnormal   Collection Time: 12/24/18  1:42 AM  Result Value Ref Range Status   Enterococcus species NOT DETECTED NOT DETECTED Final   Listeria monocytogenes NOT DETECTED NOT DETECTED Final   Staphylococcus species DETECTED (A) NOT DETECTED Final    Comment: Methicillin (oxacillin) resistant coagulase negative staphylococcus. Possible blood culture contaminant (unless isolated from more than one blood culture draw or clinical case suggests pathogenicity). No antibiotic treatment is indicated for blood  culture contaminants. CRITICAL RESULT CALLED TO, READ BACK BY AND VERIFIED WITH: ROBBINS,J AT 2046 ON 12/24/2018 BY MOSLEY,J    Staphylococcus aureus (BCID) NOT DETECTED NOT DETECTED Final   Methicillin resistance DETECTED (A) NOT DETECTED Final    Comment: CRITICAL RESULT CALLED TO, READ BACK BY AND VERIFIED WITH: ROBBINS,J AT 2046 ON 12/24/2018 BY MOSLEY,J  Streptococcus species NOT DETECTED NOT DETECTED Final   Streptococcus agalactiae NOT DETECTED NOT DETECTED Final   Streptococcus pneumoniae NOT DETECTED NOT DETECTED Final   Streptococcus pyogenes NOT DETECTED NOT DETECTED Final   Acinetobacter baumannii NOT DETECTED NOT DETECTED Final   Enterobacteriaceae species NOT DETECTED NOT DETECTED Final   Enterobacter cloacae complex NOT DETECTED NOT DETECTED Final   Escherichia coli NOT DETECTED NOT DETECTED Final   Klebsiella oxytoca NOT DETECTED NOT DETECTED Final   Klebsiella pneumoniae NOT DETECTED NOT DETECTED Final   Proteus species NOT DETECTED NOT DETECTED Final   Serratia marcescens NOT DETECTED NOT DETECTED Final   Haemophilus influenzae NOT DETECTED NOT DETECTED Final   Neisseria meningitidis NOT DETECTED NOT DETECTED Final   Pseudomonas aeruginosa NOT DETECTED NOT DETECTED Final   Candida albicans NOT DETECTED NOT DETECTED Final   Candida glabrata NOT  DETECTED NOT DETECTED Final   Candida krusei NOT DETECTED NOT DETECTED Final   Candida parapsilosis NOT DETECTED NOT DETECTED Final   Candida tropicalis NOT DETECTED NOT DETECTED Final    Comment: Performed at [facility], [address] [place], [place] [address]  Culture, blood (routine x 2)     Status: None   Collection Time: 12/25/18  9:25 AM   Specimen: BLOOD  Result Value Ref Range Status   Specimen Description BLOOD RIGHT ANTECUBITAL  Final   Special Requests   Final    BOTTLES DRAWN AEROBIC AND ANAEROBIC Blood Culture results may not be optimal due to an inadequate volume of blood received in culture bottles   Culture   Final    NO GROWTH 5 DAYS Performed at [facility], [address] [place], [place] [address]    Report Status 12/30/2018 FINAL  Final  Culture, blood (routine x 2)     Status: None   Collection Time: 12/25/18  9:34 AM   Specimen: BLOOD  Result Value Ref Range Status   Specimen Description BLOOD BLOOD RIGHT FOREARM  Final   Special Requests   Final    BOTTLES DRAWN AEROBIC AND ANAEROBIC Blood Culture adequate volume   Culture   Final    NO GROWTH 5 DAYS Performed at [facility], [address] [place], [place] [address]    Report Status 12/30/2018 FINAL  Final  MRSA PCR Screening     Status: None   Collection Time: 12/28/18  6:26 PM   Specimen: Nasal Mucosa; Nasopharyngeal  Result Value Ref Range Status   MRSA by PCR NEGATIVE NEGATIVE Final    Comment:        The GeneXpert MRSA Assay (FDA approved for NASAL specimens only), is one component of a comprehensive MRSA colonization surveillance program. It is not intended to diagnose MRSA infection nor to guide or monitor treatment for MRSA infections. Performed at [facility], [address] [place], [place] [address]       Scheduled Meds: . bisoprolol  5 mg Oral BID  . budesonide (PULMICORT) nebulizer solution  0.5 mg Nebulization BID  .  calcium-vitamin D  1 tablet Oral BID  . Chlorhexidine Gluconate Cloth  6 each Topical Daily  . citalopram  20 mg Oral QHS  . cloNIDine  0.3 mg Transdermal Weekly  . guaiFENesin  600 mg Oral BID  . insulin aspart  0-20 Units Subcutaneous Q4H  . ipratropium-albuterol  3 mL Nebulization Q6H  . levothyroxine  150 mcg Oral QAC breakfast  . LORazepam  0.5 mg Intravenous TID  . mouth  rinse  15 mL Mouth Rinse BID  . nystatin  5 mL Oral QID  . pravastatin  20 mg Oral q1800  . QUEtiapine  25 mg Oral QHS  . sodium chloride flush  3 mL Intravenous Q12H   Continuous Infusions: . sodium chloride Stopped (12/27/18 2247)  . dexmedetomidine (PRECEDEX) IV infusion 0.8 mcg/kg/hr (12/30/18 1644)  . diltiazem (CARDIZEM) infusion 5 mg/hr (12/30/18 1600)  . famotidine (PEPCID) IV Stopped (12/30/18 1307)  . heparin 600 Units/hr (12/30/18 1600)    Assessment/Plan:  1. Acute on chronic hypoxic respiratory failure.  Last chest x-ray negative for pneumonia or fluid.  Patient was transferred over to the ICU initially for BiPAP but now currently on nasal cannula. 2. Acute delirium with underlying dementia.  The patient needed to be placed back on Precedex drip.  Continue Seroquel 25 mg nightly.  Patient does take BuSpar Celexa and as needed oral Ativan as outpatient.  Not sure if she will be able to take oral meds today.  I told the daughter that everything will depend on her mental status. 3. COPD exacerbation.  Continue nebulizers and inhalers.  Patient on doxycycline. 4. Paroxysmal atrial fibrillation with rapid ventricular response.  Patient on Cardizem drip for rate control.  Patient on heparin drip for anticoagulation. 5. Hypertension on Cardizem drip.  Unsure if she will be able to take her oral meds 6. Hypothyroidism unspecified on Synthroid 7. History of thrombotic ischemic stroke on heparin drip now. 8. Anxiety and depression on Celexa  Code Status:     Code Status Orders  (From admission, onward)          Start     Ordered   12/23/18 2226  Full code  Continuous     12/23/18 2233        Code Status History    Date Active Date Inactive Code Status Order ID Comments User Context   02/03/2018 0934 02/10/2018 1954 Full Code [id number]  Arta Silence, MD Inpatient   02/02/2014 0933 03/05/2014 1624 Full Code [id number]  Wilhelmina Mcardle, MD Inpatient   02/01/2014 1236 02/02/2014 0933 Full Code [id number]  Rob Hickman, MD Inpatient   01/29/2014 1119 02/01/2014 1236 Full Code [id number]  Roland Rack, MD Inpatient   Advance Care Planning Activity    Advance Directive Documentation     Most Recent Value  Type of Advance Directive  Healthcare Power of Attorney  Pre-existing out of facility DNR order (yellow form or pink MOST form)  -  "MOST" Form in Place?  -     Family Communication: Spoke with daughter at the bedside Disposition Plan: Unclear at this point  Time spent: 27 minutes.  Case discussed with nursing staff.  [place]  [facility] [employer]

## 2018-12-30 NOTE — Progress Notes (Addendum)
Pt able to take a few bites of dinner, fed by her daughter, but experienced deep, croupy cough with small amount water from spoon.  SLP consult placed at daughter's request

## 2018-12-30 NOTE — Progress Notes (Signed)
Progress Note  Patient Name: Summer Bradley Date of Encounter: 12/30/2018  Primary Cardiologist: New to [facility] - consult by End  Subjective   Delirium persists. Not eating. Agitated this morning. Back on Precedex. Remains in Afib with ventricular rates into the 130s overnight and this morning requiring IV pushes of metoprolol.   Inpatient Medications    Scheduled Meds: . bisoprolol  5 mg Oral BID  . budesonide (PULMICORT) nebulizer solution  0.5 mg Nebulization BID  . calcium-vitamin D  1 tablet Oral BID  . Chlorhexidine Gluconate Cloth  6 each Topical Daily  . citalopram  20 mg Oral QHS  . diltiazem  300 mg Oral Daily  . famotidine  20 mg Oral BID  . guaiFENesin  600 mg Oral BID  . insulin aspart  0-20 Units Subcutaneous Q4H  . ipratropium-albuterol  3 mL Nebulization Q6H  . levothyroxine  150 mcg Oral QAC breakfast  . LORazepam  0.5 mg Intravenous TID  . mouth rinse  15 mL Mouth Rinse BID  . nystatin  5 mL Oral QID  . pravastatin  20 mg Oral q1800  . QUEtiapine  25 mg Oral QHS  . sodium chloride flush  3 mL Intravenous Q12H   Continuous Infusions: . sodium chloride Stopped (12/27/18 2247)  . dexmedetomidine (PRECEDEX) IV infusion Stopped (12/29/18 1106)  . doxycycline (VIBRAMYCIN) IV 100 mg (12/29/18 2111)  . heparin 900 Units/hr (12/30/18 0300)   PRN Meds: sodium chloride, acetaminophen **OR** acetaminophen, guaiFENesin-codeine, hydrALAZINE, levalbuterol, magnesium hydroxide, metoprolol tartrate, morphine injection, polyvinyl alcohol, sodium chloride flush, traZODone   Vital Signs    Vitals:   12/30/18 0500 12/30/18 0600 12/30/18 0700 12/30/18 0725  BP:  (!) 151/104  (!) 162/113  Pulse: (!) 25 67 94   Resp: (!) 22 (!) 21 (!) 22   Temp: 98.1 F (36.7 C) 98.4 F (36.9 C) 98.6 F (37 C)   TempSrc:      SpO2: 98% 97% 98%   Weight:      Height:        Intake/Output Summary (Last 24 hours) at 12/30/2018 0739 Last data filed at 12/30/2018 0630 Gross per  24 hour  Intake 773.37 ml  Output 1765 ml  Net -991.63 ml   Filed Weights   12/27/18 0606 12/28/18 0302 12/28/18 1819  Weight: 96.3 kg 94.8 kg 95.4 kg    Telemetry    Afib with RVR with ventricular rates into the 110s to 130s bpm - Personally Reviewed  ECG    No new tracings - Personally Reviewed  Physical Exam   GEN: No acute distress. Intermittently agitated.  Neck: No JVD. Cardiac: Tachycardic, irregularly irregular, no murmurs, rubs, or gallops.  Respiratory: Diminished breath sounds bilateral bases.  GI: Soft, nontender, non-distended.   MS: Trace bilateral ankle edema; No deformity. Neuro:  Somnolent.  Psych: Somnolent, opens eyes when asked.  Labs    Chemistry Recent Labs  Lab 12/23/18 1645  12/28/18 0607 12/29/18 0520 12/30/18 0450  NA 142   < > 144 145 148*  K 3.7   < > 4.2 3.4* 3.8  CL 97*   < > 106 104 109  CO2 32   < > 26 30 29   GLUCOSE 97   < > 189* 221* 123*  BUN 13   < > 42* 44* 42*  CREATININE 1.01*   < > 1.01* 1.07* 0.94  CALCIUM 9.5   < > 9.9 9.2 9.0  PROT 7.4  --   --   --   --  ALBUMIN 3.9  --   --   --   --   AST 27  --   --   --   --   ALT 12  --   --   --   --   ALKPHOS 48  --   --   --   --   BILITOT 1.5*  --   --   --   --   GFRNONAA 55*   < > 55* 51* 60*  GFRAA >60   < > >60 59* >60  ANIONGAP 13   < > 12 11 10    < > = values in this interval not displayed.     Hematology Recent Labs  Lab 12/28/18 0607 12/29/18 0520 12/30/18 0450  WBC 19.2* 13.6* 17.6*  RBC 4.90 4.81 4.78  HGB 15.2* 14.7 14.8  HCT 45.6 45.2 45.1  MCV 93.1 94.0 94.4  MCH 31.0 30.6 31.0  MCHC 33.3 32.5 32.8  RDW 14.7 14.6 14.8  PLT 311 253 253    Cardiac EnzymesNo results for input(s): TROPONINI in the last 168 hours. No results for input(s): TROPIPOC in the last 168 hours.   BNP Recent Labs  Lab 12/23/18 1340 12/29/18 0521  BNP 269.0* 518.0*     DDimer No results for input(s): DDIMER in the last 168 hours.   Radiology    Dg Chest Port 1  View  Result Date: 12/28/2018 IMPRESSION: Stable mild cardiomegaly without overt pulmonary edema. No acute pulmonary disease. Emphysema. Electronically Signed   By: Ilona Sorrel M.D.   On: 12/28/2018 17:32    Cardiac Studies   2D echo 12/25/2018: 1. Left ventricular ejection fraction, by visual estimation, is 55 to 60%. The left ventricle has normal function. There is no left ventricular hypertrophy.  2. Global right ventricle has normal systolic function.The right ventricular size is mildly enlarged. No increase in right ventricular wall thickness.  3. Left atrial size was mildly dilated.  4. Tricuspid valve regurgitation mild-moderate.  5. Moderately elevated pulmonary artery systolic pressure.  6. The tricuspid regurgitant velocity is 2.88 m/s, and with an assumed right atrial pressure of 10 mmHg, the estimated right ventricular systolic pressure is moderately elevated at 43.1 mmHg.  7. Rhythm is atrial fibrillation  8. Challenging image quality  Patient Profile     73 y.o. female with history of PAF, chronic hypoxic respiratory failure on supplemental oxygen at home, COPD, and HTN who we are seeing for Afib.   Assessment & Plan    1. PAF with RVR: -Ventricular rates have been tachycardic over the past 12-16 hours in the setting of her not taking pills at this time secondary to delirium as well as agitation -Received multiple IV pushes of metoprolol, initially with some improvement, though this morning she did not have great response to IV metoprolol in the setting of agitation -Back on Precedex with some improvement in ventricular rates -Start diltiazem gtt for rate control -Now on heparin gtt in place of Eliquis secondary to delirium -Not currently a candidate for TEE-guided DCCV secondary to the above -Secondly, in her acute illness, she would be unlikely to hold sinus rhythm at this time -Likely in the setting of acute illness and agitation  -Once her acute illness is improved,  if she remains in Afib, TEE-guided DCCV can be considered pending GOC moving forward   2. Acute on chronic hypoxic respiratory distress: -Secondary to AECOPD -Per IM  3. Delirium with underlying dementia/agitation: -Transferred to the ICU on  11/11 -Transferred to heparin gtt 11/12 in the setting of increased somnolence   -Cannot exclude withdrawing from benzo -Has previously required tach and G-tube for similar presentation -No improvement in symptoms despite BiPAP and adequate blood gases   For questions or updates, please contact [facility] Please consult [email or web address] for contact info under Cardiology/STEMI.    Signed, Christell Faith, PA-C [place] Pager: [phone] 12/30/2018, 7:39 AM

## 2018-12-30 NOTE — Consult Note (Signed)
ANTICOAGULATION CONSULT NOTE  Pharmacy Consult for heparin infusion Indication: atrial fibrillation  Patient Measurements: Height: 5\' 3"  (160 cm) Weight: 210 lb 5.1 oz (95.4 kg) IBW/kg (Calculated) : 52.4 Heparin Dosing Weight: 74.5 kg  Vital Signs: Temp: 97.7 F (36.5 C) (11/13 0025) Temp Source: Rectal (11/12 1600) BP: 143/87 (11/13 0025) Pulse Rate: 73 (11/13 0025)  Labs: Recent Labs    12/27/18 0506 12/28/18 0607 12/29/18 0520 12/29/18 1511 12/29/18 2344  HGB 15.0 15.2* 14.7  --   --   HCT 47.7* 45.6 45.2  --   --   PLT 289 311 253  --   --   APTT  --   --   --  29 >160*  LABPROT  --   --   --  16.4*  --   INR  --   --   --  1.3*  --   HEPARINUNFRC  --   --   --  2.54* 1.54*  CREATININE 0.99 1.01* 1.07*  --   --     Estimated Creatinine Clearance: 50.7 mL/min (A) (by C-G formula based on SCr of 1.07 mg/dL (H)).   Medical History: Past Medical History:  Diagnosis Date  . A-fib ([place])   . Anxiety   . COPD (chronic obstructive pulmonary disease) ([place])   . Cough    CHONIC  . Depression   . Dysrhythmia   . GERD (gastroesophageal reflux disease)   . Heart disease   . High cholesterol   . Hypertension   . Hypothyroidism   . Orthopnea   . Oxygen dependent    2/L CONTINUOUSLY  . Stroke ([place])   . Vertigo   . Wheezing     Medications:  Scheduled:  . bisoprolol  5 mg Oral BID  . budesonide (PULMICORT) nebulizer solution  0.5 mg Nebulization BID  . calcium-vitamin D  1 tablet Oral BID  . Chlorhexidine Gluconate Cloth  6 each Topical Daily  . citalopram  20 mg Oral QHS  . diltiazem  300 mg Oral Daily  . famotidine  20 mg Oral BID  . guaiFENesin  600 mg Oral BID  . insulin aspart  0-20 Units Subcutaneous Q4H  . ipratropium-albuterol  3 mL Nebulization Q6H  . levothyroxine  150 mcg Oral QAC breakfast  . LORazepam  0.5 mg Intravenous TID  . mouth rinse  15 mL Mouth Rinse BID  . nystatin  5 mL Oral QID  . pravastatin  20 mg Oral q1800  . QUEtiapine  25  mg Oral QHS  . sodium chloride flush  3 mL Intravenous Q12H    Assessment: 73 y.o. female  admitted with acute on chronic hypoxic and hypercarbic respiratory failure secondary to AECOPD requiring BiPAP  Chest x-ray negative for pneumonia or fluid. Transferred to ICU stepdown for BiPAP 11/11. Prior to this note she has been on apixaban. However, she is unable to swallow oral medications. Her last dose of apixaban was 12/28/18 at 0804 am  11/12 @ 2344 aPTT > 160, HL 1.54, SUPRAtherapeutic, will hold heparin x 1 hour then reduce rate to 900 units/hr, recheck aPTT 6 hours after heparin resumed  Goal of Therapy:  Heparin level 0.3-0.7 units/ml aPTT 66 - 102s Monitor platelets by anticoagulation protocol: Yes   Plan:   Hold heparin x 1 hr then reduce heparin infusion to 900 units/hr  Check aPTT 6 hours after heparin restarted.   aPTT will be used to guide therapy until aPTT and anti-Xa correlate  Continue to  monitor H&H and platelets  Ena Dawley, PharmD 12/30/2018,2:00 AM

## 2018-12-30 NOTE — Progress Notes (Signed)
Ch f/u with pt and dau at bedside. Pt was yelling out and presented to be in distress. Pt is a 73 y.o. female made DNR with a hx of emphysema, COPD, anxiety, and O2 dependent. Pt was alert and able to express her needs when ch arrive at bedside w/ dau. Pt is asking for her children. Pt's dau is face timing family to keep pt calm and less anxious. Ch provided a compassionate presence with pt and dau, prayed for pt, and address pt's comfort needs. Ch visit was appreciated.    12/30/18 1000  Clinical Encounter Type  Visited With Patient and family together;Health care provider  Visit Type Follow-up;Psychological support;Spiritual support;Social support;Critical Care  Spiritual Encounters  Spiritual Needs Emotional;Grief support;Prayer  Stress Factors  Patient Stress Factors Health changes;Loss of control;Major life changes  Family Stress Factors Family relationships;Major life changes

## 2018-12-31 ENCOUNTER — Inpatient Hospital Stay: Payer: Medicare (Managed Care)

## 2018-12-31 LAB — GLUCOSE, CAPILLARY
Glucose-Capillary: 130 mg/dL — ABNORMAL HIGH (ref 70–99)
Glucose-Capillary: 104 mg/dL — ABNORMAL HIGH (ref 70–99)
Glucose-Capillary: 137 mg/dL — ABNORMAL HIGH (ref 70–99)
Glucose-Capillary: 101 mg/dL — ABNORMAL HIGH (ref 70–99)
Glucose-Capillary: 102 mg/dL — ABNORMAL HIGH (ref 70–99)

## 2018-12-31 LAB — RENAL FUNCTION PANEL
Albumin: 3.3 g/dL — ABNORMAL LOW (ref 3.5–5.0)
Anion gap: 10 (ref 5–15)
BUN: 40 mg/dL — ABNORMAL HIGH (ref 8–23)
CO2: 26 mmol/L (ref 22–32)
Calcium: 8.8 mg/dL — ABNORMAL LOW (ref 8.9–10.3)
Chloride: 115 mmol/L — ABNORMAL HIGH (ref 98–111)
Creatinine, Ser: 0.81 mg/dL (ref 0.44–1.00)
GFR calc Af Amer: >60 mL/min (ref >60)
GFR calc non Af Amer: >60 mL/min (ref >60)
Glucose, Bld: 110 mg/dL — ABNORMAL HIGH (ref 70–99)
Phosphorus: 2.9 mg/dL (ref 2.5–4.6)
Potassium: 3.8 mmol/L (ref 3.5–5.1)
Sodium: 151 mmol/L — ABNORMAL HIGH (ref 135–145)

## 2018-12-31 LAB — CBC
HCT: 48.2 % — ABNORMAL HIGH (ref 36.0–46.0)
Hemoglobin: 15.4 g/dL — ABNORMAL HIGH (ref 12.0–15.0)
MCH: 30.7 pg (ref 26.0–34.0)
MCHC: 32 g/dL (ref 30.0–36.0)
MCV: 96 fL (ref 80.0–100.0)
Platelets: 224 10*3/uL (ref 150–400)
RBC: 5.02 MIL/uL (ref 3.87–5.11)
RDW: 14.8 % (ref 11.5–15.5)
WBC: 12.6 10*3/uL — ABNORMAL HIGH (ref 4.0–10.5)
nRBC: 0.3 % — ABNORMAL HIGH (ref 0.0–0.2)

## 2018-12-31 LAB — HEPARIN LEVEL (UNFRACTIONATED): Heparin Unfractionated: 1.05 [IU]/mL — ABNORMAL HIGH (ref 0.30–0.70)

## 2018-12-31 LAB — APTT
aPTT: 65 s — ABNORMAL HIGH (ref 24–36)
aPTT: 61 s — ABNORMAL HIGH (ref 24–36)

## 2018-12-31 LAB — MAGNESIUM: Magnesium: 2.9 mg/dL — ABNORMAL HIGH (ref 1.7–2.4)

## 2018-12-31 MED ORDER — FUROSEMIDE 10 MG/ML IJ SOLN
40.0000 mg | Freq: Every day | INTRAMUSCULAR | Status: DC
  Administered 2018-12-31 – 2019-01-03 (×4): 40 mg via INTRAVENOUS
  Filled 2018-12-31 (×4): qty 4

## 2018-12-31 MED ORDER — LEVOTHYROXINE SODIUM 50 MCG PO TABS
150.0000 ug | ORAL_TABLET | Freq: Every day | ORAL | Status: DC
  Administered 2019-01-01 – 2019-01-03 (×3): 150 ug via ORAL
  Filled 2018-12-31 (×3): qty 1

## 2018-12-31 MED ORDER — BUSPIRONE HCL 10 MG PO TABS
10.0000 mg | ORAL_TABLET | Freq: Two times a day (BID) | ORAL | Status: DC
  Administered 2018-12-31 – 2019-01-01 (×3): 10 mg via ORAL
  Filled 2018-12-31 (×4): qty 1

## 2018-12-31 NOTE — Progress Notes (Signed)
Patient was taken off bipap couple hours ago due to hollering and trying to pull mask off. Patient is still hollering out now despite having received night time meds from RN. Per NP-will leave patient off bipap for now as long as she is stable since she is still hollering. Left on standby for now.
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
## 2018-12-31 NOTE — Progress Notes (Signed)
Patient ID: Summer Bradley, female   DOB: [birth year], 73 y.o.   MRN: [id number] [facility] PROGRESS NOTE  Taiana Rexach [id number] DOB: [birth year] DOA: 12/23/2018 PCP: [place]  HPI/Subjective: Patient was on Precedex drip when I saw her.  We took her off Precedex drip and started to answer only a few questions.  Still pretty lethargic.  Was more awake this morning.  In speaking with the nurse now she had to put the patient back on Precedex drip.  Objective: Vitals:   12/31/18 0737 12/31/18 0800  BP: (!) 146/100   Pulse:    Resp:    Temp:  (!) 97.5 F (36.4 C)  SpO2:      Filed Weights   12/27/18 0606 12/28/18 0302 12/28/18 1819  Weight: 96.3 kg 94.8 kg 95.4 kg    ROS: Review of Systems  Unable to perform ROS: Acuity of condition   Exam: Physical Exam  Constitutional: She appears lethargic.  HENT:  Nose: No mucosal edema.  Mouth/Throat: No oropharyngeal exudate or posterior oropharyngeal edema.  Eyes: Pupils are equal, round, and reactive to light. Conjunctivae, EOM and lids are normal.  Neck: No JVD present. Carotid bruit is not present. No edema present. No thyroid mass and no thyromegaly present.  Cardiovascular: S1 normal and S2 normal. An irregularly irregular rhythm present. Exam reveals no gallop.  No murmur heard. Pulses:      Dorsalis pedis pulses are 2+ on the right side and 2+ on the left side.  Respiratory: No respiratory distress. She has decreased breath sounds in the right lower field and the left lower field. She has no wheezes. She has no rhonchi. She has no rales.  GI: Soft. Bowel sounds are normal. There is no abdominal tenderness.  Musculoskeletal:     Right ankle: She exhibits swelling.     Left ankle: She exhibits swelling.  Lymphadenopathy:    She has no cervical adenopathy.  Neurological: She appears lethargic.  Able to sit up for me so I can listen to her lungs.  Skin: Skin is warm. Nails show no  clubbing.  Bruising bilateral arms and legs.  Psychiatric:  Lethargic      Data Reviewed: Basic Metabolic Panel: Recent Labs  Lab 12/27/18 0506 12/28/18 0607 12/29/18 0520 12/30/18 0450 12/31/18 0652 12/31/18 0653  NA 143 144 145 148*  --  151*  K 4.6 4.2 3.4* 3.8  --  3.8  CL 106 106 104 109  --  115*  CO2 25 26 30 29   --  26  GLUCOSE 206* 189* 221* 123*  --  110*  BUN 37* 42* 44* 42*  --  40*  CREATININE 0.99 1.01* 1.07* 0.94  --  0.81  CALCIUM 9.6 9.9 9.2 9.0  --  8.8*  MG  --   --  2.5* 2.8* 2.9*  --   PHOS  --   --  3.8  --   --  2.9    CBC: Recent Labs  Lab 12/26/18 0439 12/27/18 0506 12/28/18 0607 12/29/18 0520 12/30/18 0450 12/31/18 0652  WBC 19.5* 13.6* 19.2* 13.6* 17.6* 12.6*  NEUTROABS 16.7* 12.6* 17.5* 12.3*  --   --   HGB 14.6 15.0 15.2* 14.7 14.8 15.4*  HCT 45.4 47.7* 45.6 45.2 45.1 48.2*  MCV 96.4 97.5 93.1 94.0 94.4 96.0  PLT 296 289 311 253 253 224   BNP (last 3 results) Recent Labs    02/03/18 0611 12/23/18 1340 12/29/18 0521  BNP  183.0* 269.0* 518.0*     CBG: Recent Labs  Lab 12/30/18 1957 12/30/18 2306 12/31/18 0319 12/31/18 0752 12/31/18 1124  GLUCAP 116* 125* 130* 104* 137*    Recent Results (from the past 240 hour(s))  SARS CORONAVIRUS 2 (TAT 6-24 HRS) Nasopharyngeal Nasopharyngeal Swab     Status: None   Collection Time: 12/23/18 10:30 PM   Specimen: Nasopharyngeal Swab  Result Value Ref Range Status   SARS Coronavirus 2 NEGATIVE NEGATIVE Final    Comment: (NOTE) SARS-CoV-2 target nucleic acids are NOT DETECTED. The SARS-CoV-2 RNA is generally detectable in upper and lower respiratory specimens during the acute phase of infection. Negative results do not preclude SARS-CoV-2 infection, do not rule out co-infections with other pathogens, and should not be used as the sole basis for treatment or other patient management decisions. Negative results must be combined with clinical observations, patient history, and  epidemiological information. The expected result is Negative. Fact Sheet for Patients: SugarRoll.be Fact Sheet for Healthcare Providers: https://www.woods-mathews.com/ This test is not yet approved or cleared by the [place] FDA and  has been authorized for detection and/or diagnosis of SARS-CoV-2 by FDA under an Emergency Use Authorization (EUA). This EUA will remain  in effect (meaning this test can be used) for the duration of the COVID-19 declaration under Section 56 4(b)(1) of the Act, 21 U.S.C. section 360bbb-3(b)(1), unless the authorization is terminated or revoked sooner. Performed at [facility], [place] [address] [place], [place] [address]   Culture, blood (routine x 2) Call MD if unable to obtain prior to antibiotics being given     Status: Abnormal   Collection Time: 12/24/18  1:42 AM   Specimen: BLOOD  Result Value Ref Range Status   Specimen Description   Final    BLOOD RIGHT FOREARM Performed at [facility], [address] [place], [place] [address]    Special Requests   Final    BOTTLES DRAWN AEROBIC AND ANAEROBIC Blood Culture adequate volume Performed at [facility], [address] [place], [place] [address]    Culture  Setup Time   Final    AEROBIC BOTTLE ONLY GRAM POSITIVE COCCI CRITICAL RESULT CALLED TO, READ BACK BY AND VERIFIED WITH: ROBBINS,J AT 2046 ON 12/24/2018 BY MOSLEY,J GRAM POSITIVE RODS ANAEROBIC BOTTLE ONLY CRITICAL RESULT CALLED TO, READ BACK BY AND VERIFIED WITH: DAVID BESANTI AT [place] ON 12/25/18 RWW    Culture (A)  Final    STAPHYLOCOCCUS SPECIES (COAGULASE NEGATIVE) THE SIGNIFICANCE OF ISOLATING THIS ORGANISM FROM A SINGLE SET OF BLOOD CULTURES WHEN MULTIPLE SETS ARE DRAWN IS UNCERTAIN. PLEASE NOTIFY THE MICROBIOLOGY DEPARTMENT WITHIN ONE WEEK IF SPECIATION AND SENSITIVITIES ARE REQUIRED. DIPHTHEROIDS(CORYNEBACTERIUM SPECIES) Standardized susceptibility testing for  this organism is not available. Performed at [facility], [place] [address] [place],  [address]    Report Status 12/26/2018 FINAL  Final  Blood Culture ID Panel (Reflexed)     Status: Abnormal   Collection Time: 12/24/18  1:42 AM  Result Value Ref Range Status   Enterococcus species NOT DETECTED NOT DETECTED Final   Listeria monocytogenes NOT DETECTED NOT DETECTED Final   Staphylococcus species DETECTED (A) NOT DETECTED Final    Comment: Methicillin (oxacillin) resistant coagulase negative staphylococcus. Possible blood culture contaminant (unless isolated from more than one blood culture draw or clinical case suggests pathogenicity). No antibiotic treatment is indicated for blood  culture contaminants. CRITICAL RESULT CALLED TO, READ BACK BY AND VERIFIED WITH: ROBBINS,J AT 2046  ON 12/24/2018 BY MOSLEY,J    Staphylococcus aureus (BCID) NOT DETECTED NOT DETECTED Final   Methicillin resistance DETECTED (A) NOT DETECTED Final    Comment: CRITICAL RESULT CALLED TO, READ BACK BY AND VERIFIED WITH: ROBBINS,J AT 2046 ON 12/24/2018 BY MOSLEY,J    Streptococcus species NOT DETECTED NOT DETECTED Final   Streptococcus agalactiae NOT DETECTED NOT DETECTED Final   Streptococcus pneumoniae NOT DETECTED NOT DETECTED Final   Streptococcus pyogenes NOT DETECTED NOT DETECTED Final   Acinetobacter baumannii NOT DETECTED NOT DETECTED Final   Enterobacteriaceae species NOT DETECTED NOT DETECTED Final   Enterobacter cloacae complex NOT DETECTED NOT DETECTED Final   Escherichia coli NOT DETECTED NOT DETECTED Final   Klebsiella oxytoca NOT DETECTED NOT DETECTED Final   Klebsiella pneumoniae NOT DETECTED NOT DETECTED Final   Proteus species NOT DETECTED NOT DETECTED Final   Serratia marcescens NOT DETECTED NOT DETECTED Final   Haemophilus influenzae NOT DETECTED NOT DETECTED Final   Neisseria meningitidis NOT DETECTED NOT DETECTED Final   Pseudomonas aeruginosa NOT DETECTED NOT DETECTED Final    Candida albicans NOT DETECTED NOT DETECTED Final   Candida glabrata NOT DETECTED NOT DETECTED Final   Candida krusei NOT DETECTED NOT DETECTED Final   Candida parapsilosis NOT DETECTED NOT DETECTED Final   Candida tropicalis NOT DETECTED NOT DETECTED Final    Comment: Performed at [facility], [address] [place], [place] [address]  Culture, blood (routine x 2)     Status: None   Collection Time: 12/25/18  9:25 AM   Specimen: BLOOD  Result Value Ref Range Status   Specimen Description BLOOD RIGHT ANTECUBITAL  Final   Special Requests   Final    BOTTLES DRAWN AEROBIC AND ANAEROBIC Blood Culture results may not be optimal due to an inadequate volume of blood received in culture bottles   Culture   Final    NO GROWTH 5 DAYS Performed at [facility], [address] [place], [place] [address]    Report Status 12/30/2018 FINAL  Final  Culture, blood (routine x 2)     Status: None   Collection Time: 12/25/18  9:34 AM   Specimen: BLOOD  Result Value Ref Range Status   Specimen Description BLOOD BLOOD RIGHT FOREARM  Final   Special Requests   Final    BOTTLES DRAWN AEROBIC AND ANAEROBIC Blood Culture adequate volume   Culture   Final    NO GROWTH 5 DAYS Performed at [facility], [address] [place], [place] [address]    Report Status 12/30/2018 FINAL  Final  MRSA PCR Screening     Status: None   Collection Time: 12/28/18  6:26 PM   Specimen: Nasal Mucosa; Nasopharyngeal  Result Value Ref Range Status   MRSA by PCR NEGATIVE NEGATIVE Final    Comment:        The GeneXpert MRSA Assay (FDA approved for NASAL specimens only), is one component of a comprehensive MRSA colonization surveillance program. It is not intended to diagnose MRSA infection nor to guide or monitor treatment for MRSA infections. Performed at [facility], [address] [place], [place] [address]       Scheduled Meds: . bisoprolol  5 mg Oral BID  .  budesonide (PULMICORT) nebulizer solution  0.5 mg Nebulization BID  . calcium-vitamin D  1 tablet Oral BID  . Chlorhexidine Gluconate Cloth  6 each Topical Daily  . citalopram  20 mg Oral QHS  . cloNIDine  0.3  mg Transdermal Weekly  . furosemide  40 mg Intravenous Daily  . guaiFENesin  600 mg Oral BID  . insulin aspart  0-20 Units Subcutaneous Q4H  . ipratropium-albuterol  3 mL Nebulization Q6H  . levothyroxine  150 mcg Oral QAC breakfast  . LORazepam  0.5 mg Intravenous TID  . mouth rinse  15 mL Mouth Rinse BID  . nystatin  5 mL Oral QID  . pravastatin  20 mg Oral q1800  . QUEtiapine  25 mg Oral QHS  . sodium chloride flush  3 mL Intravenous Q12H   Continuous Infusions: . sodium chloride Stopped (12/27/18 2247)  . dexmedetomidine (PRECEDEX) IV infusion 0.8 mcg/kg/hr (12/31/18 0942)  . diltiazem (CARDIZEM) infusion 5 mg/hr (12/31/18 0200)  . famotidine (PEPCID) IV 20 mg (12/31/18 0956)  . heparin 700 Units/hr (12/31/18 0943)    Assessment/Plan:  1. Acute on chronic hypoxic respiratory failure.  Chest x-ray today was negative but critical care specialist did give a dose of Lasix in case fluid overload.  Patient was transferred over to the ICU initially for BiPAP but now currently on nasal cannula. 2. Acute delirium with underlying dementia.  The patient has severe anxiety and requires Precedex drip on and off.  Continue Seroquel 25 mg nightly.  Patient does take BuSpar Celexa and as needed oral Ativan as outpatient.  Can consider psychiatric consultation. 3. COPD exacerbation.  Continue nebulizers and inhalers.  Patient completed antibiotics. 4. Paroxysmal atrial fibrillation with rapid ventricular response.  Patient on Cardizem drip for rate control.  Patient on heparin drip for anticoagulation. 5. Hypertension on Cardizem drip.  Unsure if she will be able to take her oral meds 6. Hypothyroidism unspecified on Synthroid 7. History of thrombotic ischemic stroke on heparin drip  now. 8. Anxiety and depression on Celexa  Code Status:     Code Status Orders  (From admission, onward)         Start     Ordered   12/23/18 2226  Full code  Continuous     12/23/18 2233        Code Status History    Date Active Date Inactive Code Status Order ID Comments User Context   02/03/2018 0934 02/10/2018 1954 Full Code [id number]  Arta Silence, MD Inpatient   02/02/2014 0933 03/05/2014 1624 Full Code [id number]  Wilhelmina Mcardle, MD Inpatient   02/01/2014 1236 02/02/2014 0933 Full Code [id number]  Rob Hickman, MD Inpatient   01/29/2014 1119 02/01/2014 1236 Full Code [id number]  Roland Rack, MD Inpatient   Advance Care Planning Activity    Advance Directive Documentation     Most Recent Value  Type of Advance Directive  Healthcare Power of Attorney  Pre-existing out of facility DNR order (yellow form or pink MOST form)  -  "MOST" Form in Place?  -     Family Communication: Spoke with daughter at the bedside Disposition Plan: Unclear at this point  Time spent: 27 minutes.  Case discussed with nursing staff and critical care specialist.  Loletha Grayer  [facility]

## 2018-12-31 NOTE — Consult Note (Signed)
ANTICOAGULATION CONSULT NOTE  Pharmacy Consult for heparin infusion Indication: atrial fibrillation  Patient Measurements: Height: 5\' 3"  (160 cm) Weight: 210 lb 5.1 oz (95.4 kg) IBW/kg (Calculated) : 52.4 Heparin Dosing Weight: 74.5 kg  Vital Signs: Temp: 97.5 F (36.4 C) (11/14 0200) Temp Source: Axillary (11/14 0200) BP: 146/100 (11/14 0737) Pulse Rate: 67 (11/14 0600)  Labs: Recent Labs    12/29/18 0520  12/29/18 1511 12/29/18 2344 12/30/18 0450 12/30/18 1056 12/30/18 2209 12/31/18 0652 12/31/18 0653  HGB 14.7  --   --   --  14.8  --   --  15.4*  --   HCT 45.2  --   --   --  45.1  --   --  48.2*  --   PLT 253  --   --   --  253  --   --  224  --   APTT  --    < > 29 >160*  --  131* 75* 65*  --   LABPROT  --   --  16.4*  --   --   --   --   --   --   INR  --   --  1.3*  --   --   --   --   --   --   HEPARINUNFRC  --   --  2.54* 1.54*  --   --   --  1.05*  --   CREATININE 1.07*  --   --   --  0.94  --   --   --  0.81   < > = values in this interval not displayed.    Estimated Creatinine Clearance: 67 mL/min (by C-G formula based on SCr of 0.81 mg/dL).    Assessment: 73 y.o. female  admitted with acute on chronic hypoxic and hypercarbic respiratory failure secondary to AECOPD requiring BiPAP  Chest x-ray negative for pneumonia or fluid. Transferred to ICU stepdown for BiPAP 11/11. Prior to this note she has been on apixaban. However, she is unable to swallow oral medications. Her last dose of apixaban was 12/28/18 at 0804 am. H&H, PLT currently wnl  Heparin Course: 11/12 initiation: 4000 unit bolus, then 1100 units/hr 11/12 2344 aPTT > 160, HL 1.54 reduce rate to 900 units/hr 11/13 1056 aPTT 131s: reduce rate to 600 units/hr 11/13 2209 aPTT 75s: therapeutic, continue current rate and f/u aPTT in am to confirm 11/14 0652 aPTT 65, slightly subtherapeutic, HL 1.05, not yet correlating  Goal of Therapy:  Heparin level 0.3-0.7 units/ml aPTT 66 - 102s Monitor  platelets by anticoagulation protocol: Yes   Plan:   Increase heparin drip to 700 units/hr  Check aPTT in 8h  aPTT will be used to guide therapy until aPTT and anti-Xa correlate. Next heparin level in am  CBC in AM, continue to monitor H&H and platelets  Rocky Morel, PharmD, BCPS 12/31/2018,8:46 AM

## 2018-12-31 NOTE — Progress Notes (Addendum)
Progress Note  Patient Name: Summer Bradley Date of Encounter: 12/31/2018  Primary Cardiologist: New to [facility] - consult by End  Subjective   No acute overnight events. Remains in Afib with controlled ventricular response over the past 24 hours with diltiazem gtt. Still not taking PO medications. Labs pending this morning. Sleeping. Remains in Precedex secondary to delirium and agitation.   Inpatient Medications    Scheduled Meds: . bisoprolol  5 mg Oral BID  . budesonide (PULMICORT) nebulizer solution  0.5 mg Nebulization BID  . calcium-vitamin D  1 tablet Oral BID  . Chlorhexidine Gluconate Cloth  6 each Topical Daily  . citalopram  20 mg Oral QHS  . cloNIDine  0.3 mg Transdermal Weekly  . guaiFENesin  600 mg Oral BID  . insulin aspart  0-20 Units Subcutaneous Q4H  . ipratropium-albuterol  3 mL Nebulization Q6H  . levothyroxine  150 mcg Oral QAC breakfast  . LORazepam  0.5 mg Intravenous TID  . mouth rinse  15 mL Mouth Rinse BID  . nystatin  5 mL Oral QID  . pravastatin  20 mg Oral q1800  . QUEtiapine  25 mg Oral QHS  . sodium chloride flush  3 mL Intravenous Q12H   Continuous Infusions: . sodium chloride Stopped (12/27/18 2247)  . dexmedetomidine (PRECEDEX) IV infusion 0.8 mcg/kg/hr (12/31/18 0423)  . diltiazem (CARDIZEM) infusion 5 mg/hr (12/31/18 0200)  . famotidine (PEPCID) IV Stopped (12/30/18 2148)  . heparin 600 Units/hr (12/31/18 0200)   PRN Meds: sodium chloride, acetaminophen **OR** acetaminophen, guaiFENesin-codeine, hydrALAZINE, levalbuterol, magnesium hydroxide, metoprolol tartrate, morphine injection, polyvinyl alcohol, sodium chloride flush, traZODone   Vital Signs    Vitals:   12/31/18 0400 12/31/18 0500 12/31/18 0600 12/31/18 0737  BP: (!) 149/104 (!) 131/95 (!) 132/95 (!) 146/100  Pulse: 72 79 67   Resp: 18 17 20    Temp:      TempSrc:      SpO2: 99% 99% 99%   Weight:      Height:        Intake/Output Summary (Last 24 hours) at  12/31/2018 0740 Last data filed at 12/31/2018 0634 Gross per 24 hour  Intake 656.57 ml  Output 1112 ml  Net -455.43 ml   Filed Weights   12/27/18 0606 12/28/18 0302 12/28/18 1819  Weight: 96.3 kg 94.8 kg 95.4 kg    Telemetry    Afib with controlled ventricular response - Personally Reviewed  ECG    No new tracings - Personally Reviewed  Physical Exam   GEN: No acute distress. Intermittently agitated.  Neck: No JVD. Cardiac: Irregularly irregular, no murmurs, rubs, or gallops.  Respiratory: Diminished breath sounds bilateral bases.  GI: Soft, nontender, non-distended.   MS: Trace bilateral ankle edema; No deformity. Neuro:  Somnolent.  Psych: Somnolent, opens eyes when asked.  Labs    Chemistry Recent Labs  Lab 12/28/18 0607 12/29/18 0520 12/30/18 0450  NA 144 145 148*  K 4.2 3.4* 3.8  CL 106 104 109  CO2 26 30 29   GLUCOSE 189* 221* 123*  BUN 42* 44* 42*  CREATININE 1.01* 1.07* 0.94  CALCIUM 9.9 9.2 9.0  GFRNONAA 55* 51* 60*  GFRAA >60 59* >60  ANIONGAP 12 11 10      Hematology Recent Labs  Lab 12/28/18 0607 12/29/18 0520 12/30/18 0450  WBC 19.2* 13.6* 17.6*  RBC 4.90 4.81 4.78  HGB 15.2* 14.7 14.8  HCT 45.6 45.2 45.1  MCV 93.1 94.0 94.4  MCH 31.0 30.6  31.0  MCHC 33.3 32.5 32.8  RDW 14.7 14.6 14.8  PLT 311 253 253    Cardiac EnzymesNo results for input(s): TROPONINI in the last 168 hours. No results for input(s): TROPIPOC in the last 168 hours.   BNP Recent Labs  Lab 12/29/18 0521  BNP 518.0*     DDimer No results for input(s): DDIMER in the last 168 hours.   Radiology    Dg Chest Port 1 View  Result Date: 12/28/2018 IMPRESSION: Stable mild cardiomegaly without overt pulmonary edema. No acute pulmonary disease. Emphysema. Electronically Signed   By: Ilona Sorrel M.D.   On: 12/28/2018 17:32    Cardiac Studies   2D echo 12/25/2018: 1. Left ventricular ejection fraction, by visual estimation, is 55 to 60%. The left ventricle has  normal function. There is no left ventricular hypertrophy.  2. Global right ventricle has normal systolic function.The right ventricular size is mildly enlarged. No increase in right ventricular wall thickness.  3. Left atrial size was mildly dilated.  4. Tricuspid valve regurgitation mild-moderate.  5. Moderately elevated pulmonary artery systolic pressure.  6. The tricuspid regurgitant velocity is 2.88 m/s, and with an assumed right atrial pressure of 10 mmHg, the estimated right ventricular systolic pressure is moderately elevated at 43.1 mmHg.  7. Rhythm is atrial fibrillation  8. Challenging image quality  Patient Profile     73 y.o. female with history of PAF, chronic hypoxic respiratory failure on supplemental oxygen at home, COPD, and HTN who we are seeing for Afib.   Assessment & Plan    1. Persistent Afib: -Ventricular rates improved on diltiazem gtt, continue -With agitation, rates become tachycardic -Back on Precedex as of 11/13with some improvement in ventricular rates -Now on heparin gtt in place of Eliquis secondary to delirium -Not currently a candidate for TEE-guided DCCV secondary to the above -Secondly, in her acute illness, she would be unlikely to hold sinus rhythm at this time -Likely in the setting of acute illness and agitation  -Once her acute illness is improved, if she remains in Afib, TEE-guided DCCV can be considered pending GOC moving forward   2. Acute on chronic hypoxic respiratory distress: -Secondary to AECOPD -Per IM  3. Delirium with underlying dementia/agitation: -Transferred to the ICU on 11/11 -Transferred to heparin gtt 11/12 in the setting of increased somnolence   -Cannot exclude withdrawing from benzo -Has previously required tach and G-tube for similar presentation -No improvement in symptoms despite BiPAP and adequate blood gases   For questions or updates, please contact [facility] Please consult [email or web address] for contact info  under Cardiology/STEMI.    Signed, Christell Faith, PA-C [facility] Pager: [phone] 12/31/2018, 7:40 AM   I have seen and examined this patient with Christell Faith.  Agree with above, note added to reflect my findings.  On exam, irregular, reducedlung sounds at the bases. Remains in rate controlled AF. [place] Grape transition to PO when she wakes up. Continue current management.    Kedron Uno M. Accalia Rigdon MD 12/31/2018 12:14 PM

## 2018-12-31 NOTE — Consult Note (Signed)
ANTICOAGULATION CONSULT NOTE  Pharmacy Consult for heparin infusion Indication: atrial fibrillation  Patient Measurements: Height: 5\' 3"  (160 cm) Weight: 210 lb 5.1 oz (95.4 kg) IBW/kg (Calculated) : 52.4 Heparin Dosing Weight: 74.5 kg  Vital Signs: Temp: 97.5 F (36.4 C) (11/14 1400) Temp Source: Axillary (11/14 1400) BP: 119/66 (11/14 1700) Pulse Rate: 82 (11/14 1700)  Labs: Recent Labs    12/29/18 0520  12/29/18 1511 12/29/18 2344 12/30/18 0450  12/30/18 2209 12/31/18 0652 12/31/18 0653 12/31/18 1757  HGB 14.7  --   --   --  14.8  --   --  15.4*  --   --   HCT 45.2  --   --   --  45.1  --   --  48.2*  --   --   PLT 253  --   --   --  253  --   --  224  --   --   APTT  --    < > 29 >160*  --    < > 75* 65*  --  61*  LABPROT  --   --  16.4*  --   --   --   --   --   --   --   INR  --   --  1.3*  --   --   --   --   --   --   --   HEPARINUNFRC  --   --  2.54* 1.54*  --   --   --  1.05*  --   --   CREATININE 1.07*  --   --   --  0.94  --   --   --  0.81  --    < > = values in this interval not displayed.    Estimated Creatinine Clearance: 67 mL/min (by C-G formula based on SCr of 0.81 mg/dL).    Assessment: 73 y.o. female  admitted with acute on chronic hypoxic and hypercarbic respiratory failure secondary to AECOPD requiring BiPAP  Chest x-ray negative for pneumonia or fluid. Transferred to ICU stepdown for BiPAP 11/11. Prior to this note she has been on apixaban. However, she is unable to swallow oral medications. Her last dose of apixaban was 12/28/18 at 0804 am. H&H, PLT currently wnl  Heparin Course: 11/12 initiation: 4000 unit bolus, then 1100 units/hr 11/12 2344 aPTT > 160, HL 1.54 reduce rate to 900 units/hr 11/13 1056 aPTT 131s: reduce rate to 600 units/hr 11/13 2209 aPTT 75s: therapeutic, continue current rate and f/u aPTT in am to confirm 11/14 0652 aPTT 65, slightly subtherapeutic, HL 1.05, not yet correlating 11/14 1757 aPTT 61 @ 700 units/hr  Goal  of Therapy:  Heparin level 0.3-0.7 units/ml aPTT 66 - 102s Monitor platelets by anticoagulation protocol: Yes   Plan:   Increase heparin drip to 800 units/hr  Check aPTT in 8h  aPTT will be used to guide therapy until aPTT and anti-Xa correlate. Next heparin level in am  CBC in AM, continue to monitor H&H and platelets  Lu Duffel, PharmD, BCPS Clinical Pharmacist 12/31/2018 6:36 PM

## 2018-12-31 NOTE — Progress Notes (Signed)
Follow up - Critical Care Medicine Note  Patient Details:    Summer Bradley is an 73 y.o. female  admitted with acute on chronic hypoxic and hypercarbic respiratory failure secondary to AECOPD requiring BiPAP.  She has very severe COPD (stage IV) and has deteriorated due to delirium.     Events: 11/6: Pt admitted to the telemetry unit  11/6: CTA Chest revealed evaluation for pulmonary emboli is limited by motion artifact. Given this limitation, no PE was identified. Cardiomegaly with coronary artery disease. Moderate-sized hiatal hernia. Cholelithiasis without CT evidence for acute cholecystitis. Emphysema and aortic atherosclerosis.Aortic Atherosclerosis (ICD10-I70.0) and Emphysema (ICD10-J43.9). 11/7: CT Head/Cervical Spine revealed small left frontal scalp contusion. No calvarial fracture. No acute intracranial abnormality. No acute fracture or dislocation of cervical spine. Large chronic right MCA distribution infarction as well as small infarctions of right cerebellar hemisphere and left thalamus. Advanced cervical spondylosis with prominent left-sided facet arthropathy. 11/8: Echo revealed left ventricular ejection fraction, by visual estimation, is 55 to 60%. The left ventricle has normal function. There is no left ventricular hypertrophy. Global right ventricle has normal systolic function.The right ventricular size is mildly enlarged. No increase in right ventricular wall thickness. 11/11: Rapid response initiated due to pt developing worsening acute on chronic respiratory failure and encephalopathy requiring transfer to the stepdown unit for BiPAP 11/12: Lethargic today.  Required Precedex due to delirium, this is now off. 11/13 - on 0.8 of precedex for aggitation, heparin drip for AF, cardizem drip for accelerated htn.  Patient is DNR.  Palliative GOC done - family wants to keep trying for now 11/14-    Lines, Airways, Drains: Urethral Catheter Summer Bradley Latex 16 Fr.  (Active)  Indication for Insertion or Continuance of Catheter Acute urinary retention (I&O Cath for 24 hrs prior to catheter insertion- Inpatient Only) 12/29/18 1400  Site Assessment Clean;Intact 12/29/18 1400  Date Prophylactic Dressing Applied (if applicable) [id number]  Catheter Maintenance Bag below level of bladder;Catheter secured;Drainage bag/tubing not touching floor;Insertion date on drainage bag;No dependent loops;Seal intact 12/29/18 1400  Collection Container Standard drainage bag 12/29/18 1400  Securement Method Securing device (Describe) 12/29/18 1400  Output (mL) 125 mL 12/29/18 1200    Anti-infectives:  Anti-infectives (From admission, onward)   Start     Dose/Rate Route Frequency Ordered Stop   12/28/18 2045  doxycycline (VIBRAMYCIN) 100 mg in sodium chloride 0.9 % 250 mL IVPB  Status:  Discontinued     100 mg 125 mL/hr over 120 Minutes Intravenous Every 12 hours 12/28/18 2038 12/30/18 1018   12/26/18 2200  doxycycline (VIBRA-TABS) tablet 100 mg  Status:  Discontinued     100 mg Oral Every 12 hours 12/26/18 1655 12/28/18 2038   12/25/18 2200  vancomycin (VANCOCIN) IVPB 1000 mg/200 mL premix  Status:  Discontinued     1,000 mg 200 mL/hr over 60 Minutes Intravenous Every 24 hours 12/24/18 2125 12/25/18 1107   12/24/18 2130  vancomycin (VANCOCIN) 2,000 mg in sodium chloride 0.9 % 500 mL IVPB     2,000 mg 250 mL/hr over 120 Minutes Intravenous  Once 12/24/18 2118 12/25/18 0014   12/24/18 1000  azithromycin (ZITHROMAX) tablet 500 mg  Status:  Discontinued     500 mg Oral Daily 12/23/18 2233 12/24/18 0056   12/24/18 1000  doxycycline (VIBRA-TABS) tablet 100 mg  Status:  Discontinued     100 mg Oral Every 12 hours 12/24/18 0159 12/24/18 2118   12/24/18 0100  doxycycline (VIBRA-TABS) tablet 100 mg  Status:  Discontinued     100 mg Oral Every 12 hours 12/24/18 0056 12/24/18 0159   12/23/18 2245  cefTRIAXone (ROCEPHIN) 1 g in sodium chloride 0.9 % 100 mL IVPB     1  g 200 mL/hr over 30 Minutes Intravenous Every 24 hours 12/23/18 2233 12/27/18 2049      Microbiology: Results for orders placed or performed during the hospital encounter of 12/23/18  SARS CORONAVIRUS 2 (TAT 6-24 HRS) Nasopharyngeal Nasopharyngeal Swab     Status: None   Collection Time: 12/23/18 10:30 PM   Specimen: Nasopharyngeal Swab  Result Value Ref Range Status   SARS Coronavirus 2 NEGATIVE NEGATIVE Final    Comment: (NOTE) SARS-CoV-2 target nucleic acids are NOT DETECTED. The SARS-CoV-2 RNA is generally detectable in upper and lower respiratory specimens during the acute phase of infection. Negative results do not preclude SARS-CoV-2 infection, do not rule out co-infections with other pathogens, and should not be used as the sole basis for treatment or other patient management decisions. Negative results must be combined with clinical observations, patient history, and epidemiological information. The expected result is Negative. Fact Sheet for Patients: SugarRoll.be Fact Sheet for Healthcare Providers: https://www.woods-mathews.com/ This test is not yet approved or cleared by the [place] FDA and  has been authorized for detection and/or diagnosis of SARS-CoV-2 by FDA under an Emergency Use Authorization (EUA). This EUA will remain  in effect (meaning this test can be used) for the duration of the COVID-19 declaration under Section 56 4(b)(1) of the Act, 21 U.S.C. section 360bbb-3(b)(1), unless the authorization is terminated or revoked sooner. Performed at [facility], [place] [address] [place], [place] [address]   Culture, blood (routine x 2) Call MD if unable to obtain prior to antibiotics being given     Status: Abnormal   Collection Time: 12/24/18  1:42 AM   Specimen: BLOOD  Result Value Ref Range Status   Specimen Description   Final    BLOOD RIGHT FOREARM Performed at [facility], [address] [place], [place] [address]    Special Requests   Final    BOTTLES DRAWN AEROBIC AND ANAEROBIC Blood Culture adequate volume Performed at [facility], [address] [place], [place] [address]    Culture  Setup Time   Final    AEROBIC BOTTLE ONLY GRAM POSITIVE COCCI CRITICAL RESULT CALLED TO, READ BACK BY AND VERIFIED WITH: ROBBINS,J AT 2046 ON 12/24/2018 BY MOSLEY,J GRAM POSITIVE RODS ANAEROBIC BOTTLE ONLY CRITICAL RESULT CALLED TO, READ BACK BY AND VERIFIED WITH: DAVID BESANTI AT [place] ON 12/25/18 RWW    Culture (A)  Final    STAPHYLOCOCCUS SPECIES (COAGULASE NEGATIVE) THE SIGNIFICANCE OF ISOLATING THIS ORGANISM FROM A SINGLE SET OF BLOOD CULTURES WHEN MULTIPLE SETS ARE DRAWN IS UNCERTAIN. PLEASE NOTIFY THE MICROBIOLOGY DEPARTMENT WITHIN ONE WEEK IF SPECIATION AND SENSITIVITIES ARE REQUIRED. DIPHTHEROIDS(CORYNEBACTERIUM SPECIES) Standardized susceptibility testing for this organism is not available. Performed at [facility], [place] [address] [place], [place] [address]    Report Status 12/26/2018 FINAL  Final  Blood Culture ID Panel (Reflexed)     Status: Abnormal   Collection Time: 12/24/18  1:42 AM  Result Value Ref Range Status   Enterococcus species NOT DETECTED NOT DETECTED Final   Listeria monocytogenes NOT DETECTED NOT DETECTED Final   Staphylococcus species DETECTED (A) NOT DETECTED Final    Comment: Methicillin (oxacillin) resistant coagulase negative staphylococcus. Possible blood culture contaminant (unless isolated from more than  one blood culture draw or clinical case suggests pathogenicity). No antibiotic treatment is indicated for blood  culture contaminants. CRITICAL RESULT CALLED TO, READ BACK BY AND VERIFIED WITH: ROBBINS,J AT 2046 ON 12/24/2018 BY MOSLEY,J    Staphylococcus aureus (BCID) NOT DETECTED NOT DETECTED Final   Methicillin resistance DETECTED (A) NOT DETECTED Final    Comment: CRITICAL RESULT CALLED TO, READ BACK BY AND VERIFIED  WITH: ROBBINS,J AT 2046 ON 12/24/2018 BY MOSLEY,J    Streptococcus species NOT DETECTED NOT DETECTED Final   Streptococcus agalactiae NOT DETECTED NOT DETECTED Final   Streptococcus pneumoniae NOT DETECTED NOT DETECTED Final   Streptococcus pyogenes NOT DETECTED NOT DETECTED Final   Acinetobacter baumannii NOT DETECTED NOT DETECTED Final   Enterobacteriaceae species NOT DETECTED NOT DETECTED Final   Enterobacter cloacae complex NOT DETECTED NOT DETECTED Final   Escherichia coli NOT DETECTED NOT DETECTED Final   Klebsiella oxytoca NOT DETECTED NOT DETECTED Final   Klebsiella pneumoniae NOT DETECTED NOT DETECTED Final   Proteus species NOT DETECTED NOT DETECTED Final   Serratia marcescens NOT DETECTED NOT DETECTED Final   Haemophilus influenzae NOT DETECTED NOT DETECTED Final   Neisseria meningitidis NOT DETECTED NOT DETECTED Final   Pseudomonas aeruginosa NOT DETECTED NOT DETECTED Final   Candida albicans NOT DETECTED NOT DETECTED Final   Candida glabrata NOT DETECTED NOT DETECTED Final   Candida krusei NOT DETECTED NOT DETECTED Final   Candida parapsilosis NOT DETECTED NOT DETECTED Final   Candida tropicalis NOT DETECTED NOT DETECTED Final    Comment: Performed at [facility], [place]., [place], [place] [address]  Culture, blood (routine x 2)     Status: None   Collection Time: 12/25/18  9:25 AM   Specimen: BLOOD  Result Value Ref Range Status   Specimen Description BLOOD RIGHT ANTECUBITAL  Final   Special Requests   Final    BOTTLES DRAWN AEROBIC AND ANAEROBIC Blood Culture results may not be optimal due to an inadequate volume of blood received in culture bottles   Culture   Final    NO GROWTH 5 DAYS Performed at [facility], [address] [place], [place] [address]    Report Status 12/30/2018 FINAL  Final  Culture, blood (routine x 2)     Status: None   Collection Time: 12/25/18  9:34 AM   Specimen: BLOOD  Result Value Ref Range Status    Specimen Description BLOOD BLOOD RIGHT FOREARM  Final   Special Requests   Final    BOTTLES DRAWN AEROBIC AND ANAEROBIC Blood Culture adequate volume   Culture   Final    NO GROWTH 5 DAYS Performed at [facility], [address] [place], [place] [address]    Report Status 12/30/2018 FINAL  Final  MRSA PCR Screening     Status: None   Collection Time: 12/28/18  6:26 PM   Specimen: Nasal Mucosa; Nasopharyngeal  Result Value Ref Range Status   MRSA by PCR NEGATIVE NEGATIVE Final    Comment:        The GeneXpert MRSA Assay (FDA approved for NASAL specimens only), is one component of a comprehensive MRSA colonization surveillance program. It is not intended to diagnose MRSA infection nor to guide or monitor treatment for MRSA infections. Performed at [facility], [address] [place], [place] [address]     Best Practice/Protocols:  VTE Prophylaxis: Heparin (drip) GI Prophylaxis: Antihistamine    CULTURES: Blood x 2 11/8>>negative  Blood 11/8>>1/4 GPC  staph species Mec A +, now another 1/4 (2/4 total) GPR  MRSA 11/11>>negative    Studies: Dg Chest 2 View  Result Date: 12/23/2018 CLINICAL DATA:  Shortness of breath EXAM: CHEST - 2 VIEW COMPARISON:  February 06, 2018 and December 23, 2015 FINDINGS: There is atelectatic change in the right base region. There is mild stable interstitial thickening. There is no frank edema or consolidation. The heart is upper normal in size with pulmonary vascularity normal. No adenopathy. There is degenerative change in the thoracic spine. IMPRESSION: Stable interstitial thickening, likely representing a degree of chronic inflammatory change/underlying fibrosis. Mild atelectasis right base. No edema or consolidation. Stable cardiac silhouette. No evident adenopathy. Electronically Signed   By: Lowella Grip III M.D.   On: 12/23/2018 14:42   Ct Angio Chest Pe W And/or Wo Contrast  Result Date: 12/23/2018 CLINICAL DATA:   Shortness of breath with concern for PE. EXAM: CT ANGIOGRAPHY CHEST WITH CONTRAST TECHNIQUE: Multidetector CT imaging of the chest was performed using the standard protocol during bolus administration of intravenous contrast. Multiplanar CT image reconstructions and MIPs were obtained to evaluate the vascular anatomy. CONTRAST:  1mL OMNIPAQUE IOHEXOL 350 MG/ML SOLN COMPARISON:  None. FINDINGS: Cardiovascular: Evaluation for pulmonary emboli is limited by motion artifact. Given this limitation, no PE was identified. The main pulmonary artery is dilated measuring approximately 3.4 cm in diameter. There is no CT evidence of acute right heart strain. There are atherosclerotic changes of the thoracic aorta. The descending aorta is ectatic measuring up to approximately 3.6 cm in diameter. No evidence for an aneurysm. Heart size is enlarged. Coronary artery calcifications are noted. Mediastinum/Nodes: --No mediastinal or hilar lymphadenopathy. --No axillary lymphadenopathy. --No supraclavicular lymphadenopathy. --Normal thyroid gland. --The esophagus is unremarkable Lungs/Pleura: Emphysematous changes are noted bilaterally. There is no pneumothorax. No large pleural effusion. No focal infiltrate. Upper Abdomen: There is cholelithiasis without CT evidence for acute cholecystitis. There is a moderate-sized hiatal hernia. Musculoskeletal: No chest wall abnormality. No acute or significant osseous findings. Review of the MIP images confirms the above findings. IMPRESSION: 1. Evaluation for pulmonary emboli is limited by motion artifact. Given this limitation, no PE was identified. 2. Cardiomegaly with coronary artery disease. 3. Moderate-sized hiatal hernia. 4. Cholelithiasis without CT evidence for acute cholecystitis. 5. Emphysema and aortic atherosclerosis. Aortic Atherosclerosis (ICD10-I70.0) and Emphysema (ICD10-J43.9). Electronically Signed   By: Constance Holster M.D.   On: 12/23/2018 19:59   Dg Chest Port 1  View  Result Date: 12/31/2018 CLINICAL DATA:  Acute respiratory failure. EXAM: PORTABLE CHEST 1 VIEW COMPARISON:  12/23/2018 FINDINGS: Cardiac enlargement. Aortic atherosclerosis. Chronic interstitial changes of emphysema. No pleural effusion or edema. No superimposed airspace consolidation. IMPRESSION: 1. Stable cardiac enlargement and aortic atherosclerosis. 2. Emphysema Electronically Signed   By: Kerby Moors M.D.   On: 12/31/2018 05:51   Dg Chest Port 1 View  Result Date: 12/28/2018 CLINICAL DATA:  Dyspnea, COPD EXAM: PORTABLE CHEST 1 VIEW COMPARISON:  12/24/2018 chest radiograph. FINDINGS: Stable cardiomediastinal silhouette with mild cardiomegaly. No pneumothorax. No pleural effusion. Emphysema. No overt pulmonary edema. No acute consolidative airspace disease. IMPRESSION: Stable mild cardiomegaly without overt pulmonary edema. No acute pulmonary disease. Emphysema. Electronically Signed   By: Ilona Sorrel M.D.   On: 12/28/2018 17:32   Dg Chest Port 1 View  Result Date: 12/24/2018 CLINICAL DATA:  Cough, SOB, weakness. Hx of wheezing, HTN, COPD, a-fib. Former smoker. EXAM: PORTABLE CHEST 1 VIEW COMPARISON:  Chest radiograph 02/06/2018 FINDINGS: Stable cardiomediastinal contours. Mildly enlarged  heart size. Bilateral diffuse coarse interstitial opacities with basilar predominance likely representing chronic lung disease. No new focal opacity. No pneumothorax or large pleural effusion. No acute finding in the visualized skeleton. IMPRESSION: Stable chest x-ray with chronic coarse interstitial opacities, basilar predominance. No acute cardiopulmonary findings. Electronically Signed   By: Audie Pinto M.D.   On: 12/24/2018 15:04      Subjective:    Overnight Issues: Quiet Precedex overnight.  Was finally able to sleep at 2 AM.  She has not slept "in days".  Patient uses Ativan as an outpatient.  Note that she is also on BuSpar.   Objective:  Vital signs for last 24 hours: Temp:  [93.6  F (34.2 C)-97.8 F (36.6 C)] 97.5 F (36.4 C) (11/14 0800) Pulse Rate:  [45-112] 67 (11/14 0600) Resp:  [17-26] 20 (11/14 0600) BP: (109-149)/(71-112) 146/100 (11/14 0737) SpO2:  [92 %-100 %] 99 % (11/14 0600) FiO2 (%):  [30 %] 30 % (11/14 0200)  Hemodynamic parameters for last 24 hours:    Intake/Output from previous day: 11/13 0701 - 11/14 0700 In: 656.6 [I.V.:556.6; IV Piggyback:100] Out: 1112 [Urine:1112]  Intake/Output this shift: Total I/O In: -  Out: 150 [Urine:150]  Vent settings for last 24 hours: FiO2 (%):  [30 %] 30 %  Physical Exam:  General:  Chronically ill appearing female, currently on nasal cannula at 4 L/min, lethargic Neuro:  Lethargic, not following commands, PERRL  HEENT: No JVD present  Cardiovascular: irregular irregular, no R/G  Lungs: diminished throughout, no wheezes, poor air movement bilaterally Abdomen: +BS x4, obese, soft, non tender, non distended  Musculoskeletal: normal bulk and tone, trace generalized edema  Skin: diffuse scattered ecchymosis, bilateral upper extremity skin tears   Assessment/Plan:    Acute on chronic hypoxic and hypercarbic respiratory failure secondary AECOPD  Hx: Cough, Chronic Home O2 @2L , and Orthopnea  Cor pulmonale/pulmonary hypertension On RA with spO2 99 requiring sedation Scheduled and PRN  bronchodilator therapy  stop doxycycline  CXR with pulmonary interstitial inflitrates - lasix 40 daily Nebulized steroids  PRN morphine for air hunger  -completed 5 days of rocephin, 1 day of vancomycin, and now 6 days of doxycycline -will stop anbibiotics now  Paroxysmal atrial fibrillation  Hx: HTN, Stroke, and HLD Continuous telemetry monitoring  PRN IV metoprolol for heart rate control  Continue outpatient cardiac medications once pt able to tolerate po  Cardiology following-appreciate input   Leukocytosis: 2/4 Blood Cultures positive for staph species Mec A possible contaminant  Trend WBC and monitor fever  curve Trend PCT Follow cultures  Abx : Ceftriaxone 11/6>>11/10 Vancomycin 11/7>>11/8 Doxycycline 11/9>>  Hyperglycemia  CBG's q4hrs  SSI   Acute encephalopathy pt with underlying dementia  Frequent reorientation  Precedex gtt-will wean today      Critical care provider statement:    Critical care time (minutes):  32   Critical care time was exclusive of:  Separately billable procedures and  treating other patients   Critical care was necessary to treat or prevent imminent or  life-threatening deterioration of the following conditions:  Acute copd exacerbation, paroxysmal atrial fibrillation, pulmonary arterial hypertension, mobid obesity, multiple comorbid conditions   Critical care was time spent personally by me on the following  activities:  Development of treatment plan with patient or surrogate,  discussions with consultants, evaluation of patient's response to  treatment, examination of patient, obtaining history from patient or  surrogate, ordering and performing treatments and interventions, ordering  and review of laboratory studies and  re-evaluation of patient's condition   I assumed direction of critical care for this patient from another  provider in my specialty: no

## 2018-12-31 NOTE — Consult Note (Signed)
ANTICOAGULATION CONSULT NOTE  Pharmacy Consult for heparin infusion Indication: atrial fibrillation  Patient Measurements: Height: 5\' 3"  (160 cm) Weight: 210 lb 5.1 oz (95.4 kg) IBW/kg (Calculated) : 52.4 Heparin Dosing Weight: 74.5 kg  Vital Signs: Temp: 97.5 F (36.4 C) (11/13 2000) Temp Source: Axillary (11/13 2000) BP: 141/85 (11/13 2200) Pulse Rate: 76 (11/13 2200)  Labs: Recent Labs    12/28/18 0607 12/29/18 0520  12/29/18 1511 12/29/18 2344 12/30/18 0450 12/30/18 1056 12/30/18 2209  HGB 15.2* 14.7  --   --   --  14.8  --   --   HCT 45.6 45.2  --   --   --  45.1  --   --   PLT 311 253  --   --   --  253  --   --   APTT  --   --    < > 29 >160*  --  131* 75*  LABPROT  --   --   --  16.4*  --   --   --   --   INR  --   --   --  1.3*  --   --   --   --   HEPARINUNFRC  --   --   --  2.54* 1.54*  --   --   --   CREATININE 1.01* 1.07*  --   --   --  0.94  --   --    < > = values in this interval not displayed.    Estimated Creatinine Clearance: 57.7 mL/min (by C-G formula based on SCr of 0.94 mg/dL).   Medical History: Past Medical History:  Diagnosis Date  . A-fib ([place])   . Anxiety   . COPD (chronic obstructive pulmonary disease) ([place])   . Cough    CHONIC  . Depression   . Dysrhythmia   . GERD (gastroesophageal reflux disease)   . Heart disease   . High cholesterol   . Hypertension   . Hypothyroidism   . Orthopnea   . Oxygen dependent    2/L CONTINUOUSLY  . Stroke ([place])   . Vertigo   . Wheezing     Medications:  Scheduled:  . bisoprolol  5 mg Oral BID  . budesonide (PULMICORT) nebulizer solution  0.5 mg Nebulization BID  . calcium-vitamin D  1 tablet Oral BID  . Chlorhexidine Gluconate Cloth  6 each Topical Daily  . citalopram  20 mg Oral QHS  . cloNIDine  0.3 mg Transdermal Weekly  . guaiFENesin  600 mg Oral BID  . insulin aspart  0-20 Units Subcutaneous Q4H  . ipratropium-albuterol  3 mL Nebulization Q6H  . levothyroxine  150 mcg Oral QAC  breakfast  . LORazepam  0.5 mg Intravenous TID  . mouth rinse  15 mL Mouth Rinse BID  . nystatin  5 mL Oral QID  . pravastatin  20 mg Oral q1800  . QUEtiapine  25 mg Oral QHS  . sodium chloride flush  3 mL Intravenous Q12H    Assessment: 73 y.o. female  admitted with acute on chronic hypoxic and hypercarbic respiratory failure secondary to AECOPD requiring BiPAP  Chest x-ray negative for pneumonia or fluid. Transferred to ICU stepdown for BiPAP 11/11. Prior to this note she has been on apixaban. However, she is unable to swallow oral medications. Her last dose of apixaban was 12/28/18 at 0804 am. H&H, PLT currently wnl  Heparin Course: 11/12 initiation: 4000 unit bolus, then 1100 units/hr  11/12 2344 aPTT > 160, HL 1.54 reduce rate to 900 units/hr 11/13 1056 aPTT 131s: reduce rate to 600 units/hr 11/13 2209 aPTT 75s: therapeutic, continue current rate and f/u aPTT in am to confirm  Goal of Therapy:  Heparin level 0.3-0.7 units/ml aPTT 66 - 102s Monitor platelets by anticoagulation protocol: Yes   Plan:   Continue heparin infusion at 600 units/hr  Check aPTT in am to confirm  aPTT will be used to guide therapy until aPTT and anti-Xa correlate. Next heparin level in am  Continue to monitor H&H and platelets  Ena Dawley, PharmD 12/31/2018,12:06 AM

## 2019-01-01 LAB — GLUCOSE, CAPILLARY
Glucose-Capillary: 107 mg/dL — ABNORMAL HIGH (ref 70–99)
Glucose-Capillary: 116 mg/dL — ABNORMAL HIGH (ref 70–99)
Glucose-Capillary: 118 mg/dL — ABNORMAL HIGH (ref 70–99)
Glucose-Capillary: 122 mg/dL — ABNORMAL HIGH (ref 70–99)
Glucose-Capillary: 145 mg/dL — ABNORMAL HIGH (ref 70–99)
Glucose-Capillary: 146 mg/dL — ABNORMAL HIGH (ref 70–99)
Glucose-Capillary: 78 mg/dL (ref 70–99)
Glucose-Capillary: 86 mg/dL (ref 70–99)

## 2019-01-01 LAB — COMPREHENSIVE METABOLIC PANEL
ALT: 62 U/L — ABNORMAL HIGH (ref 0–44)
AST: 52 U/L — ABNORMAL HIGH (ref 15–41)
Albumin: 3.3 g/dL — ABNORMAL LOW (ref 3.5–5.0)
Alkaline Phosphatase: 43 U/L (ref 38–126)
Anion gap: 9 (ref 5–15)
BUN: 34 mg/dL — ABNORMAL HIGH (ref 8–23)
CO2: 32 mmol/L (ref 22–32)
Calcium: 8.8 mg/dL — ABNORMAL LOW (ref 8.9–10.3)
Chloride: 112 mmol/L — ABNORMAL HIGH (ref 98–111)
Creatinine, Ser: 0.91 mg/dL (ref 0.44–1.00)
GFR calc Af Amer: >60 mL/min (ref >60)
GFR calc non Af Amer: >60 mL/min (ref >60)
Glucose, Bld: 100 mg/dL — ABNORMAL HIGH (ref 70–99)
Potassium: 3.3 mmol/L — ABNORMAL LOW (ref 3.5–5.1)
Sodium: 153 mmol/L — ABNORMAL HIGH (ref 135–145)
Total Bilirubin: 3.1 mg/dL — ABNORMAL HIGH (ref 0.3–1.2)
Total Protein: 6.1 g/dL — ABNORMAL LOW (ref 6.5–8.1)

## 2019-01-01 LAB — CBC
HCT: 49.7 % — ABNORMAL HIGH (ref 36.0–46.0)
Hemoglobin: 15.8 g/dL — ABNORMAL HIGH (ref 12.0–15.0)
MCH: 30.4 pg (ref 26.0–34.0)
MCHC: 31.8 g/dL (ref 30.0–36.0)
MCV: 95.6 fL (ref 80.0–100.0)
Platelets: 209 10*3/uL (ref 150–400)
RBC: 5.2 MIL/uL — ABNORMAL HIGH (ref 3.87–5.11)
RDW: 14.7 % (ref 11.5–15.5)
WBC: 14.7 10*3/uL — ABNORMAL HIGH (ref 4.0–10.5)
nRBC: 0.2 % (ref 0.0–0.2)

## 2019-01-01 LAB — HEPARIN LEVEL (UNFRACTIONATED): Heparin Unfractionated: 0.83 IU/mL — ABNORMAL HIGH (ref 0.30–0.70)

## 2019-01-01 LAB — PHOSPHORUS: Phosphorus: 3 mg/dL (ref 2.5–4.6)

## 2019-01-01 LAB — MAGNESIUM: Magnesium: 2.8 mg/dL — ABNORMAL HIGH (ref 1.7–2.4)

## 2019-01-01 LAB — APTT: aPTT: 84 seconds — ABNORMAL HIGH (ref 24–36)

## 2019-01-01 MED ORDER — LORAZEPAM 0.5 MG PO TABS
0.5000 mg | ORAL_TABLET | Freq: Three times a day (TID) | ORAL | Status: DC
  Administered 2019-01-01 – 2019-01-03 (×7): 0.5 mg via ORAL
  Filled 2019-01-01 (×7): qty 1

## 2019-01-01 MED ORDER — DILTIAZEM HCL ER COATED BEADS 240 MG PO CP24
240.0000 mg | ORAL_CAPSULE | Freq: Every day | ORAL | Status: DC
  Administered 2019-01-01: 240 mg via ORAL
  Filled 2019-01-01: qty 1

## 2019-01-01 MED ORDER — DILTIAZEM HCL ER COATED BEADS 180 MG PO CP24
300.0000 mg | ORAL_CAPSULE | Freq: Every day | ORAL | Status: DC
  Administered 2019-01-02 – 2019-01-03 (×2): 300 mg via ORAL
  Filled 2019-01-01 (×2): qty 1

## 2019-01-01 MED ORDER — APIXABAN 5 MG PO TABS
5.0000 mg | ORAL_TABLET | Freq: Two times a day (BID) | ORAL | Status: DC
  Administered 2019-01-01 – 2019-01-03 (×5): 5 mg via ORAL
  Filled 2019-01-01 (×5): qty 1

## 2019-01-01 NOTE — Evaluation (Signed)
Physical Therapy Re-evaluation Patient Details Name: Summer Bradley MRN: [id number] DOB: 06-Jan-1946 Today's Date: 01/01/2019   History of Present Illness  Pt is a 73 year old female admitted with atrial fibrillation with RVR and COPD exacerbation following c/o weakness.  PMH includes depression, anxiety, atrial fibrillation and CVA.  Clinical Impression  Pt is a 73 year old female who lives with her son and is a limited community ambulator with 3WW at baseline.  Pt mildly lethargic upon PT arrival but willing to get up.  She required Mod A +2 for bed mobility and was able to sit at EOB with fair sitting balance and supervision.  Pt presented with fair to good strength of UE/LE's.  Pt able to stand on first attempt from CCU bed and transfer slowly with cues to initiate steps and assistance to manage equipment.  PT assisted pt in eccentric control to chair as she paused just as she was to sit.  Pt attempted to scoot back in chair but ultimately required +2 assist.  Pt will continue to benefit from skilled PT with focus on strength, tolerance to activity and safe functional mobility.     Patient suffers from COPD which impairs her ability to perform daily activities like toileting, feeding, dressing, grooming, bathing in the home. A cane, walker, crutch will not resolve the patient's issue with performing activities of daily living. A lightweight wheelchair is required/recommended and will allow patient to safely perform daily activities.   Patient can safely propel the wheelchair in the home or has a caregiver who can provide assistance.      Follow Up Recommendations SNF(Should pt return home, she will need 24 hr supervison.)    Equipment Recommendations  Rolling walker with 5" wheels;Wheelchair (measurements PT);Hospital bed;3in1 (PT);Wheelchair cushion (measurements PT)    Recommendations for Other Services       Precautions / Restrictions Precautions Precautions:  Fall Restrictions Weight Bearing Restrictions: No      Mobility  Bed Mobility Overal bed mobility: Needs Assistance Bed Mobility: Supine to Sit;Sit to Supine     Supine to sit: Mod assist     General bed mobility comments: Hand held assist to bring trunk upright and LE's over EOB.  Pt's daughter assisted.  Transfers Overall transfer level: Needs assistance Equipment used: 2 person hand held assist Transfers: Stand Pivot Transfers   Stand pivot transfers: Mod assist       General transfer comment: Able to rise from higher bedside 2x on first attempt.  Assistance needed to initiate steps and eccentric control to chair.(Pateint needs VC for satey and O2 saturation levels)  Ambulation/Gait                Stairs            Wheelchair Mobility    Modified Rankin (Stroke Patients Only)       Balance Overall balance assessment: Needs assistance Sitting-balance support: Bilateral upper extremity supported Sitting balance-Leahy Scale: Fair     Standing balance support: Bilateral upper extremity supported Standing balance-Leahy Scale: Fair                               Pertinent Vitals/Pain      Home Living Family/patient expects to be discharged to:: Private residence Living Arrangements: Children(Son) Available Help at Discharge: Family;Personal care attendant;Available 24 hours/day(Daughter: pt will live with her son and [facility] will provide services during the day.  If need be,  she will move her mother in with her.) Type of Home: House Home Access: Stairs to enter Entrance Stairs-Rails: Right Entrance Stairs-Number of Steps: 2 Home Layout: One level Home Equipment: Walker - 2 wheels;Cane - single point      Prior Function Level of Independence: Independent with assistive device(s)               Hand Dominance   Dominant Hand: Right    Extremity/Trunk Assessment   Upper Extremity Assessment Upper Extremity Assessment:  Overall WFL for tasks assessed    Lower Extremity Assessment Lower Extremity Assessment: Overall WFL for tasks assessed(Grossly bilaterally ankle DF/PF, knee flexion/extension: 4/5, hip flexion: 4-/5)    Cervical / Trunk Assessment Cervical / Trunk Assessment: Kyphotic  Communication   Communication: No difficulties  Cognition                                              General Comments      Exercises     Assessment/Plan    PT Assessment Patient needs continued PT services  PT Problem List Decreased strength;Decreased activity tolerance;Decreased balance;Decreased mobility       PT Treatment Interventions Gait training;Therapeutic activities;Therapeutic exercise    PT Goals (Current goals can be found in the Care Plan section)  Acute Rehab PT Goals Patient Stated Goal: Patient wants to walk PT Goal Formulation: With patient Time For Goal Achievement: 01/08/19 Potential to Achieve Goals: Good    Frequency Min 2X/week   Barriers to discharge        Co-evaluation               AM-PAC PT "6 Clicks" Mobility  Outcome Measure Help needed turning from your back to your side while in a flat bed without using bedrails?: A Little Help needed moving from lying on your back to sitting on the side of a flat bed without using bedrails?: A Little Help needed moving to and from a bed to a chair (including a wheelchair)?: A Lot Help needed standing up from a chair using your arms (e.g., wheelchair or bedside chair)?: A Lot Help needed to walk in hospital room?: A Lot Help needed climbing 3-5 steps with a railing? : A Lot 6 Click Score: 14    End of Session Equipment Utilized During Treatment: Gait belt Activity Tolerance: Patient limited by fatigue Patient left: in chair;with call bell/phone within reach;with family/visitor present;with nursing/sitter in room Nurse Communication: Mobility status PT Visit Diagnosis: Muscle weakness (generalized)  (M62.81);Difficulty in walking, not elsewhere classified (R26.2)    Time: [id number] PT Time Calculation (min) (ACUTE ONLY): 30 min   Charges:   PT Evaluation $PT Eval Low Complexity: 1 Low $PT Re-evaluation: 1 Re-eval PT Treatments $Therapeutic Activity: 8-22 mins        Roxanne Gates, PT, DPT   Roxanne Gates 01/01/2019, 5:50 PM

## 2019-01-01 NOTE — Progress Notes (Signed)
Notified Camnitz of HR increase. Hr 100 - 120's. Pt is asymptomatic.  See new orders. Will continue to monitor.

## 2019-01-01 NOTE — Consult Note (Signed)
ANTICOAGULATION CONSULT NOTE  Pharmacy Consult for heparin infusion Indication: atrial fibrillation  Patient Measurements: Height: 5\' 3"  (160 cm) Weight: 210 lb 5.1 oz (95.4 kg) IBW/kg (Calculated) : 52.4 Heparin Dosing Weight: 74.5 kg  Vital Signs: Temp: 97.6 F (36.4 C) (11/15 0230) Temp Source: Oral (11/15 0230) BP: 126/88 (11/15 0300) Pulse Rate: 84 (11/15 0300)  Labs: Recent Labs    12/29/18 1511 12/29/18 2344 12/30/18 0450  12/31/18 0652 12/31/18 0653 12/31/18 1757 01/01/19 0152  HGB  --   --  14.8  --  15.4*  --   --  15.8*  HCT  --   --  45.1  --  48.2*  --   --  49.7*  PLT  --   --  253  --  224  --   --  209  APTT 29 >160*  --    < > 65*  --  61* 84*  LABPROT 16.4*  --   --   --   --   --   --   --   INR 1.3*  --   --   --   --   --   --   --   HEPARINUNFRC 2.54* 1.54*  --   --  1.05*  --   --  0.83*  CREATININE  --   --  0.94  --   --  0.81  --  0.91   < > = values in this interval not displayed.    Estimated Creatinine Clearance: 59.6 mL/min (by C-G formula based on SCr of 0.91 mg/dL).    Assessment: 73 y.o. female  admitted with acute on chronic hypoxic and hypercarbic respiratory failure secondary to AECOPD requiring BiPAP  Chest x-ray negative for pneumonia or fluid. Transferred to ICU stepdown for BiPAP 11/11. Prior to this note she has been on apixaban. However, she is unable to swallow oral medications. Her last dose of apixaban was 12/28/18 at 0804 am. H&H, PLT currently wnl  Heparin Course: 11/12 initiation: 4000 unit bolus, then 1100 units/hr 11/12 2344 aPTT > 160, HL 1.54 reduce rate to 900 units/hr 11/13 1056 aPTT 131s: reduce rate to 600 units/hr 11/13 2209 aPTT 75s: therapeutic, continue current rate and f/u aPTT in am to confirm 11/14 0652 aPTT 65, slightly subtherapeutic, HL 1.05, not yet correlating 11/14 1757 aPTT 61 @ 700 units/hr 11/15 0152 aPTT 84 on 800 units/hr, therapeutic x 1  Goal of Therapy:  Heparin level 0.3-0.7  units/ml aPTT 66 - 102s Monitor platelets by anticoagulation protocol: Yes   Plan:   Continue heparin drip at 800 units/hr  Check aPTT in 8h to confirm  aPTT will be used to guide therapy until aPTT and anti-Xa correlate. Next heparin level in am  CBC in AM, continue to monitor H&H and platelets  Ena Dawley, PharmD Clinical Pharmacist 01/01/2019 3:40 AM

## 2019-01-01 NOTE — Progress Notes (Signed)
Pt alert and Orient X 3, disoriented to time. Sitting up in recliner. Daughter at bedside. Denies SOB, Pain at this time. Will continue to monitor.

## 2019-01-01 NOTE — Progress Notes (Signed)
PT Cancellation Note  Patient Details Name: Summer Bradley MRN: [id number] DOB: 1945-08-24   Cancelled Treatment:    Reason Eval/Treat Not Completed: Patient's level of consciousness.  Order received and chart reviewed.  Pt unarousable at this time.  Per pt's family member in room, pt has just taken medication that makes her drowsy and will need a few hours before she will be able to participate.  Will check back at end of day.  Roxanne Gates, PT, DPT  Roxanne Gates 01/01/2019, 1:54 PM

## 2019-01-01 NOTE — Progress Notes (Addendum)
Progress Note  Patient Name: Summer Bradley Date of Encounter: 01/01/2019  Primary Cardiologist: New to [facility] - consult by End  Subjective   Agitated overnight, yelling. Remains in Afib with reasonably controlled ventricular response over the past 24 hours with diltiazem gtt. Still not taking PO medications. Labs pending this morning. Sleeping. Remains on intermittent Presedex gtt secondary to agitation/delirium. Potassium low this morning at 3.3.   Inpatient Medications    Scheduled Meds: . bisoprolol  5 mg Oral BID  . budesonide (PULMICORT) nebulizer solution  0.5 mg Nebulization BID  . busPIRone  10 mg Oral BID  . calcium-vitamin D  1 tablet Oral BID  . Chlorhexidine Gluconate Cloth  6 each Topical Daily  . citalopram  20 mg Oral QHS  . cloNIDine  0.3 mg Transdermal Weekly  . furosemide  40 mg Intravenous Daily  . guaiFENesin  600 mg Oral BID  . insulin aspart  0-20 Units Subcutaneous Q4H  . ipratropium-albuterol  3 mL Nebulization Q6H  . levothyroxine  150 mcg Oral QAC breakfast  . LORazepam  0.5 mg Intravenous TID  . mouth rinse  15 mL Mouth Rinse BID  . nystatin  5 mL Oral QID  . pravastatin  20 mg Oral q1800  . QUEtiapine  25 mg Oral QHS  . sodium chloride flush  3 mL Intravenous Q12H   Continuous Infusions: . sodium chloride Stopped (12/27/18 2247)  . dexmedetomidine (PRECEDEX) IV infusion Stopped (12/31/18 1255)  . diltiazem (CARDIZEM) infusion 5 mg/hr (01/01/19 0627)  . famotidine (PEPCID) IV 20 mg (12/31/18 2202)  . heparin 800 Units/hr (01/01/19 0627)   PRN Meds: sodium chloride, acetaminophen **OR** acetaminophen, guaiFENesin-codeine, hydrALAZINE, levalbuterol, magnesium hydroxide, metoprolol tartrate, morphine injection, polyvinyl alcohol, sodium chloride flush, traZODone   Vital Signs    Vitals:   01/01/19 0430 01/01/19 0500 01/01/19 0530 01/01/19 0600  BP: 123/86 125/80 140/87 123/80  Pulse: 81 86 89 98  Resp: (!) 23 (!) 23 20 (!) 26  Temp:   97.6 F (36.4 C)    TempSrc:  Axillary    SpO2: 97% 98% 97% 95%  Weight:      Height:        Intake/Output Summary (Last 24 hours) at 01/01/2019 0747 Last data filed at 01/01/2019 [id number] Gross per 24 hour  Intake 886.58 ml  Output 2975 ml  Net -2088.42 ml   Filed Weights   12/27/18 0606 12/28/18 0302 12/28/18 1819  Weight: 96.3 kg 94.8 kg 95.4 kg    Telemetry    Afib with reasonably controlled ventricular response - Personally Reviewed  ECG    No new tracings - Personally Reviewed  Physical Exam   GEN: No acute distress. Intermittently agitated. Off Precedex this morning.  Neck: No JVD. Cardiac: Irregularly irregular, no murmurs, rubs, or gallops.  Respiratory: Diminished breath sounds bilateral bases.  GI: Soft, nontender, non-distended.   MS: Trace bilateral ankle edema; No deformity. Neuro:  More alert.  Psych: Intermittent agitation.  Labs    Chemistry Recent Labs  Lab 12/30/18 0450 12/31/18 0653 01/01/19 0152  NA 148* 151* 153*  K 3.8 3.8 3.3*  CL 109 115* 112*  CO2 29 26 32  GLUCOSE 123* 110* 100*  BUN 42* 40* 34*  CREATININE 0.94 0.81 0.91  CALCIUM 9.0 8.8* 8.8*  PROT  --   --  6.1*  ALBUMIN  --  3.3* 3.3*  AST  --   --  52*  ALT  --   --  20*  ALKPHOS  --   --  43  BILITOT  --   --  3.1*  GFRNONAA 60* >60 >60  GFRAA >60 >60 >60  ANIONGAP 10 10 9      Hematology Recent Labs  Lab 12/30/18 0450 12/31/18 0652 01/01/19 0152  WBC 17.6* 12.6* 14.7*  RBC 4.78 5.02 5.20*  HGB 14.8 15.4* 15.8*  HCT 45.1 48.2* 49.7*  MCV 94.4 96.0 95.6  MCH 31.0 30.7 30.4  MCHC 32.8 32.0 31.8  RDW 14.8 14.8 14.7  PLT 253 224 209    Cardiac EnzymesNo results for input(s): TROPONINI in the last 168 hours. No results for input(s): TROPIPOC in the last 168 hours.   BNP Recent Labs  Lab 12/29/18 0521  BNP 518.0*     DDimer No results for input(s): DDIMER in the last 168 hours.   Radiology    Dg Chest Port 1 View  Result Date: 12/28/2018  IMPRESSION: Stable mild cardiomegaly without overt pulmonary edema. No acute pulmonary disease. Emphysema. Electronically Signed   By: Ilona Sorrel M.D.   On: 12/28/2018 17:32    Cardiac Studies   2D echo 12/25/2018: 1. Left ventricular ejection fraction, by visual estimation, is 55 to 60%. The left ventricle has normal function. There is no left ventricular hypertrophy.  2. Global right ventricle has normal systolic function.The right ventricular size is mildly enlarged. No increase in right ventricular wall thickness.  3. Left atrial size was mildly dilated.  4. Tricuspid valve regurgitation mild-moderate.  5. Moderately elevated pulmonary artery systolic pressure.  6. The tricuspid regurgitant velocity is 2.88 m/s, and with an assumed right atrial pressure of 10 mmHg, the estimated right ventricular systolic pressure is moderately elevated at 43.1 mmHg.  7. Rhythm is atrial fibrillation  8. Challenging image quality  Patient Profile     73 y.o. female with history of PAF, chronic hypoxic respiratory failure on supplemental oxygen at home, COPD, and HTN who we are seeing for Afib.   Assessment & Plan    1. Persistent Afib: -Ventricular rates improved on diltiazem gtt, continue -With agitation, rates become tachycardic -Requiring intermittent Precedex -Remains on heparin gtt in place of Eliquis secondary to delirium -Transition to PO medications when able to safely  -Not currently a candidate for TEE-guided DCCV secondary to the above -Secondly, in her acute illness, she would be unlikely to hold sinus rhythm at this time -Likely in the setting of acute illness and agitation  -Once her acute illness is improved, if she remains in Afib, TEE-guided DCCV can be considered pending GOC moving forward   2. Acute on chronic hypoxic respiratory distress: -Secondary to AECOPD -Per IM  3. Delirium with underlying dementia/agitation: -Transferred to the ICU on 11/11 -Transferred to  heparin gtt 11/12 in the setting of increased somnolence   -Cannot exclude withdrawing from benzo -Requiring intermittent Precedex gtt -Has previously required tach and G-tube for similar presentation -No improvement in symptoms despite BiPAP and adequate blood gases   For questions or updates, please contact [facility] Please consult [email or web address] for contact info under Cardiology/STEMI.    Signed, Christell Faith, PA-C [facility] Pager: [phone] 01/01/2019, 7:47 AM   I have seen and examined this patient with Christell Faith.  Agree with above, note added to reflect my findings.  On exam, irregular, no murmurs.  Patient remains in atrial fibrillation.  She is currently awake and doing well.  She is being transitioned to p.o. medications.  She has already had her  Eliquis and is trying on p.o. diltiazem 240 mg daily.  If this does not control her heart rate, can certainly increase the dose.  Elisse Pennick M. Teofilo Lupinacci MD 01/01/2019 1:05 PM

## 2019-01-01 NOTE — Progress Notes (Signed)
Follow up - Critical Care Medicine Note  Patient Details:    Summer Bradley is an 73 y.o. female  admitted with acute on chronic hypoxic and hypercarbic respiratory failure secondary to AECOPD requiring BiPAP.  She has very severe COPD (stage IV) and has deteriorated due to delirium.     Events: 11/6: Pt admitted to the telemetry unit  11/6: CTA Chest revealed evaluation for pulmonary emboli is limited by motion artifact. Given this limitation, no PE was identified. Cardiomegaly with coronary artery disease. Moderate-sized hiatal hernia. Cholelithiasis without CT evidence for acute cholecystitis. Emphysema and aortic atherosclerosis.Aortic Atherosclerosis (ICD10-I70.0) and Emphysema (ICD10-J43.9). 11/7: CT Head/Cervical Spine revealed small left frontal scalp contusion. No calvarial fracture. No acute intracranial abnormality. No acute fracture or dislocation of cervical spine. Large chronic right MCA distribution infarction as well as small infarctions of right cerebellar hemisphere and left thalamus. Advanced cervical spondylosis with prominent left-sided facet arthropathy. 11/8: Echo revealed left ventricular ejection fraction, by visual estimation, is 55 to 60%. The left ventricle has normal function. There is no left ventricular hypertrophy. Global right ventricle has normal systolic function.The right ventricular size is mildly enlarged. No increase in right ventricular wall thickness. 11/11: Rapid response initiated due to pt developing worsening acute on chronic respiratory failure and encephalopathy requiring transfer to the stepdown unit for BiPAP 11/12: Lethargic today.  Required Precedex due to delirium, this is now off. 11/13 - on 0.8 of precedex for aggitation, heparin drip for AF, cardizem drip for accelerated htn.  Patient is DNR.  Palliative GOC done - family wants to keep trying for now 11/14-stopping precedex gtt 11/15- patient did well without precedex. She takes ativan at  home.  She was able to take po meds today, we will try to transition cardizem and anticoagulation to po today, OOB with PT    Lines, Airways, Drains: Urethral Catheter [name] Latex 16 Fr. (Active)  Indication for Insertion or Continuance of Catheter Acute urinary retention (I&O Cath for 24 hrs prior to catheter insertion- Inpatient Only) 12/29/18 1400  Site Assessment Clean;Intact 12/29/18 1400  Date Prophylactic Dressing Applied (if applicable) [id number]  Catheter Maintenance Bag below level of bladder;Catheter secured;Drainage bag/tubing not touching floor;Insertion date on drainage bag;No dependent loops;Seal intact 12/29/18 1400  Collection Container Standard drainage bag 12/29/18 1400  Securement Method Securing device (Describe) 12/29/18 1400  Output (mL) 125 mL 12/29/18 1200    Anti-infectives:  Anti-infectives (From admission, onward)   Start     Dose/Rate Route Frequency Ordered Stop   12/28/18 2045  doxycycline (VIBRAMYCIN) 100 mg in sodium chloride 0.9 % 250 mL IVPB  Status:  Discontinued     100 mg 125 mL/hr over 120 Minutes Intravenous Every 12 hours 12/28/18 2038 12/30/18 1018   12/26/18 2200  doxycycline (VIBRA-TABS) tablet 100 mg  Status:  Discontinued     100 mg Oral Every 12 hours 12/26/18 1655 12/28/18 2038   12/25/18 2200  vancomycin (VANCOCIN) IVPB 1000 mg/200 mL premix  Status:  Discontinued     1,000 mg 200 mL/hr over 60 Minutes Intravenous Every 24 hours 12/24/18 2125 12/25/18 1107   12/24/18 2130  vancomycin (VANCOCIN) 2,000 mg in sodium chloride 0.9 % 500 mL IVPB     2,000 mg 250 mL/hr over 120 Minutes Intravenous  Once 12/24/18 2118 12/25/18 0014   12/24/18 1000  azithromycin (ZITHROMAX) tablet 500 mg  Status:  Discontinued     500 mg Oral Daily 12/23/18 2233 12/24/18 0056   12/24/18  1000  doxycycline (VIBRA-TABS) tablet 100 mg  Status:  Discontinued     100 mg Oral Every 12 hours 12/24/18 0159 12/24/18 2118   12/24/18 0100   doxycycline (VIBRA-TABS) tablet 100 mg  Status:  Discontinued     100 mg Oral Every 12 hours 12/24/18 0056 12/24/18 0159   12/23/18 2245  cefTRIAXone (ROCEPHIN) 1 g in sodium chloride 0.9 % 100 mL IVPB     1 g 200 mL/hr over 30 Minutes Intravenous Every 24 hours 12/23/18 2233 12/27/18 2049      Microbiology: Results for orders placed or performed during the hospital encounter of 12/23/18  SARS CORONAVIRUS 2 (TAT 6-24 HRS) Nasopharyngeal Nasopharyngeal Swab     Status: None   Collection Time: 12/23/18 10:30 PM   Specimen: Nasopharyngeal Swab  Result Value Ref Range Status   SARS Coronavirus 2 NEGATIVE NEGATIVE Final    Comment: (NOTE) SARS-CoV-2 target nucleic acids are NOT DETECTED. The SARS-CoV-2 RNA is generally detectable in upper and lower respiratory specimens during the acute phase of infection. Negative results do not preclude SARS-CoV-2 infection, do not rule out co-infections with other pathogens, and should not be used as the sole basis for treatment or other patient management decisions. Negative results must be combined with clinical observations, patient history, and epidemiological information. The expected result is Negative. Fact Sheet for Patients: SugarRoll.be Fact Sheet for Healthcare Providers: https://www.woods-mathews.com/ This test is not yet approved or cleared by the [place] FDA and  has been authorized for detection and/or diagnosis of SARS-CoV-2 by FDA under an Emergency Use Authorization (EUA). This EUA will remain  in effect (meaning this test can be used) for the duration of the COVID-19 declaration under Section 56 4(b)(1) of the Act, 21 U.S.C. section 360bbb-3(b)(1), unless the authorization is terminated or revoked sooner. Performed at [facility], [place] [address], [place], [place] [address]   Culture, blood (routine x 2) Call MD if unable to obtain prior to antibiotics being given     Status:  Abnormal   Collection Time: 12/24/18  1:42 AM   Specimen: BLOOD  Result Value Ref Range Status   Specimen Description   Final    BLOOD RIGHT FOREARM Performed at [facility], [address], [place], [place] [address]    Special Requests   Final    BOTTLES DRAWN AEROBIC AND ANAEROBIC Blood Culture adequate volume Performed at [facility], [address] [place], [place] [address]    Culture  Setup Time   Final    AEROBIC BOTTLE ONLY GRAM POSITIVE COCCI CRITICAL RESULT CALLED TO, READ BACK BY AND VERIFIED WITH: ROBBINS,J AT 2046 ON 12/24/2018 BY MOSLEY,J GRAM POSITIVE RODS ANAEROBIC BOTTLE ONLY CRITICAL RESULT CALLED TO, READ BACK BY AND VERIFIED WITH: DAVID BESANTI AT [place] ON 12/25/18 RWW    Culture (A)  Final    STAPHYLOCOCCUS SPECIES (COAGULASE NEGATIVE) THE SIGNIFICANCE OF ISOLATING THIS ORGANISM FROM A SINGLE SET OF BLOOD CULTURES WHEN MULTIPLE SETS ARE DRAWN IS UNCERTAIN. PLEASE NOTIFY THE MICROBIOLOGY DEPARTMENT WITHIN ONE WEEK IF SPECIATION AND SENSITIVITIES ARE REQUIRED. DIPHTHEROIDS(CORYNEBACTERIUM SPECIES) Standardized susceptibility testing for this organism is not available. Performed at [facility], [place] [address], [place], [place] [address]    Report Status 12/26/2018 FINAL  Final  Blood Culture ID Panel (Reflexed)     Status: Abnormal   Collection Time: 12/24/18  1:42 AM  Result Value Ref Range Status   Enterococcus species NOT DETECTED NOT DETECTED Final  Listeria monocytogenes NOT DETECTED NOT DETECTED Final   Staphylococcus species DETECTED (A) NOT DETECTED Final    Comment: Methicillin (oxacillin) resistant coagulase negative staphylococcus. Possible blood culture contaminant (unless isolated from more than one blood culture draw or clinical case suggests pathogenicity). No antibiotic treatment is indicated for blood  culture contaminants. CRITICAL RESULT CALLED TO, READ BACK BY AND VERIFIED WITH: ROBBINS,J AT 2046 ON 12/24/2018 BY  MOSLEY,J    Staphylococcus aureus (BCID) NOT DETECTED NOT DETECTED Final   Methicillin resistance DETECTED (A) NOT DETECTED Final    Comment: CRITICAL RESULT CALLED TO, READ BACK BY AND VERIFIED WITH: ROBBINS,J AT 2046 ON 12/24/2018 BY MOSLEY,J    Streptococcus species NOT DETECTED NOT DETECTED Final   Streptococcus agalactiae NOT DETECTED NOT DETECTED Final   Streptococcus pneumoniae NOT DETECTED NOT DETECTED Final   Streptococcus pyogenes NOT DETECTED NOT DETECTED Final   Acinetobacter baumannii NOT DETECTED NOT DETECTED Final   Enterobacteriaceae species NOT DETECTED NOT DETECTED Final   Enterobacter cloacae complex NOT DETECTED NOT DETECTED Final   Escherichia coli NOT DETECTED NOT DETECTED Final   Klebsiella oxytoca NOT DETECTED NOT DETECTED Final   Klebsiella pneumoniae NOT DETECTED NOT DETECTED Final   Proteus species NOT DETECTED NOT DETECTED Final   Serratia marcescens NOT DETECTED NOT DETECTED Final   Haemophilus influenzae NOT DETECTED NOT DETECTED Final   Neisseria meningitidis NOT DETECTED NOT DETECTED Final   Pseudomonas aeruginosa NOT DETECTED NOT DETECTED Final   Candida albicans NOT DETECTED NOT DETECTED Final   Candida glabrata NOT DETECTED NOT DETECTED Final   Candida krusei NOT DETECTED NOT DETECTED Final   Candida parapsilosis NOT DETECTED NOT DETECTED Final   Candida tropicalis NOT DETECTED NOT DETECTED Final    Comment: Performed at [facility], [address] [place], [place] [address]  Culture, blood (routine x 2)     Status: None   Collection Time: 12/25/18  9:25 AM   Specimen: BLOOD  Result Value Ref Range Status   Specimen Description BLOOD RIGHT ANTECUBITAL  Final   Special Requests   Final    BOTTLES DRAWN AEROBIC AND ANAEROBIC Blood Culture results may not be optimal due to an inadequate volume of blood received in culture bottles   Culture   Final    NO GROWTH 5 DAYS Performed at [facility], [address] [place],  [place] [address]    Report Status 12/30/2018 FINAL  Final  Culture, blood (routine x 2)     Status: None   Collection Time: 12/25/18  9:34 AM   Specimen: BLOOD  Result Value Ref Range Status   Specimen Description BLOOD BLOOD RIGHT FOREARM  Final   Special Requests   Final    BOTTLES DRAWN AEROBIC AND ANAEROBIC Blood Culture adequate volume   Culture   Final    NO GROWTH 5 DAYS Performed at [facility], [address] [place], [place] [address]    Report Status 12/30/2018 FINAL  Final  MRSA PCR Screening     Status: None   Collection Time: 12/28/18  6:26 PM   Specimen: Nasal Mucosa; Nasopharyngeal  Result Value Ref Range Status   MRSA by PCR NEGATIVE NEGATIVE Final    Comment:        The GeneXpert MRSA Assay (FDA approved for NASAL specimens only), is one component of a comprehensive MRSA colonization surveillance program. It is not intended to diagnose MRSA infection nor to guide or monitor treatment for MRSA infections. Performed at [facility], [address], [place], [place] [address]     Best Practice/Protocols:  VTE Prophylaxis: Heparin (drip) GI Prophylaxis: Antihistamine    CULTURES: Blood x 2 11/8>>negative  Blood 11/8>>1/4 GPC staph species Mec A +, now another 1/4 (2/4 total) GPR  MRSA 11/11>>negative    Studies: Dg Chest 2 View  Result Date: 12/23/2018 CLINICAL DATA:  Shortness of breath EXAM: CHEST - 2 VIEW COMPARISON:  February 06, 2018 and December 23, 2015 FINDINGS: There is atelectatic change in the right base region. There is mild stable interstitial thickening. There is no frank edema or consolidation. The heart is upper normal in size with pulmonary vascularity normal. No adenopathy. There is degenerative change in the thoracic spine. IMPRESSION: Stable interstitial thickening, likely representing a degree of chronic inflammatory change/underlying fibrosis. Mild atelectasis right base. No edema or consolidation. Stable cardiac  silhouette. No evident adenopathy. Electronically Signed   By: Lowella Grip III M.D.   On: 12/23/2018 14:42   Ct Angio Chest Pe W And/or Wo Contrast  Result Date: 12/23/2018 CLINICAL DATA:  Shortness of breath with concern for PE. EXAM: CT ANGIOGRAPHY CHEST WITH CONTRAST TECHNIQUE: Multidetector CT imaging of the chest was performed using the standard protocol during bolus administration of intravenous contrast. Multiplanar CT image reconstructions and MIPs were obtained to evaluate the vascular anatomy. CONTRAST:  14mL OMNIPAQUE IOHEXOL 350 MG/ML SOLN COMPARISON:  None. FINDINGS: Cardiovascular: Evaluation for pulmonary emboli is limited by motion artifact. Given this limitation, no PE was identified. The main pulmonary artery is dilated measuring approximately 3.4 cm in diameter. There is no CT evidence of acute right heart strain. There are atherosclerotic changes of the thoracic aorta. The descending aorta is ectatic measuring up to approximately 3.6 cm in diameter. No evidence for an aneurysm. Heart size is enlarged. Coronary artery calcifications are noted. Mediastinum/Nodes: --No mediastinal or hilar lymphadenopathy. --No axillary lymphadenopathy. --No supraclavicular lymphadenopathy. --Normal thyroid gland. --The esophagus is unremarkable Lungs/Pleura: Emphysematous changes are noted bilaterally. There is no pneumothorax. No large pleural effusion. No focal infiltrate. Upper Abdomen: There is cholelithiasis without CT evidence for acute cholecystitis. There is a moderate-sized hiatal hernia. Musculoskeletal: No chest wall abnormality. No acute or significant osseous findings. Review of the MIP images confirms the above findings. IMPRESSION: 1. Evaluation for pulmonary emboli is limited by motion artifact. Given this limitation, no PE was identified. 2. Cardiomegaly with coronary artery disease. 3. Moderate-sized hiatal hernia. 4. Cholelithiasis without CT evidence for acute cholecystitis. 5.  Emphysema and aortic atherosclerosis. Aortic Atherosclerosis (ICD10-I70.0) and Emphysema (ICD10-J43.9). Electronically Signed   By: Constance Holster M.D.   On: 12/23/2018 19:59   Dg Chest Port 1 View  Result Date: 12/31/2018 CLINICAL DATA:  Acute respiratory failure. EXAM: PORTABLE CHEST 1 VIEW COMPARISON:  12/23/2018 FINDINGS: Cardiac enlargement. Aortic atherosclerosis. Chronic interstitial changes of emphysema. No pleural effusion or edema. No superimposed airspace consolidation. IMPRESSION: 1. Stable cardiac enlargement and aortic atherosclerosis. 2. Emphysema Electronically Signed   By: Kerby Moors M.D.   On: 12/31/2018 05:51   Dg Chest Port 1 View  Result Date: 12/28/2018 CLINICAL DATA:  Dyspnea, COPD EXAM: PORTABLE CHEST 1 VIEW COMPARISON:  12/24/2018 chest radiograph. FINDINGS: Stable cardiomediastinal silhouette with mild cardiomegaly. No pneumothorax. No pleural effusion. Emphysema. No overt pulmonary edema. No acute consolidative airspace disease. IMPRESSION: Stable mild cardiomegaly without overt pulmonary edema. No acute pulmonary disease. Emphysema. Electronically Signed   By: Ilona Sorrel M.D.   On: 12/28/2018 17:32   Dg Chest Port 1  View  Result Date: 12/24/2018 CLINICAL DATA:  Cough, SOB, weakness. Hx of wheezing, HTN, COPD, a-fib. Former smoker. EXAM: PORTABLE CHEST 1 VIEW COMPARISON:  Chest radiograph 02/06/2018 FINDINGS: Stable cardiomediastinal contours. Mildly enlarged heart size. Bilateral diffuse coarse interstitial opacities with basilar predominance likely representing chronic lung disease. No new focal opacity. No pneumothorax or large pleural effusion. No acute finding in the visualized skeleton. IMPRESSION: Stable chest x-ray with chronic coarse interstitial opacities, basilar predominance. No acute cardiopulmonary findings. Electronically Signed   By: Audie Pinto M.D.   On: 12/24/2018 15:04      Subjective:    Overnight Issues: Quiet Precedex overnight.   Was finally able to sleep at 2 AM.  She has not slept "in days".  Patient uses Ativan as an outpatient.  Note that she is also on BuSpar.   Objective:  Vital signs for last 24 hours: Temp:  [97.5 F (36.4 C)-98.5 F (36.9 C)] 98.5 F (36.9 C) (11/15 0800) Pulse Rate:  [45-103] 69 (11/15 0800) Resp:  [19-30] 30 (11/15 0800) BP: (109-140)/(66-112) 124/86 (11/15 0805) SpO2:  [42 %-100 %] 42 % (11/15 0800)  Hemodynamic parameters for last 24 hours:    Intake/Output from previous day: 11/14 0701 - 11/15 0700 In: 886.6 [P.O.:300; I.V.:536.6; IV Piggyback:50] Out: 2975 [Urine:2975]  Intake/Output this shift: Total I/O In: 996.5 [IV Piggyback:996.5] Out: 18 [Urine:60]  Vent settings for last 24 hours:    Physical Exam:  General:  Chronically ill appearing female, currently on nasal cannula at 4 L/min, lethargic Neuro:  Lethargic, not following commands, PERRL  HEENT: No JVD present  Cardiovascular: irregular irregular, no R/G  Lungs: diminished throughout, no wheezes, poor air movement bilaterally Abdomen: +BS x4, obese, soft, non tender, non distended  Musculoskeletal: normal bulk and tone, trace generalized edema  Skin: diffuse scattered ecchymosis, bilateral upper extremity skin tears   Assessment/Plan:    Acute on chronic hypoxic and hypercarbic respiratory failure secondary AECOPD  Hx: Cough, Chronic Home O2 @2L , and Orthopnea  Cor pulmonale/pulmonary hypertension On RA with spO2 99 requiring sedation Scheduled and PRN  bronchodilator therapy  stop doxycycline  CXR with pulmonary interstitial inflitrates - lasix 40 daily Nebulized steroids  Stop precedex,  Transition to po regimen -completed 5 days of rocephin, 1 day of vancomycin, and now 6 days of doxycycline -will stop anbibiotics now  Paroxysmal atrial fibrillation  Hx: HTN, Stroke, and HLD Continuous telemetry monitoring  PRN IV metoprolol for heart rate control  Continue outpatient cardiac medications once  pt able to tolerate po  Cardiology following-appreciate input  -will try to transition cardizem and heparin to PO eliquis and po CCB  Leukocytosis: 2/4 Blood Cultures positive for staph species Mec A possible contaminant  Trend WBC and monitor fever curve Trend PCT Follow cultures  Abx :stopped  Hyperglycemia  CBG's q4hrs  SSI   Acute encephalopathy pt with underlying dementia  Frequent reorientation  Precedex gtt- stopped , mentation improved.  PT/OT today  PRN po home dose ativan - detrimental for delerium, discussed with Dr Earleen Newport started seroquel      Critical care provider statement:    Critical care time (minutes):  32   Critical care time was exclusive of:  Separately billable procedures and  treating other patients   Critical care was necessary to treat or prevent imminent or  life-threatening deterioration of the following conditions:  Acute copd exacerbation, paroxysmal atrial fibrillation, pulmonary arterial hypertension, mobid obesity, multiple comorbid conditions   Critical care was time spent  personally by me on the following  activities:  Development of treatment plan with patient or surrogate,  discussions with consultants, evaluation of patient's response to  treatment, examination of patient, obtaining history from patient or  surrogate, ordering and performing treatments and interventions, ordering  and review of laboratory studies and re-evaluation of patient's condition   I assumed direction of critical care for this patient from another  provider in my specialty: no

## 2019-01-01 NOTE — Progress Notes (Signed)
Pt oriented to self and place only. She has been talkative this morning was repositioned and  able to sit up in bed to have breakfast. Required full assistance with eating.  Will continue to monitor.

## 2019-01-01 NOTE — Progress Notes (Signed)
Patient ID: Summer Bradley, female   DOB: [birth year], 73 y.o.   MRN: [id number] [facility] PROGRESS NOTE  Wajeeha Alsbury [id number] DOB: 03/15/1945 DOA: 12/23/2018 PCP: [facility]  HPI/Subjective: The patient was off Precedex drip.  She did eat a little bit for breakfast.  She answered a few questions.  She was able to follow some commands.  Objective: Vitals:   01/01/19 0805 01/01/19 1500  BP: 124/86 (!) 116/93  Pulse:  (!) 103  Resp:  (!) 32  Temp:    SpO2:  100%    Filed Weights   12/27/18 0606 12/28/18 0302 12/28/18 1819  Weight: 96.3 kg 94.8 kg 95.4 kg    ROS: Review of Systems  Unable to perform ROS: Acuity of condition  Respiratory: Negative for cough and shortness of breath.   Gastrointestinal: Negative for abdominal pain.  Musculoskeletal: Negative for joint pain.   Exam: Physical Exam  HENT:  Nose: No mucosal edema.  Mouth/Throat: No oropharyngeal exudate or posterior oropharyngeal edema.  Eyes: Pupils are equal, round, and reactive to light. Conjunctivae, EOM and lids are normal.  Neck: No JVD present. Carotid bruit is not present. No edema present. No thyroid mass and no thyromegaly present.  Cardiovascular: S1 normal and S2 normal. An irregularly irregular rhythm present. Exam reveals no gallop.  No murmur heard. Pulses:      Dorsalis pedis pulses are 2+ on the right side and 2+ on the left side.  Respiratory: No respiratory distress. She has decreased breath sounds in the right lower field and the left lower field. She has no wheezes. She has no rhonchi. She has no rales.  GI: Soft. Bowel sounds are normal. There is no abdominal tenderness.  Musculoskeletal:     Right ankle: She exhibits swelling.     Left ankle: She exhibits swelling.  Lymphadenopathy:    She has no cervical adenopathy.  Neurological: She is alert.  Patient able to follow commands.  Skin: Skin is warm. Nails show no clubbing.  Bruising bilateral  arms and legs.  Psychiatric:  Patient is now answering questions and following commands.       Data Reviewed: Basic Metabolic Panel: Recent Labs  Lab 12/28/18 0607 12/29/18 0520 12/30/18 0450 12/31/18 0652 12/31/18 0653 01/01/19 0152  NA 144 145 148*  --  151* 153*  K 4.2 3.4* 3.8  --  3.8 3.3*  CL 106 104 109  --  115* 112*  CO2 26 30 29   --  26 32  GLUCOSE 189* 221* 123*  --  110* 100*  BUN 42* 44* 42*  --  40* 34*  CREATININE 1.01* 1.07* 0.94  --  0.81 0.91  CALCIUM 9.9 9.2 9.0  --  8.8* 8.8*  MG  --  2.5* 2.8* 2.9*  --  2.8*  PHOS  --  3.8  --   --  2.9 3.0    CBC: Recent Labs  Lab 12/26/18 0439 12/27/18 0506 12/28/18 0607 12/29/18 0520 12/30/18 0450 12/31/18 0652 01/01/19 0152  WBC 19.5* 13.6* 19.2* 13.6* 17.6* 12.6* 14.7*  NEUTROABS 16.7* 12.6* 17.5* 12.3*  --   --   --   HGB 14.6 15.0 15.2* 14.7 14.8 15.4* 15.8*  HCT 45.4 47.7* 45.6 45.2 45.1 48.2* 49.7*  MCV 96.4 97.5 93.1 94.0 94.4 96.0 95.6  PLT 296 289 311 253 253 224 209   BNP (last 3 results) Recent Labs    02/03/18 0611 12/23/18 1340 12/29/18 0521  BNP 183.0*  269.0* 518.0*     CBG: Recent Labs  Lab 12/31/18 1926 01/01/19 0004 01/01/19 0417 01/01/19 0723 01/01/19 1302  GLUCAP 102* 116* 86 146* 78    Recent Results (from the past 240 hour(s))  SARS CORONAVIRUS 2 (TAT 6-24 HRS) Nasopharyngeal Nasopharyngeal Swab     Status: None   Collection Time: 12/23/18 10:30 PM   Specimen: Nasopharyngeal Swab  Result Value Ref Range Status   SARS Coronavirus 2 NEGATIVE NEGATIVE Final    Comment: (NOTE) SARS-CoV-2 target nucleic acids are NOT DETECTED. The SARS-CoV-2 RNA is generally detectable in upper and lower respiratory specimens during the acute phase of infection. Negative results do not preclude SARS-CoV-2 infection, do not rule out co-infections with other pathogens, and should not be used as the sole basis for treatment or other patient management decisions. Negative results must  be combined with clinical observations, patient history, and epidemiological information. The expected result is Negative. Fact Sheet for Patients: SugarRoll.be Fact Sheet for Healthcare Providers: https://www.woods-mathews.com/ This test is not yet approved or cleared by the [place] FDA and  has been authorized for detection and/or diagnosis of SARS-CoV-2 by FDA under an Emergency Use Authorization (EUA). This EUA will remain  in effect (meaning this test can be used) for the duration of the COVID-19 declaration under Section 56 4(b)(1) of the Act, 21 U.S.C. section 360bbb-3(b)(1), unless the authorization is terminated or revoked sooner. Performed at [facility], [place] [address] [place], [place] [address]   Culture, blood (routine x 2) Call MD if unable to obtain prior to antibiotics being given     Status: Abnormal   Collection Time: 12/24/18  1:42 AM   Specimen: BLOOD  Result Value Ref Range Status   Specimen Description   Final    BLOOD RIGHT FOREARM Performed at [facility], [address], [place], [place] [address]    Special Requests   Final    BOTTLES DRAWN AEROBIC AND ANAEROBIC Blood Culture adequate volume Performed at [facility], [address] [place], [place] [address]    Culture  Setup Time   Final    AEROBIC BOTTLE ONLY GRAM POSITIVE COCCI CRITICAL RESULT CALLED TO, READ BACK BY AND VERIFIED WITH: ROBBINS,J AT 2046 ON 12/24/2018 BY MOSLEY,J GRAM POSITIVE RODS ANAEROBIC BOTTLE ONLY CRITICAL RESULT CALLED TO, READ BACK BY AND VERIFIED WITH: DAVID BESANTI AT [place] ON 12/25/18 RWW    Culture (A)  Final    STAPHYLOCOCCUS SPECIES (COAGULASE NEGATIVE) THE SIGNIFICANCE OF ISOLATING THIS ORGANISM FROM A SINGLE SET OF BLOOD CULTURES WHEN MULTIPLE SETS ARE DRAWN IS UNCERTAIN. PLEASE NOTIFY THE MICROBIOLOGY DEPARTMENT WITHIN ONE WEEK IF SPECIATION AND SENSITIVITIES ARE  REQUIRED. DIPHTHEROIDS(CORYNEBACTERIUM SPECIES) Standardized susceptibility testing for this organism is not available. Performed at [facility], [place] [address] [place], [place] [address]    Report Status 12/26/2018 FINAL  Final  Blood Culture ID Panel (Reflexed)     Status: Abnormal   Collection Time: 12/24/18  1:42 AM  Result Value Ref Range Status   Enterococcus species NOT DETECTED NOT DETECTED Final   Listeria monocytogenes NOT DETECTED NOT DETECTED Final   Staphylococcus species DETECTED (A) NOT DETECTED Final    Comment: Methicillin (oxacillin) resistant coagulase negative staphylococcus. Possible blood culture contaminant (unless isolated from more than one blood culture draw or clinical case suggests pathogenicity). No antibiotic treatment is indicated for blood  culture contaminants. CRITICAL RESULT CALLED TO, READ BACK BY AND VERIFIED WITH: ROBBINS,J AT 2046 ON  12/24/2018 BY MOSLEY,J    Staphylococcus aureus (BCID) NOT DETECTED NOT DETECTED Final   Methicillin resistance DETECTED (A) NOT DETECTED Final    Comment: CRITICAL RESULT CALLED TO, READ BACK BY AND VERIFIED WITH: ROBBINS,J AT 2046 ON 12/24/2018 BY MOSLEY,J    Streptococcus species NOT DETECTED NOT DETECTED Final   Streptococcus agalactiae NOT DETECTED NOT DETECTED Final   Streptococcus pneumoniae NOT DETECTED NOT DETECTED Final   Streptococcus pyogenes NOT DETECTED NOT DETECTED Final   Acinetobacter baumannii NOT DETECTED NOT DETECTED Final   Enterobacteriaceae species NOT DETECTED NOT DETECTED Final   Enterobacter cloacae complex NOT DETECTED NOT DETECTED Final   Escherichia coli NOT DETECTED NOT DETECTED Final   Klebsiella oxytoca NOT DETECTED NOT DETECTED Final   Klebsiella pneumoniae NOT DETECTED NOT DETECTED Final   Proteus species NOT DETECTED NOT DETECTED Final   Serratia marcescens NOT DETECTED NOT DETECTED Final   Haemophilus influenzae NOT DETECTED NOT DETECTED Final   Neisseria meningitidis NOT  DETECTED NOT DETECTED Final   Pseudomonas aeruginosa NOT DETECTED NOT DETECTED Final   Candida albicans NOT DETECTED NOT DETECTED Final   Candida glabrata NOT DETECTED NOT DETECTED Final   Candida krusei NOT DETECTED NOT DETECTED Final   Candida parapsilosis NOT DETECTED NOT DETECTED Final   Candida tropicalis NOT DETECTED NOT DETECTED Final    Comment: Performed at [facility], [address] [place], [place] [address]  Culture, blood (routine x 2)     Status: None   Collection Time: 12/25/18  9:25 AM   Specimen: BLOOD  Result Value Ref Range Status   Specimen Description BLOOD RIGHT ANTECUBITAL  Final   Special Requests   Final    BOTTLES DRAWN AEROBIC AND ANAEROBIC Blood Culture results may not be optimal due to an inadequate volume of blood received in culture bottles   Culture   Final    NO GROWTH 5 DAYS Performed at [facility], [address] [place], [place] [address]    Report Status 12/30/2018 FINAL  Final  Culture, blood (routine x 2)     Status: None   Collection Time: 12/25/18  9:34 AM   Specimen: BLOOD  Result Value Ref Range Status   Specimen Description BLOOD BLOOD RIGHT FOREARM  Final   Special Requests   Final    BOTTLES DRAWN AEROBIC AND ANAEROBIC Blood Culture adequate volume   Culture   Final    NO GROWTH 5 DAYS Performed at [facility], [address] [place], [place] [address]    Report Status 12/30/2018 FINAL  Final  MRSA PCR Screening     Status: None   Collection Time: 12/28/18  6:26 PM   Specimen: Nasal Mucosa; Nasopharyngeal  Result Value Ref Range Status   MRSA by PCR NEGATIVE NEGATIVE Final    Comment:        The GeneXpert MRSA Assay (FDA approved for NASAL specimens only), is one component of a comprehensive MRSA colonization surveillance program. It is not intended to diagnose MRSA infection nor to guide or monitor treatment for MRSA infections. Performed at [facility], [address]  [place], [place] [address]       Scheduled Meds: . apixaban  5 mg Oral BID  . bisoprolol  5 mg Oral BID  . budesonide (PULMICORT) nebulizer solution  0.5 mg Nebulization BID  . calcium-vitamin D  1 tablet Oral BID  . Chlorhexidine Gluconate Cloth  6 each Topical Daily  . citalopram  20 mg  Oral QHS  . cloNIDine  0.3 mg Transdermal Weekly  . diltiazem  240 mg Oral Daily  . furosemide  40 mg Intravenous Daily  . guaiFENesin  600 mg Oral BID  . insulin aspart  0-20 Units Subcutaneous Q4H  . ipratropium-albuterol  3 mL Nebulization Q6H  . levothyroxine  150 mcg Oral QAC breakfast  . LORazepam  0.5 mg Oral TID  . mouth rinse  15 mL Mouth Rinse BID  . nystatin  5 mL Oral QID  . pravastatin  20 mg Oral q1800  . QUEtiapine  25 mg Oral QHS  . sodium chloride flush  3 mL Intravenous Q12H   Continuous Infusions: . sodium chloride Stopped (12/27/18 2247)  . famotidine (PEPCID) IV 20 mg (01/01/19 [id number])    Assessment/Plan:  1. Acute on chronic hypoxic respiratory failure.  Patient was transferred over to the ICU initially for BiPAP but now currently on nasal cannula.  Patient seems to be breathing better and not complaining of shortness of breath.  Continue nebulizer treatments. 2. Acute delirium with underlying dementia.  The patient has been on off Precedex drip since yesterday.  Continue Seroquel 25 mg nightly.  Patient now taking oral medications so can take her usual BuSpar, Celexa and oral Ativan.   3. COPD exacerbation.  Continue nebulizers and inhalers.  Patient completed antibiotics and steroids. 4. Paroxysmal atrial fibrillation with rapid ventricular response.  Patient to be converted off heparin drip and back onto her Eliquis today.  Patient to be converted off Cardizem drip to oral Cardizem and oral bisoprolol today. 5. Hypertension on oral bisoprolol and Cardizem. 6. Hypothyroidism unspecified on Synthroid 7. 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 8. Anxiety and  depression on Celexa, BuSpar and Ativan  Code Status:     Code Status Orders  (From admission, onward)         Start     Ordered   12/23/18 2226  Full code  Continuous     12/23/18 2233        Code Status History    Date Active Date Inactive Code Status Order ID Comments User Context   02/03/2018 0934 02/10/2018 1954 Full Code [id number]  Arta Silence, MD Inpatient   02/02/2014 0933 03/05/2014 1624 Full Code [id number]  Wilhelmina Mcardle, MD Inpatient   02/01/2014 1236 02/02/2014 0933 Full Code [id number]  Rob Hickman, MD Inpatient   01/29/2014 1119 02/01/2014 1236 Full Code [id number]  Roland Rack, MD Inpatient   Advance Care Planning Activity    Advance Directive Documentation     Most Recent Value  Type of Advance Directive  Healthcare Power of Attorney  Pre-existing out of facility DNR order (yellow form or pink MOST form)  -  "MOST" Form in Place?  -     Family Communication: Spoke with daughter on the phone Disposition Plan: Unclear at this point  Time spent: 26 minutes.  Case discussed with nursing staff and critical care specialist.  Loletha Grayer  [facility]

## 2019-01-01 NOTE — Consult Note (Signed)
ANTICOAGULATION CONSULT NOTE  Pharmacy Consult for apixaban Indication: atrial fibrillation  Patient Measurements: Height: 5\' 3"  (160 cm) Weight: 210 lb 5.1 oz (95.4 kg) IBW/kg (Calculated) : 52.4 Heparin Dosing Weight: 74.5 kg  Vital Signs: Temp: 98.5 F (36.9 C) (11/15 0800) Temp Source: Axillary (11/15 0800) BP: 124/86 (11/15 0805) Pulse Rate: 69 (11/15 0800)  Labs: Recent Labs    12/29/18 1511 12/29/18 2344  12/30/18 0450  12/31/18 0652 12/31/18 0653 12/31/18 1757 01/01/19 0152  HGB  --   --    < > 14.8  --  15.4*  --   --  15.8*  HCT  --   --   --  45.1  --  48.2*  --   --  49.7*  PLT  --   --   --  253  --  224  --   --  209  APTT 29 >160*  --   --    < > 65*  --  61* 84*  LABPROT 16.4*  --   --   --   --   --   --   --   --   INR 1.3*  --   --   --   --   --   --   --   --   HEPARINUNFRC 2.54* 1.54*  --   --   --  1.05*  --   --  0.83*  CREATININE  --   --   --  0.94  --   --  0.81  --  0.91   < > = values in this interval not displayed.    Estimated Creatinine Clearance: 59.6 mL/min (by C-G formula based on SCr of 0.91 mg/dL).    Assessment: 73 y.o. female  admitted with acute on chronic hypoxic and hypercarbic respiratory failure secondary to AECOPD requiring BiPAP  Chest x-ray negative for pneumonia or fluid. Transferred to ICU stepdown for BiPAP 11/11. Prior to this note she has been on apixaban. However, she is unable to swallow oral medications. Her last dose of apixaban was 12/28/18 at 0804 am. H&H, PLT currently wnl  Patient has been on heparin drip and will now transition back to PTA apixaban.  Goal of Therapy:  Monitor platelets by anticoagulation protocol: Yes   Plan:  Start apixaban 5 mg BID with first dose today ~ 1200. Heparin drip to stop at same time.  Tawnya Crook, PharmD Clinical Pharmacist 01/01/2019 11:22 AM

## 2019-01-02 LAB — GLUCOSE, CAPILLARY
Glucose-Capillary: 110 mg/dL — ABNORMAL HIGH (ref 70–99)
Glucose-Capillary: 119 mg/dL — ABNORMAL HIGH (ref 70–99)
Glucose-Capillary: 138 mg/dL — ABNORMAL HIGH (ref 70–99)
Glucose-Capillary: 139 mg/dL — ABNORMAL HIGH (ref 70–99)
Glucose-Capillary: 142 mg/dL — ABNORMAL HIGH (ref 70–99)
Glucose-Capillary: 175 mg/dL — ABNORMAL HIGH (ref 70–99)

## 2019-01-02 LAB — CBC
HCT: 49.5 % — ABNORMAL HIGH (ref 36.0–46.0)
Hemoglobin: 16.2 g/dL — ABNORMAL HIGH (ref 12.0–15.0)
MCH: 31.3 pg (ref 26.0–34.0)
MCHC: 32.7 g/dL (ref 30.0–36.0)
MCV: 95.7 fL (ref 80.0–100.0)
Platelets: 198 10*3/uL (ref 150–400)
RBC: 5.17 MIL/uL — ABNORMAL HIGH (ref 3.87–5.11)
RDW: 14.7 % (ref 11.5–15.5)
WBC: 14.5 10*3/uL — ABNORMAL HIGH (ref 4.0–10.5)
nRBC: 0.2 % (ref 0.0–0.2)

## 2019-01-02 MED ORDER — FAMOTIDINE 20 MG PO TABS
20.0000 mg | ORAL_TABLET | Freq: Two times a day (BID) | ORAL | Status: DC
  Administered 2019-01-02 – 2019-01-03 (×3): 20 mg via ORAL
  Filled 2019-01-02 (×3): qty 1

## 2019-01-02 NOTE — Progress Notes (Signed)
PHARMACIST - PHYSICIAN COMMUNICATION  CONCERNING: IV to Oral Route Change Policy  RECOMMENDATION: This patient is receiving famotidine by the intravenous route.  Based on criteria approved by the Pharmacy and Therapeutics Committee, the intravenous medication(s) is/are being converted to the equivalent oral dose form(s).   DESCRIPTION: These criteria include:  The patient is eating (either orally or via tube) and/or has been taking other orally administered medications for a least 24 hours  The patient has no evidence of active gastrointestinal bleeding or impaired GI absorption (gastrectomy, short bowel, patient on TNA or NPO).  If you have questions about this conversion, please contact the Pharmacy Department at [phone].   Valon Glasscock L, [facility] 01/02/2019 9:09 AM

## 2019-01-02 NOTE — Progress Notes (Signed)
Ch f/u with pt during rounds. Pt is a/o but still presents to be highly anxious and restless. Ch is aware that the pt has a hx of anxiety as reported by pt's dau. Ch was present when pt requested water. Pt has to take water w/ thicken so ch informed the nurse of pt's request. Ch will f/u with pt's family as pt will be moved to 2A as of 11.16.20.    01/02/19 1100  Clinical Encounter Type  Visited With Patient;Health care provider  Visit Type Follow-up;Psychological support;Social support  Stress Factors  Patient Stress Factors Health changes;Loss of control;Major life changes  Family Stress Factors Major life changes

## 2019-01-02 NOTE — Progress Notes (Signed)
Pharmacy Electrolyte Monitoring Consult:  Summer Bradley is a 24 YOF admitted with acute on chronic hypoxic and hypercarbic respiratory failure secontary to AECOPD requiring BiPAP. She has very severe COPD (stage IV) and has deteriorated due to delirium.  Pharmacy has been consulted to assist in monitoring and replacing electrolytes.  Patient is on thickened liquids. She is receiving Lasix 40mg  IV daily. No MIVF.   Labs:  Sodium (mmol/L)  Date Value  01/01/2019 153 (H)  01/14/2014 138   Potassium (mmol/L)  Date Value  01/01/2019 3.3 (L)  01/14/2014 5.0   Magnesium (mg/dL)  Date Value  01/01/2019 2.8 (H)  09/22/2013 1.8   Phosphorus (mg/dL)  Date Value  01/01/2019 3.0   Calcium (mg/dL)  Date Value  01/01/2019 8.8 (L)   Calcium, Total (mg/dL)  Date Value  01/14/2014 8.8   Albumin (g/dL)  Date Value  01/01/2019 3.3 (L)  01/10/2014 3.1 (L)   Corrected Ca: 9.4 mg/dL   Assessment/Plan: - Potassium chloride 40 mEq PO x 1. - No other electrolytes warranting supplementation at this time. - Will check BMP + Mg with AM labs and adjusted per consult.   Goals of Therapy: - K ~ 4 - Mg ~ 2  - All other electrolytes WNL  Thank you for allowing pharmacy to be a part of this patient's care.   Raiford Simmonds, PharmD Candidate 01/02/2019 11:45 AM

## 2019-01-02 NOTE — Progress Notes (Signed)
Patient ID: Reggina Ostrander, female   DOB: 1945-09-14, 73 y.o.   MRN: [id number] [facility] PROGRESS NOTE  Divya Neighbor [id number] DOB: October 28, 1945 DOA: 12/23/2018 PCP: [place]  HPI/Subjective: Patient was yelling out the room "water".  I came in the room and gave her a few sips of water.  She did well with that.  She wanted more to drink.  I gave her the thickened apple juice that was at the bedside.  She drank most of that with me.  She was still thirsty.  She felt okay.  Breathing is better.  Objective: Vitals:   01/02/19 1158 01/02/19 1219  BP:  (!) 136/97  Pulse: 80 84  Resp: (!) 23 19  Temp:  98.5 F (36.9 C)  SpO2: 96% 97%    Filed Weights   12/27/18 0606 12/28/18 0302 12/28/18 1819  Weight: 96.3 kg 94.8 kg 95.4 kg    ROS: Review of Systems  Unable to perform ROS: Acuity of condition  Constitutional: Negative for malaise/fatigue.  Respiratory: Negative for cough and shortness of breath.   Cardiovascular: Negative for chest pain.  Gastrointestinal: Negative for abdominal pain.  Musculoskeletal: Negative for joint pain.   Exam: Physical Exam  HENT:  Nose: No mucosal edema.  Mouth/Throat: No oropharyngeal exudate or posterior oropharyngeal edema.  Eyes: Pupils are equal, round, and reactive to light. Conjunctivae, EOM and lids are normal.  Neck: No JVD present. Carotid bruit is not present. No edema present. No thyroid mass and no thyromegaly present.  Cardiovascular: S1 normal and S2 normal. An irregularly irregular rhythm present. Exam reveals no gallop.  No murmur heard. Pulses:      Dorsalis pedis pulses are 2+ on the right side and 2+ on the left side.  Respiratory: No respiratory distress. She has decreased breath sounds in the right lower field and the left lower field. She has no wheezes. She has no rhonchi. She has no rales.  GI: Soft. Bowel sounds are normal. There is no abdominal tenderness.  Musculoskeletal:      Right ankle: She exhibits swelling.     Left ankle: She exhibits swelling.  Lymphadenopathy:    She has no cervical adenopathy.  Neurological: She is alert.  Patient able to follow commands.  Skin: Skin is warm. Nails show no clubbing.  Bruising bilateral arms and legs.  Psychiatric:  Patient is now answering questions and following commands.       Data Reviewed: Basic Metabolic Panel: Recent Labs  Lab 12/28/18 0607 12/29/18 0520 12/30/18 0450 12/31/18 0652 12/31/18 0653 01/01/19 0152  NA 144 145 148*  --  151* 153*  K 4.2 3.4* 3.8  --  3.8 3.3*  CL 106 104 109  --  115* 112*  CO2 26 30 29   --  26 32  GLUCOSE 189* 221* 123*  --  110* 100*  BUN 42* 44* 42*  --  40* 34*  CREATININE 1.01* 1.07* 0.94  --  0.81 0.91  CALCIUM 9.9 9.2 9.0  --  8.8* 8.8*  MG  --  2.5* 2.8* 2.9*  --  2.8*  PHOS  --  3.8  --   --  2.9 3.0    CBC: Recent Labs  Lab 12/27/18 0506 12/28/18 0607 12/29/18 0520 12/30/18 0450 12/31/18 0652 01/01/19 0152 01/02/19 0448  WBC 13.6* 19.2* 13.6* 17.6* 12.6* 14.7* 14.5*  NEUTROABS 12.6* 17.5* 12.3*  --   --   --   --   HGB 15.0  15.2* 14.7 14.8 15.4* 15.8* 16.2*  HCT 47.7* 45.6 45.2 45.1 48.2* 49.7* 49.5*  MCV 97.5 93.1 94.0 94.4 96.0 95.6 95.7  PLT 289 311 253 253 224 209 198   BNP (last 3 results) Recent Labs    02/03/18 0611 12/23/18 1340 12/29/18 0521  BNP 183.0* 269.0* 518.0*     CBG: Recent Labs  Lab 01/01/19 1928 01/01/19 2339 01/02/19 0419 01/02/19 0716 01/02/19 1233  GLUCAP 122* 107* 110* 119* 175*    Recent Results (from the past 240 hour(s))  SARS CORONAVIRUS 2 (TAT 6-24 HRS) Nasopharyngeal Nasopharyngeal Swab     Status: None   Collection Time: 12/23/18 10:30 PM   Specimen: Nasopharyngeal Swab  Result Value Ref Range Status   SARS Coronavirus 2 NEGATIVE NEGATIVE Final    Comment: (NOTE) SARS-CoV-2 target nucleic acids are NOT DETECTED. The SARS-CoV-2 RNA is generally detectable in upper and lower respiratory  specimens during the acute phase of infection. Negative results do not preclude SARS-CoV-2 infection, do not rule out co-infections with other pathogens, and should not be used as the sole basis for treatment or other patient management decisions. Negative results must be combined with clinical observations, patient history, and epidemiological information. The expected result is Negative. Fact Sheet for Patients: SugarRoll.be Fact Sheet for Healthcare Providers: https://www.woods-mathews.com/ This test is not yet approved or cleared by the [place] FDA and  has been authorized for detection and/or diagnosis of SARS-CoV-2 by FDA under an Emergency Use Authorization (EUA). This EUA will remain  in effect (meaning this test can be used) for the duration of the COVID-19 declaration under Section 56 4(b)(1) of the Act, 21 U.S.C. section 360bbb-3(b)(1), unless the authorization is terminated or revoked sooner. Performed at [facility], [place] [address] [place], [place] [address]   Culture, blood (routine x 2) Call MD if unable to obtain prior to antibiotics being given     Status: Abnormal   Collection Time: 12/24/18  1:42 AM   Specimen: BLOOD  Result Value Ref Range Status   Specimen Description   Final    BLOOD RIGHT FOREARM Performed at [facility], [address], [place], [place] [address]    Special Requests   Final    BOTTLES DRAWN AEROBIC AND ANAEROBIC Blood Culture adequate volume Performed at [facility], [address] [place], [place] [address]    Culture  Setup Time   Final    AEROBIC BOTTLE ONLY GRAM POSITIVE COCCI CRITICAL RESULT CALLED TO, READ BACK BY AND VERIFIED WITH: ROBBINS,J AT 2046 ON 12/24/2018 BY MOSLEY,J GRAM POSITIVE RODS ANAEROBIC BOTTLE ONLY CRITICAL RESULT CALLED TO, READ BACK BY AND VERIFIED WITH: DAVID BESANTI AT [place] ON 12/25/18 RWW    Culture (A)  Final    STAPHYLOCOCCUS  SPECIES (COAGULASE NEGATIVE) THE SIGNIFICANCE OF ISOLATING THIS ORGANISM FROM A SINGLE SET OF BLOOD CULTURES WHEN MULTIPLE SETS ARE DRAWN IS UNCERTAIN. PLEASE NOTIFY THE MICROBIOLOGY DEPARTMENT WITHIN ONE WEEK IF SPECIATION AND SENSITIVITIES ARE REQUIRED. DIPHTHEROIDS(CORYNEBACTERIUM SPECIES) Standardized susceptibility testing for this organism is not available. Performed at [facility], [place] [address] [place], [place] [address]    Report Status 12/26/2018 FINAL  Final  Blood Culture ID Panel (Reflexed)     Status: Abnormal   Collection Time: 12/24/18  1:42 AM  Result Value Ref Range Status   Enterococcus species NOT DETECTED NOT DETECTED Final   Listeria monocytogenes NOT DETECTED NOT DETECTED Final   Staphylococcus species DETECTED (A) NOT DETECTED  Final    Comment: Methicillin (oxacillin) resistant coagulase negative staphylococcus. Possible blood culture contaminant (unless isolated from more than one blood culture draw or clinical case suggests pathogenicity). No antibiotic treatment is indicated for blood  culture contaminants. CRITICAL RESULT CALLED TO, READ BACK BY AND VERIFIED WITH: ROBBINS,J AT 2046 ON 12/24/2018 BY MOSLEY,J    Staphylococcus aureus (BCID) NOT DETECTED NOT DETECTED Final   Methicillin resistance DETECTED (A) NOT DETECTED Final    Comment: CRITICAL RESULT CALLED TO, READ BACK BY AND VERIFIED WITH: ROBBINS,J AT 2046 ON 12/24/2018 BY MOSLEY,J    Streptococcus species NOT DETECTED NOT DETECTED Final   Streptococcus agalactiae NOT DETECTED NOT DETECTED Final   Streptococcus pneumoniae NOT DETECTED NOT DETECTED Final   Streptococcus pyogenes NOT DETECTED NOT DETECTED Final   Acinetobacter baumannii NOT DETECTED NOT DETECTED Final   Enterobacteriaceae species NOT DETECTED NOT DETECTED Final   Enterobacter cloacae complex NOT DETECTED NOT DETECTED Final   Escherichia coli NOT DETECTED NOT DETECTED Final   Klebsiella oxytoca NOT DETECTED NOT DETECTED Final    Klebsiella pneumoniae NOT DETECTED NOT DETECTED Final   Proteus species NOT DETECTED NOT DETECTED Final   Serratia marcescens NOT DETECTED NOT DETECTED Final   Haemophilus influenzae NOT DETECTED NOT DETECTED Final   Neisseria meningitidis NOT DETECTED NOT DETECTED Final   Pseudomonas aeruginosa NOT DETECTED NOT DETECTED Final   Candida albicans NOT DETECTED NOT DETECTED Final   Candida glabrata NOT DETECTED NOT DETECTED Final   Candida krusei NOT DETECTED NOT DETECTED Final   Candida parapsilosis NOT DETECTED NOT DETECTED Final   Candida tropicalis NOT DETECTED NOT DETECTED Final    Comment: Performed at [facility], [address] [place], [place] [address]  Culture, blood (routine x 2)     Status: None   Collection Time: 12/25/18  9:25 AM   Specimen: BLOOD  Result Value Ref Range Status   Specimen Description BLOOD RIGHT ANTECUBITAL  Final   Special Requests   Final    BOTTLES DRAWN AEROBIC AND ANAEROBIC Blood Culture results may not be optimal due to an inadequate volume of blood received in culture bottles   Culture   Final    NO GROWTH 5 DAYS Performed at [facility], [address] [place], [place] [address]    Report Status 12/30/2018 FINAL  Final  Culture, blood (routine x 2)     Status: None   Collection Time: 12/25/18  9:34 AM   Specimen: BLOOD  Result Value Ref Range Status   Specimen Description BLOOD BLOOD RIGHT FOREARM  Final   Special Requests   Final    BOTTLES DRAWN AEROBIC AND ANAEROBIC Blood Culture adequate volume   Culture   Final    NO GROWTH 5 DAYS Performed at [facility], [address] [place], [place] [address]    Report Status 12/30/2018 FINAL  Final  MRSA PCR Screening     Status: None   Collection Time: 12/28/18  6:26 PM   Specimen: Nasal Mucosa; Nasopharyngeal  Result Value Ref Range Status   MRSA by PCR NEGATIVE NEGATIVE Final    Comment:        The GeneXpert MRSA Assay (FDA approved for NASAL  specimens only), is one component of a comprehensive MRSA colonization surveillance program. It is not intended to diagnose MRSA infection nor to guide or monitor treatment for MRSA infections. Performed at [facility], [address], [place], [place] [address]  Scheduled Meds: . apixaban  5 mg Oral BID  . bisoprolol  5 mg Oral BID  . budesonide (PULMICORT) nebulizer solution  0.5 mg Nebulization BID  . calcium-vitamin D  1 tablet Oral BID  . Chlorhexidine Gluconate Cloth  6 each Topical Daily  . citalopram  20 mg Oral QHS  . cloNIDine  0.3 mg Transdermal Weekly  . diltiazem  300 mg Oral Daily  . famotidine  20 mg Oral BID  . furosemide  40 mg Intravenous Daily  . guaiFENesin  600 mg Oral BID  . insulin aspart  0-20 Units Subcutaneous Q4H  . ipratropium-albuterol  3 mL Nebulization Q6H  . levothyroxine  150 mcg Oral QAC breakfast  . LORazepam  0.5 mg Oral TID  . mouth rinse  15 mL Mouth Rinse BID  . nystatin  5 mL Oral QID  . pravastatin  20 mg Oral q1800  . QUEtiapine  25 mg Oral QHS  . sodium chloride flush  3 mL Intravenous Q12H   Continuous Infusions: . sodium chloride Stopped (12/27/18 2247)    Assessment/Plan:  1. Acute on chronic hypoxic respiratory failure.  Patient back to her baseline oxygen. 2. Acute delirium with underlying dementia.  Continue Seroquel 25 mg nightly.  Patient now taking oral medications so can take her usual BuSpar, Celexa and oral Ativan.  Patient's delirium is a little bit better today.  Reevaluate tomorrow. 3. COPD exacerbation.  Continue nebulizers and inhalers.  Patient completed antibiotics and steroids. 4. Paroxysmal atrial fibrillation with rapid ventricular response.  Continue oral Cardizem and oral bisoprolol.  Continue Eliquis for anticoagulation. 5. Hypertension on oral bisoprolol and Cardizem. 6. Hypothyroidism unspecified on Synthroid 7. 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 8. Anxiety  and depression on Celexa, BuSpar and Ativan 9. Weakness.  Physical therapy recommending rehab but patient follows with the pace program and they are setting up a hospital bed and wheelchair at home. They will like to do the rehab on their own and bring her to the facility on a daily basis to get her stronger.  Code Status:     Code Status Orders  (From admission, onward)         Start     Ordered   12/23/18 2226  Full code  Continuous     12/23/18 2233        Code Status History    Date Active Date Inactive Code Status Order ID Comments User Context   02/03/2018 0934 02/10/2018 1954 Full Code [id number]  Arta Silence, MD Inpatient   02/02/2014 0933 03/05/2014 1624 Full Code [id number]  Wilhelmina Mcardle, MD Inpatient   02/01/2014 1236 02/02/2014 0933 Full Code [id number]  Rob Hickman, MD Inpatient   01/29/2014 1119 02/01/2014 1236 Full Code [id number]  Roland Rack, MD Inpatient   Advance Care Planning Activity    Advance Directive Documentation     Most Recent Value  Type of Advance Directive  Healthcare Power of Attorney  Pre-existing out of facility DNR order (yellow form or pink MOST form)  -  "MOST" Form in Place?  -     Family Communication: Spoke with daughter on the phone Disposition Plan: Potential discharge home with pace program tomorrow  Time spent: 27 minutes  [place]

## 2019-01-02 NOTE — Progress Notes (Signed)
CRITICAL CARE NOTE  Patient Details:    Summer Bradley is an 73 y.o. female  admitted with acute on chronic hypoxic and hypercarbic respiratory failure secondary to AECOPD requiring BiPAP.  She has very severe COPD (stage IV) and has deteriorated due to delirium.     Events: 11/6: Pt admitted to the telemetry unit  11/6: CTA Chest revealed evaluation for pulmonary emboli is limited by motion artifact. Given this limitation, no PE was identified. Cardiomegaly with coronary artery disease. Moderate-sized hiatal hernia. Cholelithiasis without CT evidence for acute cholecystitis. Emphysema and aortic atherosclerosis.Aortic Atherosclerosis (ICD10-I70.0) and Emphysema (ICD10-J43.9). 11/7: CT Head/Cervical Spine revealed small left frontal scalp contusion. No calvarial fracture. No acute intracranial abnormality. No acute fracture or dislocation of cervical spine. Large chronic right MCA distribution infarction as well as small infarctions of right cerebellar hemisphere and left thalamus. Advanced cervical spondylosis with prominent left-sided facet arthropathy. 11/8: Echo revealed left ventricular ejection fraction, by visual estimation, is 55 to 60%. The left ventricle has normal function. There is no left ventricular hypertrophy. Global right ventricle has normal systolic function.The right ventricular size is mildly enlarged. No increase in right ventricular wall thickness. 11/11: Rapid response initiated due to pt developing worsening acute on chronic respiratory failure and encephalopathy requiring transfer to the stepdown unit for BiPAP 11/12: Lethargic today.  Required Precedex due to delirium, this is now off. 11/13 - on 0.8 of precedex for aggitation, heparin drip for AF, cardizem drip for accelerated htn.  Patient is DNR.  Palliative GOC done - family wants to keep trying for now 11/14-stopping precedex gtt 11/15- patient did well without precedex. She takes ativan at home.  She was  able to take po meds today, we will try to transition cardizem and anticoagulation to po today, OOB with PT 11/16 remains on biPAP this AM     CC  follow up respiratory failure  SUBJECTIVE Patient remains on biPAP Prognosis is guarded   BP (!) 169/106   Pulse 87   Temp 98.7 F (37.1 C) (Axillary)   Resp (!) 22   Ht 5\' 3"  (1.6 m)   Wt 95.4 kg   SpO2 95%   BMI 37.26 kg/m    I/O last 3 completed shifts: In: 2130 [P.O.:300; I.V.:536.6; IV Piggyback:1293.4] Out: 3085 [Urine:3085] No intake/output data recorded.  SpO2: 95 % O2 Flow Rate (L/min): 4 L/min FiO2 (%): 30 %   SIGNIFICANT EVENTS   REVIEW OF SYSTEMS  PATIENT IS UNABLE TO PROVIDE COMPLETE REVIEW OF SYSTEMS DUE TO SEVERE CRITICAL ILLNESS   PHYSICAL EXAMINATION:  GENERAL:critically ill appearing, +resp distress HEAD: Normocephalic, atraumatic.  EYES: Pupils equal, round, reactive to light.  No scleral icterus.  MOUTH: Moist mucosal membrane. NECK: Supple.  PULMONARY: +rhonchi, +wheezing CARDIOVASCULAR: S1 and S2. Regular rate and rhythm. No murmurs, rubs, or gallops.  GASTROINTESTINAL: Soft, nontender, -distended. No masses. Positive bowel sounds. No hepatosplenomegaly.  MUSCULOSKELETAL: No swelling, clubbing, or edema.  NEUROLOGIC: lethargic SKIN:intact,warm,dry  MEDICATIONS: I have reviewed all medications and confirmed regimen as documented   CULTURE RESULTS   Recent Results (from the past 240 hour(s))  SARS CORONAVIRUS 2 (TAT 6-24 HRS) Nasopharyngeal Nasopharyngeal Swab     Status: None   Collection Time: 12/23/18 10:30 PM   Specimen: Nasopharyngeal Swab  Result Value Ref Range Status   SARS Coronavirus 2 NEGATIVE NEGATIVE Final    Comment: (NOTE) SARS-CoV-2 target nucleic acids are NOT DETECTED. The SARS-CoV-2 RNA is generally detectable in upper and lower respiratory specimens during  the acute phase of infection. Negative results do not preclude SARS-CoV-2 infection, do not rule  out co-infections with other pathogens, and should not be used as the sole basis for treatment or other patient management decisions. Negative results must be combined with clinical observations, patient history, and epidemiological information. The expected result is Negative. Fact Sheet for Patients: SugarRoll.be Fact Sheet for Healthcare Providers: https://www.woods-mathews.com/ This test is not yet approved or cleared by the [place] FDA and  has been authorized for detection and/or diagnosis of SARS-CoV-2 by FDA under an Emergency Use Authorization (EUA). This EUA will remain  in effect (meaning this test can be used) for the duration of the COVID-19 declaration under Section 56 4(b)(1) of the Act, 21 U.S.C. section 360bbb-3(b)(1), unless the authorization is terminated or revoked sooner. Performed at [facility], [place] [address], [place], [place] [address]   Culture, blood (routine x 2) Call MD if unable to obtain prior to antibiotics being given     Status: Abnormal   Collection Time: 12/24/18  1:42 AM   Specimen: BLOOD  Result Value Ref Range Status   Specimen Description   Final    BLOOD RIGHT FOREARM Performed at [facility], [address], [place], [place] [address]    Special Requests   Final    BOTTLES DRAWN AEROBIC AND ANAEROBIC Blood Culture adequate volume Performed at [facility], [address] [place], [place] [address]    Culture  Setup Time   Final    AEROBIC BOTTLE ONLY GRAM POSITIVE COCCI CRITICAL RESULT CALLED TO, READ BACK BY AND VERIFIED WITH: ROBBINS,J AT 2046 ON 12/24/2018 BY MOSLEY,J GRAM POSITIVE RODS ANAEROBIC BOTTLE ONLY CRITICAL RESULT CALLED TO, READ BACK BY AND VERIFIED WITH: DAVID BESANTI AT [place] ON 12/25/18 RWW    Culture (A)  Final    STAPHYLOCOCCUS SPECIES (COAGULASE NEGATIVE) THE SIGNIFICANCE OF ISOLATING THIS ORGANISM FROM A SINGLE SET OF BLOOD CULTURES WHEN  MULTIPLE SETS ARE DRAWN IS UNCERTAIN. PLEASE NOTIFY THE MICROBIOLOGY DEPARTMENT WITHIN ONE WEEK IF SPECIATION AND SENSITIVITIES ARE REQUIRED. DIPHTHEROIDS(CORYNEBACTERIUM SPECIES) Standardized susceptibility testing for this organism is not available. Performed at [facility], [place] [address], [place], [place] [address]    Report Status 12/26/2018 FINAL  Final  Blood Culture ID Panel (Reflexed)     Status: Abnormal   Collection Time: 12/24/18  1:42 AM  Result Value Ref Range Status   Enterococcus species NOT DETECTED NOT DETECTED Final   Listeria monocytogenes NOT DETECTED NOT DETECTED Final   Staphylococcus species DETECTED (A) NOT DETECTED Final    Comment: Methicillin (oxacillin) resistant coagulase negative staphylococcus. Possible blood culture contaminant (unless isolated from more than one blood culture draw or clinical case suggests pathogenicity). No antibiotic treatment is indicated for blood  culture contaminants. CRITICAL RESULT CALLED TO, READ BACK BY AND VERIFIED WITH: ROBBINS,J AT 2046 ON 12/24/2018 BY MOSLEY,J    Staphylococcus aureus (BCID) NOT DETECTED NOT DETECTED Final   Methicillin resistance DETECTED (A) NOT DETECTED Final    Comment: CRITICAL RESULT CALLED TO, READ BACK BY AND VERIFIED WITH: ROBBINS,J AT 2046 ON 12/24/2018 BY MOSLEY,J    Streptococcus species NOT DETECTED NOT DETECTED Final   Streptococcus agalactiae NOT DETECTED NOT DETECTED Final   Streptococcus pneumoniae NOT DETECTED NOT DETECTED Final   Streptococcus pyogenes NOT DETECTED NOT DETECTED Final   Acinetobacter baumannii NOT DETECTED NOT DETECTED Final   Enterobacteriaceae species NOT DETECTED NOT DETECTED Final   Enterobacter cloacae complex NOT  DETECTED NOT DETECTED Final   Escherichia coli NOT DETECTED NOT DETECTED Final   Klebsiella oxytoca NOT DETECTED NOT DETECTED Final   Klebsiella pneumoniae NOT DETECTED NOT DETECTED Final   Proteus species NOT DETECTED NOT DETECTED Final   Serratia  marcescens NOT DETECTED NOT DETECTED Final   Haemophilus influenzae NOT DETECTED NOT DETECTED Final   Neisseria meningitidis NOT DETECTED NOT DETECTED Final   Pseudomonas aeruginosa NOT DETECTED NOT DETECTED Final   Candida albicans NOT DETECTED NOT DETECTED Final   Candida glabrata NOT DETECTED NOT DETECTED Final   Candida krusei NOT DETECTED NOT DETECTED Final   Candida parapsilosis NOT DETECTED NOT DETECTED Final   Candida tropicalis NOT DETECTED NOT DETECTED Final    Comment: Performed at [facility], [address] [place], [place] [address]  Culture, blood (routine x 2)     Status: None   Collection Time: 12/25/18  9:25 AM   Specimen: BLOOD  Result Value Ref Range Status   Specimen Description BLOOD RIGHT ANTECUBITAL  Final   Special Requests   Final    BOTTLES DRAWN AEROBIC AND ANAEROBIC Blood Culture results may not be optimal due to an inadequate volume of blood received in culture bottles   Culture   Final    NO GROWTH 5 DAYS Performed at [facility], [address] [place], [place] [address]    Report Status 12/30/2018 FINAL  Final  Culture, blood (routine x 2)     Status: None   Collection Time: 12/25/18  9:34 AM   Specimen: BLOOD  Result Value Ref Range Status   Specimen Description BLOOD BLOOD RIGHT FOREARM  Final   Special Requests   Final    BOTTLES DRAWN AEROBIC AND ANAEROBIC Blood Culture adequate volume   Culture   Final    NO GROWTH 5 DAYS Performed at [facility], [address] [place], [place] [address]    Report Status 12/30/2018 FINAL  Final  MRSA PCR Screening     Status: None   Collection Time: 12/28/18  6:26 PM   Specimen: Nasal Mucosa; Nasopharyngeal  Result Value Ref Range Status   MRSA by PCR NEGATIVE NEGATIVE Final    Comment:        The GeneXpert MRSA Assay (FDA approved for NASAL specimens only), is one component of a comprehensive MRSA colonization surveillance program. It is not intended to diagnose  MRSA infection nor to guide or monitor treatment for MRSA infections. Performed at [facility], [address] [place],  [address]           ASSESSMENT AND PLAN SYNOPSIS   Severe ACUTE Hypoxic and Hypercapnic Respiratory Failure from COPD exacerbation Wean off biPAP as tolerated -continue Bronchodilator Therapy Oxygen as needed  SEVERE COPD EXACERBATION -continue IV steroids as prescribed -continue NEB THERAPY as prescribed -morphine as needed -wean fio2 as needed and tolerated    NEUROLOGY Avoid sedatives    CARDIAC ICU monitoring  ID -continue IV abx as prescibed -follow up cultures  GI GI PROPHYLAXIS as indicated  NUTRITIONAL STATUS DIET-->NPO  Constipation protocol as indicated  ENDO - will use ICU hypoglycemic\Hyperglycemia protocol if indicated   ELECTROLYTES -follow labs as needed -replace as needed -pharmacy consultation and following   DVT/GI PRX ordered TRANSFUSIONS AS NEEDED MONITOR FSBS ASSESS the need for LABS as needed   Prognosis is poor, recommend palliative care consultation  Corrin Parker, M.D.  [facility]  Medical Director [place]  Medical Director [place] Department

## 2019-01-02 NOTE — Progress Notes (Signed)
OT Cancellation Note  Patient Details Name: Summer Bradley MRN: [id number] DOB: 02/22/1945   Cancelled Treatment:    Reason Eval/Treat Not Completed: Patient declined, no reason specified Order received and chart reviewed.  Pt lying on her side in bed with improved breathing and continues to be on nasal cannula O2  but is lethargic and declined participation in OT evaluation after encouragement provided.  Will attempt again tomorrow.  Chrys Racer, OTR/L, NTMTC ascom [phone] 01/02/19, 1:17 PM

## 2019-01-02 NOTE — Progress Notes (Addendum)
  Speech Language Pathology Treatment: Dysphagia  Patient Details Name: Summer Bradley MRN: [id number] DOB: 06-02-1945 Today's Date: 01/02/2019 Time: 1240-1320 SLP Time Calculation (min) (ACUTE ONLY): 40 min  Assessment / Plan / Recommendation Clinical Impression  Pt seen today for trials to upgrade diet consistency if able. Per cardiology note, remains in atrial fibrillation but ventricular rate is more controlled. Respiratory effort is mildly increased w/ movement in bed for positioning upright for po's. Pt also remains Confused w/ declined Cognitive status. Mod+ verbal/tactile cues needed for follow through w/ instructions.  Pt consumed trials of Nectar liquids via tsp then straw w/ SLP monitoring and use of general aspiration precautions. No wet vocal quality or increased respiratory effort (from her baseline status) was noted during/post po trials of the Nectar liquids. No immediate, overt coughing. Pt does have a Mod. Hiatal Hernia so trials were given slowly in small amounts d/t pt just having finished Lunch meal w/ NA - she ate 50+% it appeared. Recommend strict REFLUX precautions.  Recommend diet upgrade to Dysphagia level 1 (puree -- pt is NOT wearing her Dentures at this time) w/ Nectar consistency liquids; aspiration and REFLUX precautions; supervision and support w/ feedings at all meals. Recommend Pills in Puree for safer swallowing. ST services will continue to f/u w/ pt's status next 2-3 days w/ trials to upgrade diet consistency as appropriate in line w/ pt's GOC and medical status. Pt has a Palliative Care order active -- recommend discussion of [place].     HPI HPI: Pt is a 73 y.o. Caucasian female with a known history of Multiple medical problems including COPD on 3L home O2, atrial fibrillation, Obesity, hypertension, GERD, Hiatal Hernia, stroke w/ h/o dsyphagia in 2015-2016, and hypothyroidism, who presented to the emergency room with acute onset of worsening dyspnea with  associated dry cough, wheezing and generalized weakness, since yesterday.  Chest CTA revealed PE, cardiomegaly with coronary artery disease, moderate size hiatal hernia and cholelithiasis without cholecystitis as well as emphysema and aortic sclerosis.  EKG showed atrial fibrillation with rapid response of 128 with low voltage QRS.  Pt remains on Precedex secondary to delirium and agitation.       SLP Plan  Continue with current plan of care       Recommendations  Diet recommendations: Dysphagia 1 (puree);Nectar-thick liquid Liquids provided via: Cup;Straw(monitor) Medication Administration: Crushed with puree(as needed) Supervision: Staff to assist with self feeding;Full supervision/cueing for compensatory strategies Compensations: Minimize environmental distractions;Slow rate;Small sips/bites;Lingual sweep for clearance of pocketing;Multiple dry swallows after each bite/sip;Follow solids with liquid Postural Changes and/or Swallow Maneuvers: Seated upright 90 degrees;Upright 30-60 min after meal                General recommendations: (Dietician) Oral Care Recommendations: Oral care BID;Oral care before and after PO;Staff/trained caregiver to provide oral care Follow up Recommendations: Skilled Nursing facility(TBD) SLP Visit Diagnosis: Dysphagia, oropharyngeal phase (R13.12) Plan: Continue with current plan of care       [place], [place], CCC-SLP , 01/02/2019, 2:43 PM

## 2019-01-02 NOTE — Progress Notes (Signed)
Progress Note  Patient Name: Summer Bradley Date of Encounter: 01/02/2019  Primary Cardiologist: New to [facility] - consult by End  Subjective   She remains in atrial fibrillation but ventricular rate is more controlled.  She does not seem to be oriented.  Inpatient Medications    Scheduled Meds: . apixaban  5 mg Oral BID  . bisoprolol  5 mg Oral BID  . budesonide (PULMICORT) nebulizer solution  0.5 mg Nebulization BID  . calcium-vitamin D  1 tablet Oral BID  . Chlorhexidine Gluconate Cloth  6 each Topical Daily  . citalopram  20 mg Oral QHS  . cloNIDine  0.3 mg Transdermal Weekly  . diltiazem  300 mg Oral Daily  . famotidine  20 mg Oral BID  . furosemide  40 mg Intravenous Daily  . guaiFENesin  600 mg Oral BID  . insulin aspart  0-20 Units Subcutaneous Q4H  . ipratropium-albuterol  3 mL Nebulization Q6H  . levothyroxine  150 mcg Oral QAC breakfast  . LORazepam  0.5 mg Oral TID  . mouth rinse  15 mL Mouth Rinse BID  . nystatin  5 mL Oral QID  . pravastatin  20 mg Oral q1800  . QUEtiapine  25 mg Oral QHS  . sodium chloride flush  3 mL Intravenous Q12H   Continuous Infusions: . sodium chloride Stopped (12/27/18 2247)   PRN Meds: sodium chloride, acetaminophen **OR** acetaminophen, guaiFENesin-codeine, hydrALAZINE, levalbuterol, magnesium hydroxide, metoprolol tartrate, polyvinyl alcohol, sodium chloride flush, traZODone   Vital Signs    Vitals:   01/02/19 1131 01/02/19 1158 01/02/19 1219 01/02/19 1450  BP: (!) 144/97  (!) 136/97   Pulse:  80 84 (!) 104  Resp: (!) 23 (!) 23 19 20   Temp:   98.5 F (36.9 C)   TempSrc:   Oral   SpO2:  96% 97%   Weight:      Height:        Intake/Output Summary (Last 24 hours) at 01/02/2019 1522 Last data filed at 01/02/2019 1300 Gross per 24 hour  Intake 366.97 ml  Output 400 ml  Net -33.03 ml   Filed Weights   12/27/18 0606 12/28/18 0302 12/28/18 1819  Weight: 96.3 kg 94.8 kg 95.4 kg    Telemetry    Afib with  reasonably controlled ventricular response - Personally Reviewed  ECG    No new tracings - Personally Reviewed  Physical Exam   GEN: No acute distress. Intermittently agitated. Off Precedex this morning.  Neck: No JVD. Cardiac: Irregularly irregular, no murmurs, rubs, or gallops.  Respiratory: Diminished breath sounds bilateral bases.  GI: Soft, nontender, non-distended.   MS: Trace bilateral ankle edema; No deformity. Neuro:  More alert.  Psych: Intermittent agitation.  Labs    Chemistry Recent Labs  Lab 12/30/18 0450 12/31/18 0653 01/01/19 0152  NA 148* 151* 153*  K 3.8 3.8 3.3*  CL 109 115* 112*  CO2 29 26 32  GLUCOSE 123* 110* 100*  BUN 42* 40* 34*  CREATININE 0.94 0.81 0.91  CALCIUM 9.0 8.8* 8.8*  PROT  --   --  6.1*  ALBUMIN  --  3.3* 3.3*  AST  --   --  52*  ALT  --   --  62*  ALKPHOS  --   --  43  BILITOT  --   --  3.1*  GFRNONAA 60* >60 >60  GFRAA >60 >60 >60  ANIONGAP 10 10 9      Hematology Recent Labs  Lab 12/31/18 0652 01/01/19 0152 01/02/19 0448  WBC 12.6* 14.7* 14.5*  RBC 5.02 5.20* 5.17*  HGB 15.4* 15.8* 16.2*  HCT 48.2* 49.7* 49.5*  MCV 96.0 95.6 95.7  MCH 30.7 30.4 31.3  MCHC 32.0 31.8 32.7  RDW 14.8 14.7 14.7  PLT 224 209 198    Cardiac EnzymesNo results for input(s): TROPONINI in the last 168 hours. No results for input(s): TROPIPOC in the last 168 hours.   BNP Recent Labs  Lab 12/29/18 0521  BNP 518.0*     DDimer No results for input(s): DDIMER in the last 168 hours.   Radiology    Dg Chest Port 1 View  Result Date: 12/28/2018 IMPRESSION: Stable mild cardiomegaly without overt pulmonary edema. No acute pulmonary disease. Emphysema. Electronically Signed   By: Ilona Sorrel M.D.   On: 12/28/2018 17:32    Cardiac Studies   2D echo 12/25/2018: 1. Left ventricular ejection fraction, by visual estimation, is 55 to 60%. The left ventricle has normal function. There is no left ventricular hypertrophy.  2. Global right  ventricle has normal systolic function.The right ventricular size is mildly enlarged. No increase in right ventricular wall thickness.  3. Left atrial size was mildly dilated.  4. Tricuspid valve regurgitation mild-moderate.  5. Moderately elevated pulmonary artery systolic pressure.  6. The tricuspid regurgitant velocity is 2.88 m/s, and with an assumed right atrial pressure of 10 mmHg, the estimated right ventricular systolic pressure is moderately elevated at 43.1 mmHg.  7. Rhythm is atrial fibrillation  8. Challenging image quality  Patient Profile     73 y.o. female with history of PAF, chronic hypoxic respiratory failure on supplemental oxygen at home, COPD, and HTN who we are seeing for Afib.   Assessment & Plan    1. Persistent Afib: -Ventricular rate is better controlled on diltiazem 300 mg daily and bisoprolol 5 mg twice daily.  The dose of diltiazem can be further increased to 360 mg daily if needed.  Can consider decreasing clonidine in order to allow up titration of these medications. -The patient is currently on anticoagulation with Eliquis 5 mg twice daily. Recommend rate control for atrial fibrillation.  I do not think she is a candidate for cardioversion in the future given her dementia.  2. Acute on chronic hypoxic respiratory distress: -Secondary to AECOPD -Per IM  3. Delirium with underlying dementia/agitation: -Seems to have improved with less agitation.   For questions or updates, please contact [place] Please consult [email or web address] for contact info under Cardiology/STEMI.     Kathlyn [place],  MD 01/02/2019 3:22 PM

## 2019-01-03 LAB — BASIC METABOLIC PANEL
Anion gap: 10 (ref 5–15)
BUN: 30 mg/dL — ABNORMAL HIGH (ref 8–23)
CO2: 31 mmol/L (ref 22–32)
Calcium: 8.9 mg/dL (ref 8.9–10.3)
Chloride: 109 mmol/L (ref 98–111)
Creatinine, Ser: 1.01 mg/dL — ABNORMAL HIGH (ref 0.44–1.00)
GFR calc Af Amer: >60 mL/min (ref >60)
GFR calc non Af Amer: 55 mL/min — ABNORMAL LOW (ref >60)
Glucose, Bld: 126 mg/dL — ABNORMAL HIGH (ref 70–99)
Potassium: 3.3 mmol/L — ABNORMAL LOW (ref 3.5–5.1)
Sodium: 150 mmol/L — ABNORMAL HIGH (ref 135–145)

## 2019-01-03 LAB — GLUCOSE, CAPILLARY
Glucose-Capillary: 121 mg/dL — ABNORMAL HIGH (ref 70–99)
Glucose-Capillary: 126 mg/dL — ABNORMAL HIGH (ref 70–99)
Glucose-Capillary: 147 mg/dL — ABNORMAL HIGH (ref 70–99)

## 2019-01-03 LAB — MAGNESIUM: Magnesium: 2.5 mg/dL — ABNORMAL HIGH (ref 1.7–2.4)

## 2019-01-03 MED ORDER — FAMOTIDINE 20 MG PO TABS: 20.0000 mg | ORAL_TABLET | Freq: Every day | ORAL | 0 refills | Status: AC

## 2019-01-03 MED ORDER — CLONIDINE 0.3 MG/24HR TD PTWK: 0.3000 mg | MEDICATED_PATCH | TRANSDERMAL | 0 refills | Status: DC

## 2019-01-03 MED ORDER — DEXTROSE 5 % IV BOLUS
250.0000 mL | Freq: Once | INTRAVENOUS | Status: AC
  Administered 2019-01-03: 08:00:00 250 mL via INTRAVENOUS

## 2019-01-03 MED ORDER — QUETIAPINE FUMARATE 25 MG PO TABS: 25.0000 mg | ORAL_TABLET | Freq: Every evening | ORAL | 0 refills | Status: AC | PRN

## 2019-01-03 MED ORDER — POTASSIUM CHLORIDE 20 MEQ PO PACK
40.0000 meq | PACK | Freq: Once | ORAL | Status: AC
  Administered 2019-01-03: 08:00:00 40 meq via ORAL
  Filled 2019-01-03: qty 2

## 2019-01-03 MED ORDER — DILTIAZEM HCL ER COATED BEADS 300 MG PO CP24: 300.0000 mg | ORAL_CAPSULE | Freq: Every day | ORAL | 0 refills | Status: AC

## 2019-01-03 MED ORDER — NYSTATIN 100000 UNIT/ML MT SUSP: 5.0000 mL | Freq: Four times a day (QID) | OROMUCOSAL | 0 refills | Status: DC

## 2019-01-03 NOTE — Evaluation (Signed)
Occupational Therapy Evaluation Patient Details Name: Summer Bradley MRN: [id number] DOB: 1945/04/21 Today's Date: 01/03/2019    History of Present Illness Pt is a 73 year old female admitted with atrial fibrillation with RVR and COPD exacerbation following c/o weakness.  PMH includes depression, anxiety, atrial fibrillation and CVA.   Clinical Impression   Pt is 73 year old female who presents to [facility] hospital with atrial fibrillation with RVR and COPD exacerbation. She is part of [facility]ACE program and would benefit from SNF but her and family want to go home with [facility] instead.  She is on 3L of nasal cannula with SOB and poor functional endurance and fatigues easily.  She was seen after PT session and did well with min to mod assist for PT and then 30 minutes later was having difficulty staying awake and attending to OT session and breathing was labored.  Pt seen for limited assessment due to this and currently requires max assist to total assist for all ADLs.  An AD catalog was left in patient's room to review with family later today to promote independence with ADLs.  Rec education and training in energy conservation tech and pursed lip breathig which will also help decrease anxiety.  Pt would benefit from skilled OT services to increase independence in ADLs, education in energy conservation techniques, pursed lip breathing and recommendations for home modifications to increase safety and prevent falls.  Rec OT HH via PACE after discharge home.    Follow Up Recommendations  Home health OT    Equipment Recommendations  3 in 1 bedside commode;Wheelchair (measurements OT);Tub/shower bench;Other (comment)(might benefit from AD for LB dressing skills as well.)    Recommendations for Other Services       Precautions / Restrictions Precautions Precautions: Fall Restrictions Weight Bearing Restrictions: No      Mobility Bed Mobility Overal bed mobility: Needs Assistance Bed Mobility:  Rolling;Supine to Sit;Sit to Supine Rolling: Mod assist   Supine to sit: Mod assist Sit to supine: Min assist      Transfers Overall transfer level: Needs assistance Equipment used: Rolling walker (2 wheeled) Transfers: Sit to/from Stand Sit to Stand: Min guard;+2 physical assistance;+2 safety/equipment              Balance Overall balance assessment: Needs assistance Sitting-balance support: Feet supported Sitting balance-Leahy Scale: Fair     Standing balance support: Bilateral upper extremity supported Standing balance-Leahy Scale: Fair                             ADL either performed or assessed with clinical judgement   ADL                                         General ADL Comments: Pt currently on 3L of O2 with labored and effortful breathing and having difficulty attending to eval which has been attempted several times.  She reported that she has a transfer tub bench and reacher but no family in room to clarify.  She is very limited in functional endurance for all ADLs and currently max assist to dependent.  She is going home today with [facility] and not to SNF which was rec and is a PACE participant.  Will try to see patient again later today when family is present to discuss how to assist pt safely and rec AD  to promote indpendence in ADLs.     Vision         Perception     Praxis      Pertinent Vitals/Pain Pain Assessment: No/denies pain     Hand Dominance Right   Extremity/Trunk Assessment Upper Extremity Assessment Upper Extremity Assessment: Generalized weakness   Lower Extremity Assessment Lower Extremity Assessment: Generalized weakness   Cervical / Trunk Assessment Cervical / Trunk Assessment: Kyphotic   Communication Communication Communication: Other (comment)(pt very fatigued and having difficulty with accessory muscle strength to be able to speak whle lying in bed)   Cognition Arousal/Alertness:  Lethargic Behavior During Therapy: Flat affect Overall Cognitive Status: Difficult to assess                                 General Comments: Patient had just finsihed working with PT and was fatigued with sitting at EOB and taking small steps side to side to move toward top of bed.   General Comments       Exercises Other Exercises Other Exercises: stood x 2 with RW - was able to sidestep along bed to move up in bed.   Shoulder Instructions      Home Living Family/patient expects to be discharged to:: Private residence Living Arrangements: Children Available Help at Discharge: Family;Personal care attendant;Available 24 hours/day Type of Home: House Home Access: Stairs to enter [employer] of Steps: 2 Entrance Stairs-Rails: Right Home Layout: One level     Bathroom Shower/Tub: Teacher, early years/pre: Standard     Home Equipment: Environmental consultant - 2 wheels;Cane - single point   Additional Comments: Pt reported she had a tub bench and reacher but not clear if this is accurate or not.      Prior Functioning/Environment Level of Independence: Independent with assistive device(s)                 OT Problem List: Decreased strength;Decreased activity tolerance;Impaired balance (sitting and/or standing);Decreased knowledge of use of DME or AE;Obesity;Cardiopulmonary status limiting activity      OT Treatment/Interventions: Self-care/ADL training;DME and/or AE instruction;Energy conservation;Therapeutic activities;Patient/family education    OT Goals(Current goals can be found in the care plan section) Acute Rehab OT Goals Patient Stated Goal: Be more independent again. OT Goal Formulation: With patient Time For Goal Achievement: 01/17/19 Potential to Achieve Goals: Good ADL Goals Pt Will Perform Grooming: with set-up;with min assist;sitting Pt Will Perform Upper Body Dressing: with set-up;with min assist;sitting Pt Will Perform Lower  Body Dressing: with set-up;with max assist;sit to/from stand Pt Will Transfer to Toilet: with set-up;with min assist;with +2 assist;bedside commode  OT Frequency: Min 1X/week   Barriers to D/C:            Co-evaluation              AM-PAC OT "6 Clicks" Daily Activity     Outcome Measure Help from another person eating meals?: A Little Help from another person taking care of personal grooming?: A Lot Help from another person toileting, which includes using toliet, bedpan, or urinal?: A Lot Help from another person bathing (including washing, rinsing, drying)?: A Lot Help from another person to put on and taking off regular upper body clothing?: A Lot Help from another person to put on and taking off regular lower body clothing?: A Lot 6 Click Score: 13   End of Session    Activity Tolerance: Patient limited  by fatigue Patient left: in bed;with bed alarm set;with call bell/phone within reach  OT Visit Diagnosis: Muscle weakness (generalized) (M62.81);Other abnormalities of gait and mobility (R26.89)                Time: 1020-1040 OT Time Calculation (min): 20 min Charges:  OT General Charges $OT Visit: 1 Visit OT Evaluation $OT Eval Low Complexity: 1 Low  Chrys Racer, OTR/L, [place] ascom [phone] 01/03/19, 11:01 AM

## 2019-01-03 NOTE — Progress Notes (Signed)
Physical Therapy Treatment Patient Details Name: Summer Bradley MRN: [id number] DOB: Jun 01, 1945 Today's Date: 01/03/2019    History of Present Illness Pt is a 73 year old female admitted with atrial fibrillation with RVR and COPD exacerbation following c/o weakness.  PMH includes depression, anxiety, atrial fibrillation and CVA.    PT Comments    Pt in bed, agrees to session.  Rolling with mod a x 1. To edge of bed with mod a x 1.  Once sitting she is able to sit unsupported x 10 minutes for full linen change- had spilt orange juice in bed.  She was able to stand x 2 with RW and min assist/guard x 2.  Steady in standing with light support and was able to sidestep along bed to reposition before laying back down.  She was able to do this x 2.  Returned to bed with min a x 1.  Pt is being discharged today to home with family per RN- refusing SNF.  She will need +1 assist at all times for mobility skills but should be able to transfer safely to wheelchair or commode at home.  Ambulation remains limited and will need a wheelchair for household mobility at this time for more than transfers.  HHPT would be appropriate.   Follow Up Recommendations  SNF;Other (comment)     Equipment Recommendations  Rolling walker with 5" wheels;Wheelchair (measurements PT);Hospital bed;3in1 (PT);Wheelchair cushion (measurements PT)    Recommendations for Other Services       Precautions / Restrictions Precautions Precautions: Fall Restrictions Weight Bearing Restrictions: No    Mobility  Bed Mobility Overal bed mobility: Needs Assistance Bed Mobility: Rolling;Supine to Sit;Sit to Supine Rolling: Mod assist   Supine to sit: Mod assist Sit to supine: Min assist      Transfers Overall transfer level: Needs assistance Equipment used: Rolling walker (2 wheeled) Transfers: Sit to/from Stand Sit to Stand: Min guard;+2 physical assistance;+2 safety/equipment            Ambulation/Gait                  Stairs             Wheelchair Mobility    Modified Rankin (Stroke Patients Only)       Balance Overall balance assessment: Needs assistance Sitting-balance support: Feet supported Sitting balance-Leahy Scale: Fair     Standing balance support: Bilateral upper extremity supported Standing balance-Leahy Scale: Fair                              Cognition Arousal/Alertness: Awake/alert Behavior During Therapy: WFL for tasks assessed/performed;Flat affect Overall Cognitive Status: Within Functional Limits for tasks assessed                                        Exercises Other Exercises Other Exercises: stood x 2 with RW - was able to sidestep along bed to move up in bed.    General Comments        Pertinent Vitals/Pain Pain Assessment: No/denies pain    Home Living                      Prior Function            PT Goals (current goals can now be found in the care plan section)  Progress towards PT goals: Progressing toward goals    Frequency    Min 2X/week      PT Plan Current plan remains appropriate    Co-evaluation              AM-PAC PT "6 Clicks" Mobility   Outcome Measure  Help needed turning from your back to your side while in a flat bed without using bedrails?: A Little Help needed moving from lying on your back to sitting on the side of a flat bed without using bedrails?: A Little Help needed moving to and from a bed to a chair (including a wheelchair)?: A Little Help needed standing up from a chair using your arms (e.g., wheelchair or bedside chair)?: A Little Help needed to walk in hospital room?: A Lot Help needed climbing 3-5 steps with a railing? : Total 6 Click Score: 15    End of Session Equipment Utilized During Treatment: Gait belt Activity Tolerance: Patient tolerated treatment well Patient left: in bed;with call bell/phone within reach;with bed alarm  set Nurse Communication: Mobility status       Time: [id number] PT Time Calculation (min) (ACUTE ONLY): 23 min  Charges:  $Therapeutic Activity: 23-37 mins                     Chesley Noon, PTA 01/03/19, 9:51 AM

## 2019-01-03 NOTE — Discharge Summary (Signed)
[facility] [place] NAME: Summer Bradley    MR#:  [id number]  DATE OF BIRTH:  [birth year]  DATE OF ADMISSION:  12/23/2018 ADMITTING PHYSICIAN: Christel Mormon, MD  DATE OF DISCHARGE: 01/03/2019  PRIMARY CARE PHYSICIAN: Wilsey    ADMISSION DIAGNOSIS:  Dyspnea [R06.00] COPD exacerbation (HCC) [J44.1]  DISCHARGE DIAGNOSIS:  Active Problems:   Paroxysmal A-fib (HCC)   COPD exacerbation (HCC)   Chronic heart failure with preserved ejection fraction (HFpEF) ([place])   Pulmonary hypertension, unspecified (HCC)   Acute delirium   Atrial fibrillation with RVR (HCC)   Anxiety and depression   Palliative care by specialist   Goals of care, counseling/discussion   SECONDARY DIAGNOSIS:   Past Medical History:  Diagnosis Date  . A-fib ([place])   . Anxiety   . COPD (chronic obstructive pulmonary disease) ([place])   . Cough    CHONIC  . Depression   . Dysrhythmia   . GERD (gastroesophageal reflux disease)   . Heart disease   . High cholesterol   . Hypertension   . Hypothyroidism   . Orthopnea   . Oxygen dependent    2/L CONTINUOUSLY  . Stroke ([place])   . Vertigo   . Wheezing     HOSPITAL COURSE:   1.  Acute on chronic hypoxic respiratory failure.  The patient was initially admitted to floor care.  She needed to be transferred to the ICU for BiPAP and poor air entry.  She was tapered off the BiPAP and back on her usual 3 L nasal cannula. 2.  Acute delirium with underlying dementia.  Patient was given Seroquel nightly.  She was on Precedex drip while she was in the ICU.  Her delirium has cleared and is now able to take her usual oral medications of BuSpar, Celexa and oral Ativan.  I will prescribe Seroquel only as needed nightly.  Hopefully can get rid of this medication when she is in her home environment. 3.  COPD exacerbation.  Patient will be continued on her nebulizers and inhalers.  The patient completed a course of steroids  and antibiotics while here in the hospital. 4.  Paroxysmal atrial fibrillation with rapid ventricular response during the hospital course.  When the patient was delirious she needed to go on IV drips for anticoagulation and rate control.  Now that her delirium is better she is back on her oral Cardizem and oral bisoprolol and Eliquis for anticoagulation. 5.  Hypertension on oral bisoprolol Cardizem and clonidine patch. 6.  Hypothyroidism on Synthroid. 7.  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  Anxiety depression on Celexa, BuSpar and Ativan 9 weakness.  Physical therapy did recommend rehab but the patient is going home with the [facility] program and they will set up for a hospital bed and wheelchair at home.  They will bring the patient to their own rehab and set up home health on the weekends.  Case discussed with Dr. Lutricia Feil about this. 10.  Speech therapy did advance the patient's diet to minced food and thin liquids. 11.  Hypernatremia give a fluid bolus of D5W.  Patient now on thin liquids so hopefully she will eat better. 12.  Hypokalemia replace potassium today.  Recheck as outpatient. 13.  Positive blood culture was a skin contaminant.  DISCHARGE CONDITIONS:   Fair  CONSULTS OBTAINED:  Treatment Team:  Nelva Bush, MD Tyler Pita, MD  DRUG ALLERGIES:   Allergies  Allergen Reactions  . Lisinopril Cough    DISCHARGE MEDICATIONS:   Allergies as of 01/03/2019      Reactions   Lisinopril Cough      Medication List    STOP taking these medications   metolazone 2.5 MG tablet Commonly known as: ZAROXOLYN     TAKE these medications   albuterol 108 (90 Base) MCG/ACT inhaler Commonly known as: VENTOLIN HFA Inhale 2 puffs into the lungs every 4 (four) hours as needed for wheezing or shortness of breath.   albuterol (2.5 MG/3ML) 0.083% nebulizer solution Commonly known as: PROVENTIL Take 2.5 mg by nebulization every 6 (six) hours as needed  for wheezing or shortness of breath.   apixaban 5 MG Tabs tablet Commonly known as: ELIQUIS Take 1 tablet (5 mg total) by mouth 2 (two) times daily.   Ayr 0.65 % nasal spray Generic drug: sodium chloride Place 2 sprays into the nose every 2 (two) hours as needed for congestion.   bisoprolol 5 MG tablet Commonly known as: ZEBETA Take 0.5 tablets (2.5 mg total) by mouth 2 (two) times daily.   budesonide-formoterol 160-4.5 MCG/ACT inhaler Commonly known as: SYMBICORT Inhale 2 puffs into the lungs 2 (two) times daily.   busPIRone 10 MG tablet Commonly known as: BUSPAR Take 10 mg by mouth 2 (two) times daily.   Calcium Carbonate-Vitamin D 600-400 MG-UNIT tablet Take 1 tablet by mouth 2 (two) times daily.   citalopram 40 MG tablet Commonly known as: CELEXA Take 40 mg by mouth at bedtime.   cloNIDine 0.3 mg/24hr patch Commonly known as: CATAPRES - Dosed in mg/24 hr Place 1 patch (0.3 mg total) onto the skin once a week. Place one patch on skin every Friday Start taking on: January 06, 2019   diltiazem 300 MG 24 hr capsule Commonly known as: CARDIZEM CD Take 1 capsule (300 mg total) by mouth daily. What changed:   medication strength  how much to take   docusate sodium 100 MG capsule Commonly known as: COLACE Take 100 mg by mouth daily.   famotidine 20 MG tablet Commonly known as: PEPCID Take 1 tablet (20 mg total) by mouth daily.   furosemide 20 MG tablet Commonly known as: LASIX Take 1 tablet (20 mg total) by mouth 2 (two) times daily. What changed:   how much to take  when to take this   levothyroxine 150 MCG tablet Commonly known as: SYNTHROID Take 150 mcg by mouth daily before breakfast.   LORazepam 0.5 MG tablet Commonly known as: ATIVAN Take 0.5 mg by mouth 3 (three) times daily. Take 1 tablet once daily, take 2 tablets at night   lovastatin 10 MG tablet Commonly known as: MEVACOR Take 10 mg by mouth daily.   nystatin 100000 UNIT/ML  suspension Commonly known as: MYCOSTATIN Take 5 mLs (500,000 Units total) by mouth 4 (four) times daily.   potassium chloride SA 20 MEQ tablet Commonly known as: KLOR-CON Take 2 tablets (40 mEq total) by mouth 2 (two) times daily.   QUEtiapine 25 MG tablet Commonly known as: SEROQUEL Take 1 tablet (25 mg total) by mouth at bedtime as needed.   Spiriva Respimat 2.5 MCG/ACT Aers Generic drug: Tiotropium Bromide Monohydrate Inhale 1 puff into the lungs daily.   SYSTANE ULTRA OP Place 1 drop into both eyes every 4 (four) hours as needed (dry eyes).        DISCHARGE INSTRUCTIONS:   Follow-up [facility] program 1 day  If you experience worsening of your admission  symptoms, develop shortness of breath, life threatening emergency, suicidal or homicidal thoughts you must seek medical attention immediately by calling 911 or calling your MD immediately  if symptoms less severe.  You Must read complete instructions/literature along with all the possible adverse reactions/side effects for all the Medicines you take and that have been prescribed to you. Take any new Medicines after you have completely understood and accept all the possible adverse reactions/side effects.   Please note  You were cared for by a hospitalist during your hospital stay. If you have any questions about your discharge medications or the care you received while you were in the hospital after you are discharged, you can call the unit and asked to speak with the hospitalist on call if the hospitalist that took care of you is not available. Once you are discharged, your primary care physician will handle any further medical issues. Please note that NO REFILLS for any discharge medications will be authorized once you are discharged, as it is imperative that you return to your primary care physician (or establish a relationship with a primary care physician if you do not have one) for your aftercare needs so that they can reassess  your need for medications and monitor your lab values.    Today   CHIEF COMPLAINT:   Chief Complaint  Patient presents with  . Shortness of Breath  . Weakness    HISTORY OF PRESENT ILLNESS:  Summer Bradley  is a 73 y.o. female came in with weakness and shortness of breath   VITAL SIGNS:  Blood pressure 130/85, pulse 90, temperature 98.8 F (37.1 C), temperature source Oral, resp. rate 20, height 5\' 3"  (1.6 m), weight 103.9 kg, SpO2 96 %.  I/O:    Intake/Output Summary (Last 24 hours) at 01/03/2019 1110 Last data filed at 01/03/2019 1109 Gross per 24 hour  Intake 905.11 ml  Output 1500 ml  Net -594.89 ml    PHYSICAL EXAMINATION:  GENERAL:  73 y.o.-year-old patient lying in the bed with no acute distress.  EYES: Pupils equal, round, reactive to light and accommodation. No scleral icterus. Extraocular muscles intact.  HEENT: Head atraumatic, normocephalic. Oropharynx and nasopharynx clear.  NECK:  Supple, no jugular venous distention. No thyroid enlargement, no tenderness.  LUNGS: Decreased breath sounds bilaterally, no wheezing, rales,rhonchi or crepitation. No use of accessory muscles of respiration.  CARDIOVASCULAR: S1, S2 irregular regular.  No murmurs, rubs, or gallops.  ABDOMEN: Soft, non-tender, non-distended. Bowel sounds present. No organomegaly or mass.  EXTREMITIES: No pedal edema, cyanosis, or clubbing.  NEUROLOGIC: Cranial nerves II through XII are intact. Muscle strength 4/5 in all extremities. Sensation intact. Gait not checked.  PSYCHIATRIC: The patient is alert and answers questions.  SKIN: Bruising on extremities  DATA REVIEW:   CBC Recent Labs  Lab 01/02/19 0448  WBC 14.5*  HGB 16.2*  HCT 49.5*  PLT 198    Chemistries  Recent Labs  Lab 01/01/19 0152 01/03/19 0430  NA 153* 150*  K 3.3* 3.3*  CL 112* 109  CO2 32 31  GLUCOSE 100* 126*  BUN 34* 30*  CREATININE 0.91 1.01*  CALCIUM 8.8* 8.9  MG 2.8* 2.5*  AST 52*  --   ALT 62*  --    ALKPHOS 43  --   BILITOT 3.1*  --      Microbiology Results  Results for orders placed or performed during the hospital encounter of 12/23/18  SARS CORONAVIRUS 2 (TAT 6-24 HRS) Nasopharyngeal Nasopharyngeal Swab  Status: None   Collection Time: 12/23/18 10:30 PM   Specimen: Nasopharyngeal Swab  Result Value Ref Range Status   SARS Coronavirus 2 NEGATIVE NEGATIVE Final    Comment: (NOTE) SARS-CoV-2 target nucleic acids are NOT DETECTED. The SARS-CoV-2 RNA is generally detectable in upper and lower respiratory specimens during the acute phase of infection. Negative results do not preclude SARS-CoV-2 infection, do not rule out co-infections with other pathogens, and should not be used as the sole basis for treatment or other patient management decisions. Negative results must be combined with clinical observations, patient history, and epidemiological information. The expected result is Negative. Fact Sheet for Patients: SugarRoll.be Fact Sheet for Healthcare Providers: https://www.woods-mathews.com/ This test is not yet approved or cleared by the [place] FDA and  has been authorized for detection and/or diagnosis of SARS-CoV-2 by FDA under an Emergency Use Authorization (EUA). This EUA will remain  in effect (meaning this test can be used) for the duration of the COVID-19 declaration under Section 56 4(b)(1) of the Act, 21 U.S.C. section 360bbb-3(b)(1), unless the authorization is terminated or revoked sooner. Performed at [facility], [place] [address] [place], [place] [address]   Culture, blood (routine x 2) Call MD if unable to obtain prior to antibiotics being given     Status: Abnormal   Collection Time: 12/24/18  1:42 AM   Specimen: BLOOD  Result Value Ref Range Status   Specimen Description   Final    BLOOD RIGHT FOREARM Performed at [facility], [address] [place], [place] [address]    Special  Requests   Final    BOTTLES DRAWN AEROBIC AND ANAEROBIC Blood Culture adequate volume Performed at [facility], [address] [place], [place] [address]    Culture  Setup Time   Final    AEROBIC BOTTLE ONLY GRAM POSITIVE COCCI CRITICAL RESULT CALLED TO, READ BACK BY AND VERIFIED WITH: ROBBINS,J AT 2046 ON 12/24/2018 BY MOSLEY,J GRAM POSITIVE RODS ANAEROBIC BOTTLE ONLY CRITICAL RESULT CALLED TO, READ BACK BY AND VERIFIED WITH: DAVID BESANTI AT [place] ON 12/25/18 RWW    Culture (A)  Final    STAPHYLOCOCCUS SPECIES (COAGULASE NEGATIVE) THE SIGNIFICANCE OF ISOLATING THIS ORGANISM FROM A SINGLE SET OF BLOOD CULTURES WHEN MULTIPLE SETS ARE DRAWN IS UNCERTAIN. PLEASE NOTIFY THE MICROBIOLOGY DEPARTMENT WITHIN ONE WEEK IF SPECIATION AND SENSITIVITIES ARE REQUIRED. DIPHTHEROIDS(CORYNEBACTERIUM SPECIES) Standardized susceptibility testing for this organism is not available. Performed at [facility], [place] [address] [place], [place] [address]    Report Status 12/26/2018 FINAL  Final  Blood Culture ID Panel (Reflexed)     Status: Abnormal   Collection Time: 12/24/18  1:42 AM  Result Value Ref Range Status   Enterococcus species NOT DETECTED NOT DETECTED Final   Listeria monocytogenes NOT DETECTED NOT DETECTED Final   Staphylococcus species DETECTED (A) NOT DETECTED Final    Comment: Methicillin (oxacillin) resistant coagulase negative staphylococcus. Possible blood culture contaminant (unless isolated from more than one blood culture draw or clinical case suggests pathogenicity). No antibiotic treatment is indicated for blood  culture contaminants. CRITICAL RESULT CALLED TO, READ BACK BY AND VERIFIED WITH: ROBBINS,J AT 2046 ON 12/24/2018 BY MOSLEY,J    Staphylococcus aureus (BCID) NOT DETECTED NOT DETECTED Final   Methicillin resistance DETECTED (A) NOT DETECTED Final    Comment: CRITICAL RESULT CALLED TO, READ BACK BY AND VERIFIED WITH: ROBBINS,J AT 2046 ON 12/24/2018 BY  MOSLEY,J    Streptococcus species NOT DETECTED NOT  DETECTED Final   Streptococcus agalactiae NOT DETECTED NOT DETECTED Final   Streptococcus pneumoniae NOT DETECTED NOT DETECTED Final   Streptococcus pyogenes NOT DETECTED NOT DETECTED Final   Acinetobacter baumannii NOT DETECTED NOT DETECTED Final   Enterobacteriaceae species NOT DETECTED NOT DETECTED Final   Enterobacter cloacae complex NOT DETECTED NOT DETECTED Final   Escherichia coli NOT DETECTED NOT DETECTED Final   Klebsiella oxytoca NOT DETECTED NOT DETECTED Final   Klebsiella pneumoniae NOT DETECTED NOT DETECTED Final   Proteus species NOT DETECTED NOT DETECTED Final   Serratia marcescens NOT DETECTED NOT DETECTED Final   Haemophilus influenzae NOT DETECTED NOT DETECTED Final   Neisseria meningitidis NOT DETECTED NOT DETECTED Final   Pseudomonas aeruginosa NOT DETECTED NOT DETECTED Final   Candida albicans NOT DETECTED NOT DETECTED Final   Candida glabrata NOT DETECTED NOT DETECTED Final   Candida krusei NOT DETECTED NOT DETECTED Final   Candida parapsilosis NOT DETECTED NOT DETECTED Final   Candida tropicalis NOT DETECTED NOT DETECTED Final    Comment: Performed at [facility], [address] [place], [place] [address]  Culture, blood (routine x 2)     Status: None   Collection Time: 12/25/18  9:25 AM   Specimen: BLOOD  Result Value Ref Range Status   Specimen Description BLOOD RIGHT ANTECUBITAL  Final   Special Requests   Final    BOTTLES DRAWN AEROBIC AND ANAEROBIC Blood Culture results may not be optimal due to an inadequate volume of blood received in culture bottles   Culture   Final    NO GROWTH 5 DAYS Performed at [facility], [address] [place], [place] [address]    Report Status 12/30/2018 FINAL  Final  Culture, blood (routine x 2)     Status: None   Collection Time: 12/25/18  9:34 AM   Specimen: BLOOD  Result Value Ref Range Status   Specimen Description BLOOD BLOOD RIGHT FOREARM   Final   Special Requests   Final    BOTTLES DRAWN AEROBIC AND ANAEROBIC Blood Culture adequate volume   Culture   Final    NO GROWTH 5 DAYS Performed at [facility], [address] [place], [place] [address]    Report Status 12/30/2018 FINAL  Final  MRSA PCR Screening     Status: None   Collection Time: 12/28/18  6:26 PM   Specimen: Nasal Mucosa; Nasopharyngeal  Result Value Ref Range Status   MRSA by PCR NEGATIVE NEGATIVE Final    Comment:        The GeneXpert MRSA Assay (FDA approved for NASAL specimens only), is one component of a comprehensive MRSA colonization surveillance program. It is not intended to diagnose MRSA infection nor to guide or monitor treatment for MRSA infections. Performed at [facility], [address], [place], [place] [address]       Management plans discussed with the patient, family and they are in agreement.  CODE STATUS:     Code Status Orders  (From admission, onward)         Start     Ordered   12/28/18 1935  Do not attempt resuscitation (DNR)  Continuous    Question Answer Comment  In the event of cardiac or respiratory ARREST Do not call a "code blue"   In the event of cardiac or respiratory ARREST Do not perform Intubation, CPR, defibrillation or ACLS   In the event of cardiac or respiratory ARREST Use medication by any route,  position, wound care, and other measures to relive pain and suffering. May use oxygen, suction and manual treatment of airway obstruction as needed for comfort.      12/28/18 1934        Code Status History    Date Active Date Inactive Code Status Order ID Comments User Context   12/23/2018 2233 12/28/2018 1934 Full Code [id number]  Christel Mormon, MD ED   02/03/2018 0934 02/10/2018 1954 Full Code [id number]  Arta Silence, MD Inpatient   02/02/2014 0933 03/05/2014 1624 Full Code [id number]  Wilhelmina Mcardle, MD Inpatient   02/01/2014 1236 02/02/2014 0933 Full Code [id number]   Rob Hickman, MD Inpatient   01/29/2014 1119 02/01/2014 1236 Full Code [id number]  Roland Rack, MD Inpatient   Advance Care Planning Activity    Advance Directive Documentation     Most Recent Value  Type of Advance Directive  Healthcare Power of Attorney  Pre-existing out of facility DNR order (yellow form or pink MOST form)  -  "MOST" Form in Place?  -      TOTAL TIME TAKING CARE OF THIS PATIENT: 35 minutes.    Loletha Grayer M.D on 01/03/2019 at 11:10 AM  Between 7am to 6pm - Pager - [phone]  After 6pm go to [email or web address] - password [email or web address]  [facility]  CC: Primary care physician; [place]

## 2019-01-03 NOTE — TOC Transition Note (Addendum)
Transition of Care [facility]) - CM/SW Discharge Note   Patient Details  Name: Summer Bradley MRN: [id number] Date of Birth: [birth year]  [facility]) CM/SW Contact:  Ross Ludwig, LCSW Phone Number: 01/03/2019, 10:49 AM   Clinical Narrative:    CSW spoke to [place] program, and they said they will pick patient up once she is medically ready for discharge.  CSW informed them that patient may be ready for discharge today, [facility] said to just let them know once the discharge summary is completed and they will pick patient up around 3pm.  CSW contacted [facility], to inform them that patient's discharge summary is ready and patient is ready for discharge.   Final next level of care: [place] Barriers to Discharge: Barriers Resolved   Patient Goals and CMS Choice Patient states their goals for this hospitalization and ongoing recovery are:: To return back home with the [facility] program. CMS Medicare.gov Compare Post Acute Care list provided to:: Patient Choice offered to / list presented to : [place] / Guardian  Discharge Placement  Patient will be discharging back home with [facility] services.                Discharge Plan and Services In-house Referral: Clinical Social Work   Post Acute Care Choice: Home Health          DME Arranged: N/A DME Agency: NA       HH Arranged: NA          Social Determinants of Health (SDOH) Interventions     Readmission Risk Interventions No flowsheet data found.

## 2019-01-03 NOTE — Progress Notes (Signed)
Speech Language Pathology Treatment: Dysphagia  Patient Details Name: Summer Bradley MRN: [id number] DOB: 04-08-1945 Today's Date: 01/03/2019 Time: 0940-1010 SLP Time Calculation (min) (ACUTE ONLY): 30 min  Assessment / Plan / Recommendation Clinical Impression  Pt seen today for trials to upgrade diet consistency if able -- primarily, trials of thin liquids. Pt wears Dentures which she has not worn since admission -- she will need to "practice" wearing these again and determine IF need for Adhesive to secure them in place for appropriate, safe mastication. [facility] program and Dtr can follow up w/ this at home. Pt has eating the current Pureed diet consistency WELL. Respiratory effort is mildly increased w/ any movement to sit WOB bed for positioning upright for po's. Pt has much declined Pulmonary and Cardiac status' which can impact the timing of airway closure during the pharyngeal swallow thus increasing risk for micro-aspiration. Pt also remains min distracted/Confused w/ Min-Mod verbal/tactile cues needed for follow through w/ instructions, precautions.  Pt consumed trials of Thin liquids via CUP ONLY w/ SLP monitoring and use of general aspiration precautions. No coughing, wet vocal quality, or increased respiratory effort (from her baseline status) was noted during/post po trials of the Thin liquids. Pt instructed to take SMALL, SINGLE SIPS ONLY to reduce risk for aspiration. Intermittent BELCHING was noted b/t sips; throat clearing x1. Pt does have a Mod. Hiatal Hernia so trials were given slowly in small amounts d/t pt just having finished Breakfast meal w/ NA - she ate 50+% it appeared. Recommend strict REFLUX precautions as ANY Esophageal phase Dysmotility can increase risk for Regurgitation of food/liquid material -- this can impact the airway as well.  Recommend diet upgrade to Dysphagia level 1 (puree -- pt is NOT wearing her Dentures at this time but will practice later today) w/ Thin  liquids; aspiration and REFLUX precautions; supervision and support w/ feedings at all meals. Recommend Pills in Puree for safer swallowing. ST services will continue to f/u w/ pt's status next 2-3 days w/ trials to upgrade diet consistency to soft solids (w/ Dentures) as appropriate in line w/ pt's GOC and medical status. Pt has a Palliative Care order. Recommend Dietician f/u.     HPI HPI: Pt is a 73 y.o. Caucasian female with a known history of Multiple medical problems including COPD on 3L home O2, atrial fibrillation, Obesity, hypertension, GERD, Hiatal Hernia, stroke w/ h/o dsyphagia in 2015-2016, and hypothyroidism, who presented to the emergency room with acute onset of worsening dyspnea with associated dry cough, wheezing and generalized weakness, since yesterday.  Chest CTA revealed PE, cardiomegaly with coronary artery disease, moderate size hiatal hernia and cholelithiasis without cholecystitis as well as emphysema and aortic sclerosis.  EKG showed atrial fibrillation with rapid response of 128 with low voltage QRS.  Pt remains on Precedex secondary to delirium and agitation.       SLP Plan  Continue with current plan of care       Recommendations  Diet recommendations: Dysphagia 1 (puree);Thin liquid Liquids provided via: Cup;No straw Medication Administration: Whole meds with puree Supervision: Patient able to self feed;Intermittent supervision to cue for compensatory strategies(for follow through w/ aspiration precautions) Compensations: Minimize environmental distractions;Slow rate;Small sips/bites;Lingual sweep for clearance of pocketing;Multiple dry swallows after each bite/sip;Follow solids with liquid Postural Changes and/or Swallow Maneuvers: Seated upright 90 degrees;Upright 30-60 min after meal                General recommendations: (Dietician f/u) Oral Care Recommendations: Oral care  BID;Staff/trained caregiver to provide oral care Follow up Recommendations: Home  health SLP([facility] program) SLP Visit Diagnosis: Dysphagia, oral phase (R13.11)(Esophageal phase dysphagia) Plan: Continue with current plan of care       [place], [place], CCC-SLP Riese Hellard 01/03/2019, 10:21 AM

## 2019-01-03 NOTE — Care Management Important Message (Signed)
Important Message  Patient Details  Name: Summer Bradley MRN: [id number] Date of Birth: February 28, 1945   Medicare Important Message Given:  Yes     [name] 01/03/2019, 11:29 AM

## 2019-01-03 NOTE — Progress Notes (Signed)
Summer Bradley to be D/C'd Home per MD order.  Discussed prescriptions and follow up appointments with [facility]. Prescriptions electronically submitted. medication list explained in detail. Summer Bradley verbalized understanding.  Allergies as of 01/03/2019      Reactions   Lisinopril Cough      Medication List    STOP taking these medications   metolazone 2.5 MG tablet Commonly known as: ZAROXOLYN     TAKE these medications   albuterol 108 (90 Base) MCG/ACT inhaler Commonly known as: VENTOLIN HFA Inhale 2 puffs into the lungs every 4 (four) hours as needed for wheezing or shortness of breath.   albuterol (2.5 MG/3ML) 0.083% nebulizer solution Commonly known as: PROVENTIL Take 2.5 mg by nebulization every 6 (six) hours as needed for wheezing or shortness of breath.   apixaban 5 MG Tabs tablet Commonly known as: ELIQUIS Take 1 tablet (5 mg total) by mouth 2 (two) times daily.   Ayr 0.65 % nasal spray Generic drug: sodium chloride Place 2 sprays into the nose every 2 (two) hours as needed for congestion.   bisoprolol 5 MG tablet Commonly known as: ZEBETA Take 0.5 tablets (2.5 mg total) by mouth 2 (two) times daily.   budesonide-formoterol 160-4.5 MCG/ACT inhaler Commonly known as: SYMBICORT Inhale 2 puffs into the lungs 2 (two) times daily.   busPIRone 10 MG tablet Commonly known as: BUSPAR Take 10 mg by mouth 2 (two) times daily.   Calcium Carbonate-Vitamin D 600-400 MG-UNIT tablet Take 1 tablet by mouth 2 (two) times daily.   citalopram 40 MG tablet Commonly known as: CELEXA Take 40 mg by mouth at bedtime.   cloNIDine 0.3 mg/24hr patch Commonly known as: CATAPRES - Dosed in mg/24 hr Place 1 patch (0.3 mg total) onto the skin once a week. Place one patch on skin every Friday Start taking on: January 06, 2019   diltiazem 300 MG 24 hr capsule Commonly known as: CARDIZEM CD Take 1 capsule (300 mg total) by mouth daily. What changed:   medication  strength  how much to take   docusate sodium 100 MG capsule Commonly known as: COLACE Take 100 mg by mouth daily.   famotidine 20 MG tablet Commonly known as: PEPCID Take 1 tablet (20 mg total) by mouth daily.   furosemide 20 MG tablet Commonly known as: LASIX Take 1 tablet (20 mg total) by mouth 2 (two) times daily. What changed:   how much to take  when to take this   levothyroxine 150 MCG tablet Commonly known as: SYNTHROID Take 150 mcg by mouth daily before breakfast.   LORazepam 0.5 MG tablet Commonly known as: ATIVAN Take 0.5 mg by mouth 3 (three) times daily. Take 1 tablet once daily, take 2 tablets at night   lovastatin 10 MG tablet Commonly known as: MEVACOR Take 10 mg by mouth daily.   nystatin 100000 UNIT/ML suspension Commonly known as: MYCOSTATIN Take 5 mLs (500,000 Units total) by mouth 4 (four) times daily.   potassium chloride SA 20 MEQ tablet Commonly known as: KLOR-CON Take 2 tablets (40 mEq total) by mouth 2 (two) times daily.   QUEtiapine 25 MG tablet Commonly known as: SEROQUEL Take 1 tablet (25 mg total) by mouth at bedtime as needed.   Spiriva Respimat 2.5 MCG/ACT Aers Generic drug: Tiotropium Bromide Monohydrate Inhale 1 puff into the lungs daily.   SYSTANE ULTRA OP Place 1 drop into both eyes every 4 (four) hours as needed (dry eyes).  Vitals:   01/03/19 1004 01/03/19 1343  BP: 130/85   Bradley: 90   Resp: 20   Temp:    SpO2:  94%    Skin clean, dry and intact without evidence of skin break down, no evidence of skin tears noted. IV catheter discontinued intact. Site without signs and symptoms of complications. Dressing and pressure applied. Pt denies pain at this time. No complaints noted. Upper and lower dentures sent home with patient  An After Visit Summary was printed and given to [name]. Patient escorted via [name], and D/C home via  [name]  [name]

## 2019-04-28 ENCOUNTER — Other Ambulatory Visit: Payer: Self-pay

## 2019-04-28 ENCOUNTER — Emergency Department: Payer: Medicare (Managed Care)

## 2019-04-28 ENCOUNTER — Encounter: Payer: Self-pay | Admitting: Emergency Medicine

## 2019-04-28 ENCOUNTER — Observation Stay
Admission: EM | Admit: 2019-04-28 | Discharge: 2019-04-29 | Disposition: A | Discharge status: Home / Self Care | Payer: Medicare (Managed Care) | Attending: Internal Medicine | Admitting: Internal Medicine

## 2019-04-28 DIAGNOSIS — E119 Type 2 diabetes mellitus without complications: Secondary | ICD-10-CM | POA: Insufficient documentation

## 2019-04-28 DIAGNOSIS — J439 Emphysema, unspecified: Principal | ICD-10-CM | POA: Insufficient documentation

## 2019-04-28 DIAGNOSIS — Z79890 Hormone replacement therapy (postmenopausal): Secondary | ICD-10-CM | POA: Insufficient documentation

## 2019-04-28 DIAGNOSIS — W19XXXA Unspecified fall, initial encounter: Secondary | ICD-10-CM | POA: Diagnosis not present

## 2019-04-28 DIAGNOSIS — Z20822 Contact with and (suspected) exposure to covid-19: Secondary | ICD-10-CM | POA: Diagnosis not present

## 2019-04-28 DIAGNOSIS — E785 Hyperlipidemia, unspecified: Secondary | ICD-10-CM | POA: Insufficient documentation

## 2019-04-28 DIAGNOSIS — I6782 Cerebral ischemia: Secondary | ICD-10-CM | POA: Diagnosis not present

## 2019-04-28 DIAGNOSIS — K219 Gastro-esophageal reflux disease without esophagitis: Secondary | ICD-10-CM | POA: Insufficient documentation

## 2019-04-28 DIAGNOSIS — K8020 Calculus of gallbladder without cholecystitis without obstruction: Secondary | ICD-10-CM | POA: Insufficient documentation

## 2019-04-28 DIAGNOSIS — Z7984 Long term (current) use of oral hypoglycemic drugs: Secondary | ICD-10-CM | POA: Insufficient documentation

## 2019-04-28 DIAGNOSIS — I5032 Chronic diastolic (congestive) heart failure: Secondary | ICD-10-CM | POA: Diagnosis not present

## 2019-04-28 DIAGNOSIS — E039 Hypothyroidism, unspecified: Secondary | ICD-10-CM | POA: Diagnosis not present

## 2019-04-28 DIAGNOSIS — I110 Hypertensive heart disease with heart failure: Secondary | ICD-10-CM | POA: Diagnosis not present

## 2019-04-28 DIAGNOSIS — Z7901 Long term (current) use of anticoagulants: Secondary | ICD-10-CM | POA: Diagnosis not present

## 2019-04-28 DIAGNOSIS — Z7951 Long term (current) use of inhaled steroids: Secondary | ICD-10-CM | POA: Diagnosis not present

## 2019-04-28 DIAGNOSIS — F419 Anxiety disorder, unspecified: Secondary | ICD-10-CM | POA: Insufficient documentation

## 2019-04-28 DIAGNOSIS — Z8673 Personal history of transient ischemic attack (TIA), and cerebral infarction without residual deficits: Secondary | ICD-10-CM | POA: Insufficient documentation

## 2019-04-28 DIAGNOSIS — Z79899 Other long term (current) drug therapy: Secondary | ICD-10-CM | POA: Diagnosis not present

## 2019-04-28 DIAGNOSIS — I480 Paroxysmal atrial fibrillation: Secondary | ICD-10-CM | POA: Diagnosis not present

## 2019-04-28 DIAGNOSIS — Z888 Allergy status to other drugs, medicaments and biological substances status: Secondary | ICD-10-CM | POA: Insufficient documentation

## 2019-04-28 DIAGNOSIS — I4891 Unspecified atrial fibrillation: Secondary | ICD-10-CM

## 2019-04-28 DIAGNOSIS — D72829 Elevated white blood cell count, unspecified: Secondary | ICD-10-CM | POA: Insufficient documentation

## 2019-04-28 DIAGNOSIS — J449 Chronic obstructive pulmonary disease, unspecified: Secondary | ICD-10-CM

## 2019-04-28 DIAGNOSIS — E7800 Pure hypercholesterolemia, unspecified: Secondary | ICD-10-CM | POA: Diagnosis not present

## 2019-04-28 DIAGNOSIS — I272 Pulmonary hypertension, unspecified: Secondary | ICD-10-CM | POA: Diagnosis not present

## 2019-04-28 DIAGNOSIS — I700 Atherosclerosis of aorta: Secondary | ICD-10-CM | POA: Insufficient documentation

## 2019-04-28 DIAGNOSIS — Z66 Do not resuscitate: Secondary | ICD-10-CM | POA: Diagnosis not present

## 2019-04-28 DIAGNOSIS — Z930 Tracheostomy status: Secondary | ICD-10-CM | POA: Insufficient documentation

## 2019-04-28 DIAGNOSIS — F329 Major depressive disorder, single episode, unspecified: Secondary | ICD-10-CM | POA: Diagnosis not present

## 2019-04-28 DIAGNOSIS — Z87891 Personal history of nicotine dependence: Secondary | ICD-10-CM | POA: Insufficient documentation

## 2019-04-28 DIAGNOSIS — Z833 Family history of diabetes mellitus: Secondary | ICD-10-CM | POA: Insufficient documentation

## 2019-04-28 DIAGNOSIS — Z9981 Dependence on supplemental oxygen: Secondary | ICD-10-CM | POA: Insufficient documentation

## 2019-04-28 DIAGNOSIS — J441 Chronic obstructive pulmonary disease with (acute) exacerbation: Secondary | ICD-10-CM

## 2019-04-28 DIAGNOSIS — Z7952 Long term (current) use of systemic steroids: Secondary | ICD-10-CM | POA: Insufficient documentation

## 2019-04-28 LAB — CBC WITH DIFFERENTIAL/PLATELET
Abs Immature Granulocytes: 0.12 10*3/uL — ABNORMAL HIGH (ref 0.00–0.07)
Basophils Absolute: 0.1 10*3/uL (ref 0.0–0.1)
Basophils Relative: 0 %
Eosinophils Absolute: 0 10*3/uL (ref 0.0–0.5)
Eosinophils Relative: 0 %
HCT: 44.3 % (ref 36.0–46.0)
Hemoglobin: 14.2 g/dL (ref 12.0–15.0)
Immature Granulocytes: 1 %
Lymphocytes Relative: 9 %
Lymphs Abs: 1.4 10*3/uL (ref 0.7–4.0)
MCH: 32.6 pg (ref 26.0–34.0)
MCHC: 32.1 g/dL (ref 30.0–36.0)
MCV: 101.6 fL — ABNORMAL HIGH (ref 80.0–100.0)
Monocytes Absolute: 0.6 10*3/uL (ref 0.1–1.0)
Monocytes Relative: 4 %
Neutro Abs: 13.6 10*3/uL — ABNORMAL HIGH (ref 1.7–7.7)
Neutrophils Relative %: 86 %
Platelets: 307 10*3/uL (ref 150–400)
RBC: 4.36 MIL/uL (ref 3.87–5.11)
RDW: 13.5 % (ref 11.5–15.5)
WBC: 15.8 10*3/uL — ABNORMAL HIGH (ref 4.0–10.5)
nRBC: 0 % (ref 0.0–0.2)

## 2019-04-28 LAB — COMPREHENSIVE METABOLIC PANEL
ALT: 17 U/L (ref 0–44)
AST: 28 U/L (ref 15–41)
Albumin: 3.9 g/dL (ref 3.5–5.0)
Alkaline Phosphatase: 40 U/L (ref 38–126)
Anion gap: 12 (ref 5–15)
BUN: 18 mg/dL (ref 8–23)
CO2: 30 mmol/L (ref 22–32)
Calcium: 9.4 mg/dL (ref 8.9–10.3)
Chloride: 101 mmol/L (ref 98–111)
Creatinine, Ser: 1.03 mg/dL — ABNORMAL HIGH (ref 0.44–1.00)
GFR calc Af Amer: >60 mL/min (ref >60)
GFR calc non Af Amer: 54 mL/min — ABNORMAL LOW (ref >60)
Glucose, Bld: 130 mg/dL — ABNORMAL HIGH (ref 70–99)
Potassium: 3.9 mmol/L (ref 3.5–5.1)
Sodium: 143 mmol/L (ref 135–145)
Total Bilirubin: 1.1 mg/dL (ref 0.3–1.2)
Total Protein: 7 g/dL (ref 6.5–8.1)

## 2019-04-28 LAB — URINALYSIS, ROUTINE W REFLEX MICROSCOPIC
Bilirubin Urine: NEGATIVE
Glucose, UA: NEGATIVE mg/dL
Hgb urine dipstick: NEGATIVE
Ketones, ur: NEGATIVE mg/dL
Leukocytes,Ua: NEGATIVE
Nitrite: NEGATIVE
Protein, ur: NEGATIVE mg/dL
Specific Gravity, Urine: 1.011 (ref 1.005–1.030)
pH: 6 (ref 5.0–8.0)

## 2019-04-28 LAB — GLUCOSE, CAPILLARY
Glucose-Capillary: 142 mg/dL — ABNORMAL HIGH (ref 70–99)
Glucose-Capillary: 145 mg/dL — ABNORMAL HIGH (ref 70–99)

## 2019-04-28 LAB — RESPIRATORY PANEL BY RT PCR (FLU A&B, COVID)
Influenza A by PCR: NEGATIVE
Influenza B by PCR: NEGATIVE
SARS Coronavirus 2 by RT PCR: NEGATIVE

## 2019-04-28 LAB — HEMOGLOBIN A1C
Hgb A1c MFr Bld: 5.6 % (ref 4.8–5.6)
Mean Plasma Glucose: 114.02 mg/dL

## 2019-04-28 LAB — TROPONIN I (HIGH SENSITIVITY)
Troponin I (High Sensitivity): 8 ng/L (ref <18)
Troponin I (High Sensitivity): 8 ng/L (ref <18)

## 2019-04-28 LAB — PROCALCITONIN: Procalcitonin: <0.1 ng/mL

## 2019-04-28 LAB — MAGNESIUM: Magnesium: 2 mg/dL (ref 1.7–2.4)

## 2019-04-28 LAB — BRAIN NATRIURETIC PEPTIDE: B Natriuretic Peptide: 378 pg/mL — ABNORMAL HIGH (ref 0.0–100.0)

## 2019-04-28 MED ORDER — LORAZEPAM 0.5 MG PO TABS
0.5000 mg | ORAL_TABLET | Freq: Every day | ORAL | Status: DC
  Administered 2019-04-29: 0.5 mg via ORAL
  Filled 2019-04-28: qty 1

## 2019-04-28 MED ORDER — FLUTICASONE FUROATE-VILANTEROL 200-25 MCG/INH IN AEPB
1.0000 | INHALATION_SPRAY | Freq: Every day | RESPIRATORY_TRACT | Status: DC
  Administered 2019-04-29: 1 via RESPIRATORY_TRACT
  Filled 2019-04-28: qty 28

## 2019-04-28 MED ORDER — LEVOTHYROXINE SODIUM 50 MCG PO TABS
150.0000 ug | ORAL_TABLET | Freq: Every day | ORAL | Status: DC
  Administered 2019-04-29: 150 ug via ORAL
  Filled 2019-04-28: qty 3

## 2019-04-28 MED ORDER — INSULIN ASPART 100 UNIT/ML ~~LOC~~ SOLN: 0.0000 [IU] | Freq: Every day | SUBCUTANEOUS | Status: DC

## 2019-04-28 MED ORDER — PRAVASTATIN SODIUM 10 MG PO TABS
10.0000 mg | ORAL_TABLET | Freq: Every day | ORAL | Status: DC
  Administered 2019-04-28: 10 mg via ORAL
  Filled 2019-04-28 (×2): qty 1

## 2019-04-28 MED ORDER — CITALOPRAM HYDROBROMIDE 20 MG PO TABS
40.0000 mg | ORAL_TABLET | Freq: Every day | ORAL | Status: DC
  Administered 2019-04-28: 40 mg via ORAL
  Filled 2019-04-28: qty 2

## 2019-04-28 MED ORDER — LORAZEPAM 0.5 MG PO TABS
1.0000 mg | ORAL_TABLET | Freq: Every day | ORAL | Status: DC
  Administered 2019-04-28: 1 mg via ORAL
  Filled 2019-04-28: qty 2

## 2019-04-28 MED ORDER — IPRATROPIUM-ALBUTEROL 0.5-2.5 (3) MG/3ML IN SOLN
3.0000 mL | Freq: Once | RESPIRATORY_TRACT | Status: DC
  Filled 2019-04-28: qty 3

## 2019-04-28 MED ORDER — IOHEXOL 350 MG/ML SOLN
75.0000 mL | Freq: Once | INTRAVENOUS | Status: AC | PRN
  Administered 2019-04-28: 75 mL via INTRAVENOUS

## 2019-04-28 MED ORDER — APIXABAN 5 MG PO TABS
5.0000 mg | ORAL_TABLET | Freq: Two times a day (BID) | ORAL | Status: DC
  Administered 2019-04-28 – 2019-04-29 (×2): 5 mg via ORAL
  Filled 2019-04-28 (×2): qty 1

## 2019-04-28 MED ORDER — FUROSEMIDE 40 MG PO TABS
20.0000 mg | ORAL_TABLET | Freq: Two times a day (BID) | ORAL | Status: DC
  Administered 2019-04-28 – 2019-04-29 (×2): 20 mg via ORAL
  Filled 2019-04-28 (×2): qty 1

## 2019-04-28 MED ORDER — DILTIAZEM HCL-DEXTROSE 125-5 MG/125ML-% IV SOLN (PREMIX): 5.0000 mg/h | INTRAVENOUS | Status: DC

## 2019-04-28 MED ORDER — DILTIAZEM HCL 25 MG/5ML IV SOLN
10.0000 mg | Freq: Once | INTRAVENOUS | Status: AC
  Administered 2019-04-28: 10 mg via INTRAVENOUS
  Filled 2019-04-28: qty 5

## 2019-04-28 MED ORDER — DILTIAZEM HCL 25 MG/5ML IV SOLN
5.0000 mg | Freq: Once | INTRAVENOUS | Status: AC
  Administered 2019-04-28: 5 mg via INTRAVENOUS
  Filled 2019-04-28: qty 5

## 2019-04-28 MED ORDER — ACETAMINOPHEN 650 MG RE SUPP: 650.0000 mg | Freq: Four times a day (QID) | RECTAL | Status: DC | PRN

## 2019-04-28 MED ORDER — ACETAMINOPHEN 325 MG PO TABS: 650.0000 mg | ORAL_TABLET | Freq: Four times a day (QID) | ORAL | Status: DC | PRN

## 2019-04-28 MED ORDER — BISOPROLOL FUMARATE 5 MG PO TABS
2.5000 mg | ORAL_TABLET | Freq: Two times a day (BID) | ORAL | Status: DC
  Administered 2019-04-28 – 2019-04-29 (×2): 2.5 mg via ORAL
  Filled 2019-04-28 (×4): qty 0.5

## 2019-04-28 MED ORDER — BUSPIRONE HCL 5 MG PO TABS
15.0000 mg | ORAL_TABLET | Freq: Two times a day (BID) | ORAL | Status: DC
  Administered 2019-04-28 – 2019-04-29 (×2): 15 mg via ORAL
  Filled 2019-04-28 (×4): qty 3

## 2019-04-28 MED ORDER — QUETIAPINE FUMARATE 25 MG PO TABS
25.0000 mg | ORAL_TABLET | Freq: Every day | ORAL | Status: DC
  Administered 2019-04-28: 25 mg via ORAL
  Filled 2019-04-28: qty 1

## 2019-04-28 MED ORDER — POLYETHYLENE GLYCOL 3350 17 G PO PACK: 17.0000 g | PACK | Freq: Every day | ORAL | Status: DC | PRN

## 2019-04-28 MED ORDER — TIOTROPIUM BROMIDE MONOHYDRATE 18 MCG IN CAPS
1.0000 | ORAL_CAPSULE | Freq: Every day | RESPIRATORY_TRACT | Status: DC
  Administered 2019-04-29: 18 ug via RESPIRATORY_TRACT
  Filled 2019-04-28: qty 5

## 2019-04-28 MED ORDER — INSULIN ASPART 100 UNIT/ML ~~LOC~~ SOLN
0.0000 [IU] | Freq: Three times a day (TID) | SUBCUTANEOUS | Status: DC
  Administered 2019-04-28 – 2019-04-29 (×2): 3 [IU] via SUBCUTANEOUS
  Filled 2019-04-28 (×2): qty 1

## 2019-04-28 MED ORDER — METHYLPREDNISOLONE SODIUM SUCC 125 MG IJ SOLR
125.0000 mg | Freq: Once | INTRAMUSCULAR | Status: AC
  Administered 2019-04-28: 125 mg via INTRAVENOUS
  Filled 2019-04-28: qty 2

## 2019-04-28 MED ORDER — DOCUSATE SODIUM 100 MG PO CAPS
100.0000 mg | ORAL_CAPSULE | Freq: Two times a day (BID) | ORAL | Status: DC
  Administered 2019-04-28 – 2019-04-29 (×2): 100 mg via ORAL
  Filled 2019-04-28 (×2): qty 1
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
## 2019-04-28 NOTE — Care Management (Signed)
RN CM: Call from [place] @ [facility] states plan is to discharge home with son however if patient is determined to need more assistance than they can provide-[facility] has started authorization paperowork with [facility]. Daughter, Janace Hoard is primary contact.

## 2019-04-28 NOTE — ED Notes (Signed)
Pt trx to CT.  

## 2019-04-28 NOTE — ED Notes (Signed)
Returned from CT.
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
## 2019-04-28 NOTE — ED Notes (Signed)
Family updated as to patient's status. Spoke with pt's duaghter [name].

## 2019-04-28 NOTE — ED Triage Notes (Signed)
Pt fell this AM, unsure why.  Pt has some labored breathing.  Always SHOB but reports worse over night.  Tachy on arrival, hx of afib.

## 2019-04-28 NOTE — Plan of Care (Signed)
Pt arrived from ED to unit and settled in room. Oriented pt to safety features of the room. Bed low, brakes locked, and call light within reach.  Pt verbalized understanding of how to call for assistance. Dyspnea noted with minimal activity; receiving oxygen 3L/min [place].    Problem: Education: Goal: Knowledge of General Education information will improve Description: Including pain rating scale, medication(s)/side effects and non-pharmacologic comfort measures Outcome: Progressing   Problem: Health Behavior/Discharge Planning: Goal: Ability to manage health-related needs will improve Outcome: Progressing   Problem: Clinical Measurements: Goal: Ability to maintain clinical measurements within normal limits will improve Outcome: Progressing Goal: Will remain free from infection Outcome: Progressing Goal: Diagnostic test results will improve Outcome: Progressing Goal: Respiratory complications will improve Outcome: Progressing Goal: Cardiovascular complication will be avoided Outcome: Progressing   Problem: Activity: Goal: Risk for activity intolerance will decrease Outcome: Progressing   Problem: Nutrition: Goal: Adequate nutrition will be maintained Outcome: Progressing   Problem: Coping: Goal: Level of anxiety will decrease Outcome: Progressing   Problem: Elimination: Goal: Will not experience complications related to bowel motility Outcome: Progressing Goal: Will not experience complications related to urinary retention Outcome: Progressing   Problem: Pain Managment: Goal: General experience of comfort will improve Outcome: Progressing   Problem: Safety: Goal: Ability to remain free from injury will improve Outcome: Progressing   Problem: Skin Integrity: Goal: Risk for impaired skin integrity will decrease Outcome: Progressing   Problem: Education: Goal: Knowledge of disease or condition will improve Outcome: Progressing   Problem: Education: Goal: Knowledge  of disease or condition will improve Outcome: Progressing Goal: Understanding of medication regimen will improve Outcome: Progressing Goal: Individualized Educational Video(s) Outcome: Progressing   Problem: Activity: Goal: Ability to tolerate increased activity will improve Outcome: Progressing   Problem: Cardiac: Goal: Ability to achieve and maintain adequate cardiopulmonary perfusion will improve Outcome: Progressing   Problem: Health Behavior/Discharge Planning: Goal: Ability to safely manage health-related needs after discharge will improve Outcome: Progressing

## 2019-04-29 DIAGNOSIS — I4891 Unspecified atrial fibrillation: Secondary | ICD-10-CM | POA: Diagnosis not present

## 2019-04-29 DIAGNOSIS — J449 Chronic obstructive pulmonary disease, unspecified: Secondary | ICD-10-CM | POA: Diagnosis not present

## 2019-04-29 LAB — CBC
HCT: 43 % (ref 36.0–46.0)
Hemoglobin: 13.7 g/dL (ref 12.0–15.0)
MCH: 32.2 pg (ref 26.0–34.0)
MCHC: 31.9 g/dL (ref 30.0–36.0)
MCV: 101.2 fL — ABNORMAL HIGH (ref 80.0–100.0)
Platelets: 313 10*3/uL (ref 150–400)
RBC: 4.25 MIL/uL (ref 3.87–5.11)
RDW: 13.5 % (ref 11.5–15.5)
WBC: 13 10*3/uL — ABNORMAL HIGH (ref 4.0–10.5)
nRBC: 0 % (ref 0.0–0.2)

## 2019-04-29 LAB — BASIC METABOLIC PANEL
Anion gap: 12 (ref 5–15)
BUN: 22 mg/dL (ref 8–23)
CO2: 29 mmol/L (ref 22–32)
Calcium: 9.1 mg/dL (ref 8.9–10.3)
Chloride: 99 mmol/L (ref 98–111)
Creatinine, Ser: 0.88 mg/dL (ref 0.44–1.00)
GFR calc Af Amer: >60 mL/min (ref >60)
GFR calc non Af Amer: >60 mL/min (ref >60)
Glucose, Bld: 148 mg/dL — ABNORMAL HIGH (ref 70–99)
Potassium: 3.8 mmol/L (ref 3.5–5.1)
Sodium: 140 mmol/L (ref 135–145)

## 2019-04-29 LAB — GLUCOSE, CAPILLARY
Glucose-Capillary: 127 mg/dL — ABNORMAL HIGH (ref 70–99)
Glucose-Capillary: 107 mg/dL — ABNORMAL HIGH (ref 70–99)

## 2019-04-29 LAB — PROCALCITONIN: Procalcitonin: <0.1 ng/mL

## 2019-04-29 MED ORDER — DILTIAZEM HCL ER COATED BEADS 180 MG PO CP24
300.0000 mg | ORAL_CAPSULE | Freq: Every day | ORAL | Status: DC
  Filled 2019-04-29: qty 1

## 2019-04-29 NOTE — Evaluation (Signed)
Occupational Therapy Evaluation Patient Details Name: Summer Bradley MRN: [id number] DOB: 07-23-1945 Today's Date: 04/29/2019    History of Present Illness Summer Bradley is a 74 y.o. female with medical history significant of A. fib on diltiazem and Eliquis, COPD on 3 L at baseline who comes in with fall.  Patient states that she was walking to the bathroom this morning (04/28/19) when she had a mechanical fall.   Clinical Impression   Summer Bradley was seen for OT evaluation this date. Pt previously required assist from personal aids for ADL (bathing/dressing), difficulty with urine/bowel incontinence as of the last ~3 weeks, and was MOD I with rolling walker for functional mobility. Pt living in a one story home with ramped entrance. Pt on 3 liters of O2 at home. Pt reports becoming easily fatigued or out of breath with minimal exertion. Pt currently requires MOD assist due to current functional impairments (See OT Problem List below). Pt educated in energy conservation strategies including pursed lip breathing, activity pacing, home/routines modifications,  AE/DME,  and falls prevention. Pt verbalized understanding and would benefit from additional skilled OT services to maximize recall and carryover of learned techniques and facilitate implementation of learned techniques into daily routines. Upon discharge, recommend [facility] services.      Follow Up Recommendations  Home health OT;Supervision/Assistance - 24 hour    Equipment Recommendations  Other (comment)(Tub Transfer Bench)    Recommendations for Other Services       Precautions / Restrictions Precautions Precautions: Fall Restrictions Weight Bearing Restrictions: No      Mobility Bed Mobility Overal bed mobility: Needs Assistance Bed Mobility: Rolling;Supine to Sit;Sit to Supine Rolling: Supervision   Supine to sit: Mod assist Sit to supine: Mod assist   General bed mobility comments: VCs + SUP to use BLE to scoot rear  higher in bed - requires MOD A after too fatigued to get high enough for safe self-feeding  Transfers Overall transfer level: Needs assistance   Transfers: Sit to/from Stand;Stand Pivot Transfers Sit to Stand: Min assist Stand pivot transfers: Min assist       General transfer comment: Increased WOB and moaning during transfers but pt denies pain stating she "feels off". Pt reported mild dizzyness sup>sit and sit>stand, resolved with rest     Balance Overall balance assessment: Needs assistance Sitting-balance support: Bilateral upper extremity supported;Feet supported Sitting balance-Leahy Scale: Fair Sitting balance - Comments: MIN A to maintain upright posture improving to SBA  Postural control: Left lateral lean Standing balance support: Bilateral upper extremity supported Standing balance-Leahy Scale: Fair                             ADL either performed or assessed with clinical judgement   ADL Overall ADL's : Needs assistance/impaired                                       General ADL Comments: MIN A self-feeding seated in bed - required increased assist from baseline 2/2 body habiuts / abdominal swelling (pt had difficulty reaching upright position even with use of bed controls). MAX A don B socks at bed level. SETUP + CGA face washing seated EOB.      Vision Baseline Vision/History: (Hx of impaired peripheral vision) Vision Assessment?: Vision impaired- to be further tested in functional context Additional Comments: Pt's peripheral vision appears  to cause anxiousness during functional transfers - most notably sit>sup     Perception     Praxis      Pertinent Vitals/Pain Pain Assessment: No/denies pain     Hand Dominance Right   Extremity/Trunk Assessment Upper Extremity Assessment Upper Extremity Assessment: Generalized weakness(Pt is sensitive to temperatures)   Lower Extremity Assessment Lower Extremity Assessment: Generalized  weakness       Communication Communication Communication: Other (comment)(Pt fatigues easilt during conversation)   Cognition Arousal/Alertness: Awake/alert Behavior During Therapy: WFL for tasks assessed/performed Overall Cognitive Status: Within Functional Limits for tasks assessed                                     General Comments  Reclined in bed at start of session: BP 126/98, MAP 107, HR 115, SpO2 97% on 4L [place]. Reclined in bed after sit<>stand t/f: BP 121/90, MAP 99, HR 122, SpO2 98% on 4L [place]    Exercises Exercises: Other exercises Other Exercises Other Exercises: Pt educated re: falls prevention, home/routine modifications, DME recommendations, OT role, PLB Other Exercises: Bed mobility, sup<>sit, sit<>stand, face washing seated EOB, setup and positioning for meal, PLB   Shoulder Instructions      Home Living Family/patient expects to be discharged to:: Private residence Living Arrangements: Children(son) Available Help at Discharge: Family;Personal care attendant;Available 24 hours/day Type of Home: House Home Access: Ramped entrance     Home Layout: One level     Bathroom Shower/Tub: Teacher, early years/pre: Standard     Home Equipment: Environmental consultant - 2 wheels;Cane - single point;Shower seat;Hand held shower head(Pt states has a "3 wheeled walker")          Prior Functioning/Environment Level of Independence: Needs assistance  Gait / Transfers Assistance Needed: RW + O2 at baseline ADL's / Homemaking Assistance Needed: Personal Aids assist with ADLs (dressing, bathing) and son assist c iADLs   Comments: Pt reports she does not drive but her son drives her to grocery store as needed. Pt endorses hx of 3 falls in last 6 months all related to O2 tubing. Pt on 3L [place] at baseline        OT Problem List: Decreased strength;Decreased range of motion;Decreased activity tolerance;Impaired balance (sitting and/or standing);Impaired  vision/perception;Decreased safety awareness;Decreased knowledge of use of DME or AE;Cardiopulmonary status limiting activity;Increased edema      OT Treatment/Interventions: Self-care/ADL training;Therapeutic exercise;Neuromuscular education;Energy conservation;DME and/or AE instruction;Therapeutic activities;Visual/perceptual remediation/compensation;Patient/family education;Balance training    OT Goals(Current goals can be found in the care plan section) Acute Rehab OT Goals Patient Stated Goal: tor eturn to PLOF OT Goal Formulation: With patient Time For Goal Achievement: 05/13/19 Potential to Achieve Goals: Fair ADL Goals Pt Will Perform Grooming: sitting;with supervision Pt Will Perform Lower Body Dressing: with adaptive equipment;sitting/lateral leans;bed level;with mod assist(c LRAD & AE PRN) Pt Will Transfer to Toilet: stand pivot transfer;bedside commode;with min guard assist(c LRAD PRN)  OT Frequency: Min 2X/week   Barriers to D/C: Inaccessible home environment          Co-evaluation              AM-PAC OT "6 Clicks" Daily Activity     Outcome Measure Help from another person eating meals?: A Little Help from another person taking care of personal grooming?: A Little Help from another person toileting, which includes using toliet, bedpan, or urinal?: A Lot Help from another person  bathing (including washing, rinsing, drying)?: A Lot Help from another person to put on and taking off regular upper body clothing?: A Lot Help from another person to put on and taking off regular lower body clothing?: A Lot 6 Click Score: 14   End of Session Equipment Utilized During Treatment: Oxygen(4 L [place])  Activity Tolerance: Patient limited by fatigue Patient left: in bed;with call bell/phone within reach;with family/visitor present(son at bedside)  OT Visit Diagnosis: Unsteadiness on feet (R26.81);Other abnormalities of gait and mobility (R26.89)                Time:  [id number] OT Time Calculation (min): 42 min Charges:  OT General Charges $OT Visit: 1 Visit OT Evaluation $OT Eval Moderate Complexity: 1 Mod OT Treatments $Self Care/Home Management : 23-37 mins  Dessie Coma, M.S. OTR/L  04/29/19, 1:44 PM

## 2019-04-29 NOTE — Plan of Care (Signed)

## 2019-04-29 NOTE — Plan of Care (Signed)
Problem: Education: Goal: Knowledge of General Education information will improve Description: Including pain rating scale, medication(s)/side effects and non-pharmacologic comfort measures 04/29/2019 1237 by Blenda Bridegroom, RN Outcome: Completed/Met 04/29/2019 0806 by Blenda Bridegroom, RN Outcome: Progressing   Problem: Health Behavior/Discharge Planning: Goal: Ability to manage health-related needs will improve 04/29/2019 1237 by Blenda Bridegroom, RN Outcome: Completed/Met 04/29/2019 0806 by Blenda Bridegroom, RN Outcome: Progressing   Problem: Clinical Measurements: Goal: Ability to maintain clinical measurements within normal limits will improve 04/29/2019 1237 by Blenda Bridegroom, RN Outcome: Completed/Met 04/29/2019 0806 by Blenda Bridegroom, RN Outcome: Progressing Goal: Will remain free from infection 04/29/2019 1237 by Blenda Bridegroom, RN Outcome: Completed/Met 04/29/2019 0806 by Blenda Bridegroom, RN Outcome: Progressing Goal: Diagnostic test results will improve 04/29/2019 1237 by Blenda Bridegroom, RN Outcome: Completed/Met 04/29/2019 0806 by Blenda Bridegroom, RN Outcome: Progressing Goal: Respiratory complications will improve 04/29/2019 1237 by Blenda Bridegroom, RN Outcome: Completed/Met 04/29/2019 0806 by Blenda Bridegroom, RN Outcome: Progressing Goal: Cardiovascular complication will be avoided 04/29/2019 1237 by Blenda Bridegroom, RN Outcome: Completed/Met 04/29/2019 0806 by Blenda Bridegroom, RN Outcome: Progressing   Problem: Activity: Goal: Risk for activity intolerance will decrease 04/29/2019 1237 by Blenda Bridegroom, RN Outcome: Completed/Met 04/29/2019 0806 by Blenda Bridegroom, RN Outcome: Progressing   Problem: Nutrition: Goal: Adequate nutrition will be maintained 04/29/2019 1237 by Blenda Bridegroom, RN Outcome: Completed/Met 04/29/2019 0806 by Blenda Bridegroom, RN Outcome: Progressing   Problem: Coping: Goal: Level of anxiety will decrease 04/29/2019 1237 by Blenda Bridegroom, RN Outcome:  Completed/Met 04/29/2019 0806 by Blenda Bridegroom, RN Outcome: Progressing   Problem: Elimination: Goal: Will not experience complications related to bowel motility 04/29/2019 1237 by Blenda Bridegroom, RN Outcome: Completed/Met 04/29/2019 0806 by Blenda Bridegroom, RN Outcome: Progressing Goal: Will not experience complications related to urinary retention 04/29/2019 1237 by Blenda Bridegroom, RN Outcome: Completed/Met 04/29/2019 0806 by Blenda Bridegroom, RN Outcome: Progressing   Problem: Pain Managment: Goal: General experience of comfort will improve 04/29/2019 1237 by Blenda Bridegroom, RN Outcome: Completed/Met 04/29/2019 0806 by Blenda Bridegroom, RN Outcome: Progressing   Problem: Safety: Goal: Ability to remain free from injury will improve 04/29/2019 1237 by Blenda Bridegroom, RN Outcome: Completed/Met 04/29/2019 0806 by Blenda Bridegroom, RN Outcome: Progressing   Problem: Skin Integrity: Goal: Risk for impaired skin integrity will decrease 04/29/2019 1237 by Blenda Bridegroom, RN Outcome: Completed/Met 04/29/2019 0806 by Blenda Bridegroom, RN Outcome: Progressing   Problem: Education: Goal: Knowledge of disease or condition will improve 04/29/2019 1237 by Blenda Bridegroom, RN Outcome: Completed/Met 04/29/2019 0806 by Blenda Bridegroom, RN Outcome: Progressing Goal: Understanding of medication regimen will improve 04/29/2019 1237 by Blenda Bridegroom, RN Outcome: Completed/Met 04/29/2019 0806 by Blenda Bridegroom, RN Outcome: Progressing Goal: Individualized Educational Video(s) 04/29/2019 1237 by Blenda Bridegroom, RN Outcome: Completed/Met 04/29/2019 0806 by Blenda Bridegroom, RN Outcome: Progressing   Problem: Activity: Goal: Ability to tolerate increased activity will improve 04/29/2019 1237 by Blenda Bridegroom, RN Outcome: Completed/Met 04/29/2019 0806 by Blenda Bridegroom, RN Outcome: Progressing   Problem: Cardiac: Goal: Ability to achieve and maintain adequate cardiopulmonary perfusion will  improve 04/29/2019 1237 by Blenda Bridegroom, RN Outcome: Completed/Met 04/29/2019 0806 by Blenda Bridegroom, RN Outcome: Progressing   Problem: Health Behavior/Discharge Planning: Goal: Ability to safely manage health-related needs after discharge will improve 04/29/2019 1237 by Blenda Bridegroom,  RN Outcome: Completed/Met 04/29/2019 0806 by Blenda Bridegroom, RN Outcome: Progressing

## 2019-04-29 NOTE — Care Management Obs Status (Signed)
[facility] NOTIFICATION   Patient Details  Name: Summer Bradley MRN: [id number] Date of Birth: 1946-01-27   Medicare Observation Status Notification Given:  Yes  Spoke with daughter [name] by phone.   Marshell Garfinkel, RN 04/29/2019, 1:24 PM

## 2019-04-29 NOTE — Discharge Summary (Signed)
Physician Discharge Summary  Summer Bradley [id number] DOB: [birth year] DOA: 04/28/2019  PCP: [place] date: 04/28/2019 Discharge date: 04/29/2019  Admitted From:  Disposition:    Recommendations for Outpatient Follow-up:  1. Follow up with PCP in 1-2 weeks 2. Please obtain BMP/CBC in one week 3. Please follow up on the following pending results: None  Home Health:No, [facility] Equipment/Devices: Home oxygen Discharge Condition: Stable but guarded CODE STATUS: DNR Diet recommendation: Heart Healthy / Carb Modified    Brief/Interim Summary: Summer Bradley is a 74 y.o. female with medical history significant of A. fib on diltiazem and Eliquis, COPD on 3 L at baseline who comes in with fall. Patient states that she was walking to the bathroom this morning when she had a mechanical fall. She states she thinks she fell on her oxygen tubing. She states that she has felt a little bit more short of breath over the past few days. She denies any orthopnea or PND.She denies any fevers. She has been taking her Lasix. Does not feel like she has gained weight. Denies any chest pain. Patient has a history of a AAA repair but states that her abdomen has not been hurting recently. Denies loss of consciousness. Denies any urinary symptoms except that she is experiencing urinary and fecal incontinence more often than before.  Denies any dysuria or hematuria.  No constipation.  Her appetite is normal.  Denies any sick contacts. According to patient she feels like that her health is declining and she is feeling worse slowly for the past few months.  Per EMS they frequently get called for her, today they found her heart rate in 150s and transported her to ER.  She was admitted for A. fib with RVR.  Initially treated with Cardizem infusion.  ED provider talked with her cardiologist Dr. Lutricia Feil and according to them she has a baseline heart rate around 100.  Her  heart rate improved to 90s with Cardizem infusion.  Cardizem infusion was converted to her home dose of Cardizem.  And patient was discharged home to follow-up with her cardiologist on Monday.  There was some concern of COPD exacerbation due to her complaint of worsening shortness of breath.  There was no wheezing.  She was given some Solu-Medrol which resulted in leukocytosis.  No obvious source of infection and patient remains afebrile.  Procalcitonin negative.  She will continue with her home oxygen and will follow up with her providers at [facility] for further management.  She has well-controlled diabetes with A1c of 5.6.  She will continue with her home dose of Metformin and follow-up with her primary care physician.  Patient came with mechanical fall.  CT head and cervical spine were without any acute injury.  [facility] and she will continue with that.  She will continue her home meds for anxiety/depression.  Discharge Diagnoses:  Active Problems:   Chronic obstructive pulmonary disease (HCC)   Atrial fibrillation with rapid ventricular response [facility])  Discharge Instructions  Discharge Instructions    Diet - low sodium heart healthy   Complete by: As directed    Discharge instructions   Complete by: As directed    It was pleasure taking care of you. Continue using your home oxygen at 3 to 4 L. Please follow-up with your cardiologist and pulmonologist next week.   Increase activity slowly   Complete by: As directed      Allergies as of 04/29/2019  Reactions   Lisinopril Cough      Medication List    TAKE these medications   albuterol 108 (90 Base) MCG/ACT inhaler Commonly known as: VENTOLIN HFA Inhale 2 puffs into the lungs every 4 (four) hours as needed for wheezing or shortness of breath.   albuterol (2.5 MG/3ML) 0.083% nebulizer solution Commonly known as: PROVENTIL Take 2.5 mg by nebulization every 6 (six) hours as needed for wheezing or  shortness of breath.   apixaban 5 MG Tabs tablet Commonly known as: ELIQUIS Take 1 tablet (5 mg total) by mouth 2 (two) times daily.   Ayr 0.65 % nasal spray Generic drug: sodium chloride Place 2 sprays into the nose every 2 (two) hours as needed for congestion.   bisoprolol 5 MG tablet Commonly known as: ZEBETA Take 0.5 tablets (2.5 mg total) by mouth 2 (two) times daily.   budesonide-formoterol 160-4.5 MCG/ACT inhaler Commonly known as: SYMBICORT Inhale 2 puffs into the lungs 2 (two) times daily.   busPIRone 15 MG tablet Commonly known as: BUSPAR Take 15 mg by mouth 2 (two) times daily.   Calcium Carbonate-Vitamin D 600-400 MG-UNIT tablet Take 1 tablet by mouth 2 (two) times daily.   citalopram 40 MG tablet Commonly known as: CELEXA Take 40 mg by mouth at bedtime.   diltiazem 300 MG 24 hr capsule Commonly known as: CARDIZEM CD Take 1 capsule (300 mg total) by mouth daily.   docusate sodium 100 MG capsule Commonly known as: COLACE Take 100 mg by mouth 2 (two) times daily.   famotidine 20 MG tablet Commonly known as: PEPCID Take 1 tablet (20 mg total) by mouth daily.   furosemide 20 MG tablet Commonly known as: LASIX Take 1 tablet (20 mg total) by mouth 2 (two) times daily. What changed:   how much to take  when to take this   levothyroxine 150 MCG tablet Commonly known as: SYNTHROID Take 150 mcg by mouth daily before breakfast.   LORazepam 0.5 MG tablet Commonly known as: ATIVAN Take 0.5 mg by mouth 3 (three) times daily. Take 1 tablet once daily, take 2 tablets at night   lovastatin 10 MG tablet Commonly known as: MEVACOR Take 10 mg by mouth daily.   metFORMIN 500 MG tablet Commonly known as: GLUCOPHAGE Take 500 mg by mouth 2 (two) times daily with a meal.   metolazone 2.5 MG tablet Commonly known as: ZAROXOLYN Take 2.5 mg by mouth once a week. On Wednesdays with furosemide in the am   nystatin 100000 UNIT/ML suspension Commonly known as:  MYCOSTATIN Take 5 mLs (500,000 Units total) by mouth 4 (four) times daily.   potassium chloride SA 20 MEQ tablet Commonly known as: KLOR-CON Take 2 tablets (40 mEq total) by mouth 2 (two) times daily.   predniSONE 20 MG tablet Commonly known as: DELTASONE Take 20 mg by mouth daily with breakfast. Was on 5mg  daily prior to this   QUEtiapine 25 MG tablet Commonly known as: SEROQUEL Take 1 tablet (25 mg total) by mouth at bedtime as needed.   Spiriva Respimat 2.5 MCG/ACT Aers Generic drug: Tiotropium Bromide Monohydrate Inhale 1 puff into the lungs daily.   SYSTANE ULTRA OP Place 1 drop into both eyes every 4 (four) hours as needed (dry eyes).       Allergies  Allergen Reactions  . Lisinopril Cough    Consultations:  None  Procedures/Studies: CT Head Wo Contrast  Result Date: 04/28/2019 CLINICAL DATA:  Poly trauma. Patient fell. EXAM: CT HEAD  WITHOUT CONTRAST CT CERVICAL SPINE WITHOUT CONTRAST TECHNIQUE: Multidetector CT imaging of the head and cervical spine was performed following the standard protocol without intravenous contrast. Multiplanar CT image reconstructions of the cervical spine were also generated. 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 There is no evidence for acute hemorrhage, hydrocephalus, mass lesion, or abnormal extra-axial fluid collection. No definite CT evidence for acute infarction. Patchy low attenuation in the deep hemispheric and periventricular white matter is nonspecific, but likely reflects chronic microvascular ischemic demyelination. Vascular: No hyperdense vessel or unexpected calcification. Skull: No evidence for fracture. No worrisome lytic or sclerotic lesion. Sinuses/Orbits: The visualized paranasal sinuses and mastoid air cells are clear. Visualized portions of the globes and intraorbital fat are unremarkable. Other: None. CT CERVICAL SPINE FINDINGS Alignment:  Straightening of normal cervical lordosis without subluxation. Skull base and vertebrae: No acute fracture. No primary bone lesion or focal pathologic process. Soft tissues and spinal canal: No prevertebral fluid or swelling. No visible canal hematoma. Disc levels: Diffuse loss of intervertebral disc height evident, most prominent at C5-6, C6-7, and C7-T1. Upper chest: Emphysema noted in the lung apices. Other: None. IMPRESSION: 1. No acute intracranial abnormality. Atrophy with chronic small vessel white matter ischemic disease. 2. Old right MCA territory infarct. 3. Degenerative changes in the cervical spine without fracture. Electronically Signed   By: Misty Stanley M.D.   On: 04/28/2019 11:03   CT Angio Chest PE W and/or Wo Contrast  Result Date: 04/28/2019 CLINICAL DATA:  Shortness of breath EXAM: CT ANGIOGRAPHY CHEST WITH CONTRAST TECHNIQUE: Multidetector CT imaging of the chest was performed using the standard protocol during bolus administration of intravenous contrast. Multiplanar CT image reconstructions and MIPs were obtained to evaluate the vascular anatomy. CONTRAST:  95mL OMNIPAQUE IOHEXOL 350 MG/ML SOLN COMPARISON:  12/23/2018 FINDINGS: Cardiovascular: Thoracic aorta demonstrates diffuse atherosclerotic calcifications. Coronary calcifications are noted. No cardiac enlargement is seen. The pulmonary artery shows a normal branching pattern without intraluminal filling defect to suggest pulmonary embolism. Mediastinum/Nodes: Thoracic inlet is within normal limits. No hilar or mediastinal adenopathy is noted. The esophagus as visualized is within normal limits. Lungs/Pleura: The lungs are well aerated bilaterally. Mild emphysematous changes are again identified nodular scarring is seen in the right upper lobe stable from the prior exam. Partially calcified granuloma is noted right upper lobe anteriorly. No sizable effusion or focal infiltrate is noted. Upper Abdomen: Visualized upper abdomen shows  multiple gallstones. No complicating factors are seen. Nonobstructing left renal stone is noted. Musculoskeletal: Degenerative changes of the thoracic spine are seen. Old rib fractures are noted on the right. Review of the MIP images confirms the above findings. IMPRESSION: No evidence of pulmonary emboli. Emphysematous changes similar to that seen on the prior exam. Cholelithiasis. Changes of prior granulomatous disease. Aortic Atherosclerosis (ICD10-I70.0) and Emphysema (ICD10-J43.9). Electronically Signed   By: Inez Catalina M.D.   On: 04/28/2019 16:01   CT Cervical Spine Wo Contrast  Result Date: 04/28/2019 CLINICAL DATA:  Poly trauma. Patient fell. EXAM: CT HEAD WITHOUT CONTRAST CT CERVICAL SPINE WITHOUT CONTRAST TECHNIQUE: Multidetector CT imaging of the head and cervical spine was performed following the standard protocol without intravenous contrast. Multiplanar CT image reconstructions of the cervical spine were also generated. 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 There is no evidence for acute hemorrhage, hydrocephalus, mass lesion, or abnormal extra-axial fluid collection. No definite CT  evidence for acute infarction. Patchy low attenuation in the deep hemispheric and periventricular white matter is nonspecific, but likely reflects chronic microvascular ischemic demyelination. Vascular: No hyperdense vessel or unexpected calcification. Skull: No evidence for fracture. No worrisome lytic or sclerotic lesion. Sinuses/Orbits: The visualized paranasal sinuses and mastoid air cells are clear. Visualized portions of the globes and intraorbital fat are unremarkable. Other: None. CT CERVICAL SPINE FINDINGS Alignment: Straightening of normal cervical lordosis without subluxation. Skull base and vertebrae: No acute fracture. No primary bone lesion or focal pathologic process. Soft tissues and spinal canal: No prevertebral fluid  or swelling. No visible canal hematoma. Disc levels: Diffuse loss of intervertebral disc height evident, most prominent at C5-6, C6-7, and C7-T1. Upper chest: Emphysema noted in the lung apices. Other: None. IMPRESSION: 1. No acute intracranial abnormality. Atrophy with chronic small vessel white matter ischemic disease. 2. Old right MCA territory infarct. 3. Degenerative changes in the cervical spine without fracture. Electronically Signed   By: Misty Stanley M.D.   On: 04/28/2019 11:03   DG Chest Portable 1 View  Result Date: 04/28/2019 CLINICAL DATA:  Fall, labored breathing, chronic shortness of breath EXAM: PORTABLE CHEST 1 VIEW COMPARISON:  12/31/2018 FINDINGS: Cardiomegaly. Emphysema. The visualized skeletal structures are unremarkable. IMPRESSION: 1.  Emphysema without acute abnormality of the lungs. 2.  Cardiomegaly. Electronically Signed   By: Eddie Candle M.D.   On: 04/28/2019 10:45    Subjective: Patient was feeling better when seen during morning rounds.  She was feeling back to her baseline.  Although still little short of breath.  She wants to go home and will follow up with [facility] providers at [facility] on [facility].  Discharge Exam: Vitals:   04/29/19 1100 04/29/19 1206  BP:  (!) 126/98  Pulse:  97  Resp: (!) 25 16  Temp:  97.8 F (36.6 C)  SpO2:  97%   Vitals:   04/29/19 0900 04/29/19 1000 04/29/19 1100 04/29/19 1206  BP:    (!) 126/98  Pulse:    97  Resp: (!) 23 (!) 28 (!) 25 16  Temp:    97.8 F (36.6 C)  TempSrc:      SpO2:    97%  Weight:      Height:        General: Pt is alert, awake, not in acute distress Cardiovascular: Irregularly irregular, no rubs, no gallops Respiratory: CTA bilaterally, no wheezing, no rhonchi Abdominal: Soft, NT, ND, bowel sounds + Extremities: no edema, no cyanosis   The results of significant diagnostics from this hospitalization (including imaging, microbiology, ancillary and laboratory) are listed below for reference.     Microbiology: Recent Results (from the past 240 hour(s))  Respiratory Panel by RT PCR (Flu A&B, Covid) - Nasopharyngeal Swab     Status: None   Collection Time: 04/28/19 10:18 AM   Specimen: Nasopharyngeal Swab  Result Value Ref Range Status   SARS Coronavirus 2 by RT PCR NEGATIVE NEGATIVE Final    Comment: (NOTE) SARS-CoV-2 target nucleic acids are NOT DETECTED. The SARS-CoV-2 RNA is generally detectable in upper respiratoy specimens during the acute phase of infection. The lowest concentration of SARS-CoV-2 viral copies this assay can detect is 131 copies/mL. A negative result does not preclude SARS-Cov-2 infection and should not be used as the sole basis for treatment or other patient management decisions. A negative result may occur with  improper specimen collection/handling, submission of specimen other than nasopharyngeal swab, presence of viral mutation(s) within the areas targeted by  this assay, and inadequate number of viral copies (<131 copies/mL). A negative result must be combined with clinical observations, patient history, and epidemiological information. The expected result is Negative. Fact Sheet for Patients:  PinkCheek.be Fact Sheet for Healthcare Providers:  GravelBags.it This test is not yet ap proved or cleared by the [place] FDA and  has been authorized for detection and/or diagnosis of SARS-CoV-2 by FDA under an Emergency Use Authorization (EUA). This EUA will remain  in effect (meaning this test can be used) for the duration of the COVID-19 declaration under Section 564(b)(1) of the Act, 21 U.S.C. section 360bbb-3(b)(1), unless the authorization is terminated or revoked sooner.    Influenza A by PCR NEGATIVE NEGATIVE Final   Influenza B by PCR NEGATIVE NEGATIVE Final    Comment: (NOTE) The Xpert Xpress SARS-CoV-2/FLU/RSV assay is intended as an aid in  the diagnosis of influenza from  Nasopharyngeal swab specimens and  should not be used as a sole basis for treatment. Nasal washings and  aspirates are unacceptable for Xpert Xpress SARS-CoV-2/FLU/RSV  testing. Fact Sheet for Patients: PinkCheek.be Fact Sheet for Healthcare Providers: GravelBags.it This test is not yet approved or cleared by the [place] FDA and  has been authorized for detection and/or diagnosis of SARS-CoV-2 by  FDA under an Emergency Use Authorization (EUA). This EUA will remain  in effect (meaning this test can be used) for the duration of the  Covid-19 declaration under Section 564(b)(1) of the Act, 21  U.S.C. section 360bbb-3(b)(1), unless the authorization is  terminated or revoked. Performed at [facility], [address] [place],  [address]      Labs: BNP (last 3 results) Recent Labs    12/23/18 1340 12/29/18 0521 04/28/19 1015  BNP 269.0* 518.0* 123456*   Basic Metabolic Panel: Recent Labs  Lab 04/28/19 1015 04/29/19 0503  NA 143 140  K 3.9 3.8  CL 101 99  CO2 30 29  GLUCOSE 130* 148*  BUN 18 22  CREATININE 1.03* 0.88  CALCIUM 9.4 9.1  MG 2.0  --    Liver Function Tests: Recent Labs  Lab 04/28/19 1015  AST 28  ALT 17  ALKPHOS 40  BILITOT 1.1  PROT 7.0  ALBUMIN 3.9   No results for input(s): LIPASE, AMYLASE in the last 168 hours. No results for input(s): AMMONIA in the last 168 hours. CBC: Recent Labs  Lab 04/28/19 1015 04/29/19 0503  WBC 15.8* 13.0*  NEUTROABS 13.6*  --   HGB 14.2 13.7  HCT 44.3 43.0  MCV 101.6* 101.2*  PLT 307 313   Cardiac Enzymes: No results for input(s): CKTOTAL, CKMB, CKMBINDEX, TROPONINI in the last 168 hours. BNP: Invalid input(s): POCBNP CBG: Recent Labs  Lab 04/28/19 1711 04/28/19 2143 04/29/19 0738 04/29/19 1206  GLUCAP 142* 145* 127* 107*   D-Dimer No results for input(s): DDIMER in the last 72 hours. Hgb A1c Recent Labs     04/28/19 1900  HGBA1C 5.6   Lipid Profile No results for input(s): CHOL, HDL, LDLCALC, TRIG, CHOLHDL, LDLDIRECT in the last 72 hours. Thyroid function studies No results for input(s): TSH, T4TOTAL, T3FREE, THYROIDAB in the last 72 hours.  Invalid input(s): FREET3 Anemia work up No results for input(s): VITAMINB12, FOLATE, FERRITIN, TIBC, IRON, RETICCTPCT in the last 72 hours. Urinalysis    Component Value Date/Time   COLORURINE YELLOW (A) 04/28/2019 1320   APPEARANCEUR CLEAR (A) 04/28/2019 1320   APPEARANCEUR Clear 01/10/2014 1025   LABSPEC 1.011 04/28/2019 1320  LABSPEC 1.006 01/10/2014 1025   PHURINE 6.0 04/28/2019 1320   GLUCOSEU NEGATIVE 04/28/2019 1320   GLUCOSEU Negative 01/10/2014 1025   HGBUR NEGATIVE 04/28/2019 1320   BILIRUBINUR NEGATIVE 04/28/2019 1320   BILIRUBINUR Negative 01/10/2014 1025   KETONESUR NEGATIVE 04/28/2019 1320   PROTEINUR NEGATIVE 04/28/2019 1320   UROBILINOGEN 2.0 (H) 02/19/2014 2336   NITRITE NEGATIVE 04/28/2019 1320   LEUKOCYTESUR NEGATIVE 04/28/2019 1320   LEUKOCYTESUR 1+ 01/10/2014 1025   Sepsis Labs Invalid input(s): PROCALCITONIN,  WBC,  LACTICIDVEN Microbiology Recent Results (from the past 240 hour(s))  Respiratory Panel by RT PCR (Flu A&B, Covid) - Nasopharyngeal Swab     Status: None   Collection Time: 04/28/19 10:18 AM   Specimen: Nasopharyngeal Swab  Result Value Ref Range Status   SARS Coronavirus 2 by RT PCR NEGATIVE NEGATIVE Final    Comment: (NOTE) SARS-CoV-2 target nucleic acids are NOT DETECTED. The SARS-CoV-2 RNA is generally detectable in upper respiratoy specimens during the acute phase of infection. The lowest concentration of SARS-CoV-2 viral copies this assay can detect is 131 copies/mL. A negative result does not preclude SARS-Cov-2 infection and should not be used as the sole basis for treatment or other patient management decisions. A negative result may occur with  improper specimen collection/handling,  submission of specimen other than nasopharyngeal swab, presence of viral mutation(s) within the areas targeted by this assay, and inadequate number of viral copies (<131 copies/mL). A negative result must be combined with clinical observations, patient history, and epidemiological information. The expected result is Negative. Fact Sheet for Patients:  PinkCheek.be Fact Sheet for Healthcare Providers:  GravelBags.it This test is not yet ap proved or cleared by the [place] FDA and  has been authorized for detection and/or diagnosis of SARS-CoV-2 by FDA under an Emergency Use Authorization (EUA). This EUA will remain  in effect (meaning this test can be used) for the duration of the COVID-19 declaration under Section 564(b)(1) of the Act, 21 U.S.C. section 360bbb-3(b)(1), unless the authorization is terminated or revoked sooner.    Influenza A by PCR NEGATIVE NEGATIVE Final   Influenza B by PCR NEGATIVE NEGATIVE Final    Comment: (NOTE) The Xpert Xpress SARS-CoV-2/FLU/RSV assay is intended as an aid in  the diagnosis of influenza from Nasopharyngeal swab specimens and  should not be used as a sole basis for treatment. Nasal washings and  aspirates are unacceptable for Xpert Xpress SARS-CoV-2/FLU/RSV  testing. Fact Sheet for Patients: PinkCheek.be Fact Sheet for Healthcare Providers: GravelBags.it This test is not yet approved or cleared by the [place] FDA and  has been authorized for detection and/or diagnosis of SARS-CoV-2 by  FDA under an Emergency Use Authorization (EUA). This EUA will remain  in effect (meaning this test can be used) for the duration of the  Covid-19 declaration under Section 564(b)(1) of the Act, 21  U.S.C. section 360bbb-3(b)(1), unless the authorization is  terminated or revoked. Performed at [facility], [address] [place], [place] [address]     Time coordinating discharge: Over 30 minutes  SIGNED:  Lorella Nimrod, MD  [facility] 04/29/2019, 12:23 PM  If 7PM-7AM, please contact night-coverage [email or web address]  This record has been created using Systems analyst. Errors have been sought and corrected,but may not always be located. Such creation errors do not reflect on the standard of care.

## 2019-04-29 NOTE — Progress Notes (Signed)
The nurse reviewed discharge paperwork with the patient.  The patient had no questions or concerns regarding discharge paperwork.

## 2019-04-29 NOTE — TOC Transition Note (Addendum)
Transition of Care [facility]) - CM/SW Discharge Note   Patient Details  Name: Summer Bradley MRN: [id number] Date of Birth: 21-Sep-1945  [facility] CM/SW Contact:  Marshell Garfinkel, RN Phone Number: 04/29/2019, 1:06 PM   Clinical Narrative:     Spoke with RN with [facility] [name] and patient has home O2. She is planning to go to a family members address at discharge. Spoke with daugter [name] patient will be staying S/S with son [name] [address] [place] [id number]; M[place]F [place] [id number]. They will manage her O2. [name] updated with [facility].  Final next level of care: Home/Self Care Barriers to Discharge: No Barriers Identified   Patient Goals and CMS Choice Patient states their goals for this hospitalization and ongoing recovery are:: [facility] manages      Discharge Placement                       Discharge Plan and Services                                     Social Determinants of Health (SDOH) Interventions     Readmission Risk Interventions No flowsheet data found.

## 2019-08-13 ENCOUNTER — Other Ambulatory Visit: Payer: Self-pay

## 2019-08-13 ENCOUNTER — Inpatient Hospital Stay
Admission: EM | Admit: 2019-08-13 | Discharge: 2019-08-17 | DRG: 190 | Disposition: A | Discharge status: Home Health | Payer: Medicare (Managed Care) | Attending: Internal Medicine | Admitting: Internal Medicine

## 2019-08-13 ENCOUNTER — Emergency Department: Payer: Medicare (Managed Care)

## 2019-08-13 DIAGNOSIS — Z87891 Personal history of nicotine dependence: Secondary | ICD-10-CM

## 2019-08-13 DIAGNOSIS — E669 Obesity, unspecified: Secondary | ICD-10-CM | POA: Diagnosis present

## 2019-08-13 DIAGNOSIS — E039 Hypothyroidism, unspecified: Secondary | ICD-10-CM | POA: Diagnosis present

## 2019-08-13 DIAGNOSIS — Z8673 Personal history of transient ischemic attack (TIA), and cerebral infarction without residual deficits: Secondary | ICD-10-CM

## 2019-08-13 DIAGNOSIS — R0603 Acute respiratory distress: Secondary | ICD-10-CM | POA: Diagnosis not present

## 2019-08-13 DIAGNOSIS — Z7984 Long term (current) use of oral hypoglycemic drugs: Secondary | ICD-10-CM

## 2019-08-13 DIAGNOSIS — J439 Emphysema, unspecified: Secondary | ICD-10-CM | POA: Diagnosis not present

## 2019-08-13 DIAGNOSIS — I5032 Chronic diastolic (congestive) heart failure: Secondary | ICD-10-CM | POA: Diagnosis present

## 2019-08-13 DIAGNOSIS — J9621 Acute and chronic respiratory failure with hypoxia: Secondary | ICD-10-CM | POA: Diagnosis not present

## 2019-08-13 DIAGNOSIS — Z79899 Other long term (current) drug therapy: Secondary | ICD-10-CM

## 2019-08-13 DIAGNOSIS — E119 Type 2 diabetes mellitus without complications: Secondary | ICD-10-CM | POA: Diagnosis present

## 2019-08-13 DIAGNOSIS — Z6834 Body mass index (BMI) 34.0-34.9, adult: Secondary | ICD-10-CM

## 2019-08-13 DIAGNOSIS — I480 Paroxysmal atrial fibrillation: Secondary | ICD-10-CM | POA: Diagnosis present

## 2019-08-13 DIAGNOSIS — E785 Hyperlipidemia, unspecified: Secondary | ICD-10-CM | POA: Diagnosis present

## 2019-08-13 DIAGNOSIS — Z7952 Long term (current) use of systemic steroids: Secondary | ICD-10-CM

## 2019-08-13 DIAGNOSIS — Z833 Family history of diabetes mellitus: Secondary | ICD-10-CM

## 2019-08-13 DIAGNOSIS — Z79890 Hormone replacement therapy (postmenopausal): Secondary | ICD-10-CM

## 2019-08-13 DIAGNOSIS — Z66 Do not resuscitate: Secondary | ICD-10-CM | POA: Diagnosis present

## 2019-08-13 DIAGNOSIS — J441 Chronic obstructive pulmonary disease with (acute) exacerbation: Secondary | ICD-10-CM

## 2019-08-13 DIAGNOSIS — D72829 Elevated white blood cell count, unspecified: Secondary | ICD-10-CM | POA: Diagnosis present

## 2019-08-13 DIAGNOSIS — Z9981 Dependence on supplemental oxygen: Secondary | ICD-10-CM

## 2019-08-13 DIAGNOSIS — F329 Major depressive disorder, single episode, unspecified: Secondary | ICD-10-CM | POA: Diagnosis present

## 2019-08-13 DIAGNOSIS — Z7951 Long term (current) use of inhaled steroids: Secondary | ICD-10-CM

## 2019-08-13 DIAGNOSIS — Z7901 Long term (current) use of anticoagulants: Secondary | ICD-10-CM

## 2019-08-13 DIAGNOSIS — I110 Hypertensive heart disease with heart failure: Secondary | ICD-10-CM | POA: Diagnosis present

## 2019-08-13 DIAGNOSIS — F419 Anxiety disorder, unspecified: Secondary | ICD-10-CM | POA: Diagnosis present

## 2019-08-13 DIAGNOSIS — I10 Essential (primary) hypertension: Secondary | ICD-10-CM | POA: Diagnosis present

## 2019-08-13 DIAGNOSIS — Z20822 Contact with and (suspected) exposure to covid-19: Secondary | ICD-10-CM | POA: Diagnosis present

## 2019-08-13 LAB — CBC WITH DIFFERENTIAL/PLATELET
Abs Immature Granulocytes: 0.23 10*3/uL — ABNORMAL HIGH (ref 0.00–0.07)
Basophils Absolute: 0.1 10*3/uL (ref 0.0–0.1)
Basophils Relative: 0 %
Eosinophils Absolute: 0.1 10*3/uL (ref 0.0–0.5)
Eosinophils Relative: 0 %
HCT: 42.6 % (ref 36.0–46.0)
Hemoglobin: 13.8 g/dL (ref 12.0–15.0)
Immature Granulocytes: 1 %
Lymphocytes Relative: 10 %
Lymphs Abs: 2.9 10*3/uL (ref 0.7–4.0)
MCH: 31.2 pg (ref 26.0–34.0)
MCHC: 32.4 g/dL (ref 30.0–36.0)
MCV: 96.2 fL (ref 80.0–100.0)
Monocytes Absolute: 1.5 10*3/uL — ABNORMAL HIGH (ref 0.1–1.0)
Monocytes Relative: 5 %
Neutro Abs: 23.8 10*3/uL — ABNORMAL HIGH (ref 1.7–7.7)
Neutrophils Relative %: 84 %
Platelets: 313 10*3/uL (ref 150–400)
RBC: 4.43 MIL/uL (ref 3.87–5.11)
RDW: 15 % (ref 11.5–15.5)
WBC: 28.5 10*3/uL — ABNORMAL HIGH (ref 4.0–10.5)
nRBC: 0 % (ref 0.0–0.2)

## 2019-08-13 LAB — BASIC METABOLIC PANEL
Anion gap: 12 (ref 5–15)
BUN: 19 mg/dL (ref 8–23)
CO2: 28 mmol/L (ref 22–32)
Calcium: 9.4 mg/dL (ref 8.9–10.3)
Chloride: 101 mmol/L (ref 98–111)
Creatinine, Ser: 1.12 mg/dL — ABNORMAL HIGH (ref 0.44–1.00)
GFR calc Af Amer: 56 mL/min — ABNORMAL LOW (ref >60)
GFR calc non Af Amer: 48 mL/min — ABNORMAL LOW (ref >60)
Glucose, Bld: 122 mg/dL — ABNORMAL HIGH (ref 70–99)
Potassium: 5 mmol/L (ref 3.5–5.1)
Sodium: 141 mmol/L (ref 135–145)

## 2019-08-13 LAB — BLOOD GAS, VENOUS
Acid-Base Excess: 8.7 mmol/L — ABNORMAL HIGH (ref 0.0–2.0)
Bicarbonate: 35 mmol/L — ABNORMAL HIGH (ref 20.0–28.0)
Delivery systems: POSITIVE
FIO2: 40
O2 Saturation: 87.7 %
pCO2, Ven: 54 mmHg (ref 44.0–60.0)
pH, Ven: 7.42 (ref 7.250–7.430)
pO2, Ven: 53 mmHg — ABNORMAL HIGH (ref 32.0–45.0)

## 2019-08-13 LAB — BRAIN NATRIURETIC PEPTIDE: B Natriuretic Peptide: 300.8 pg/mL — ABNORMAL HIGH (ref 0.0–100.0)

## 2019-08-13 LAB — LACTIC ACID, PLASMA: Lactic Acid, Venous: 2.6 mmol/L (ref 0.5–1.9)

## 2019-08-13 LAB — TROPONIN I (HIGH SENSITIVITY): Troponin I (High Sensitivity): 9 ng/L (ref <18)

## 2019-08-13 MED ORDER — DILTIAZEM HCL 25 MG/5ML IV SOLN
10.0000 mg | Freq: Once | INTRAVENOUS | Status: AC
  Administered 2019-08-13: 10 mg via INTRAVENOUS
  Filled 2019-08-13: qty 5

## 2019-08-13 MED ORDER — IPRATROPIUM-ALBUTEROL 0.5-2.5 (3) MG/3ML IN SOLN
3.0000 mL | Freq: Once | RESPIRATORY_TRACT | Status: AC
  Administered 2019-08-13: 3 mL via RESPIRATORY_TRACT

## 2019-08-13 MED ORDER — METHYLPREDNISOLONE SODIUM SUCC 125 MG IJ SOLR: 125.0000 mg | Freq: Once | INTRAMUSCULAR | Status: AC

## 2019-08-13 MED ORDER — SODIUM CHLORIDE 0.9 % IV SOLN
1.0000 g | Freq: Once | INTRAVENOUS | Status: AC
  Administered 2019-08-13: 1 g via INTRAVENOUS
  Filled 2019-08-13: qty 10

## 2019-08-13 MED ORDER — SODIUM CHLORIDE 0.9 % IV SOLN
500.0000 mg | Freq: Once | INTRAVENOUS | Status: AC
  Administered 2019-08-13: 500 mg via INTRAVENOUS
  Filled 2019-08-13: qty 500

## 2019-08-13 MED ORDER — METHYLPREDNISOLONE SODIUM SUCC 125 MG IJ SOLR
INTRAMUSCULAR | Status: AC
  Administered 2019-08-13: 125 mg via INTRAVENOUS
  Filled 2019-08-13: qty 2
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
## 2019-08-13 NOTE — ED Notes (Signed)
Pt reports feeling better after solumedrol and bipap.

## 2019-08-13 NOTE — ED Notes (Signed)
Daughter at bedside and both she and pt updated on pt status and plan of care.

## 2019-08-13 NOTE — ED Triage Notes (Signed)
Pt comes from home via [facility] due to difficulty breathing and shortness of breathing that started around 2135. EMS reports that on arrival pt had oxygen sats of 82% on her home O2, placed on NRB and sats increased to 90%, pt then placed on CPAP with EMS. EMS reports improvement on CPAP PTA.   On arrival pt is SOB and follows commands.
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
## 2019-08-14 ENCOUNTER — Encounter: Payer: Self-pay | Admitting: Internal Medicine

## 2019-08-14 DIAGNOSIS — Z7952 Long term (current) use of systemic steroids: Secondary | ICD-10-CM | POA: Diagnosis not present

## 2019-08-14 DIAGNOSIS — Z7901 Long term (current) use of anticoagulants: Secondary | ICD-10-CM | POA: Diagnosis not present

## 2019-08-14 DIAGNOSIS — Z8673 Personal history of transient ischemic attack (TIA), and cerebral infarction without residual deficits: Secondary | ICD-10-CM | POA: Diagnosis not present

## 2019-08-14 DIAGNOSIS — J441 Chronic obstructive pulmonary disease with (acute) exacerbation: Secondary | ICD-10-CM | POA: Diagnosis not present

## 2019-08-14 DIAGNOSIS — Z833 Family history of diabetes mellitus: Secondary | ICD-10-CM | POA: Diagnosis not present

## 2019-08-14 DIAGNOSIS — Z66 Do not resuscitate: Secondary | ICD-10-CM | POA: Diagnosis present

## 2019-08-14 DIAGNOSIS — E669 Obesity, unspecified: Secondary | ICD-10-CM | POA: Diagnosis present

## 2019-08-14 DIAGNOSIS — J9621 Acute and chronic respiratory failure with hypoxia: Secondary | ICD-10-CM | POA: Diagnosis present

## 2019-08-14 DIAGNOSIS — I10 Essential (primary) hypertension: Secondary | ICD-10-CM

## 2019-08-14 DIAGNOSIS — I480 Paroxysmal atrial fibrillation: Secondary | ICD-10-CM | POA: Diagnosis present

## 2019-08-14 DIAGNOSIS — Z9981 Dependence on supplemental oxygen: Secondary | ICD-10-CM | POA: Diagnosis not present

## 2019-08-14 DIAGNOSIS — E785 Hyperlipidemia, unspecified: Secondary | ICD-10-CM | POA: Diagnosis present

## 2019-08-14 DIAGNOSIS — Z7951 Long term (current) use of inhaled steroids: Secondary | ICD-10-CM | POA: Diagnosis not present

## 2019-08-14 DIAGNOSIS — Z20822 Contact with and (suspected) exposure to covid-19: Secondary | ICD-10-CM | POA: Diagnosis present

## 2019-08-14 DIAGNOSIS — F419 Anxiety disorder, unspecified: Secondary | ICD-10-CM | POA: Diagnosis present

## 2019-08-14 DIAGNOSIS — Z79890 Hormone replacement therapy (postmenopausal): Secondary | ICD-10-CM | POA: Diagnosis not present

## 2019-08-14 DIAGNOSIS — F329 Major depressive disorder, single episode, unspecified: Secondary | ICD-10-CM | POA: Diagnosis present

## 2019-08-14 DIAGNOSIS — D72829 Elevated white blood cell count, unspecified: Secondary | ICD-10-CM | POA: Diagnosis present

## 2019-08-14 DIAGNOSIS — Z7984 Long term (current) use of oral hypoglycemic drugs: Secondary | ICD-10-CM | POA: Diagnosis not present

## 2019-08-14 DIAGNOSIS — J439 Emphysema, unspecified: Secondary | ICD-10-CM | POA: Diagnosis present

## 2019-08-14 DIAGNOSIS — Z87891 Personal history of nicotine dependence: Secondary | ICD-10-CM | POA: Diagnosis not present

## 2019-08-14 DIAGNOSIS — R0603 Acute respiratory distress: Secondary | ICD-10-CM | POA: Diagnosis present

## 2019-08-14 DIAGNOSIS — Z6834 Body mass index (BMI) 34.0-34.9, adult: Secondary | ICD-10-CM | POA: Diagnosis not present

## 2019-08-14 DIAGNOSIS — E119 Type 2 diabetes mellitus without complications: Secondary | ICD-10-CM | POA: Diagnosis present

## 2019-08-14 DIAGNOSIS — E039 Hypothyroidism, unspecified: Secondary | ICD-10-CM | POA: Diagnosis present

## 2019-08-14 DIAGNOSIS — I5032 Chronic diastolic (congestive) heart failure: Secondary | ICD-10-CM | POA: Diagnosis present

## 2019-08-14 DIAGNOSIS — I110 Hypertensive heart disease with heart failure: Secondary | ICD-10-CM | POA: Diagnosis present

## 2019-08-14 LAB — BLOOD CULTURE ID PANEL (REFLEXED)
Acinetobacter baumannii: NOT DETECTED
Acinetobacter baumannii: NOT DETECTED
Candida albicans: NOT DETECTED
Candida albicans: NOT DETECTED
Candida glabrata: NOT DETECTED
Candida glabrata: NOT DETECTED
Candida krusei: NOT DETECTED
Candida krusei: NOT DETECTED
Candida parapsilosis: NOT DETECTED
Candida parapsilosis: NOT DETECTED
Candida tropicalis: NOT DETECTED
Candida tropicalis: NOT DETECTED
Enterobacter cloacae complex: NOT DETECTED
Enterobacter cloacae complex: NOT DETECTED
Enterobacteriaceae species: NOT DETECTED
Enterobacteriaceae species: NOT DETECTED
Enterococcus species: NOT DETECTED
Enterococcus species: NOT DETECTED
Escherichia coli: NOT DETECTED
Escherichia coli: NOT DETECTED
Haemophilus influenzae: NOT DETECTED
Haemophilus influenzae: NOT DETECTED
Klebsiella oxytoca: NOT DETECTED
Klebsiella oxytoca: NOT DETECTED
Klebsiella pneumoniae: NOT DETECTED
Klebsiella pneumoniae: NOT DETECTED
Listeria monocytogenes: NOT DETECTED
Listeria monocytogenes: NOT DETECTED
Methicillin resistance: DETECTED — AB
Neisseria meningitidis: NOT DETECTED
Neisseria meningitidis: NOT DETECTED
Proteus species: NOT DETECTED
Proteus species: NOT DETECTED
Pseudomonas aeruginosa: NOT DETECTED
Pseudomonas aeruginosa: NOT DETECTED
Serratia marcescens: NOT DETECTED
Serratia marcescens: NOT DETECTED
Staphylococcus aureus (BCID): NOT DETECTED
Staphylococcus aureus (BCID): NOT DETECTED
Staphylococcus species: DETECTED — AB
Staphylococcus species: NOT DETECTED
Streptococcus agalactiae: NOT DETECTED
Streptococcus agalactiae: NOT DETECTED
Streptococcus pneumoniae: NOT DETECTED
Streptococcus pneumoniae: NOT DETECTED
Streptococcus pyogenes: NOT DETECTED
Streptococcus pyogenes: NOT DETECTED
Streptococcus species: DETECTED — AB
Streptococcus species: NOT DETECTED

## 2019-08-14 LAB — CBC
HCT: 41 % (ref 36.0–46.0)
Hemoglobin: 13.1 g/dL (ref 12.0–15.0)
MCH: 30.8 pg (ref 26.0–34.0)
MCHC: 32 g/dL (ref 30.0–36.0)
MCV: 96.5 fL (ref 80.0–100.0)
Platelets: 293 10*3/uL (ref 150–400)
RBC: 4.25 MIL/uL (ref 3.87–5.11)
RDW: 15.2 % (ref 11.5–15.5)
WBC: 31.8 10*3/uL — ABNORMAL HIGH (ref 4.0–10.5)
nRBC: 0 % (ref 0.0–0.2)

## 2019-08-14 LAB — BASIC METABOLIC PANEL
Anion gap: 14 (ref 5–15)
BUN: 22 mg/dL (ref 8–23)
CO2: 26 mmol/L (ref 22–32)
Calcium: 9.5 mg/dL (ref 8.9–10.3)
Chloride: 102 mmol/L (ref 98–111)
Creatinine, Ser: 1.25 mg/dL — ABNORMAL HIGH (ref 0.44–1.00)
GFR calc Af Amer: 49 mL/min — ABNORMAL LOW (ref >60)
GFR calc non Af Amer: 42 mL/min — ABNORMAL LOW (ref >60)
Glucose, Bld: 179 mg/dL — ABNORMAL HIGH (ref 70–99)
Potassium: 4.7 mmol/L (ref 3.5–5.1)
Sodium: 142 mmol/L (ref 135–145)

## 2019-08-14 LAB — LACTIC ACID, PLASMA: Lactic Acid, Venous: 3.2 mmol/L (ref 0.5–1.9)

## 2019-08-14 LAB — GLUCOSE, CAPILLARY
Glucose-Capillary: 162 mg/dL — ABNORMAL HIGH (ref 70–99)
Glucose-Capillary: 135 mg/dL — ABNORMAL HIGH (ref 70–99)
Glucose-Capillary: 142 mg/dL — ABNORMAL HIGH (ref 70–99)
Glucose-Capillary: 137 mg/dL — ABNORMAL HIGH (ref 70–99)

## 2019-08-14 LAB — PROCALCITONIN: Procalcitonin: <0.1 ng/mL

## 2019-08-14 LAB — HEMOGLOBIN A1C
Hgb A1c MFr Bld: 6 % — ABNORMAL HIGH (ref 4.8–5.6)
Mean Plasma Glucose: 125.5 mg/dL

## 2019-08-14 LAB — SARS CORONAVIRUS 2 BY RT PCR (HOSPITAL ORDER, PERFORMED IN ~~LOC~~ HOSPITAL LAB): SARS Coronavirus 2: NEGATIVE

## 2019-08-14 MED ORDER — INSULIN ASPART 100 UNIT/ML ~~LOC~~ SOLN
0.0000 [IU] | Freq: Three times a day (TID) | SUBCUTANEOUS | Status: DC
  Administered 2019-08-14 (×2): 1 [IU] via SUBCUTANEOUS
  Administered 2019-08-14 – 2019-08-15 (×2): 2 [IU] via SUBCUTANEOUS
  Administered 2019-08-15 – 2019-08-17 (×7): 1 [IU] via SUBCUTANEOUS
  Filled 2019-08-14 (×10): qty 1

## 2019-08-14 MED ORDER — DILTIAZEM HCL ER COATED BEADS 180 MG PO CP24
300.0000 mg | ORAL_CAPSULE | Freq: Every day | ORAL | Status: DC
  Administered 2019-08-14 – 2019-08-17 (×4): 300 mg via ORAL
  Filled 2019-08-14 (×4): qty 1

## 2019-08-14 MED ORDER — BUSPIRONE HCL 15 MG PO TABS
15.0000 mg | ORAL_TABLET | Freq: Two times a day (BID) | ORAL | Status: DC
  Administered 2019-08-14 – 2019-08-17 (×7): 15 mg via ORAL
  Filled 2019-08-14 (×9): qty 1
  Filled 2019-08-14: qty 3
  Filled 2019-08-14: qty 1

## 2019-08-14 MED ORDER — LEVALBUTEROL HCL 0.63 MG/3ML IN NEBU
0.6300 mg | INHALATION_SOLUTION | RESPIRATORY_TRACT | Status: DC | PRN
  Filled 2019-08-14 (×2): qty 3

## 2019-08-14 MED ORDER — IPRATROPIUM BROMIDE 0.02 % IN SOLN
0.5000 mg | Freq: Four times a day (QID) | RESPIRATORY_TRACT | Status: DC
  Administered 2019-08-14 – 2019-08-17 (×13): 0.5 mg via RESPIRATORY_TRACT
  Filled 2019-08-14 (×14): qty 2.5

## 2019-08-14 MED ORDER — APIXABAN 5 MG PO TABS
5.0000 mg | ORAL_TABLET | Freq: Two times a day (BID) | ORAL | Status: DC
  Administered 2019-08-14 – 2019-08-17 (×7): 5 mg via ORAL
  Filled 2019-08-14 (×7): qty 1

## 2019-08-14 MED ORDER — LEVALBUTEROL HCL 0.63 MG/3ML IN NEBU
0.6300 mg | INHALATION_SOLUTION | Freq: Four times a day (QID) | RESPIRATORY_TRACT | Status: DC
  Administered 2019-08-14 – 2019-08-17 (×13): 0.63 mg via RESPIRATORY_TRACT
  Filled 2019-08-14 (×14): qty 3

## 2019-08-14 MED ORDER — CITALOPRAM HYDROBROMIDE 20 MG PO TABS
40.0000 mg | ORAL_TABLET | Freq: Every day | ORAL | Status: DC
  Administered 2019-08-14 – 2019-08-16 (×3): 40 mg via ORAL
  Filled 2019-08-14 (×3): qty 2

## 2019-08-14 MED ORDER — TIOTROPIUM BROMIDE MONOHYDRATE 18 MCG IN CAPS
1.0000 | ORAL_CAPSULE | Freq: Every day | RESPIRATORY_TRACT | Status: DC
  Administered 2019-08-14 – 2019-08-17 (×4): 18 ug via RESPIRATORY_TRACT
  Filled 2019-08-14 (×2): qty 5

## 2019-08-14 MED ORDER — AZITHROMYCIN 250 MG PO TABS
250.0000 mg | ORAL_TABLET | Freq: Every day | ORAL | Status: AC
  Administered 2019-08-14 – 2019-08-17 (×4): 250 mg via ORAL
  Filled 2019-08-14 (×4): qty 1

## 2019-08-14 MED ORDER — ACETAMINOPHEN 650 MG RE SUPP: 650.0000 mg | Freq: Four times a day (QID) | RECTAL | Status: DC | PRN

## 2019-08-14 MED ORDER — ONDANSETRON HCL 4 MG/2ML IJ SOLN: 4.0000 mg | Freq: Four times a day (QID) | INTRAMUSCULAR | Status: DC | PRN

## 2019-08-14 MED ORDER — SODIUM CHLORIDE 0.9 % IV SOLN
2.0000 g | INTRAVENOUS | Status: DC
  Administered 2019-08-14 – 2019-08-16 (×3): 2 g via INTRAVENOUS
  Filled 2019-08-14: qty 2
  Filled 2019-08-14 (×2): qty 20
  Filled 2019-08-14: qty 2
  Filled 2019-08-14: qty 20

## 2019-08-14 MED ORDER — ACETAMINOPHEN 325 MG PO TABS
650.0000 mg | ORAL_TABLET | Freq: Four times a day (QID) | ORAL | Status: DC | PRN
  Administered 2019-08-14: 650 mg via ORAL
  Filled 2019-08-14: qty 2

## 2019-08-14 MED ORDER — SODIUM CHLORIDE 0.9 % IV SOLN: 1.0000 g | INTRAVENOUS | Status: DC

## 2019-08-14 MED ORDER — METHYLPREDNISOLONE SODIUM SUCC 40 MG IJ SOLR
40.0000 mg | Freq: Two times a day (BID) | INTRAMUSCULAR | Status: DC
  Administered 2019-08-14 – 2019-08-16 (×5): 40 mg via INTRAVENOUS
  Filled 2019-08-14 (×5): qty 1

## 2019-08-14 MED ORDER — LEVOTHYROXINE SODIUM 150 MCG PO TABS
150.0000 ug | ORAL_TABLET | Freq: Every day | ORAL | Status: DC
  Administered 2019-08-14 – 2019-08-17 (×4): 150 ug via ORAL
  Filled 2019-08-14: qty 3
  Filled 2019-08-14 (×2): qty 1
  Filled 2019-08-14 (×2): qty 3
  Filled 2019-08-14 (×2): qty 1

## 2019-08-14 MED ORDER — FUROSEMIDE 40 MG PO TABS
40.0000 mg | ORAL_TABLET | ORAL | Status: DC
  Administered 2019-08-14 – 2019-08-17 (×4): 40 mg via ORAL
  Filled 2019-08-14 (×5): qty 1

## 2019-08-14 MED ORDER — BISOPROLOL FUMARATE 5 MG PO TABS
2.5000 mg | ORAL_TABLET | Freq: Two times a day (BID) | ORAL | Status: DC
  Administered 2019-08-14 – 2019-08-17 (×7): 2.5 mg via ORAL
  Filled 2019-08-14 (×10): qty 0.5

## 2019-08-14 MED ORDER — ONDANSETRON HCL 4 MG PO TABS: 4.0000 mg | ORAL_TABLET | Freq: Four times a day (QID) | ORAL | Status: DC | PRN

## 2019-08-14 MED ORDER — SODIUM CHLORIDE 0.9% FLUSH
3.0000 mL | Freq: Two times a day (BID) | INTRAVENOUS | Status: DC
  Administered 2019-08-14 – 2019-08-17 (×8): 3 mL via INTRAVENOUS

## 2019-08-14 MED ORDER — PRAVASTATIN SODIUM 20 MG PO TABS
10.0000 mg | ORAL_TABLET | Freq: Every day | ORAL | Status: DC
  Administered 2019-08-15 – 2019-08-16 (×2): 10 mg via ORAL
  Filled 2019-08-14 (×3): qty 1

## 2019-08-14 MED ORDER — GUAIFENESIN ER 600 MG PO TB12
600.0000 mg | ORAL_TABLET | Freq: Two times a day (BID) | ORAL | Status: DC
  Administered 2019-08-14 – 2019-08-17 (×7): 600 mg via ORAL
  Filled 2019-08-14 (×7): qty 1

## 2019-08-14 MED ORDER — LORAZEPAM 0.5 MG PO TABS
0.5000 mg | ORAL_TABLET | Freq: Two times a day (BID) | ORAL | Status: DC | PRN
  Administered 2019-08-14 – 2019-08-17 (×7): 0.5 mg via ORAL
  Filled 2019-08-14 (×7): qty 1

## 2019-08-14 NOTE — ED Notes (Signed)
Pt cleaned of urine and new pads/sheet placed. Pt pulled up in bed and made comfortable. Daughter remains with pt.  Waiting on admit bed.

## 2019-08-14 NOTE — ED Notes (Signed)
Report from sherrie, rn.  

## 2019-08-14 NOTE — Progress Notes (Signed)
PHARMACY - PHYSICIAN COMMUNICATION CRITICAL VALUE ALERT - BLOOD CULTURE IDENTIFICATION (BCID)  Summer Bradley is an 74 y.o. female who presented to [facility] on 08/13/2019 with a chief complaint of [facility]  Assessment:  AECOPD (include suspected source if known). Patient with 2 bottles from the same set (Left)- 1 growing Staph Species MecA+ in anaerobic bottle and Strep Species in aerobic bottle.   Both bottles from right are currently no growth.   Name of physician (or Provider) Contacted: B.Ayiku  Current antibiotics: Azithromycin  Changes to prescribed antibiotics recommended:  Recommendations accepted by provider Discussed with ID Pharmacist also. REcommended Ceftriaxone if wanted to cover the Strep until speciated. Staph species likely a contaminant. Both from same set of Bcx. Other set has No growth.  Results for orders placed or performed during the hospital encounter of 08/13/19  Blood Culture ID Panel (Reflexed) (Collected: 08/13/2019 10:00 PM)  Result Value Ref Range   Enterococcus species NOT DETECTED NOT DETECTED   Listeria monocytogenes NOT DETECTED NOT DETECTED   Staphylococcus species NOT DETECTED NOT DETECTED   Staphylococcus aureus (BCID) NOT DETECTED NOT DETECTED   Streptococcus species DETECTED (A) NOT DETECTED   Streptococcus agalactiae NOT DETECTED NOT DETECTED   Streptococcus pneumoniae NOT DETECTED NOT DETECTED   Streptococcus pyogenes NOT DETECTED NOT DETECTED   Acinetobacter baumannii NOT DETECTED NOT DETECTED   Enterobacteriaceae species NOT DETECTED NOT DETECTED   Enterobacter cloacae complex NOT DETECTED NOT DETECTED   Escherichia coli NOT DETECTED NOT DETECTED   Klebsiella oxytoca NOT DETECTED NOT DETECTED   Klebsiella pneumoniae NOT DETECTED NOT DETECTED   Proteus species NOT DETECTED NOT DETECTED   Serratia marcescens NOT DETECTED NOT DETECTED   Haemophilus influenzae NOT DETECTED NOT DETECTED   Neisseria meningitidis NOT DETECTED NOT DETECTED    Pseudomonas aeruginosa NOT DETECTED NOT DETECTED   Candida albicans NOT DETECTED NOT DETECTED   Candida glabrata NOT DETECTED NOT DETECTED   Candida krusei NOT DETECTED NOT DETECTED   Candida parapsilosis NOT DETECTED NOT DETECTED   Candida tropicalis NOT DETECTED NOT DETECTED    [name] A 08/14/2019  6:47 PM

## 2019-08-14 NOTE — ED Notes (Signed)
This RN at bedside. Pt stating she is having increased difficulty breathing. Pt bed raised. PT oxygen sats 97-99% on 4L [place]. Pt given PRN neb tx. Admitting MD made aware.

## 2019-08-14 NOTE — ED Notes (Signed)
Report to jennifer, rn.  

## 2019-08-14 NOTE — ED Notes (Signed)
Daughter at bedside. Pt tolerating nasal cannula and sleeping soundly.

## 2019-08-14 NOTE — ED Notes (Signed)
Pt is dry, no urine output since this rn assumed care at 0300. Daughter updated on plan of care. Pt resting, tolerating nasal cannula well.

## 2019-08-14 NOTE — ED Notes (Signed)
Admitting MD at the bedside. Orders given to decrease bipap settings to 45% of O2.

## 2019-08-14 NOTE — ED Notes (Signed)
Pt taken off bipap and placed on 4L O2 via nasal cannula.

## 2019-08-14 NOTE — ED Notes (Signed)
Breathing tx complete. Pt readjusted in bed.

## 2019-08-14 NOTE — Plan of Care (Signed)
  Problem: Education: Goal: Knowledge of General Education information will improve Description Including pain rating scale, medication(s)/side effects and non-pharmacologic comfort measures Outcome: Progressing   

## 2019-08-14 NOTE — Progress Notes (Addendum)
Progress Note    Summer Bradley  NTI:[id number] DOB: [birth year]  DOA: 08/13/2019 PCP: Lynn      Brief Narrative:    Medical records reviewed and are as summarized below:  Summer Bradley is a 74 y.o. female with medical history significant for COPD on 3 L supplemental O2 via , atrial fibrillation on Eliquis, history of CVA, type 2 diabetes, hypertension, hyperlipidemia, hypothyroidism, and depression/anxiety who presented to the ED for evaluation of dyspnea and productive cough.  Reportedly, her oxygen saturation was 82% on 3 L of oxygen via nasal cannula.  She was placed on  oxygen via nonrebreather mask and subsequently CPAP and transported to the emergency room.  She was admitted for COPD exacerbation and acute on chronic hypoxemic respiratory failure.  She was initially treated with BiPAP and transitioned to oxygen via nasal cannula. She was also treated with empiric IV antibiotics and steroids.   Assessment/Plan:   Principal Problem:   Acute on chronic respiratory failure with hypoxia (HCC) Active Problems:   Paroxysmal A-fib (HCC)   HLD (hyperlipidemia)   Essential hypertension   COPD with acute exacerbation (HCC)   Anxiety and depression   Hypothyroidism   History of CVA (cerebrovascular accident)   COPD exacerbation (HCC)   Acute on chronic respiratory failure with hypoxia due to acute COPD exacerbation: BiPAP as needed for increased work of breathing.  Continue empiric IV antibiotics, IV steroids and bronchodilators.  Leukocytosis: Leukocytosis is worsening.  No evidence of infection thus far.  Continue empiric IV antibiotics.  Monitor CBC and follow-up blood cultures.  Paroxysmal atrial fibrillation with RVR: Continue diltiazem, bisoprolol and Eliquis.  Hypertension: Continue antihypertensives  Type 2 diabetes: Hold home Metformin.  NovoLog as needed for hyperglycemia.  Hypothyroidism: Continue  Synthroid.  History of CVA: Continue Eliquis and statin.  Hyperlipidemia: Continue statin.  Depression/anxiety: Continue Celexa, BuSpar, Ativan as needed with hold parameters.   Body mass index is 32.61 kg/m.  Diet Order            Diet heart healthy/carb modified Room service appropriate? Yes; Fluid consistency: Thin  Diet effective now                       Medications:   . apixaban  5 mg Oral BID  . azithromycin  250 mg Oral Daily  . bisoprolol  2.5 mg Oral BID  . busPIRone  15 mg Oral BID  . citalopram  40 mg Oral QHS  . diltiazem  300 mg Oral Daily  . furosemide  40 mg Oral BH-q7a  . guaiFENesin  600 mg Oral BID  . insulin aspart  0-9 Units Subcutaneous TID WC  . ipratropium  0.5 mg Nebulization Q6H  . levalbuterol  0.63 mg Nebulization Q6H  . levothyroxine  150 mcg Oral QAC breakfast  . methylPREDNISolone (SOLU-MEDROL) injection  40 mg Intravenous Q12H  . pravastatin  10 mg Oral q1800  . sodium chloride flush  3 mL Intravenous Q12H  . tiotropium  1 capsule Inhalation Daily   Continuous Infusions: . cefTRIAXone (ROCEPHIN)  IV       Anti-infectives (From admission, onward)   Start     Dose/Rate Route Frequency Ordered Stop   08/14/19 1600  cefTRIAXone (ROCEPHIN) 1 g in sodium chloride 0.9 % 100 mL IVPB     Discontinue     1 g 200 mL/hr over 30 Minutes Intravenous Every 24 hours 08/14/19 1551  08/14/19 0007  azithromycin (ZITHROMAX) tablet 250 mg     Discontinue     250 mg Oral Daily 08/14/19 0007 08/18/19 0959   08/13/19 2315  cefTRIAXone (ROCEPHIN) 1 g in sodium chloride 0.9 % 100 mL IVPB        1 g 200 mL/hr over 30 Minutes Intravenous  Once 08/13/19 2304 08/13/19 2358   08/13/19 2315  azithromycin (ZITHROMAX) 500 mg in sodium chloride 0.9 % 250 mL IVPB        500 mg 250 mL/hr over 60 Minutes Intravenous  Once 08/13/19 2304 08/14/19 0058             Family Communication/Anticipated D/C date and plan/Code Status   DVT  prophylaxis:  apixaban (ELIQUIS) tablet 5 mg     Code Status: DNR  Family Communication: Plan discussed with her daughter at the bedside Disposition Plan:    Status is: Inpatient  Remains inpatient appropriate because:IV treatments appropriate due to intensity of illness or inability to take PO and Inpatient level of care appropriate due to severity of illness   Dispo: The patient is from: Home              Anticipated d/c is to: Home              Anticipated d/c date is: 1 day              Patient currently is not medically stable to d/c.                Subjective:   C/o shortness of breath. No chest pain   Objective:    Vitals:   08/14/19 0954 08/14/19 1015 08/14/19 1200 08/14/19 1500  BP:  106/75 (!) 122/93 108/73  Pulse: 99 (!) 117 (!) 118 75  Resp:  (!) 25 (!) 23 (!) 23  Temp:      TempSrc:      SpO2: 97% 96% 99% 97%  Weight:      Height:       No data found.  No intake or output data in the 24 hours ending 08/14/19 1551 Filed Weights   08/13/19 2152  Weight: 86.2 kg    Exam:  GEN: NAD SKIN: Multiple bruises on b/l upper extremities EYES: EOMI ENT: MMM CV: Irregular rate and tachycardic PULM: CTA B ABD: soft, ND, NT, +BS CNS: AAO x 3, non focal EXT: No edema or tenderness   Data Reviewed:   I have personally reviewed following labs and imaging studies:  Labs: Labs show the following:   Basic Metabolic Panel: Recent Labs  Lab 08/13/19 2208 08/14/19 0435  NA 141 142  K 5.0 4.7  CL 101 102  CO2 28 26  GLUCOSE 122* 179*  BUN 19 22  CREATININE 1.12* 1.25*  CALCIUM 9.4 9.5   GFR Estimated Creatinine Clearance: 42 mL/min (A) (by C-G formula based on SCr of 1.25 mg/dL (H)). Liver Function Tests: No results for input(s): AST, ALT, ALKPHOS, BILITOT, PROT, ALBUMIN in the last 168 hours. No results for input(s): LIPASE, AMYLASE in the last 168 hours. No results for input(s): AMMONIA in the last 168 hours. Coagulation  profile No results for input(s): INR, PROTIME in the last 168 hours.  CBC: Recent Labs  Lab 08/13/19 2208 08/14/19 0435  WBC 28.5* 31.8*  NEUTROABS 23.8*  --   HGB 13.8 13.1  HCT 42.6 41.0  MCV 96.2 96.5  PLT 313 293   Cardiac Enzymes: No results for input(s): CKTOTAL,  CKMB, CKMBINDEX, TROPONINI in the last 168 hours. BNP (last 3 results) No results for input(s): PROBNP in the last 8760 hours. CBG: Recent Labs  Lab 08/14/19 0900 08/14/19 1238  GLUCAP 162* 135*   D-Dimer: No results for input(s): DDIMER in the last 72 hours. Hgb A1c: Recent Labs    08/14/19 0435  HGBA1C 6.0*   Lipid Profile: No results for input(s): CHOL, HDL, LDLCALC, TRIG, CHOLHDL, LDLDIRECT in the last 72 hours. Thyroid function studies: No results for input(s): TSH, T4TOTAL, T3FREE, THYROIDAB in the last 72 hours.  Invalid input(s): FREET3 Anemia work up: No results for input(s): VITAMINB12, FOLATE, FERRITIN, TIBC, IRON, RETICCTPCT in the last 72 hours. Sepsis Labs: Recent Labs  Lab 08/13/19 2208 08/14/19 0111 08/14/19 0435  PROCALCITON <0.10  --   --   WBC 28.5*  --  31.8*  LATICACIDVEN 2.6* 3.2*  --     Microbiology Recent Results (from the past 240 hour(s))  Blood Culture ID Panel (Reflexed)     Status: Abnormal   Collection Time: 08/13/19 10:00 PM  Result Value Ref Range Status   Enterococcus species NOT DETECTED NOT DETECTED Final   Listeria monocytogenes NOT DETECTED NOT DETECTED Final   Staphylococcus species NOT DETECTED NOT DETECTED Final   Staphylococcus aureus (BCID) NOT DETECTED NOT DETECTED Final   Streptococcus species DETECTED (A) NOT DETECTED Final    Comment: Not Enterococcus species, Streptococcus agalactiae, Streptococcus pyogenes, or Streptococcus pneumoniae. CRITICAL RESULT CALLED TO, READ BACK BY AND VERIFIED WITH: JASON ROBBINS AT 1501 08/14/19 SDR    Streptococcus agalactiae NOT DETECTED NOT DETECTED Final   Streptococcus pneumoniae NOT DETECTED NOT  DETECTED Final   Streptococcus pyogenes NOT DETECTED NOT DETECTED Final   Acinetobacter baumannii NOT DETECTED NOT DETECTED Final   Enterobacteriaceae species NOT DETECTED NOT DETECTED Final   Enterobacter cloacae complex NOT DETECTED NOT DETECTED Final   Escherichia coli NOT DETECTED NOT DETECTED Final   Klebsiella oxytoca NOT DETECTED NOT DETECTED Final   Klebsiella pneumoniae NOT DETECTED NOT DETECTED Final   Proteus species NOT DETECTED NOT DETECTED Final   Serratia marcescens NOT DETECTED NOT DETECTED Final   Haemophilus influenzae NOT DETECTED NOT DETECTED Final   Neisseria meningitidis NOT DETECTED NOT DETECTED Final   Pseudomonas aeruginosa NOT DETECTED NOT DETECTED Final   Candida albicans NOT DETECTED NOT DETECTED Final   Candida glabrata NOT DETECTED NOT DETECTED Final   Candida krusei NOT DETECTED NOT DETECTED Final   Candida parapsilosis NOT DETECTED NOT DETECTED Final   Candida tropicalis NOT DETECTED NOT DETECTED Final    Comment: Performed at [facility], [address] [place], [place] [address]  Culture, blood (routine x 2)     Status: None (Preliminary result)   Collection Time: 08/13/19 10:08 PM   Specimen: BLOOD  Result Value Ref Range Status   Specimen Description BLOOD LEFT ANTECUBITAL  Final   Special Requests   Final    BOTTLES DRAWN AEROBIC AND ANAEROBIC Blood Culture adequate volume   Culture  Setup Time   Final    Organism ID to follow GRAM POSITIVE COCCI ANAEROBIC BOTTLE ONLY GRAM POSITIVE COCCI IN CHAINS AEROBIC BOTTLE ONLY CRITICAL RESULT CALLED TO, READ BACK BY AND VERIFIED WITHCorene Cornea ROBBINS AT 6237 08/14/19 [place] Performed at [facility], [address], [place],  [address]    Culture   Final    Lonell Grandchild POSITIVE COCCI GRAM POSITIVE COCCI IN CHAINS    Report Status PENDING  Incomplete  SARS Coronavirus  2 by RT PCR (hospital order, performed in [facility] hospital lab) Nasopharyngeal Nasopharyngeal Swab     Status: None    Collection Time: 08/13/19 10:08 PM   Specimen: Nasopharyngeal Swab  Result Value Ref Range Status   SARS Coronavirus 2 NEGATIVE NEGATIVE Final    Comment: (NOTE) SARS-CoV-2 target nucleic acids are NOT DETECTED.  The SARS-CoV-2 RNA is generally detectable in upper and lower respiratory specimens during the acute phase of infection. The lowest concentration of SARS-CoV-2 viral copies this assay can detect is 250 copies / mL. A negative result does not preclude SARS-CoV-2 infection and should not be used as the sole basis for treatment or other patient management decisions.  A negative result may occur with improper specimen collection / handling, submission of specimen other than nasopharyngeal swab, presence of viral mutation(s) within the areas targeted by this assay, and inadequate number of viral copies (<250 copies / mL). A negative result must be combined with clinical observations, patient history, and epidemiological information.  Fact Sheet for Patients:   StrictlyIdeas.no  Fact Sheet for Healthcare Providers: BankingDealers.co.za  This test is not yet approved or  cleared by the [place] FDA and has been authorized for detection and/or diagnosis of SARS-CoV-2 by FDA under an Emergency Use Authorization (EUA).  This EUA will remain in effect (meaning this test can be used) for the duration of the COVID-19 declaration under Section 564(b)(1) of the Act, 21 U.S.C. section 360bbb-3(b)(1), unless the authorization is terminated or revoked sooner.  Performed at [facility], [address] [place], [place] [address]   Blood Culture ID Panel (Reflexed)     Status: Abnormal   Collection Time: 08/13/19 10:08 PM  Result Value Ref Range Status   Enterococcus species NOT DETECTED NOT DETECTED Final   Listeria monocytogenes NOT DETECTED NOT DETECTED Final   Staphylococcus species DETECTED (A) NOT DETECTED Final     Comment: Methicillin (oxacillin) resistant coagulase negative staphylococcus. Possible blood culture contaminant (unless isolated from more than one blood culture draw or clinical case suggests pathogenicity). No antibiotic treatment is indicated for blood  culture contaminants. CRITICAL RESULT CALLED TO, READ BACK BY AND VERIFIED WITH:  JASON ROBBINS AT 1501 08/14/19 SDR    Staphylococcus aureus (BCID) NOT DETECTED NOT DETECTED Final   Methicillin resistance DETECTED (A) NOT DETECTED Final    Comment: CRITICAL RESULT CALLED TO, READ BACK BY AND VERIFIED WITH:  JASON ROBBINS AT 1501 08/14/19 SDR    Streptococcus species NOT DETECTED NOT DETECTED Final   Streptococcus agalactiae NOT DETECTED NOT DETECTED Final   Streptococcus pneumoniae NOT DETECTED NOT DETECTED Final   Streptococcus pyogenes NOT DETECTED NOT DETECTED Final   Acinetobacter baumannii NOT DETECTED NOT DETECTED Final   Enterobacteriaceae species NOT DETECTED NOT DETECTED Final   Enterobacter cloacae complex NOT DETECTED NOT DETECTED Final   Escherichia coli NOT DETECTED NOT DETECTED Final   Klebsiella oxytoca NOT DETECTED NOT DETECTED Final   Klebsiella pneumoniae NOT DETECTED NOT DETECTED Final   Proteus species NOT DETECTED NOT DETECTED Final   Serratia marcescens NOT DETECTED NOT DETECTED Final   Haemophilus influenzae NOT DETECTED NOT DETECTED Final   Neisseria meningitidis NOT DETECTED NOT DETECTED Final   Pseudomonas aeruginosa NOT DETECTED NOT DETECTED Final   Candida albicans NOT DETECTED NOT DETECTED Final   Candida glabrata NOT DETECTED NOT DETECTED Final   Candida krusei NOT DETECTED NOT DETECTED Final   Candida parapsilosis NOT DETECTED NOT DETECTED Final   Candida tropicalis NOT  DETECTED NOT DETECTED Final    Comment: Performed at [facility], [address] [place], [place] [address]  Culture, blood (routine x 2)     Status: None (Preliminary result)   Collection Time: 08/13/19 11:17 PM    Specimen: BLOOD  Result Value Ref Range Status   Specimen Description BLOOD RIGHT FA  Final   Special Requests   Final    BOTTLES DRAWN AEROBIC AND ANAEROBIC Blood Culture adequate volume   Culture   Final    NO GROWTH < 12 HOURS Performed at [facility], [address] [place], [place] [address]    Report Status PENDING  Incomplete    Procedures and diagnostic studies:  DG Chest Portable 1 View  Result Date: 08/13/2019 CLINICAL DATA:  Short of breath, hypoxia EXAM: PORTABLE CHEST 1 VIEW COMPARISON:  04/28/2019 FINDINGS: Single frontal view of the chest demonstrates stable enlargement the cardiac silhouette. Diffuse emphysema with marked background interstitial scarring unchanged. No focal airspace disease, effusion, or pneumothorax. No acute bony abnormalities. IMPRESSION: 1. Stable emphysema, no superimposed process. Electronically Signed   By: Randa Ngo M.D.   On: 08/13/2019 22:39               LOS: 0 days   Krishana Lutze  [facility]     08/14/2019, 3:51 PM

## 2019-08-15 LAB — BASIC METABOLIC PANEL
Anion gap: 11 (ref 5–15)
BUN: 24 mg/dL — ABNORMAL HIGH (ref 8–23)
CO2: 30 mmol/L (ref 22–32)
Calcium: 9.4 mg/dL (ref 8.9–10.3)
Chloride: 103 mmol/L (ref 98–111)
Creatinine, Ser: 0.91 mg/dL (ref 0.44–1.00)
GFR calc Af Amer: >60 mL/min (ref >60)
GFR calc non Af Amer: >60 mL/min (ref >60)
Glucose, Bld: 152 mg/dL — ABNORMAL HIGH (ref 70–99)
Potassium: 4 mmol/L (ref 3.5–5.1)
Sodium: 144 mmol/L (ref 135–145)

## 2019-08-15 LAB — CBC WITH DIFFERENTIAL/PLATELET
Abs Immature Granulocytes: 0.33 10*3/uL — ABNORMAL HIGH (ref 0.00–0.07)
Basophils Absolute: 0.1 10*3/uL (ref 0.0–0.1)
Basophils Relative: 0 %
Eosinophils Absolute: 0 10*3/uL (ref 0.0–0.5)
Eosinophils Relative: 0 %
HCT: 41.1 % (ref 36.0–46.0)
Hemoglobin: 13.6 g/dL (ref 12.0–15.0)
Immature Granulocytes: 1 %
Lymphocytes Relative: 5 %
Lymphs Abs: 1.4 10*3/uL (ref 0.7–4.0)
MCH: 31.4 pg (ref 26.0–34.0)
MCHC: 33.1 g/dL (ref 30.0–36.0)
MCV: 94.9 fL (ref 80.0–100.0)
Monocytes Absolute: 0.7 10*3/uL (ref 0.1–1.0)
Monocytes Relative: 3 %
Neutro Abs: 23.4 10*3/uL — ABNORMAL HIGH (ref 1.7–7.7)
Neutrophils Relative %: 91 %
Platelets: 322 10*3/uL (ref 150–400)
RBC: 4.33 MIL/uL (ref 3.87–5.11)
RDW: 15.5 % (ref 11.5–15.5)
Smear Review: NORMAL
WBC: 25.8 10*3/uL — ABNORMAL HIGH (ref 4.0–10.5)
nRBC: 0 % (ref 0.0–0.2)

## 2019-08-15 LAB — GLUCOSE, CAPILLARY
Glucose-Capillary: 161 mg/dL — ABNORMAL HIGH (ref 70–99)
Glucose-Capillary: 127 mg/dL — ABNORMAL HIGH (ref 70–99)
Glucose-Capillary: 139 mg/dL — ABNORMAL HIGH (ref 70–99)
Glucose-Capillary: 144 mg/dL — ABNORMAL HIGH (ref 70–99)

## 2019-08-15 MED ORDER — SODIUM CHLORIDE 0.9 % IV SOLN
INTRAVENOUS | Status: DC | PRN
  Administered 2019-08-15: 250 mL via INTRAVENOUS

## 2019-08-15 MED ORDER — FAMOTIDINE 20 MG PO TABS
20.0000 mg | ORAL_TABLET | Freq: Every day | ORAL | Status: DC
  Administered 2019-08-15 – 2019-08-17 (×3): 20 mg via ORAL
  Filled 2019-08-15 (×3): qty 1

## 2019-08-15 NOTE — Progress Notes (Signed)
Progress Note    Summer Bradley  QMV:[id number] DOB: 28-Mar-1945  DOA: 08/13/2019 PCP: [facility]      Brief Narrative:    Medical records reviewed and are as summarized below:  Summer Bradley is a 74 y.o. female with medical history significant for COPD on 3 L supplemental O2 via [place], atrial fibrillation on Eliquis, history of CVA, type 2 diabetes, hypertension, hyperlipidemia, hypothyroidism, and depression/anxiety who presented to the ED for evaluation of dyspnea and productive cough.  Reportedly, her oxygen saturation was 82% on 3 L of oxygen via nasal cannula.  She was placed on  oxygen via nonrebreather mask and subsequently CPAP and transported to the emergency room.  She was admitted for COPD exacerbation and acute on chronic hypoxemic respiratory failure.  She was initially treated with BiPAP and transitioned to oxygen via nasal cannula. She was also treated with empiric IV antibiotics and steroids.   Assessment/Plan:   Principal Problem:   Acute on chronic respiratory failure with hypoxia (HCC) Active Problems:   Paroxysmal A-fib (HCC)   HLD (hyperlipidemia)   Essential hypertension   COPD with acute exacerbation (HCC)   Anxiety and depression   Hypothyroidism   History of CVA (cerebrovascular accident)   COPD exacerbation (HCC)   Acute on chronic respiratory failure with hypoxia due to acute COPD exacerbation: BiPAP as needed for increased work of breathing.  Continue empiric IV antibiotics, IV steroids and bronchodilators.  Leukocytosis, bacteremia: Leukocytosis is trending down.  1 blood culture bottle showed MRSA bacteremia and another blood culture also showed strep species bacteremia.  These are likely contaminants.  Continue empiric IV antibiotics.  Monitor CBC repeat blood cultures.  Paroxysmal atrial fibrillation with RVR: Heart rate is better.  Continue diltiazem, bisoprolol and Eliquis.  Hypertension: Continue  antihypertensives  Type 2 diabetes: Hold home Metformin.  NovoLog as needed for hyperglycemia.  Hypothyroidism: Continue Synthroid.  History of CVA: Continue Eliquis and statin.  Hyperlipidemia: Continue statin.  Depression/anxiety: Continue Celexa, BuSpar, Ativan as needed with hold parameters.   Body mass index is 34.88 kg/m.  Diet Order            Diet heart healthy/carb modified Room service appropriate? Yes; Fluid consistency: Thin  Diet effective now                       Medications:   . apixaban  5 mg Oral BID  . azithromycin  250 mg Oral Daily  . bisoprolol  2.5 mg Oral BID  . busPIRone  15 mg Oral BID  . citalopram  40 mg Oral QHS  . diltiazem  300 mg Oral Daily  . famotidine  20 mg Oral Daily  . furosemide  40 mg Oral BH-q7a  . guaiFENesin  600 mg Oral BID  . insulin aspart  0-9 Units Subcutaneous TID WC  . ipratropium  0.5 mg Nebulization Q6H  . levalbuterol  0.63 mg Nebulization Q6H  . levothyroxine  150 mcg Oral QAC breakfast  . methylPREDNISolone (SOLU-MEDROL) injection  40 mg Intravenous Q12H  . pravastatin  10 mg Oral q1800  . sodium chloride flush  3 mL Intravenous Q12H  . tiotropium  1 capsule Inhalation Daily   Continuous Infusions: . sodium chloride    . cefTRIAXone (ROCEPHIN)  IV Stopped (08/14/19 1737)     Anti-infectives (From admission, onward)   Start     Dose/Rate Route Frequency Ordered Stop   08/14/19 1800  cefTRIAXone (ROCEPHIN) 2 g in sodium chloride 0.9 % 100 mL IVPB     Discontinue     2 g 200 mL/hr over 30 Minutes Intravenous Every 24 hours 08/14/19 1606     08/14/19 1600  cefTRIAXone (ROCEPHIN) 1 g in sodium chloride 0.9 % 100 mL IVPB  Status:  Discontinued        1 g 200 mL/hr over 30 Minutes Intravenous Every 24 hours 08/14/19 1551 08/14/19 1606   08/14/19 0007  azithromycin (ZITHROMAX) tablet 250 mg     Discontinue     250 mg Oral Daily 08/14/19 0007 08/18/19 0959   08/13/19 2315  cefTRIAXone  (ROCEPHIN) 1 g in sodium chloride 0.9 % 100 mL IVPB        1 g 200 mL/hr over 30 Minutes Intravenous  Once 08/13/19 2304 08/13/19 2358   08/13/19 2315  azithromycin (ZITHROMAX) 500 mg in sodium chloride 0.9 % 250 mL IVPB        500 mg 250 mL/hr over 60 Minutes Intravenous  Once 08/13/19 2304 08/14/19 0058             Family Communication/Anticipated D/C date and plan/Code Status   DVT prophylaxis:  apixaban (ELIQUIS) tablet 5 mg     Code Status: DNR  Family Communication: None Disposition Plan:    Status is: Inpatient  Remains inpatient appropriate because:IV treatments appropriate due to intensity of illness or inability to take PO and Inpatient level of care appropriate due to severity of illness   Dispo: The patient is from: Home              Anticipated d/c is to: Home              Anticipated d/c date is: 1 day              Patient currently is not medically stable to d/c.                Subjective:   C/o shortness of breath and anxiety.  She said she feels "agitated" because she cannot breathe.  Objective:    Vitals:   08/15/19 0758 08/15/19 0808 08/15/19 0857 08/15/19 1128  BP: 120/69   107/70  Pulse: 81   73  Resp: 20   18  Temp:  98.2 F (36.8 C)  98.3 F (36.8 C)  TempSrc:  Oral  Oral  SpO2: 98%  98% 99%  Weight:      Height:       No data found.   Intake/Output Summary (Last 24 hours) at 08/15/2019 1550 Last data filed at 08/15/2019 1345 Gross per 24 hour  Intake 973 ml  Output 1250 ml  Net -277 ml   Filed Weights   08/13/19 2152 08/14/19 2042 08/15/19 0516  Weight: 86.2 kg 85.7 kg 86.5 kg    Exam:  GEN: NAD SKIN: Multiple bruises on b/l upper extremities EYES: EOMI ENT: MMM CV: Irregular rate and rhythm PULM: No wheezing or rales heard ABD: soft, ND, NT, +BS CNS: AAO x 3, non focal EXT: No edema or tenderness   Data Reviewed:   I have personally reviewed following labs and imaging studies:  Labs: Labs  show the following:   Basic Metabolic Panel: Recent Labs  Lab 08/13/19 2208 08/13/19 2208 08/14/19 0435 08/15/19 0504  NA 141  --  142 144  K 5.0   < > 4.7 4.0  CL 101  --  102 103  CO2 28  --  26 30  GLUCOSE 122*  --  179* 152*  BUN 19  --  22 24*  CREATININE 1.12*  --  1.25* 0.91  CALCIUM 9.4  --  9.5 9.4   < > = values in this interval not displayed.   GFR Estimated Creatinine Clearance: 55.4 mL/min (by C-G formula based on SCr of 0.91 mg/dL). Liver Function Tests: No results for input(s): AST, ALT, ALKPHOS, BILITOT, PROT, ALBUMIN in the last 168 hours. No results for input(s): LIPASE, AMYLASE in the last 168 hours. No results for input(s): AMMONIA in the last 168 hours. Coagulation profile No results for input(s): INR, PROTIME in the last 168 hours.  CBC: Recent Labs  Lab 08/13/19 2208 08/14/19 0435 08/15/19 0504  WBC 28.5* 31.8* 25.8*  NEUTROABS 23.8*  --  23.4*  HGB 13.8 13.1 13.6  HCT 42.6 41.0 41.1  MCV 96.2 96.5 94.9  PLT 313 293 322   Cardiac Enzymes: No results for input(s): CKTOTAL, CKMB, CKMBINDEX, TROPONINI in the last 168 hours. BNP (last 3 results) No results for input(s): PROBNP in the last 8760 hours. CBG: Recent Labs  Lab 08/14/19 1238 08/14/19 1702 08/14/19 2135 08/15/19 0804 08/15/19 1127  GLUCAP 135* 142* 137* 161* 127*   D-Dimer: No results for input(s): DDIMER in the last 72 hours. Hgb A1c: Recent Labs    08/14/19 0435  HGBA1C 6.0*   Lipid Profile: No results for input(s): CHOL, HDL, LDLCALC, TRIG, CHOLHDL, LDLDIRECT in the last 72 hours. Thyroid function studies: No results for input(s): TSH, T4TOTAL, T3FREE, THYROIDAB in the last 72 hours.  Invalid input(s): FREET3 Anemia work up: No results for input(s): VITAMINB12, FOLATE, FERRITIN, TIBC, IRON, RETICCTPCT in the last 72 hours. Sepsis Labs: Recent Labs  Lab 08/13/19 2208 08/14/19 0111 08/14/19 0435 08/15/19 0504  PROCALCITON <0.10  --   --   --   WBC 28.5*  --   31.8* 25.8*  LATICACIDVEN 2.6* 3.2*  --   --     Microbiology Recent Results (from the past 240 hour(s))  Blood Culture ID Panel (Reflexed)     Status: Abnormal   Collection Time: 08/13/19 10:00 PM  Result Value Ref Range Status   Enterococcus species NOT DETECTED NOT DETECTED Final   Listeria monocytogenes NOT DETECTED NOT DETECTED Final   Staphylococcus species NOT DETECTED NOT DETECTED Final   Staphylococcus aureus (BCID) NOT DETECTED NOT DETECTED Final   Streptococcus species DETECTED (A) NOT DETECTED Final    Comment: Not Enterococcus species, Streptococcus agalactiae, Streptococcus pyogenes, or Streptococcus pneumoniae. CRITICAL RESULT CALLED TO, READ BACK BY AND VERIFIED WITH: JASON ROBBINS AT 1501 08/14/19 SDR    Streptococcus agalactiae NOT DETECTED NOT DETECTED Final   Streptococcus pneumoniae NOT DETECTED NOT DETECTED Final   Streptococcus pyogenes NOT DETECTED NOT DETECTED Final   Acinetobacter baumannii NOT DETECTED NOT DETECTED Final   Enterobacteriaceae species NOT DETECTED NOT DETECTED Final   Enterobacter cloacae complex NOT DETECTED NOT DETECTED Final   Escherichia coli NOT DETECTED NOT DETECTED Final   Klebsiella oxytoca NOT DETECTED NOT DETECTED Final   Klebsiella pneumoniae NOT DETECTED NOT DETECTED Final   Proteus species NOT DETECTED NOT DETECTED Final   Serratia marcescens NOT DETECTED NOT DETECTED Final   Haemophilus influenzae NOT DETECTED NOT DETECTED Final   Neisseria meningitidis NOT DETECTED NOT DETECTED Final   Pseudomonas aeruginosa NOT DETECTED NOT DETECTED Final   Candida albicans NOT DETECTED NOT DETECTED Final   Candida glabrata NOT DETECTED NOT DETECTED Final   Candida krusei  NOT DETECTED NOT DETECTED Final   Candida parapsilosis NOT DETECTED NOT DETECTED Final   Candida tropicalis NOT DETECTED NOT DETECTED Final    Comment: Performed at [facility], [address] [place], [place] [address]  Culture, blood (routine x 2)      Status: None (Preliminary result)   Collection Time: 08/13/19 10:08 PM   Specimen: BLOOD  Result Value Ref Range Status   Specimen Description   Final    BLOOD LEFT ANTECUBITAL Performed at [facility], [address], [place], [place] [address]    Special Requests   Final    BOTTLES DRAWN AEROBIC AND ANAEROBIC Blood Culture adequate volume Performed at [facility], [address] [place], [place] [address]    Culture  Setup Time   Final    GRAM POSITIVE COCCI ANAEROBIC BOTTLE ONLY GRAM POSITIVE COCCI IN CHAINS AEROBIC BOTTLE ONLY CRITICAL RESULT CALLED TO, READ BACK BY AND VERIFIED WITH: JASON ROBBINS AT 1501 08/14/19 SDR    Culture   Final    GRAM POSITIVE COCCI GRAM POSITIVE COCCI IN CHAINS CULTURE REINCUBATED FOR BETTER GROWTH Performed at [facility], [place] [address] [place], [place] [address]    Report Status PENDING  Incomplete  SARS Coronavirus 2 by RT PCR (hospital order, performed in [facility] hospital lab) Nasopharyngeal Nasopharyngeal Swab     Status: None   Collection Time: 08/13/19 10:08 PM   Specimen: Nasopharyngeal Swab  Result Value Ref Range Status   SARS Coronavirus 2 NEGATIVE NEGATIVE Final    Comment: (NOTE) SARS-CoV-2 target nucleic acids are NOT DETECTED.  The SARS-CoV-2 RNA is generally detectable in upper and lower respiratory specimens during the acute phase of infection. The lowest concentration of SARS-CoV-2 viral copies this assay can detect is 250 copies / mL. A negative result does not preclude SARS-CoV-2 infection and should not be used as the sole basis for treatment or other patient management decisions.  A negative result may occur with improper specimen collection / handling, submission of specimen other than nasopharyngeal swab, presence of viral mutation(s) within the areas targeted by this assay, and inadequate number of viral copies (<250 copies / mL). A negative result must be combined with  clinical observations, patient history, and epidemiological information.  Fact Sheet for Patients:   StrictlyIdeas.no  Fact Sheet for Healthcare Providers: BankingDealers.co.za  This test is not yet approved or  cleared by the [place] FDA and has been authorized for detection and/or diagnosis of SARS-CoV-2 by FDA under an Emergency Use Authorization (EUA).  This EUA will remain in effect (meaning this test can be used) for the duration of the COVID-19 declaration under Section 564(b)(1) of the Act, 21 U.S.C. section 360bbb-3(b)(1), unless the authorization is terminated or revoked sooner.  Performed at [facility], [address] [place],  [address]   Blood Culture ID Panel (Reflexed)     Status: Abnormal   Collection Time: 08/13/19 10:08 PM  Result Value Ref Range Status   Enterococcus species NOT DETECTED NOT DETECTED Final   Listeria monocytogenes NOT DETECTED NOT DETECTED Final   Staphylococcus species DETECTED (A) NOT DETECTED Final    Comment: Methicillin (oxacillin) resistant coagulase negative staphylococcus. Possible blood culture contaminant (unless isolated from more than one blood culture draw or clinical case suggests pathogenicity). No antibiotic treatment is indicated for blood  culture contaminants. CRITICAL RESULT CALLED TO, READ BACK BY AND VERIFIED WITH:  JASON ROBBINS AT 1501 08/14/19 SDR  Staphylococcus aureus (BCID) NOT DETECTED NOT DETECTED Final   Methicillin resistance DETECTED (A) NOT DETECTED Final    Comment: CRITICAL RESULT CALLED TO, READ BACK BY AND VERIFIED WITH:  JASON ROBBINS AT 1501 08/14/19 SDR    Streptococcus species NOT DETECTED NOT DETECTED Final   Streptococcus agalactiae NOT DETECTED NOT DETECTED Final   Streptococcus pneumoniae NOT DETECTED NOT DETECTED Final   Streptococcus pyogenes NOT DETECTED NOT DETECTED Final   Acinetobacter baumannii NOT DETECTED NOT  DETECTED Final   Enterobacteriaceae species NOT DETECTED NOT DETECTED Final   Enterobacter cloacae complex NOT DETECTED NOT DETECTED Final   Escherichia coli NOT DETECTED NOT DETECTED Final   Klebsiella oxytoca NOT DETECTED NOT DETECTED Final   Klebsiella pneumoniae NOT DETECTED NOT DETECTED Final   Proteus species NOT DETECTED NOT DETECTED Final   Serratia marcescens NOT DETECTED NOT DETECTED Final   Haemophilus influenzae NOT DETECTED NOT DETECTED Final   Neisseria meningitidis NOT DETECTED NOT DETECTED Final   Pseudomonas aeruginosa NOT DETECTED NOT DETECTED Final   Candida albicans NOT DETECTED NOT DETECTED Final   Candida glabrata NOT DETECTED NOT DETECTED Final   Candida krusei NOT DETECTED NOT DETECTED Final   Candida parapsilosis NOT DETECTED NOT DETECTED Final   Candida tropicalis NOT DETECTED NOT DETECTED Final    Comment: Performed at [facility], [address] [place], [place] [address]  Culture, blood (routine x 2)     Status: None (Preliminary result)   Collection Time: 08/13/19 11:17 PM   Specimen: BLOOD  Result Value Ref Range Status   Specimen Description BLOOD RIGHT FA  Final   Special Requests   Final    BOTTLES DRAWN AEROBIC AND ANAEROBIC Blood Culture adequate volume   Culture   Final    NO GROWTH 2 DAYS Performed at [facility], [address] [place], [place] [address]    Report Status PENDING  Incomplete    Procedures and diagnostic studies:  DG Chest Portable 1 View  Result Date: 08/13/2019 CLINICAL DATA:  Short of breath, hypoxia EXAM: PORTABLE CHEST 1 VIEW COMPARISON:  04/28/2019 FINDINGS: Single frontal view of the chest demonstrates stable enlargement the cardiac silhouette. Diffuse emphysema with marked background interstitial scarring unchanged. No focal airspace disease, effusion, or pneumothorax. No acute bony abnormalities. IMPRESSION: 1. Stable emphysema, no superimposed process. Electronically Signed   By: Randa Ngo  M.D.   On: 08/13/2019 22:39               LOS: 1 day   Summer Bradley  [facility]     08/15/2019, 3:50 PM

## 2019-08-15 NOTE — Plan of Care (Signed)
  Problem: Education: Goal: Knowledge of General Education information will improve Description Including pain rating scale, medication(s)/side effects and non-pharmacologic comfort measures Outcome: Progressing   

## 2019-08-16 LAB — CBC WITH DIFFERENTIAL/PLATELET
Abs Immature Granulocytes: 0.14 10*3/uL — ABNORMAL HIGH (ref 0.00–0.07)
Basophils Absolute: 0 10*3/uL (ref 0.0–0.1)
Basophils Relative: 0 %
Eosinophils Absolute: 0 10*3/uL (ref 0.0–0.5)
Eosinophils Relative: 0 %
HCT: 39.7 % (ref 36.0–46.0)
Hemoglobin: 12.8 g/dL (ref 12.0–15.0)
Immature Granulocytes: 1 %
Lymphocytes Relative: 5 %
Lymphs Abs: 1 10*3/uL (ref 0.7–4.0)
MCH: 30.8 pg (ref 26.0–34.0)
MCHC: 32.2 g/dL (ref 30.0–36.0)
MCV: 95.7 fL (ref 80.0–100.0)
Monocytes Absolute: 0.5 10*3/uL (ref 0.1–1.0)
Monocytes Relative: 3 %
Neutro Abs: 16 10*3/uL — ABNORMAL HIGH (ref 1.7–7.7)
Neutrophils Relative %: 91 %
Platelets: 318 10*3/uL (ref 150–400)
RBC: 4.15 MIL/uL (ref 3.87–5.11)
RDW: 15.2 % (ref 11.5–15.5)
WBC: 17.6 10*3/uL — ABNORMAL HIGH (ref 4.0–10.5)
nRBC: 0 % (ref 0.0–0.2)

## 2019-08-16 LAB — GLUCOSE, CAPILLARY
Glucose-Capillary: 150 mg/dL — ABNORMAL HIGH (ref 70–99)
Glucose-Capillary: 137 mg/dL — ABNORMAL HIGH (ref 70–99)
Glucose-Capillary: 130 mg/dL — ABNORMAL HIGH (ref 70–99)
Glucose-Capillary: 196 mg/dL — ABNORMAL HIGH (ref 70–99)

## 2019-08-16 MED ORDER — POLYETHYLENE GLYCOL 3350 17 G PO PACK: 17.0000 g | PACK | Freq: Every day | ORAL | Status: DC | PRN

## 2019-08-16 MED ORDER — FAMOTIDINE 20 MG PO TABS: 20.0000 mg | ORAL_TABLET | Freq: Every day | ORAL | Status: DC

## 2019-08-16 MED ORDER — LORAZEPAM 0.5 MG PO TABS: 0.5000 mg | ORAL_TABLET | Freq: Two times a day (BID) | ORAL | Status: DC

## 2019-08-16 MED ORDER — POTASSIUM CHLORIDE CRYS ER 20 MEQ PO TBCR: 40.0000 meq | EXTENDED_RELEASE_TABLET | Freq: Two times a day (BID) | ORAL | Status: DC

## 2019-08-16 MED ORDER — QUETIAPINE FUMARATE 25 MG PO TABS
25.0000 mg | ORAL_TABLET | Freq: Every day | ORAL | Status: DC
  Administered 2019-08-16: 25 mg via ORAL
  Filled 2019-08-16: qty 1

## 2019-08-16 MED ORDER — POLYVINYL ALCOHOL 1.4 % OP SOLN
1.0000 [drp] | OPHTHALMIC | Status: DC | PRN
  Filled 2019-08-16: qty 15

## 2019-08-16 MED ORDER — CALCIUM CARBONATE-VITAMIN D 500-200 MG-UNIT PO TABS
1.0000 | ORAL_TABLET | Freq: Two times a day (BID) | ORAL | Status: DC
  Administered 2019-08-16 – 2019-08-17 (×2): 1 via ORAL
  Filled 2019-08-16 (×2): qty 1

## 2019-08-16 MED ORDER — QUETIAPINE FUMARATE 25 MG PO TABS: 25.0000 mg | ORAL_TABLET | Freq: Every day | ORAL | Status: DC

## 2019-08-16 MED ORDER — METHYLPREDNISOLONE SODIUM SUCC 40 MG IJ SOLR
40.0000 mg | Freq: Every day | INTRAMUSCULAR | Status: DC
  Administered 2019-08-17: 40 mg via INTRAVENOUS
  Filled 2019-08-16: qty 1

## 2019-08-16 MED ORDER — GUAIFENESIN-DM 100-10 MG/5ML PO SYRP: 5.0000 mL | ORAL_SOLUTION | ORAL | Status: DC | PRN

## 2019-08-16 MED ORDER — LOPERAMIDE HCL 2 MG PO CAPS: 2.0000 mg | ORAL_CAPSULE | Freq: Four times a day (QID) | ORAL | Status: DC | PRN

## 2019-08-16 MED ORDER — IPRATROPIUM-ALBUTEROL 0.5-2.5 (3) MG/3ML IN SOLN: 3.0000 mL | RESPIRATORY_TRACT | Status: DC | PRN

## 2019-08-16 MED ORDER — DOCUSATE SODIUM 100 MG PO CAPS
100.0000 mg | ORAL_CAPSULE | Freq: Every day | ORAL | Status: DC
  Filled 2019-08-16: qty 1

## 2019-08-16 MED ORDER — FLUTICASONE FUROATE-VILANTEROL 200-25 MCG/INH IN AEPB
1.0000 | INHALATION_SPRAY | Freq: Every day | RESPIRATORY_TRACT | Status: DC
  Administered 2019-08-17: 1 via RESPIRATORY_TRACT
  Filled 2019-08-16: qty 28

## 2019-08-16 MED ORDER — MELATONIN 5 MG PO TABS
5.0000 mg | ORAL_TABLET | Freq: Every day | ORAL | Status: DC
  Filled 2019-08-16: qty 1

## 2019-08-16 NOTE — Progress Notes (Signed)
PROGRESS NOTE  Summer Bradley DDU:[id number] DOB: 03/07/1945 DOA: 08/13/2019 PCP: [facility]   LOS: 2 days   Brief narrative: As per HPI,  Summer Bradley is a 74 y.o. female with medical history significant forCOPD on3L supplemental O2 via [place], atrial fibrillation on Eliquis, history of CVA, type 2 diabetes, hypertension, hyperlipidemia, hypothyroidism, and depression/anxiety who presented to the ED for evaluation of dyspnea and productive cough.  Reportedly, her oxygen saturation was 82% on 3 L of oxygen via nasal cannula.  She was placed on  oxygen via nonrebreather mask and subsequently CPAP and transported to the emergency room. She was admitted for COPD exacerbation and acute on chronic hypoxemic respiratory failure.  She was initially treated with BiPAP and transitioned to oxygen via nasal cannula. She was also treated with empiric IV antibiotics and steroids.  Assessment/Plan:  Principal Problem:   Acute on chronic respiratory failure with hypoxia (HCC) Active Problems:   Paroxysmal A-fib (HCC)   HLD (hyperlipidemia)   Essential hypertension   COPD with acute exacerbation (HCC)   Anxiety and depression   Hypothyroidism   History of CVA (cerebrovascular accident)   COPD exacerbation (HCC)   Acute on chronic respiratory failure with hypoxia due to acute COPD exacerbation: Initially required BiPAP as needed for increased work of breathing.  Continue empiric IV antibiotics, IV steroids and bronchodilators.  Currently on 4 L of oxygen by nasal cannula.    Leukocytosis, staph and strep bacteremia: Leukocytosis is trending down.  Patient has been followed by [facility] and I had an opportunity to discuss with physician who stated that she does have chronic leukocytosis. 1 blood culture bottle showed MRSA bacteremia and another blood culture also showed strep species bacteremia.  These are likely contaminants but will follow repeat blood  culture from today.  Continue empiric IV antibiotics.    CBC in a.m.  Afebrile at this time.  Paroxysmal atrial fibrillation with RVR: Heart rate is better.  Continue diltiazem, bisoprolol and Eliquis.  Hypertension: Continue diltiazem, bisoprolol   Type 2 diabetes: Hold OHA. Continue sliding scale insulin, acuchecks, diabetic diet.  Discharge POC glucose of 137  Hypothyroidism: Continue Synthroid.  History of CVA: Continue Eliquis and statin.  Hyperlipidemia: Continue statin.  Depression/anxiety: Continue Celexa, BuSpar, Ativan as needed with hold parameters.   DVT prophylaxis:  apixaban (ELIQUIS) tablet 5 mg    Code Status:  DNR   Family Communication: None today  Status is: Inpatient  Remains inpatient appropriate because:Inpatient level of care appropriate due to severity of illness, repeat blood cultures for bacteremia,    Dispo: The patient is from: Home              Anticipated d/c is to: Home              Anticipated d/c date is: 1 day, if continues to feel better, negative blood cultures.              Patient currently is not medically stable to d/c.   Consultants:  none  Procedures:  none  Antibiotics:  . Rocephin and Zithromax  Anti-infectives (From admission, onward)   Start     Dose/Rate Route Frequency Ordered Stop   08/14/19 1800  cefTRIAXone (ROCEPHIN) 2 g in sodium chloride 0.9 % 100 mL IVPB     Discontinue     2 g 200 mL/hr over 30 Minutes Intravenous Every 24 hours 08/14/19 1606     08/14/19 1600  cefTRIAXone (ROCEPHIN) 1  g in sodium chloride 0.9 % 100 mL IVPB  Status:  Discontinued        1 g 200 mL/hr over 30 Minutes Intravenous Every 24 hours 08/14/19 1551 08/14/19 1606   08/14/19 0007  azithromycin (ZITHROMAX) tablet 250 mg     Discontinue     250 mg Oral Daily 08/14/19 0007 08/18/19 0959   08/13/19 2315  cefTRIAXone (ROCEPHIN) 1 g in sodium chloride 0.9 % 100 mL IVPB        1 g 200 mL/hr over 30 Minutes Intravenous   Once 08/13/19 2304 08/13/19 2358   08/13/19 2315  azithromycin (ZITHROMAX) 500 mg in sodium chloride 0.9 % 250 mL IVPB        500 mg 250 mL/hr over 60 Minutes Intravenous  Once 08/13/19 2304 08/14/19 0058       Subjective: Today, patient was seen and examined at bedside.  Patient feels mildly short of breath and dyspneic.  She has generalized weakness.  Has not ambulated.  Objective: Vitals:   08/16/19 0219 08/16/19 0541  BP:  124/86  Pulse:  100  Resp:    Temp:    SpO2: 95% 99%    Intake/Output Summary (Last 24 hours) at 08/16/2019 0711 Last data filed at 08/16/2019 0554 Gross per 24 hour  Intake 1303.47 ml  Output 1950 ml  Net -646.53 ml   Filed Weights   08/14/19 2042 08/15/19 0516 08/16/19 0541  Weight: 85.7 kg 86.5 kg 85.5 kg   Body mass index is 34.5 kg/m.   Physical Exam: GENERAL: Patient is alert awake and oriented. Not in obvious distress. HENT: No scleral pallor or icterus. Pupils equally reactive to light. Oral mucosa is moist NECK: is supple, no gross swelling noted. CHEST: Diminished breath sounds bilaterally.  Coarse breath sounds noted CVS: S1 and S2 heard, no murmur.  Irregular rhythm.  ABDOMEN: Soft, non-tender, bowel sounds are present. EXTREMITIES: bruises upper extremities CNS: Cranial nerves are intact. No focal motor deficits. SKIN: warm and dry without rashes.  Data Review: I have personally reviewed the following laboratory data and studies,  CBC: Recent Labs  Lab 08/13/19 2208 08/14/19 0435 08/15/19 0504 08/16/19 0612  WBC 28.5* 31.8* 25.8* 17.6*  NEUTROABS 23.8*  --  23.4* 16.0*  HGB 13.8 13.1 13.6 12.8  HCT 42.6 41.0 41.1 39.7  MCV 96.2 96.5 94.9 95.7  PLT 313 293 322 284   Basic Metabolic Panel: Recent Labs  Lab 08/13/19 2208 08/14/19 0435 08/15/19 0504  NA 141 142 144  K 5.0 4.7 4.0  CL 101 102 103  CO2 28 26 30   GLUCOSE 122* 179* 152*  BUN 19 22 24*  CREATININE 1.12* 1.25* 0.91  CALCIUM 9.4 9.5 9.4   Liver  Function Tests: No results for input(s): AST, ALT, ALKPHOS, BILITOT, PROT, ALBUMIN in the last 168 hours. No results for input(s): LIPASE, AMYLASE in the last 168 hours. No results for input(s): AMMONIA in the last 168 hours. Cardiac Enzymes: No results for input(s): CKTOTAL, CKMB, CKMBINDEX, TROPONINI in the last 168 hours. BNP (last 3 results) Recent Labs    12/29/18 0521 04/28/19 1015 08/13/19 2208  BNP 518.0* 378.0* 300.8*    ProBNP (last 3 results) No results for input(s): PROBNP in the last 8760 hours.  CBG: Recent Labs  Lab 08/14/19 2135 08/15/19 0804 08/15/19 1127 08/15/19 1620 08/15/19 2113  GLUCAP 137* 161* 127* 139* 144*   Recent Results (from the past 240 hour(s))  Blood Culture ID Panel (Reflexed)  Status: Abnormal   Collection Time: 08/13/19 10:00 PM  Result Value Ref Range Status   Enterococcus species NOT DETECTED NOT DETECTED Final   Listeria monocytogenes NOT DETECTED NOT DETECTED Final   Staphylococcus species NOT DETECTED NOT DETECTED Final   Staphylococcus aureus (BCID) NOT DETECTED NOT DETECTED Final   Streptococcus species DETECTED (A) NOT DETECTED Final    Comment: Not Enterococcus species, Streptococcus agalactiae, Streptococcus pyogenes, or Streptococcus pneumoniae. CRITICAL RESULT CALLED TO, READ BACK BY AND VERIFIED WITH: JASON ROBBINS AT 1501 08/14/19 SDR    Streptococcus agalactiae NOT DETECTED NOT DETECTED Final   Streptococcus pneumoniae NOT DETECTED NOT DETECTED Final   Streptococcus pyogenes NOT DETECTED NOT DETECTED Final   Acinetobacter baumannii NOT DETECTED NOT DETECTED Final   Enterobacteriaceae species NOT DETECTED NOT DETECTED Final   Enterobacter cloacae complex NOT DETECTED NOT DETECTED Final   Escherichia coli NOT DETECTED NOT DETECTED Final   Klebsiella oxytoca NOT DETECTED NOT DETECTED Final   Klebsiella pneumoniae NOT DETECTED NOT DETECTED Final   Proteus species NOT DETECTED NOT DETECTED Final   Serratia marcescens  NOT DETECTED NOT DETECTED Final   Haemophilus influenzae NOT DETECTED NOT DETECTED Final   Neisseria meningitidis NOT DETECTED NOT DETECTED Final   Pseudomonas aeruginosa NOT DETECTED NOT DETECTED Final   Candida albicans NOT DETECTED NOT DETECTED Final   Candida glabrata NOT DETECTED NOT DETECTED Final   Candida krusei NOT DETECTED NOT DETECTED Final   Candida parapsilosis NOT DETECTED NOT DETECTED Final   Candida tropicalis NOT DETECTED NOT DETECTED Final    Comment: Performed at [facility], [address] [place], [place] [address]  Culture, blood (routine x 2)     Status: None (Preliminary result)   Collection Time: 08/13/19 10:08 PM   Specimen: BLOOD  Result Value Ref Range Status   Specimen Description   Final    BLOOD LEFT ANTECUBITAL Performed at [facility], [address], [place], [place] [address]    Special Requests   Final    BOTTLES DRAWN AEROBIC AND ANAEROBIC Blood Culture adequate volume Performed at [facility], [address] [place], [place] [address]    Culture  Setup Time   Final    GRAM POSITIVE COCCI ANAEROBIC BOTTLE ONLY GRAM POSITIVE COCCI IN CHAINS AEROBIC BOTTLE ONLY CRITICAL RESULT CALLED TO, READ BACK BY AND VERIFIED WITH: JASON ROBBINS AT 1501 08/14/19 SDR    Culture   Final    GRAM POSITIVE COCCI GRAM POSITIVE COCCI IN CHAINS CULTURE REINCUBATED FOR BETTER GROWTH Performed at [facility], [place] [address] [place], [place] [address]    Report Status PENDING  Incomplete  SARS Coronavirus 2 by RT PCR (hospital order, performed in [facility] hospital lab) Nasopharyngeal Nasopharyngeal Swab     Status: None   Collection Time: 08/13/19 10:08 PM   Specimen: Nasopharyngeal Swab  Result Value Ref Range Status   SARS Coronavirus 2 NEGATIVE NEGATIVE Final    Comment: (NOTE) SARS-CoV-2 target nucleic acids are NOT DETECTED.  The SARS-CoV-2 RNA is generally detectable in upper and lower respiratory specimens  during the acute phase of infection. The lowest concentration of SARS-CoV-2 viral copies this assay can detect is 250 copies / mL. A negative result does not preclude SARS-CoV-2 infection and should not be used as the sole basis for treatment or other patient management decisions.  A negative result may occur with improper specimen collection / handling, submission of specimen other than nasopharyngeal swab, presence of viral  mutation(s) within the areas targeted by this assay, and inadequate number of viral copies (<250 copies / mL). A negative result must be combined with clinical observations, patient history, and epidemiological information.  Fact Sheet for Patients:   StrictlyIdeas.no  Fact Sheet for Healthcare Providers: BankingDealers.co.za  This test is not yet approved or  cleared by the [place] FDA and has been authorized for detection and/or diagnosis of SARS-CoV-2 by FDA under an Emergency Use Authorization (EUA).  This EUA will remain in effect (meaning this test can be used) for the duration of the COVID-19 declaration under Section 564(b)(1) of the Act, 21 U.S.C. section 360bbb-3(b)(1), unless the authorization is terminated or revoked sooner.  Performed at [facility], [address] [place], [place] [address]   Blood Culture ID Panel (Reflexed)     Status: Abnormal   Collection Time: 08/13/19 10:08 PM  Result Value Ref Range Status   Enterococcus species NOT DETECTED NOT DETECTED Final   Listeria monocytogenes NOT DETECTED NOT DETECTED Final   Staphylococcus species DETECTED (A) NOT DETECTED Final    Comment: Methicillin (oxacillin) resistant coagulase negative staphylococcus. Possible blood culture contaminant (unless isolated from more than one blood culture draw or clinical case suggests pathogenicity). No antibiotic treatment is indicated for blood  culture contaminants. CRITICAL RESULT CALLED TO,  READ BACK BY AND VERIFIED WITH:  JASON ROBBINS AT 1501 08/14/19 SDR    Staphylococcus aureus (BCID) NOT DETECTED NOT DETECTED Final   Methicillin resistance DETECTED (A) NOT DETECTED Final    Comment: CRITICAL RESULT CALLED TO, READ BACK BY AND VERIFIED WITH:  JASON ROBBINS AT 1501 08/14/19 SDR    Streptococcus species NOT DETECTED NOT DETECTED Final   Streptococcus agalactiae NOT DETECTED NOT DETECTED Final   Streptococcus pneumoniae NOT DETECTED NOT DETECTED Final   Streptococcus pyogenes NOT DETECTED NOT DETECTED Final   Acinetobacter baumannii NOT DETECTED NOT DETECTED Final   Enterobacteriaceae species NOT DETECTED NOT DETECTED Final   Enterobacter cloacae complex NOT DETECTED NOT DETECTED Final   Escherichia coli NOT DETECTED NOT DETECTED Final   Klebsiella oxytoca NOT DETECTED NOT DETECTED Final   Klebsiella pneumoniae NOT DETECTED NOT DETECTED Final   Proteus species NOT DETECTED NOT DETECTED Final   Serratia marcescens NOT DETECTED NOT DETECTED Final   Haemophilus influenzae NOT DETECTED NOT DETECTED Final   Neisseria meningitidis NOT DETECTED NOT DETECTED Final   Pseudomonas aeruginosa NOT DETECTED NOT DETECTED Final   Candida albicans NOT DETECTED NOT DETECTED Final   Candida glabrata NOT DETECTED NOT DETECTED Final   Candida krusei NOT DETECTED NOT DETECTED Final   Candida parapsilosis NOT DETECTED NOT DETECTED Final   Candida tropicalis NOT DETECTED NOT DETECTED Final    Comment: Performed at [facility], [address] [place], [place] [address]  Culture, blood (routine x 2)     Status: None (Preliminary result)   Collection Time: 08/13/19 11:17 PM   Specimen: BLOOD  Result Value Ref Range Status   Specimen Description BLOOD RIGHT FA  Final   Special Requests   Final    BOTTLES DRAWN AEROBIC AND ANAEROBIC Blood Culture adequate volume   Culture   Final    NO GROWTH 2 DAYS Performed at [facility] [place], [address] [place], [place] [address]     Report Status PENDING  Incomplete     Studies: No results found.    Flora Lipps, MD  [facility] 08/16/2019

## 2019-08-16 NOTE — Progress Notes (Signed)
Rn updated the daughter of patient x2 today. External cath discontinue. Pt is able to stand with no assistant and sit on BSC. No shortness or breath noted. Pt will be encouraged to use the [facility]. I will continue to assess.

## 2019-08-16 NOTE — Evaluation (Signed)
Physical Therapy Evaluation Patient Details Name: Summer Bradley MRN: [id number] DOB: February 19, 1945 Today's Date: 08/16/2019   History of Present Illness  Pt is a 74 y.o. female presenting to hospital 6/27 with SOB and increased WOB; initially placed on Bipap (now on nasal cannula).  Pt admitted with acute on chronic respiratory failure with hypoxia d/t acute COPD exacerbation, leukocytosis, and paroxysmal a-fib.  PMH includes a-fib, anxiety, COPD, chronic cough, htn, stroke, vertigo, pulmonary emphysema, craniotomy, aortic aneurysm repair, knee surgery, abdominal hernia.  On 3 L home O2.  Clinical Impression  Prior to hospital admission, pt was ambulatory with 3ww; lives in 3rd floor apt with elevator access.  Currently pt is CGA with transfers and CGA with ambulation 30 feet and then (after sitting rest break) 60 feet with RW.  O2 sats 97% or greater on 4 L O2 via nasal cannula during ambulation (pt on 3.5 L O2 on wall O2 unit beginning/end of session).  Pt would benefit from skilled PT to address noted impairments and functional limitations (see below for any additional details).  Upon hospital discharge, pt would benefit from [place].    Follow Up Recommendations Home health PT;Supervision for mobility/OOB    Equipment Recommendations  Rolling walker with 5" wheels;3in1 (PT)    Recommendations for Other Services       Precautions / Restrictions Precautions Precautions: Fall Restrictions Weight Bearing Restrictions: No      Mobility  Bed Mobility Overal bed mobility: Needs Assistance Bed Mobility: Sit to Supine       Sit to supine: Modified independent (Device/Increase time);HOB elevated   General bed mobility comments: mild increased effort to perform on own  Transfers Overall transfer level: Needs assistance Equipment used: Rolling walker (2 wheeled) Transfers: Sit to/from [facility] Sit to Stand: Min guard (x3 trials from bed; x1 trial from [facility]) Stand  pivot transfers: Min guard (stand step turn bed to [facility] and back to bed)          Ambulation/Gait Ambulation/Gait assistance: Min guard Gait Distance (Feet):  (30 feet; 60 feet (all in pt's room)) Assistive device: Rolling walker (2 wheeled) Gait Pattern/deviations: Step-through pattern Gait velocity: decreased   General Gait Details: steady with RW  Stairs            Wheelchair Mobility    Modified Rankin (Stroke Patients Only)       Balance Overall balance assessment: Needs assistance Sitting-balance support: No upper extremity supported;Feet supported Sitting balance-Leahy Scale: Good Sitting balance - Comments: steady sitting reaching within BOS   Standing balance support: Single extremity supported Standing balance-Leahy Scale: Fair Standing balance comment: pt requiring at least single UE support for static standing balance                             Pertinent Vitals/Pain Pain Assessment: No/denies pain  HR WFL during sessions activities (80's to 90's bpm)    Home Living Family/patient expects to be discharged to:: Private residence     Type of Home: Apartment Home Access: Elevator     Home Layout: One level (3rd floor) Home Equipment: Other (comment) (3ww)      Prior Function Level of Independence: Independent with assistive device(s)         Comments: 1 fall about 2 months ago (fell between commode and wall in bathroom).  3 L home O2 chronic.     Hand Dominance        Extremity/Trunk  Assessment   Upper Extremity Assessment Upper Extremity Assessment: Generalized weakness    Lower Extremity Assessment Lower Extremity Assessment: Generalized weakness    Cervical / Trunk Assessment Cervical / Trunk Assessment:  (forward head/shoulders)  Communication   Communication: No difficulties  Cognition Arousal/Alertness: Awake/alert Behavior During Therapy: WFL for tasks assessed/performed Overall Cognitive Status: Within  Functional Limits for tasks assessed                                        General Comments   Nursing cleared pt for participation in physical therapy.  Pt agreeable to PT session and pt requesting to walk 2 times during session.    Exercises     Assessment/Plan    PT Assessment Patient needs continued PT services  PT Problem List Decreased strength;Decreased activity tolerance;Decreased balance;Decreased mobility;Decreased knowledge of precautions;Cardiopulmonary status limiting activity       PT Treatment Interventions DME instruction;Gait training;Functional mobility training;Therapeutic activities;Therapeutic exercise;Balance training;Patient/family education    PT Goals (Current goals can be found in the Care Plan section)  Acute Rehab PT Goals Patient Stated Goal: to go home PT Goal Formulation: With patient Time For Goal Achievement: 08/30/19 Potential to Achieve Goals: Good    Frequency Min 2X/week   Barriers to discharge        Co-evaluation               AM-PAC PT "6 Clicks" Mobility  Outcome Measure Help needed turning from your back to your side while in a flat bed without using bedrails?: None Help needed moving from lying on your back to sitting on the side of a flat bed without using bedrails?: None Help needed moving to and from a bed to a chair (including a wheelchair)?: A Little Help needed standing up from a chair using your arms (e.g., wheelchair or bedside chair)?: A Little Help needed to walk in hospital room?: A Little Help needed climbing 3-5 steps with a railing? : A Lot 6 Click Score: 19    End of Session Equipment Utilized During Treatment: Gait belt;Oxygen Activity Tolerance: Other (comment) (Limited d/t SOB) Patient left: in bed;with call bell/phone within reach;with bed alarm set Nurse Communication: Mobility status;Precautions PT Visit Diagnosis: Other abnormalities of gait and mobility (R26.89);Muscle weakness  (generalized) (M62.81);History of falling (Z91.81)    Time: 5035-4656 PT Time Calculation (min) (ACUTE ONLY): 32 min   Charges:   PT Evaluation $PT Eval Low Complexity: 1 Low PT Treatments $Therapeutic Activity: 23-37 mins       Leitha Bleak, PT 08/16/19, 4:10 PM

## 2019-08-17 DIAGNOSIS — E785 Hyperlipidemia, unspecified: Secondary | ICD-10-CM

## 2019-08-17 DIAGNOSIS — E039 Hypothyroidism, unspecified: Secondary | ICD-10-CM

## 2019-08-17 LAB — CBC
HCT: 38.7 % (ref 36.0–46.0)
Hemoglobin: 12.5 g/dL (ref 12.0–15.0)
MCH: 30.9 pg (ref 26.0–34.0)
MCHC: 32.3 g/dL (ref 30.0–36.0)
MCV: 95.8 fL (ref 80.0–100.0)
Platelets: 290 10*3/uL (ref 150–400)
RBC: 4.04 MIL/uL (ref 3.87–5.11)
RDW: 15 % (ref 11.5–15.5)
WBC: 13.9 10*3/uL — ABNORMAL HIGH (ref 4.0–10.5)
nRBC: 0 % (ref 0.0–0.2)

## 2019-08-17 LAB — MAGNESIUM: Magnesium: 2.2 mg/dL (ref 1.7–2.4)

## 2019-08-17 LAB — BASIC METABOLIC PANEL
Anion gap: 7 (ref 5–15)
BUN: 29 mg/dL — ABNORMAL HIGH (ref 8–23)
CO2: 34 mmol/L — ABNORMAL HIGH (ref 22–32)
Calcium: 8.9 mg/dL (ref 8.9–10.3)
Chloride: 102 mmol/L (ref 98–111)
Creatinine, Ser: 0.88 mg/dL (ref 0.44–1.00)
GFR calc Af Amer: >60 mL/min (ref >60)
GFR calc non Af Amer: >60 mL/min (ref >60)
Glucose, Bld: 139 mg/dL — ABNORMAL HIGH (ref 70–99)
Potassium: 4 mmol/L (ref 3.5–5.1)
Sodium: 143 mmol/L (ref 135–145)

## 2019-08-17 LAB — PHOSPHORUS: Phosphorus: 3.3 mg/dL (ref 2.5–4.6)

## 2019-08-17 LAB — GLUCOSE, CAPILLARY
Glucose-Capillary: 130 mg/dL — ABNORMAL HIGH (ref 70–99)
Glucose-Capillary: 129 mg/dL — ABNORMAL HIGH (ref 70–99)

## 2019-08-17 MED ORDER — PREDNISONE 10 MG PO TABS
40.0000 mg | ORAL_TABLET | Freq: Every day | ORAL | 0 refills | Status: AC
Start: 2019-08-17 — End: 2019-08-21

## 2019-08-17 MED ORDER — FUROSEMIDE 20 MG PO TABS: 40.0000 mg | ORAL_TABLET | ORAL | Status: DC

## 2019-08-17 MED ORDER — AMOXICILLIN-POT CLAVULANATE 875-125 MG PO TABS
1.0000 | ORAL_TABLET | Freq: Two times a day (BID) | ORAL | 0 refills | Status: AC
Start: 2019-08-17 — End: 2019-08-22

## 2019-08-17 NOTE — Discharge Summary (Signed)
Physician Discharge Summary  Summer Bradley [id number] DOB: 1945-03-11 DOA: 08/13/2019  PCP: [place] date: 08/13/2019 Discharge date: 08/17/2019  Admitted From: Home  Discharge disposition:home   Recommendations for Outpatient Follow-Up:   . Follow up with your primary care provider in 3 to 5 days . Check CBC, BMP, magnesium in the next visit . Patient has been prescribed prednisone and Augmentin to complete the course.   Discharge Diagnosis:   Principal Problem:   Acute on chronic respiratory failure with hypoxia (HCC) Active Problems:   Paroxysmal A-fib (HCC)   HLD (hyperlipidemia)   Essential hypertension   COPD with acute exacerbation (HCC)   Anxiety and depression   Hypothyroidism   History of CVA (cerebrovascular accident)   COPD exacerbation ([place])    Discharge Condition: Improved.  Diet recommendation: Low sodium, heart healthy.    Wound care: None.  Code status: DNR   History of Present Illness:   Summer Bradley a 74 y.o.femalewith medical history significant forCOPD on3L supplemental O2 via [place], atrial fibrillation on Eliquis, history of CVA, type 2 diabetes, hypertension, hyperlipidemia, hypothyroidism, and depression/anxiety who presented to the ED for evaluation of dyspnea and productive cough. Reportedly, her oxygen saturation was 82% on 3 L of oxygen via nasal cannula. She was placed on oxygen via nonrebreather mask and subsequently CPAP and transported to the emergency room. She was admitted for COPD exacerbation and acute on chronic hypoxemic respiratory failure. She was initially treated with BiPAP and transitioned to oxygen via nasal cannula. She was also treated with empiric IV antibiotics and steroids.  Patient was then admitted to hospital.   Hospital Course:   Following conditions were addressed during hospitalization as listed below,  Acute on chronic respiratory failure with hypoxia due to  acute COPD exacerbation: Initially, required BiPAP as needed for increased work of breathing. Continue empiric IV antibiotics, IV steroids and bronchodilators.  Currently on 3 L of oxygen by nasal cannula and is at baseline.    Leukocytosis, staph and strep bacteremia: Patient has mild leukocytosis but has history of chronic leukocytosis.1 blood culture bottle showed MRSA bacteremia and another blood culture also showed strep species bacteremia. These are likely contaminants. Repeat blood cultures negative in 24 hours.  No fever.  Will continue Augmentin to complete the course on discharge..  Paroxysmal atrial fibrillation with RVR: Improved.  Rate controlled at this time. Continue diltiazem, bisoprolol and Eliquis.  Hypertension: Continue diltiazem, bisoprolol .  Blood pressure of 124/87 prior to discharge  Type 2 diabetes: Continue oral hypoglycemic agents on discharge  Hypothyroidism: Continue Synthroid.  History of CVA: Continue Eliquis and statin.  Hyperlipidemia: Continue statin.  Depression/anxiety: Continue Celexa, BuSpar, Ativan  Disposition.  At this time, patient is stable for disposition home.  I also spoke with the patient's daughter on the phone Ms. Summer Bradley and inform her about the plan for disposition today.  Spoke with physician from [place] yesterday regarding disposition.  Medical Consultants:    None.  Procedures:    None Subjective:   Today, patient feels better.  Denies chest pain, fever, chills or rigor.  Has mild dyspnea but at baseline.  Discharge Exam:   Vitals:   08/17/19 0742 08/17/19 0807  BP: 124/87   Pulse: 78   Resp: 18   Temp: 97.9 F (36.6 C)   SpO2: 100% 98%   Vitals:   08/16/19 2111 08/17/19 0553 08/17/19 0742 08/17/19 0807  BP:  (!) 138/99 124/87  Pulse:  76 78   Resp:  20 18   Temp:  98.5 F (36.9 C) 97.9 F (36.6 C)   TempSrc:   Oral   SpO2: 99% 98% 100% 98%  Weight:      Height:         General: Alert awake, not in obvious distress, on nasal cannula oxygen HENT: pupils equally reacting to light,  No scleral pallor or icterus noted. Oral mucosa is moist.  Chest: Diminished breath sounds noted.  No overt wheezes noted CVS: S1 &S2 heard. No murmur.  Irregular rhythm Abdomen: Soft, nontender, nondistended.  Bowel sounds are heard.   Extremities: No cyanosis, clubbing or edema.  Peripheral pulses are palpable.  Bruises upper extremity Psych: Alert, awake and oriented, normal mood CNS:  No cranial nerve deficits.  Power equal in all extremities.   Skin: Warm and dry.  No rashes noted.  The results of significant diagnostics from this hospitalization (including imaging, microbiology, ancillary and laboratory) are listed below for reference.     Diagnostic Studies:   DG Chest Portable 1 View  Result Date: 08/13/2019 CLINICAL DATA:  Short of breath, hypoxia EXAM: PORTABLE CHEST 1 VIEW COMPARISON:  04/28/2019 FINDINGS: Single frontal view of the chest demonstrates stable enlargement the cardiac silhouette. Diffuse emphysema with marked background interstitial scarring unchanged. No focal airspace disease, effusion, or pneumothorax. No acute bony abnormalities. IMPRESSION: 1. Stable emphysema, no superimposed process. Electronically Signed   By: Randa Ngo M.D.   On: 08/13/2019 22:39     Labs:   Basic Metabolic Panel: Recent Labs  Lab 08/13/19 2208 08/13/19 2208 08/14/19 0435 08/14/19 0435 08/15/19 0504 08/17/19 0438  NA 141  --  142  --  144 143  K 5.0   < > 4.7   < > 4.0 4.0  CL 101  --  102  --  103 102  CO2 28  --  26  --  30 34*  GLUCOSE 122*  --  179*  --  152* 139*  BUN 19  --  22  --  24* 29*  CREATININE 1.12*  --  1.25*  --  0.91 0.88  CALCIUM 9.4  --  9.5  --  9.4 8.9  MG  --   --   --   --   --  2.2  PHOS  --   --   --   --   --  3.3   < > = values in this interval not displayed.   GFR Estimated Creatinine Clearance: 56.9 mL/min (by C-G formula  based on SCr of 0.88 mg/dL). Liver Function Tests: No results for input(s): AST, ALT, ALKPHOS, BILITOT, PROT, ALBUMIN in the last 168 hours. No results for input(s): LIPASE, AMYLASE in the last 168 hours. No results for input(s): AMMONIA in the last 168 hours. Coagulation profile No results for input(s): INR, PROTIME in the last 168 hours.  CBC: Recent Labs  Lab 08/13/19 2208 08/14/19 0435 08/15/19 0504 08/16/19 0612 08/17/19 0438  WBC 28.5* 31.8* 25.8* 17.6* 13.9*  NEUTROABS 23.8*  --  23.4* 16.0*  --   HGB 13.8 13.1 13.6 12.8 12.5  HCT 42.6 41.0 41.1 39.7 38.7  MCV 96.2 96.5 94.9 95.7 95.8  PLT 313 293 322 318 290   Cardiac Enzymes: No results for input(s): CKTOTAL, CKMB, CKMBINDEX, TROPONINI in the last 168 hours. BNP: Invalid input(s): POCBNP CBG: Recent Labs  Lab 08/16/19 0733 08/16/19 1144 08/16/19 1621 08/16/19 2112 08/17/19 0742  GLUCAP  150* 137* 130* 196* 130*   D-Dimer No results for input(s): DDIMER in the last 72 hours. Hgb A1c No results for input(s): HGBA1C in the last 72 hours. Lipid Profile No results for input(s): CHOL, HDL, LDLCALC, TRIG, CHOLHDL, LDLDIRECT in the last 72 hours. Thyroid function studies No results for input(s): TSH, T4TOTAL, T3FREE, THYROIDAB in the last 72 hours.  Invalid input(s): FREET3 Anemia work up No results for input(s): VITAMINB12, FOLATE, FERRITIN, TIBC, IRON, RETICCTPCT in the last 72 hours. Microbiology Recent Results (from the past 240 hour(s))  Blood Culture ID Panel (Reflexed)     Status: Abnormal   Collection Time: 08/13/19 10:00 PM  Result Value Ref Range Status   Enterococcus species NOT DETECTED NOT DETECTED Final   Listeria monocytogenes NOT DETECTED NOT DETECTED Final   Staphylococcus species NOT DETECTED NOT DETECTED Final   Staphylococcus aureus (BCID) NOT DETECTED NOT DETECTED Final   Streptococcus species DETECTED (A) NOT DETECTED Final    Comment: Not Enterococcus species, Streptococcus agalactiae,  Streptococcus pyogenes, or Streptococcus pneumoniae. CRITICAL RESULT CALLED TO, READ BACK BY AND VERIFIED WITH: JASON ROBBINS AT 1501 08/14/19 SDR    Streptococcus agalactiae NOT DETECTED NOT DETECTED Final   Streptococcus pneumoniae NOT DETECTED NOT DETECTED Final   Streptococcus pyogenes NOT DETECTED NOT DETECTED Final   Acinetobacter baumannii NOT DETECTED NOT DETECTED Final   Enterobacteriaceae species NOT DETECTED NOT DETECTED Final   Enterobacter cloacae complex NOT DETECTED NOT DETECTED Final   Escherichia coli NOT DETECTED NOT DETECTED Final   Klebsiella oxytoca NOT DETECTED NOT DETECTED Final   Klebsiella pneumoniae NOT DETECTED NOT DETECTED Final   Proteus species NOT DETECTED NOT DETECTED Final   Serratia marcescens NOT DETECTED NOT DETECTED Final   Haemophilus influenzae NOT DETECTED NOT DETECTED Final   Neisseria meningitidis NOT DETECTED NOT DETECTED Final   Pseudomonas aeruginosa NOT DETECTED NOT DETECTED Final   Candida albicans NOT DETECTED NOT DETECTED Final   Candida glabrata NOT DETECTED NOT DETECTED Final   Candida krusei NOT DETECTED NOT DETECTED Final   Candida parapsilosis NOT DETECTED NOT DETECTED Final   Candida tropicalis NOT DETECTED NOT DETECTED Final    Comment: Performed at [facility], [address] [place], [place] [address]  Culture, blood (routine x 2)     Status: None (Preliminary result)   Collection Time: 08/13/19 10:08 PM   Specimen: BLOOD  Result Value Ref Range Status   Specimen Description   Final    BLOOD LEFT ANTECUBITAL Performed at [facility], [address], [place], [place] [address]    Special Requests   Final    BOTTLES DRAWN AEROBIC AND ANAEROBIC Blood Culture adequate volume Performed at [facility], [address] [place], [place] [address]    Culture  Setup Time   Final    GRAM POSITIVE COCCI ANAEROBIC BOTTLE ONLY GRAM POSITIVE COCCI IN CHAINS AEROBIC BOTTLE ONLY CRITICAL RESULT CALLED TO,  READ BACK BY AND VERIFIED WITH: JASON ROBBINS AT 1501 08/14/19 SDR    Culture   Final    GRAM POSITIVE COCCI GRAM POSITIVE COCCI IN CHAINS STAPHYLOCOCCUS SPECIES (COAGULASE NEGATIVE) THE SIGNIFICANCE OF ISOLATING THIS ORGANISM FROM A SINGLE SET OF BLOOD CULTURES WHEN MULTIPLE SETS ARE DRAWN IS UNCERTAIN. PLEASE NOTIFY THE MICROBIOLOGY DEPARTMENT WITHIN ONE WEEK IF SPECIATION AND SENSITIVITIES ARE REQUIRED. CULTURE REINCUBATED FOR BETTER GROWTH Performed at [facility], [place] [address], [place], [place] [address]    Report Status PENDING  Incomplete  SARS  Coronavirus 2 by RT PCR (hospital order, performed in [facility] hospital lab) Nasopharyngeal Nasopharyngeal Swab     Status: None   Collection Time: 08/13/19 10:08 PM   Specimen: Nasopharyngeal Swab  Result Value Ref Range Status   SARS Coronavirus 2 NEGATIVE NEGATIVE Final    Comment: (NOTE) SARS-CoV-2 target nucleic acids are NOT DETECTED.  The SARS-CoV-2 RNA is generally detectable in upper and lower respiratory specimens during the acute phase of infection. The lowest concentration of SARS-CoV-2 viral copies this assay can detect is 250 copies / mL. A negative result does not preclude SARS-CoV-2 infection and should not be used as the sole basis for treatment or other patient management decisions.  A negative result may occur with improper specimen collection / handling, submission of specimen other than nasopharyngeal swab, presence of viral mutation(s) within the areas targeted by this assay, and inadequate number of viral copies (<250 copies / mL). A negative result must be combined with clinical observations, patient history, and epidemiological information.  Fact Sheet for Patients:   StrictlyIdeas.no  Fact Sheet for Healthcare Providers: BankingDealers.co.za  This test is not yet approved or  cleared by the [place] FDA and has been authorized for detection  and/or diagnosis of SARS-CoV-2 by FDA under an Emergency Use Authorization (EUA).  This EUA will remain in effect (meaning this test can be used) for the duration of the COVID-19 declaration under Section 564(b)(1) of the Act, 21 U.S.C. section 360bbb-3(b)(1), unless the authorization is terminated or revoked sooner.  Performed at [facility], [address] [place], [place] [address]   Blood Culture ID Panel (Reflexed)     Status: Abnormal   Collection Time: 08/13/19 10:08 PM  Result Value Ref Range Status   Enterococcus species NOT DETECTED NOT DETECTED Final   Listeria monocytogenes NOT DETECTED NOT DETECTED Final   Staphylococcus species DETECTED (A) NOT DETECTED Final    Comment: Methicillin (oxacillin) resistant coagulase negative staphylococcus. Possible blood culture contaminant (unless isolated from more than one blood culture draw or clinical case suggests pathogenicity). No antibiotic treatment is indicated for blood  culture contaminants. CRITICAL RESULT CALLED TO, READ BACK BY AND VERIFIED WITH:  JASON ROBBINS AT 1501 08/14/19 SDR    Staphylococcus aureus (BCID) NOT DETECTED NOT DETECTED Final   Methicillin resistance DETECTED (A) NOT DETECTED Final    Comment: CRITICAL RESULT CALLED TO, READ BACK BY AND VERIFIED WITH:  JASON ROBBINS AT 1501 08/14/19 SDR    Streptococcus species NOT DETECTED NOT DETECTED Final   Streptococcus agalactiae NOT DETECTED NOT DETECTED Final   Streptococcus pneumoniae NOT DETECTED NOT DETECTED Final   Streptococcus pyogenes NOT DETECTED NOT DETECTED Final   Acinetobacter baumannii NOT DETECTED NOT DETECTED Final   Enterobacteriaceae species NOT DETECTED NOT DETECTED Final   Enterobacter cloacae complex NOT DETECTED NOT DETECTED Final   Escherichia coli NOT DETECTED NOT DETECTED Final   Klebsiella oxytoca NOT DETECTED NOT DETECTED Final   Klebsiella pneumoniae NOT DETECTED NOT DETECTED Final   Proteus species NOT DETECTED NOT  DETECTED Final   Serratia marcescens NOT DETECTED NOT DETECTED Final   Haemophilus influenzae NOT DETECTED NOT DETECTED Final   Neisseria meningitidis NOT DETECTED NOT DETECTED Final   Pseudomonas aeruginosa NOT DETECTED NOT DETECTED Final   Candida albicans NOT DETECTED NOT DETECTED Final   Candida glabrata NOT DETECTED NOT DETECTED Final   Candida krusei NOT DETECTED NOT DETECTED Final   Candida parapsilosis NOT DETECTED NOT DETECTED Final   Candida tropicalis  NOT DETECTED NOT DETECTED Final    Comment: Performed at [facility], [address] [place], [place] [address]  Culture, blood (routine x 2)     Status: None (Preliminary result)   Collection Time: 08/13/19 11:17 PM   Specimen: BLOOD  Result Value Ref Range Status   Specimen Description BLOOD RIGHT FA  Final   Special Requests   Final    BOTTLES DRAWN AEROBIC AND ANAEROBIC Blood Culture adequate volume   Culture   Final    NO GROWTH 4 DAYS Performed at [facility], [address], [place], [place] [address]    Report Status PENDING  Incomplete  CULTURE, BLOOD (ROUTINE X 2) w Reflex to ID Panel     Status: None (Preliminary result)   Collection Time: 08/16/19  6:12 AM   Specimen: BLOOD  Result Value Ref Range Status   Specimen Description BLOOD RIGHT ANTECUBITAL  Final   Special Requests   Final    BOTTLES DRAWN AEROBIC AND ANAEROBIC Blood Culture adequate volume   Culture   Final    NO GROWTH 1 DAY Performed at [facility], [address], [place], [name] [address]    Report Status PENDING  Incomplete  CULTURE, BLOOD (ROUTINE X 2) w Reflex to ID Panel     Status: None (Preliminary result)   Collection Time: 08/16/19  6:25 AM   Specimen: BLOOD  Result Value Ref Range Status   Specimen Description BLOOD BLOOD RIGHT HAND  Final   Special Requests   Final    BOTTLES DRAWN AEROBIC AND ANAEROBIC Blood Culture adequate volume   Culture   Final    NO GROWTH 1 DAY Performed at [facility], [address] [place], [place] [address]    Report Status PENDING  Incomplete     Discharge Instructions:   Discharge Instructions    Diet - low sodium heart healthy   Complete by: As directed    Discharge instructions   Complete by: As directed    Continue antibiotic and prednisone as prescribed.  Continue oxygen at home.  Continue physical therapy after discharge.  Follow-up with your primary care provider in 3 to 5 days.  No overexertion.  Continue to use nebulizer/inhalers at home.   Increase activity slowly   Complete by: As directed      Allergies as of 08/17/2019      Reactions   Lisinopril Cough      Medication List    TAKE these medications   albuterol 108 (90 Base) MCG/ACT inhaler Commonly known as: VENTOLIN HFA Inhale 2 puffs into the lungs every 4 (four) hours as needed for wheezing or shortness of breath.   albuterol (2.5 MG/3ML) 0.083% nebulizer solution Commonly known as: PROVENTIL Take 2.5 mg by nebulization every 6 (six) hours as needed for wheezing or shortness of breath.   amoxicillin-clavulanate 875-125 MG tablet Commonly known as: Augmentin Take 1 tablet by mouth 2 (two) times daily for 5 days.   apixaban 5 MG Tabs tablet Commonly known as: ELIQUIS Take 1 tablet (5 mg total) by mouth 2 (two) times daily.   bisoprolol 5 MG tablet Commonly known as: ZEBETA Take 0.5 tablets (2.5 mg total) by mouth 2 (two) times daily.   budesonide-formoterol 160-4.5 MCG/ACT inhaler Commonly known as: SYMBICORT Inhale 2 puffs into the lungs 2 (two) times daily.   busPIRone 15 MG tablet Commonly known as: BUSPAR Take 15 mg by mouth 2 (two) times daily.  Calcium Carbonate-Vitamin D 600-400 MG-UNIT tablet Take 1 tablet by mouth 2 (two) times daily.   citalopram 40 MG tablet Commonly known as: CELEXA Take 40 mg by mouth at bedtime.   Combivent Respimat 20-100 MCG/ACT Aers respimat Generic drug: Ipratropium-Albuterol Inhale 2 puffs into the  lungs as needed for wheezing.   dextromethorphan-guaiFENesin 10-100 MG/5ML liquid Commonly known as: ROBITUSSIN-DM Take 5 mLs by mouth every 4 (four) hours as needed for cough.   diltiazem 300 MG 24 hr capsule Commonly known as: CARDIZEM CD Take 1 capsule (300 mg total) by mouth daily.   docusate sodium 100 MG capsule Commonly known as: COLACE Take 100 mg by mouth daily.   EUCERIN EX Apply 1 application topically 2 (two) times daily as needed.   famotidine 20 MG tablet Commonly known as: PEPCID Take 1 tablet (20 mg total) by mouth daily.   furosemide 20 MG tablet Commonly known as: LASIX Take 2 tablets (40 mg total) by mouth every morning.   Imodium A-D 2 MG tablet Generic drug: loperamide Take 2 mg by mouth 4 (four) times daily as needed for diarrhea or loose stools.   levothyroxine 175 MCG tablet Commonly known as: SYNTHROID Take 150 mcg by mouth daily before breakfast.   LORazepam 0.5 MG tablet Commonly known as: ATIVAN Take 0.5 mg by mouth 2 (two) times daily.   melatonin 3 MG Tabs tablet Take 1.5-3 mg by mouth at bedtime.   metFORMIN 500 MG tablet Commonly known as: GLUCOPHAGE Take 500 mg by mouth 2 (two) times daily with a meal.   metolazone 2.5 MG tablet Commonly known as: ZAROXOLYN Take 2.5 mg by mouth once a week. On Wednesdays with furosemide in the am   nystatin 100000 UNIT/ML suspension Commonly known as: MYCOSTATIN Take 5 mLs (500,000 Units total) by mouth 4 (four) times daily.   OXYGEN Inhale 2 L into the lungs continuous.   polyethylene glycol 17 g packet Commonly known as: MIRALAX / GLYCOLAX Take 17 g by mouth daily as needed.   potassium chloride SA 20 MEQ tablet Commonly known as: KLOR-CON Take 2 tablets (40 mEq total) by mouth 2 (two) times daily.   predniSONE 10 MG tablet Commonly known as: DELTASONE Take 4 tablets (40 mg total) by mouth daily for 4 days.   QUEtiapine 25 MG tablet Commonly known as: SEROQUEL Take 1 tablet (25 mg  total) by mouth at bedtime as needed.   Spiriva Respimat 2.5 MCG/ACT Aers Generic drug: Tiotropium Bromide Monohydrate Inhale 1 puff into the lungs daily.   SYSTANE ULTRA OP Place 1 drop into both eyes every 4 (four) hours as needed (dry eyes).       Follow-up [facility] Follow up.   Why: regular follow up in 2-3 days Contact information: [address] [place] [address] [phone]                Time coordinating discharge: 39 minutes  Signed:  Janssen Zee  [facility] 08/17/2019, 10:47 AM

## 2019-08-17 NOTE — Care Management Important Message (Signed)
Important Message  Patient Details  Name: Lonie Rummell MRN: [id number] Date of Birth: 12-12-1945   Medicare Important Message Given:  No  Patient discharged prior to arrival to unit to deliver concurrent Medicare IM.  Original reviewed by Patient Access Associate on 08/15/2019 at 10:58am.     [name] 08/17/2019, 2:02 PM

## 2019-08-17 NOTE — Plan of Care (Signed)
  Problem: Education: Goal: Knowledge of General Education information will improve Description: Including pain rating scale, medication(s)/side effects and non-pharmacologic comfort measures Outcome: Progressing   Problem: Clinical Measurements: Goal: Respiratory complications will improve Outcome: Progressing   Problem: Activity: Goal: Risk for activity intolerance will decrease Outcome: Progressing   Problem: Coping: Goal: Level of anxiety will decrease Outcome: Progressing   Problem: Safety: Goal: Ability to remain free from injury will improve Outcome: Progressing   Problem: Skin Integrity: Goal: Risk for impaired skin integrity will decrease Outcome: Progressing

## 2019-08-17 NOTE — TOC Initial Note (Signed)
Transition of Care [facility]) - Initial/Assessment Note    Patient Details  Name: Summer Bradley MRN: [id number] Date of Birth: [birth year]  Transition of Care [facility]) CM/SW Contact:    Eileen Stanford, LCSW Phone Number: 08/17/2019, 12:17 PM  Clinical Narrative:  CSW has attempted to reach pt's family members. Left voicemails. Also unable to [employer] as no number is indicated in the chart. RN states pt is from the [place], [place] spoke with facility. Facility will pick pt up today. Pt will resume PT through [employer].              Expected Discharge Plan: [place] Barriers to Discharge: No Barriers Identified   Patient Goals and CMS Choice        Expected Discharge Plan and Services Expected Discharge Plan: [place] In-house Referral: Clinical Social Work   Post Acute Care Choice: [place] arrangements for the past 2 months: Single Family Home Expected Discharge Date: 08/17/19                                    Prior Living Arrangements/Services Living arrangements for the past 2 months: Single Family Home Lives with:: Siblings Patient language and need for interpreter reviewed:: Yes        Need for Family Participation in Patient Care: Yes (Comment) Care giver support system in place?: Yes (comment)   Criminal Activity/Legal Involvement Pertinent to Current Situation/Hospitalization: No - Comment as needed  Activities of Daily Living Home Assistive Devices/Equipment: Wheelchair, Environmental consultant (specify type) ADL Screening (condition at time of admission) Patient's cognitive ability adequate to safely complete daily activities?: Yes Is the patient deaf or have difficulty hearing?: Yes Does the patient have difficulty seeing, even when wearing glasses/contacts?: No Does the patient have difficulty concentrating, remembering, or making decisions?: No Patient able to express need for assistance with ADLs?: Yes Does the  patient have difficulty dressing or bathing?: Yes Independently performs ADLs?: No Communication: Needs assistance Is this a change from baseline?: Change from baseline, expected to last <3 days Dressing (OT): Needs assistance Is this a change from baseline?: Change from baseline, expected to last <3days Grooming: Needs assistance Is this a change from baseline?: Change from baseline, expected to last <3 days Feeding: Needs assistance Is this a change from baseline?: Change from baseline, expected to last <3 days Bathing: Needs assistance Is this a change from baseline?: Change from baseline, expected to last <3 days Toileting: Needs assistance Is this a change from baseline?: Change from baseline, expected to last <3 days In/Out Bed: Needs assistance Is this a change from baseline?: Change from baseline, expected to last <3 days Walks in Home: Independent with device (comment) Does the patient have difficulty walking or climbing stairs?: Yes Weakness of Legs: Both Weakness of Arms/Hands: Both  Permission Sought/Granted Permission sought to share information with : Family Supports                Emotional Assessment Appearance:: Appears stated age Attitude/Demeanor/Rapport: Unable to Assess Affect (typically observed): Unable to Assess Orientation: : Oriented to Self, Oriented to Place Alcohol / Substance Use: Not Applicable Psych Involvement: No (comment)  Admission diagnosis:  Respiratory distress [R06.03] COPD exacerbation (HCC) [J44.1] Acute on chronic respiratory failure with hypoxia ([place]) [J96.21] Patient Active Problem List   Diagnosis Date Noted  . COPD exacerbation ([place]) 08/14/2019  . Acute on  chronic respiratory failure with hypoxia ([place]) 08/13/2019  . Hypothyroidism 08/13/2019  . History of CVA (cerebrovascular accident) 08/13/2019  . Palliative care by specialist   . Goals of care, counseling/discussion   . Acute delirium   . Atrial fibrillation with rapid  ventricular response ([place])   . Anxiety and depression   . Chronic heart failure with preserved ejection fraction (HFpEF) ([place])   . Pulmonary hypertension, unspecified ([place])   . Acute on chronic respiratory failure with hypoxemia ([place]) 02/03/2018  . Pulmonary emphysema ([place]) 04/25/2015  . Emphysema of lung ([place]) 05/09/2014  . 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 Hyperlipidemia 05/09/2014  . GI bleed   . Benign neoplasm of colon 03/03/2014  . Diverticulosis of colon without hemorrhage 03/03/2014  . 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 Jejunal ulcer 03/03/2014  . Gastric polyps 03/03/2014  . Acute posthemorrhagic anemia 03/02/2014  . Melena 02/27/2014  . LGI bleed   . Acute on chronic respiratory failure ([place]) 02/16/2014  . Tracheostomy status ([place]) 02/16/2014  . Aspiration pneumonitis ([place])   . Spontaneous pneumothorax   . Thrush   . COPD with acute exacerbation ([place])   . SOB (shortness of breath)   . HLD (hyperlipidemia)   . Essential hypertension   . Dysphagia, pharyngoesophageal phase   . Acute tracheobronchitis 02/04/2014  . Atelectasis of left lung 02/04/2014  . Wheezing   . Acute respiratory failure ([place])   . Paroxysmal A-fib ([place])   . Cerebral infarction due to occlusion or stenosis of precerebral artery ([place])   . 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[place])   . Stroke ([place]) 01/29/2014  . Respiratory failure [facility]) 01/29/2014   PCP:  [facility]:   [place], [place] [address] Phone: [phone] Fax: [phone]  [place] [address], [place] - [place] [address] Phone: [phone] Fax: [place], [place] [address] [place] [address] Phone: [phone] Fax: [phone]     Social Determinants of Health (SDOH) Interventions    Readmission Risk Interventions No flowsheet data found.

## 2019-08-18 LAB — CULTURE, BLOOD (ROUTINE X 2)
Culture: NO GROWTH
Special Requests: ADEQUATE
Special Requests: ADEQUATE

## 2019-08-21 LAB — CULTURE, BLOOD (ROUTINE X 2)
Culture: NO GROWTH
Culture: NO GROWTH
Special Requests: ADEQUATE
Special Requests: ADEQUATE

## 2019-09-19 ENCOUNTER — Emergency Department: Payer: Medicare (Managed Care)

## 2019-09-19 ENCOUNTER — Encounter: Payer: Self-pay | Admitting: Emergency Medicine

## 2019-09-19 ENCOUNTER — Inpatient Hospital Stay
Admission: EM | Admit: 2019-09-19 | Discharge: 2019-09-21 | DRG: 190 | Disposition: A | Discharge status: Home Health | Payer: Medicare (Managed Care) | Attending: Internal Medicine | Admitting: Internal Medicine

## 2019-09-19 ENCOUNTER — Other Ambulatory Visit: Payer: Self-pay

## 2019-09-19 DIAGNOSIS — Z8673 Personal history of transient ischemic attack (TIA), and cerebral infarction without residual deficits: Secondary | ICD-10-CM

## 2019-09-19 DIAGNOSIS — S81012A Laceration without foreign body, left knee, initial encounter: Secondary | ICD-10-CM | POA: Diagnosis present

## 2019-09-19 DIAGNOSIS — W19XXXA Unspecified fall, initial encounter: Secondary | ICD-10-CM | POA: Diagnosis not present

## 2019-09-19 DIAGNOSIS — Z20822 Contact with and (suspected) exposure to covid-19: Secondary | ICD-10-CM | POA: Diagnosis present

## 2019-09-19 DIAGNOSIS — I639 Cerebral infarction, unspecified: Secondary | ICD-10-CM | POA: Diagnosis present

## 2019-09-19 DIAGNOSIS — R531 Weakness: Secondary | ICD-10-CM

## 2019-09-19 DIAGNOSIS — F419 Anxiety disorder, unspecified: Secondary | ICD-10-CM

## 2019-09-19 DIAGNOSIS — Z79890 Hormone replacement therapy (postmenopausal): Secondary | ICD-10-CM | POA: Diagnosis not present

## 2019-09-19 DIAGNOSIS — W010XXA Fall on same level from slipping, tripping and stumbling without subsequent striking against object, initial encounter: Secondary | ICD-10-CM | POA: Diagnosis present

## 2019-09-19 DIAGNOSIS — I482 Chronic atrial fibrillation, unspecified: Secondary | ICD-10-CM | POA: Diagnosis present

## 2019-09-19 DIAGNOSIS — J9621 Acute and chronic respiratory failure with hypoxia: Secondary | ICD-10-CM | POA: Diagnosis present

## 2019-09-19 DIAGNOSIS — I4891 Unspecified atrial fibrillation: Secondary | ICD-10-CM | POA: Diagnosis not present

## 2019-09-19 DIAGNOSIS — Z9981 Dependence on supplemental oxygen: Secondary | ICD-10-CM | POA: Diagnosis not present

## 2019-09-19 DIAGNOSIS — I110 Hypertensive heart disease with heart failure: Secondary | ICD-10-CM | POA: Diagnosis present

## 2019-09-19 DIAGNOSIS — Z7901 Long term (current) use of anticoagulants: Secondary | ICD-10-CM

## 2019-09-19 DIAGNOSIS — E039 Hypothyroidism, unspecified: Secondary | ICD-10-CM | POA: Diagnosis present

## 2019-09-19 DIAGNOSIS — Z7984 Long term (current) use of oral hypoglycemic drugs: Secondary | ICD-10-CM

## 2019-09-19 DIAGNOSIS — Z888 Allergy status to other drugs, medicaments and biological substances status: Secondary | ICD-10-CM | POA: Diagnosis not present

## 2019-09-19 DIAGNOSIS — Z66 Do not resuscitate: Secondary | ICD-10-CM | POA: Diagnosis present

## 2019-09-19 DIAGNOSIS — Z79899 Other long term (current) drug therapy: Secondary | ICD-10-CM | POA: Diagnosis not present

## 2019-09-19 DIAGNOSIS — J441 Chronic obstructive pulmonary disease with (acute) exacerbation: Principal | ICD-10-CM | POA: Diagnosis present

## 2019-09-19 DIAGNOSIS — E785 Hyperlipidemia, unspecified: Secondary | ICD-10-CM | POA: Diagnosis present

## 2019-09-19 DIAGNOSIS — F329 Major depressive disorder, single episode, unspecified: Secondary | ICD-10-CM | POA: Diagnosis present

## 2019-09-19 DIAGNOSIS — E119 Type 2 diabetes mellitus without complications: Secondary | ICD-10-CM | POA: Diagnosis present

## 2019-09-19 DIAGNOSIS — K219 Gastro-esophageal reflux disease without esophagitis: Secondary | ICD-10-CM | POA: Diagnosis present

## 2019-09-19 DIAGNOSIS — I5032 Chronic diastolic (congestive) heart failure: Secondary | ICD-10-CM | POA: Diagnosis present

## 2019-09-19 DIAGNOSIS — I272 Pulmonary hypertension, unspecified: Secondary | ICD-10-CM | POA: Diagnosis present

## 2019-09-19 DIAGNOSIS — Z87891 Personal history of nicotine dependence: Secondary | ICD-10-CM | POA: Diagnosis not present

## 2019-09-19 LAB — CBC WITH DIFFERENTIAL/PLATELET
Abs Immature Granulocytes: 0.32 10*3/uL — ABNORMAL HIGH (ref 0.00–0.07)
Basophils Absolute: 0.1 10*3/uL (ref 0.0–0.1)
Basophils Relative: 1 %
Eosinophils Absolute: 0.1 10*3/uL (ref 0.0–0.5)
Eosinophils Relative: 1 %
HCT: 41.5 % (ref 36.0–46.0)
Hemoglobin: 13.3 g/dL (ref 12.0–15.0)
Immature Granulocytes: 2 %
Lymphocytes Relative: 15 %
Lymphs Abs: 2.2 10*3/uL (ref 0.7–4.0)
MCH: 31.6 pg (ref 26.0–34.0)
MCHC: 32 g/dL (ref 30.0–36.0)
MCV: 98.6 fL (ref 80.0–100.0)
Monocytes Absolute: 1.3 10*3/uL — ABNORMAL HIGH (ref 0.1–1.0)
Monocytes Relative: 9 %
Neutro Abs: 10.1 10*3/uL — ABNORMAL HIGH (ref 1.7–7.7)
Neutrophils Relative %: 72 %
Platelets: 297 10*3/uL (ref 150–400)
RBC: 4.21 MIL/uL (ref 3.87–5.11)
RDW: 15.4 % (ref 11.5–15.5)
WBC: 14.1 10*3/uL — ABNORMAL HIGH (ref 4.0–10.5)
nRBC: 0.2 % (ref 0.0–0.2)

## 2019-09-19 LAB — COMPREHENSIVE METABOLIC PANEL
ALT: 20 U/L (ref 0–44)
AST: 18 U/L (ref 15–41)
Albumin: 3.5 g/dL (ref 3.5–5.0)
Alkaline Phosphatase: 45 U/L (ref 38–126)
Anion gap: 9 (ref 5–15)
BUN: 20 mg/dL (ref 8–23)
CO2: 29 mmol/L (ref 22–32)
Calcium: 8.9 mg/dL (ref 8.9–10.3)
Chloride: 102 mmol/L (ref 98–111)
Creatinine, Ser: 0.95 mg/dL (ref 0.44–1.00)
GFR calc Af Amer: >60 mL/min (ref >60)
GFR calc non Af Amer: 59 mL/min — ABNORMAL LOW (ref >60)
Glucose, Bld: 112 mg/dL — ABNORMAL HIGH (ref 70–99)
Potassium: 4.3 mmol/L (ref 3.5–5.1)
Sodium: 140 mmol/L (ref 135–145)
Total Bilirubin: 1.8 mg/dL — ABNORMAL HIGH (ref 0.3–1.2)
Total Protein: 6.5 g/dL (ref 6.5–8.1)

## 2019-09-19 LAB — GLUCOSE, CAPILLARY
Glucose-Capillary: 167 mg/dL — ABNORMAL HIGH (ref 70–99)
Glucose-Capillary: 223 mg/dL — ABNORMAL HIGH (ref 70–99)

## 2019-09-19 LAB — BLOOD GAS, VENOUS
Acid-Base Excess: 8.5 mmol/L — ABNORMAL HIGH (ref 0.0–2.0)
Bicarbonate: 34.5 mmol/L — ABNORMAL HIGH (ref 20.0–28.0)
O2 Saturation: 74 %
Patient temperature: 37
pCO2, Ven: 52 mmHg (ref 44.0–60.0)
pH, Ven: 7.43 (ref 7.250–7.430)
pO2, Ven: 38 mmHg (ref 32.0–45.0)

## 2019-09-19 LAB — MAGNESIUM: Magnesium: 2.1 mg/dL (ref 1.7–2.4)

## 2019-09-19 LAB — TROPONIN I (HIGH SENSITIVITY)
Troponin I (High Sensitivity): 9 ng/L (ref <18)
Troponin I (High Sensitivity): 8 ng/L (ref <18)

## 2019-09-19 LAB — SARS CORONAVIRUS 2 BY RT PCR (HOSPITAL ORDER, PERFORMED IN ~~LOC~~ HOSPITAL LAB): SARS Coronavirus 2: NEGATIVE

## 2019-09-19 LAB — BRAIN NATRIURETIC PEPTIDE: B Natriuretic Peptide: 275.4 pg/mL — ABNORMAL HIGH (ref 0.0–100.0)

## 2019-09-19 LAB — LACTIC ACID, PLASMA: Lactic Acid, Venous: 1.6 mmol/L (ref 0.5–1.9)

## 2019-09-19 MED ORDER — FUROSEMIDE 40 MG PO TABS
40.0000 mg | ORAL_TABLET | ORAL | Status: DC
  Administered 2019-09-20 – 2019-09-21 (×2): 40 mg via ORAL
  Filled 2019-09-19 (×2): qty 1

## 2019-09-19 MED ORDER — CITALOPRAM HYDROBROMIDE 20 MG PO TABS
40.0000 mg | ORAL_TABLET | Freq: Every day | ORAL | Status: DC
  Administered 2019-09-19: 40 mg via ORAL
  Filled 2019-09-19: qty 2

## 2019-09-19 MED ORDER — METHYLPREDNISOLONE SODIUM SUCC 40 MG IJ SOLR
40.0000 mg | Freq: Two times a day (BID) | INTRAMUSCULAR | Status: DC
  Administered 2019-09-20 (×3): 40 mg via INTRAVENOUS
  Filled 2019-09-19 (×4): qty 1

## 2019-09-19 MED ORDER — LIDOCAINE-EPINEPHRINE 2 %-1:100000 IJ SOLN
20.0000 mL | Freq: Once | INTRAMUSCULAR | Status: AC
  Administered 2019-09-19: 20 mL
  Filled 2019-09-19: qty 1

## 2019-09-19 MED ORDER — POLYVINYL ALCOHOL 1.4 % OP SOLN
1.0000 [drp] | OPHTHALMIC | Status: DC | PRN
  Filled 2019-09-19: qty 15

## 2019-09-19 MED ORDER — IPRATROPIUM-ALBUTEROL 0.5-2.5 (3) MG/3ML IN SOLN
3.0000 mL | RESPIRATORY_TRACT | Status: DC
  Administered 2019-09-19 – 2019-09-20 (×4): 3 mL via RESPIRATORY_TRACT
  Filled 2019-09-19 (×5): qty 3

## 2019-09-19 MED ORDER — METHYLPREDNISOLONE SODIUM SUCC 125 MG IJ SOLR
125.0000 mg | Freq: Once | INTRAMUSCULAR | Status: AC
  Administered 2019-09-19: 125 mg via INTRAVENOUS
  Filled 2019-09-19: qty 2

## 2019-09-19 MED ORDER — DM-GUAIFENESIN ER 30-600 MG PO TB12: 1.0000 | ORAL_TABLET | Freq: Two times a day (BID) | ORAL | Status: DC | PRN

## 2019-09-19 MED ORDER — DOCUSATE SODIUM 100 MG PO CAPS
100.0000 mg | ORAL_CAPSULE | Freq: Every day | ORAL | Status: DC
  Administered 2019-09-19: 100 mg via ORAL
  Filled 2019-09-19: qty 1

## 2019-09-19 MED ORDER — DILTIAZEM HCL-DEXTROSE 125-5 MG/125ML-% IV SOLN (PREMIX)
5.0000 mg/h | INTRAVENOUS | Status: DC
  Administered 2019-09-19: 5 mg/h via INTRAVENOUS
  Filled 2019-09-19: qty 125

## 2019-09-19 MED ORDER — FAMOTIDINE 20 MG PO TABS
20.0000 mg | ORAL_TABLET | Freq: Every day | ORAL | Status: DC
  Administered 2019-09-19 – 2019-09-20 (×2): 20 mg via ORAL
  Filled 2019-09-19 (×3): qty 1

## 2019-09-19 MED ORDER — ACETAMINOPHEN 325 MG PO TABS: 650.0000 mg | ORAL_TABLET | Freq: Four times a day (QID) | ORAL | Status: DC | PRN

## 2019-09-19 MED ORDER — DILTIAZEM HCL ER COATED BEADS 300 MG PO CP24
300.0000 mg | ORAL_CAPSULE | Freq: Every day | ORAL | Status: DC
  Administered 2019-09-19 – 2019-09-20 (×2): 300 mg via ORAL
  Filled 2019-09-19 (×3): qty 1

## 2019-09-19 MED ORDER — BISOPROLOL FUMARATE 5 MG PO TABS
2.5000 mg | ORAL_TABLET | Freq: Two times a day (BID) | ORAL | Status: DC
  Administered 2019-09-19 – 2019-09-20 (×3): 2.5 mg via ORAL
  Filled 2019-09-19 (×5): qty 0.5

## 2019-09-19 MED ORDER — ALBUTEROL SULFATE (2.5 MG/3ML) 0.083% IN NEBU: 2.5000 mg | INHALATION_SOLUTION | RESPIRATORY_TRACT | Status: DC | PRN

## 2019-09-19 MED ORDER — DOXYCYCLINE HYCLATE 100 MG PO TABS
100.0000 mg | ORAL_TABLET | Freq: Two times a day (BID) | ORAL | Status: DC
  Administered 2019-09-19 – 2019-09-20 (×4): 100 mg via ORAL
  Filled 2019-09-19 (×5): qty 1

## 2019-09-19 MED ORDER — CALCIUM CARBONATE-VITAMIN D 500-200 MG-UNIT PO TABS
1.0000 | ORAL_TABLET | Freq: Two times a day (BID) | ORAL | Status: DC
  Administered 2019-09-20 (×2): 1 via ORAL
  Filled 2019-09-19 (×3): qty 1

## 2019-09-19 MED ORDER — INSULIN ASPART 100 UNIT/ML ~~LOC~~ SOLN
0.0000 [IU] | Freq: Three times a day (TID) | SUBCUTANEOUS | Status: DC
  Administered 2019-09-19 – 2019-09-20 (×2): 2 [IU] via SUBCUTANEOUS
  Administered 2019-09-20 (×2): 1 [IU] via SUBCUTANEOUS
  Administered 2019-09-21: 2 [IU] via SUBCUTANEOUS
  Filled 2019-09-19 (×5): qty 1

## 2019-09-19 MED ORDER — QUETIAPINE FUMARATE 25 MG PO TABS: 25.0000 mg | ORAL_TABLET | Freq: Every evening | ORAL | Status: DC | PRN

## 2019-09-19 MED ORDER — POLYETHYLENE GLYCOL 3350 17 G PO PACK: 17.0000 g | PACK | Freq: Every day | ORAL | Status: DC | PRN

## 2019-09-19 MED ORDER — LOPERAMIDE HCL 2 MG PO CAPS: 2.0000 mg | ORAL_CAPSULE | Freq: Four times a day (QID) | ORAL | Status: DC | PRN

## 2019-09-19 MED ORDER — LORAZEPAM 0.5 MG PO TABS
0.5000 mg | ORAL_TABLET | Freq: Two times a day (BID) | ORAL | Status: DC
  Administered 2019-09-19 – 2019-09-20 (×4): 0.5 mg via ORAL
  Filled 2019-09-19 (×5): qty 1

## 2019-09-19 MED ORDER — LEVOTHYROXINE SODIUM 50 MCG PO TABS
150.0000 ug | ORAL_TABLET | Freq: Every day | ORAL | Status: DC
  Administered 2019-09-20 – 2019-09-21 (×2): 150 ug via ORAL
  Filled 2019-09-19 (×2): qty 1

## 2019-09-19 MED ORDER — INSULIN ASPART 100 UNIT/ML ~~LOC~~ SOLN
0.0000 [IU] | Freq: Every day | SUBCUTANEOUS | Status: DC
  Administered 2019-09-19: 2 [IU] via SUBCUTANEOUS
  Filled 2019-09-19: qty 1

## 2019-09-19 MED ORDER — APIXABAN 5 MG PO TABS
5.0000 mg | ORAL_TABLET | Freq: Two times a day (BID) | ORAL | Status: DC
  Administered 2019-09-19 – 2019-09-20 (×3): 5 mg via ORAL
  Filled 2019-09-19 (×4): qty 1

## 2019-09-19 MED ORDER — MELATONIN 5 MG PO TABS
2.5000 mg | ORAL_TABLET | Freq: Every day | ORAL | Status: DC
  Administered 2019-09-20: 2.5 mg via ORAL
  Filled 2019-09-19: qty 1

## 2019-09-19 MED ORDER — ONDANSETRON HCL 4 MG/2ML IJ SOLN
4.0000 mg | Freq: Once | INTRAMUSCULAR | Status: AC
  Administered 2019-09-19: 4 mg via INTRAVENOUS
  Filled 2019-09-19: qty 2

## 2019-09-19 MED ORDER — BUSPIRONE HCL 15 MG PO TABS
15.0000 mg | ORAL_TABLET | Freq: Two times a day (BID) | ORAL | Status: DC
  Administered 2019-09-19 – 2019-09-20 (×4): 15 mg via ORAL
  Filled 2019-09-19: qty 1
  Filled 2019-09-19: qty 3
  Filled 2019-09-19 (×4): qty 1
  Filled 2019-09-19: qty 3

## 2019-09-19 MED ORDER — MOMETASONE FURO-FORMOTEROL FUM 200-5 MCG/ACT IN AERO
2.0000 | INHALATION_SPRAY | Freq: Two times a day (BID) | RESPIRATORY_TRACT | Status: DC
  Administered 2019-09-20 – 2019-09-21 (×3): 2 via RESPIRATORY_TRACT
  Filled 2019-09-19 (×2): qty 8.8

## 2019-09-19 MED ORDER — IPRATROPIUM-ALBUTEROL 0.5-2.5 (3) MG/3ML IN SOLN
9.0000 mL | Freq: Once | RESPIRATORY_TRACT | Status: AC
  Administered 2019-09-19: 9 mL via RESPIRATORY_TRACT
  Filled 2019-09-19: qty 9

## 2019-09-19 NOTE — ED Notes (Signed)
Repeat trop collected by this RN. Pt tolerated well. Admitting MD at bedside at this time.

## 2019-09-19 NOTE — ED Notes (Signed)
fsbs [id number]

## 2019-09-19 NOTE — ED Notes (Signed)
Contacted pt's daughter Jackelyn Poling per pt request. Updated on status

## 2019-09-19 NOTE — ED Notes (Signed)
meds given  Breathing treatment done.  Family with pt.  Pt alert   Iv in place infusing meds   afib at 104
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
## 2019-09-19 NOTE — ED Notes (Signed)
CBG 223 
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
## 2019-09-19 NOTE — ED Notes (Signed)
Pt eating dinner tray.  Family with pt.  Pt repositioned in bed.  Pure wick in place.

## 2019-09-19 NOTE — ED Notes (Signed)
Blood pressure improved  103/73  NP Randol Kern aware.

## 2019-09-19 NOTE — ED Notes (Signed)
2 skin tears noted to L posterior forearm dressed by this RN with Xeroform and non adhesive gauze, 2 skin tears noted to posterior R elbow, dressed by this RN with xeroform and non adhesive gauze. Pt with large skin tear to anterior R shin, non-adhesive gauze placed until EDP able to place stitches.

## 2019-09-19 NOTE — ED Notes (Signed)
Resumed care from [employer].  Pt alert  Iv meds infusing  afib on monitor.  Pure wick in place.  Pt on 3 liters oxygen  Family at bedside.  Pt waiting on admission.

## 2019-09-19 NOTE — ED Notes (Signed)
Pt presents to ED via [facility] with c/o SOB. Per EMS pt came from home with c/o several falls over the last several days. Per EMS pt with several skin tears to L lower leg and bilateral lower extremities. Per EMS pt on O2 chronic, however upon arrival pt not visualized on O2, 89% on RA. EMS reports pt reports she feels SOB and "just doesn't feel good".    A-fib RVR (highest HR 140), other VS WNL

## 2019-09-19 NOTE — ED Notes (Signed)
EDP at bedside for laceration repair

## 2019-09-19 NOTE — ED Notes (Signed)
cardizem drip stopped.  NP Randol Kern notified.  Pt repositioned.  Oxygen in place.

## 2019-09-19 NOTE — ED Notes (Signed)
Spoke with [facility] RN and updated on pt's status and plan of care

## 2019-09-19 NOTE — ED Notes (Signed)
Report called to [employer]

## 2019-09-20 DIAGNOSIS — I482 Chronic atrial fibrillation, unspecified: Secondary | ICD-10-CM

## 2019-09-20 LAB — CBC
HCT: 39.2 % (ref 36.0–46.0)
Hemoglobin: 13.3 g/dL (ref 12.0–15.0)
MCH: 32.1 pg (ref 26.0–34.0)
MCHC: 33.9 g/dL (ref 30.0–36.0)
MCV: 94.7 fL (ref 80.0–100.0)
Platelets: 311 10*3/uL (ref 150–400)
RBC: 4.14 MIL/uL (ref 3.87–5.11)
RDW: 15.2 % (ref 11.5–15.5)
WBC: 15.2 10*3/uL — ABNORMAL HIGH (ref 4.0–10.5)
nRBC: 0 % (ref 0.0–0.2)

## 2019-09-20 LAB — GLUCOSE, CAPILLARY
Glucose-Capillary: 124 mg/dL — ABNORMAL HIGH (ref 70–99)
Glucose-Capillary: 128 mg/dL — ABNORMAL HIGH (ref 70–99)
Glucose-Capillary: 155 mg/dL — ABNORMAL HIGH (ref 70–99)
Glucose-Capillary: 162 mg/dL — ABNORMAL HIGH (ref 70–99)

## 2019-09-20 LAB — TSH: TSH: 2.177 u[IU]/mL (ref 0.350–4.500)

## 2019-09-20 MED ORDER — DOCUSATE SODIUM 100 MG PO CAPS
100.0000 mg | ORAL_CAPSULE | Freq: Two times a day (BID) | ORAL | Status: DC
  Administered 2019-09-20 (×2): 100 mg via ORAL
  Filled 2019-09-20 (×3): qty 1

## 2019-09-20 MED ORDER — SODIUM CHLORIDE 0.9% FLUSH
3.0000 mL | Freq: Two times a day (BID) | INTRAVENOUS | Status: DC
  Administered 2019-09-20 (×3): 3 mL via INTRAVENOUS

## 2019-09-20 MED ORDER — CITALOPRAM HYDROBROMIDE 20 MG PO TABS
20.0000 mg | ORAL_TABLET | Freq: Every day | ORAL | Status: DC
  Administered 2019-09-20: 20 mg via ORAL
  Filled 2019-09-20: qty 1

## 2019-09-20 MED ORDER — ALBUTEROL SULFATE (2.5 MG/3ML) 0.083% IN NEBU
2.5000 mg | INHALATION_SOLUTION | Freq: Four times a day (QID) | RESPIRATORY_TRACT | Status: DC
  Administered 2019-09-20 – 2019-09-21 (×3): 2.5 mg via RESPIRATORY_TRACT
  Filled 2019-09-20 (×3): qty 3

## 2019-09-20 MED ORDER — TIOTROPIUM BROMIDE MONOHYDRATE 18 MCG IN CAPS
18.0000 ug | ORAL_CAPSULE | Freq: Every day | RESPIRATORY_TRACT | Status: DC
  Administered 2019-09-20 – 2019-09-21 (×2): 18 ug via RESPIRATORY_TRACT
  Filled 2019-09-20: qty 5

## 2019-09-20 NOTE — Progress Notes (Signed)
In/Out cath ordered, patient refused. Sharion Settler, NP notified

## 2019-09-20 NOTE — TOC Initial Note (Addendum)
Transition of Care [facility]) - Initial/Assessment Note    Patient Details  Name: Summer Bradley MRN: [id number] Date of Birth: 1945-03-31  Transition [facility] Care [facility] CM/SW Contact:    Beverly Sessions, RN Phone Number: 09/20/2019, 12:14 PM  Clinical Narrative:                 Patient admitted from home with COPD Patient lives at home alone in an apartment.  Children do live locally  TOC spoke with Dr Lutricia Feil at [facility] [phone]  Patient does to the center 4-5 days a week, and has an aide that comes out twice a day to the home.   Patient ambulated with 3 wheel walker.    Per dr Lutricia Feil plan is for patient to return home with continuation of PACE services.  They will be able to provide transport if during business hours.    MD notified that PACE MD requesting call   Expected Discharge Plan: [place] (PACE) Barriers to Discharge: No Barriers Identified   Patient Goals and CMS Choice        Expected Discharge Plan and Services Expected Discharge Plan: [place] (PACE)   Discharge Planning Services: CM Consult   Living arrangements for the past 2 months: Apartment                                      Prior Living Arrangements/Services Living arrangements for the past 2 months: Apartment Lives with:: Self Patient language and need for interpreter reviewed:: Yes Do you feel safe going back to the place where you live?: Yes      Need for Family Participation in Patient Care: Yes (Comment) Care giver support system in place?: Yes (comment) Current home services: DME (PACE)    Activities of Daily Living Home Assistive Devices/Equipment: Oxygen ADL Screening (condition at time of admission) Patient's cognitive ability adequate to safely complete daily activities?: Yes Is the patient deaf or have difficulty hearing?: No Does the patient have difficulty seeing, even when wearing glasses/contacts?: No Does the patient have  difficulty concentrating, remembering, or making decisions?: No Patient able to express need for assistance with ADLs?: Yes Does the patient have difficulty dressing or bathing?: Yes Independently performs ADLs?: No Communication: Independent Dressing (OT): Needs assistance Is this a change from baseline?: Pre-admission baseline Grooming: Independent Feeding: Independent Bathing: Needs assistance Is this a change from baseline?: Pre-admission baseline Toileting: Needs assistance Is this a change from baseline?: Pre-admission baseline In/Out Bed: Needs assistance Is this a change from baseline?: Pre-admission baseline Walks in Home: Dependent Is this a change from baseline?: Pre-admission baseline Does the patient have difficulty walking or climbing stairs?: Yes Weakness of Legs: Both Weakness of Arms/Hands: Both  Permission Sought/Granted                  Emotional Assessment Appearance:: Appears stated age     Orientation: : Oriented to Self, Oriented to Place, Oriented to  Time   Psych Involvement: No (comment)  Admission diagnosis:  COPD exacerbation ([place]) [J44.1] Fall, initial encounter [W19.XXXA] Laceration of left knee, initial encounter [S81.012A] Patient Active Problem List   Diagnosis Date Noted  . Diabetes mellitus without complication ([place]) 30/10/2328  . GERD (gastroesophageal reflux disease) 09/19/2019  . Fall 09/19/2019  . COPD exacerbation ([place]) 08/14/2019  . Acute on chronic respiratory failure with hypoxia ([place]) 08/13/2019  .  Hypothyroidism 08/13/2019  . History of CVA (cerebrovascular accident) 08/13/2019  . Palliative care by specialist   . Goals of care, counseling/discussion   . Acute delirium   . Atrial fibrillation with rapid ventricular response ([place])   . Anxiety and depression   . Chronic heart failure with preserved ejection fraction (HFpEF) ([place])   . Pulmonary hypertension, unspecified ([place])   . Acute on chronic respiratory failure  with hypoxemia ([place]) 02/03/2018  . Pulmonary emphysema ([place]) 04/25/2015  . Emphysema of lung ([place]) 05/09/2014  . 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[place]) 05/09/2014  . Hyperlipidemia 05/09/2014  . GI bleed   . Benign neoplasm of colon 03/03/2014  . Diverticulosis of colon without hemorrhage 03/03/2014  . 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 Jejunal ulcer 03/03/2014  . Gastric polyps 03/03/2014  . Acute posthemorrhagic anemia 03/02/2014  . Melena 02/27/2014  . LGI bleed   . Acute on chronic respiratory failure ([place]) 02/16/2014  . 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 Aspiration pneumonitis ([place])   . Spontaneous pneumothorax   . Thrush   . COPD with acute exacerbation ([place])   . SOB (shortness of breath)   . HLD (hyperlipidemia)   . Essential hypertension   . Dysphagia, pharyngoesophageal phase   . Acute tracheobronchitis 02/04/2014  . Atelectasis of left lung 02/04/2014  . Wheezing   . Acute respiratory failure ([place])   . Paroxysmal A-fib ([place])   . Cerebral infarction due to occlusion or stenosis of precerebral artery ([place])   . 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[place])   . Stroke ([place]) 01/29/2014  . Respiratory failure ([place]) 01/29/2014   PCP:  [facility]:   [place], [place] [address] Phone: [phone] Fax: [phone]  [place] [address] [place] [address] Phone: [phone] Fax: [place], [place] [address] Phone: [phone] Fax: [phone]     Social Determinants of Health (SDOH) Interventions    Readmission Risk Interventions Readmission Risk Prevention Plan 09/20/2019   Transportation Screening Complete  PCP or Specialist Appt within 3-5 Days Complete  HRI or Home Care Consult Complete  Palliative Care Screening Not Applicable  Medication Review (RN Care Manager) Complete  Some recent data might be hidden

## 2019-09-20 NOTE — Progress Notes (Addendum)
Patient ID: Summer Bradley, female   DOB: [birth year], 74 y.o.   MRN: [id number] [facility] PROGRESS NOTE  Mikhala Kenan UMP:[id number] DOB: [birth year] DOA: 09/19/2019 PCP: [place]  HPI/Subjective: Patient complains that she cannot breathe.  She was getting a nebulizer this morning at the time.  She was asking for her inhalers.  I made sure her inhalers were ordered for her.  Patient does have some cough and some wheeze.  Objective: Vitals:   09/20/19 1140 09/20/19 1146  BP: 120/82 120/82  Pulse:  91  Resp:  (!) 22  Temp:  98.5 F (36.9 C)  SpO2:  98%    Intake/Output Summary (Last 24 hours) at 09/20/2019 1220 Last data filed at 09/20/2019 0400 Gross per 24 hour  Intake 76.31 ml  Output --  Net 76.31 ml   Filed Weights   09/19/19 0829  Weight: 85.5 kg    ROS: Review of Systems  Constitutional: Negative for malaise/fatigue.  Respiratory: Positive for cough, shortness of breath and wheezing.   Gastrointestinal: Negative for abdominal pain, nausea and vomiting.   Exam: Physical Exam HENT:     Nose: No mucosal edema.     Mouth/Throat:     Pharynx: No oropharyngeal exudate.  Eyes:     General: Lids are normal.     Conjunctiva/sclera: Conjunctivae normal.     Pupils: Pupils are equal, round, and reactive to light.  Cardiovascular:     Rate and Rhythm: Normal rate. Rhythm irregularly irregular.     Heart sounds: Normal heart sounds, S1 normal and S2 normal.  Pulmonary:     Breath sounds: Examination of the right-middle field reveals decreased breath sounds and wheezing. Examination of the left-middle field reveals decreased breath sounds and wheezing. Examination of the right-lower field reveals decreased breath sounds and rhonchi. Examination of the left-lower field reveals decreased breath sounds and rhonchi. Decreased breath sounds, wheezing and rhonchi present. No rales.  Abdominal:     Palpations: Abdomen is soft.     Tenderness:  There is no abdominal tenderness.  Musculoskeletal:     Right ankle: Swelling present.     Left ankle: Swelling present.  Skin:    General: Skin is warm.     Findings: No rash.  Neurological:     Mental Status: She is alert and oriented to person, place, and time.       Data Reviewed: Basic Metabolic Panel: Recent Labs  Lab 09/19/19 0823  NA 140  K 4.3  CL 102  CO2 29  GLUCOSE 112*  BUN 20  CREATININE 0.95  CALCIUM 8.9  MG 2.1   Liver Function Tests: Recent Labs  Lab 09/19/19 0823  AST 18  ALT 20  ALKPHOS 45  BILITOT 1.8*  PROT 6.5  ALBUMIN 3.5   CBC: Recent Labs  Lab 09/19/19 0823 09/20/19 0440  WBC 14.1* 15.2*  NEUTROABS 10.1*  --   HGB 13.3 13.3  HCT 41.5 39.2  MCV 98.6 94.7  PLT 297 311   BNP (last 3 results) Recent Labs    04/28/19 1015 08/13/19 2208 09/19/19 0823  BNP 378.0* 300.8* 275.4*    CBG: Recent Labs  Lab 09/19/19 1807 09/19/19 2155 09/20/19 0752 09/20/19 1146  GLUCAP 167* 223* 124* 128*    Recent Results (from the past 240 hour(s))  SARS Coronavirus 2 by RT PCR (hospital order, performed in [facility] hospital lab) Nasopharyngeal Nasopharyngeal Swab     Status: None   Collection Time: 09/19/19  8:23 AM   Specimen: Nasopharyngeal Swab  Result Value Ref Range Status   SARS Coronavirus 2 NEGATIVE NEGATIVE Final    Comment: (NOTE) SARS-CoV-2 target nucleic acids are NOT DETECTED.  The SARS-CoV-2 RNA is generally detectable in upper and lower respiratory specimens during the acute phase of infection. The lowest concentration of SARS-CoV-2 viral copies this assay can detect is 250 copies / mL. A negative result does not preclude SARS-CoV-2 infection and should not be used as the sole basis for treatment or other patient management decisions.  A negative result may occur with improper specimen collection / handling, submission of specimen other than nasopharyngeal swab, presence of viral mutation(s) within the areas  targeted by this assay, and inadequate number of viral copies (<250 copies / mL). A negative result must be combined with clinical observations, patient history, and epidemiological information.  Fact Sheet for Patients:   StrictlyIdeas.no  Fact Sheet for Healthcare Providers: BankingDealers.co.za  This test is not yet approved or  cleared by the [place] FDA and has been authorized for detection and/or diagnosis of SARS-CoV-2 by FDA under an Emergency Use Authorization (EUA).  This EUA will remain in effect (meaning this test can be used) for the duration of the COVID-19 declaration under Section 564(b)(1) of the Act, 21 U.S.C. section 360bbb-3(b)(1), unless the authorization is terminated or revoked sooner.  Performed at [facility], [address] [place], [place] [address]   Culture, blood (routine x 2)     Status: None (Preliminary result)   Collection Time: 09/19/19  8:23 AM   Specimen: BLOOD  Result Value Ref Range Status   Specimen Description BLOOD RIGHT FA  Final   Special Requests   Final    BOTTLES DRAWN AEROBIC AND ANAEROBIC Blood Culture adequate volume   Culture   Final    NO GROWTH < 24 HOURS Performed at [facility], [address], [place], [place] [address]    Report Status PENDING  Incomplete  Culture, blood (routine x 2)     Status: None (Preliminary result)   Collection Time: 09/19/19  8:23 AM   Specimen: BLOOD  Result Value Ref Range Status   Specimen Description BLOOD RIGHT HAND  Final   Special Requests   Final    BOTTLES DRAWN AEROBIC AND ANAEROBIC Blood Culture adequate volume   Culture   Final    NO GROWTH < 24 HOURS Performed at [facility], [address], [place], [place] [address]    Report Status PENDING  Incomplete     Studies: DG Knee 2 Views Left  Result Date: 09/19/2019 CLINICAL DATA:  Left knee injury after fall. EXAM: LEFT KNEE - 1-2 VIEW  COMPARISON:  None. FINDINGS: No evidence of fracture, dislocation, or joint effusion. Severe narrowing of medial joint space is noted. Soft tissue laceration is seen anterior to the proximal tibia without radiopaque foreign body. IMPRESSION: Severe degenerative joint disease is noted medially. No fracture or dislocation is noted. Electronically Signed   By: Marijo Conception M.D.   On: 09/19/2019 08:54   CT Head Wo Contrast  Result Date: 09/19/2019 CLINICAL DATA:  Fall last night.  History of stroke. EXAM: CT HEAD WITHOUT CONTRAST CT CERVICAL SPINE WITHOUT CONTRAST TECHNIQUE: Multidetector CT imaging of the head and cervical spine was performed following the standard protocol without intravenous contrast. Multiplanar CT image reconstructions of the cervical spine were also generated. COMPARISON:  CT head 04/28/2019 FINDINGS: CT HEAD FINDINGS Brain: Generalized atrophy. Large  territory chronic right MCA infarct is unchanged. Scattered hypodensities in the cerebral white matter bilaterally. Chronic infarcts in the cerebellum bilaterally. Vascular: Negative for hyperdense vessel Skull: Negative Sinuses/Orbits: Paranasal sinuses clear. Bilateral cataract extraction. No orbital lesion. Other: None CT CERVICAL SPINE FINDINGS Alignment: Normal Skull base and vertebrae: Negative for fracture Soft tissues and spinal canal: Negative for mass or soft tissue swelling. Atherosclerotic calcification in the carotid artery bilaterally. Disc levels: Multilevel disc and facet degeneration with spurring. Spinal and foraminal stenosis due to spurring throughout the cervical spine. Upper chest: Lung apices clear bilaterally. Other: None IMPRESSION: 1. No acute intracranial abnormality. Chronic right MCA infarct as well as scattered infarcts in the white matter and cerebellum bilaterally. 2. Cervical spondylosis.  Negative for cervical fracture. Electronically Signed   By: Franchot Gallo M.D.   On: 09/19/2019 08:49   CT Cervical Spine  Wo Contrast  Result Date: 09/19/2019 CLINICAL DATA:  Fall last night.  History of stroke. EXAM: CT HEAD WITHOUT CONTRAST CT CERVICAL SPINE WITHOUT CONTRAST TECHNIQUE: Multidetector CT imaging of the head and cervical spine was performed following the standard protocol without intravenous contrast. Multiplanar CT image reconstructions of the cervical spine were also generated. COMPARISON:  CT head 04/28/2019 FINDINGS: CT HEAD FINDINGS Brain: Generalized atrophy. Large territory chronic right MCA infarct is unchanged. Scattered hypodensities in the cerebral white matter bilaterally. Chronic infarcts in the cerebellum bilaterally. Vascular: Negative for hyperdense vessel Skull: Negative Sinuses/Orbits: Paranasal sinuses clear. Bilateral cataract extraction. No orbital lesion. Other: None CT CERVICAL SPINE FINDINGS Alignment: Normal Skull base and vertebrae: Negative for fracture Soft tissues and spinal canal: Negative for mass or soft tissue swelling. Atherosclerotic calcification in the carotid artery bilaterally. Disc levels: Multilevel disc and facet degeneration with spurring. Spinal and foraminal stenosis due to spurring throughout the cervical spine. Upper chest: Lung apices clear bilaterally. Other: None IMPRESSION: 1. No acute intracranial abnormality. Chronic right MCA infarct as well as scattered infarcts in the white matter and cerebellum bilaterally. 2. Cervical spondylosis.  Negative for cervical fracture. Electronically Signed   By: Franchot Gallo M.D.   On: 09/19/2019 08:49   DG Chest Portable 1 View  Result Date: 09/19/2019 CLINICAL DATA:  Shortness of breath. EXAM: PORTABLE CHEST 1 VIEW COMPARISON:  August 13, 2019. FINDINGS: Stable cardiomegaly. No pneumothorax or pleural effusion is noted. Both lungs are clear. The visualized skeletal structures are unremarkable. IMPRESSION: No active disease. Electronically Signed   By: Marijo Conception M.D.   On: 09/19/2019 08:55    Scheduled Meds: . albuterol   2.5 mg Nebulization Q6H  . apixaban  5 mg Oral BID  . bisoprolol  2.5 mg Oral BID  . busPIRone  15 mg Oral BID  . calcium-vitamin D  1 tablet Oral BID  . citalopram  40 mg Oral QHS  . diltiazem  300 mg Oral Daily  . docusate sodium  100 mg Oral BID  . doxycycline  100 mg Oral Q12H  . famotidine  20 mg Oral Daily  . furosemide  40 mg Oral BH-q7a  . insulin aspart  0-5 Units Subcutaneous QHS  . insulin aspart  0-9 Units Subcutaneous TID WC  . levothyroxine  150 mcg Oral QAC breakfast  . LORazepam  0.5 mg Oral BID  . melatonin  2.5 mg Oral QHS  . methylPREDNISolone (SOLU-MEDROL) injection  40 mg Intravenous Q12H  . mometasone-formoterol  2 puff Inhalation BID  . sodium chloride flush  3 mL Intravenous Q12H  . tiotropium  18 mcg Inhalation Daily   Continuous Infusions:  Assessment/Plan:  1. Acute on chronic hypoxic respiratory failure.  Continue oxygen supplementation 2. COPD exacerbation continue Solu-Medrol 40 mg IV twice daily.  Continue doxycycline.  Continue nebulizers and inhalers. 3. Chronic atrial fibrillation with history of stroke on Eliquis for anticoagulation.  Continue Cardizem and bisoprolol. 4. Hypothyroidism unspecified on levothyroxine 5. Anxiety on BuSpar and Ativan 6.  Weakness.  Appreciate physical therapy evaluation.  Only walked 3 steps.  Lives alone but does have pace program following. 7.  Laceration left knee.  This was covered.  No sign of infection.  Likely from fall.  Case discussed with Dr. Lutricia Feil pace physician she believes that anxiety is playing a major role in her shortness of breath.     Code Status:     Code Status Orders  (From admission, onward)         Start     Ordered   09/19/19 1540  Do not attempt resuscitation (DNR)  Continuous       Question Answer Comment  In the event of cardiac or respiratory ARREST Do not call a "code blue"   In the event of cardiac or respiratory ARREST Do not perform Intubation, CPR, defibrillation or  ACLS   In the event of cardiac or respiratory ARREST Use medication by any route, position, wound care, and other measures to relive pain and suffering. May use oxygen, suction and manual treatment of airway obstruction as needed for comfort.      09/19/19 1539        Code Status History    Date Active Date Inactive Code Status Order ID Comments User Context   08/14/2019 0007 08/17/2019 1856 DNR [id number]  Lenore Cordia, MD ED   04/28/2019 1516 04/29/2019 1937 DNR [id number]  Lorella Nimrod, MD ED   04/28/2019 1516 04/28/2019 1516 DNR [id number]  Lorella Nimrod, MD ED   04/28/2019 1515 04/28/2019 1516 Full Code [id number]  Lorella Nimrod, MD ED   12/28/2018 1934 01/03/2019 1857 DNR [id number]  Awilda Bill, NP Inpatient   12/23/2018 2233 12/28/2018 1934 Full Code [id number]  Mansy, Arvella Merles, MD ED   02/03/2018 0934 02/10/2018 1954 Full Code [id number]  Arta Silence, MD Inpatient   02/02/2014 0933 03/05/2014 1624 Full Code [id number]  Wilhelmina Mcardle, MD Inpatient   02/01/2014 1236 02/02/2014 0933 Full Code [id number]  Rob Hickman, MD Inpatient   01/29/2014 1119 02/01/2014 1236 Full Code [id number]  Roland Rack, MD Inpatient   Advance Care Planning Activity     Family Communication: Spoke with daughter on the phone Disposition Plan: Status is: Inpatient  Dispo: The patient is from: Home              Anticipated d/c is to: Home              Anticipated d/c date is: Potential 09/21/2019 versus 09/22/2019.              Patient currently being treated for COPD exacerbation with IV Solu-Medrol and nebulizer treatments.  Will reassess on a daily basis on when stable to go home.  Antibiotics:  Doxycycline  Time spent: 28 minutes  [place]

## 2019-09-20 NOTE — Plan of Care (Signed)
  Problem: Activity: Goal: Risk for activity intolerance will decrease Outcome: Progressing   Problem: Elimination: Goal: Will not experience complications related to bowel motility Outcome: Progressing   Problem: Safety: Goal: Ability to remain free from injury will improve Outcome: Progressing   Problem: Education: Goal: Knowledge of disease or condition will improve Outcome: Progressing   Problem: Activity: Goal: Ability to tolerate increased activity will improve Outcome: Progressing   Problem: Cardiac: Goal: Ability to achieve and maintain adequate cardiopulmonary perfusion will improve Outcome: Progressing

## 2019-09-20 NOTE — Evaluation (Signed)
Physical Therapy Evaluation Patient Details Name: Summer Bradley MRN: [id number] DOB: 1945/03/24 Today's Date: 09/20/2019   History of Present Illness  Summer Bradley is a 74 y.o. female with medical history significant of COPD on 3 L oxygen, hypertension, hyperlipidemia, diabetes mellitus, stroke, GERD, hypothyroidism, depression with anxiety, vertigo, atrial fibrillation on Eliquis, GI bleeding, who presents with shortness of breath.  Clinical Impression  Patient received in side lying. Reports she is having trouble breathing. Agreeable to PT assessment. Patient performed bed mobility independently. Requires min guard for sit to stand and to ambulate 3 feet to recliner. She is limited by sob. She will continue to benefit from skilled PT while here to improve safety with mobility and to improve activity tolerance.        Follow Up Recommendations Home health PT;Supervision for mobility/OOB;Supervision - Intermittent    Equipment Recommendations  None recommended by PT    Recommendations for Other Services       Precautions / Restrictions Precautions Precautions: Fall Restrictions Weight Bearing Restrictions: No      Mobility  Bed Mobility Overal bed mobility: Modified Independent             General bed mobility comments: increased effort. Patient did not require assist.  Transfers Overall transfer level: Needs assistance Equipment used: None Transfers: Sit to/from Stand Sit to Stand: Min guard            Ambulation/Gait Ambulation/Gait assistance: Min guard Gait Distance (Feet): 3 Feet Assistive device: None Gait Pattern/deviations: Step-through pattern Gait velocity: decr   General Gait Details: patient did well getting to chair, limited by sob  Stairs            Wheelchair Mobility    Modified Rankin (Stroke Patients Only)       Balance Overall balance assessment: Needs assistance;History of Falls Sitting-balance support: Feet  supported Sitting balance-Leahy Scale: Good     Standing balance support: No upper extremity supported;During functional activity Standing balance-Leahy Scale: Fair Standing balance comment: benefits from supervision to min guard for safety walking a short distance.                             Pertinent Vitals/Pain Pain Assessment: No/denies pain    Home Living Family/patient expects to be discharged to:: Private residence Living Arrangements: Children Available Help at Discharge: Family;Other (Comment) ([facility]) Type of Home: Apartment Home Access: Elevator     Home Layout: One level Home Equipment: Other (comment) (3 ww)      Prior Function Level of Independence: Independent with assistive device(s)         Comments: history of falls, fell yesterday     Hand Dominance   Dominant Hand: Right    Extremity/Trunk Assessment   Upper Extremity Assessment Upper Extremity Assessment: Generalized weakness    Lower Extremity Assessment Lower Extremity Assessment: Generalized weakness    Cervical / Trunk Assessment Cervical / Trunk Assessment: Normal  Communication   Communication: No difficulties  Cognition Arousal/Alertness: Awake/alert Behavior During Therapy: Anxious;Agitated Overall Cognitive Status: No family/caregiver present to determine baseline cognitive functioning                                 General Comments: I left room to get linens for chair set up and came back to patient yelling, accusing me of talking about her in  the hallway. Patient very agitated. Attempted to settle her. RN notified      General Comments      Exercises     Assessment/Plan    PT Assessment Patient needs continued PT services  PT Problem List Decreased strength;Decreased mobility;Decreased safety awareness;Decreased activity tolerance;Decreased balance;Cardiopulmonary status limiting activity       PT Treatment  Interventions DME instruction;Therapeutic activities;Gait training;Therapeutic exercise;Patient/family education;Stair training;Balance training;Functional mobility training;Neuromuscular re-education    PT Goals (Current goals can be found in the Care Plan section)  Acute Rehab PT Goals Patient Stated Goal: to breath better PT Goal Formulation: With patient Time For Goal Achievement: 10/04/19 Potential to Achieve Goals: Good    Frequency Min 2X/week   Barriers to discharge        Co-evaluation               AM-PAC PT "6 Clicks" Mobility  Outcome Measure Help needed turning from your back to your side while in a flat bed without using bedrails?: None Help needed moving from lying on your back to sitting on the side of a flat bed without using bedrails?: None Help needed moving to and from a bed to a chair (including a wheelchair)?: A Little Help needed standing up from a chair using your arms (e.g., wheelchair or bedside chair)?: A Little Help needed to walk in hospital room?: A Little Help needed climbing 3-5 steps with a railing? : A Lot 6 Click Score: 19    End of Session Equipment Utilized During Treatment: Oxygen Activity Tolerance: Patient limited by fatigue;Other (comment) (SOB) Patient left: in chair;with call bell/phone within reach;with chair alarm set;with nursing/sitter in room Nurse Communication: Mobility status PT Visit Diagnosis: Other abnormalities of gait and mobility (R26.89);Muscle weakness (generalized) (M62.81);Difficulty in walking, not elsewhere classified (R26.2);History of falling (Z91.81)    Time: 4403-4742 PT Time Calculation (min) (ACUTE ONLY): 18 min   Charges:   PT Evaluation $PT Eval Moderate Complexity: 1 Mod PT Treatments $Gait Training: 8-22 mins        Dhanush Jokerst, PT, GCS 09/20/19,10:20 AM

## 2019-09-20 NOTE — Consult Note (Signed)
[facility] Nurse wound consult note Consultation was completed by review of records, images and assistance from the bedside nurse/clinical staff.  Reason for Consult: skin tears and laceration Patient sustained multiple falls at home per ED notes Wound type: Left posterior forearm x 2 skin tears/trauma Right posterior elbow x 2 skin tears/trauma Left knee laceration  Pressure Injury POA: NA Measurement: see nursing flow sheets Wound KKD:[id number] as clean and pink; laceration 7cm and repaired with sutures in the ED Drainage (amount, consistency, odor) serosanguinous   Dressing procedure/placement/frequency: Utilize skin care order set for skin tears and left knee laceration.  Single layer of xeroform as non adherent dressing, top with dry dressing; secure with kerlix or conform; avoid tape on the patient.  change every 3 days beginning 09/21/19; applied in the ED at the time of arrival. [facility] nurse for acute changes.  Discussed POC with bedside nurse.  Re consult if needed, will not follow at this time. Thanks  Lalanya Rufener [employer], RN,CWOCN, CNS, [place] [phone])

## 2019-09-20 NOTE — Progress Notes (Signed)
Pt up to floor. VS taken, Pt  A&O, placed on monitor. Skin assessed

## 2019-09-20 NOTE — Progress Notes (Signed)
Pt to move off of floor to room 225.Report called

## 2019-09-20 NOTE — Evaluation (Signed)
Occupational Therapy Evaluation Patient Details Name: Summer Bradley MRN: [id number] DOB: [birth year] Today's Date: 09/20/2019    History of Present Illness Summer Bradley is a 74 y.o. female with medical history significant of COPD on 3 L oxygen, hypertension, hyperlipidemia, diabetes mellitus, stroke, GERD, hypothyroidism, depression with anxiety, vertigo, atrial fibrillation on Eliquis, GI bleeding, who presents with shortness of breath.   Clinical Impression   Summer Bradley was seen for OT evaluation this date. Pt was independent in all ADL and functional mobility, living alone in a 1 level apartment home with a level entry. Pt on 3 liters of O2 at home. Pt reports becoming easily fatigued or out of breath with minimal exertion, and this has gotten consistently worse over the last 2 years. Pt [facility] in the [facility], and endorses working to stay as active and strong as possible, despite being limited by her need for supplemental O2. Pt currently requires CGA for safety during functional mobility, and MIN-MOD A for exertional ADL tasks including LB bathing and dressing due to current functional impairments (See OT Problem List below). Pt educated in energy conservation strategies including pursed lip breathing, activity pacing, safe use of AE/DME, and falls prevention. Pt would benefit from further review, and needs ECS handout from OT. Pt verbalized understanding and would benefit from additional skilled OT services to maximize recall and carryover of learned techniques and facilitate implementation of learned techniques into daily routines. Upon discharge, recommend [place] services.       Follow Up Recommendations  Home health OT;Supervision - Intermittent    Equipment Recommendations  None recommended by OT (Pt has necessary equipment.)    Recommendations for Other Services       Precautions / Restrictions Precautions Precautions: Fall Restrictions Weight Bearing  Restrictions: No      Mobility Bed Mobility Overal bed mobility: Modified Independent             General bed mobility comments: Pt in recliner at start/end of session. Per PT notes, pt is MOD I for bed mobility this date.  Transfers Overall transfer level: Needs assistance Equipment used: None Transfers: Sit to/from Stand Sit to Stand: Min guard              Balance Overall balance assessment: Needs assistance;History of Falls Sitting-balance support: Feet supported;No upper extremity supported Sitting balance-Leahy Scale: Good Sitting balance - Comments: Steady static sitting, reaching within BOS.   Standing balance support: No upper extremity supported;During functional activity Standing balance-Leahy Scale: Fair Standing balance comment: benefits from supervision to min guard for safety walking a short distance.                           ADL either performed or assessed with clinical judgement   ADL Overall ADL's : Needs assistance/impaired                                       General ADL Comments: Pt functionally limited by generalized weakness, cardiopulmonary status, and decreased cognition. She requires set-up assist for seated oral care given consistent cueing for sequencing. Anticipate MIN-MOD A for more exertional ADL management including LB bathing and dressing in a seated position.     Vision Baseline Vision/History: No visual deficits Patient Visual Report: No change from baseline       Perception     Praxis  Pertinent Vitals/Pain Pain Assessment: 0-10 Pain Score: 2  Pain Location: RUE Pain Descriptors / Indicators: Burning Pain Intervention(s): Limited activity within patient's tolerance;Monitored during session     Hand Dominance Right   Extremity/Trunk Assessment Upper Extremity Assessment Upper Extremity Assessment: Generalized weakness (LUE notably swollen as compared to R with decreased [facility].)    Lower Extremity Assessment Lower Extremity Assessment: Generalized weakness   Cervical / Trunk Assessment Cervical / Trunk Assessment: Normal   Communication Communication Communication: No difficulties   Cognition Arousal/Alertness: Awake/alert Behavior During Therapy: Anxious;Agitated Overall Cognitive Status: No family/caregiver present to determine baseline cognitive functioning                                 General Comments: Upon arrival to pt room, pt mildly agitated stating "I know you heard me out there", pt is also observed to have difficulty sequencing complex motor tasks like opening toothbrush package and applying toothpaste. She requires incereased cueing to follow VCs t/o session. No family/caregiver present to determine baseline.   General Comments  Pt remains on 3L t/o session. Denies advers s/s with activity.    Exercises Other Exercises Other Exercises: Pt educated on role of OT in acute setting, Safe use of AE/DME for ADL management, falls prevention strategies, and energy conservation strategies including pursed lip breathing and activity pacing. Other Exercises: OT facilitates seated grooming. See ADL section for additional detail.   Shoulder Instructions      Home Living Family/patient expects to be discharged to:: Private residence Living Arrangements: Children Available Help at Discharge: Family;Other (Comment) ([facility]x [facility]) Type of Home: Apartment Home Access: Elevator     Home Layout: One level     Bathroom Shower/Tub: Teacher, early years/pre: Standard     Home Equipment: Other (comment);Grab bars - tub/shower;Grab bars - toilet   Additional Comments: Pt reports having a 2WW      Prior Functioning/Environment Level of Independence: Independent with assistive device(s)        Comments: history of falls, fell yesterday        OT Problem List: Decreased strength;Decreased  coordination;Cardiopulmonary status limiting activity;Decreased activity tolerance;Decreased safety awareness;Decreased cognition;Impaired balance (sitting and/or standing);Decreased knowledge of use of DME or AE;Impaired UE functional use      OT Treatment/Interventions: Self-care/ADL training;Therapeutic exercise;Therapeutic activities;DME and/or AE instruction;Patient/family education;Balance training;Energy conservation    OT Goals(Current goals can be found in the care plan section) Acute Rehab OT Goals Patient Stated Goal: to breath better OT Goal Formulation: With patient Time For Goal Achievement: 10/04/19 Potential to Achieve Goals: Good ADL Goals Pt Will Perform Grooming: sitting;with modified independence (c LRAD PRN for improved safety/functional indep.) Pt Will Perform Lower Body Dressing: sit to/from stand;with set-up;with supervision;with caregiver independent in assisting (c LRAD PRN for improved safety/functional indep.) Pt Will Transfer to Toilet: bedside commode;with modified independence (c LRAD PRN for improved safety/functional indep.) Pt Will Perform Toileting - Clothing Manipulation and hygiene: with modified independence;with adaptive equipment (c LRAD PRN for improved safety/functional indep.)  OT Frequency: Min 2X/week   Barriers to D/C:            Co-evaluation              AM-PAC OT "6 Clicks" Daily Activity     Outcome Measure Help from another person eating meals?: A Little Help from another person taking care of personal grooming?: A Little Help from  another person toileting, which includes using toliet, bedpan, or urinal?: A Little Help from another person bathing (including washing, rinsing, drying)?: A Lot Help from another person to put on and taking off regular upper body clothing?: A Little Help from another person to put on and taking off regular lower body clothing?: A Little 6 Click Score: 17   End of Session    Activity Tolerance:  Patient tolerated treatment well Patient left: in chair;with chair alarm set;with call bell/phone within reach  OT Visit Diagnosis: Other abnormalities of gait and mobility (R26.89);Muscle weakness (generalized) (M62.81)                Time: 3335-4562 OT Time Calculation (min): 34 min Charges:  OT General Charges $OT Visit: 1 Visit OT Evaluation $OT Eval Moderate Complexity: 1 Mod OT Treatments $Self Care/Home Management : 23-37 mins  Shara Blazing, M.S., OTR/L Ascom: [phone] 09/20/19, 1:09 PM

## 2019-09-20 NOTE — Progress Notes (Signed)
Nutrition Brief Note  RD received consult for assessment of nutrition status/requirements per COPD protocol  Wt Readings from Last 15 Encounters:  09/19/19 85.5 kg  08/16/19 85.5 kg  04/29/19 88.3 kg  01/03/19 103.9 kg  02/09/18 88.5 kg  08/12/17 85.3 kg  05/27/17 85.3 kg  02/22/17 89.2 kg  07/15/16 87.5 kg  06/18/16 86.2 kg  02/18/16 88 kg  07/12/15 83 kg  04/25/15 83.9 kg  03/23/15 82.6 kg  10/08/14 82.8 kg   Met with patient and her daughter at bedside. Patient reports she has a good appetite and intake that is unchanged from baseline. Patient reports she is eating well and finishing 100% of her meals. She reports she also usually eats 100% of her meals at home and at [facility]. Patient reports her weight fluctuates some but she is weight-stable at around 185-195 lbs. Noted weights in chart fluctuate between 85-88 kg. Patient is currently documented to be 85.5 kg (188.49 lbs). No muscle or subcutaneous fat wasting noted on Nutrition-Focused Physical Exam. Patient does not meet criteria for malnutrition at this time. Patient denies any educational needs.  Body mass index is 34.48 kg/m. Patient meets criteria for obesity class I based on current BMI.   Current diet order is heart healthy/carbohydrate modified, patient is consuming approximately 100% of meals at this time per patient report. Labs and medications reviewed.   No nutrition interventions warranted at this time. If nutrition issues arise, please consult RD.   Jacklynn Barnacle, MS, RD, LDN Pager number available on Amion

## 2019-09-20 NOTE — Progress Notes (Signed)
Mobility Specialist - Progress Note   09/20/19 1424  Mobility  Activity  (Seated exercises in chair)  Level of Assistance Modified independent, requires aide device or extra time  Assistive Device None  Mobility Response Tolerated well  Mobility performed by Mobility specialist  $Mobility charge 1 Mobility    Pre-mobility: 73 HR, 115/78 BP, 100% SpO2 Post-mobility: 100 HR, 107/64 BP, 100% SpO2   Pt was in recliner upon arrival. Pt agreed to session. Pt did not want to ambulate at the moment, but agreed to perform seated exercises in the chair. Pt performed 10 rep/leg (seated marching, seated kicks, and ankle pumps). Pt tolerated session well. Pt left in recliner with call bell/ phone in reach and daughter present in room. Nurse was notified.    Summer Bradley Mobility Specialist  09/20/19, 2:33 PM

## 2019-09-21 DIAGNOSIS — R531 Weakness: Secondary | ICD-10-CM

## 2019-09-21 LAB — GLUCOSE, CAPILLARY: Glucose-Capillary: 151 mg/dL — ABNORMAL HIGH (ref 70–99)

## 2019-09-21 MED ORDER — DOXYCYCLINE HYCLATE 100 MG PO TABS: 100.0000 mg | ORAL_TABLET | Freq: Two times a day (BID) | ORAL | 0 refills | Status: AC

## 2019-09-21 MED ORDER — PREDNISONE 5 MG PO TABS
ORAL_TABLET | ORAL | 0 refills | Status: DC
Start: 2019-09-22 — End: 2020-11-18

## 2019-09-21 NOTE — Discharge Summary (Signed)
[facility] NAME: Summer Bradley    MR#:  [id number]  DATE OF BIRTH:  07/14/1945  DATE OF ADMISSION:  09/19/2019 ADMITTING PHYSICIAN: Ivor Costa, MD  DATE OF DISCHARGE: 09/21/2019 10:16 AM  PRIMARY CARE PHYSICIAN: [place]    ADMISSION DIAGNOSIS:  COPD exacerbation ([place]) [J44.1] Fall, initial encounter [W19.XXXA] Laceration of left knee, initial encounter [S81.012A]  DISCHARGE DIAGNOSIS:  Principal Problem:   COPD exacerbation ([place]) Active Problems:   Stroke ([place])   HLD (hyperlipidemia)   Atrial fibrillation with rapid ventricular response (HCC)   Anxiety   Acute on chronic respiratory failure with hypoxia (HCC)   Hypothyroidism   Diabetes mellitus without complication (HCC)   GERD (gastroesophageal reflux disease)   Fall   Chronic a-fib ([place])   SECONDARY DIAGNOSIS:   Past Medical History:  Diagnosis Date  . A-fib ([place])   . Anxiety   . COPD (chronic obstructive pulmonary disease) ([place])   . Cough    CHONIC  . Depression   . Dysrhythmia   . GERD (gastroesophageal reflux disease)   . Heart disease   . High cholesterol   . Hypertension   . Hypothyroidism   . Orthopnea   . Oxygen dependent    2/L CONTINUOUSLY  . Stroke ([place])   . Vertigo   . Wheezing     HOSPITAL COURSE:   1.  Acute on chronic hypoxic respiratory failure.  Patient is chronic respiratory failure continue her chronic oxygen at home. 2.  COPD exacerbation.  The patient was given Solu-Medrol 40 mg IV twice a day while in the hospital and will switch over to prednisone upon going home.  She is she does take chronic prednisone 5 mg daily I will do a taper of the prednisone down to her chronic 5 mg daily. 3.  Chronic atrial fibrillation with history of stroke on Eliquis for anticoagulation.  Patient on Cardizem and bisoprolol. 4.  Hypothyroidism unspecified on levothyroxine 5.  Anxiety on BuSpar and Ativan. 6.  Weakness.  Appreciate  physical therapy evaluation.  Only walked 3 steps with physical therapy yesterday.  In speaking with Dr. Lutricia Feil from pace program they will bring the patient to the pace program back-and-forth and will get the patient stronger. 7.  Laceration left knee.  DISCHARGE CONDITIONS:   Satisfactory  CONSULTS OBTAINED:  None  DRUG ALLERGIES:   Allergies  Allergen Reactions  . Lisinopril Cough    DISCHARGE MEDICATIONS:   Allergies as of 09/21/2019      Reactions   Lisinopril Cough      Medication List    STOP taking these medications   Combivent Respimat 20-100 MCG/ACT Aers respimat Generic drug: Ipratropium-Albuterol     TAKE these medications   albuterol 108 (90 Base) MCG/ACT inhaler Commonly known as: VENTOLIN HFA Inhale 2 puffs into the lungs every 4 (four) hours as needed for wheezing or shortness of breath.   albuterol (2.5 MG/3ML) 0.083% nebulizer solution Commonly known as: PROVENTIL Take 2.5 mg by nebulization every 6 (six) hours as needed for wheezing or shortness of breath.   apixaban 5 MG Tabs tablet Commonly known as: ELIQUIS Take 1 tablet (5 mg total) by mouth 2 (two) times daily.   bisoprolol 5 MG tablet Commonly known as: ZEBETA Take 0.5 tablets (2.5 mg total) by mouth 2 (two) times daily.   budesonide-formoterol 160-4.5 MCG/ACT inhaler Commonly known as: SYMBICORT Inhale 2 puffs into the lungs 2 (two) times daily.  busPIRone 15 MG tablet Commonly known as: BUSPAR Take 15 mg by mouth 2 (two) times daily.   Calcium Carbonate-Vitamin D 600-400 MG-UNIT tablet Take 1 tablet by mouth 2 (two) times daily.   citalopram 40 MG tablet Commonly known as: CELEXA Take 40 mg by mouth at bedtime.   dextromethorphan-guaiFENesin 10-100 MG/5ML liquid Commonly known as: ROBITUSSIN-DM Take 5 mLs by mouth every 4 (four) hours as needed for cough.   diltiazem 300 MG 24 hr capsule Commonly known as: CARDIZEM CD Take 1 capsule (300 mg total) by mouth daily.    docusate sodium 100 MG capsule Commonly known as: COLACE Take 100 mg by mouth daily.   doxycycline 100 MG tablet Commonly known as: VIBRA-TABS Take 1 tablet (100 mg total) by mouth every 12 (twelve) hours for 5 days.   EUCERIN EX Apply 1 application topically 2 (two) times daily as needed.   famotidine 20 MG tablet Commonly known as: PEPCID Take 1 tablet (20 mg total) by mouth daily.   furosemide 20 MG tablet Commonly known as: LASIX Take 2 tablets (40 mg total) by mouth every morning.   Imodium A-D 2 MG tablet Generic drug: loperamide Take 2 mg by mouth 4 (four) times daily as needed for diarrhea or loose stools.   levothyroxine 175 MCG tablet Commonly known as: SYNTHROID Take 150 mcg by mouth daily before breakfast.   LORazepam 0.5 MG tablet Commonly known as: ATIVAN Take 0.5 mg by mouth 2 (two) times daily.   melatonin 3 MG Tabs tablet Take 1.5-3 mg by mouth at bedtime.   metFORMIN 500 MG tablet Commonly known as: GLUCOPHAGE Take 500 mg by mouth 2 (two) times daily with a meal.   metolazone 2.5 MG tablet Commonly known as: ZAROXOLYN Take 2.5 mg by mouth once a week. On Wednesdays with furosemide in the am   polyethylene glycol 17 g packet Commonly known as: MIRALAX / GLYCOLAX Take 17 g by mouth daily as needed.   potassium chloride SA 20 MEQ tablet Commonly known as: KLOR-CON Take 2 tablets (40 mEq total) by mouth 2 (two) times daily.   predniSONE 5 MG tablet Commonly known as: DELTASONE 6 tabs po day 1; 4 tabs po day2; 2 tab po day3 then take your usual 1 tab daily Start taking on: September 22, 2019 What changed:   how much to take  how to take this  when to take this  additional instructions   QUEtiapine 25 MG tablet Commonly known as: SEROQUEL Take 1 tablet (25 mg total) by mouth at bedtime as needed.   Spiriva Respimat 2.5 MCG/ACT Aers Generic drug: Tiotropium Bromide Monohydrate Inhale 1 puff into the lungs daily.   SYSTANE ULTRA  OP Place 1 drop into both eyes every 4 (four) hours as needed (dry eyes).        DISCHARGE INSTRUCTIONS:   Follow-up with pace program same day of discharge  If you experience worsening of your admission symptoms, develop shortness of breath, life threatening emergency, suicidal or homicidal thoughts you must seek medical attention immediately by calling 911 or calling your MD immediately  if symptoms less severe.  You Must read complete instructions/literature along with all the possible adverse reactions/side effects for all the Medicines you take and that have been prescribed to you. Take any new Medicines after you have completely understood and accept all the possible adverse reactions/side effects.   Please note  You were cared for by a hospitalist during your hospital stay. If you have  any questions about your discharge medications or the care you received while you were in the hospital after you are discharged, you can call the unit and asked to speak with the hospitalist on call if the hospitalist that took care of you is not available. Once you are discharged, your primary care physician will handle any further medical issues. Please note that NO REFILLS for any discharge medications will be authorized once you are discharged, as it is imperative that you return to your primary care physician (or establish a relationship with a primary care physician if you do not have one) for your aftercare needs so that they can reassess your need for medications and monitor your lab values.    Today   CHIEF COMPLAINT:   Chief Complaint  Patient presents with  . Shortness of Breath  . Fall    HISTORY OF PRESENT ILLNESS:  Summer Bradley  is a 74 y.o. female came in with shortness of breath and a fall   VITAL SIGNS:  Blood pressure (!) 118/91, pulse (!) 56, temperature 97.6 F (36.4 C), temperature source Oral, resp. rate 20, height 5\' 2"  (1.575 m), weight 85.5 kg, SpO2 99 %.  I/O:     Intake/Output Summary (Last 24 hours) at 09/21/2019 1731 Last data filed at 09/21/2019 0900 Gross per 24 hour  Intake 243 ml  Output 150 ml  Net 93 ml    PHYSICAL EXAMINATION:  GENERAL:  74 y.o.-year-old patient lying in the bed with no acute distress.  EYES: Pupils equal, round, reactive to light and accommodation. No scleral icterus. HEENT: Head atraumatic, normocephalic. Oropharynx and nasopharynx clear.   LUNGS: Decreased breath sounds bilaterally, no wheezing, rales,rhonchi or crepitation. No use of accessory muscles of respiration.  CARDIOVASCULAR: S1, S2 normal. No murmurs, rubs, or gallops.  ABDOMEN: Soft, non-tender, non-distended. Bowel sounds present. No organomegaly or mass.  EXTREMITIES: Trace pedal edema.  NEUROLOGIC: Cranial nerves II through XII are intact. Muscle strength 5/5 in all extremities. Sensation intact. Gait not checked.  PSYCHIATRIC: The patient is alert and oriented x 3.  SKIN: Skin tear left leg  DATA REVIEW:   CBC Recent Labs  Lab 09/20/19 0440  WBC 15.2*  HGB 13.3  HCT 39.2  PLT 311    Chemistries  Recent Labs  Lab 09/19/19 0823  NA 140  K 4.3  CL 102  CO2 29  GLUCOSE 112*  BUN 20  CREATININE 0.95  CALCIUM 8.9  MG 2.1  AST 18  ALT 20  ALKPHOS 45  BILITOT 1.8*    Microbiology Results  Results for orders placed or performed during the hospital encounter of 09/19/19  SARS Coronavirus 2 by RT PCR (hospital order, performed in [facility] hospital lab) Nasopharyngeal Nasopharyngeal Swab     Status: None   Collection Time: 09/19/19  8:23 AM   Specimen: Nasopharyngeal Swab  Result Value Ref Range Status   SARS Coronavirus 2 NEGATIVE NEGATIVE Final    Comment: (NOTE) SARS-CoV-2 target nucleic acids are NOT DETECTED.  The SARS-CoV-2 RNA is generally detectable in upper and lower respiratory specimens during the acute phase of infection. The lowest concentration of SARS-CoV-2 viral copies this assay can detect is 250 copies / mL.  A negative result does not preclude SARS-CoV-2 infection and should not be used as the sole basis for treatment or other patient management decisions.  A negative result may occur with improper specimen collection / handling, submission of specimen other than nasopharyngeal swab, presence of viral mutation(s)  within the areas targeted by this assay, and inadequate number of viral copies (<250 copies / mL). A negative result must be combined with clinical observations, patient history, and epidemiological information.  Fact Sheet for Patients:   StrictlyIdeas.no  Fact Sheet for Healthcare Providers: BankingDealers.co.za  This test is not yet approved or  cleared by the [place] FDA and has been authorized for detection and/or diagnosis of SARS-CoV-2 by FDA under an Emergency Use Authorization (EUA).  This EUA will remain in effect (meaning this test can be used) for the duration of the COVID-19 declaration under Section 564(b)(1) of the Act, 21 U.S.C. section 360bbb-3(b)(1), unless the authorization is terminated or revoked sooner.  Performed at [facility], [address] [place], [place] [address]   Culture, blood (routine x 2)     Status: None (Preliminary result)   Collection Time: 09/19/19  8:23 AM   Specimen: BLOOD  Result Value Ref Range Status   Specimen Description BLOOD RIGHT FA  Final   Special Requests   Final    BOTTLES DRAWN AEROBIC AND ANAEROBIC Blood Culture adequate volume   Culture   Final    NO GROWTH 2 DAYS Performed at [facility], [address] [place], [place] [address]    Report Status PENDING  Incomplete  Culture, blood (routine x 2)     Status: None (Preliminary result)   Collection Time: 09/19/19  8:23 AM   Specimen: BLOOD  Result Value Ref Range Status   Specimen Description BLOOD RIGHT HAND  Final   Special Requests   Final    BOTTLES DRAWN AEROBIC AND ANAEROBIC Blood  Culture adequate volume   Culture   Final    NO GROWTH 2 DAYS Performed at [facility], [address], [place], [place] [address]    Report Status PENDING  Incomplete     Management plans discussed with the patient, family and they are in agreement.  CODE STATUS:  Code Status History    Date Active Date Inactive Code Status Order ID Comments User Context   09/19/2019 1540 09/21/2019 1616 DNR [id number]  Ivor Costa, MD ED   08/14/2019 0007 08/17/2019 1856 DNR [id number]  Lenore Cordia, MD ED   04/28/2019 1516 04/29/2019 1937 DNR [id number]  Lorella Nimrod, MD ED   04/28/2019 1516 04/28/2019 1516 DNR [id number]  Lorella Nimrod, MD ED   04/28/2019 1515 04/28/2019 1516 Full Code [id number]  Lorella Nimrod, MD ED   12/28/2018 1934 01/03/2019 1857 DNR [id number]  Awilda Bill, NP Inpatient   12/23/2018 2233 12/28/2018 1934 Full Code [id number]  Mansy, Arvella Merles, MD ED   02/03/2018 0934 02/10/2018 1954 Full Code [id number]  Arta Silence, MD Inpatient   02/02/2014 0933 03/05/2014 1624 Full Code [id number]  Wilhelmina Mcardle, MD Inpatient   02/01/2014 1236 02/02/2014 0933 Full Code [id number]  Rob Hickman, MD Inpatient   01/29/2014 1119 02/01/2014 1236 Full Code [id number]  Roland Rack, MD Inpatient   Advance Care Planning Activity    Questions for Most Recent Historical Code Status (Order [id number])    Question Answer   In the event of cardiac or respiratory ARREST Do not call a "code blue"   In the event of cardiac or respiratory ARREST Do not perform Intubation, CPR, defibrillation or ACLS   In the event of cardiac or respiratory ARREST Use medication by any route, position, wound care, and other measures to relive pain and suffering. May use oxygen, suction and manual treatment of  airway obstruction as needed for comfort.      TOTAL TIME TAKING CARE OF THIS PATIENT: 35 minutes.    Loletha Grayer M.D on 09/21/2019 at 5:31 PM  Between 7am to 6pm - Pager -  [phone]  After 6pm go to [email or web address] - password [email or web address]  [facility]  CC: Primary care physician; [place]

## 2019-09-21 NOTE — Progress Notes (Signed)
Summer Bradley and O x4. VSS. Pt tolerating diet well. No complaints of nausea or vomiting. IV removed intact, prescriptions given. Discharge papers sent with patient. Patient discharged via wheelchair with NT.  Allergies as of 09/21/2019      Reactions   Lisinopril Cough      Medication List    STOP taking these medications   Combivent Respimat 20-100 MCG/ACT Aers respimat Generic drug: Ipratropium-Albuterol     TAKE these medications   albuterol 108 (90 Base) MCG/ACT inhaler Commonly known as: VENTOLIN HFA Inhale 2 puffs into the lungs every 4 (four) hours as needed for wheezing or shortness of breath.   albuterol (2.5 MG/3ML) 0.083% nebulizer solution Commonly known as: PROVENTIL Take 2.5 mg by nebulization every 6 (six) hours as needed for wheezing or shortness of breath.   apixaban 5 MG Tabs tablet Commonly known as: ELIQUIS Take 1 tablet (5 mg total) by mouth 2 (two) times daily.   bisoprolol 5 MG tablet Commonly known as: ZEBETA Take 0.5 tablets (2.5 mg total) by mouth 2 (two) times daily.   budesonide-formoterol 160-4.5 MCG/ACT inhaler Commonly known as: SYMBICORT Inhale 2 puffs into the lungs 2 (two) times daily.   busPIRone 15 MG tablet Commonly known as: BUSPAR Take 15 mg by mouth 2 (two) times daily.   Calcium Carbonate-Vitamin D 600-400 MG-UNIT tablet Take 1 tablet by mouth 2 (two) times daily.   citalopram 40 MG tablet Commonly known as: CELEXA Take 40 mg by mouth at bedtime.   dextromethorphan-guaiFENesin 10-100 MG/5ML liquid Commonly known as: ROBITUSSIN-DM Take 5 mLs by mouth every 4 (four) hours as needed for cough.   diltiazem 300 MG 24 hr capsule Commonly known as: CARDIZEM CD Take 1 capsule (300 mg total) by mouth daily.   docusate sodium 100 MG capsule Commonly known as: COLACE Take 100 mg by mouth daily.   doxycycline 100 MG tablet Commonly known as: VIBRA-TABS Take 1 tablet (100 mg total) by mouth every 12 (twelve) hours for 5  days.   EUCERIN EX Apply 1 application topically 2 (two) times daily as needed.   famotidine 20 MG tablet Commonly known as: PEPCID Take 1 tablet (20 mg total) by mouth daily.   furosemide 20 MG tablet Commonly known as: LASIX Take 2 tablets (40 mg total) by mouth every morning.   Imodium Bradley-D 2 MG tablet Generic drug: loperamide Take 2 mg by mouth 4 (four) times daily as needed for diarrhea or loose stools.   levothyroxine 175 MCG tablet Commonly known as: SYNTHROID Take 150 mcg by mouth daily before breakfast.   LORazepam 0.5 MG tablet Commonly known as: ATIVAN Take 0.5 mg by mouth 2 (two) times daily.   melatonin 3 MG Tabs tablet Take 1.5-3 mg by mouth at bedtime.   metFORMIN 500 MG tablet Commonly known as: GLUCOPHAGE Take 500 mg by mouth 2 (two) times daily with Bradley meal.   metolazone 2.5 MG tablet Commonly known as: ZAROXOLYN Take 2.5 mg by mouth once Bradley week. On Wednesdays with furosemide in the am   polyethylene glycol 17 g packet Commonly known as: MIRALAX / GLYCOLAX Take 17 g by mouth daily as needed.   potassium chloride SA 20 MEQ tablet Commonly known as: KLOR-CON Take 2 tablets (40 mEq total) by mouth 2 (two) times daily.   predniSONE 5 MG tablet Commonly known as: DELTASONE 6 tabs po day 1; 4 tabs po day2; 2 tab po day3 then take your usual 1 tab daily Start  taking on: September 22, 2019 What changed:   how much to take  how to take this  when to take this  additional instructions   QUEtiapine 25 MG tablet Commonly known as: SEROQUEL Take 1 tablet (25 mg total) by mouth at bedtime as needed.   Spiriva Respimat 2.5 MCG/ACT Aers Generic drug: Tiotropium Bromide Monohydrate Inhale 1 puff into the lungs daily.   SYSTANE ULTRA OP Place 1 drop into both eyes every 4 (four) hours as needed (dry eyes).       Vitals:   09/20/19 2006 09/21/19 0439  BP:  (!) 118/91  Pulse:  (!) 56  Resp:  20  Temp:  97.6 F (36.4 C)  SpO2: 97% 99%    Summer Bradley

## 2019-09-21 NOTE — Discharge Instructions (Signed)
Chronic Obstructive Pulmonary Disease Exacerbation  Chronic obstructive pulmonary disease (COPD) is a long-term (chronic) condition that affects the lungs. COPD is a general term that can be used to describe many different lung problems that cause lung swelling (inflammation) and limit airflow, including chronic bronchitis and emphysema. COPD exacerbations are episodes when breathing symptoms become much worse and require extra treatment. COPD exacerbations are usually caused by infections. Without treatment, COPD exacerbations can be severe and even life threatening. Frequent COPD exacerbations can cause further damage to the lungs. What are the causes? This condition may be caused by:  Respiratory infections, including viral and bacterial infections.  Exposure to smoke.  Exposure to air pollution, chemical fumes, or dust.  Things that give you an allergic reaction (allergens).  Not taking your usual COPD medicines as directed.  Underlying medical problems, such as congestive heart failure or infections not involving the lungs. In many cases, the cause (trigger) of this condition is not known. What increases the risk? The following factors may make you more likely to develop this condition:  Smoking cigarettes.  Old age.  Frequent prior COPD exacerbations. What are the signs or symptoms? Symptoms of this condition include:  Increased coughing.  Increased production of mucus from your lungs (sputum).  Increased wheezing.  Increased shortness of breath.  Rapid or labored breathing.  Chest tightness.  Less energy than usual.  Sleep disruption from symptoms.  Confusion or increased sleepiness. Often these symptoms happen or get worse even with the use of medicines. How is this diagnosed? This condition is diagnosed based on:  Your medical history.  A physical exam. You may also have tests, including:  A chest X-ray.  Blood tests.  Lung (pulmonary) function  tests. How is this treated? Treatment for this condition depends on the severity and cause of the symptoms. You may need to be admitted to a hospital for treatment. Some of the treatments commonly used to treat COPD exacerbations are:  Antibiotic medicines. These may be used for severe exacerbations caused by a lung infection, such as pneumonia.  Bronchodilators. These are inhaled medicines that expand the air passages and allow increased airflow.  Steroid medicines. These act to reduce inflammation in the airways. They may be given with an inhaler, taken by mouth, or given through an IV tube inserted into one of your veins.  Supplemental oxygen therapy.  Airway clearing techniques, such as noninvasive ventilation (NIV) and positive expiratory pressure (PEP). These provide respiratory support through a mask or other noninvasive device. An example of this would be using a continuous positive airway pressure (CPAP) machine to improve delivery of oxygen into your lungs. Follow these instructions at home: Medicines  Take over-the-counter and prescription medicines only as told by your health care provider. It is important to use correct technique with inhaled medicines.  If you were prescribed an antibiotic medicine or oral steroid, take it as told by your health care provider. Do not stop taking the medicine even if you start to feel better. Lifestyle  Eat a healthy diet.  Exercise regularly.  Get plenty of sleep.  Avoid exposure to all substances that irritate the airway, especially to tobacco smoke.  Wash your hands often with soap and water to reduce the risk of infection. If soap and water are not available, use hand sanitizer.  During flu season, avoid enclosed spaces that are crowded with people. General instructions  Drink enough fluid to keep your urine clear or pale yellow (unless you have a medical   condition that requires fluid restriction).  Use a cool mist vaporizer. This  humidifies the air and makes it easier for you to clear your chest when you cough.  If you have a home nebulizer and oxygen, continue to use them as told by your health care provider.  Keep all follow-up visits as told by your health care provider. This is important. How is this prevented?  Stay up-to-date on pneumococcal and influenza (flu) vaccines. A flu shot is recommended every year to help prevent exacerbations.  Do not use any products that contain nicotine or tobacco, such as cigarettes and e-cigarettes. Quitting smoking is very important in preventing COPD from getting worse and in preventing exacerbations from happening as often. If you need help quitting, ask your health care provider.  Follow all instructions for pulmonary rehabilitation after a recent exacerbation. This can help prevent future exacerbations.  Work with your health care provider to develop and follow an action plan. This tells you what steps to take when you experience certain symptoms. Contact a health care provider if:  You have a worsening of your regular COPD symptoms. Get help right away if:  You have worsening shortness of breath, even when resting.  You have trouble talking.  You have severe chest pain.  You cough up blood.  You have a fever.  You have weakness, vomit repeatedly, or faint.  You feel confused.  You are not able to sleep because of your symptoms.  You have trouble doing daily activities. Summary  COPD exacerbations are episodes when breathing symptoms become much worse and require extra treatment above your normal treatment.  Exacerbations can be severe and even life threatening. Frequent COPD exacerbations can cause further damage to your lungs.  COPD exacerbations are usually triggered by infections such as the flu, colds, and even pneumonia.  Treatment for this condition depends on the severity and cause of the symptoms. You may need to be admitted to a hospital for  treatment.  Quitting smoking is very important to prevent COPD from getting worse and to prevent exacerbations from happening as often. This information is not intended to replace advice given to you by your health care provider. Make sure you discuss any questions you have with your health care provider. Document Revised: 01/15/2017 Document Reviewed: 03/09/2016 Elsevier Patient Education  2020 [employer]  

## 2019-09-21 NOTE — TOC Transition Note (Signed)
Transition of Care [facility]) - CM/SW Discharge Note   Patient Details  Name: Summer Bradley MRN: [id number] Date of Birth: 1945/05/03  Transition of [facility]) CM/SW Contact:  Beverly Sessions, RN Phone Number: 09/21/2019, 10:23 AM   Clinical Narrative:    Patient to discharge today with resumption of [facility] services. [facility] to provide transport at discharge.  MD has contacted [facility] directly      Barriers to Discharge: No Barriers Identified   Patient Goals and CMS Choice        Discharge Placement                       Discharge Plan and Services   Discharge Planning Services: CM Consult                                 Social Determinants of Health (SDOH) Interventions     Readmission Risk Interventions Readmission Risk Prevention Plan 09/21/2019 09/20/2019  Transportation Screening Complete Complete  PCP or Specialist Appt within 3-5 Days Complete Complete  HRI or Home Care Consult Complete Complete  Palliative Care Screening Not Applicable Not Applicable  Medication Review (RN Care Manager) Complete Complete  Some recent data might be hidden

## 2019-09-24 LAB — CULTURE, BLOOD (ROUTINE X 2)
Culture: NO GROWTH
Culture: NO GROWTH
Special Requests: ADEQUATE
Special Requests: ADEQUATE

## 2020-10-22 ENCOUNTER — Other Ambulatory Visit: Payer: Self-pay

## 2020-10-22 ENCOUNTER — Emergency Department: Payer: Medicare (Managed Care)

## 2020-10-22 ENCOUNTER — Emergency Department
Admission: EM | Admit: 2020-10-22 | Discharge: 2020-10-22 | Disposition: A | Discharge status: Home / Self Care | Payer: Medicare (Managed Care) | Attending: Emergency Medicine | Admitting: Emergency Medicine

## 2020-10-22 DIAGNOSIS — S51812A Laceration without foreign body of left forearm, initial encounter: Secondary | ICD-10-CM | POA: Diagnosis not present

## 2020-10-22 DIAGNOSIS — W19XXXA Unspecified fall, initial encounter: Secondary | ICD-10-CM | POA: Insufficient documentation

## 2020-10-22 DIAGNOSIS — R0602 Shortness of breath: Secondary | ICD-10-CM | POA: Diagnosis not present

## 2020-10-22 DIAGNOSIS — Z5321 Procedure and treatment not carried out due to patient leaving prior to being seen by health care provider: Secondary | ICD-10-CM | POA: Diagnosis not present

## 2020-10-22 DIAGNOSIS — J449 Chronic obstructive pulmonary disease, unspecified: Secondary | ICD-10-CM | POA: Diagnosis not present

## 2020-10-22 LAB — CBC
HCT: 39.6 % (ref 36.0–46.0)
Hemoglobin: 12 g/dL (ref 12.0–15.0)
MCH: 23.7 pg — ABNORMAL LOW (ref 26.0–34.0)
MCHC: 30.3 g/dL (ref 30.0–36.0)
MCV: 78.1 fL — ABNORMAL LOW (ref 80.0–100.0)
Platelets: 390 10*3/uL (ref 150–400)
RBC: 5.07 MIL/uL (ref 3.87–5.11)
RDW: 19.2 % — ABNORMAL HIGH (ref 11.5–15.5)
WBC: 14.9 10*3/uL — ABNORMAL HIGH (ref 4.0–10.5)
nRBC: 0 % (ref 0.0–0.2)

## 2020-10-22 LAB — BASIC METABOLIC PANEL
Anion gap: 12 (ref 5–15)
BUN: 24 mg/dL — ABNORMAL HIGH (ref 8–23)
CO2: 30 mmol/L (ref 22–32)
Calcium: 9.7 mg/dL (ref 8.9–10.3)
Chloride: 98 mmol/L (ref 98–111)
Creatinine, Ser: 1.23 mg/dL — ABNORMAL HIGH (ref 0.44–1.00)
GFR, Estimated: 46 mL/min — ABNORMAL LOW (ref >60)
Glucose, Bld: 149 mg/dL — ABNORMAL HIGH (ref 70–99)
Potassium: 3.9 mmol/L (ref 3.5–5.1)
Sodium: 140 mmol/L (ref 135–145)

## 2020-10-22 LAB — TROPONIN I (HIGH SENSITIVITY): Troponin I (High Sensitivity): 10 ng/L (ref <18)

## 2020-10-22 NOTE — ED Triage Notes (Signed)
Presents to ED via EMS with c/o of SOB and fall PTA. Pt states breathing issues started at 0400 this morning. Pt wears 3L/min via [place] at all times. Pt on 4L/min via [place] per EMS. Pt has HX of COPD.   Pt also states fall this morning "but doesn't know what happned or what I hit". Pt has skin tear to L forearm, bleeding controlled at this time.   EMS gave 1 duoneb and albuterol [place] PTA.

## 2020-11-16 ENCOUNTER — Other Ambulatory Visit: Payer: Self-pay

## 2020-11-16 ENCOUNTER — Inpatient Hospital Stay
Admission: EM | Admit: 2020-11-16 | Discharge: 2020-11-18 | DRG: 190 | Disposition: A | Discharge status: Home Health | Payer: Medicare (Managed Care) | Attending: Internal Medicine | Admitting: Internal Medicine

## 2020-11-16 ENCOUNTER — Emergency Department: Payer: Medicare (Managed Care)

## 2020-11-16 ENCOUNTER — Encounter: Payer: Self-pay | Admitting: Emergency Medicine

## 2020-11-16 DIAGNOSIS — E7800 Pure hypercholesterolemia, unspecified: Secondary | ICD-10-CM | POA: Diagnosis present

## 2020-11-16 DIAGNOSIS — J189 Pneumonia, unspecified organism: Secondary | ICD-10-CM

## 2020-11-16 DIAGNOSIS — F419 Anxiety disorder, unspecified: Secondary | ICD-10-CM | POA: Diagnosis present

## 2020-11-16 DIAGNOSIS — Z7901 Long term (current) use of anticoagulants: Secondary | ICD-10-CM | POA: Diagnosis not present

## 2020-11-16 DIAGNOSIS — Z888 Allergy status to other drugs, medicaments and biological substances status: Secondary | ICD-10-CM

## 2020-11-16 DIAGNOSIS — J9621 Acute and chronic respiratory failure with hypoxia: Secondary | ICD-10-CM | POA: Diagnosis present

## 2020-11-16 DIAGNOSIS — Z7951 Long term (current) use of inhaled steroids: Secondary | ICD-10-CM

## 2020-11-16 DIAGNOSIS — E876 Hypokalemia: Secondary | ICD-10-CM

## 2020-11-16 DIAGNOSIS — J440 Chronic obstructive pulmonary disease with acute lower respiratory infection: Principal | ICD-10-CM | POA: Diagnosis present

## 2020-11-16 DIAGNOSIS — Z28310 Unvaccinated for covid-19: Secondary | ICD-10-CM

## 2020-11-16 DIAGNOSIS — I480 Paroxysmal atrial fibrillation: Secondary | ICD-10-CM | POA: Diagnosis not present

## 2020-11-16 DIAGNOSIS — J9601 Acute respiratory failure with hypoxia: Secondary | ICD-10-CM | POA: Diagnosis not present

## 2020-11-16 DIAGNOSIS — I4891 Unspecified atrial fibrillation: Secondary | ICD-10-CM | POA: Diagnosis present

## 2020-11-16 DIAGNOSIS — E872 Acidosis, unspecified: Secondary | ICD-10-CM | POA: Diagnosis present

## 2020-11-16 DIAGNOSIS — Z833 Family history of diabetes mellitus: Secondary | ICD-10-CM | POA: Diagnosis not present

## 2020-11-16 DIAGNOSIS — Z79899 Other long term (current) drug therapy: Secondary | ICD-10-CM

## 2020-11-16 DIAGNOSIS — I10 Essential (primary) hypertension: Secondary | ICD-10-CM | POA: Diagnosis present

## 2020-11-16 DIAGNOSIS — Z8673 Personal history of transient ischemic attack (TIA), and cerebral infarction without residual deficits: Secondary | ICD-10-CM | POA: Diagnosis not present

## 2020-11-16 DIAGNOSIS — I5032 Chronic diastolic (congestive) heart failure: Secondary | ICD-10-CM | POA: Diagnosis present

## 2020-11-16 DIAGNOSIS — J441 Chronic obstructive pulmonary disease with (acute) exacerbation: Secondary | ICD-10-CM | POA: Diagnosis present

## 2020-11-16 DIAGNOSIS — K219 Gastro-esophageal reflux disease without esophagitis: Secondary | ICD-10-CM | POA: Diagnosis present

## 2020-11-16 DIAGNOSIS — Z7952 Long term (current) use of systemic steroids: Secondary | ICD-10-CM

## 2020-11-16 DIAGNOSIS — I639 Cerebral infarction, unspecified: Secondary | ICD-10-CM | POA: Diagnosis present

## 2020-11-16 DIAGNOSIS — E119 Type 2 diabetes mellitus without complications: Secondary | ICD-10-CM | POA: Diagnosis present

## 2020-11-16 DIAGNOSIS — Z87891 Personal history of nicotine dependence: Secondary | ICD-10-CM

## 2020-11-16 DIAGNOSIS — Z7984 Long term (current) use of oral hypoglycemic drugs: Secondary | ICD-10-CM

## 2020-11-16 DIAGNOSIS — Z20822 Contact with and (suspected) exposure to covid-19: Secondary | ICD-10-CM | POA: Diagnosis present

## 2020-11-16 DIAGNOSIS — Z9981 Dependence on supplemental oxygen: Secondary | ICD-10-CM | POA: Diagnosis not present

## 2020-11-16 DIAGNOSIS — E785 Hyperlipidemia, unspecified: Secondary | ICD-10-CM | POA: Diagnosis present

## 2020-11-16 DIAGNOSIS — I272 Pulmonary hypertension, unspecified: Secondary | ICD-10-CM | POA: Diagnosis present

## 2020-11-16 DIAGNOSIS — E039 Hypothyroidism, unspecified: Secondary | ICD-10-CM | POA: Diagnosis present

## 2020-11-16 DIAGNOSIS — R609 Edema, unspecified: Secondary | ICD-10-CM | POA: Diagnosis not present

## 2020-11-16 DIAGNOSIS — J418 Mixed simple and mucopurulent chronic bronchitis: Secondary | ICD-10-CM | POA: Diagnosis present

## 2020-11-16 DIAGNOSIS — J9620 Acute and chronic respiratory failure, unspecified whether with hypoxia or hypercapnia: Secondary | ICD-10-CM | POA: Diagnosis present

## 2020-11-16 DIAGNOSIS — R0602 Shortness of breath: Secondary | ICD-10-CM | POA: Diagnosis not present

## 2020-11-16 DIAGNOSIS — R531 Weakness: Secondary | ICD-10-CM | POA: Diagnosis not present

## 2020-11-16 DIAGNOSIS — I4819 Other persistent atrial fibrillation: Secondary | ICD-10-CM | POA: Diagnosis present

## 2020-11-16 DIAGNOSIS — F32A Depression, unspecified: Secondary | ICD-10-CM | POA: Diagnosis present

## 2020-11-16 DIAGNOSIS — I110 Hypertensive heart disease with heart failure: Secondary | ICD-10-CM | POA: Diagnosis present

## 2020-11-16 DIAGNOSIS — Z79890 Hormone replacement therapy (postmenopausal): Secondary | ICD-10-CM

## 2020-11-16 LAB — RESP PANEL BY RT-PCR (FLU A&B, COVID) ARPGX2
Influenza A by PCR: NEGATIVE
Influenza B by PCR: NEGATIVE
SARS Coronavirus 2 by RT PCR: NEGATIVE

## 2020-11-16 LAB — CBC WITH DIFFERENTIAL/PLATELET
Abs Immature Granulocytes: 0.15 10*3/uL — ABNORMAL HIGH (ref 0.00–0.07)
Basophils Absolute: 0.1 10*3/uL (ref 0.0–0.1)
Basophils Relative: 0 %
Eosinophils Absolute: 0 10*3/uL (ref 0.0–0.5)
Eosinophils Relative: 0 %
HCT: 38.7 % (ref 36.0–46.0)
Hemoglobin: 11.9 g/dL — ABNORMAL LOW (ref 12.0–15.0)
Immature Granulocytes: 1 %
Lymphocytes Relative: 8 %
Lymphs Abs: 1.2 10*3/uL (ref 0.7–4.0)
MCH: 23.9 pg — ABNORMAL LOW (ref 26.0–34.0)
MCHC: 30.7 g/dL (ref 30.0–36.0)
MCV: 77.7 fL — ABNORMAL LOW (ref 80.0–100.0)
Monocytes Absolute: 1.4 10*3/uL — ABNORMAL HIGH (ref 0.1–1.0)
Monocytes Relative: 8 %
Neutro Abs: 13.4 10*3/uL — ABNORMAL HIGH (ref 1.7–7.7)
Neutrophils Relative %: 83 %
Platelets: 490 10*3/uL — ABNORMAL HIGH (ref 150–400)
RBC: 4.98 MIL/uL (ref 3.87–5.11)
RDW: 19.5 % — ABNORMAL HIGH (ref 11.5–15.5)
WBC: 16.2 10*3/uL — ABNORMAL HIGH (ref 4.0–10.5)
nRBC: 0 % (ref 0.0–0.2)

## 2020-11-16 LAB — LACTIC ACID, PLASMA
Lactic Acid, Venous: 2.5 mmol/L (ref 0.5–1.9)
Lactic Acid, Venous: 2.9 mmol/L (ref 0.5–1.9)
Lactic Acid, Venous: 3.6 mmol/L (ref 0.5–1.9)
Lactic Acid, Venous: 3.2 mmol/L (ref 0.5–1.9)

## 2020-11-16 LAB — COMPREHENSIVE METABOLIC PANEL
ALT: 16 U/L (ref 0–44)
AST: 23 U/L (ref 15–41)
Albumin: 3.4 g/dL — ABNORMAL LOW (ref 3.5–5.0)
Alkaline Phosphatase: 42 U/L (ref 38–126)
Anion gap: 11 (ref 5–15)
BUN: 22 mg/dL (ref 8–23)
CO2: 31 mmol/L (ref 22–32)
Calcium: 9.1 mg/dL (ref 8.9–10.3)
Chloride: 97 mmol/L — ABNORMAL LOW (ref 98–111)
Creatinine, Ser: 1.29 mg/dL — ABNORMAL HIGH (ref 0.44–1.00)
GFR, Estimated: 43 mL/min — ABNORMAL LOW (ref >60)
Glucose, Bld: 142 mg/dL — ABNORMAL HIGH (ref 70–99)
Potassium: 3.3 mmol/L — ABNORMAL LOW (ref 3.5–5.1)
Sodium: 139 mmol/L (ref 135–145)
Total Bilirubin: 1.4 mg/dL — ABNORMAL HIGH (ref 0.3–1.2)
Total Protein: 7.2 g/dL (ref 6.5–8.1)

## 2020-11-16 LAB — MRSA NEXT GEN BY PCR, NASAL: MRSA by PCR Next Gen: NOT DETECTED

## 2020-11-16 LAB — BLOOD GAS, VENOUS
Acid-Base Excess: 4.5 mmol/L — ABNORMAL HIGH (ref 0.0–2.0)
Bicarbonate: 29.2 mmol/L — ABNORMAL HIGH (ref 20.0–28.0)
O2 Saturation: 98.9 %
Patient temperature: 37
pCO2, Ven: 43 mmHg — ABNORMAL LOW (ref 44.0–60.0)
pH, Ven: 7.44 — ABNORMAL HIGH (ref 7.250–7.430)
pO2, Ven: 123 mmHg — ABNORMAL HIGH (ref 32.0–45.0)

## 2020-11-16 LAB — GLUCOSE, CAPILLARY
Glucose-Capillary: 184 mg/dL — ABNORMAL HIGH (ref 70–99)
Glucose-Capillary: 184 mg/dL — ABNORMAL HIGH (ref 70–99)

## 2020-11-16 LAB — TROPONIN I (HIGH SENSITIVITY)
Troponin I (High Sensitivity): 9 ng/L (ref <18)
Troponin I (High Sensitivity): 10 ng/L (ref <18)

## 2020-11-16 LAB — BRAIN NATRIURETIC PEPTIDE: B Natriuretic Peptide: 322.1 pg/mL — ABNORMAL HIGH (ref 0.0–100.0)

## 2020-11-16 MED ORDER — APIXABAN 5 MG PO TABS
5.0000 mg | ORAL_TABLET | Freq: Two times a day (BID) | ORAL | Status: DC
  Administered 2020-11-16 – 2020-11-18 (×4): 5 mg via ORAL
  Filled 2020-11-16: qty 1
  Filled 2020-11-16 (×2): qty 2
  Filled 2020-11-16: qty 1

## 2020-11-16 MED ORDER — AZITHROMYCIN 500 MG IV SOLR: 500.0000 mg | INTRAVENOUS | Status: DC

## 2020-11-16 MED ORDER — SODIUM CHLORIDE 0.9 % IV SOLN
2.0000 g | Freq: Once | INTRAVENOUS | Status: AC
  Administered 2020-11-16: 2 g via INTRAVENOUS
  Filled 2020-11-16: qty 20

## 2020-11-16 MED ORDER — OXYCODONE HCL 5 MG PO TABS
5.0000 mg | ORAL_TABLET | ORAL | Status: DC | PRN
Start: 2020-11-16 — End: 2020-11-18

## 2020-11-16 MED ORDER — TRAZODONE HCL 50 MG PO TABS
50.0000 mg | ORAL_TABLET | Freq: Every day | ORAL | Status: DC
  Administered 2020-11-16 – 2020-11-17 (×2): 50 mg via ORAL
  Filled 2020-11-16 (×2): qty 1

## 2020-11-16 MED ORDER — SODIUM CHLORIDE 0.9% FLUSH
3.0000 mL | Freq: Two times a day (BID) | INTRAVENOUS | Status: DC
  Administered 2020-11-16 – 2020-11-18 (×5): 3 mL via INTRAVENOUS

## 2020-11-16 MED ORDER — IPRATROPIUM-ALBUTEROL 0.5-2.5 (3) MG/3ML IN SOLN
3.0000 mL | Freq: Once | RESPIRATORY_TRACT | Status: AC
  Administered 2020-11-16: 3 mL via RESPIRATORY_TRACT
  Filled 2020-11-16: qty 9

## 2020-11-16 MED ORDER — DILTIAZEM HCL ER COATED BEADS 300 MG PO CP24
300.0000 mg | ORAL_CAPSULE | Freq: Every day | ORAL | Status: DC
  Administered 2020-11-17 – 2020-11-18 (×2): 300 mg via ORAL
  Filled 2020-11-16 (×2): qty 1

## 2020-11-16 MED ORDER — IPRATROPIUM-ALBUTEROL 0.5-2.5 (3) MG/3ML IN SOLN
3.0000 mL | Freq: Once | RESPIRATORY_TRACT | Status: AC
  Administered 2020-11-16: 3 mL via RESPIRATORY_TRACT

## 2020-11-16 MED ORDER — FUROSEMIDE 40 MG PO TABS
40.0000 mg | ORAL_TABLET | Freq: Every day | ORAL | Status: DC
  Administered 2020-11-17 – 2020-11-18 (×2): 40 mg via ORAL
  Filled 2020-11-16: qty 2
  Filled 2020-11-16: qty 1

## 2020-11-16 MED ORDER — SODIUM CHLORIDE 0.9 % IV BOLUS: 1000.0000 mL | Freq: Once | INTRAVENOUS | Status: DC

## 2020-11-16 MED ORDER — ONDANSETRON HCL 4 MG PO TABS: 4.0000 mg | ORAL_TABLET | Freq: Four times a day (QID) | ORAL | Status: DC | PRN

## 2020-11-16 MED ORDER — BISOPROLOL FUMARATE 5 MG PO TABS
2.5000 mg | ORAL_TABLET | Freq: Two times a day (BID) | ORAL | Status: DC
  Administered 2020-11-16 – 2020-11-18 (×4): 2.5 mg via ORAL
  Filled 2020-11-16 (×7): qty 0.5

## 2020-11-16 MED ORDER — CITALOPRAM HYDROBROMIDE 20 MG PO TABS
40.0000 mg | ORAL_TABLET | Freq: Every day | ORAL | Status: DC
  Administered 2020-11-16 – 2020-11-17 (×2): 40 mg via ORAL
  Filled 2020-11-16 (×2): qty 2

## 2020-11-16 MED ORDER — ACETAMINOPHEN 650 MG RE SUPP: 650.0000 mg | Freq: Four times a day (QID) | RECTAL | Status: DC | PRN

## 2020-11-16 MED ORDER — TRAZODONE HCL 50 MG PO TABS: 50.0000 mg | ORAL_TABLET | Freq: Every evening | ORAL | Status: DC | PRN

## 2020-11-16 MED ORDER — LORAZEPAM 0.5 MG PO TABS
0.5000 mg | ORAL_TABLET | Freq: Two times a day (BID) | ORAL | Status: DC
  Administered 2020-11-16 – 2020-11-18 (×4): 0.5 mg via ORAL
  Filled 2020-11-16 (×4): qty 1

## 2020-11-16 MED ORDER — POTASSIUM CHLORIDE CRYS ER 20 MEQ PO TBCR
40.0000 meq | EXTENDED_RELEASE_TABLET | Freq: Once | ORAL | Status: AC
  Administered 2020-11-16: 40 meq via ORAL
  Filled 2020-11-16: qty 2

## 2020-11-16 MED ORDER — INSULIN ASPART 100 UNIT/ML IJ SOLN
0.0000 [IU] | Freq: Three times a day (TID) | INTRAMUSCULAR | Status: DC
  Administered 2020-11-17 – 2020-11-18 (×2): 1 [IU] via SUBCUTANEOUS
  Filled 2020-11-16 (×3): qty 1

## 2020-11-16 MED ORDER — DILTIAZEM HCL 25 MG/5ML IV SOLN
10.0000 mg | Freq: Once | INTRAVENOUS | Status: AC
  Administered 2020-11-16: 10 mg via INTRAVENOUS
  Filled 2020-11-16: qty 5

## 2020-11-16 MED ORDER — SODIUM CHLORIDE 0.9 % IV BOLUS
500.0000 mL | Freq: Once | INTRAVENOUS | Status: AC
  Administered 2020-11-16: 500 mL via INTRAVENOUS

## 2020-11-16 MED ORDER — MAGNESIUM SULFATE 2 GM/50ML IV SOLN: 2.0000 g | Freq: Once | INTRAVENOUS | Status: DC

## 2020-11-16 MED ORDER — LEVOTHYROXINE SODIUM 100 MCG PO TABS
200.0000 ug | ORAL_TABLET | Freq: Every day | ORAL | Status: DC
  Administered 2020-11-17 – 2020-11-18 (×2): 200 ug via ORAL
  Filled 2020-11-16 (×2): qty 2

## 2020-11-16 MED ORDER — METFORMIN HCL 500 MG PO TABS
500.0000 mg | ORAL_TABLET | Freq: Two times a day (BID) | ORAL | Status: DC
  Administered 2020-11-16: 500 mg via ORAL
  Filled 2020-11-16: qty 1

## 2020-11-16 MED ORDER — METHYLPREDNISOLONE SODIUM SUCC 125 MG IJ SOLR
125.0000 mg | Freq: Once | INTRAMUSCULAR | Status: AC
  Administered 2020-11-16: 125 mg via INTRAVENOUS
  Filled 2020-11-16: qty 2

## 2020-11-16 MED ORDER — SODIUM CHLORIDE 0.9 % IV SOLN
500.0000 mg | Freq: Once | INTRAVENOUS | Status: AC
  Administered 2020-11-16: 500 mg via INTRAVENOUS
  Filled 2020-11-16: qty 500

## 2020-11-16 MED ORDER — BUSPIRONE HCL 15 MG PO TABS
15.0000 mg | ORAL_TABLET | Freq: Two times a day (BID) | ORAL | Status: DC
  Administered 2020-11-16 – 2020-11-18 (×4): 15 mg via ORAL
  Filled 2020-11-16 (×6): qty 1

## 2020-11-16 MED ORDER — SODIUM CHLORIDE 0.9 % IV SOLN
100.0000 mg | Freq: Two times a day (BID) | INTRAVENOUS | Status: DC
  Administered 2020-11-16 – 2020-11-18 (×4): 100 mg via INTRAVENOUS
  Filled 2020-11-16 (×6): qty 100

## 2020-11-16 MED ORDER — FAMOTIDINE 20 MG PO TABS
20.0000 mg | ORAL_TABLET | Freq: Every day | ORAL | Status: DC
  Administered 2020-11-17 – 2020-11-18 (×2): 20 mg via ORAL
  Filled 2020-11-16 (×2): qty 1

## 2020-11-16 MED ORDER — PREDNISONE 20 MG PO TABS
40.0000 mg | ORAL_TABLET | Freq: Every day | ORAL | Status: DC
  Administered 2020-11-17: 40 mg via ORAL
  Filled 2020-11-16: qty 2

## 2020-11-16 MED ORDER — IPRATROPIUM-ALBUTEROL 0.5-2.5 (3) MG/3ML IN SOLN
3.0000 mL | Freq: Four times a day (QID) | RESPIRATORY_TRACT | Status: DC
  Administered 2020-11-16 – 2020-11-18 (×7): 3 mL via RESPIRATORY_TRACT
  Filled 2020-11-16 (×8): qty 3

## 2020-11-16 MED ORDER — CEFTRIAXONE SODIUM 2 G IJ SOLR
2.0000 g | INTRAMUSCULAR | Status: DC
  Administered 2020-11-17 – 2020-11-18 (×2): 2 g via INTRAVENOUS
  Filled 2020-11-16 (×2): qty 2
  Filled 2020-11-16: qty 20

## 2020-11-16 MED ORDER — MAGNESIUM SULFATE 2 GM/50ML IV SOLN
2.0000 g | Freq: Once | INTRAVENOUS | Status: AC
  Administered 2020-11-16: 2 g via INTRAVENOUS
  Filled 2020-11-16: qty 50

## 2020-11-16 MED ORDER — LACTATED RINGERS IV BOLUS: 1000.0000 mL | Freq: Once | INTRAVENOUS | Status: DC

## 2020-11-16 MED ORDER — POTASSIUM CHLORIDE CRYS ER 20 MEQ PO TBCR
40.0000 meq | EXTENDED_RELEASE_TABLET | Freq: Two times a day (BID) | ORAL | Status: DC
  Administered 2020-11-16 – 2020-11-18 (×4): 40 meq via ORAL
  Filled 2020-11-16 (×4): qty 2

## 2020-11-16 MED ORDER — ALBUTEROL SULFATE (2.5 MG/3ML) 0.083% IN NEBU
2.5000 mg | INHALATION_SOLUTION | Freq: Once | RESPIRATORY_TRACT | Status: AC
  Administered 2020-11-16: 2.5 mg via RESPIRATORY_TRACT
  Filled 2020-11-16: qty 3

## 2020-11-16 MED ORDER — QUETIAPINE FUMARATE 25 MG PO TABS: 25.0000 mg | ORAL_TABLET | Freq: Every evening | ORAL | Status: DC | PRN

## 2020-11-16 MED ORDER — CHLORHEXIDINE GLUCONATE CLOTH 2 % EX PADS
6.0000 | MEDICATED_PAD | Freq: Every day | CUTANEOUS | Status: DC
  Administered 2020-11-16 – 2020-11-18 (×3): 6 via TOPICAL

## 2020-11-16 MED ORDER — ACETAMINOPHEN 325 MG PO TABS: 650.0000 mg | ORAL_TABLET | Freq: Four times a day (QID) | ORAL | Status: DC | PRN

## 2020-11-16 MED ORDER — LACTATED RINGERS IV BOLUS
1000.0000 mL | Freq: Once | INTRAVENOUS | Status: AC
  Administered 2020-11-16: 1000 mL via INTRAVENOUS

## 2020-11-16 MED ORDER — POLYETHYLENE GLYCOL 3350 17 G PO PACK
17.0000 g | PACK | Freq: Every day | ORAL | Status: DC | PRN
  Administered 2020-11-18: 04:00:00 17 g via ORAL
  Filled 2020-11-16: qty 1

## 2020-11-16 MED ORDER — ONDANSETRON HCL 4 MG/2ML IJ SOLN
4.0000 mg | Freq: Four times a day (QID) | INTRAMUSCULAR | Status: DC | PRN
  Filled 2020-11-16: qty 2

## 2020-11-16 NOTE — ED Triage Notes (Signed)
Difficulty breathing x 2 days, congestion, edema in extremities hx of afib, COPD
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
## 2020-11-16 NOTE — ED Notes (Signed)
Pt Lactic acid level 2.5 from lab. MD made aware at this time

## 2020-11-17 ENCOUNTER — Encounter: Payer: Self-pay | Admitting: Family Medicine

## 2020-11-17 DIAGNOSIS — J9621 Acute and chronic respiratory failure with hypoxia: Secondary | ICD-10-CM

## 2020-11-17 DIAGNOSIS — E039 Hypothyroidism, unspecified: Secondary | ICD-10-CM

## 2020-11-17 DIAGNOSIS — J189 Pneumonia, unspecified organism: Secondary | ICD-10-CM

## 2020-11-17 DIAGNOSIS — R531 Weakness: Secondary | ICD-10-CM

## 2020-11-17 LAB — CBC
HCT: 38.6 % (ref 36.0–46.0)
Hemoglobin: 11.5 g/dL — ABNORMAL LOW (ref 12.0–15.0)
MCH: 23.1 pg — ABNORMAL LOW (ref 26.0–34.0)
MCHC: 29.8 g/dL — ABNORMAL LOW (ref 30.0–36.0)
MCV: 77.7 fL — ABNORMAL LOW (ref 80.0–100.0)
Platelets: 450 10*3/uL — ABNORMAL HIGH (ref 150–400)
RBC: 4.97 MIL/uL (ref 3.87–5.11)
RDW: 19.7 % — ABNORMAL HIGH (ref 11.5–15.5)
WBC: 11.8 10*3/uL — ABNORMAL HIGH (ref 4.0–10.5)
nRBC: 0 % (ref 0.0–0.2)

## 2020-11-17 LAB — COMPREHENSIVE METABOLIC PANEL
ALT: 16 U/L (ref 0–44)
AST: 21 U/L (ref 15–41)
Albumin: 3 g/dL — ABNORMAL LOW (ref 3.5–5.0)
Alkaline Phosphatase: 41 U/L (ref 38–126)
Anion gap: 7 (ref 5–15)
BUN: 24 mg/dL — ABNORMAL HIGH (ref 8–23)
CO2: 31 mmol/L (ref 22–32)
Calcium: 8.7 mg/dL — ABNORMAL LOW (ref 8.9–10.3)
Chloride: 102 mmol/L (ref 98–111)
Creatinine, Ser: 0.98 mg/dL (ref 0.44–1.00)
GFR, Estimated: >60 mL/min (ref >60)
Glucose, Bld: 165 mg/dL — ABNORMAL HIGH (ref 70–99)
Potassium: 3.7 mmol/L (ref 3.5–5.1)
Sodium: 140 mmol/L (ref 135–145)
Total Bilirubin: 1.1 mg/dL (ref 0.3–1.2)
Total Protein: 6.9 g/dL (ref 6.5–8.1)

## 2020-11-17 LAB — GLUCOSE, CAPILLARY
Glucose-Capillary: 142 mg/dL — ABNORMAL HIGH (ref 70–99)
Glucose-Capillary: 169 mg/dL — ABNORMAL HIGH (ref 70–99)
Glucose-Capillary: 156 mg/dL — ABNORMAL HIGH (ref 70–99)
Glucose-Capillary: 146 mg/dL — ABNORMAL HIGH (ref 70–99)
Glucose-Capillary: 145 mg/dL — ABNORMAL HIGH (ref 70–99)

## 2020-11-17 LAB — STREP PNEUMONIAE URINARY ANTIGEN: Strep Pneumo Urinary Antigen: NEGATIVE

## 2020-11-17 MED ORDER — ALBUTEROL SULFATE (2.5 MG/3ML) 0.083% IN NEBU: 2.5000 mg | INHALATION_SOLUTION | RESPIRATORY_TRACT | Status: DC | PRN

## 2020-11-17 MED ORDER — MOMETASONE FURO-FORMOTEROL FUM 200-5 MCG/ACT IN AERO
2.0000 | INHALATION_SPRAY | Freq: Two times a day (BID) | RESPIRATORY_TRACT | Status: DC
  Administered 2020-11-17 – 2020-11-18 (×3): 2 via RESPIRATORY_TRACT
  Filled 2020-11-17: qty 8.8

## 2020-11-17 MED ORDER — LORAZEPAM 2 MG/ML IJ SOLN
0.5000 mg | Freq: Once | INTRAMUSCULAR | Status: AC
  Administered 2020-11-17: 0.5 mg via INTRAVENOUS
  Filled 2020-11-17: qty 1

## 2020-11-17 MED ORDER — METHYLPREDNISOLONE SODIUM SUCC 40 MG IJ SOLR
40.0000 mg | Freq: Two times a day (BID) | INTRAMUSCULAR | Status: DC
  Administered 2020-11-17 – 2020-11-18 (×2): 40 mg via INTRAVENOUS
  Filled 2020-11-17 (×2): qty 1

## 2020-11-17 NOTE — Evaluation (Signed)
Physical Therapy Evaluation Patient Details Name: Summer Bradley MRN: [id number] DOB: 07/26/1945 Today's Date: 11/17/2020  History of Present Illness  Pt is a 75 y/o F admitted on 11/16/20 after presenting with c/o SOB, worsening over the course of a week. Pt has been admitted to [facility] 3x this past year with COPD exacerbations. Chest x-ray showed lower lung interstitial opacities versus edema. Pt is being treated for Acute on chronic hypoxemic respiratory failure.  PMH: a-fib, COPD, CHF with preserved EF, pulmonary HTN, DM, HTN, HLD  Clinical Impression  Pt seen for PT evaluation with family present for session. Pt on 3L/min via nasal cannula throughout session with 1 pulse ox reading as low as 50s but other reading >90%. Nurse replaced pulse ox for improved reading. Otherwise, lowest SPO2 during session was 88% after transferring to recliner. PT & RN provided max cuing/demo for pursed lip breathing but poor return demo from pt.  Pt very anxious throughout session & internally distracted. Pt requires min assist for bed mobility, transfers, & short distance gait in room with RW. Educated pt & family on recommendation of supervision upon d/c. Will continue to follow pt acutely to progress endurance, gait with LRAD, balance, & safety awareness.     Recommendations for follow up therapy are one component of a multi-disciplinary discharge planning process, led by the attending physician.  Recommendations may be updated based on patient status, additional functional criteria and insurance authorization.  Follow Up Recommendations Home health PT;Supervision/Assistance - 24 hour    Equipment Recommendations  None recommended by PT (pt reports she already has a RW)    Recommendations for Other Services       Precautions / Restrictions Precautions Precautions: Fall Restrictions Weight Bearing Restrictions: No      Mobility  Bed Mobility Overal bed mobility: Needs Assistance Bed Mobility:  Supine to Sit     Supine to sit: Min assist;HOB elevated     General bed mobility comments: assistance to upright trunk    Transfers Overall transfer level: Needs assistance Equipment used: Rolling walker (2 wheeled) Transfers: Sit to/from [facility] Sit to Stand: Min assist Stand pivot transfers: Min assist       General transfer comment: poor awareness of safe hand placement to push to standing with RW  Ambulation/Gait Ambulation/Gait assistance: Min assist Gait Distance (Feet): 10 Feet Assistive device: Rolling walker (2 wheeled) Gait Pattern/deviations: Decreased step length - right;Decreased step length - left;Decreased stride length Gait velocity: decreased   General Gait Details: decreased gait speed, cuing to turn around & ambulate back to recliner as pt attempting to ambulate between small space between bed & counter  Stairs            Wheelchair Mobility    Modified Rankin (Stroke Patients Only)       Balance Overall balance assessment: Needs assistance Sitting-balance support: Feet supported;Bilateral upper extremity supported Sitting balance-Leahy Scale: Good     Standing balance support: During functional activity;Bilateral upper extremity supported Standing balance-Leahy Scale: Fair Standing balance comment: BUE support on RW                             Pertinent Vitals/Pain Pain Assessment: No/denies pain    Home Living Family/patient expects to be discharged to:: Private residence Living Arrangements: Alone Available Help at Discharge: Family;Personal care attendant Type of Home: Apartment Home Access: Elevator     Home Layout: One level  Prior Function Level of Independence: Needs assistance         Comments: Pt reports she intermittently ambulates with RW at home, otherwise doesn't use AD, 1-2 falls in the past 6 months. Goes to [facility] 5 days/[facility] & PCA assists her with dressing & getting  in/out of bed every morning & night.     Hand Dominance        Extremity/Trunk Assessment   Upper Extremity Assessment Upper Extremity Assessment: Generalized weakness    Lower Extremity Assessment Lower Extremity Assessment: Generalized weakness       Communication   Communication:  (slightly HOH)  Cognition Arousal/Alertness: Awake/alert Behavior During Therapy: Anxious Overall Cognitive Status: Difficult to assess                                 General Comments: Pt very anxious throughout session, requiring cuing for sustained attention to task, asking "is that blood on the wall?" across the room.      General Comments General comments (skin integrity, edema, etc.): Pt's RUE IV pulled out during session & pt bleeding with PT using washcloth to provide compression until RN came into room to assess & dress site. Assisted pt with changing into a clean gown.    Exercises     Assessment/Plan    PT Assessment Patient needs continued PT services  PT Problem List Decreased strength;Decreased mobility;Decreased safety awareness;Decreased knowledge of precautions;Decreased activity tolerance;Decreased balance;Cardiopulmonary status limiting activity;Decreased knowledge of use of DME       PT Treatment Interventions DME instruction;Therapeutic exercise;Gait training;Balance training;Stair training;Neuromuscular re-education;Functional mobility training;Cognitive remediation;Therapeutic activities;Patient/family education    PT Goals (Current goals can be found in the Care Plan section)  Acute Rehab PT Goals Patient Stated Goal: get better PT Goal Formulation: With patient/family Time For Goal Achievement: 12/01/20 Potential to Achieve Goals: Good    Frequency Min 2X/week   Barriers to discharge Decreased caregiver support      Co-evaluation               AM-PAC PT "6 Clicks" Mobility  Outcome Measure Help needed turning from your back to your  side while in a flat bed without using bedrails?: A Little Help needed moving from lying on your back to sitting on the side of a flat bed without using bedrails?: A Little Help needed moving to and from a bed to a chair (including a wheelchair)?: A Little Help needed standing up from a chair using your arms (e.g., wheelchair or bedside chair)?: A Little Help needed to walk in hospital room?: A Little Help needed climbing 3-5 steps with a railing? : A Lot 6 Click Score: 17    End of Session Equipment Utilized During Treatment: Oxygen Activity Tolerance:  (Pt self limiting, endorses fatigue & is anxious throughout) Patient left: in chair;with chair alarm set;with call bell/phone within reach;with family/visitor present;with nursing/sitter in room Nurse Communication: Mobility status (O2) PT Visit Diagnosis: Unsteadiness on feet (R26.81);History of falling (Z91.81);Muscle weakness (generalized) (M62.81)    Time: 4782-9562 PT Time Calculation (min) (ACUTE ONLY): 32 min   Charges:   PT Evaluation $PT Eval Moderate Complexity: 1 Mod PT Treatments $Therapeutic Activity: 8-22 mins        Lavone Nian, PT, DPT 11/17/20, 2:55 PM   Waunita Schooner 11/17/2020, 2:53 PM

## 2020-11-17 NOTE — Progress Notes (Signed)
Patient ID: Summer Bradley, female   DOB: December 02, 1945, 75 y.o.   MRN: [id number] [facility] PROGRESS NOTE  Shaconda Hajduk ERX:[id number] DOB: 1945-12-03 DOA: 11/16/2020 PCP: [place]  HPI/Subjective: Patient asked me how long she needs to stay in the hospital.  Then she states that she is short of breath.  Some cough.  No nausea or vomiting.  She states that she did not get along with the nursing staff this morning.  In speaking with the patient's daughter, she is very demanding.  Patient did not want to use the BiPAP last night.  Objective: Vitals:   11/17/20 1200 11/17/20 1339  BP: 130/77 107/68  Pulse: 76 (!) 113  Resp: (!) 22 20  Temp: 98.4 F (36.9 C) 98.6 F (37 C)  SpO2: 99% 97%    Intake/Output Summary (Last 24 hours) at 11/17/2020 1356 Last data filed at 11/17/2020 1221 Gross per 24 hour  Intake 1093.72 ml  Output 575 ml  Net 518.72 ml   Filed Weights   11/16/20 1156 11/16/20 1819  Weight: 81.6 kg 84.6 kg    ROS: Review of Systems  Respiratory:  Negative for shortness of breath.   Cardiovascular:  Negative for chest pain.  Gastrointestinal:  Negative for abdominal pain, nausea and vomiting.  Exam: Physical Exam HENT:     Head: Normocephalic.     Mouth/Throat:     Pharynx: No oropharyngeal exudate.  Eyes:     General: Lids are normal.     Conjunctiva/sclera: Conjunctivae normal.  Cardiovascular:     Rate and Rhythm: Normal rate and regular rhythm.     Heart sounds: Normal heart sounds, S1 normal and S2 normal.  Pulmonary:     Breath sounds: Examination of the right-middle field reveals decreased breath sounds and wheezing. Examination of the left-middle field reveals decreased breath sounds and wheezing. Examination of the right-lower field reveals decreased breath sounds and rhonchi. Examination of the left-lower field reveals decreased breath sounds and rhonchi. Decreased breath sounds, wheezing and rhonchi present. No rales.   Abdominal:     Palpations: Abdomen is soft.     Tenderness: There is no abdominal tenderness.  Musculoskeletal:     Right ankle: Swelling present.     Left ankle: Swelling present.  Skin:    General: Skin is warm.     Findings: No rash.  Neurological:     Mental Status: She is alert and oriented to person, place, and time.      Scheduled Meds:  apixaban  5 mg Oral BID   bisoprolol  2.5 mg Oral BID   busPIRone  15 mg Oral BID   Chlorhexidine Gluconate Cloth  6 each Topical Daily   citalopram  40 mg Oral QHS   diltiazem  300 mg Oral Daily   famotidine  20 mg Oral Daily   furosemide  40 mg Oral Daily   insulin aspart  0-6 Units Subcutaneous TID WC   ipratropium-albuterol  3 mL Nebulization Q6H   levothyroxine  200 mcg Oral Daily   LORazepam  0.5 mg Oral BID   methylPREDNISolone (SOLU-MEDROL) injection  40 mg Intravenous Q12H   mometasone-formoterol  2 puff Inhalation BID   potassium chloride SA  40 mEq Oral BID   sodium chloride flush  3 mL Intravenous Q12H   traZODone  50 mg Oral QHS   Continuous Infusions:  cefTRIAXone (ROCEPHIN)  IV 2 g (11/17/20 1221)   doxycycline (VIBRAMYCIN) IV 100 mg (11/17/20 0606)  Assessment/Plan:  Acute on chronic hypoxic respiratory failure.  Patient on 4 L of oxygen and normally wears 3 at home.  Patient was in respiratory distress and ER physician tried to do BiPAP but patient declined.  Stable to be transferred out of the ICU. COPD exacerbation.  Add Dulera inhaler.  Switch prednisone back to Solu-Medrol 40 mg twice daily.  Continue nebulizers Bilateral lower opacities consistent with pneumonia.  On Rocephin and doxycycline. Hypothyroidism unspecified on levothyroxine Atrial fibrillation with rapid atrial fibrillation on Eliquis for anticoagulation and diltiazem and bisoprolol for rate control. Lower extremity edema on Lasix Anxiety on BuSpar Weakness.  Physical therapy evaluate    Code Status:     Code Status Orders  (From  admission, onward)           Start     Ordered   11/16/20 1541  Full code  Continuous        11/16/20 1544           Code Status History      Family Communication: Spoke with daughter on phone Disposition Plan: Status is: Inpatient  Dispo: The patient is from: Home              Anticipated d/c is to: Home              Patient currently being treated for COPD exacerbation and pneumonia with IV antibiotics and steroids   Difficult to place patient.  No  Antibiotics: Rocephin and doxycycline  Time spent: 27 minutes  [place]

## 2020-11-17 NOTE — Progress Notes (Signed)
Interesting 75 year old. Very nervous and demanding. When she is short of breath her oxygen saturation is 96-99. Wants to be catered to. She has a NT that stays with her all day. Lung sounds are diminished. 1310 Transferred to room 114 via bed with oxygen on 4 L [place].

## 2020-11-18 DIAGNOSIS — F419 Anxiety disorder, unspecified: Secondary | ICD-10-CM

## 2020-11-18 DIAGNOSIS — R609 Edema, unspecified: Secondary | ICD-10-CM

## 2020-11-18 LAB — GLUCOSE, CAPILLARY
Glucose-Capillary: 167 mg/dL — ABNORMAL HIGH (ref 70–99)
Glucose-Capillary: 193 mg/dL — ABNORMAL HIGH (ref 70–99)

## 2020-11-18 LAB — HEMOGLOBIN A1C
Hgb A1c MFr Bld: 6 % — ABNORMAL HIGH (ref 4.8–5.6)
Mean Plasma Glucose: 126 mg/dL

## 2020-11-18 LAB — LEGIONELLA PNEUMOPHILA SEROGP 1 UR AG: L. pneumophila Serogp 1 Ur Ag: NEGATIVE

## 2020-11-18 MED ORDER — DOXYCYCLINE HYCLATE 100 MG PO TABS: 100.0000 mg | ORAL_TABLET | Freq: Two times a day (BID) | ORAL | 0 refills | Status: AC

## 2020-11-18 MED ORDER — DOXYCYCLINE HYCLATE 100 MG PO TABS: 100.0000 mg | ORAL_TABLET | Freq: Two times a day (BID) | ORAL | Status: DC

## 2020-11-18 MED ORDER — PREDNISONE 10 MG PO TABS
ORAL_TABLET | ORAL | 0 refills | Status: DC
Start: 2020-11-18 — End: 2021-02-26

## 2020-11-18 NOTE — Progress Notes (Signed)
   11/18/20 0100  Clinical Encounter Type  Visited With Patient  Visit Type Initial;Spiritual support;Social support  Referral From Nurse  Consult/Referral To Chaplain  Stress Factors  Patient Stress Factors Loss of control;Major life changes;Exhausted;Health changes   Chaplain visited PT to offer support, per request of medical staff. PT is having a  lot of anxiety, and wrestled with thoughts of being a burden, and not being liked. Chaplain normalized her emotions and gave space for expression of emotions. Chaplain ministered with a calm compassionate presence, and with reflective listening.

## 2020-11-18 NOTE — TOC Transition Note (Signed)
Transition of Care [facility]) - CM/SW Discharge Note   Patient Details  Name: Summer Bradley MRN: [id number] Date of Birth: [birth year]  Transition of Care [facility] CM/SW Contact:  Shelbie Hutching, RN Phone Number: 11/18/2020, 2:07 PM   Clinical Narrative:    Patient admitted to the hospital with COPD exacerbation. Patient is on chronic O2 at 3L, very anxious.  [facility].  [facility] will be [facility] today.  She goes to the [facility] 5 days per week and has aids that come out to her home.  Patient lives in an apartment by herself.  Patient's daughter is aware of discharge today.     Final next level of care: [place] ([facility]) Barriers to Discharge: Barriers Resolved   Patient Goals and CMS Choice Patient states their goals for this hospitalization and ongoing recovery are:: Patient wants to go home      Discharge Placement                Patient to be transferred to facility by: [facility] providing transport Name of family member notified: [name] Patient and family notified of of transfer: 11/18/20  Discharge Plan and Services   Discharge Planning Services: CM Consult            DME Arranged: N/A DME Agency: NA       HH Arranged: Nurse's Aide, RN [place] Agency: Other - See comment ([facility]) Date HH Agency Contacted: 11/18/20 Time [place]: [place] ([facility]) Interventions     Readmission Risk Interventions Readmission Risk Prevention Plan 11/18/2020 09/21/2019 09/20/2019  Transportation Screening Complete Complete Complete  PCP or Specialist Appt within 3-5 Days Complete Complete Complete  HRI or Home Care Consult Complete Complete Complete  Social Work Consult for [place] Planning/Counseling Complete - -  Palliative Care Screening Not Applicable Not Applicable Not Applicable  Medication Review Press photographer) Complete Complete Complete  Some recent data might be hidden

## 2020-11-18 NOTE — Progress Notes (Signed)
   11/18/20 0917  Clinical Encounter Type  Visited With Patient  Visit Type Initial  Referral From Nurse  Consult/Referral To Chaplain  Spiritual Encounters  Spiritual Needs Prayer;Emotional  Chaplain Malan Werk completed an OR for room 114A, Ms. Laneta Simmers. Prayer and emotional support.

## 2020-11-18 NOTE — Progress Notes (Signed)
PHARMACIST - PHYSICIAN COMMUNICATION DR:   Leslye Peer CONCERNING: Antibiotic IV to Oral Route Change Policy  RECOMMENDATION: This patient is receiving Doxyxline by the intravenous route.  Based on criteria approved by the Pharmacy and Therapeutics Committee, the antibiotic(s) is/are being converted to the equivalent oral dose form(s).   DESCRIPTION: These criteria include: Patient being treated for a respiratory tract infection, urinary tract infection, cellulitis or clostridium difficile associated diarrhea if on metronidazole The patient is not neutropenic and does not exhibit a GI malabsorption state The patient is eating (either orally or via tube) and/or has been taking other orally administered medications for a least 24 hours The patient is improving clinically and has a Tmax < 100.5  If you have questions about this conversion, please contact the Pharmacy Department  [x]   [phone] )  [place], PharmD, BCPS Clinical Pharmacist

## 2020-11-18 NOTE — Progress Notes (Signed)
Patient in room yelling that she needs help.  Arrived in patient's room and found patient to be in chair with 3L [place] in place.  Patient yells that she cannot breath.  Patient educated to take breaths in through her nose and out slowly through her mouth.  Also assured patient that being able to talk and even yell is a good indicator that her breathing is fine.  Educated patient on relaxation techniques.  Patient states that she is going "to rip all these wires off and leave."  Educated patient that she is able to leave if she wants but that she is here for medical treatment and that leaving the hospital prior to MD decision that she is medically stable for discharge may not be in her best interest.  Patient assured that staff will check on her throughout the day.  Upon leaving room, patient continues to yell out.  Will continue to monitor.

## 2020-11-18 NOTE — Discharge Summary (Signed)
[facility] NAME: Rowen Hur    MR#:  [id number]  DATE OF BIRTH:  06/11/1945  DATE OF ADMISSION:  11/16/2020 ADMITTING PHYSICIAN: Clarnce Flock, MD  DATE OF DISCHARGE: 11/18/2020  3:00 PM  PRIMARY CARE PHYSICIAN: [place]    ADMISSION DIAGNOSIS:  COPD exacerbation ([place]) [J44.1] Acute on chronic respiratory failure with hypoxia ([place]) [J96.21] Acute hypoxemic respiratory failure ([place]) [J96.01] Community acquired pneumonia, unspecified laterality [J18.9]  DISCHARGE DIAGNOSIS:  COPD exacerbation Bilateral lower pneumonia  SECONDARY DIAGNOSIS:   Past Medical History:  Diagnosis Date   A-fib ([place])    Anxiety    COPD (chronic obstructive pulmonary disease) ([place])    Cough    CHONIC   Depression    Dysrhythmia    GERD (gastroesophageal reflux disease)    Heart disease    High cholesterol    Hypertension    Hypothyroidism    Orthopnea    Oxygen dependent    2/L CONTINUOUSLY   Stroke ([place])    Vertigo    Wheezing     HOSPITAL COURSE:   1.  Acute on chronic hypoxic respiratory failure.  The patient was in respiratory distress in the emergency room and the patient declined BiPAP.  Patient was titrated down to her normal 3 L of oxygen with a pulse ox of 98% 2.  COPD exacerbation.  Patient was given Solu-Medrol during the hospital stay.  Given nebulizer treatments.  We will switch over to prednisone and do a taper over time.  Patient will go back on her inhalers and nebulizer treatments at home. 3. Bilateral lower opacities consistent with pneumonia.  Patient was on Rocephin and doxycycline here.  We will switch to doxycycline upon going home. 4.  Hypothyroidism unspecified on levothyroxine 5.  Atrial fibrillation with rapid ventricular response, chronic.  On Eliquis for anticoagulation.  Patient is on diltiazem and bisoprolol for rate control.  Heart rate better controlled once breathing better. 6.   Lower extremity edema on Lasix. 7.  Severe anxiety here in the hospital and just wanted to go home.  She is already on BuSpar and lorazepam. 8.  Weakness.  Physical therapy recommended home health PT [place] supervision 24 hours.  The patient belongs to the pace program and is brought to the pace program 5 days a week. 9.  Prior history of stroke in 2015.  Patient on anticoagulation with Eliquis 10.  Lactic acidosis hold metformin 11.  Type 2 diabetes mellitus.  Sugars will be a little higher with being on prednisone.  Need to hold metformin with lactic acidosis.  DISCHARGE CONDITIONS:   Fair  CONSULTS OBTAINED:  None  DRUG ALLERGIES:   Allergies  Allergen Reactions   Lisinopril Cough    DISCHARGE MEDICATIONS:   Allergies as of 11/18/2020       Reactions   Lisinopril Cough        Medication List     STOP taking these medications    metFORMIN 500 MG tablet Commonly known as: GLUCOPHAGE       TAKE these medications    albuterol 108 (90 Base) MCG/ACT inhaler Commonly known as: VENTOLIN HFA Inhale 2 puffs into the lungs every 4 (four) hours as needed for wheezing or shortness of breath.   albuterol (2.5 MG/3ML) 0.083% nebulizer solution Commonly known as: PROVENTIL Take 2.5 mg by nebulization every 6 (six) hours as needed for wheezing or shortness of breath.   alendronate 70 MG  tablet Commonly known as: FOSAMAX Take 70 mg by mouth once a week.   apixaban 5 MG Tabs tablet Commonly known as: ELIQUIS Take 1 tablet (5 mg total) by mouth 2 (two) times daily.   bisoprolol 5 MG tablet Commonly known as: ZEBETA Take 0.5 tablets (2.5 mg total) by mouth 2 (two) times daily.   Breo Ellipta 200-25 MCG/INH Aepb Generic drug: fluticasone furoate-vilanterol Inhale 1 puff into the lungs daily.   budesonide-formoterol 160-4.5 MCG/ACT inhaler Commonly known as: SYMBICORT Inhale 2 puffs into the lungs 2 (two) times daily.   busPIRone 15 MG tablet Commonly known as:  BUSPAR Take 15 mg by mouth 2 (two) times daily.   Calcium Carbonate-Vitamin D3 600-400 MG-UNIT Tabs Take 1 tablet by mouth 2 (two) times daily.   citalopram 40 MG tablet Commonly known as: CELEXA Take 40 mg by mouth at bedtime.   Combivent Respimat 20-100 MCG/ACT Aers respimat Generic drug: Ipratropium-Albuterol Inhale 2 puffs into the lungs daily.   diltiazem 300 MG 24 hr capsule Commonly known as: CARDIZEM CD Take 1 capsule (300 mg total) by mouth daily.   docusate sodium 100 MG capsule Commonly known as: COLACE Take 100 mg by mouth daily.   doxycycline 100 MG tablet Commonly known as: VIBRA-TABS Take 1 tablet (100 mg total) by mouth every 12 (twelve) hours for 6 days.   EUCERIN EX Apply 1 application topically 2 (two) times daily as needed.   famotidine 20 MG tablet Commonly known as: PEPCID Take 1 tablet (20 mg total) by mouth daily.   furosemide 40 MG tablet Commonly known as: LASIX Take 40 mg by mouth daily.   levothyroxine 200 MCG tablet Commonly known as: SYNTHROID Take 200 mcg by mouth daily.   LORazepam 0.5 MG tablet Commonly known as: ATIVAN Take 0.5 mg by mouth 2 (two) times daily.   metolazone 2.5 MG tablet Commonly known as: ZAROXOLYN Take 2.5 mg by mouth once a week. On Wednesdays with furosemide in the am   polyethylene glycol 17 g packet Commonly known as: MIRALAX / GLYCOLAX Take 17 g by mouth daily as needed.   potassium chloride SA 20 MEQ tablet Commonly known as: KLOR-CON Take 2 tablets (40 mEq total) by mouth 2 (two) times daily.   predniSONE 10 MG tablet Commonly known as: DELTASONE 4 tabs po day 1; 3 tabs po day2,3; 2 tabs po day4,5; 1 tab po day 6,7; 1/2 tab po day8,9 What changed:  medication strength additional instructions   QUEtiapine 25 MG tablet Commonly known as: SEROQUEL Take 1 tablet (25 mg total) by mouth at bedtime as needed.   SYSTANE ULTRA OP Place 1 drop into both eyes every 4 (four) hours as needed (dry  eyes).   Tiotropium Bromide Monohydrate 2.5 MCG/ACT Aers Inhale 1 puff into the lungs daily.   traZODone 50 MG tablet Commonly known as: DESYREL Take 50 mg by mouth at bedtime.         DISCHARGE INSTRUCTIONS:   Follow-up with PMD at the pace program  If you experience worsening of your admission symptoms, develop shortness of breath, life threatening emergency, suicidal or homicidal thoughts you must seek medical attention immediately by calling 911 or calling your MD immediately  if symptoms less severe.  You Must read complete instructions/literature along with all the possible adverse reactions/side effects for all the Medicines you take and that have been prescribed to you. Take any new Medicines after you have completely understood and accept all the possible adverse reactions/side  effects.   Please note  You were cared for by a hospitalist during your hospital stay. If you have any questions about your discharge medications or the care you received while you were in the hospital after you are discharged, you can call the unit and asked to speak with the hospitalist on call if the hospitalist that took care of you is not available. Once you are discharged, your primary care physician will handle any further medical issues. Please note that NO REFILLS for any discharge medications will be authorized once you are discharged, as it is imperative that you return to your primary care physician (or establish a relationship with a primary care physician if you do not have one) for your aftercare needs so that they can reassess your need for medications and monitor your lab values.    Today   CHIEF COMPLAINT:   Chief Complaint  Patient presents with   Shortness of Breath    HISTORY OF PRESENT ILLNESS:  Cady Hafen  is a 75 y.o. female came in with shortness of breath   VITAL SIGNS:  Blood pressure 112/60, pulse 70, temperature 98.1 F (36.7 C), resp. rate 20, height 5\' 2"   (1.575 m), weight 84.6 kg, SpO2 98 %.  I/O:   Intake/Output Summary (Last 24 hours) at 11/18/2020 1842 Last data filed at 11/18/2020 1246 Gross per 24 hour  Intake 350 ml  Output 650 ml  Net -300 ml    PHYSICAL EXAMINATION:  GENERAL:  75 y.o.-year-old patient lying in the bed with no acute distress.  EYES: Pupils equal, round, reactive to light and accommodation. No scleral icterus.  HEENT: Head atraumatic, normocephalic. Oropharynx and nasopharynx clear.  LUNGS: Coarse breath sounds bilateral bases, no wheezing, rales,rhonchi or crepitation. No use of accessory muscles of respiration.  CARDIOVASCULAR: S1, S2 irregular irregular. No murmurs, rubs, or gallops.  ABDOMEN: Soft, non-tender, non-distended.  EXTREMITIES: Trace pedal edema.  NEUROLOGIC: Cranial nerves II through XII are intact. Muscle strength 5/5 in all extremities. Sensation intact. Gait not checked.  PSYCHIATRIC: The patient is alert and oriented x 3.  SKIN: No obvious rash, lesion, or ulcer.   DATA REVIEW:   CBC Recent Labs  Lab 11/17/20 0346  WBC 11.8*  HGB 11.5*  HCT 38.6  PLT 450*    Chemistries  Recent Labs  Lab 11/17/20 0346  NA 140  K 3.7  CL 102  CO2 31  GLUCOSE 165*  BUN 24*  CREATININE 0.98  CALCIUM 8.7*  AST 21  ALT 16  ALKPHOS 41  BILITOT 1.1     Microbiology Results  Results for orders placed or performed during the hospital encounter of 11/16/20  Blood culture (single)     Status: None (Preliminary result)   Collection Time: 11/16/20 12:10 PM   Specimen: BLOOD  Result Value Ref Range Status   Specimen Description BLOOD RIGHT ANTECUBITAL  Final   Special Requests   Final    BOTTLES DRAWN AEROBIC AND ANAEROBIC Blood Culture adequate volume   Culture   Final    NO GROWTH 2 DAYS Performed at [facility], [address], [place], [place] [address]    Report Status PENDING  Incomplete  Resp Panel by RT-PCR (Flu A&B, Covid) Nasopharyngeal Swab     Status: None    Collection Time: 11/16/20 12:10 PM   Specimen: Nasopharyngeal Swab; Nasopharyngeal(NP) swabs in vial transport medium  Result Value Ref Range Status   SARS Coronavirus 2 by RT PCR NEGATIVE NEGATIVE Final  Comment: (NOTE) SARS-CoV-2 target nucleic acids are NOT DETECTED.  The SARS-CoV-2 RNA is generally detectable in upper respiratory specimens during the acute phase of infection. The lowest concentration of SARS-CoV-2 viral copies this assay can detect is 138 copies/mL. A negative result does not preclude SARS-Cov-2 infection and should not be used as the sole basis for treatment or other patient management decisions. A negative result may occur with  improper specimen collection/handling, submission of specimen other than nasopharyngeal swab, presence of viral mutation(s) within the areas targeted by this assay, and inadequate number of viral copies(<138 copies/mL). A negative result must be combined with clinical observations, patient history, and epidemiological information. The expected result is Negative.  Fact Sheet for Patients:  EntrepreneurPulse.com.au  Fact Sheet for Healthcare Providers:  IncredibleEmployment.be  This test is no t yet approved or cleared by the [place] FDA and  has been authorized for detection and/or diagnosis of SARS-CoV-2 by FDA under an Emergency Use Authorization (EUA). This EUA will remain  in effect (meaning this test can be used) for the duration of the COVID-19 declaration under Section 564(b)(1) of the Act, 21 U.S.C.section 360bbb-3(b)(1), unless the authorization is terminated  or revoked sooner.       Influenza A by PCR NEGATIVE NEGATIVE Final   Influenza B by PCR NEGATIVE NEGATIVE Final    Comment: (NOTE) The Xpert Xpress SARS-CoV-2/FLU/RSV plus assay is intended as an aid in the diagnosis of influenza from Nasopharyngeal swab specimens and should not be used as a sole basis for treatment.  Nasal washings and aspirates are unacceptable for Xpert Xpress SARS-CoV-2/FLU/RSV testing.  Fact Sheet for Patients: EntrepreneurPulse.com.au  Fact Sheet for Healthcare Providers: IncredibleEmployment.be  This test is not yet approved or cleared by the [place] FDA and has been authorized for detection and/or diagnosis of SARS-CoV-2 by FDA under an Emergency Use Authorization (EUA). This EUA will remain in effect (meaning this test can be used) for the duration of the COVID-19 declaration under Section 564(b)(1) of the Act, 21 U.S.C. section 360bbb-3(b)(1), unless the authorization is terminated or revoked.  Performed at [facility], [address] [place], [place] [address]   Blood culture (single)     Status: None (Preliminary result)   Collection Time: 11/16/20 12:10 PM   Specimen: BLOOD  Result Value Ref Range Status   Specimen Description BLOOD LEFT ANTECUBITAL  Final   Special Requests   Final    BOTTLES DRAWN AEROBIC AND ANAEROBIC Blood Culture adequate volume   Culture   Final    NO GROWTH 2 DAYS Performed at [facility], [address] [place], [place] [address]    Report Status PENDING  Incomplete  MRSA Next Gen by PCR, Nasal     Status: None   Collection Time: 11/16/20  6:14 PM   Specimen: Nasal Mucosa; Nasal Swab  Result Value Ref Range Status   MRSA by PCR Next Gen NOT DETECTED NOT DETECTED Final    Comment: (NOTE) The GeneXpert MRSA Assay (FDA approved for NASAL specimens only), is one component of a comprehensive MRSA colonization surveillance program. It is not intended to diagnose MRSA infection nor to guide or monitor treatment for MRSA infections. Test performance is not FDA approved in patients less than 72 years old. Performed at [facility], [address] [place], [place] [address]       Management plans discussed with the patient, family and they are in  agreement.  CODE STATUS:     Code  Status Orders  (From admission, onward)           Start     Ordered   11/16/20 1541  Full code  Continuous        11/16/20 1544           Code Status History     Date Active Date Inactive Code Status Order ID Comments User Context   09/19/2019 1540 09/21/2019 1616 DNR [id number]  Ivor Costa, MD ED   08/14/2019 0007 08/17/2019 1856 DNR [id number]  Lenore Cordia, MD ED   04/28/2019 1516 04/29/2019 1937 DNR [id number]  Lorella Nimrod, MD ED   04/28/2019 1516 04/28/2019 1516 DNR [id number]  Lorella Nimrod, MD ED   04/28/2019 1515 04/28/2019 1516 Full Code [id number]  Lorella Nimrod, MD ED   12/28/2018 1934 01/03/2019 1857 DNR [id number]  Awilda Bill, NP Inpatient   12/23/2018 2233 12/28/2018 1934 Full Code [id number]  Mansy, Arvella Merles, MD ED   02/03/2018 0934 02/10/2018 1954 Full Code [id number]  Arta Silence, MD Inpatient   02/02/2014 0933 03/05/2014 1624 Full Code [id number]  Wilhelmina Mcardle, MD Inpatient   02/01/2014 1236 02/02/2014 0933 Full Code [id number]  Rob Hickman, MD Inpatient   01/29/2014 1119 02/01/2014 1236 Full Code [id number]  Roland Rack, MD Inpatient       TOTAL TIME TAKING CARE OF THIS PATIENT: 32 minutes.    Loletha Grayer M.D on 11/18/2020 at 6:42 PM   [facility]  CC: Primary care physician; [place]

## 2020-11-21 LAB — CULTURE, BLOOD (SINGLE)
Culture: NO GROWTH
Culture: NO GROWTH
Special Requests: ADEQUATE
Special Requests: ADEQUATE

## 2020-12-18 ENCOUNTER — Emergency Department: Payer: Medicare (Managed Care)

## 2020-12-18 ENCOUNTER — Other Ambulatory Visit: Payer: Self-pay

## 2020-12-18 ENCOUNTER — Emergency Department
Admission: EM | Admit: 2020-12-18 | Discharge: 2020-12-19 | Disposition: A | Discharge status: Home / Self Care | Payer: Medicare (Managed Care) | Attending: Emergency Medicine | Admitting: Emergency Medicine

## 2020-12-18 DIAGNOSIS — S51812A Laceration without foreign body of left forearm, initial encounter: Secondary | ICD-10-CM | POA: Diagnosis not present

## 2020-12-18 DIAGNOSIS — Z20822 Contact with and (suspected) exposure to covid-19: Secondary | ICD-10-CM | POA: Insufficient documentation

## 2020-12-18 DIAGNOSIS — I10 Essential (primary) hypertension: Secondary | ICD-10-CM | POA: Diagnosis not present

## 2020-12-18 DIAGNOSIS — Z7901 Long term (current) use of anticoagulants: Secondary | ICD-10-CM | POA: Insufficient documentation

## 2020-12-18 DIAGNOSIS — E039 Hypothyroidism, unspecified: Secondary | ICD-10-CM | POA: Insufficient documentation

## 2020-12-18 DIAGNOSIS — W19XXXA Unspecified fall, initial encounter: Secondary | ICD-10-CM | POA: Insufficient documentation

## 2020-12-18 DIAGNOSIS — Z79899 Other long term (current) drug therapy: Secondary | ICD-10-CM | POA: Diagnosis not present

## 2020-12-18 DIAGNOSIS — E119 Type 2 diabetes mellitus without complications: Secondary | ICD-10-CM | POA: Insufficient documentation

## 2020-12-18 DIAGNOSIS — S51811A Laceration without foreign body of right forearm, initial encounter: Secondary | ICD-10-CM | POA: Diagnosis not present

## 2020-12-18 DIAGNOSIS — Z87891 Personal history of nicotine dependence: Secondary | ICD-10-CM | POA: Diagnosis not present

## 2020-12-18 DIAGNOSIS — I480 Paroxysmal atrial fibrillation: Secondary | ICD-10-CM | POA: Insufficient documentation

## 2020-12-18 DIAGNOSIS — S0990XA Unspecified injury of head, initial encounter: Secondary | ICD-10-CM | POA: Insufficient documentation

## 2020-12-18 DIAGNOSIS — Z7951 Long term (current) use of inhaled steroids: Secondary | ICD-10-CM | POA: Insufficient documentation

## 2020-12-18 DIAGNOSIS — S8992XA Unspecified injury of left lower leg, initial encounter: Secondary | ICD-10-CM | POA: Diagnosis present

## 2020-12-18 DIAGNOSIS — S81012A Laceration without foreign body, left knee, initial encounter: Secondary | ICD-10-CM | POA: Diagnosis not present

## 2020-12-18 DIAGNOSIS — E876 Hypokalemia: Secondary | ICD-10-CM | POA: Insufficient documentation

## 2020-12-18 DIAGNOSIS — J441 Chronic obstructive pulmonary disease with (acute) exacerbation: Secondary | ICD-10-CM | POA: Insufficient documentation

## 2020-12-18 MED ORDER — DOCUSATE SODIUM 100 MG PO CAPS
100.0000 mg | ORAL_CAPSULE | Freq: Every day | ORAL | Status: DC
  Administered 2020-12-19: 100 mg via ORAL
  Filled 2020-12-18: qty 1

## 2020-12-18 MED ORDER — APIXABAN 5 MG PO TABS
5.0000 mg | ORAL_TABLET | Freq: Two times a day (BID) | ORAL | Status: DC
  Administered 2020-12-19 (×2): 5 mg via ORAL
  Filled 2020-12-18 (×2): qty 1

## 2020-12-18 MED ORDER — ALBUTEROL SULFATE HFA 108 (90 BASE) MCG/ACT IN AERS: 2.0000 | INHALATION_SPRAY | RESPIRATORY_TRACT | Status: DC | PRN

## 2020-12-18 MED ORDER — LIDOCAINE-EPINEPHRINE (PF) 2 %-1:200000 IJ SOLN
20.0000 mL | Freq: Once | INTRAMUSCULAR | Status: AC
  Administered 2020-12-18: 20 mL via INTRADERMAL
  Filled 2020-12-18: qty 20

## 2020-12-18 MED ORDER — ALBUTEROL SULFATE (2.5 MG/3ML) 0.083% IN NEBU: 2.5000 mg | INHALATION_SOLUTION | Freq: Four times a day (QID) | RESPIRATORY_TRACT | Status: DC | PRN

## 2020-12-18 MED ORDER — BISOPROLOL FUMARATE 5 MG PO TABS
2.5000 mg | ORAL_TABLET | Freq: Two times a day (BID) | ORAL | Status: DC
  Administered 2020-12-19 (×2): 2.5 mg via ORAL
  Filled 2020-12-18 (×3): qty 0.5

## 2020-12-18 MED ORDER — TIOTROPIUM BROMIDE MONOHYDRATE 18 MCG IN CAPS
1.0000 | ORAL_CAPSULE | Freq: Every day | RESPIRATORY_TRACT | Status: DC
  Administered 2020-12-19: 18 ug via RESPIRATORY_TRACT
  Filled 2020-12-18: qty 5

## 2020-12-18 MED ORDER — FAMOTIDINE 20 MG PO TABS
20.0000 mg | ORAL_TABLET | Freq: Every day | ORAL | Status: DC
  Administered 2020-12-19: 20 mg via ORAL
  Filled 2020-12-18: qty 1

## 2020-12-18 MED ORDER — BUSPIRONE HCL 5 MG PO TABS
15.0000 mg | ORAL_TABLET | Freq: Two times a day (BID) | ORAL | Status: DC
  Administered 2020-12-19 (×2): 15 mg via ORAL
  Filled 2020-12-18 (×2): qty 3

## 2020-12-18 MED ORDER — FUROSEMIDE 40 MG PO TABS
40.0000 mg | ORAL_TABLET | Freq: Every day | ORAL | Status: DC
  Administered 2020-12-19: 40 mg via ORAL
  Filled 2020-12-18: qty 1

## 2020-12-18 MED ORDER — CEPHALEXIN 500 MG PO CAPS
500.0000 mg | ORAL_CAPSULE | Freq: Once | ORAL | Status: AC
  Administered 2020-12-18: 500 mg via ORAL
  Filled 2020-12-18: qty 1

## 2020-12-18 MED ORDER — LEVOTHYROXINE SODIUM 50 MCG PO TABS
200.0000 ug | ORAL_TABLET | Freq: Every day | ORAL | Status: DC
  Administered 2020-12-19: 200 ug via ORAL
  Filled 2020-12-18: qty 4

## 2020-12-18 MED ORDER — CITALOPRAM HYDROBROMIDE 20 MG PO TABS
40.0000 mg | ORAL_TABLET | Freq: Every day | ORAL | Status: DC
  Administered 2020-12-19: 40 mg via ORAL
  Filled 2020-12-18: qty 2

## 2020-12-18 MED ORDER — LORAZEPAM 0.5 MG PO TABS
0.5000 mg | ORAL_TABLET | Freq: Two times a day (BID) | ORAL | Status: DC
  Administered 2020-12-19 (×2): 0.5 mg via ORAL
  Filled 2020-12-18 (×2): qty 1

## 2020-12-18 MED ORDER — QUETIAPINE FUMARATE 25 MG PO TABS: 25.0000 mg | ORAL_TABLET | Freq: Every evening | ORAL | Status: DC | PRN

## 2020-12-18 MED ORDER — DILTIAZEM HCL ER COATED BEADS 300 MG PO CP24
300.0000 mg | ORAL_CAPSULE | Freq: Every day | ORAL | Status: DC
  Administered 2020-12-19: 300 mg via ORAL
  Filled 2020-12-18: qty 1

## 2020-12-18 MED ORDER — FLUTICASONE FUROATE-VILANTEROL 200-25 MCG/ACT IN AEPB
1.0000 | INHALATION_SPRAY | Freq: Every day | RESPIRATORY_TRACT | Status: DC
  Administered 2020-12-19: 1 via RESPIRATORY_TRACT
  Filled 2020-12-18: qty 28

## 2020-12-18 NOTE — ED Triage Notes (Signed)
Pt comes via EMS from home with c/o fall and multiple skin tears. Pt went to bathroom and had BM. Pt was trying to clean it up on floor and fell forward between commode and wall. Pt states she was trying to get up and couldn't.  Pt did hit her head. No loc or blood thinners known.
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
## 2020-12-18 NOTE — ED Notes (Signed)
Director of [facility] at bedside with pt at this time. Per [facility] they will take pt home once cleared and deal with abrasions. Will inform MD asap.

## 2020-12-19 LAB — CBC
HCT: 38.2 % (ref 36.0–46.0)
Hemoglobin: 11.4 g/dL — ABNORMAL LOW (ref 12.0–15.0)
MCH: 23.8 pg — ABNORMAL LOW (ref 26.0–34.0)
MCHC: 29.8 g/dL — ABNORMAL LOW (ref 30.0–36.0)
MCV: 79.9 fL — ABNORMAL LOW (ref 80.0–100.0)
Platelets: 395 10*3/uL (ref 150–400)
RBC: 4.78 MIL/uL (ref 3.87–5.11)
RDW: 21.2 % — ABNORMAL HIGH (ref 11.5–15.5)
WBC: 11.2 10*3/uL — ABNORMAL HIGH (ref 4.0–10.5)
nRBC: 0.2 % (ref 0.0–0.2)

## 2020-12-19 LAB — RESP PANEL BY RT-PCR (FLU A&B, COVID) ARPGX2
Influenza A by PCR: NEGATIVE
Influenza B by PCR: NEGATIVE
SARS Coronavirus 2 by RT PCR: NEGATIVE

## 2020-12-19 LAB — BASIC METABOLIC PANEL
Anion gap: 9 (ref 5–15)
BUN: 17 mg/dL (ref 8–23)
CO2: 32 mmol/L (ref 22–32)
Calcium: 8.6 mg/dL — ABNORMAL LOW (ref 8.9–10.3)
Chloride: 97 mmol/L — ABNORMAL LOW (ref 98–111)
Creatinine, Ser: 0.94 mg/dL (ref 0.44–1.00)
GFR, Estimated: >60 mL/min (ref >60)
Glucose, Bld: 102 mg/dL — ABNORMAL HIGH (ref 70–99)
Potassium: 3.2 mmol/L — ABNORMAL LOW (ref 3.5–5.1)
Sodium: 138 mmol/L (ref 135–145)

## 2020-12-19 MED ORDER — POTASSIUM CHLORIDE CRYS ER 20 MEQ PO TBCR
40.0000 meq | EXTENDED_RELEASE_TABLET | Freq: Once | ORAL | Status: AC
  Administered 2020-12-19: 40 meq via ORAL
  Filled 2020-12-19: qty 2

## 2020-12-19 MED ORDER — CEPHALEXIN 250 MG PO CAPS
250.0000 mg | ORAL_CAPSULE | Freq: Four times a day (QID) | ORAL | Status: DC
  Administered 2020-12-19: 250 mg via ORAL
  Filled 2020-12-19: qty 1

## 2020-12-19 NOTE — ED Notes (Signed)
Pt sleeping at this time. Respirations even and unlabored, NAD noted

## 2020-12-19 NOTE — ED Provider Notes (Signed)
-----------------------------------------   6:43 AM on 12/19/2020 -----------------------------------------   Blood pressure (!) 111/57, pulse 89, temperature 99 F (37.2 C), resp. rate 17, SpO2 98 %.  The patient is calm and cooperative at this time.  There have been no acute events since the last update.  Awaiting disposition plan from social work team.   Paulette Blanch, MD 12/19/20 [phone]

## 2020-12-19 NOTE — TOC Initial Note (Signed)
Transition of Care [facility]) - Progression Note    Patient Details  Name: Summer Bradley MRN: [id number] Date of Birth: 1945/10/09  Transition of Care [facility]) CM/SW [place], [place] Phone Number: [phone] 12/19/2020, 11:27 AM  Clinical Narrative:     Patient presents to [facility] ED due to fall at home. Patient followed by [facility], will return home with home health. CSW contacted [place] w/ [facility] [phone] with update.   Barriers to Discharge: No Barriers Identified  Expected Discharge Plan and Services                                                 Social Determinants of Health (SDOH) Interventions    Readmission Risk Interventions Readmission Risk Prevention Plan 11/18/2020 09/21/2019 09/20/2019  Transportation Screening Complete Complete Complete  PCP or Specialist Appt within 3-5 Days Complete Complete Complete  HRI or Home Care Consult Complete Complete Complete  Social Work Consult for [place] Planning/Counseling Complete - -  Palliative Care Screening Not Applicable Not Applicable Not Applicable  Medication Review (RN Care Manager) Complete Complete Complete  Some recent data might be hidden

## 2020-12-19 NOTE — ED Notes (Signed)
Patient's brief wet. Patient changed into dry brief and purewick reapplied.

## 2020-12-19 NOTE — Consult Note (Signed)
WOC Nurse Consult Note: Reason for Consult:laceration to left LE. Ten sutures to distal portion, proximal portion left open due to full thickness area of skin loss Wound type:Trauma (fall) Pressure Injury POA: N/A Measurement:Per Dr. Lou Cal, ED Provider who performed suturing 5cm length with 53mm depth Wound bed:red, moist Drainage (amount, consistency, odor) serosanguinous Periwound: erythematous, edematous Dressing procedure/placement/frequency: Systemic antibiotics have been ordered. An antimicrobial nonadherent gauze (xeroform) dressing changed daily after cleansing is indicated. Orders provided for Nursing for topping with dry gauze, ABD pad and securing with roll gauze/paper tape.  These dressings are readily available in the community. A feminine hygiene pad may be substituted for the ABD pad at home.  Patient to have sutures removed by PCP in approximately 2 weeks.  [place] nursing team will not follow, but will remain available to this patient, the nursing and medical teams.  Please re-consult if needed. Thanks, Maudie Flakes, MSN, RN, [place], Arther Abbott  Pager# [phone]

## 2020-12-19 NOTE — ED Notes (Signed)
Patient repositioned in bed. Patient given TV remote at this time.

## 2021-01-18 ENCOUNTER — Other Ambulatory Visit: Payer: Self-pay

## 2021-01-18 ENCOUNTER — Inpatient Hospital Stay
Admission: EM | Admit: 2021-01-18 | Discharge: 2021-01-21 | DRG: 190 | Disposition: A | Discharge status: Home Health | Payer: Medicare (Managed Care) | Attending: Internal Medicine | Admitting: Internal Medicine

## 2021-01-18 ENCOUNTER — Emergency Department: Payer: Medicare (Managed Care)

## 2021-01-18 DIAGNOSIS — J9621 Acute and chronic respiratory failure with hypoxia: Secondary | ICD-10-CM | POA: Diagnosis present

## 2021-01-18 DIAGNOSIS — E7800 Pure hypercholesterolemia, unspecified: Secondary | ICD-10-CM | POA: Diagnosis present

## 2021-01-18 DIAGNOSIS — E872 Acidosis, unspecified: Secondary | ICD-10-CM | POA: Diagnosis present

## 2021-01-18 DIAGNOSIS — Z7901 Long term (current) use of anticoagulants: Secondary | ICD-10-CM

## 2021-01-18 DIAGNOSIS — Z87891 Personal history of nicotine dependence: Secondary | ICD-10-CM

## 2021-01-18 DIAGNOSIS — I5032 Chronic diastolic (congestive) heart failure: Secondary | ICD-10-CM | POA: Diagnosis present

## 2021-01-18 DIAGNOSIS — E119 Type 2 diabetes mellitus without complications: Secondary | ICD-10-CM

## 2021-01-18 DIAGNOSIS — F411 Generalized anxiety disorder: Secondary | ICD-10-CM | POA: Diagnosis present

## 2021-01-18 DIAGNOSIS — E1165 Type 2 diabetes mellitus with hyperglycemia: Secondary | ICD-10-CM | POA: Diagnosis present

## 2021-01-18 DIAGNOSIS — E039 Hypothyroidism, unspecified: Secondary | ICD-10-CM | POA: Diagnosis present

## 2021-01-18 DIAGNOSIS — I509 Heart failure, unspecified: Secondary | ICD-10-CM

## 2021-01-18 DIAGNOSIS — Z888 Allergy status to other drugs, medicaments and biological substances status: Secondary | ICD-10-CM | POA: Diagnosis not present

## 2021-01-18 DIAGNOSIS — Z66 Do not resuscitate: Secondary | ICD-10-CM | POA: Diagnosis present

## 2021-01-18 DIAGNOSIS — I110 Hypertensive heart disease with heart failure: Secondary | ICD-10-CM | POA: Diagnosis present

## 2021-01-18 DIAGNOSIS — J441 Chronic obstructive pulmonary disease with (acute) exacerbation: Principal | ICD-10-CM | POA: Diagnosis present

## 2021-01-18 DIAGNOSIS — I10 Essential (primary) hypertension: Secondary | ICD-10-CM

## 2021-01-18 DIAGNOSIS — Z9981 Dependence on supplemental oxygen: Secondary | ICD-10-CM | POA: Diagnosis not present

## 2021-01-18 DIAGNOSIS — K219 Gastro-esophageal reflux disease without esophagitis: Secondary | ICD-10-CM | POA: Diagnosis present

## 2021-01-18 DIAGNOSIS — Z8673 Personal history of transient ischemic attack (TIA), and cerebral infarction without residual deficits: Secondary | ICD-10-CM | POA: Diagnosis not present

## 2021-01-18 DIAGNOSIS — Z79890 Hormone replacement therapy (postmenopausal): Secondary | ICD-10-CM | POA: Diagnosis not present

## 2021-01-18 DIAGNOSIS — I639 Cerebral infarction, unspecified: Secondary | ICD-10-CM | POA: Diagnosis present

## 2021-01-18 DIAGNOSIS — J9601 Acute respiratory failure with hypoxia: Secondary | ICD-10-CM

## 2021-01-18 DIAGNOSIS — I480 Paroxysmal atrial fibrillation: Secondary | ICD-10-CM | POA: Diagnosis not present

## 2021-01-18 DIAGNOSIS — Z7983 Long term (current) use of bisphosphonates: Secondary | ICD-10-CM

## 2021-01-18 DIAGNOSIS — F419 Anxiety disorder, unspecified: Secondary | ICD-10-CM | POA: Diagnosis present

## 2021-01-18 DIAGNOSIS — Z20822 Contact with and (suspected) exposure to covid-19: Secondary | ICD-10-CM | POA: Diagnosis present

## 2021-01-18 DIAGNOSIS — I4891 Unspecified atrial fibrillation: Secondary | ICD-10-CM

## 2021-01-18 DIAGNOSIS — E875 Hyperkalemia: Secondary | ICD-10-CM | POA: Diagnosis present

## 2021-01-18 DIAGNOSIS — E785 Hyperlipidemia, unspecified: Secondary | ICD-10-CM | POA: Diagnosis present

## 2021-01-18 DIAGNOSIS — I272 Pulmonary hypertension, unspecified: Secondary | ICD-10-CM | POA: Diagnosis present

## 2021-01-18 DIAGNOSIS — A419 Sepsis, unspecified organism: Secondary | ICD-10-CM

## 2021-01-18 DIAGNOSIS — Z79899 Other long term (current) drug therapy: Secondary | ICD-10-CM

## 2021-01-18 DIAGNOSIS — Z7951 Long term (current) use of inhaled steroids: Secondary | ICD-10-CM

## 2021-01-18 DIAGNOSIS — I4819 Other persistent atrial fibrillation: Secondary | ICD-10-CM | POA: Diagnosis present

## 2021-01-18 LAB — GLUCOSE, CAPILLARY
Glucose-Capillary: 205 mg/dL — ABNORMAL HIGH (ref 70–99)
Glucose-Capillary: 229 mg/dL — ABNORMAL HIGH (ref 70–99)

## 2021-01-18 LAB — TROPONIN I (HIGH SENSITIVITY)
Troponin I (High Sensitivity): 11 ng/L (ref <18)
Troponin I (High Sensitivity): 12 ng/L (ref <18)

## 2021-01-18 LAB — CBC WITH DIFFERENTIAL/PLATELET
Abs Immature Granulocytes: 0.15 10*3/uL — ABNORMAL HIGH (ref 0.00–0.07)
Basophils Absolute: 0.1 10*3/uL (ref 0.0–0.1)
Basophils Relative: 1 %
Eosinophils Absolute: 0.3 10*3/uL (ref 0.0–0.5)
Eosinophils Relative: 2 %
HCT: 38.3 % (ref 36.0–46.0)
Hemoglobin: 11.4 g/dL — ABNORMAL LOW (ref 12.0–15.0)
Immature Granulocytes: 1 %
Lymphocytes Relative: 11 %
Lymphs Abs: 1.7 10*3/uL (ref 0.7–4.0)
MCH: 24.1 pg — ABNORMAL LOW (ref 26.0–34.0)
MCHC: 29.8 g/dL — ABNORMAL LOW (ref 30.0–36.0)
MCV: 80.8 fL (ref 80.0–100.0)
Monocytes Absolute: 1.1 10*3/uL — ABNORMAL HIGH (ref 0.1–1.0)
Monocytes Relative: 7 %
Neutro Abs: 12.3 10*3/uL — ABNORMAL HIGH (ref 1.7–7.7)
Neutrophils Relative %: 78 %
Platelets: 403 10*3/uL — ABNORMAL HIGH (ref 150–400)
RBC: 4.74 MIL/uL (ref 3.87–5.11)
RDW: 19.7 % — ABNORMAL HIGH (ref 11.5–15.5)
WBC: 15.7 10*3/uL — ABNORMAL HIGH (ref 4.0–10.5)
nRBC: 0 % (ref 0.0–0.2)

## 2021-01-18 LAB — LACTIC ACID, PLASMA
Lactic Acid, Venous: 4.1 mmol/L (ref 0.5–1.9)
Lactic Acid, Venous: 4 mmol/L (ref 0.5–1.9)
Lactic Acid, Venous: 6.4 mmol/L (ref 0.5–1.9)
Lactic Acid, Venous: 5.1 mmol/L (ref 0.5–1.9)

## 2021-01-18 LAB — COMPREHENSIVE METABOLIC PANEL
ALT: 16 U/L (ref 0–44)
AST: 26 U/L (ref 15–41)
Albumin: 3.6 g/dL (ref 3.5–5.0)
Alkaline Phosphatase: 67 U/L (ref 38–126)
Anion gap: 12 (ref 5–15)
BUN: 15 mg/dL (ref 8–23)
CO2: 26 mmol/L (ref 22–32)
Calcium: 8.8 mg/dL — ABNORMAL LOW (ref 8.9–10.3)
Chloride: 100 mmol/L (ref 98–111)
Creatinine, Ser: 0.83 mg/dL (ref 0.44–1.00)
GFR, Estimated: >60 mL/min (ref >60)
Glucose, Bld: 181 mg/dL — ABNORMAL HIGH (ref 70–99)
Potassium: 4.2 mmol/L (ref 3.5–5.1)
Sodium: 138 mmol/L (ref 135–145)
Total Bilirubin: 1 mg/dL (ref 0.3–1.2)
Total Protein: 7.1 g/dL (ref 6.5–8.1)

## 2021-01-18 LAB — URINALYSIS, COMPLETE (UACMP) WITH MICROSCOPIC
Bacteria, UA: NONE SEEN
Bilirubin Urine: NEGATIVE
Glucose, UA: 50 mg/dL — AB
Hgb urine dipstick: NEGATIVE
Ketones, ur: NEGATIVE mg/dL
Leukocytes,Ua: NEGATIVE
Nitrite: NEGATIVE
Protein, ur: NEGATIVE mg/dL
Specific Gravity, Urine: 1.006 (ref 1.005–1.030)
pH: 6 (ref 5.0–8.0)

## 2021-01-18 LAB — RESP PANEL BY RT-PCR (FLU A&B, COVID) ARPGX2
Influenza A by PCR: NEGATIVE
Influenza B by PCR: NEGATIVE
SARS Coronavirus 2 by RT PCR: NEGATIVE

## 2021-01-18 LAB — BLOOD GAS, VENOUS
Acid-Base Excess: 2.3 mmol/L — ABNORMAL HIGH (ref 0.0–2.0)
Bicarbonate: 27.3 mmol/L (ref 20.0–28.0)
O2 Saturation: 96.7 %
Patient temperature: 37
pCO2, Ven: 43 mmHg — ABNORMAL LOW (ref 44.0–60.0)
pH, Ven: 7.41 (ref 7.250–7.430)
pO2, Ven: 87 mmHg — ABNORMAL HIGH (ref 32.0–45.0)

## 2021-01-18 LAB — BRAIN NATRIURETIC PEPTIDE: B Natriuretic Peptide: 265 pg/mL — ABNORMAL HIGH (ref 0.0–100.0)

## 2021-01-18 LAB — PROCALCITONIN: Procalcitonin: <0.1 ng/mL

## 2021-01-18 MED ORDER — ACETAMINOPHEN 650 MG RE SUPP
650.0000 mg | Freq: Four times a day (QID) | RECTAL | Status: DC | PRN
  Filled 2021-01-18: qty 1

## 2021-01-18 MED ORDER — DILTIAZEM HCL 25 MG/5ML IV SOLN
INTRAVENOUS | Status: AC
  Administered 2021-01-18: 10 mg
  Filled 2021-01-18: qty 5

## 2021-01-18 MED ORDER — APIXABAN 5 MG PO TABS
5.0000 mg | ORAL_TABLET | Freq: Two times a day (BID) | ORAL | Status: DC
  Administered 2021-01-18 – 2021-01-21 (×6): 5 mg via ORAL
  Filled 2021-01-18 (×6): qty 1

## 2021-01-18 MED ORDER — FLUTICASONE FUROATE-VILANTEROL 200-25 MCG/ACT IN AEPB
1.0000 | INHALATION_SPRAY | Freq: Every day | RESPIRATORY_TRACT | Status: DC
  Administered 2021-01-19 – 2021-01-21 (×3): 1 via RESPIRATORY_TRACT
  Filled 2021-01-18: qty 28

## 2021-01-18 MED ORDER — IPRATROPIUM-ALBUTEROL 0.5-2.5 (3) MG/3ML IN SOLN
RESPIRATORY_TRACT | Status: AC
  Administered 2021-01-18: 3 mL
  Filled 2021-01-18: qty 3

## 2021-01-18 MED ORDER — DILTIAZEM HCL-DEXTROSE 125-5 MG/125ML-% IV SOLN (PREMIX)
5.0000 mg/h | INTRAVENOUS | Status: DC
  Administered 2021-01-18: 5 mg/h via INTRAVENOUS
  Filled 2021-01-18: qty 125

## 2021-01-18 MED ORDER — OXYCODONE HCL 5 MG PO TABS: 5.0000 mg | ORAL_TABLET | ORAL | Status: DC | PRN

## 2021-01-18 MED ORDER — SODIUM CHLORIDE 0.9 % IV BOLUS
1000.0000 mL | Freq: Once | INTRAVENOUS | Status: AC
  Administered 2021-01-18: 1000 mL via INTRAVENOUS

## 2021-01-18 MED ORDER — MIDAZOLAM HCL 2 MG/2ML IJ SOLN
0.5000 mg | Freq: Once | INTRAMUSCULAR | Status: AC
  Administered 2021-01-18: 0.5 mg via INTRAVENOUS
  Filled 2021-01-18: qty 2

## 2021-01-18 MED ORDER — DOXYCYCLINE HYCLATE 100 MG PO TABS
100.0000 mg | ORAL_TABLET | Freq: Two times a day (BID) | ORAL | Status: DC
  Administered 2021-01-18 – 2021-01-21 (×6): 100 mg via ORAL
  Filled 2021-01-18 (×6): qty 1

## 2021-01-18 MED ORDER — IPRATROPIUM-ALBUTEROL 0.5-2.5 (3) MG/3ML IN SOLN
9.0000 mL | Freq: Once | RESPIRATORY_TRACT | Status: AC
  Administered 2021-01-18: 9 mL via RESPIRATORY_TRACT
  Filled 2021-01-18: qty 27

## 2021-01-18 MED ORDER — POLYETHYLENE GLYCOL 3350 17 G PO PACK: 17.0000 g | PACK | Freq: Every day | ORAL | Status: DC | PRN

## 2021-01-18 MED ORDER — POTASSIUM CHLORIDE CRYS ER 20 MEQ PO TBCR
40.0000 meq | EXTENDED_RELEASE_TABLET | Freq: Two times a day (BID) | ORAL | Status: DC
  Administered 2021-01-18: 40 meq via ORAL
  Filled 2021-01-18: qty 2

## 2021-01-18 MED ORDER — SODIUM CHLORIDE 0.9 % IV SOLN
1.0000 g | Freq: Once | INTRAVENOUS | Status: AC
  Administered 2021-01-18: 1 g via INTRAVENOUS
  Filled 2021-01-18: qty 10

## 2021-01-18 MED ORDER — METHYLPREDNISOLONE SODIUM SUCC 125 MG IJ SOLR
125.0000 mg | Freq: Once | INTRAMUSCULAR | Status: AC
  Administered 2021-01-18: 125 mg via INTRAVENOUS
  Filled 2021-01-18: qty 2

## 2021-01-18 MED ORDER — LEVOTHYROXINE SODIUM 100 MCG PO TABS
200.0000 ug | ORAL_TABLET | Freq: Every day | ORAL | Status: DC
  Administered 2021-01-19 – 2021-01-21 (×3): 200 ug via ORAL
  Filled 2021-01-18 (×3): qty 2

## 2021-01-18 MED ORDER — BISOPROLOL FUMARATE 5 MG PO TABS
2.5000 mg | ORAL_TABLET | Freq: Two times a day (BID) | ORAL | Status: DC
  Administered 2021-01-18 – 2021-01-21 (×6): 2.5 mg via ORAL
  Filled 2021-01-18 (×8): qty 0.5

## 2021-01-18 MED ORDER — INSULIN ASPART 100 UNIT/ML IJ SOLN
0.0000 [IU] | Freq: Three times a day (TID) | INTRAMUSCULAR | Status: DC
  Administered 2021-01-19 (×3): 2 [IU] via SUBCUTANEOUS
  Administered 2021-01-20: 1 [IU] via SUBCUTANEOUS
  Administered 2021-01-20: 2 [IU] via SUBCUTANEOUS
  Filled 2021-01-18 (×5): qty 1

## 2021-01-18 MED ORDER — CITALOPRAM HYDROBROMIDE 20 MG PO TABS
40.0000 mg | ORAL_TABLET | Freq: Every day | ORAL | Status: DC
  Administered 2021-01-18 – 2021-01-20 (×3): 40 mg via ORAL
  Filled 2021-01-18 (×3): qty 2

## 2021-01-18 MED ORDER — ONDANSETRON HCL 4 MG PO TABS: 4.0000 mg | ORAL_TABLET | Freq: Four times a day (QID) | ORAL | Status: DC | PRN

## 2021-01-18 MED ORDER — FUROSEMIDE 40 MG PO TABS
40.0000 mg | ORAL_TABLET | Freq: Every day | ORAL | Status: DC
  Administered 2021-01-19 – 2021-01-21 (×3): 40 mg via ORAL
  Filled 2021-01-18 (×3): qty 1

## 2021-01-18 MED ORDER — INSULIN ASPART 100 UNIT/ML IJ SOLN
0.0000 [IU] | Freq: Every day | INTRAMUSCULAR | Status: DC
Start: 2021-01-18 — End: 2021-01-21
  Administered 2021-01-18: 2 [IU] via SUBCUTANEOUS
  Filled 2021-01-18: qty 1

## 2021-01-18 MED ORDER — ALBUTEROL SULFATE (2.5 MG/3ML) 0.083% IN NEBU
2.5000 mg | INHALATION_SOLUTION | Freq: Four times a day (QID) | RESPIRATORY_TRACT | Status: DC | PRN
  Administered 2021-01-19 – 2021-01-20 (×4): 2.5 mg via RESPIRATORY_TRACT
  Filled 2021-01-18 (×4): qty 3

## 2021-01-18 MED ORDER — ONDANSETRON HCL 4 MG/2ML IJ SOLN: 4.0000 mg | Freq: Four times a day (QID) | INTRAMUSCULAR | Status: DC | PRN

## 2021-01-18 MED ORDER — DILTIAZEM LOAD VIA INFUSION
10.0000 mg | Freq: Once | INTRAVENOUS | Status: DC
  Filled 2021-01-18: qty 10

## 2021-01-18 MED ORDER — SODIUM CHLORIDE 0.9 % IV SOLN
500.0000 mg | Freq: Once | INTRAVENOUS | Status: DC
  Filled 2021-01-18: qty 500

## 2021-01-18 MED ORDER — IPRATROPIUM-ALBUTEROL 0.5-2.5 (3) MG/3ML IN SOLN
3.0000 mL | Freq: Once | RESPIRATORY_TRACT | Status: AC
  Administered 2021-01-18: 3 mL via RESPIRATORY_TRACT

## 2021-01-18 MED ORDER — SODIUM CHLORIDE 0.9 % IV SOLN: INTRAVENOUS | Status: DC

## 2021-01-18 MED ORDER — QUETIAPINE FUMARATE 25 MG PO TABS
25.0000 mg | ORAL_TABLET | Freq: Every evening | ORAL | Status: DC | PRN
  Administered 2021-01-18 – 2021-01-19 (×2): 25 mg via ORAL
  Filled 2021-01-18 (×2): qty 1

## 2021-01-18 MED ORDER — ACETAMINOPHEN 325 MG PO TABS: 650.0000 mg | ORAL_TABLET | Freq: Four times a day (QID) | ORAL | Status: DC | PRN

## 2021-01-18 MED ORDER — PREDNISONE 20 MG PO TABS
40.0000 mg | ORAL_TABLET | Freq: Every day | ORAL | Status: DC
  Administered 2021-01-19 – 2021-01-21 (×3): 40 mg via ORAL
  Filled 2021-01-18 (×3): qty 2

## 2021-01-18 MED ORDER — LORAZEPAM 0.5 MG PO TABS
0.5000 mg | ORAL_TABLET | Freq: Two times a day (BID) | ORAL | Status: DC
  Administered 2021-01-18 – 2021-01-19 (×2): 0.5 mg via ORAL
  Filled 2021-01-18 (×2): qty 1

## 2021-01-18 MED ORDER — TIOTROPIUM BROMIDE MONOHYDRATE 18 MCG IN CAPS
1.0000 | ORAL_CAPSULE | Freq: Every day | RESPIRATORY_TRACT | Status: DC
  Administered 2021-01-19 – 2021-01-21 (×3): 18 ug via RESPIRATORY_TRACT
  Filled 2021-01-18: qty 5

## 2021-01-18 MED ORDER — FAMOTIDINE 20 MG PO TABS
20.0000 mg | ORAL_TABLET | Freq: Every day | ORAL | Status: DC
  Administered 2021-01-19 – 2021-01-21 (×3): 20 mg via ORAL
  Filled 2021-01-18 (×3): qty 1

## 2021-01-18 MED ORDER — LACTATED RINGERS IV BOLUS
1000.0000 mL | Freq: Once | INTRAVENOUS | Status: AC
  Administered 2021-01-18: 1000 mL via INTRAVENOUS

## 2021-01-18 MED ORDER — IPRATROPIUM-ALBUTEROL 0.5-2.5 (3) MG/3ML IN SOLN
RESPIRATORY_TRACT | Status: AC
  Filled 2021-01-18: qty 9

## 2021-01-18 MED ORDER — DILTIAZEM HCL ER COATED BEADS 300 MG PO CP24
300.0000 mg | ORAL_CAPSULE | Freq: Every day | ORAL | Status: DC
  Administered 2021-01-18 – 2021-01-20 (×3): 300 mg via ORAL
  Filled 2021-01-18 (×4): qty 1

## 2021-01-18 MED ORDER — LORAZEPAM 0.5 MG PO TABS
0.5000 mg | ORAL_TABLET | Freq: Two times a day (BID) | ORAL | Status: DC
  Administered 2021-01-18: 0.5 mg via ORAL
  Filled 2021-01-18: qty 1

## 2021-01-18 MED ORDER — FUROSEMIDE 10 MG/ML IJ SOLN
40.0000 mg | Freq: Once | INTRAMUSCULAR | Status: AC
  Administered 2021-01-18: 40 mg via INTRAVENOUS
  Filled 2021-01-18: qty 4

## 2021-01-18 MED ORDER — BUSPIRONE HCL 15 MG PO TABS
15.0000 mg | ORAL_TABLET | Freq: Two times a day (BID) | ORAL | Status: DC
  Administered 2021-01-18 – 2021-01-21 (×6): 15 mg via ORAL
  Filled 2021-01-18 (×7): qty 1

## 2021-01-18 NOTE — ED Triage Notes (Signed)
C/o SOB since this afternoon when pt. Was walking to bathroom. Per medic, pt. Denied any medication or interventions. Medic states pt. HR and O2 sat return to normal when she calms down, but when she gets agitated, or exertion.

## 2021-01-18 NOTE — Progress Notes (Signed)
   01/18/21 1828  Assess: MEWS Score  Temp 98.5 F (36.9 C)  BP 116/76  Pulse Rate (!) 126  Resp 14  Level of Consciousness Alert  SpO2 100 %  O2 Device Nasal Cannula  O2 Flow Rate (L/min) 3 L/min  Assess: MEWS Score  MEWS Temp 0  MEWS Systolic 0  MEWS Pulse 2  MEWS RR 0  MEWS LOC 0  MEWS Score 2  MEWS Score Color Yellow  Assess: if the MEWS score is Yellow or Red  Were vital signs taken at a resting state? Yes  Focused Assessment No change from prior assessment  Does the patient meet 2 or more of the SIRS criteria? No  MEWS guidelines implemented *See Row Information* Yes  Treat  Pain Scale 0-10  Pain Score 0  Take Vital Signs  Increase Vital Sign Frequency  Yellow: Q 2hr X 2 then Q 4hr X 2, if remains yellow, continue Q 4hrs  Escalate  MEWS: Escalate Yellow: discuss with charge nurse/RN and consider discussing with provider and RRT  Notify: Charge Nurse/RN  Name of Charge Nurse/RN Notified Phoebe Sharps RN  Date Charge Nurse/RN Notified 01/18/21  Time Charge Nurse/RN Notified 2018  Document  Patient Outcome Other (Comment) (Bolus infusing)  Assess: SIRS CRITERIA  SIRS Temperature  0  SIRS Pulse 1  SIRS Respirations  0  SIRS WBC 0  SIRS Score Sum  1
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
## 2021-01-18 NOTE — Progress Notes (Signed)
Patient admitted to room 208. Patient appears anxious and restless. HR elevated 126 O2 93% MD aware. IV bolus started. Cardiac meds given earlier.

## 2021-01-18 NOTE — ED Notes (Signed)
Critical Result: Lactic Acid:4.1 Dr. Tamala Julian notified.
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
## 2021-01-18 NOTE — Significant Event (Signed)
Patient wants her CODE STATUS changed to DNR.  Gean Birchwood

## 2021-01-19 LAB — COMPREHENSIVE METABOLIC PANEL
ALT: 19 U/L (ref 0–44)
AST: 26 U/L (ref 15–41)
Albumin: 3 g/dL — ABNORMAL LOW (ref 3.5–5.0)
Alkaline Phosphatase: 45 U/L (ref 38–126)
Anion gap: 10 (ref 5–15)
BUN: 21 mg/dL (ref 8–23)
CO2: 21 mmol/L — ABNORMAL LOW (ref 22–32)
Calcium: 7.7 mg/dL — ABNORMAL LOW (ref 8.9–10.3)
Chloride: 105 mmol/L (ref 98–111)
Creatinine, Ser: 0.89 mg/dL (ref 0.44–1.00)
GFR, Estimated: >60 mL/min (ref >60)
Glucose, Bld: 189 mg/dL — ABNORMAL HIGH (ref 70–99)
Potassium: 5.2 mmol/L — ABNORMAL HIGH (ref 3.5–5.1)
Sodium: 136 mmol/L (ref 135–145)
Total Bilirubin: 1 mg/dL (ref 0.3–1.2)
Total Protein: 5.7 g/dL — ABNORMAL LOW (ref 6.5–8.1)

## 2021-01-19 LAB — CBC
HCT: 35.7 % — ABNORMAL LOW (ref 36.0–46.0)
Hemoglobin: 10.5 g/dL — ABNORMAL LOW (ref 12.0–15.0)
MCH: 23.9 pg — ABNORMAL LOW (ref 26.0–34.0)
MCHC: 29.4 g/dL — ABNORMAL LOW (ref 30.0–36.0)
MCV: 81.3 fL (ref 80.0–100.0)
Platelets: 362 10*3/uL (ref 150–400)
RBC: 4.39 MIL/uL (ref 3.87–5.11)
RDW: 19.9 % — ABNORMAL HIGH (ref 11.5–15.5)
WBC: 18 10*3/uL — ABNORMAL HIGH (ref 4.0–10.5)
nRBC: 0 % (ref 0.0–0.2)

## 2021-01-19 LAB — LACTIC ACID, PLASMA
Lactic Acid, Venous: 3.9 mmol/L (ref 0.5–1.9)
Lactic Acid, Venous: 3.2 mmol/L (ref 0.5–1.9)

## 2021-01-19 LAB — GLUCOSE, CAPILLARY
Glucose-Capillary: 153 mg/dL — ABNORMAL HIGH (ref 70–99)
Glucose-Capillary: 176 mg/dL — ABNORMAL HIGH (ref 70–99)
Glucose-Capillary: 159 mg/dL — ABNORMAL HIGH (ref 70–99)
Glucose-Capillary: 125 mg/dL — ABNORMAL HIGH (ref 70–99)

## 2021-01-19 MED ORDER — LORAZEPAM 1 MG PO TABS
1.0000 mg | ORAL_TABLET | Freq: Three times a day (TID) | ORAL | Status: DC | PRN
  Administered 2021-01-19 – 2021-01-20 (×3): 1 mg via ORAL
  Filled 2021-01-19 (×3): qty 1

## 2021-01-19 NOTE — Progress Notes (Signed)
PROGRESS NOTE  Summer Bradley  DOB: [birth year]  PCP: [place] ZOX:[id number]  DOA: 01/18/2021  LOS: 1 day  Hospital Day: 2  Chief Complaint  Patient presents with   Shortness of Breath    C/o SOB since this afternoon when pt. Was walking to bathroom. Per medic, pt. Denied any medication or interventions. Medic states pt. HR and O2 sat return to normal when she calms down, but when she gets agitated, or exertion.    Brief narrative: Summer Bradley is a 75 y.o. female with PMH significant for DM2, HTN, HLD, chronic diastolic CHF, A. fib, COPD on 3L O2 at home, pulmonary hypertension.   Patient presented to the ED on 12/3 for sudden onset shortness of breath. She has chronic cough, denies any fever, chest pain, lower extremity edema, recent change in medications.  In the ED, patient was afebrile, heart rate elevated to 160s, blood pressure elevated to 189/123, initially required up to 5l oxygen by nasal cannula to maintain saturation over 90%.  She was felt like she was having a panic attack. CBC and CMP unremarkable, troponin normal.  BNP 265 at baseline. COVID and influenza PCR negative.  Procalcitonin level low Blood gas with pH 7.41, PCO2 43, HCO3 27. Lactic acid level was however elevated to 4.1. Chest x-ray did not show any evidence of pulm edema EKG showed A. fib with RVR but no acute ischemic changes.  Patient was treated in the ED with methylprednisolone, nebulizers, IV Lasix, also started on Cardizem drip Admitted to hospital service.  Subjective: Patient was seen and examined this morning.  Patient was having an anxiety attack.  She felt she could not breathe but she was maintaining oxygen saturation at 99% on 5 L.  She came down after several minutes of counseling. chart reviewed Overnight heart rate less than 100, required 3 to 4 L oxygen by nasal cannula Labs this morning with potassium elevated to 5.2 Lactic acid level was trended multiple  times since admission, it peaked at 6.4 last night and is down to 3.2 this morning.  Assessment/Plan: Acute on chronic respiratory failure with hypoxia Acute COPD exacerbation -Patient with sudden onset shortness of breath without fever, volume overload -Chest x-ray unremarkable labs unremarkable -Suspect if patient had any mucous plugging with later resolved in the ED probably also exacerbated by anxiety. -On admission, she was started on a short course of prednisone and doxycycline. -Continue bronchodilators.  A. fib with RVR -History of A. fib on bisoprolol, Cardizem and Eliquis at home. -Required Cardizem drip on admission. -Home meds resumed.  Chronic diastolic CHF -Home meds include bisoprolol, Lasix, and metolazone with potassium supplement -Continue all except potassium supplement as her potassium level is elevated this morning. Recent Labs  Lab 01/18/21 1227 01/19/21 0436  K 4.2 5.2*   Lactic acidosis -Lactic acid level was elevated as high as 6.4 without any evidence of infection.  May be contributed by hypoperfusion due to tachycardia. -Trending down.  Type 2 diabetes mellitus with hyperglycemia -A1c 6 on 11/17/2020 -Diet controlled.  Blood sugar level was elevated on admission probably because of anxiety and the use of steroids -Currently on sliding scale insulin with Accu-Cheks Recent Labs  Lab 01/18/21 1830 01/18/21 2032 01/19/21 0746  GLUCAP 205* 229* 153*   Hypothyroidism -continue Synthroid  Anxiety disorder -Because of anxiety attacks, I have placed her on Ativan as needed today.  Continue BuSpar, Celexa, lorazepam, Seroquel, trazodone  GERD -continue famotidine   Mobility: Limited mobility.  Uses walker at home Living condition: Lives alone Goals of care:   Code Status: DNR  Nutritional status: There is no height or weight on file to calculate BMI.      Diet:  Diet Order             Diet heart healthy/carb modified Room service  appropriate? Yes; Fluid consistency: Thin  Diet effective now                  DVT prophylaxis:   apixaban (ELIQUIS) tablet 5 mg   Antimicrobials: Doxycycline Fluid: Stop IV fluid Consultants: None Family Communication: None at bedside  Status is: Inpatient  Continue in-hospital care because: Continues to complain of significant shortness of breath Level of care: Med-Surg   Dispo: The patient is from: Home              Anticipated d/c is to: Hopefully home in 1 to 2 days              Patient currently is not medically stable to d/c.   Difficult to place patient No     Infusions:     Scheduled Meds:  apixaban  5 mg Oral BID   bisoprolol  2.5 mg Oral BID   busPIRone  15 mg Oral BID   citalopram  40 mg Oral QHS   diltiazem  300 mg Oral Daily   doxycycline  100 mg Oral Q12H   famotidine  20 mg Oral Daily   fluticasone furoate-vilanterol  1 puff Inhalation Daily   furosemide  40 mg Oral Daily   insulin aspart  0-5 Units Subcutaneous QHS   insulin aspart  0-9 Units Subcutaneous TID WC   levothyroxine  200 mcg Oral Daily   predniSONE  40 mg Oral Q breakfast   tiotropium  1 capsule Inhalation Daily    PRN meds: acetaminophen **OR** acetaminophen, albuterol, LORazepam, ondansetron **OR** ondansetron (ZOFRAN) IV, oxyCODONE, polyethylene glycol, QUEtiapine   Antimicrobials: Anti-infectives (From admission, onward)    Start     Dose/Rate Route Frequency Ordered Stop   01/18/21 1800  doxycycline (VIBRA-TABS) tablet 100 mg        100 mg Oral Every 12 hours 01/18/21 1710 01/23/21 2159   01/18/21 1415  cefTRIAXone (ROCEPHIN) 1 g in sodium chloride 0.9 % 100 mL IVPB        1 g 200 mL/hr over 30 Minutes Intravenous  Once 01/18/21 1400 01/18/21 1644   01/18/21 1415  azithromycin (ZITHROMAX) 500 mg in sodium chloride 0.9 % 250 mL IVPB  Status:  Discontinued        500 mg 250 mL/hr over 60 Minutes Intravenous  Once 01/18/21 1400 01/18/21 1717       Objective: Vitals:    01/19/21 0745 01/19/21 0751  BP:  120/76  Pulse: (!) 46 66  Resp:  19  Temp:  98.2 F (36.8 C)  SpO2: 100% 100%    Intake/Output Summary (Last 24 hours) at 01/19/2021 1133 Last data filed at 01/19/2021 1015 Gross per 24 hour  Intake 2190.77 ml  Output 600 ml  Net 1590.77 ml   There were no vitals filed for this visit. Weight change:  There is no height or weight on file to calculate BMI.   Physical Exam: General exam: Pleasant, elderly anxious Caucasian female.  Not in visible distress Skin: No rashes, lesions or ulcers. HEENT: Atraumatic, normocephalic, no obvious bleeding Lungs: Diminished air entry in both bases probably because of poor respiratory effort and  tachypnea CVS: Tachycardic with anxiety attack, no murmur GI/Abd soft, nontender, nondistended, bowel sound present CNS: Alert, awake, oriented to place Psychiatry: Anxious Extremities: No pedal edema, no calf tenderness  Data Review: I have personally reviewed the laboratory data and studies available.  F/u labs ordered Unresulted Labs (From admission, onward)     Start     Ordered   01/20/21 0500  CBC with Differential/Platelet  Tomorrow morning,   R       Question:  Specimen collection method  Answer:  Lab=Lab collect   01/19/21 1133   01/20/21 8403  Basic metabolic panel  Tomorrow morning,   R       Question:  Specimen collection method  Answer:  Lab=Lab collect   01/19/21 1133            Signed, Terrilee Croak, MD [facility] 01/19/2021

## 2021-01-19 NOTE — Plan of Care (Signed)
  Problem: Clinical Measurements: Goal: Ability to maintain clinical measurements within normal limits will improve Outcome: Progressing Goal: Will remain free from infection Outcome: Progressing Goal: Diagnostic test results will improve Outcome: Progressing Goal: Respiratory complications will improve Outcome: Progressing Goal: Cardiovascular complication will be avoided Outcome: Progressing   Problem: Pain Managment: Goal: General experience of comfort will improve Outcome: Progressing   Pt is alert and orientedx3. V/S stable. Reports on & off shortness of breath. Oxygen sat at 100%, shallow breathing noted. Breathing tx given.

## 2021-01-19 NOTE — Progress Notes (Signed)
PT Cancellation Note  Patient Details Name: Summer Bradley MRN: [id number] DOB: [birth year]   Cancelled Treatment:    Reason Eval/Treat Not Completed:  (Consult received and chart reviewed. Per primary RN, patient with "rough morning"-significant SOB, anxiety, fear of falling; "just got settled".  Recommends hold at this time with re-attempt next date as appropriate.)  Darielle Hancher H. Owens Shark, PT, DPT, NCS 01/19/21, 1:59 PM [phone]

## 2021-01-19 NOTE — Progress Notes (Signed)
Pt Lactic acid is 6.4. Oncall provider informed.

## 2021-01-20 LAB — BASIC METABOLIC PANEL
Anion gap: 7 (ref 5–15)
BUN: 28 mg/dL — ABNORMAL HIGH (ref 8–23)
CO2: 25 mmol/L (ref 22–32)
Calcium: 8.4 mg/dL — ABNORMAL LOW (ref 8.9–10.3)
Chloride: 108 mmol/L (ref 98–111)
Creatinine, Ser: 0.73 mg/dL (ref 0.44–1.00)
GFR, Estimated: >60 mL/min (ref >60)
Glucose, Bld: 113 mg/dL — ABNORMAL HIGH (ref 70–99)
Potassium: 4.2 mmol/L (ref 3.5–5.1)
Sodium: 140 mmol/L (ref 135–145)

## 2021-01-20 LAB — CBC WITH DIFFERENTIAL/PLATELET
Abs Immature Granulocytes: 0.19 10*3/uL — ABNORMAL HIGH (ref 0.00–0.07)
Basophils Absolute: 0 10*3/uL (ref 0.0–0.1)
Basophils Relative: 0 %
Eosinophils Absolute: 0 10*3/uL (ref 0.0–0.5)
Eosinophils Relative: 0 %
HCT: 34.5 % — ABNORMAL LOW (ref 36.0–46.0)
Hemoglobin: 10.3 g/dL — ABNORMAL LOW (ref 12.0–15.0)
Immature Granulocytes: 1 %
Lymphocytes Relative: 9 %
Lymphs Abs: 2.2 10*3/uL (ref 0.7–4.0)
MCH: 23.9 pg — ABNORMAL LOW (ref 26.0–34.0)
MCHC: 29.9 g/dL — ABNORMAL LOW (ref 30.0–36.0)
MCV: 80 fL (ref 80.0–100.0)
Monocytes Absolute: 1.6 10*3/uL — ABNORMAL HIGH (ref 0.1–1.0)
Monocytes Relative: 7 %
Neutro Abs: 19.2 10*3/uL — ABNORMAL HIGH (ref 1.7–7.7)
Neutrophils Relative %: 83 %
Platelets: 398 10*3/uL (ref 150–400)
RBC: 4.31 MIL/uL (ref 3.87–5.11)
RDW: 19.9 % — ABNORMAL HIGH (ref 11.5–15.5)
WBC: 23.2 10*3/uL — ABNORMAL HIGH (ref 4.0–10.5)
nRBC: 0.1 % (ref 0.0–0.2)

## 2021-01-20 LAB — GLUCOSE, CAPILLARY
Glucose-Capillary: 88 mg/dL (ref 70–99)
Glucose-Capillary: 126 mg/dL — ABNORMAL HIGH (ref 70–99)
Glucose-Capillary: 167 mg/dL — ABNORMAL HIGH (ref 70–99)
Glucose-Capillary: 137 mg/dL — ABNORMAL HIGH (ref 70–99)

## 2021-01-20 MED ORDER — LORAZEPAM 1 MG PO TABS
1.0000 mg | ORAL_TABLET | ORAL | Status: DC | PRN
  Administered 2021-01-20 – 2021-01-21 (×3): 1 mg via ORAL
  Filled 2021-01-20 (×3): qty 1

## 2021-01-20 MED ORDER — QUETIAPINE FUMARATE 25 MG PO TABS
50.0000 mg | ORAL_TABLET | Freq: Every day | ORAL | Status: DC
  Administered 2021-01-20: 50 mg via ORAL
  Filled 2021-01-20: qty 2

## 2021-01-20 NOTE — TOC Initial Note (Signed)
Transition of Care [facility]) - Initial/Assessment Note    Patient Details  Name: Summer Bradley MRN: [id number] Date of Birth: 01/25/1946  [facility] of [facility] CM/SW Contact:    Candie Chroman, LCSW Phone Number: 01/20/2021, 4:01 PM  Clinical Narrative:   Patient is a PACE participant. Spoke to her PCP, Dionicia Abler. She said plan is for patient to return home tomorrow. They will pick her up around 11:00. Patient does not have her wheelchair in the room so asked them to bring one with them. Patient will resume current PACE services at discharge.               Expected Discharge Plan: [place] Barriers to Discharge: Continued Medical Work up   Patient Goals and CMS Choice        Expected Discharge Plan and Services Expected Discharge Plan: [place] Acute Care Choice: Resumption of Svcs/PTA Provider Living arrangements for the past 2 months: Apartment                                      Prior Living Arrangements/Services Living arrangements for the past 2 months: Apartment Lives with:: Self Patient language and need for interpreter reviewed:: Yes Do you feel safe going back to the place where you live?: Yes      Need for Family Participation in Patient Care: Yes (Comment) Care giver support system in place?: Yes (comment) Current home services: DME, Homehealth aide Criminal Activity/Legal Involvement Pertinent to Current Situation/Hospitalization: No - Comment as needed  Activities of Daily Living Home Assistive Devices/Equipment: Wheelchair ADL Screening (condition at time of admission) Patient's cognitive ability adequate to safely complete daily activities?: No Is the patient deaf or have difficulty hearing?: No Does the patient have difficulty seeing, even when wearing glasses/contacts?: No Does the patient have difficulty concentrating, remembering, or making decisions?: No Patient able to express need  for assistance with ADLs?: Yes Does the patient have difficulty dressing or bathing?: Yes Independently performs ADLs?: No Communication: Independent Dressing (OT): Needs assistance Is this a change from baseline?: Pre-admission baseline Grooming: Needs assistance Is this a change from baseline?: Pre-admission baseline Feeding: Independent Bathing: Needs assistance Is this a change from baseline?: Pre-admission baseline Toileting: Needs assistance Is this a change from baseline?: Pre-admission baseline In/Out Bed: Needs assistance Is this a change from baseline?: Pre-admission baseline Walks in Home: Dependent Is this a change from baseline?: Pre-admission baseline Does the patient have difficulty walking or climbing stairs?: Yes Weakness of Legs: Both Weakness of Arms/Hands: None  Permission Sought/Granted Permission sought to share information with : Facility Arts administrator granted to share info w AGENCY: PACE        Emotional Assessment Appearance:: Appears stated age Attitude/Demeanor/Rapport: Unable to Assess Affect (typically observed): Unable to Assess Orientation: : Oriented to Self, Oriented to Place, Oriented to  Time, Oriented to Situation Alcohol / Substance Use: Not Applicable Psych Involvement: No (comment)  Admission diagnosis:  Lactic acidosis [E87.20] COPD exacerbation (HCC) [J44.1] Atrial fibrillation with RVR (HCC) [I48.91] Acute hypoxemic respiratory failure (HCC) [J96.01] Congestive heart failure, unspecified HF chronicity, unspecified heart failure type ([place]) [I50.9] Sepsis, due to unspecified organism, unspecified whether acute organ dysfunction present [facility]) [A41.9] Patient Active Problem List   Diagnosis Date Noted   Edema    Pneumonia of  both lower lobes due to infectious organism    Acute hypoxemic respiratory failure ([place]) 11/16/2020   Hypokalemia    Lactic acidosis    Weakness    Chronic a-fib (HCC)    Diabetes  mellitus without complication ([place]) 36/64/4034   GERD (gastroesophageal reflux disease) 09/19/2019   Fall 09/19/2019   COPD exacerbation ([place]) 08/14/2019   Acute on chronic respiratory failure with hypoxia ([place]) 08/13/2019   Hypothyroidism 08/13/2019   History of CVA (cerebrovascular accident) 08/13/2019   Palliative care by specialist    Goals of care, counseling/discussion    Acute delirium    Atrial fibrillation with rapid ventricular response (HCC)    Anxiety    Chronic heart failure with preserved ejection fraction (HFpEF) ([place])    Pulmonary hypertension, unspecified ([place])    Acute on chronic respiratory failure with hypoxemia ([place]) 02/03/2018   Pulmonary emphysema ([place]) 04/25/2015   Emphysema of lung ([place]) 05/09/2014   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   Hyperlipidemia 05/09/2014   GI bleed    Benign neoplasm of colon 03/03/2014   Diverticulosis of colon without hemorrhage 03/03/2014   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   Jejunal ulcer 03/03/2014   Gastric polyps 03/03/2014   Acute posthemorrhagic anemia 03/02/2014   Melena 02/27/2014   LGI bleed    Acute on chronic respiratory failure ([place]) 02/16/2014   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   Aspiration pneumonitis ([place])    Spontaneous pneumothorax    Thrush    COPD with acute exacerbation (HCC)    SOB (shortness of breath)    HLD (hyperlipidemia)    Essential hypertension    Dysphagia, pharyngoesophageal phase    Acute tracheobronchitis 02/04/2014   Atelectasis of left lung 02/04/2014   Wheezing    Acute respiratory failure (HCC)    Paroxysmal A-fib (HCC)    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)    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[place])    Stroke ([place]) 01/29/2014   Respiratory failure ([place]) 01/29/2014   PCP:  [place]:   [place] [facility] [place], [place] [address] Phone: [phone] Fax: [phone]  [place] [address], [place] - [place] [address] Phone: [phone] Fax: [place], [place] [address] Phone: [phone] Fax: [phone]     Social Determinants of Health (SDOH) Interventions    Readmission Risk Interventions Readmission Risk Prevention Plan 11/18/2020 09/21/2019 09/20/2019  Transportation Screening Complete Complete Complete  PCP or Specialist Appt within 3-5 Days Complete Complete Complete  HRI or Home Care Consult Complete Complete Complete  Social Work Consult for [place] Planning/Counseling Complete - -  Palliative Care Screening Not Applicable Not Applicable Not Applicable  Medication Review (RN Care Manager) Complete Complete Complete  Some recent data might be hidden

## 2021-01-20 NOTE — Progress Notes (Signed)
PROGRESS NOTE  Summer Bradley  DOB: [birth year]  PCP: [place] [id number]  DOA: 01/18/2021  LOS: 2 days  Hospital Day: 3  Chief Complaint  Patient presents with   Shortness of Breath    C/o SOB since this afternoon when pt. Was walking to bathroom. Per medic, pt. Denied any medication or interventions. Medic states pt. HR and O2 sat return to normal when she calms down, but when she gets agitated, or exertion.    Brief narrative: Summer Bradley is a 75 y.o. female with PMH significant for DM2, HTN, HLD, chronic diastolic CHF, A. fib, COPD on 3L O2 at home, pulmonary hypertension.   Patient presented to the ED on 12/3 for sudden onset shortness of breath. She has chronic cough, denies any fever, chest pain, lower extremity edema, recent change in medications.  In the ED, patient was afebrile, heart rate elevated to 160s, blood pressure elevated to 189/123, initially required up to 5l oxygen by nasal cannula to maintain saturation over 90%.  She was felt like she was having a panic attack. CBC and CMP unremarkable, troponin normal.  BNP 265 at baseline. COVID and influenza PCR negative.  Procalcitonin level low Blood gas with pH 7.41, PCO2 43, HCO3 27. Lactic acid level was however elevated to 4.1. Chest x-ray did not show any evidence of pulm edema EKG showed A. fib with RVR but no acute ischemic changes.  Patient was treated in the ED with methylprednisolone, nebulizers, IV Lasix, also started on Cardizem drip Admitted to hospital service.  Subjective: Patient was seen and examined this morning.   Lying down in bed.  Very anxious.  She is asking for someone to be available all the time in her room to hold her hands.  She continues to state  'I cannot breathe' while she is maintaining oxygen saturation at 95% on 4 L which is very close to her home requirement.  Because of tachypnea, she has diminished air entry in both bases but otherwise clear to  auscultation.    Assessment/Plan: Acute on chronic respiratory failure with hypoxia Acute COPD exacerbation -Patient with sudden onset shortness of breath without fever, volume overload -Chest x-ray unremarkable labs unremarkable -Suspect if patient had any mucous plugging with later resolved in the ED probably also exacerbated by anxiety. -On admission, she was started on a short course of prednisone and doxycycline.  I will continue the same. -Continue bronchodilators.  Severe anxiety episode -Patient has history of anxiety disorder and takes BuSpar, Celexa, Seroquel and trazodone at home. -Her feeling of shortness of breath at this time is mostly anxiety.  I increased Ativan to 1 mg every 4 hours today.  Continue to monitor.  A. fib with RVR -History of A. fib on bisoprolol, Cardizem and Eliquis at home. -Required Cardizem drip on admission. -Home meds resumed.  Chronic diastolic CHF -Home meds include bisoprolol, Lasix, and metolazone with potassium supplement -Continue all except potassium supplement as her potassium level is elevated this morning. Recent Labs  Lab 01/18/21 1227 01/19/21 0436 01/20/21 0606  K 4.2 5.2* 4.2    Lactic acidosis -Lactic acid level was elevated as high as 6.4 without any evidence of infection.  May be contributed by hypoperfusion due to tachycardia. -Trending down.  Type 2 diabetes mellitus with hyperglycemia -A1c 6 on 11/17/2020 -Diet controlled.  Blood sugar level was elevated on admission probably because of anxiety and the use of steroids -Currently on sliding scale insulin with Accu-Cheks Recent Labs  Lab 01/19/21 1207 01/19/21 1637 01/19/21 2148 01/20/21 0821 01/20/21 1136  GLUCAP 176* 159* 125* 88 126*    Hypothyroidism -continue Synthroid  GERD -continue famotidine   Mobility: Limited mobility.  Uses walker at home.  May benefit from PT eval Living condition: Lives alone Goals of care:   Code Status: DNR  Nutritional  status: There is no height or weight on file to calculate BMI.      Diet:  Diet Order             Diet heart healthy/carb modified Room service appropriate? Yes; Fluid consistency: Thin  Diet effective now                  DVT prophylaxis:   apixaban (ELIQUIS) tablet 5 mg   Antimicrobials: Doxycycline Fluid: Not on IV fluid Consultants: None Family Communication: Called and updated patient's daughter Ms. [name] this afternoon  Status is: Inpatient  Continue in-hospital care because: Continues to complain of significant shortness of breath Level of care: Med-Surg   Dispo: The patient is from: Home              Anticipated d/c is to: Home versus SNF.  Pending PT eval              Patient currently is not medically stable to d/c.   Difficult to place patient No     Infusions:     Scheduled Meds:  apixaban  5 mg Oral BID   bisoprolol  2.5 mg Oral BID   busPIRone  15 mg Oral BID   citalopram  40 mg Oral QHS   diltiazem  300 mg Oral Daily   doxycycline  100 mg Oral Q12H   famotidine  20 mg Oral Daily   fluticasone furoate-vilanterol  1 puff Inhalation Daily   furosemide  40 mg Oral Daily   insulin aspart  0-5 Units Subcutaneous QHS   insulin aspart  0-9 Units Subcutaneous TID WC   levothyroxine  200 mcg Oral Daily   predniSONE  40 mg Oral Q breakfast   QUEtiapine  50 mg Oral QHS   tiotropium  1 capsule Inhalation Daily    PRN meds: acetaminophen **OR** acetaminophen, albuterol, LORazepam, ondansetron **OR** ondansetron (ZOFRAN) IV, oxyCODONE, polyethylene glycol   Antimicrobials: Anti-infectives (From admission, onward)    Start     Dose/Rate Route Frequency Ordered Stop   01/18/21 1800  doxycycline (VIBRA-TABS) tablet 100 mg        100 mg Oral Every 12 hours 01/18/21 1710 01/23/21 2159   01/18/21 1415  cefTRIAXone (ROCEPHIN) 1 g in sodium chloride 0.9 % 100 mL IVPB        1 g 200 mL/hr over 30 Minutes Intravenous  Once 01/18/21 1400 01/18/21 1644    01/18/21 1415  azithromycin (ZITHROMAX) 500 mg in sodium chloride 0.9 % 250 mL IVPB  Status:  Discontinued        500 mg 250 mL/hr over 60 Minutes Intravenous  Once 01/18/21 1400 01/18/21 1717       Objective: Vitals:   01/20/21 1145 01/20/21 1200  BP: 134/84   Pulse: (!) 108 100  Resp: (!) 39 (!) 21  Temp: 98.6 F (37 C)   SpO2: 94%     Intake/Output Summary (Last 24 hours) at 01/20/2021 1239 Last data filed at 01/20/2021 0715 Gross per 24 hour  Intake 380 ml  Output 500 ml  Net -120 ml    There were no vitals filed for this  visit. Weight change:  There is no height or weight on file to calculate BMI.   Physical Exam: General exam: Pleasant, elderly anxious Caucasian female.  Anxious, holding staff's hands. Skin: No rashes, lesions or ulcers. HEENT: Atraumatic, normocephalic, no obvious bleeding Lungs: Diminished air entry in both bases probably because of poor respiratory effort and tachypnea CVS: Tachycardic with anxiety attack, no murmur GI/Abd soft, nontender, nondistended, bowel sound present CNS: Alert, awake, oriented to place Psychiatry: Anxious Extremities: No pedal edema, no calf tenderness  Data Review: I have personally reviewed the laboratory data and studies available.  F/u labs ordered Unresulted Labs (From admission, onward)     Start     Ordered   01/21/21 0500  CBC with Differential/Platelet  Tomorrow morning,   R       Question:  Specimen collection method  Answer:  Lab=Lab collect   01/20/21 1239   01/21/21 0500  Lactic acid, plasma  Tomorrow morning,   R       Question:  Specimen collection method  Answer:  Lab=Lab collect   01/20/21 1239            Signed, Terrilee Croak, MD [facility] 01/20/2021

## 2021-01-20 NOTE — Progress Notes (Signed)
Vital signs were checked by nursing student at [facility]. Hr 107 and RR 36. Showing a red MEWS score. Patient was not in a resting state during the time vitals were checked. Patient is very anxious and has been since admission. MD contacted and notified, this nurse asked if we could change her anxiety PRN medication to be given more frequent. Student nurse and I helped to calm the patient by instructing her to take deep breaths, we scooted her up in the bed and positioned the bed in an upright position. Took patient a few minutes to settle down after she was back to a resting state the vitals were retaken at 0830 HR 100 and RR 22.

## 2021-01-20 NOTE — Evaluation (Signed)
Physical Therapy Evaluation Patient Details Name: Summer Bradley MRN: [id number] DOB: Feb 12, 1946 Today's Date: 01/20/2021  History of Present Illness  Patient is a 75 year old female with medical history significant for  DM2, HTN, HLD, chronic diastolic CHF, A. fib, COPD on 3L O2 at home, pulmonary hypertension. She presents to the ED for sudden onset of shortness of breath. Found to have Acute on chronic respiratory failure with hypoxia, acute COPD exacerbation, A.fib with RVR   Clinical Impression  Patient is anxious throughout session which limits progression of activity. She required constant cues for breathing techniques, for slow deep breathing, energy conservation tips, and reassurance with mobility efforts. She was able to get out of bed to the bed side commode and back to bed with minimal assistance for steadying. Unable to safely attempt walking. Patient does appear more anxious during mobility efforts. Sp02 90% on 4 L02 and heart rate 111bpm after return to bed. She reports she is in the [facility] and would prefer to go home and continue with this service. Anticipate patient will be able to return home if she has assistance with mobility. If not, she will need SNF placement. PT will continue to follow to maximize independence and facilitate return to prior level of function (minimal ambulation at baseline her her report).      Recommendations for follow up therapy are one component of a multi-disciplinary discharge planning process, led by the attending physician.  Recommendations may be updated based on patient status, additional functional criteria and insurance authorization.  Follow Up Recommendations Home health PT     Assistance Recommended at Discharge Intermittent Supervision/Assistance  Functional Status Assessment Patient has had a recent decline in their functional status and demonstrates the ability to make significant improvements in function in a reasonable and  predictable amount of time.  Equipment Recommendations  None recommended by PT    Recommendations for Other Services       Precautions / Restrictions Precautions Precautions: Fall Restrictions Weight Bearing Restrictions: No      Mobility  Bed Mobility Overal bed mobility: Needs Assistance Bed Mobility: Sit to Supine;Supine to Sit     Supine to sit: Min assist;HOB elevated Sit to supine: Min assist;HOB elevated   General bed mobility comments: verbal cues for safety and task initiation    Transfers Overall transfer level: Needs assistance Equipment used: None Transfers: Bed to chair/wheelchair/BSC   Stand pivot transfers: Min assist         General transfer comment: steadying assistance provided with pivot transfer to and from bed side commode.    Ambulation/Gait               General Gait Details: unable to due to anxiety, need for rest breaks, fatigue with minimal activity  Stairs            Wheelchair Mobility    Modified Rankin (Stroke Patients Only)       Balance Overall balance assessment: Needs assistance Sitting-balance support: Feet supported Sitting balance-Leahy Scale: Fair Sitting balance - Comments: no loss of balance in sitting position   Standing balance support: No upper extremity supported Standing balance-Leahy Scale: Poor Standing balance comment: Min A required to maintain standing balance with poor standing tolerance                             Pertinent Vitals/Pain Pain Assessment: No/denies pain    Home Living Family/patient expects to be discharged  to:: Private residence Living Arrangements: Alone Available Help at Discharge: Personal care attendant Type of Home: Apartment Home Access: [place]: One level Home Equipment: Conservation officer, nature (2 wheels);Wheelchair - manual      Prior Function Prior Level of Function : Patient poor historian/Family not available;Needs assist        Physical Assist : Mobility (physical);ADLs (physical) Mobility (physical): Bed mobility;Transfers;Gait ADLs (physical): Bathing;Dressing Mobility Comments: [facility] 5 days/week & PCA assists her with dressing & getting in/out of bed. ambulation is very limited per patient report       Hand Dominance        Extremity/Trunk Assessment   Upper Extremity Assessment Upper Extremity Assessment: Generalized weakness    Lower Extremity Assessment Lower Extremity Assessment: Generalized weakness       Communication   Communication: No difficulties  Cognition Arousal/Alertness: Awake/alert Behavior During Therapy: Anxious Overall Cognitive Status: Difficult to assess                                          General Comments General comments (skin integrity, edema, etc.): increased time and effort required with all mobility efforts. patient is anxious throughout session and needs encourgaement and reassurance most of the time. maximal visual and verbal cues provided for breathing techniques and tips for energy conservation. anxiety seems to limit progression of further activity. increased breathing rate also noted, Sp02 90% on 4 L after mobilizing with heart rate 111bpm    Exercises     Assessment/Plan    PT Assessment Patient needs continued PT services  PT Problem List Decreased strength;Decreased activity tolerance;Decreased balance;Decreased mobility;Decreased cognition;Decreased safety awareness;Decreased knowledge of use of DME;Cardiopulmonary status limiting activity       PT Treatment Interventions DME instruction;Gait training;Functional mobility training;Therapeutic activities;Therapeutic exercise;Balance training;Neuromuscular re-education;Cognitive remediation;Patient/family education;Wheelchair mobility training    PT Goals (Current goals can be found in the Care Plan section)  Acute Rehab PT Goals Patient Stated Goal: to return home PT Goal  Formulation: With patient Time For Goal Achievement: 02/03/21 Potential to Achieve Goals: Fair    Frequency Min 2X/week   Barriers to discharge Decreased caregiver support      Co-evaluation               AM-PAC PT "6 Clicks" Mobility  Outcome Measure Help needed turning from your back to your side while in a flat bed without using bedrails?: A Little Help needed moving from lying on your back to sitting on the side of a flat bed without using bedrails?: A Little Help needed moving to and from a bed to a chair (including a wheelchair)?: A Little Help needed standing up from a chair using your arms (e.g., wheelchair or bedside chair)?: A Little Help needed to walk in hospital room?: A Lot Help needed climbing 3-5 steps with a railing? : A Lot 6 Click Score: 16    End of Session Equipment Utilized During Treatment: Oxygen Activity Tolerance: Treatment limited secondary to medical complications (Comment) (anxiety) Patient left: in bed;with call bell/phone within reach;with bed alarm set;with nursing/sitter in room (nursing student and nurse in the room) Nurse Communication: Mobility status PT Visit Diagnosis: Unsteadiness on feet (R26.81);Muscle weakness (generalized) (M62.81)    Time: 2130-8657 PT Time Calculation (min) (ACUTE ONLY): 41 min   Charges:   PT Evaluation $PT Eval Moderate Complexity: 1 Mod PT  Treatments $Therapeutic Activity: 23-37 mins        Minna Merritts, PT, MPT   Percell Locus 01/20/2021, 12:02 PM

## 2021-01-20 NOTE — Plan of Care (Signed)
  Problem: Clinical Measurements: Goal: Ability to maintain clinical measurements within normal limits will improve Outcome: Progressing Goal: Will remain free from infection Outcome: Progressing Goal: Diagnostic test results will improve Outcome: Progressing Goal: Respiratory complications will improve Outcome: Progressing Goal: Cardiovascular complication will be avoided Outcome: Progressing   Problem: Pain Managment: Goal: General experience of comfort will improve Outcome: Progressing   Pt is very anxious and panicky. Pt appears tachypneic and short of breath. Ativan and seroquel utilized. Still on oxygen at 4lpm/Ong with oxygen sats at 95-96%. Breathing tx given.

## 2021-01-21 LAB — CBC WITH DIFFERENTIAL/PLATELET
Abs Immature Granulocytes: 0.13 10*3/uL — ABNORMAL HIGH (ref 0.00–0.07)
Basophils Absolute: 0 10*3/uL (ref 0.0–0.1)
Basophils Relative: 0 %
Eosinophils Absolute: 0 10*3/uL (ref 0.0–0.5)
Eosinophils Relative: 0 %
HCT: 35.2 % — ABNORMAL LOW (ref 36.0–46.0)
Hemoglobin: 10.6 g/dL — ABNORMAL LOW (ref 12.0–15.0)
Immature Granulocytes: 1 %
Lymphocytes Relative: 15 %
Lymphs Abs: 2.5 10*3/uL (ref 0.7–4.0)
MCH: 23.8 pg — ABNORMAL LOW (ref 26.0–34.0)
MCHC: 30.1 g/dL (ref 30.0–36.0)
MCV: 79.1 fL — ABNORMAL LOW (ref 80.0–100.0)
Monocytes Absolute: 1.6 10*3/uL — ABNORMAL HIGH (ref 0.1–1.0)
Monocytes Relative: 10 %
Neutro Abs: 11.8 10*3/uL — ABNORMAL HIGH (ref 1.7–7.7)
Neutrophils Relative %: 74 %
Platelets: 379 10*3/uL (ref 150–400)
RBC: 4.45 MIL/uL (ref 3.87–5.11)
RDW: 19.9 % — ABNORMAL HIGH (ref 11.5–15.5)
WBC: 16.1 10*3/uL — ABNORMAL HIGH (ref 4.0–10.5)
nRBC: 0.4 % — ABNORMAL HIGH (ref 0.0–0.2)

## 2021-01-21 LAB — GLUCOSE, CAPILLARY
Glucose-Capillary: 90 mg/dL (ref 70–99)
Glucose-Capillary: 109 mg/dL — ABNORMAL HIGH (ref 70–99)

## 2021-01-21 LAB — LACTIC ACID, PLASMA: Lactic Acid, Venous: 1.3 mmol/L (ref 0.5–1.9)

## 2021-01-21 NOTE — Care Management Important Message (Signed)
Important Message  Patient Details  Name: Summer Bradley MRN: [id number] Date of Birth: October 04, 1945   Medicare Important Message Given:  Yes  Patient asleep.  Copy of Medicare IM left on patient's beside tray for reference.    [name] 01/21/2021, 12:17 PM

## 2021-01-21 NOTE — Discharge Summary (Signed)
Physician Discharge Summary  Summer Bradley PFX:[id number] DOB: March 24, 1945 DOA: 01/18/2021  PCP: [place] date: 01/18/2021 Discharge date: 01/21/2021  Admitted From: Home Discharge disposition: Home   Code Status: DNR   Discharge Diagnosis:   Principal Problem:   Acute hypoxemic respiratory failure ([place]) Active Problems:   Stroke ([place])   Paroxysmal A-fib ([place])   HLD (hyperlipidemia)   Essential hypertension   COPD with acute exacerbation ([place])   Chronic heart failure with preserved ejection fraction (HFpEF) ([place])   Pulmonary hypertension, unspecified ([place])   Anxiety   Diabetes mellitus without complication ([place])   Lactic acidosis    Chief Complaint  Patient presents with   Shortness of Breath    C/o SOB since this afternoon when pt. Was walking to bathroom. Per medic, pt. Denied any medication or interventions. Medic states pt. HR and O2 sat return to normal when she calms down, but when she gets agitated, or exertion.    Brief narrative: Summer Bradley is a 75 y.o. female with PMH significant for DM2, HTN, HLD, chronic diastolic CHF, A. fib, COPD on 3L O2 at home, pulmonary hypertension.   Patient presented to the ED on 12/3 for sudden onset shortness of breath. She has chronic cough, denies any fever, chest pain, lower extremity edema, recent change in medications.  In the ED, patient was afebrile, heart rate elevated to 160s, blood pressure elevated to 189/123, initially required up to 5l oxygen by nasal cannula to maintain saturation over 90%.  She was felt like she was having a panic attack. CBC and CMP unremarkable, troponin normal.  BNP 265 at baseline. COVID and influenza PCR negative.  Procalcitonin level low Blood gas with pH 7.41, PCO2 43, HCO3 27. Lactic acid level was however elevated to 4.1. Chest x-ray did not show any evidence of pulm edema EKG showed A. fib with RVR but no acute ischemic changes.  Patient was treated  in the ED with methylprednisolone, nebulizers, IV Lasix, also started on Cardizem drip Admitted to hospital service.  Subjective: Patient was seen and examined this morning.   Lying on bed.  Not in distress.  Not anxious as yesterday.  On 3 L oxygen by nasal cannula, maintain saturation more than 95%.  Hospital course: Acute on chronic respiratory failure with hypoxia Acute COPD exacerbation -Patient with sudden onset shortness of breath without fever, volume overload -Chest x-ray and labs were unremarkable. -Suspect if patient had any mucous plugging which later resolved in the ED. Patient has a huge degree of anxiety which probably amplified her symptoms. -On admission, she was started on a short course of prednisone and doxycycline.  She completed 3-day course of that. -Continue bronchodilators post discharge.  Severe anxiety episode -Patient has history of anxiety disorder and takes Ativan, BuSpar, Celexa, Seroquel and trazodone at home. -Her feeling of shortness of breath at this time is mostly due to anxiety.  I discussed with patient's PCP at [facility] yesterday.  She stated that patient always is anxious and always states 'I can't breathe.' even when her O2 sat is over 95%.  Patient will continue her home meds postdischarge.  A. fib with RVR -History of A. fib on bisoprolol, Cardizem and Eliquis at home. -Required Cardizem drip on admission.  Heart rate controlled after home meds were resumed.  Continue the same.  Chronic diastolic CHF -Home meds include bisoprolol, Lasix, and metolazone with potassium supplement -Continue the same.  Lactic acidosis -Lactic acid level was elevated  as high as 6.4 without any evidence of infection.  May be contributed by hypoperfusion due to tachycardia. -Trended down, normal this morning. Recent Labs  Lab 01/18/21 1227 01/18/21 1229 01/19/21 0227 01/19/21 0436 01/21/21 0509  LATICACIDVEN  --    < > 3.9* 3.2* 1.3  PROCALCITON <0.10  --   --    --   --    < > = values in this interval not displayed.   Type 2 diabetes mellitus with hyperglycemia -A1c 6 on 11/17/2020 -Diet controlled.  Blood sugar level was elevated on admission probably because of anxiety and the use of steroids.  No need of insulin or oral hypoglycemic at discharge.  Hypothyroidism -continue Synthroid  GERD -continue famotidine  Mobility: Limited mobility.  Uses walker at home.  May benefit from PT eval Living condition: Lives alone Goals of care:   Code Status: DNR  Nutritional status: Body mass index is 34.11 kg/m.   Discharge Medications:   Allergies as of 01/21/2021       Reactions   Lisinopril Cough        Medication List     TAKE these medications    albuterol 108 (90 Base) MCG/ACT inhaler Commonly known as: VENTOLIN HFA Inhale 2 puffs into the lungs every 4 (four) hours as needed for wheezing or shortness of breath.   albuterol (2.5 MG/3ML) 0.083% nebulizer solution Commonly known as: PROVENTIL Take 2.5 mg by nebulization every 6 (six) hours as needed for wheezing or shortness of breath.   alendronate 70 MG tablet Commonly known as: FOSAMAX Take 70 mg by mouth once a week.   apixaban 5 MG Tabs tablet Commonly known as: ELIQUIS Take 1 tablet (5 mg total) by mouth 2 (two) times daily.   bisoprolol 5 MG tablet Commonly known as: ZEBETA Take 0.5 tablets (2.5 mg total) by mouth 2 (two) times daily.   Breo Ellipta 200-25 MCG/ACT Aepb Generic drug: fluticasone furoate-vilanterol Inhale 1 puff into the lungs daily.   budesonide-formoterol 160-4.5 MCG/ACT inhaler Commonly known as: SYMBICORT Inhale 2 puffs into the lungs 2 (two) times daily.   busPIRone 15 MG tablet Commonly known as: BUSPAR Take 15 mg by mouth 2 (two) times daily.   Calcium Carbonate-Vitamin D3 600-400 MG-UNIT Tabs Take 1 tablet by mouth 2 (two) times daily.   citalopram 40 MG tablet Commonly known as: CELEXA Take 40 mg by mouth at bedtime.   Combivent  Respimat 20-100 MCG/ACT Aers respimat Generic drug: Ipratropium-Albuterol Inhale 2 puffs into the lungs daily.   diltiazem 300 MG 24 hr capsule Commonly known as: CARDIZEM CD Take 1 capsule (300 mg total) by mouth daily.   docusate sodium 100 MG capsule Commonly known as: COLACE Take 100 mg by mouth daily.   EUCERIN EX Apply 1 application topically 2 (two) times daily as needed.   famotidine 20 MG tablet Commonly known as: PEPCID Take 1 tablet (20 mg total) by mouth daily.   furosemide 40 MG tablet Commonly known as: LASIX Take 40 mg by mouth daily.   levothyroxine 200 MCG tablet Commonly known as: SYNTHROID Take 200 mcg by mouth daily.   LORazepam 0.5 MG tablet Commonly known as: ATIVAN Take 0.5 mg by mouth 2 (two) times daily. What changed: Another medication with the same name was removed. Continue taking this medication, and follow the directions you see here.   metolazone 2.5 MG tablet Commonly known as: ZAROXOLYN Take 2.5 mg by mouth once a week. On Wednesdays with furosemide in the  am   PARoxetine 20 MG tablet Commonly known as: PAXIL Take 20 mg by mouth daily.   polyethylene glycol 17 g packet Commonly known as: MIRALAX / GLYCOLAX Take 17 g by mouth daily as needed.   potassium chloride SA 20 MEQ tablet Commonly known as: KLOR-CON M Take 2 tablets (40 mEq total) by mouth 2 (two) times daily.   predniSONE 10 MG tablet Commonly known as: DELTASONE 4 tabs po day 1; 3 tabs po day2,3; 2 tabs po day4,5; 1 tab po day 6,7; 1/2 tab po day8,9   predniSONE 5 MG tablet Commonly known as: DELTASONE Take 5 mg by mouth daily.   QUEtiapine 25 MG tablet Commonly known as: SEROQUEL Take 1 tablet (25 mg total) by mouth at bedtime as needed.   senna 8.6 MG Tabs tablet Commonly known as: SENOKOT Take 1 tablet by mouth daily as needed.   SYSTANE ULTRA OP Place 1 drop into both eyes every 4 (four) hours as needed (dry eyes).   Tiotropium Bromide Monohydrate 2.5  MCG/ACT Aers Inhale 1 puff into the lungs daily.               Discharge Care Instructions  (From admission, onward)           Start     Ordered   01/21/21 0000  Discharge wound care:        01/21/21 1059            Wound care:   Wound / Incision (Open or Dehisced) 01/18/21 Non-pressure wound Knee Anterior;Left (Active)  Date First Assessed/Time First Assessed: 01/18/21 2001   Wound Type: Non-pressure wound  Location: Knee  Location Orientation: Anterior;Left  Present on Admission: Yes    Assessments 01/18/2021  6:28 PM 01/21/2021  8:24 AM  Dressing Type Foam - Lift dressing to assess site every shift Foam - Lift dressing to assess site every shift  Dressing Changed Changed --  Dressing Status -- Clean;Dry;Intact  Dressing Change Frequency -- PRN  Drainage Amount -- None     No Linked orders to display    Discharge Instructions:   Discharge Instructions     Call MD for:  difficulty breathing, headache or visual disturbances   Complete by: As directed    Call MD for:  extreme fatigue   Complete by: As directed    Call MD for:  hives   Complete by: As directed    Call MD for:  persistant dizziness or light-headedness   Complete by: As directed    Call MD for:  persistant nausea and vomiting   Complete by: As directed    Call MD for:  severe uncontrolled pain   Complete by: As directed    Call MD for:  temperature >100.4   Complete by: As directed    Diet general   Complete by: As directed    Discharge instructions   Complete by: As directed    General discharge instructions:  Follow with Primary MD [place] in 7 days   Get CBC/BMP checked in next visit within 1 week by PCP or SNF MD. (We routinely change or add medications that can affect your baseline labs and fluid status, therefore we recommend that you get the mentioned basic workup next visit with your PCP, your PCP may decide not to get them or add new tests based on their  clinical decision)  On your next visit with your PCP, please get your medicines reviewed and adjusted.  Please request your PCP  to go over all hospital tests, procedures, radiology results at the follow up, please get all Hospital records sent to your PCP by signing hospital release before you go home.  Activity: As tolerated with Full fall precautions use walker/cane & assistance as needed  Avoid using any recreational substances like cigarette, tobacco, alcohol, or non-prescribed drug.  If you experience worsening of your admission symptoms, develop shortness of breath, life threatening emergency, suicidal or homicidal thoughts you must seek medical attention immediately by calling 911 or calling your MD immediately  if symptoms less severe.  You must read complete instructions/literature along with all the possible adverse reactions/side effects for all the medicines you take and that have been prescribed to you. Take any new medicine only after you have completely understood and accepted all the possible adverse reactions/side effects.   Do not drive, operate heavy machinery, perform activities at heights, swimming or participation in water activities or provide baby sitting services if your were admitted for syncope or siezures until you have seen by Primary MD or a Neurologist and advised to do so again.  Do not drive when taking Pain medications.  Do not take more than prescribed Pain, Sleep and Anxiety Medications  Wear Seat belts while driving.  Please note You were cared for by a hospitalist during your hospital stay. If you have any questions about your discharge medications or the care you received while you were in the hospital after you are discharged, you can call the unit and asked to speak with the hospitalist on call if the hospitalist that took care of you is not available. Once you are discharged, your primary care physician will handle any further medical issues. Please  note that NO REFILLS for any discharge medications will be authorized once you are discharged, as it is imperative that you return to your primary care physician (or establish a relationship with a primary care physician if you do not have one) for your aftercare needs so that they can reassess your need for medications and monitor your lab values.   Discharge wound care:   Complete by: As directed    Increase activity slowly   Complete by: As directed        Follow ups:    Follow-up [place] Follow up.   Contact information: [place] [id number] [phone]                 Discharge Exam:   Vitals:   01/20/21 1700 01/20/21 2009 01/21/21 0446 01/21/21 0755  BP:  127/85 140/80 (!) 148/82  Pulse:  91 76 (!) 107  Resp:  18 20 19   Temp:  97.8 F (36.6 C) (!) 97.4 F (36.3 C) 98.3 F (36.8 C)  TempSrc:   Oral   SpO2:  98% 96% 97%  Height: 5\' 2"  (1.575 m)       Body mass index is 34.11 kg/m.  General exam: Pleasant, elderly anxious Caucasian female.  Anxiety better today. Skin: No rashes, lesions or ulcers. HEENT: Atraumatic, normocephalic, no obvious bleeding Lungs: Diminished air entry in both bases probably because of poor respiratory effort and tachypnea CVS: Heart rate controlled, no murmur GI/Abd soft, nontender, nondistended, bowel sound present CNS: Alert, awake, oriented to place Psychiatry: Anxious Extremities: No pedal edema, no calf tenderness  Time coordinating discharge: 35 minutes   The results of significant diagnostics from this hospitalization (including imaging, microbiology, ancillary  and laboratory) are listed below for reference.    Procedures and Diagnostic Studies:   DG Chest Portable 1 View  Result Date: 01/18/2021 CLINICAL DATA:  Shortness of breath EXAM: PORTABLE CHEST 1 VIEW COMPARISON:  Chest x-rays dated 11/16/2020 and 10/22/2020. FINDINGS: Stable cardiomegaly. Coarse lung  markings bilaterally. No confluent opacity to suggest a consolidating pneumonia. No pleural effusion or pneumothorax is seen. Osseous structures about the chest are unremarkable. IMPRESSION: 1. No active disease. No evidence of pneumonia or pulmonary edema. 2. Stable cardiomegaly. Electronically Signed   By: Franki Cabot M.D.   On: 01/18/2021 13:02     Labs:   Basic Metabolic Panel: Recent Labs  Lab 01/18/21 1227 01/19/21 0436 01/20/21 0606  NA 138 136 140  K 4.2 5.2* 4.2  CL 100 105 108  CO2 26 21* 25  GLUCOSE 181* 189* 113*  BUN 15 21 28*  CREATININE 0.83 0.89 0.73  CALCIUM 8.8* 7.7* 8.4*   GFR CrCl cannot be calculated (Unknown ideal weight.). Liver Function Tests: Recent Labs  Lab 01/18/21 1227 01/19/21 0436  AST 26 26  ALT 16 19  ALKPHOS 67 45  BILITOT 1.0 1.0  PROT 7.1 5.7*  ALBUMIN 3.6 3.0*   No results for input(s): LIPASE, AMYLASE in the last 168 hours. No results for input(s): AMMONIA in the last 168 hours. Coagulation profile No results for input(s): INR, PROTIME in the last 168 hours.  CBC: Recent Labs  Lab 01/18/21 1227 01/19/21 0436 01/20/21 0606 01/21/21 0509  WBC 15.7* 18.0* 23.2* 16.1*  NEUTROABS 12.3*  --  19.2* 11.8*  HGB 11.4* 10.5* 10.3* 10.6*  HCT 38.3 35.7* 34.5* 35.2*  MCV 80.8 81.3 80.0 79.1*  PLT 403* 362 398 379   Cardiac Enzymes: No results for input(s): CKTOTAL, CKMB, CKMBINDEX, TROPONINI in the last 168 hours. BNP: Invalid input(s): POCBNP CBG: Recent Labs  Lab 01/20/21 1136 01/20/21 1650 01/20/21 2048 01/21/21 0531 01/21/21 0754  GLUCAP 126* 167* 137* 90 109*   D-Dimer No results for input(s): DDIMER in the last 72 hours. Hgb A1c No results for input(s): HGBA1C in the last 72 hours. Lipid Profile No results for input(s): CHOL, HDL, LDLCALC, TRIG, CHOLHDL, LDLDIRECT in the last 72 hours. Thyroid function studies No results for input(s): TSH, T4TOTAL, T3FREE, THYROIDAB in the last 72 hours.  Invalid input(s):  FREET3 Anemia work up No results for input(s): VITAMINB12, FOLATE, FERRITIN, TIBC, IRON, RETICCTPCT in the last 72 hours. Microbiology Recent Results (from the past 240 hour(s))  Resp Panel by RT-PCR (Flu A&B, Covid) Nasopharyngeal Swab     Status: None   Collection Time: 01/18/21 12:29 PM   Specimen: Nasopharyngeal Swab; Nasopharyngeal(NP) swabs in vial transport medium  Result Value Ref Range Status   SARS Coronavirus 2 by RT PCR NEGATIVE NEGATIVE Final    Comment: (NOTE) SARS-CoV-2 target nucleic acids are NOT DETECTED.  The SARS-CoV-2 RNA is generally detectable in upper respiratory specimens during the acute phase of infection. The lowest concentration of SARS-CoV-2 viral copies this assay can detect is 138 copies/mL. A negative result does not preclude SARS-Cov-2 infection and should not be used as the sole basis for treatment or other patient management decisions. A negative result may occur with  improper specimen collection/handling, submission of specimen other than nasopharyngeal swab, presence of viral mutation(s) within the areas targeted by this assay, and inadequate number of viral copies(<138 copies/mL). A negative result must be combined with clinical observations, patient history, and epidemiological information. The expected result is Negative.  Fact Sheet for Patients:  EntrepreneurPulse.com.au  Fact Sheet for Healthcare Providers:  IncredibleEmployment.be  This test is no t yet approved or cleared by the [place] FDA and  has been authorized for detection and/or diagnosis of SARS-CoV-2 by FDA under an Emergency Use Authorization (EUA). This EUA will remain  in effect (meaning this test can be used) for the duration of the COVID-19 declaration under Section 564(b)(1) of the Act, 21 U.S.C.section 360bbb-3(b)(1), unless the authorization is terminated  or revoked sooner.       Influenza A by PCR NEGATIVE NEGATIVE  Final   Influenza B by PCR NEGATIVE NEGATIVE Final    Comment: (NOTE) The Xpert Xpress SARS-CoV-2/FLU/RSV plus assay is intended as an aid in the diagnosis of influenza from Nasopharyngeal swab specimens and should not be used as a sole basis for treatment. Nasal washings and aspirates are unacceptable for Xpert Xpress SARS-CoV-2/FLU/RSV testing.  Fact Sheet for Patients: EntrepreneurPulse.com.au  Fact Sheet for Healthcare Providers: IncredibleEmployment.be  This test is not yet approved or cleared by the [place] FDA and has been authorized for detection and/or diagnosis of SARS-CoV-2 by FDA under an Emergency Use Authorization (EUA). This EUA will remain in effect (meaning this test can be used) for the duration of the COVID-19 declaration under Section 564(b)(1) of the Act, 21 U.S.C. section 360bbb-3(b)(1), unless the authorization is terminated or revoked.  Performed at [facility], [address] [place], [place] [address]   Blood Culture (routine x 2)     Status: None (Preliminary result)   Collection Time: 01/18/21  3:59 PM   Specimen: BLOOD  Result Value Ref Range Status   Specimen Description BLOOD BLOOD RIGHT WRIST  Final   Special Requests   Final    BOTTLES DRAWN AEROBIC AND ANAEROBIC Blood Culture results may not be optimal due to an excessive volume of blood received in culture bottles   Culture   Final    NO GROWTH 3 DAYS Performed at [facility], [address] [place], [place] [address]    Report Status PENDING  Incomplete  Blood Culture (routine x 2)     Status: None (Preliminary result)   Collection Time: 01/18/21  4:11 PM   Specimen: BLOOD  Result Value Ref Range Status   Specimen Description BLOOD BLOOD RIGHT WRIST  Final   Special Requests   Final    BOTTLES DRAWN AEROBIC AND ANAEROBIC Blood Culture results may not be optimal due to an excessive volume of blood received in culture bottles    Culture   Final    NO GROWTH 3 DAYS Performed at [facility], [address] [place],  [address]    Report Status PENDING  Incomplete     Signed: Terrilee Croak  [facility] 01/21/2021, 12:00 PM

## 2021-01-23 ENCOUNTER — Emergency Department: Payer: Medicare (Managed Care)

## 2021-01-23 ENCOUNTER — Emergency Department
Admission: EM | Admit: 2021-01-23 | Discharge: 2021-01-24 | Disposition: A | Discharge status: Home / Self Care | Payer: Medicare (Managed Care) | Attending: Emergency Medicine | Admitting: Emergency Medicine

## 2021-01-23 ENCOUNTER — Other Ambulatory Visit: Payer: Self-pay

## 2021-01-23 DIAGNOSIS — E119 Type 2 diabetes mellitus without complications: Secondary | ICD-10-CM | POA: Insufficient documentation

## 2021-01-23 DIAGNOSIS — R0602 Shortness of breath: Secondary | ICD-10-CM | POA: Diagnosis present

## 2021-01-23 DIAGNOSIS — I10 Essential (primary) hypertension: Secondary | ICD-10-CM | POA: Insufficient documentation

## 2021-01-23 DIAGNOSIS — Z7951 Long term (current) use of inhaled steroids: Secondary | ICD-10-CM | POA: Diagnosis not present

## 2021-01-23 DIAGNOSIS — J441 Chronic obstructive pulmonary disease with (acute) exacerbation: Secondary | ICD-10-CM | POA: Insufficient documentation

## 2021-01-23 DIAGNOSIS — Z79899 Other long term (current) drug therapy: Secondary | ICD-10-CM | POA: Insufficient documentation

## 2021-01-23 DIAGNOSIS — Z87891 Personal history of nicotine dependence: Secondary | ICD-10-CM | POA: Insufficient documentation

## 2021-01-23 DIAGNOSIS — I480 Paroxysmal atrial fibrillation: Secondary | ICD-10-CM | POA: Insufficient documentation

## 2021-01-23 DIAGNOSIS — Z20822 Contact with and (suspected) exposure to covid-19: Secondary | ICD-10-CM | POA: Insufficient documentation

## 2021-01-23 DIAGNOSIS — E039 Hypothyroidism, unspecified: Secondary | ICD-10-CM | POA: Insufficient documentation

## 2021-01-23 DIAGNOSIS — Z7901 Long term (current) use of anticoagulants: Secondary | ICD-10-CM | POA: Insufficient documentation

## 2021-01-23 LAB — CBC WITH DIFFERENTIAL/PLATELET
Abs Immature Granulocytes: 0.09 10*3/uL — ABNORMAL HIGH (ref 0.00–0.07)
Basophils Absolute: 0 10*3/uL (ref 0.0–0.1)
Basophils Relative: 0 %
Eosinophils Absolute: 0 10*3/uL (ref 0.0–0.5)
Eosinophils Relative: 0 %
HCT: 33.3 % — ABNORMAL LOW (ref 36.0–46.0)
Hemoglobin: 10.3 g/dL — ABNORMAL LOW (ref 12.0–15.0)
Immature Granulocytes: 1 %
Lymphocytes Relative: 9 %
Lymphs Abs: 1.4 10*3/uL (ref 0.7–4.0)
MCH: 24.8 pg — ABNORMAL LOW (ref 26.0–34.0)
MCHC: 30.9 g/dL (ref 30.0–36.0)
MCV: 80 fL (ref 80.0–100.0)
Monocytes Absolute: 1.1 10*3/uL — ABNORMAL HIGH (ref 0.1–1.0)
Monocytes Relative: 7 %
Neutro Abs: 13.2 10*3/uL — ABNORMAL HIGH (ref 1.7–7.7)
Neutrophils Relative %: 83 %
Platelets: 419 10*3/uL — ABNORMAL HIGH (ref 150–400)
RBC: 4.16 MIL/uL (ref 3.87–5.11)
RDW: 19.9 % — ABNORMAL HIGH (ref 11.5–15.5)
WBC: 15.9 10*3/uL — ABNORMAL HIGH (ref 4.0–10.5)
nRBC: 0.4 % — ABNORMAL HIGH (ref 0.0–0.2)

## 2021-01-23 LAB — CULTURE, BLOOD (ROUTINE X 2)
Culture: NO GROWTH
Culture: NO GROWTH

## 2021-01-23 LAB — BASIC METABOLIC PANEL
Anion gap: 11 (ref 5–15)
BUN: 15 mg/dL (ref 8–23)
CO2: 26 mmol/L (ref 22–32)
Calcium: 8.8 mg/dL — ABNORMAL LOW (ref 8.9–10.3)
Chloride: 103 mmol/L (ref 98–111)
Creatinine, Ser: 0.75 mg/dL (ref 0.44–1.00)
GFR, Estimated: >60 mL/min (ref >60)
Glucose, Bld: 139 mg/dL — ABNORMAL HIGH (ref 70–99)
Potassium: 3.7 mmol/L (ref 3.5–5.1)
Sodium: 140 mmol/L (ref 135–145)

## 2021-01-23 LAB — BRAIN NATRIURETIC PEPTIDE: B Natriuretic Peptide: 351.3 pg/mL — ABNORMAL HIGH (ref 0.0–100.0)

## 2021-01-23 LAB — RESP PANEL BY RT-PCR (FLU A&B, COVID) ARPGX2
Influenza A by PCR: NEGATIVE
Influenza B by PCR: NEGATIVE
SARS Coronavirus 2 by RT PCR: NEGATIVE

## 2021-01-23 LAB — TROPONIN I (HIGH SENSITIVITY): Troponin I (High Sensitivity): 17 ng/L (ref <18)

## 2021-01-23 MED ORDER — IPRATROPIUM-ALBUTEROL 0.5-2.5 (3) MG/3ML IN SOLN
3.0000 mL | Freq: Once | RESPIRATORY_TRACT | Status: AC
  Administered 2021-01-23: 3 mL via RESPIRATORY_TRACT
  Filled 2021-01-23: qty 3

## 2021-01-23 NOTE — ED Notes (Signed)
X-ray at bedside
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
## 2021-01-23 NOTE — Discharge Instructions (Signed)
Please seek medical attention for any high fevers, chest pain, shortness of breath, change in behavior, persistent vomiting, bloody stool or any other new or concerning symptoms.  

## 2021-01-23 NOTE — ED Triage Notes (Signed)
C/o SOB since lunch time today. Pt. Was inpatient recently for same. Pt. Has hx of COPD.

## 2021-01-24 MED ORDER — LORAZEPAM 0.5 MG PO TABS
0.5000 mg | ORAL_TABLET | Freq: Once | ORAL | Status: AC
  Administered 2021-01-24: 0.5 mg via ORAL
  Filled 2021-01-24: qty 1

## 2021-01-24 NOTE — ED Notes (Addendum)
Pt verbalized understanding of discharge.

## 2021-02-26 ENCOUNTER — Emergency Department: Payer: Medicare (Managed Care)

## 2021-02-26 ENCOUNTER — Other Ambulatory Visit: Payer: Self-pay

## 2021-02-26 ENCOUNTER — Inpatient Hospital Stay
Admission: EM | Admit: 2021-02-26 | Discharge: 2021-03-05 | DRG: 177 | Disposition: A | Discharge status: Skilled Nursing Facility | Payer: Medicare (Managed Care) | Attending: Internal Medicine | Admitting: Internal Medicine

## 2021-02-26 DIAGNOSIS — F32A Depression, unspecified: Secondary | ICD-10-CM | POA: Diagnosis present

## 2021-02-26 DIAGNOSIS — W19XXXA Unspecified fall, initial encounter: Secondary | ICD-10-CM | POA: Diagnosis present

## 2021-02-26 DIAGNOSIS — D72828 Other elevated white blood cell count: Secondary | ICD-10-CM | POA: Diagnosis not present

## 2021-02-26 DIAGNOSIS — Z20822 Contact with and (suspected) exposure to covid-19: Secondary | ICD-10-CM | POA: Diagnosis present

## 2021-02-26 DIAGNOSIS — J441 Chronic obstructive pulmonary disease with (acute) exacerbation: Secondary | ICD-10-CM | POA: Diagnosis present

## 2021-02-26 DIAGNOSIS — Z7951 Long term (current) use of inhaled steroids: Secondary | ICD-10-CM

## 2021-02-26 DIAGNOSIS — D84821 Immunodeficiency due to drugs: Secondary | ICD-10-CM | POA: Diagnosis present

## 2021-02-26 DIAGNOSIS — Z79890 Hormone replacement therapy (postmenopausal): Secondary | ICD-10-CM

## 2021-02-26 DIAGNOSIS — J1569 Pneumonia due to other gram-negative bacteria: Secondary | ICD-10-CM | POA: Diagnosis present

## 2021-02-26 DIAGNOSIS — Z7984 Long term (current) use of oral hypoglycemic drugs: Secondary | ICD-10-CM

## 2021-02-26 DIAGNOSIS — Y92019 Unspecified place in single-family (private) house as the place of occurrence of the external cause: Secondary | ICD-10-CM | POA: Diagnosis not present

## 2021-02-26 DIAGNOSIS — S41112A Laceration without foreign body of left upper arm, initial encounter: Secondary | ICD-10-CM | POA: Diagnosis present

## 2021-02-26 DIAGNOSIS — S0003XA Contusion of scalp, initial encounter: Secondary | ICD-10-CM | POA: Diagnosis present

## 2021-02-26 DIAGNOSIS — I130 Hypertensive heart and chronic kidney disease with heart failure and stage 1 through stage 4 chronic kidney disease, or unspecified chronic kidney disease: Secondary | ICD-10-CM | POA: Diagnosis present

## 2021-02-26 DIAGNOSIS — N1831 Chronic kidney disease, stage 3a: Secondary | ICD-10-CM | POA: Diagnosis present

## 2021-02-26 DIAGNOSIS — E039 Hypothyroidism, unspecified: Secondary | ICD-10-CM | POA: Diagnosis present

## 2021-02-26 DIAGNOSIS — Z7983 Long term (current) use of bisphosphonates: Secondary | ICD-10-CM

## 2021-02-26 DIAGNOSIS — T380X5A Adverse effect of glucocorticoids and synthetic analogues, initial encounter: Secondary | ICD-10-CM | POA: Diagnosis present

## 2021-02-26 DIAGNOSIS — E1165 Type 2 diabetes mellitus with hyperglycemia: Secondary | ICD-10-CM | POA: Diagnosis present

## 2021-02-26 DIAGNOSIS — I5032 Chronic diastolic (congestive) heart failure: Secondary | ICD-10-CM | POA: Diagnosis present

## 2021-02-26 DIAGNOSIS — J440 Chronic obstructive pulmonary disease with acute lower respiratory infection: Secondary | ICD-10-CM | POA: Diagnosis present

## 2021-02-26 DIAGNOSIS — E872 Acidosis, unspecified: Secondary | ICD-10-CM | POA: Diagnosis present

## 2021-02-26 DIAGNOSIS — K219 Gastro-esophageal reflux disease without esophagitis: Secondary | ICD-10-CM | POA: Diagnosis present

## 2021-02-26 DIAGNOSIS — J9621 Acute and chronic respiratory failure with hypoxia: Secondary | ICD-10-CM | POA: Diagnosis present

## 2021-02-26 DIAGNOSIS — I272 Pulmonary hypertension, unspecified: Secondary | ICD-10-CM | POA: Diagnosis present

## 2021-02-26 DIAGNOSIS — Z79899 Other long term (current) drug therapy: Secondary | ICD-10-CM

## 2021-02-26 DIAGNOSIS — I4819 Other persistent atrial fibrillation: Secondary | ICD-10-CM | POA: Diagnosis present

## 2021-02-26 DIAGNOSIS — Z8673 Personal history of transient ischemic attack (TIA), and cerebral infarction without residual deficits: Secondary | ICD-10-CM | POA: Diagnosis not present

## 2021-02-26 DIAGNOSIS — E875 Hyperkalemia: Secondary | ICD-10-CM | POA: Diagnosis not present

## 2021-02-26 DIAGNOSIS — I10 Essential (primary) hypertension: Secondary | ICD-10-CM | POA: Diagnosis present

## 2021-02-26 DIAGNOSIS — E1122 Type 2 diabetes mellitus with diabetic chronic kidney disease: Secondary | ICD-10-CM | POA: Diagnosis present

## 2021-02-26 DIAGNOSIS — J156 Pneumonia due to other aerobic Gram-negative bacteria: Secondary | ICD-10-CM | POA: Diagnosis present

## 2021-02-26 DIAGNOSIS — Z66 Do not resuscitate: Secondary | ICD-10-CM | POA: Diagnosis present

## 2021-02-26 DIAGNOSIS — Z9981 Dependence on supplemental oxygen: Secondary | ICD-10-CM

## 2021-02-26 DIAGNOSIS — Z7901 Long term (current) use of anticoagulants: Secondary | ICD-10-CM

## 2021-02-26 DIAGNOSIS — I4891 Unspecified atrial fibrillation: Secondary | ICD-10-CM | POA: Diagnosis not present

## 2021-02-26 DIAGNOSIS — Z833 Family history of diabetes mellitus: Secondary | ICD-10-CM

## 2021-02-26 DIAGNOSIS — F411 Generalized anxiety disorder: Secondary | ICD-10-CM | POA: Diagnosis present

## 2021-02-26 DIAGNOSIS — R0602 Shortness of breath: Secondary | ICD-10-CM | POA: Diagnosis present

## 2021-02-26 DIAGNOSIS — Z7952 Long term (current) use of systemic steroids: Secondary | ICD-10-CM

## 2021-02-26 DIAGNOSIS — Z87891 Personal history of nicotine dependence: Secondary | ICD-10-CM

## 2021-02-26 DIAGNOSIS — Z888 Allergy status to other drugs, medicaments and biological substances status: Secondary | ICD-10-CM

## 2021-02-26 LAB — URINALYSIS, COMPLETE (UACMP) WITH MICROSCOPIC
Bilirubin Urine: NEGATIVE
Glucose, UA: NEGATIVE mg/dL
Hgb urine dipstick: NEGATIVE
Ketones, ur: NEGATIVE mg/dL
Nitrite: NEGATIVE
Protein, ur: NEGATIVE mg/dL
Specific Gravity, Urine: 1.011 (ref 1.005–1.030)
pH: 5 (ref 5.0–8.0)

## 2021-02-26 LAB — COMPREHENSIVE METABOLIC PANEL
ALT: 26 U/L (ref 0–44)
AST: 36 U/L (ref 15–41)
Albumin: 3.7 g/dL (ref 3.5–5.0)
Alkaline Phosphatase: 69 U/L (ref 38–126)
Anion gap: 10 (ref 5–15)
BUN: 18 mg/dL (ref 8–23)
CO2: 25 mmol/L (ref 22–32)
Calcium: 10 mg/dL (ref 8.9–10.3)
Chloride: 105 mmol/L (ref 98–111)
Creatinine, Ser: 1.03 mg/dL — ABNORMAL HIGH (ref 0.44–1.00)
GFR, Estimated: 56 mL/min — ABNORMAL LOW (ref >60)
Glucose, Bld: 153 mg/dL — ABNORMAL HIGH (ref 70–99)
Potassium: 4.9 mmol/L (ref 3.5–5.1)
Sodium: 140 mmol/L (ref 135–145)
Total Bilirubin: 1.1 mg/dL (ref 0.3–1.2)
Total Protein: 7.8 g/dL (ref 6.5–8.1)

## 2021-02-26 LAB — CBC
HCT: 42.5 % (ref 36.0–46.0)
Hemoglobin: 12.5 g/dL (ref 12.0–15.0)
MCH: 23.4 pg — ABNORMAL LOW (ref 26.0–34.0)
MCHC: 29.4 g/dL — ABNORMAL LOW (ref 30.0–36.0)
MCV: 79.6 fL — ABNORMAL LOW (ref 80.0–100.0)
Platelets: 532 10*3/uL — ABNORMAL HIGH (ref 150–400)
RBC: 5.34 MIL/uL — ABNORMAL HIGH (ref 3.87–5.11)
RDW: 19.9 % — ABNORMAL HIGH (ref 11.5–15.5)
WBC: 18.5 10*3/uL — ABNORMAL HIGH (ref 4.0–10.5)
nRBC: 0.2 % (ref 0.0–0.2)

## 2021-02-26 LAB — RESP PANEL BY RT-PCR (FLU A&B, COVID) ARPGX2
Influenza A by PCR: NEGATIVE
Influenza B by PCR: NEGATIVE
SARS Coronavirus 2 by RT PCR: NEGATIVE

## 2021-02-26 LAB — GLUCOSE, CAPILLARY
Glucose-Capillary: 206 mg/dL — ABNORMAL HIGH (ref 70–99)
Glucose-Capillary: 220 mg/dL — ABNORMAL HIGH (ref 70–99)
Glucose-Capillary: 205 mg/dL — ABNORMAL HIGH (ref 70–99)

## 2021-02-26 LAB — TROPONIN I (HIGH SENSITIVITY)
Troponin I (High Sensitivity): 13 ng/L (ref <18)
Troponin I (High Sensitivity): 12 ng/L (ref <18)

## 2021-02-26 LAB — BRAIN NATRIURETIC PEPTIDE: B Natriuretic Peptide: 293.9 pg/mL — ABNORMAL HIGH (ref 0.0–100.0)

## 2021-02-26 LAB — MRSA NEXT GEN BY PCR, NASAL: MRSA by PCR Next Gen: NOT DETECTED

## 2021-02-26 LAB — PROCALCITONIN: Procalcitonin: 0.14 ng/mL

## 2021-02-26 LAB — LACTIC ACID, PLASMA
Lactic Acid, Venous: 2.6 mmol/L (ref 0.5–1.9)
Lactic Acid, Venous: 2.9 mmol/L (ref 0.5–1.9)

## 2021-02-26 LAB — APTT: aPTT: 28 seconds (ref 24–36)

## 2021-02-26 LAB — PROTIME-INR
INR: 1.7 — ABNORMAL HIGH (ref 0.8–1.2)
Prothrombin Time: 19.8 seconds — ABNORMAL HIGH (ref 11.4–15.2)

## 2021-02-26 MED ORDER — MELATONIN 5 MG PO TABS
5.0000 mg | ORAL_TABLET | Freq: Every day | ORAL | Status: DC
  Administered 2021-02-26 – 2021-03-04 (×7): 5 mg via ORAL
  Filled 2021-02-26 (×7): qty 1

## 2021-02-26 MED ORDER — METOPROLOL TARTRATE 5 MG/5ML IV SOLN
5.0000 mg | Freq: Four times a day (QID) | INTRAVENOUS | Status: DC | PRN
  Filled 2021-02-26: qty 5

## 2021-02-26 MED ORDER — METHYLPREDNISOLONE SODIUM SUCC 125 MG IJ SOLR
125.0000 mg | Freq: Once | INTRAMUSCULAR | Status: AC
  Administered 2021-02-26: 125 mg via INTRAVENOUS
  Filled 2021-02-26: qty 2

## 2021-02-26 MED ORDER — BISOPROLOL FUMARATE 5 MG PO TABS
2.5000 mg | ORAL_TABLET | Freq: Two times a day (BID) | ORAL | Status: DC
  Administered 2021-02-26 – 2021-03-05 (×14): 2.5 mg via ORAL
  Filled 2021-02-26 (×17): qty 0.5

## 2021-02-26 MED ORDER — FAMOTIDINE 20 MG PO TABS
20.0000 mg | ORAL_TABLET | Freq: Two times a day (BID) | ORAL | Status: DC
  Administered 2021-02-26 – 2021-03-05 (×14): 20 mg via ORAL
  Filled 2021-02-26 (×14): qty 1

## 2021-02-26 MED ORDER — PREDNISONE 10 MG PO TABS: 40.0000 mg | ORAL_TABLET | Freq: Every day | ORAL | Status: DC

## 2021-02-26 MED ORDER — ACETAMINOPHEN 650 MG RE SUPP: 650.0000 mg | Freq: Four times a day (QID) | RECTAL | Status: DC | PRN

## 2021-02-26 MED ORDER — UMECLIDINIUM-VILANTEROL 62.5-25 MCG/ACT IN AEPB
1.0000 | INHALATION_SPRAY | Freq: Every day | RESPIRATORY_TRACT | Status: DC
  Administered 2021-02-27 – 2021-03-05 (×7): 1 via RESPIRATORY_TRACT
  Filled 2021-02-26: qty 14

## 2021-02-26 MED ORDER — ACETAMINOPHEN 325 MG PO TABS
650.0000 mg | ORAL_TABLET | Freq: Four times a day (QID) | ORAL | Status: DC | PRN
  Filled 2021-02-26: qty 2

## 2021-02-26 MED ORDER — LORAZEPAM 1 MG PO TABS
0.5000 mg | ORAL_TABLET | Freq: Every day | ORAL | Status: DC | PRN
  Administered 2021-02-26 – 2021-02-27 (×2): 0.5 mg via ORAL
  Filled 2021-02-26 (×2): qty 1

## 2021-02-26 MED ORDER — LACTATED RINGERS IV BOLUS
2415.0000 mL | Freq: Once | INTRAVENOUS | Status: AC
  Administered 2021-02-26: 500 mL via INTRAVENOUS

## 2021-02-26 MED ORDER — ONDANSETRON HCL 4 MG/2ML IJ SOLN
4.0000 mg | Freq: Four times a day (QID) | INTRAMUSCULAR | Status: DC | PRN
  Administered 2021-02-28: 4 mg via INTRAVENOUS
  Filled 2021-02-26: qty 2

## 2021-02-26 MED ORDER — LORAZEPAM 1 MG PO TABS
1.0000 mg | ORAL_TABLET | Freq: Two times a day (BID) | ORAL | Status: DC
  Administered 2021-02-27 – 2021-03-02 (×7): 1 mg via ORAL
  Filled 2021-02-26 (×7): qty 1

## 2021-02-26 MED ORDER — ONDANSETRON HCL 4 MG PO TABS: 4.0000 mg | ORAL_TABLET | Freq: Four times a day (QID) | ORAL | Status: DC | PRN

## 2021-02-26 MED ORDER — LACTATED RINGERS IV SOLN: INTRAVENOUS | Status: DC

## 2021-02-26 MED ORDER — SODIUM CHLORIDE 0.9 % IV SOLN
500.0000 mg | INTRAVENOUS | Status: AC
  Administered 2021-02-27 – 2021-03-02 (×4): 500 mg via INTRAVENOUS
  Filled 2021-02-26 (×3): qty 500
  Filled 2021-02-26: qty 5

## 2021-02-26 MED ORDER — SODIUM CHLORIDE 0.9 % IV SOLN: 2.0000 g | Freq: Three times a day (TID) | INTRAVENOUS | Status: DC

## 2021-02-26 MED ORDER — ALBUTEROL SULFATE (2.5 MG/3ML) 0.083% IN NEBU: 2.5000 mg | INHALATION_SOLUTION | RESPIRATORY_TRACT | Status: DC | PRN

## 2021-02-26 MED ORDER — IPRATROPIUM-ALBUTEROL 0.5-2.5 (3) MG/3ML IN SOLN
3.0000 mL | Freq: Once | RESPIRATORY_TRACT | Status: AC
  Administered 2021-02-26: 3 mL via RESPIRATORY_TRACT
  Filled 2021-02-26: qty 3

## 2021-02-26 MED ORDER — APIXABAN 5 MG PO TABS
5.0000 mg | ORAL_TABLET | Freq: Two times a day (BID) | ORAL | Status: DC
  Administered 2021-02-26 – 2021-03-05 (×14): 5 mg via ORAL
  Filled 2021-02-26 (×14): qty 1

## 2021-02-26 MED ORDER — SODIUM CHLORIDE 0.9 % IV SOLN
2.0000 g | Freq: Two times a day (BID) | INTRAVENOUS | Status: DC
  Administered 2021-02-26 – 2021-02-27 (×2): 2 g via INTRAVENOUS
  Filled 2021-02-26 (×3): qty 2

## 2021-02-26 MED ORDER — SODIUM CHLORIDE 0.9 % IV SOLN
2.0000 g | Freq: Once | INTRAVENOUS | Status: AC
  Administered 2021-02-26: 2 g via INTRAVENOUS
  Filled 2021-02-26: qty 2

## 2021-02-26 MED ORDER — LACTATED RINGERS IV BOLUS
500.0000 mL | Freq: Once | INTRAVENOUS | Status: AC
  Administered 2021-02-26: 500 mL via INTRAVENOUS

## 2021-02-26 MED ORDER — SENNOSIDES-DOCUSATE SODIUM 8.6-50 MG PO TABS: 1.0000 | ORAL_TABLET | Freq: Every evening | ORAL | Status: DC | PRN

## 2021-02-26 MED ORDER — DILTIAZEM HCL-DEXTROSE 125-5 MG/125ML-% IV SOLN (PREMIX)
5.0000 mg/h | INTRAVENOUS | Status: DC
  Administered 2021-02-26: 15 mg/h via INTRAVENOUS
  Administered 2021-02-26: 5 mg/h via INTRAVENOUS
  Administered 2021-02-26: 7.5 mg/h via INTRAVENOUS
  Filled 2021-02-26 (×2): qty 125

## 2021-02-26 MED ORDER — SODIUM CHLORIDE 0.9 % IV SOLN
500.0000 mg | Freq: Once | INTRAVENOUS | Status: AC
  Administered 2021-02-26: 500 mg via INTRAVENOUS
  Filled 2021-02-26: qty 5

## 2021-02-26 MED ORDER — LEVOTHYROXINE SODIUM 100 MCG PO TABS
200.0000 ug | ORAL_TABLET | Freq: Every day | ORAL | Status: DC
  Administered 2021-02-27 – 2021-03-05 (×7): 200 ug via ORAL
  Filled 2021-02-26 (×7): qty 2

## 2021-02-26 MED ORDER — BISACODYL 5 MG PO TBEC: 5.0000 mg | DELAYED_RELEASE_TABLET | Freq: Every day | ORAL | Status: DC | PRN

## 2021-02-26 MED ORDER — PAROXETINE HCL 20 MG PO TABS
20.0000 mg | ORAL_TABLET | Freq: Every evening | ORAL | Status: DC
  Administered 2021-02-27 – 2021-03-01 (×4): 20 mg via ORAL
  Filled 2021-02-26 (×6): qty 1

## 2021-02-26 MED ORDER — MORPHINE SULFATE (PF) 2 MG/ML IV SOLN: 2.0000 mg | INTRAVENOUS | Status: DC | PRN

## 2021-02-26 MED ORDER — METOPROLOL TARTRATE 5 MG/5ML IV SOLN: 5.0000 mg | Freq: Four times a day (QID) | INTRAVENOUS | Status: DC | PRN

## 2021-02-26 MED ORDER — HYDROCODONE-ACETAMINOPHEN 5-325 MG PO TABS: 1.0000 | ORAL_TABLET | ORAL | Status: DC | PRN

## 2021-02-26 MED ORDER — DILTIAZEM HCL ER COATED BEADS 180 MG PO CP24
300.0000 mg | ORAL_CAPSULE | Freq: Every day | ORAL | Status: DC
  Administered 2021-02-27 – 2021-03-01 (×3): 300 mg via ORAL
  Filled 2021-02-26 (×4): qty 1

## 2021-02-26 MED ORDER — METHYLPREDNISOLONE SODIUM SUCC 125 MG IJ SOLR
60.0000 mg | Freq: Two times a day (BID) | INTRAMUSCULAR | Status: AC
  Administered 2021-02-26 – 2021-02-27 (×2): 60 mg via INTRAVENOUS
  Filled 2021-02-26 (×2): qty 2

## 2021-02-26 MED ORDER — DILTIAZEM HCL 25 MG/5ML IV SOLN
10.0000 mg | Freq: Once | INTRAVENOUS | Status: AC
Start: 2021-02-26 — End: 2021-02-26
  Administered 2021-02-26: 10 mg via INTRAVENOUS
  Filled 2021-02-26: qty 5

## 2021-02-26 MED ORDER — CHLORHEXIDINE GLUCONATE CLOTH 2 % EX PADS
6.0000 | MEDICATED_PAD | Freq: Every day | CUTANEOUS | Status: DC
  Administered 2021-02-26 – 2021-03-05 (×5): 6 via TOPICAL

## 2021-02-26 NOTE — ED Notes (Signed)
Pt. Is edematous to peripheral extremities x4, pt. Is alert and oriented. Pt's skin has generalized bruising and skin tears. Pt. States she is on a blood thinner.

## 2021-02-26 NOTE — ED Notes (Signed)
Dr. Quentin Cornwall notified pt's current condition and VS, notified that only one IV is in place, asked if he would like additional IV attempted prior to IV team arrival. Dr. Quentin Cornwall states ok to hold antibiotics until IV team arrives.

## 2021-02-26 NOTE — ED Notes (Signed)
This RN notified Dr. Dena Billet that pt. Is now maxed out on her cardizem drip at 15mg /hr, HR is 100-120 with occasional pop up into 130, and that BP is holding steady.

## 2021-02-26 NOTE — ED Notes (Signed)
Elana RN aware of assigned bed

## 2021-02-26 NOTE — Consult Note (Signed)
PHARMACY -  BRIEF ANTIBIOTIC NOTE   Pharmacy has received consult(s) for cefepime from an ED provider. Patient is also ordered azithromycin. The patient's profile has been reviewed for ht/wt/allergies/indication/available labs.    One time order(s) placed for  --Cefepime 2 g IV  Further antibiotics/pharmacy consults should be ordered by admitting physician if indicated.                       Thank you, Benita Gutter 02/26/2021  12:35 PM

## 2021-02-26 NOTE — ED Notes (Signed)
This RN and RT to CT with pt. Cont. Cardiac monitoring maintained, Bipap maintained

## 2021-02-26 NOTE — ED Notes (Addendum)
Summer Guthrie, MD, from [facility] program called to check on pt. Received permission from daughter to update dr. Coralie Common on pt's condition and POC.

## 2021-02-26 NOTE — ED Notes (Signed)
Dr. Quentin Cornwall notified by this RN that pt. Is going in and out of a-flutter. EKG obtained.

## 2021-02-26 NOTE — ED Notes (Signed)
Pt's daughter at bedside, updated on pt's condition and POC.

## 2021-02-26 NOTE — ED Provider Notes (Signed)
[facility] Provider Note    Event Date/Time   First MD Initiated Contact with Patient 02/26/21 1143     (approximate)   History   Shortness of Breath and Fall   HPI  Summer Bradley is a 76 y.o. female past medical history of COPD as well as is on Eliquis for anticoagulation presents to the ER for shortness of breath and reported fall that occurred last night.  Does have ecchymosis to the left forehead.  Patient drowsy but will answer questions appropriately when she arrives via EMS tachypneic with prolonged expiratory phase diminished breath sounds throughout.  Was not given any steroids or nebulizer treatments via EMS.  She denies any abdominal pain.     Physical Exam   Triage Vital Signs: ED Triage Vitals  Enc Vitals Group     BP --      Pulse --      Resp 02/26/21 1147 (!) 26     Temp 02/26/21 1146 98.1 F (36.7 C)     Temp Source 02/26/21 1146 Oral     SpO2 --      Weight 02/26/21 1146 177 lb 7.5 oz (80.5 kg)     Height --      Head Circumference --      Peak Flow --      Pain Score 02/26/21 1146 0     Pain Loc --      Pain Edu? --      Excl. in [place]? --     Most recent vital signs: Vitals:   02/26/21 1545 02/26/21 1600  BP: 139/84 135/83  Pulse: (!) 106 (!) 46  Resp: (!) 27 (!) 29  Temp:    SpO2: 99% 96%     Constitutional: Ill-appearing in moderate respiratory distress tachypneic with use of accessory muscles. Eyes: Conjunctivae are normal.  Head: Small contusion to left forehead no laceration noted. Nose: No congestion/rhinnorhea. Mouth/Throat: Mucous membranes are moist.   Neck: Painless ROM.  Cardiovascular: Tachycardic irregularly irregular rhythm Respiratory: Tachypnea with diminished breath sounds bilaterally faint expiratory wheeze bibasilar crackles. Gastrointestinal: Soft and nontender.  Musculoskeletal:  no deformity Neurologic:  MAE spontaneously. No gross focal neurologic deficits are appreciated.  Skin:   Skin is warm, dry and intact. No rash noted. Psychiatric: Mood and affect are normal. Speech and behavior are normal.    ED Results / Procedures / Treatments   Labs (all labs ordered are listed, but only abnormal results are displayed) Labs Reviewed  CBC - Abnormal; Notable for the following components:      Result Value   WBC 18.5 (*)    RBC 5.34 (*)    MCV 79.6 (*)    MCH 23.4 (*)    MCHC 29.4 (*)    RDW 19.9 (*)    Platelets 532 (*)    All other components within normal limits  COMPREHENSIVE METABOLIC PANEL - Abnormal; Notable for the following components:   Glucose, Bld 153 (*)    Creatinine, Ser 1.03 (*)    GFR, Estimated 56 (*)    All other components within normal limits  BLOOD GAS, VENOUS - Abnormal; Notable for the following components:   Bicarbonate 30.1 (*)    Acid-Base Excess 3.7 (*)    All other components within normal limits  BRAIN NATRIURETIC PEPTIDE - Abnormal; Notable for the following components:   B Natriuretic Peptide 293.9 (*)    All other components within normal limits  RESP PANEL BY RT-PCR (  FLU A&B, COVID) ARPGX2  CULTURE, BLOOD (ROUTINE X 2)  CULTURE, BLOOD (ROUTINE X 2)  EXPECTORATED SPUTUM ASSESSMENT W GRAM STAIN, RFLX TO RESP C  LACTIC ACID, PLASMA  LACTIC ACID, PLASMA  PROTIME-INR  APTT  URINALYSIS, COMPLETE (UACMP) WITH MICROSCOPIC  PROCALCITONIN  TROPONIN I (HIGH SENSITIVITY)  TROPONIN I (HIGH SENSITIVITY)     EKG  ED ECG REPORT I, Merlyn Lot, the attending physician, personally viewed and interpreted this ECG.   Date: 02/26/2021  EKG Time: 11:51  Rate: 160  Rhythm: afib with rvr  Axis: normal  Intervals:none  ST&T Change: nonspecific sst abn likely rate dependent    RADIOLOGY Please see ED Course for my review and interpretation.  I personally reviewed all radiographic images ordered to evaluate for the above acute complaints and reviewed radiology reports and findings.  These findings were personally discussed  with the patient.  Please see medical record for radiology report.    PROCEDURES:  Critical Care performed: Yes, see critical care procedure note(s)  .Critical Care Performed by: Merlyn Lot, MD Authorized by: Merlyn Lot, MD   Critical care provider statement:    Critical care time (minutes):  40   Critical care was necessary to treat or prevent imminent or life-threatening deterioration of the following conditions:  Respiratory failure   Critical care was time spent personally by me on the following activities:  Ordering and performing treatments and interventions, ordering and review of laboratory studies, ordering and review of radiographic studies, pulse oximetry, re-evaluation of patient's condition, review of old charts, obtaining history from patient or surrogate, examination of patient, evaluation of patient's response to treatment, discussions with primary provider, discussions with consultants and development of treatment plan with patient or surrogate   MEDICATIONS ORDERED IN ED: Medications  diltiazem (CARDIZEM) 125 mg in dextrose 5% 125 mL (1 mg/mL) infusion (15 mg/hr Intravenous Rate/Dose Change 02/26/21 1610)  azithromycin (ZITHROMAX) 500 mg in sodium chloride 0.9 % 250 mL IVPB (500 mg Intravenous New Bag/Given 02/26/21 1603)  metoprolol tartrate (LOPRESSOR) injection 5 mg (has no administration in time range)  albuterol (PROVENTIL) (2.5 MG/3ML) 0.083% nebulizer solution 2.5 mg (has no administration in time range)  apixaban (ELIQUIS) tablet 5 mg (has no administration in time range)  bisoprolol (ZEBETA) tablet 2.5 mg (has no administration in time range)  diltiazem (CARDIZEM CD) 24 hr capsule 300 mg (has no administration in time range)  famotidine (PEPCID) tablet 20 mg (has no administration in time range)  levothyroxine (SYNTHROID) tablet 200 mcg (has no administration in time range)  LORazepam (ATIVAN) tablet 0.5 mg (has no administration in time range)   LORazepam (ATIVAN) tablet 1 mg (has no administration in time range)  melatonin tablet 5 mg (has no administration in time range)  PARoxetine (PAXIL) tablet 20 mg (has no administration in time range)  ipratropium-albuterol (DUONEB) 0.5-2.5 (3) MG/3ML nebulizer solution 3 mL (3 mLs Nebulization Given 02/26/21 1203)  methylPREDNISolone sodium succinate (SOLU-MEDROL) 125 mg/2 mL injection 125 mg (125 mg Intravenous Given 02/26/21 1209)  ipratropium-albuterol (DUONEB) 0.5-2.5 (3) MG/3ML nebulizer solution 3 mL (3 mLs Nebulization Given 02/26/21 1202)  diltiazem (CARDIZEM) injection 10 mg (10 mg Intravenous Given 02/26/21 1203)  ceFEPIme (MAXIPIME) 2 g in sodium chloride 0.9 % 100 mL IVPB (0 g Intravenous Stopped 02/26/21 1508)     IMPRESSION / MDM / [place] / ED COURSE  I reviewed the triage vital signs and the nursing notes.  Differential diagnosis includes, but is not limited to, Asthma, copd, CHF, pna, ptx, malignancy, Pe, anemia   Patient presenting symptoms as described above.  Patient ill-appearing chronic ducting her airway.  Does have signed DNR but otherwise full scope of care.  The patient will be placed on continuous pulse oximetry and telemetry for monitoring.  Laboratory evaluation will be sent to evaluate for the above complaints.     Clinical Course as of 02/26/21 1635  Wed Feb 26, 2021  1153 Patient in A. fib with RVR with heart rate in the 150s and 60s will order Cardizem bolus and infusion.  Patient to be placed on BiPAP. [PR]  6060 Chest x-ray by my review does show cardiomegaly but no pneumothorax. [PR]  1448 BNP is mildly elevated.  Patient reassessed.  O2 saturations are improving.  Heart rate is downtrending.  Respirations improving on BiPAP she is following commands.  Due to believe she stable and appropriate for admission to the hospitalist service. [PR]    Clinical Course User Index [PR] Merlyn Lot, MD     FINAL  CLINICAL IMPRESSION(S) / ED DIAGNOSES   Final diagnoses:  Acute on chronic respiratory failure with hypoxia ([place])  Atrial fibrillation with rapid ventricular response ([place])     Rx / DC Orders   ED Discharge Orders     None        Note:  This document was prepared using Dragon voice recognition software and may include unintentional dictation errors.    Merlyn Lot, MD 02/26/21 [phone]

## 2021-02-26 NOTE — ED Notes (Signed)
Attempted to call report to ICU, no RN avail.

## 2021-02-26 NOTE — Hospital Course (Addendum)
° °  Coding   MDM

## 2021-02-26 NOTE — ED Notes (Signed)
[phone] Dr. Coralie Common, pt's PCP, daily care MD

## 2021-02-26 NOTE — ED Notes (Signed)
Denton Ar, RN verified dilt drip rate change.

## 2021-02-26 NOTE — Progress Notes (Signed)
PHARMACY NOTE:  ANTIMICROBIAL RENAL DOSAGE ADJUSTMENT  Current antimicrobial regimen includes a mismatch between antimicrobial dosage and estimated renal function.  As per policy approved by the Pharmacy & Therapeutics and Medical Executive Committees, the antimicrobial dosage will be adjusted accordingly.  Current antimicrobial dosage:  Cefepime 2g IV every 8 hours   Indication: CAP  Renal Function:  Estimated Creatinine Clearance: 45.7 mL/min (A) (by C-G formula based on SCr of 1.03 mg/dL (H)).     Antimicrobial dosage has been changed to:  Cefepime 2g IV every 12 hours for CrCl <48ml/ml   Additional comments:  Thank you for allowing pharmacy to be a part of this patient's care.  Pernell Dupre, PharmD, BCPS Clinical Pharmacist 02/26/2021 5:58 PM
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
## 2021-02-27 LAB — LACTIC ACID, PLASMA
Lactic Acid, Venous: 2.3 mmol/L (ref 0.5–1.9)
Lactic Acid, Venous: 2.7 mmol/L (ref 0.5–1.9)

## 2021-02-27 LAB — CBC
HCT: 36.1 % (ref 36.0–46.0)
Hemoglobin: 10.8 g/dL — ABNORMAL LOW (ref 12.0–15.0)
MCH: 23.4 pg — ABNORMAL LOW (ref 26.0–34.0)
MCHC: 29.9 g/dL — ABNORMAL LOW (ref 30.0–36.0)
MCV: 78.1 fL — ABNORMAL LOW (ref 80.0–100.0)
Platelets: 477 10*3/uL — ABNORMAL HIGH (ref 150–400)
RBC: 4.62 MIL/uL (ref 3.87–5.11)
RDW: 19.4 % — ABNORMAL HIGH (ref 11.5–15.5)
WBC: 13.7 10*3/uL — ABNORMAL HIGH (ref 4.0–10.5)
nRBC: 0.3 % — ABNORMAL HIGH (ref 0.0–0.2)

## 2021-02-27 LAB — BASIC METABOLIC PANEL
Anion gap: 7 (ref 5–15)
BUN: 23 mg/dL (ref 8–23)
CO2: 24 mmol/L (ref 22–32)
Calcium: 9.1 mg/dL (ref 8.9–10.3)
Chloride: 105 mmol/L (ref 98–111)
Creatinine, Ser: 1.17 mg/dL — ABNORMAL HIGH (ref 0.44–1.00)
GFR, Estimated: 48 mL/min — ABNORMAL LOW (ref >60)
Glucose, Bld: 227 mg/dL — ABNORMAL HIGH (ref 70–99)
Potassium: 5.5 mmol/L — ABNORMAL HIGH (ref 3.5–5.1)
Sodium: 136 mmol/L (ref 135–145)

## 2021-02-27 LAB — STREP PNEUMONIAE URINARY ANTIGEN: Strep Pneumo Urinary Antigen: NEGATIVE

## 2021-02-27 LAB — GLUCOSE, CAPILLARY
Glucose-Capillary: 188 mg/dL — ABNORMAL HIGH (ref 70–99)
Glucose-Capillary: 203 mg/dL — ABNORMAL HIGH (ref 70–99)
Glucose-Capillary: 183 mg/dL — ABNORMAL HIGH (ref 70–99)

## 2021-02-27 LAB — POTASSIUM: Potassium: 4.9 mmol/L (ref 3.5–5.1)

## 2021-02-27 MED ORDER — METHYLPREDNISOLONE SODIUM SUCC 125 MG IJ SOLR
60.0000 mg | Freq: Every day | INTRAMUSCULAR | Status: DC
  Administered 2021-02-28 – 2021-03-01 (×2): 60 mg via INTRAVENOUS
  Filled 2021-02-27 (×2): qty 2

## 2021-02-27 MED ORDER — SODIUM ZIRCONIUM CYCLOSILICATE 5 G PO PACK
10.0000 g | PACK | Freq: Once | ORAL | Status: AC
Start: 2021-02-27 — End: 2021-02-27
  Administered 2021-02-27: 10 g via ORAL
  Filled 2021-02-27: qty 2

## 2021-02-27 MED ORDER — INSULIN ASPART 100 UNIT/ML IJ SOLN
0.0000 [IU] | Freq: Every day | INTRAMUSCULAR | Status: DC
  Administered 2021-02-28: 2 [IU] via SUBCUTANEOUS
  Administered 2021-03-01: 3 [IU] via SUBCUTANEOUS
  Administered 2021-03-02: 5 [IU] via SUBCUTANEOUS
  Administered 2021-03-03 – 2021-03-04 (×2): 2 [IU] via SUBCUTANEOUS
  Filled 2021-02-27 (×5): qty 1

## 2021-02-27 MED ORDER — LORAZEPAM 2 MG/ML IJ SOLN
1.0000 mg | INTRAMUSCULAR | Status: DC | PRN
  Administered 2021-02-28 – 2021-03-02 (×4): 1 mg via INTRAVENOUS
  Filled 2021-02-27 (×4): qty 1

## 2021-02-27 MED ORDER — INSULIN ASPART 100 UNIT/ML IJ SOLN
0.0000 [IU] | Freq: Three times a day (TID) | INTRAMUSCULAR | Status: DC
  Administered 2021-02-27: 2 [IU] via SUBCUTANEOUS
  Administered 2021-02-28: 1 [IU] via SUBCUTANEOUS
  Filled 2021-02-27 (×2): qty 1

## 2021-02-27 NOTE — Progress Notes (Signed)
PROGRESS NOTE    Summer Bradley  IWL:[id number] DOB: 12/22/1945 DOA: 02/26/2021 PCP: [facility]     Brief Narrative:  Summer Bradley is a 76 y.o. female with medical history significant for atrial fibrillation, diastolic CHF, COPD, chronic respiratory failure 3L [place] at home, diabetes, history of R MCA stroke, hypertension, pulmonary hypertension, recent admission 01/18/21 - 01/21/21 for COPD exacerbation, who presents to the emergency department on 02/27/20 with shortness of breath. Patient has chronic shortness of breath but it began to worsen several days before admission, and duration is now constant. Chest x-ray showed cardiomegaly and bibasilar opacities. Head CT showed chronic Right MCA infarct, left frontal and parietal scalp hematomas, and pan-sinus disease. Patient was found to be in A fib with RVR. She was started on an IV diltiazem drip. She was placed on BiPAP. Blood cultures were drawn. Patient was given Duo-nebs, IV methylprednisolone, IV cefepime, and IV azithromycin.   New events last 24 hours / Subjective: Heart rate has been better controlled and patient was weaned off IV Cardizem drip.  She was insistent on taking her BiPAP off, and was put on 5 L nasal cannula O2 and remained stable.  Daughter was at bedside.  We discussed patient's tenuous medical condition including advanced COPD.  Her A. fib RVR is better controlled, but she may need BiPAP again if she tires out with her respiratory failure.  Patient to remain in stepdown unit today  Assessment & Plan:   Principal Problem:   Acute and chronic respiratory failure with hypoxia (HCC) Active Problems:   Essential hypertension   COPD with acute exacerbation (HCC)   Chronic heart failure with preserved ejection fraction (HFpEF) (HCC)   Atrial fibrillation with rapid ventricular response (HCC)   Hypothyroidism   GERD (gastroesophageal reflux disease)   Lactic acidosis   Pneumonia due to gram-negative  bacteria (HCC)   Acute on chronic hypoxemic respiratory failure -Requires 3 L nasal cannula O2 at baseline -Required BiPAP on admission, weaned down to 5 L oxygen  COPD exacerbation -Continue azithromycin, solumedrol, breathing tx   A. fib RVR -Now weaned off Cardizem drip -Continue p.o. Cardizem, Eliquis  Hyperkalemia -Patient given Lokelma with repeat potassium level within normal limit  CKD stage IIIa -Baseline creatinine 0.8-0.9 -Stable   Fall at home -CT head without acute intracranial process  History of CVA -Continue eliquis   Hypothyroidism -Continue Synthroid  Anxiety -Continue Ativan    DVT prophylaxis:  SCDs Start: 02/26/21 1735 Place and maintain sequential compression device Start: 02/26/21 1459 apixaban (ELIQUIS) tablet 5 mg  Code Status: DNR  Family Communication: Daughter at bedside  Disposition Plan:  Status is: Inpatient  Remains inpatient appropriate because: resp failure       Consultants:  None  Procedures:  None   Antimicrobials:  Anti-infectives (From admission, onward)    Start     Dose/Rate Route Frequency Ordered Stop   02/27/21 1400  azithromycin (ZITHROMAX) 500 mg in sodium chloride 0.9 % 250 mL IVPB        500 mg 250 mL/hr over 60 Minutes Intravenous Every 24 hours 02/26/21 1745 03/03/21 1359   02/26/21 2200  ceFEPIme (MAXIPIME) 2 g in sodium chloride 0.9 % 100 mL IVPB        2 g 200 mL/hr over 30 Minutes Intravenous Every 12 hours 02/26/21 1757 03/03/21 2159   02/26/21 2000  ceFEPIme (MAXIPIME) 2 g in sodium chloride 0.9 % 100 mL IVPB  Status:  Discontinued  2 g 200 mL/hr over 30 Minutes Intravenous Every 8 hours 02/26/21 1745 02/26/21 1756   02/26/21 1230  ceFEPIme (MAXIPIME) 2 g in sodium chloride 0.9 % 100 mL IVPB        2 g 200 mL/hr over 30 Minutes Intravenous  Once 02/26/21 1225 02/26/21 1508   02/26/21 1230  azithromycin (ZITHROMAX) 500 mg in sodium chloride 0.9 % 250 mL IVPB        500 mg 250 mL/hr  over 60 Minutes Intravenous  Once 02/26/21 1225 02/26/21 1813        Objective: Vitals:   02/27/21 1100 02/27/21 1130 02/27/21 1200 02/27/21 1300  BP: 119/78 114/66 137/86 108/67  Pulse: (!) 104 83 (!) 108 70  Resp: (!) 29 (!) 32 (!) 29 (!) 30  Temp:      TempSrc:      SpO2: 92% 96% 92% 93%  Weight:      Height:        Intake/Output Summary (Last 24 hours) at 02/27/2021 1407 Last data filed at 02/27/2021 1200 Gross per 24 hour  Intake 1816.3 ml  Output 400 ml  Net 1416.3 ml   Filed Weights   02/26/21 1146 02/26/21 1800 02/27/21 0500  Weight: 80.5 kg 80.9 kg 82.8 kg    Examination:  General exam: Appears calm  Respiratory system: Diminished breath sounds bilaterally posteriorly, wheezes anteriorly Cardiovascular system: S1 & S2 heard, irregular rhythm with rate 70s. No murmurs. No pedal edema. Gastrointestinal system: Abdomen is nondistended, soft and nontender. Normal bowel sounds heard. Central nervous system: Alert and oriented. No focal neurological deficits. Speech clear.  Extremities: Symmetric in appearance  Skin: No rashes, lesions or ulcers on exposed skin  Psychiatry: Judgement and insight appear normal. Mood & affect appropriate.   Data Reviewed: I have personally reviewed following labs and imaging studies  CBC: Recent Labs  Lab 02/26/21 1150 02/27/21 0251  WBC 18.5* 13.7*  HGB 12.5 10.8*  HCT 42.5 36.1  MCV 79.6* 78.1*  PLT 532* 846*   Basic Metabolic Panel: Recent Labs  Lab 02/26/21 1150 02/27/21 0251 02/27/21 1211  NA 140 136  --   K 4.9 5.5* 4.9  CL 105 105  --   CO2 25 24  --   GLUCOSE 153* 227*  --   BUN 18 23  --   CREATININE 1.03* 1.17*  --   CALCIUM 10.0 9.1  --    GFR: Estimated Creatinine Clearance: 43.5 mL/min (A) (by C-G formula based on SCr of 1.17 mg/dL (H)). Liver Function Tests: Recent Labs  Lab 02/26/21 1150  AST 36  ALT 26  ALKPHOS 69  BILITOT 1.1  PROT 7.8  ALBUMIN 3.7   No results for input(s): LIPASE,  AMYLASE in the last 168 hours. No results for input(s): AMMONIA in the last 168 hours. Coagulation Profile: Recent Labs  Lab 02/26/21 1714  INR 1.7*   Cardiac Enzymes: No results for input(s): CKTOTAL, CKMB, CKMBINDEX, TROPONINI in the last 168 hours. BNP (last 3 results) No results for input(s): PROBNP in the last 8760 hours. HbA1C: No results for input(s): HGBA1C in the last 72 hours. CBG: Recent Labs  Lab 02/26/21 1805 02/26/21 1950 02/26/21 2320 02/27/21 0331  GLUCAP 206* 220* 205* 188*   Lipid Profile: No results for input(s): CHOL, HDL, LDLCALC, TRIG, CHOLHDL, LDLDIRECT in the last 72 hours. Thyroid Function Tests: No results for input(s): TSH, T4TOTAL, FREET4, T3FREE, THYROIDAB in the last 72 hours. Anemia Panel: No results for input(s): VITAMINB12,  FOLATE, FERRITIN, TIBC, IRON, RETICCTPCT in the last 72 hours. Sepsis Labs: Recent Labs  Lab 02/26/21 1628 02/26/21 1714 02/26/21 1952 02/27/21 0017 02/27/21 0251  PROCALCITON  --  0.14  --   --   --   LATICACIDVEN 2.6*  --  2.9* 2.3* 2.7*    Recent Results (from the past 240 hour(s))  Resp Panel by RT-PCR (Flu A&B, Covid) Nasopharyngeal Swab     Status: None   Collection Time: 02/26/21 11:50 AM   Specimen: Nasopharyngeal Swab; Nasopharyngeal(NP) swabs in vial transport medium  Result Value Ref Range Status   SARS Coronavirus 2 by RT PCR NEGATIVE NEGATIVE Final    Comment: (NOTE) SARS-CoV-2 target nucleic acids are NOT DETECTED.  The SARS-CoV-2 RNA is generally detectable in upper respiratory specimens during the acute phase of infection. The lowest concentration of SARS-CoV-2 viral copies this assay can detect is 138 copies/mL. A negative result does not preclude SARS-Cov-2 infection and should not be used as the sole basis for treatment or other patient management decisions. A negative result may occur with  improper specimen collection/handling, submission of specimen other than nasopharyngeal swab,  presence of viral mutation(s) within the areas targeted by this assay, and inadequate number of viral copies(<138 copies/mL). A negative result must be combined with clinical observations, patient history, and epidemiological information. The expected result is Negative.  Fact Sheet for Patients:  EntrepreneurPulse.com.au  Fact Sheet for Healthcare Providers:  IncredibleEmployment.be  This test is no t yet approved or cleared by the [place] FDA and  has been authorized for detection and/or diagnosis of SARS-CoV-2 by FDA under an Emergency Use Authorization (EUA). This EUA will remain  in effect (meaning this test can be used) for the duration of the COVID-19 declaration under Section 564(b)(1) of the Act, 21 U.S.C.section 360bbb-3(b)(1), unless the authorization is terminated  or revoked sooner.       Influenza A by PCR NEGATIVE NEGATIVE Final   Influenza B by PCR NEGATIVE NEGATIVE Final    Comment: (NOTE) The Xpert Xpress SARS-CoV-2/FLU/RSV plus assay is intended as an aid in the diagnosis of influenza from Nasopharyngeal swab specimens and should not be used as a sole basis for treatment. Nasal washings and aspirates are unacceptable for Xpert Xpress SARS-CoV-2/FLU/RSV testing.  Fact Sheet for Patients: EntrepreneurPulse.com.au  Fact Sheet for Healthcare Providers: IncredibleEmployment.be  This test is not yet approved or cleared by the [place] FDA and has been authorized for detection and/or diagnosis of SARS-CoV-2 by FDA under an Emergency Use Authorization (EUA). This EUA will remain in effect (meaning this test can be used) for the duration of the COVID-19 declaration under Section 564(b)(1) of the Act, 21 U.S.C. section 360bbb-3(b)(1), unless the authorization is terminated or revoked.  Performed at [facility], [address] [place], [place] [address]   Blood Culture  (routine x 2)     Status: None (Preliminary result)   Collection Time: 02/26/21 11:50 AM   Specimen: BLOOD  Result Value Ref Range Status   Specimen Description BLOOD RIGHT ANTECUBITAL  Final   Special Requests BOTTLES DRAWN AEROBIC AND ANAEROBIC BCAV  Final   Culture   Final    NO GROWTH < 24 HOURS Performed at [facility], [address] [place],  [address]    Report Status PENDING  Incomplete  Blood Culture (routine x 2)     Status: None (Preliminary result)   Collection Time: 02/26/21 12:06 PM   Specimen: BLOOD  Result Value Ref  Range Status   Specimen Description BLOOD LEFT ANTECUBITAL  Final   Special Requests BOTTLES DRAWN AEROBIC AND ANAEROBIC BCAV  Final   Culture   Final    NO GROWTH < 24 HOURS Performed at [facility], [address] [place], [place] [address]    Report Status PENDING  Incomplete  MRSA Next Gen by PCR, Nasal     Status: None   Collection Time: 02/26/21  7:43 PM   Specimen: Nasal Mucosa; Nasal Swab  Result Value Ref Range Status   MRSA by PCR Next Gen NOT DETECTED NOT DETECTED Final    Comment: (NOTE) The GeneXpert MRSA Assay (FDA approved for NASAL specimens only), is one component of a comprehensive MRSA colonization surveillance program. It is not intended to diagnose MRSA infection nor to guide or monitor treatment for MRSA infections. Test performance is not FDA approved in patients less than 19 years old. Performed at [facility], [address] [place], [place] [address]       Radiology Studies: CT HEAD WO CONTRAST (5MM)  Result Date: 02/26/2021 CLINICAL DATA:  Head trauma EXAM: CT HEAD WITHOUT CONTRAST TECHNIQUE: Contiguous axial images were obtained from the base of the skull through the vertex without intravenous contrast. RADIATION DOSE REDUCTION: This exam was performed according to the departmental dose-optimization program which includes automated exposure control, adjustment of the mA and/or  kV according to patient size and/or use of iterative reconstruction technique. COMPARISON:  12/18/2020 FINDINGS: Brain: Redemonstrated sequela of prior large right MCA territory infarcts, with associated encephalomalacia and ex vacuo dilatation of the right lateral ventricle. Additional lacunar infarcts in the bilateral cerebellar hemispheres and pons. No new hypodensity to suggest acute infarct. No acute hemorrhage, mass, mass effect, or midline shift. Periventricular white matter changes, likely the sequela of chronic small vessel ischemic disease. Vascular: No hyperdense vessel. Atherosclerotic calcifications in the intracranial carotid and vertebral arteries. Skull: Normal. Negative for fracture or focal lesion. Sinuses/Orbits: Near-complete opacification of the right maxillary sinus. Partial opacification of the left maxillary sinus, bilateral frontal and sphenoid sinuses, and ethmoid air cells. Status post bilateral lens replacements. No acute orbital abnormality. Other: Fluid in the right mastoid air cells. Left frontal and parietal scalp hematoma. IMPRESSION: 1. No acute intracranial process. Redemonstrated sequela of chronic right MCA infarct. 2. Pansinus disease, which is new from the prior exam, and can be seen in the setting of acute sinusitis. Correlate with symptoms. Electronically Signed   By: Merilyn Baba M.D.   On: 02/26/2021 13:52   DG Chest Portable 1 View  Result Date: 02/26/2021 CLINICAL DATA:  Shortness of breath, fell last night, altered mental status, labored breathing EXAM: PORTABLE CHEST 1 VIEW COMPARISON:  Portable exam 1153 hours compared to 01/23/2021 FINDINGS: Enlargement of cardiac silhouette. Atherosclerotic calcification aorta. Mediastinal contours and pulmonary vascularity normal. Bibasilar opacities question atelectasis versus infiltrate. Upper lungs clear. No pleural effusion or pneumothorax. Bones demineralized. IMPRESSION: Enlargement of cardiac silhouette with bibasilar  opacities question atelectasis versus infiltrate. Electronically Signed   By: Lavonia Dana M.D.   On: 02/26/2021 12:03      Scheduled Meds:  apixaban  5 mg Oral BID   bisoprolol  2.5 mg Oral BID   Chlorhexidine Gluconate Cloth  6 each Topical Q0600   diltiazem  300 mg Oral Daily   famotidine  20 mg Oral BID   insulin aspart  0-5 Units Subcutaneous QHS   insulin aspart  0-6 Units Subcutaneous TID WC   levothyroxine  200 mcg Oral Daily   LORazepam  1 mg Oral BID   melatonin  5 mg Oral QHS   PARoxetine  20 mg Oral QPM   [START ON 02/28/2021] predniSONE  40 mg Oral Q breakfast   umeclidinium-vilanterol  1 puff Inhalation Daily   Continuous Infusions:  azithromycin     ceFEPime (MAXIPIME) IV 200 mL/hr at 02/27/21 1200   diltiazem (CARDIZEM) infusion Stopped (02/27/21 0845)     LOS: 1 day      Time spent: 35 minutes   Dessa Phi, DO [facility] 02/27/2021, 2:07 PM   Available via Epic secure chat 7am-7pm After these hours, please refer to coverage provider listed on [email or web address]

## 2021-02-27 NOTE — Care Management Important Message (Signed)
Important Message  Patient Details  Name: Summer Bradley MRN: [id number] Date of Birth: [birth year]   Medicare Important Message Given:  N/A - LOS <3 / Initial given by admissions     [name] 02/27/2021, 7:29 PM

## 2021-02-27 NOTE — Plan of Care (Signed)

## 2021-02-27 NOTE — Progress Notes (Signed)
Patient currently calling out for water and requesting Bi-Pap be removed.   Patients morning potassium level 5.5.  Messaged MD Maylene Roes to inform.  MD Maylene Roes advised okay to transition patient off Bi-Pap to Nasal Cannula and allow patient to eat and drink.    New orders received for Potassium level.

## 2021-02-27 NOTE — Progress Notes (Addendum)
Patient's daughter Summer Bradley reporting patient's Top dentures missing.   Patient's daughter was visiting in the morning and noticed that patient did not have her Top dentures.  Patient's daughter went to patient's home to look for dentures and they were not present.  Patient's daughter Summer Bradley called [facility] patient attends to see if they had the dentures.  Per [facility], the patient was wearing her Top dentures when she was transported to [facility] ED.   RN looked through patient's room, no dentures present.  No documentation regarding patient's dentures in Epic.  Sarah, charge RN, informed and will reach out to ED to look into locating dentures.

## 2021-02-27 NOTE — Progress Notes (Signed)
Inpatient Diabetes Program Recommendations  AACE/ADA: New Consensus Statement on Inpatient Glycemic Control (2015)  Target Ranges:  Prepandial:   less than 140 mg/dL      Peak postprandial:   less than 180 mg/dL (1-2 hours)      Critically ill patients:  140 - 180 mg/dL   Lab Results  Component Value Date   GLUCAP 188 (H) 02/27/2021   HGBA1C 6.0 (H) 11/17/2020    Latest Reference Range & Units 02/26/21 18:05 02/26/21 19:50 02/26/21 23:20 02/27/21 03:31  Glucose-Capillary 70 - 99 mg/dL 206 (H) 220 (H) 205 (H) 188 (H)  (H): Data is abnormally high Review of Glycemic Control  Diabetes history: type 2 Outpatient Diabetes medications: Metformin 500 mg BID Current orders for Inpatient glycemic control: none  Inpatient Diabetes Program Recommendations:   Noted that blood sugars have been greater than 180 mg/dl.  Recommend starting Novolog 0-6 units TID & 0-5 units HS scale if blood sugars continue to be elevated and while on steroids.   Harvel Ricks RN BSN CDE Diabetes Coordinator Pager: [phone]  8am-5pm

## 2021-02-28 LAB — LEGIONELLA PNEUMOPHILA SEROGP 1 UR AG: L. pneumophila Serogp 1 Ur Ag: NEGATIVE

## 2021-02-28 LAB — CBC
HCT: 33.6 % — ABNORMAL LOW (ref 36.0–46.0)
Hemoglobin: 9.9 g/dL — ABNORMAL LOW (ref 12.0–15.0)
MCH: 23.2 pg — ABNORMAL LOW (ref 26.0–34.0)
MCHC: 29.5 g/dL — ABNORMAL LOW (ref 30.0–36.0)
MCV: 78.7 fL — ABNORMAL LOW (ref 80.0–100.0)
Platelets: 435 10*3/uL — ABNORMAL HIGH (ref 150–400)
RBC: 4.27 MIL/uL (ref 3.87–5.11)
RDW: 19.5 % — ABNORMAL HIGH (ref 11.5–15.5)
WBC: 19.2 10*3/uL — ABNORMAL HIGH (ref 4.0–10.5)
nRBC: 0.6 % — ABNORMAL HIGH (ref 0.0–0.2)

## 2021-02-28 LAB — BASIC METABOLIC PANEL
Anion gap: 7 (ref 5–15)
BUN: 42 mg/dL — ABNORMAL HIGH (ref 8–23)
CO2: 26 mmol/L (ref 22–32)
Calcium: 9.2 mg/dL (ref 8.9–10.3)
Chloride: 105 mmol/L (ref 98–111)
Creatinine, Ser: 1.24 mg/dL — ABNORMAL HIGH (ref 0.44–1.00)
GFR, Estimated: 45 mL/min — ABNORMAL LOW (ref >60)
Glucose, Bld: 189 mg/dL — ABNORMAL HIGH (ref 70–99)
Potassium: 4.2 mmol/L (ref 3.5–5.1)
Sodium: 138 mmol/L (ref 135–145)

## 2021-02-28 LAB — GLUCOSE, CAPILLARY
Glucose-Capillary: 178 mg/dL — ABNORMAL HIGH (ref 70–99)
Glucose-Capillary: 224 mg/dL — ABNORMAL HIGH (ref 70–99)
Glucose-Capillary: 248 mg/dL — ABNORMAL HIGH (ref 70–99)
Glucose-Capillary: 242 mg/dL — ABNORMAL HIGH (ref 70–99)

## 2021-02-28 MED ORDER — INSULIN ASPART 100 UNIT/ML IJ SOLN
0.0000 [IU] | Freq: Three times a day (TID) | INTRAMUSCULAR | Status: DC
  Administered 2021-02-28 (×2): 3 [IU] via SUBCUTANEOUS
  Administered 2021-03-01: 5 [IU] via SUBCUTANEOUS
  Administered 2021-03-01: 1 [IU] via SUBCUTANEOUS
  Administered 2021-03-01 – 2021-03-02 (×2): 2 [IU] via SUBCUTANEOUS
  Administered 2021-03-02 – 2021-03-03 (×3): 3 [IU] via SUBCUTANEOUS
  Administered 2021-03-04 – 2021-03-05 (×4): 2 [IU] via SUBCUTANEOUS
  Administered 2021-03-05: 3 [IU] via SUBCUTANEOUS
  Filled 2021-02-28 (×13): qty 1

## 2021-02-28 MED ORDER — DM-GUAIFENESIN ER 30-600 MG PO TB12
1.0000 | ORAL_TABLET | Freq: Two times a day (BID) | ORAL | Status: DC
  Administered 2021-02-28 – 2021-03-05 (×11): 1 via ORAL
  Filled 2021-02-28 (×11): qty 1

## 2021-02-28 NOTE — Progress Notes (Addendum)
PROGRESS NOTE    Summer Bradley  OIZ:[id number] DOB: July 29, 1945 DOA: 02/26/2021 PCP: [facility]     Brief Narrative:  Summer Bradley is a 76 y.o. female with medical history significant for atrial fibrillation, diastolic CHF, COPD, chronic respiratory failure 3L [place] at home, diabetes, history of R MCA stroke, hypertension, pulmonary hypertension, recent admission 01/18/21 - 01/21/21 for COPD exacerbation, who presents to the emergency department on 02/27/20 with shortness of breath. Patient has chronic shortness of breath but it began to worsen several days before admission, and duration is now constant. Chest x-ray showed cardiomegaly and bibasilar opacities. Head CT showed chronic Right MCA infarct, left frontal and parietal scalp hematomas, and pan-sinus disease. Patient was found to be in A fib with RVR. She was started on an IV diltiazem drip. She was placed on BiPAP. Blood cultures were drawn. Patient was given Duo-nebs, IV methylprednisolone, IV cefepime, and IV azithromycin.   [facility] # [phone]  New events last 24 hours / Subjective: Patient was back on BiPAP overnight for a short period of time, had some confusion overnight as well. This morning, without any new complaints, appears very fatigued but in no respiratory distress. Updated Dr. Twanna Hy from [facility] today.   Assessment & Plan:   Principal Problem:   Acute and chronic respiratory failure with hypoxia (HCC) Active Problems:   Essential hypertension   COPD with acute exacerbation (HCC)   Chronic heart failure with preserved ejection fraction (HFpEF) (HCC)   Atrial fibrillation with rapid ventricular response (HCC)   Hypothyroidism   GERD (gastroesophageal reflux disease)   Lactic acidosis   Pneumonia due to gram-negative bacteria (HCC)   Acute on chronic hypoxemic respiratory failure -Requires 3 L nasal cannula O2 at baseline -Required BiPAP on admission, weaned down to 3 L oxygen this  morning   COPD exacerbation -Continue azithromycin, solumedrol, breathing tx   A. fib RVR, persistent -Now weaned off Cardizem drip -Continue p.o. Cardizem, Eliquis -HR well controlled  CKD stage IIIa -Baseline creatinine 0.8-0.9 -Stable   Fall at home -CT head without acute intracranial process  History of CVA -Continue eliquis   Hypothyroidism -Continue Synthroid  Anxiety -Continue Ativan    DVT prophylaxis:  SCDs Start: 02/26/21 1735 Place and maintain sequential compression device Start: 02/26/21 1459 apixaban (ELIQUIS) tablet 5 mg  Code Status: DNR  Family Communication: No family at bedside  Disposition Plan:  Status is: Inpatient  Remains inpatient appropriate because: resp failure, on IV solumedrol, needs PT       Consultants:  None  Procedures:  None   Antimicrobials:  Anti-infectives (From admission, onward)    Start     Dose/Rate Route Frequency Ordered Stop   02/27/21 1400  azithromycin (ZITHROMAX) 500 mg in sodium chloride 0.9 % 250 mL IVPB        500 mg 250 mL/hr over 60 Minutes Intravenous Every 24 hours 02/26/21 1745 03/03/21 1359   02/26/21 2200  ceFEPIme (MAXIPIME) 2 g in sodium chloride 0.9 % 100 mL IVPB  Status:  Discontinued        2 g 200 mL/hr over 30 Minutes Intravenous Every 12 hours 02/26/21 1757 02/27/21 1414   02/26/21 2000  ceFEPIme (MAXIPIME) 2 g in sodium chloride 0.9 % 100 mL IVPB  Status:  Discontinued        2 g 200 mL/hr over 30 Minutes Intravenous Every 8 hours 02/26/21 1745 02/26/21 1756   02/26/21 1230  ceFEPIme (MAXIPIME) 2 g in sodium  chloride 0.9 % 100 mL IVPB        2 g 200 mL/hr over 30 Minutes Intravenous  Once 02/26/21 1225 02/26/21 1508   02/26/21 1230  azithromycin (ZITHROMAX) 500 mg in sodium chloride 0.9 % 250 mL IVPB        500 mg 250 mL/hr over 60 Minutes Intravenous  Once 02/26/21 1225 02/26/21 1813        Objective: Vitals:   02/28/21 0700 02/28/21 0800 02/28/21 0900 02/28/21 1200  BP:  107/69 103/70 100/67 111/74  Pulse: 79 88 91 82  Resp: (!) 23 (!) 26 (!) 25 (!) 24  Temp:  97.6 F (36.4 C)  98.1 F (36.7 C)  TempSrc:  Oral  Oral  SpO2: 91% 91% 96% 95%  Weight:      Height:        Intake/Output Summary (Last 24 hours) at 02/28/2021 1329 Last data filed at 02/28/2021 1000 Gross per 24 hour  Intake 869.88 ml  Output 600 ml  Net 269.88 ml    Filed Weights   02/26/21 1800 02/27/21 0500 02/28/21 0227  Weight: 80.9 kg 82.8 kg 84.2 kg    Examination:  General exam: Appears calm, fatigued  Respiratory system: Diminished breath sounds bilaterally anteriorly with some stridor  Cardiovascular system: S1 & S2 heard, irregular rhythm with rate 70s. No murmurs. No pedal edema. Gastrointestinal system: Abdomen is nondistended, soft and nontender. Normal bowel sounds heard. Central nervous system: Alert and oriented. No focal neurological deficits. Speech clear.  Extremities: Symmetric in appearance  Skin: No rashes, lesions or ulcers on exposed skin  Psychiatry: Judgement and insight appear normal. Mood & affect appropriate.   Data Reviewed: I have personally reviewed following labs and imaging studies  CBC: Recent Labs  Lab 02/26/21 1150 02/27/21 0251 02/28/21 0420  WBC 18.5* 13.7* 19.2*  HGB 12.5 10.8* 9.9*  HCT 42.5 36.1 33.6*  MCV 79.6* 78.1* 78.7*  PLT 532* 477* 435*    Basic Metabolic Panel: Recent Labs  Lab 02/26/21 1150 02/27/21 0251 02/27/21 1211 02/28/21 0420  NA 140 136  --  138  K 4.9 5.5* 4.9 4.2  CL 105 105  --  105  CO2 25 24  --  26  GLUCOSE 153* 227*  --  189*  BUN 18 23  --  42*  CREATININE 1.03* 1.17*  --  1.24*  CALCIUM 10.0 9.1  --  9.2    GFR: Estimated Creatinine Clearance: 41.4 mL/min (A) (by C-G formula based on SCr of 1.24 mg/dL (H)). Liver Function Tests: Recent Labs  Lab 02/26/21 1150  AST 36  ALT 26  ALKPHOS 69  BILITOT 1.1  PROT 7.8  ALBUMIN 3.7    No results for input(s): LIPASE, AMYLASE in the last 168  hours. No results for input(s): AMMONIA in the last 168 hours. Coagulation Profile: Recent Labs  Lab 02/26/21 1714  INR 1.7*    Cardiac Enzymes: No results for input(s): CKTOTAL, CKMB, CKMBINDEX, TROPONINI in the last 168 hours. BNP (last 3 results) No results for input(s): PROBNP in the last 8760 hours. HbA1C: No results for input(s): HGBA1C in the last 72 hours. CBG: Recent Labs  Lab 02/27/21 0331 02/27/21 1625 02/27/21 2113 02/28/21 0735 02/28/21 1136  GLUCAP 188* 203* 183* 178* 224*    Lipid Profile: No results for input(s): CHOL, HDL, LDLCALC, TRIG, CHOLHDL, LDLDIRECT in the last 72 hours. Thyroid Function Tests: No results for input(s): TSH, T4TOTAL, FREET4, T3FREE, THYROIDAB in the last 72 hours.  Anemia Panel: No results for input(s): VITAMINB12, FOLATE, FERRITIN, TIBC, IRON, RETICCTPCT in the last 72 hours. Sepsis Labs: Recent Labs  Lab 02/26/21 1628 02/26/21 1714 02/26/21 1952 02/27/21 0017 02/27/21 0251  PROCALCITON  --  0.14  --   --   --   LATICACIDVEN 2.6*  --  2.9* 2.3* 2.7*     Recent Results (from the past 240 hour(s))  Resp Panel by RT-PCR (Flu A&B, Covid) Nasopharyngeal Swab     Status: None   Collection Time: 02/26/21 11:50 AM   Specimen: Nasopharyngeal Swab; Nasopharyngeal(NP) swabs in vial transport medium  Result Value Ref Range Status   SARS Coronavirus 2 by RT PCR NEGATIVE NEGATIVE Final    Comment: (NOTE) SARS-CoV-2 target nucleic acids are NOT DETECTED.  The SARS-CoV-2 RNA is generally detectable in upper respiratory specimens during the acute phase of infection. The lowest concentration of SARS-CoV-2 viral copies this assay can detect is 138 copies/mL. A negative result does not preclude SARS-Cov-2 infection and should not be used as the sole basis for treatment or other patient management decisions. A negative result may occur with  improper specimen collection/handling, submission of specimen other than nasopharyngeal swab,  presence of viral mutation(s) within the areas targeted by this assay, and inadequate number of viral copies(<138 copies/mL). A negative result must be combined with clinical observations, patient history, and epidemiological information. The expected result is Negative.  Fact Sheet for Patients:  EntrepreneurPulse.com.au  Fact Sheet for Healthcare Providers:  IncredibleEmployment.be  This test is no t yet approved or cleared by the [place] FDA and  has been authorized for detection and/or diagnosis of SARS-CoV-2 by FDA under an Emergency Use Authorization (EUA). This EUA will remain  in effect (meaning this test can be used) for the duration of the COVID-19 declaration under Section 564(b)(1) of the Act, 21 U.S.C.section 360bbb-3(b)(1), unless the authorization is terminated  or revoked sooner.       Influenza A by PCR NEGATIVE NEGATIVE Final   Influenza B by PCR NEGATIVE NEGATIVE Final    Comment: (NOTE) The Xpert Xpress SARS-CoV-2/FLU/RSV plus assay is intended as an aid in the diagnosis of influenza from Nasopharyngeal swab specimens and should not be used as a sole basis for treatment. Nasal washings and aspirates are unacceptable for Xpert Xpress SARS-CoV-2/FLU/RSV testing.  Fact Sheet for Patients: EntrepreneurPulse.com.au  Fact Sheet for Healthcare Providers: IncredibleEmployment.be  This test is not yet approved or cleared by the [place] FDA and has been authorized for detection and/or diagnosis of SARS-CoV-2 by FDA under an Emergency Use Authorization (EUA). This EUA will remain in effect (meaning this test can be used) for the duration of the COVID-19 declaration under Section 564(b)(1) of the Act, 21 U.S.C. section 360bbb-3(b)(1), unless the authorization is terminated or revoked.  Performed at [facility], [address] [place], [place] [address]   Blood Culture  (routine x 2)     Status: None (Preliminary result)   Collection Time: 02/26/21 11:50 AM   Specimen: BLOOD  Result Value Ref Range Status   Specimen Description BLOOD RIGHT ANTECUBITAL  Final   Special Requests BOTTLES DRAWN AEROBIC AND ANAEROBIC BCAV  Final   Culture   Final    NO GROWTH 2 DAYS Performed at [facility], [address], [place], [place] [address]    Report Status PENDING  Incomplete  Blood Culture (routine x 2)     Status: None (Preliminary result)   Collection Time: 02/26/21 12:06 PM  Specimen: BLOOD  Result Value Ref Range Status   Specimen Description BLOOD LEFT ANTECUBITAL  Final   Special Requests BOTTLES DRAWN AEROBIC AND ANAEROBIC BCAV  Final   Culture   Final    NO GROWTH 2 DAYS Performed at [facility], [address] [place], [place] [address]    Report Status PENDING  Incomplete  MRSA Next Gen by PCR, Nasal     Status: None   Collection Time: 02/26/21  7:43 PM   Specimen: Nasal Mucosa; Nasal Swab  Result Value Ref Range Status   MRSA by PCR Next Gen NOT DETECTED NOT DETECTED Final    Comment: (NOTE) The GeneXpert MRSA Assay (FDA approved for NASAL specimens only), is one component of a comprehensive MRSA colonization surveillance program. It is not intended to diagnose MRSA infection nor to guide or monitor treatment for MRSA infections. Test performance is not FDA approved in patients less than 41 years old. Performed at [facility], [address] [place], [place] [address]        Radiology Studies: CT HEAD WO CONTRAST (5MM)  Result Date: 02/26/2021 CLINICAL DATA:  Head trauma EXAM: CT HEAD WITHOUT CONTRAST TECHNIQUE: Contiguous axial images were obtained from the base of the skull through the vertex without intravenous contrast. RADIATION DOSE REDUCTION: This exam was performed according to the departmental dose-optimization program which includes automated exposure control, adjustment of the mA and/or kV  according to patient size and/or use of iterative reconstruction technique. COMPARISON:  12/18/2020 FINDINGS: Brain: Redemonstrated sequela of prior large right MCA territory infarcts, with associated encephalomalacia and ex vacuo dilatation of the right lateral ventricle. Additional lacunar infarcts in the bilateral cerebellar hemispheres and pons. No new hypodensity to suggest acute infarct. No acute hemorrhage, mass, mass effect, or midline shift. Periventricular white matter changes, likely the sequela of chronic small vessel ischemic disease. Vascular: No hyperdense vessel. Atherosclerotic calcifications in the intracranial carotid and vertebral arteries. Skull: Normal. Negative for fracture or focal lesion. Sinuses/Orbits: Near-complete opacification of the right maxillary sinus. Partial opacification of the left maxillary sinus, bilateral frontal and sphenoid sinuses, and ethmoid air cells. Status post bilateral lens replacements. No acute orbital abnormality. Other: Fluid in the right mastoid air cells. Left frontal and parietal scalp hematoma. IMPRESSION: 1. No acute intracranial process. Redemonstrated sequela of chronic right MCA infarct. 2. Pansinus disease, which is new from the prior exam, and can be seen in the setting of acute sinusitis. Correlate with symptoms. Electronically Signed   By: Merilyn Baba M.D.   On: 02/26/2021 13:52      Scheduled Meds:  apixaban  5 mg Oral BID   bisoprolol  2.5 mg Oral BID   Chlorhexidine Gluconate Cloth  6 each Topical Q0600   dextromethorphan-guaiFENesin  1 tablet Oral BID   diltiazem  300 mg Oral Daily   famotidine  20 mg Oral BID   insulin aspart  0-5 Units Subcutaneous QHS   insulin aspart  0-9 Units Subcutaneous TID WC   levothyroxine  200 mcg Oral Daily   LORazepam  1 mg Oral BID   melatonin  5 mg Oral QHS   methylPREDNISolone (SOLU-MEDROL) injection  60 mg Intravenous Daily   PARoxetine  20 mg Oral QPM   umeclidinium-vilanterol  1 puff  Inhalation Daily   Continuous Infusions:  azithromycin Stopped (02/27/21 1557)     LOS: 2 days    Dessa Phi, DO [facility] 02/28/2021, 1:29 PM   Available via Epic secure chat 7am-7pm  After these hours, please refer to coverage provider listed on [email or web address]

## 2021-02-28 NOTE — Progress Notes (Signed)
Patient very somnolent at the beginning of shift. Required extensive encouragement to awaken and take oral meds. Placed on bipap after oral meds given. Patient has removed mask at this time. Placed back on 3L [place]. Will continue to monitor.

## 2021-02-28 NOTE — Progress Notes (Signed)
Chaplain Maggie made initial visit with pt who was calling out for help. She shared concern for needing to be changed because she thought her bedding was wet. Pt's nurse is aware and attending to this concern. Pt shared grief about the recent loss of her son, Summer Bradley. She asked for prayer when chaplain asked if there is anything she needed from the [facility]. Prayer in accord with her baptist tradition was offered. Continued support available as needed. Contact on call chaplain.

## 2021-02-28 NOTE — Plan of Care (Signed)

## 2021-02-28 NOTE — Progress Notes (Signed)
Called report to [facility] for patient to go to room 249.  Daughter at bedside.

## 2021-02-28 NOTE — Progress Notes (Signed)
Patient yelling and swinging pillow telling it to "wake up." Unable to redirect at this time. PRN Ativan given as ordered and patient placed back on bipap. Will continue to monitor.

## 2021-03-01 LAB — CBC
HCT: 35.3 % — ABNORMAL LOW (ref 36.0–46.0)
Hemoglobin: 10.2 g/dL — ABNORMAL LOW (ref 12.0–15.0)
MCH: 22.8 pg — ABNORMAL LOW (ref 26.0–34.0)
MCHC: 28.9 g/dL — ABNORMAL LOW (ref 30.0–36.0)
MCV: 79 fL — ABNORMAL LOW (ref 80.0–100.0)
Platelets: 493 10*3/uL — ABNORMAL HIGH (ref 150–400)
RBC: 4.47 MIL/uL (ref 3.87–5.11)
RDW: 19.2 % — ABNORMAL HIGH (ref 11.5–15.5)
WBC: 17.4 10*3/uL — ABNORMAL HIGH (ref 4.0–10.5)
nRBC: 1.3 % — ABNORMAL HIGH (ref 0.0–0.2)

## 2021-03-01 LAB — GLUCOSE, CAPILLARY
Glucose-Capillary: 156 mg/dL — ABNORMAL HIGH (ref 70–99)
Glucose-Capillary: 149 mg/dL — ABNORMAL HIGH (ref 70–99)
Glucose-Capillary: 251 mg/dL — ABNORMAL HIGH (ref 70–99)
Glucose-Capillary: 270 mg/dL — ABNORMAL HIGH (ref 70–99)

## 2021-03-01 LAB — BASIC METABOLIC PANEL
Anion gap: 10 (ref 5–15)
BUN: 51 mg/dL — ABNORMAL HIGH (ref 8–23)
CO2: 28 mmol/L (ref 22–32)
Calcium: 9.4 mg/dL (ref 8.9–10.3)
Chloride: 102 mmol/L (ref 98–111)
Creatinine, Ser: 1.08 mg/dL — ABNORMAL HIGH (ref 0.44–1.00)
GFR, Estimated: 53 mL/min — ABNORMAL LOW (ref >60)
Glucose, Bld: 172 mg/dL — ABNORMAL HIGH (ref 70–99)
Potassium: 4.2 mmol/L (ref 3.5–5.1)
Sodium: 140 mmol/L (ref 135–145)

## 2021-03-01 MED ORDER — PREDNISONE 50 MG PO TABS
50.0000 mg | ORAL_TABLET | Freq: Every day | ORAL | Status: DC
  Administered 2021-03-02 – 2021-03-05 (×4): 50 mg via ORAL
  Filled 2021-03-01 (×4): qty 1

## 2021-03-01 NOTE — Progress Notes (Signed)
Physical Therapy Treatment Patient Details Name: Summer Bradley MRN: [id number] DOB: [birth year] Today's Date: 03/01/2021   History of Present Illness 76 y.o. female with medical history significant for atrial fibrillation, diastolic CHF, COPD, chronic respiratory failure 3L [place] at home, diabetes, history of R MCA stroke, hypertension, pulmonary hypertension, recent admission 01/18/21 - 01/21/21 for COPD exacerbation, who presents to the emergency department on 02/27/20 with shortness of breath. Patient has chronic shortness of breath but it began to worsen several days before admission, and duration is now constant. Chest x-ray showed cardiomegaly and bibasilar opacities. Head CT showed chronic Right MCA infarct, left frontal and parietal scalp hematomas, and pan-sinus disease. Patient was found to be in A fib with RVR. She was started on an IV diltiazem drip. She was placed on BiPAP.    PT Comments    This session was performed as a co-treatment with OT. The pt demonstrates improved mobility this session as she was able to achieve EOB, begin gait training, and transfer training. During this session the pt completed x2 bed<>chair transfer, x5 sit<>stand transfers, and ambulated 10'x2. She continues to be impulsive in nature requiring chair follow for gait and hands on assistance for all mobility d/t poor safety awareness. Current plan remain appropriate. PT will continue to follow.     Recommendations for follow up therapy are one component of a multi-disciplinary discharge planning process, led by the attending physician.  Recommendations may be updated based on patient status, additional functional criteria and insurance authorization.  Follow Up Recommendations  Skilled nursing-short term rehab (<3 hours/day)     Assistance Recommended at Discharge Frequent or constant Supervision/Assistance  Patient can return home with the following Two people to help with walking and/or transfers;Two people  to help with bathing/dressing/bathroom;Assistance with feeding;Assistance with cooking/housework;Assist for transportation;Help with stairs or ramp for entrance   Equipment Recommendations  None recommended by PT    Recommendations for Other Services       Precautions / Restrictions Precautions Precautions: Fall Restrictions Weight Bearing Restrictions: No     Mobility  Bed Mobility Overal bed mobility: Needs Assistance Bed Mobility: Supine to Sit     Supine to sit: Total assist (During eval patient stating she wanted to sit up, however when facilitated towards the EOB, she resisted motion stating that she was going to fall, but still wanted to sit up.) Sit to supine: Max assist;+2 for physical assistance   General bed mobility comments: Pt sitting at EOB with OT upon arrival in room for assistance with mobility.    Transfers Overall transfer level: Needs assistance Equipment used: Rolling walker (2 wheels) Transfers: Sit to/from Stand Sit to Stand: Min assist           General transfer comment: waxes/wanes with STS with RW d/t confusion/cogntive status.    Ambulation/Gait Ambulation/Gait assistance: Min assist;Mod assist Gait Distance (Feet): 10 Feet Assistive device: Rolling walker (2 wheels) (chair follow.) Gait Pattern/deviations: Step-to pattern       General Gait Details: Cognitive status limiting progress of gait training. She is able to ambulate 10'x2 with impulsive sitting. Pt stating that she "can not walk" during ambulation.   Stairs             Wheelchair Mobility    Modified Rankin (Stroke Patients Only)       Balance Overall balance assessment: Needs assistance Sitting-balance support: Bilateral upper extremity supported Sitting balance-Leahy Scale: Fair     Standing balance support: Bilateral upper extremity supported Standing balance-Leahy  Scale: Poor                              Cognition Arousal/Alertness:  Awake/alert Behavior During Therapy: Restless;Impulsive Overall Cognitive Status: No family/caregiver present to determine baseline cognitive functioning                                 General Comments: unsure of cognitive baseline, but pt's attn waxes and wanes and she has periods of anxiety during session. WILL TRY TO SIT WITHOUT WARNING        Exercises Other Exercises Other Exercises: Recall exercises: pt able to recall 3 words without any assistance within <5 min of being given words. However >5 min she is unable to recall words even with prompting.    General Comments        Pertinent Vitals/Pain Pain Assessment: No/denies pain Breathing: normal Negative Vocalization: occasional moan/groan, low speech, negative/disapproving quality Facial Expression: sad, frightened, frown Body Language: tense, distressed pacing, fidgeting Consolability: distracted or reassured by voice/touch PAINAD Score: 4 Pain Location: unable to denote where or qualify/quantify, grimaces some with mobility. Pain Descriptors / Indicators: Grimacing Pain Intervention(s): Monitored during session;Repositioned    Home Living Family/patient expects to be discharged to:: Unsure Living Arrangements:  (Unknown; family not present, patient poor historian)                      Prior Function            PT Goals (current goals can now be found in the care plan section) Acute Rehab PT Goals Patient Stated Goal: To get up PT Goal Formulation: With patient Time For Goal Achievement: 03/15/21 Potential to Achieve Goals: Fair Progress towards PT goals: Progressing toward goals    Frequency    Min 2X/week      PT Plan Current plan remains appropriate    Co-evaluation   Reason for Co-Treatment: Complexity of the patient's impairments (multi-system involvement);Necessary to address cognition/behavior during functional activity;To address functional/ADL transfers;For  patient/therapist safety PT goals addressed during session: Mobility/safety with mobility;Proper use of DME OT goals addressed during session: ADL's and self-care      AM-PAC PT "6 Clicks" Mobility   Outcome Measure  Help needed turning from your back to your side while in a flat bed without using bedrails?: A Little Help needed moving from lying on your back to sitting on the side of a flat bed without using bedrails?: A Lot Help needed moving to and from a bed to a chair (including a wheelchair)?: A Little Help needed standing up from a chair using your arms (e.g., wheelchair or bedside chair)?: A Little Help needed to walk in hospital room?: A Lot Help needed climbing 3-5 steps with a railing? : A Lot 6 Click Score: 15    End of Session Equipment Utilized During Treatment: Gait belt;Oxygen Activity Tolerance: Patient limited by fatigue Patient left: in bed;with bed alarm set Nurse Communication: Mobility status PT Visit Diagnosis: Muscle weakness (generalized) (M62.81);Difficulty in walking, not elsewhere classified (R26.2);Unsteadiness on feet (R26.81)     Time: 1113-1140 PT Time Calculation (min) (ACUTE ONLY): 27 min  Charges:  $Gait Training: 8-22 mins $Therapeutic Activity: 8-22 mins                     4:26 PM, 03/01/21 Deisha Stull A. Saverio Danker, PT, DPT Physical  Therapist - [facility]    Elysia Grand A Tyheem Boughner 03/01/2021, 4:24 PM

## 2021-03-01 NOTE — Evaluation (Signed)
Physical Therapy Evaluation Patient Details Name: Summer Bradley MRN: [id number] DOB: 1945/11/23 Today's Date: 03/01/2021  History of Present Illness  76 y.o. female with medical history significant for atrial fibrillation, diastolic CHF, COPD, chronic respiratory failure 3L [place] at home, diabetes, history of R MCA stroke, hypertension, pulmonary hypertension, recent admission 01/18/21 - 01/21/21 for COPD exacerbation, who presents to the emergency department on 02/27/20 with shortness of breath. Patient has chronic shortness of breath but it began to worsen several days before admission, and duration is now constant. Chest x-ray showed cardiomegaly and bibasilar opacities. Head CT showed chronic Right MCA infarct, left frontal and parietal scalp hematomas, and pan-sinus disease. Patient was found to be in A fib with RVR. She was started on an IV diltiazem drip. She was placed on BiPAP.  Clinical Impression  During initial evaluation the pt's cognitive deficits greatly limit functional mobility. The pt reports wanting to sit up and even to get OOB, however once attempting to assist patient with moving to the EOB she resists all motion, d/t a fear of fall. The pt is positioned in chair position in the bed to assist with pulmonary health, RN alerted. OT notified of current mobility needs for safety. Recommend SNF.     Recommendations for follow up therapy are one component of a multi-disciplinary discharge planning process, led by the attending physician.  Recommendations may be updated based on patient status, additional functional criteria and insurance authorization.  Follow Up Recommendations Skilled nursing-short term rehab (<3 hours/day)    Assistance Recommended at Discharge Frequent or constant Supervision/Assistance  Patient can return home with the following  Two people to help with walking and/or transfers;Two people to help with bathing/dressing/bathroom;Assistance with feeding;Assistance  with cooking/housework;Assist for transportation;Help with stairs or ramp for entrance    Equipment Recommendations None recommended by PT  Recommendations for Other Services       Functional Status Assessment Patient has had a recent decline in their functional status and/or demonstrates limited ability to make significant improvements in function in a reasonable and predictable amount of time     Precautions / Restrictions Precautions Precautions: Fall Restrictions Weight Bearing Restrictions: No      Mobility  Bed Mobility Overal bed mobility: Needs Assistance Bed Mobility: Supine to Sit     Supine to sit: Total assist (During eval patient stating she wanted to sit up, however when facilitated towards the EOB, she resisted motion stating that she was going to fall, but still wanted to sit up.) Sit to supine: Max assist;+2 for physical assistance   General bed mobility comments: cues throughout    Transfers Overall transfer level: Needs assistance Equipment used: Rolling walker (2 wheels) Transfers: Sit to/from Stand             General transfer comment: waxes/wanes with STS with RW d/t confusion/cogntive status.    Ambulation/Gait                  Stairs            Wheelchair Mobility    Modified Rankin (Stroke Patients Only)       Balance Overall balance assessment: Needs assistance Sitting-balance support: Bilateral upper extremity supported Sitting balance-Leahy Scale: Fair     Standing balance support: Bilateral upper extremity supported Standing balance-Leahy Scale: Poor                               Pertinent Vitals/Pain  Pain Assessment: No/denies pain Breathing: normal Negative Vocalization: occasional moan/groan, low speech, negative/disapproving quality Facial Expression: sad, frightened, frown Body Language: tense, distressed pacing, fidgeting Consolability: distracted or reassured by voice/touch PAINAD  Score: 4 Pain Location: unable to denote where or qualify/quantify, grimaces some with mobility. Pain Descriptors / Indicators: Grimacing Pain Intervention(s): Monitored during session;Repositioned    Home Living Family/patient expects to be discharged to:: Unsure Living Arrangements:  (Unknown; family not present, patient poor historian)                      Prior Function Prior Level of Function : Patient poor historian/Family not available             Mobility Comments: Pt is apparently [facility] program member per chart review.       Hand Dominance        Extremity/Trunk Assessment   Upper Extremity Assessment Upper Extremity Assessment: Generalized weakness    Lower Extremity Assessment Lower Extremity Assessment: Generalized weakness       Communication   Communication: No difficulties  Cognition Arousal/Alertness: Awake/alert Behavior During Therapy: Restless;Impulsive Overall Cognitive Status: No family/caregiver present to determine baseline cognitive functioning                                 General Comments: unsure of cognitive baseline, but pt's attn waxes and wanes and she has periods of anxiety during session. WILL TRY TO SIT WITHOUT WARNING        General Comments      Exercises     Assessment/Plan    PT Assessment Patient needs continued PT services  PT Problem List Decreased strength;Decreased range of motion;Decreased knowledge of precautions;Decreased cognition;Decreased activity tolerance;Decreased balance;Decreased mobility;Decreased safety awareness       PT Treatment Interventions      PT Goals (Current goals can be found in the Care Plan section)  Acute Rehab PT Goals Patient Stated Goal: To get up PT Goal Formulation: With patient Time For Goal Achievement: 03/15/21 Potential to Achieve Goals: Fair    Frequency Min 2X/week     Co-evaluation   Reason for Co-Treatment: Complexity of the patient's  impairments (multi-system involvement) PT goals addressed during session: Mobility/safety with mobility OT goals addressed during session: ADL's and self-care       AM-PAC PT "6 Clicks" Mobility  Outcome Measure Help needed turning from your back to your side while in a flat bed without using bedrails?: Total Help needed moving from lying on your back to sitting on the side of a flat bed without using bedrails?: Total Help needed moving to and from a bed to a chair (including a wheelchair)?: Total Help needed standing up from a chair using your arms (e.g., wheelchair or bedside chair)?: Total Help needed to walk in hospital room?: Total Help needed climbing 3-5 steps with a railing? : Total 6 Click Score: 6    End of Session   Activity Tolerance: Treatment limited secondary to medical complications (Comment) (limited 2/2 cognitive status) Patient left: in bed;with bed alarm set (chair position) Nurse Communication: Mobility status PT Visit Diagnosis: Muscle weakness (generalized) (M62.81);Difficulty in walking, not elsewhere classified (R26.2);Unsteadiness on feet (R26.81)    Time: 4166-0630 PT Time Calculation (min) (ACUTE ONLY): 24 min   Charges:   PT Evaluation $PT Eval Moderate Complexity: 1 Mod PT Treatments $Therapeutic Activity: 8-22 mins        4:18 PM,  03/01/21 Harleigh Civello A. Saverio Danker PT, DPT Physical Therapist - [facility]   Adyan Palau A Marenda Accardi 03/01/2021, 4:16 PM

## 2021-03-01 NOTE — Evaluation (Signed)
Occupational Therapy Evaluation Patient Details Name: Summer Bradley MRN: [id number] DOB: Jun 06, 1945 Today's Date: 03/01/2021   History of Present Illness 76 y/o F with PMH: DM2, HTN, HLD, dCHF, Afib, COPD on 3L chronically, h/o CVA on Eliquis, pulmonary HTN. Recent admission in early December 2022 for COPD exacerbation. To [facility] this admission with similar presentaion. Adm for acute on chronic respiratory failure with hypoxia.   Clinical Impression   Pt seen for OT evaluation this date in setting of acute hstpaizliation d/t hypoxia. She presents with significant confusion and generalized deconditioning. Pt is poor historian 2/2 confusion so unsure of her functional basleine. She currently requires: MIN A for UB ADLs in supported sitting including self-feeding/drinking, MAX/TOTAL A for LB ADLs bed level including donning socks. She does CTS with MIN A for one LB ADL-clothing mgt over hips. Soiled mesh underwear removed with MAX A. Pt assist level for transfers waxes and wanes from MIN to MAX, but she is impulsive so 2p assist needed for chair follow for safety as pt will sit w/o warning. RW used for balance. Pt sat up in chair briefly, but d/t significant impulsivity and fall risk, PT/OT returned pt to bed with alarm set. All needs in reach. RN informed of session contents.      Recommendations for follow up therapy are one component of a multi-disciplinary discharge planning process, led by the attending physician.  Recommendations may be updated based on patient status, additional functional criteria and insurance authorization.   Follow Up Recommendations  Skilled nursing-short term rehab (<3 hours/day)    Assistance Recommended at Discharge Frequent or constant Supervision/Assistance  Patient can return home with the following A lot of help with walking and/or transfers;A lot of help with bathing/dressing/bathroom;Assistance with cooking/housework;Direct supervision/assist for medications  management;Direct supervision/assist for financial management;Assist for transportation;Help with stairs or ramp for entrance    Functional Status Assessment  Patient has had a recent decline in their functional status and demonstrates the ability to make significant improvements in function in a reasonable and predictable amount of time.  Equipment Recommendations  BSC/3in1;Tub/shower seat;Other (comment) (2ww)    Recommendations for Other Services       Precautions / Restrictions Precautions Precautions: Fall Restrictions Weight Bearing Restrictions: No      Mobility Bed Mobility Overal bed mobility: Needs Assistance Bed Mobility: Supine to Sit;Sit to Supine     Supine to sit: Min assist;Mod assist Sit to supine: Max assist;+2 for physical assistance   General bed mobility comments: cues throughout    Transfers Overall transfer level: Needs assistance Equipment used: Rolling walker (2 wheels) Transfers: Sit to/from Stand             General transfer comment: waxes/wanes with STS with RW d/t confusion/cogntive status.      Balance Overall balance assessment: Needs assistance Sitting-balance support: Bilateral upper extremity supported Sitting balance-Leahy Scale: Fair     Standing balance support: Bilateral upper extremity supported Standing balance-Leahy Scale: Poor                             ADL either performed or assessed with clinical judgement   ADL                                         General ADL Comments: MIN A for UB ADLs in supported sitting including self-feeding/drinking,  MAX/TOTAL A for LB ADLs bed level including donning socks. She does CTS with MIN A for one LB ADL-clothing mgt over hips. Soiled mesh underwear removed with MAX A. Pt assist level for transfers waxes and wanes from MIN to MAX, but she is impulsive so 2p assist needed for chair follow for safety as pt will sit w/o warning. RW used for balance.      Vision   Additional Comments: difficult to formally assess d/t cognition     Perception     Praxis      Pertinent Vitals/Pain Pain Assessment: PAINAD Breathing: normal Negative Vocalization: occasional moan/groan, low speech, negative/disapproving quality Facial Expression: sad, frightened, frown Body Language: tense, distressed pacing, fidgeting Consolability: distracted or reassured by voice/touch PAINAD Score: 4 Pain Location: unable to denote where or qualify/quantify, grimaces some with mobility. Pain Descriptors / Indicators: Grimacing Pain Intervention(s): Monitored during session;Repositioned     Hand Dominance     Extremity/Trunk Assessment Upper Extremity Assessment Upper Extremity Assessment: Generalized weakness   Lower Extremity Assessment Lower Extremity Assessment: Generalized weakness       Communication     Cognition Arousal/Alertness:  (waxing and waning asleep or drwosy with awake and anxious) Behavior During Therapy: Restless;Anxious;Impulsive (sometimes able to attend and partiicpate, sometimes anxious and yelling "help me" and "momma") Overall Cognitive Status: No family/caregiver present to determine baseline cognitive functioning                                 General Comments: unsure of cognitive baseline, but pt's attn waxes and wanes and she has periods of anxiety during session. WILL TRY TO SIT WITHOUT WARNING     General Comments       Exercises     Shoulder Instructions      Home Living Family/patient expects to be discharged to:: Unsure                                        Prior Functioning/Environment Prior Level of Function : Patient poor historian/Family not available             Mobility Comments: Pt is apparently [facility] program member per chart review.          OT Problem List: Decreased strength;Decreased activity tolerance;Impaired balance (sitting and/or standing);Decreased  cognition;Decreased safety awareness;Decreased knowledge of use of DME or AE;Obesity;Increased edema      OT Treatment/Interventions: Self-care/ADL training;Therapeutic exercise;DME and/or AE instruction;Therapeutic activities;Patient/family education;Balance training    OT Goals(Current goals can be found in the care plan section) Acute Rehab OT Goals Patient Stated Goal: none stated OT Goal Formulation: Patient unable to participate in goal setting Time For Goal Achievement: 03/15/21 Potential to Achieve Goals: Fair ADL Goals Pt Will Perform Upper Body Dressing: with set-up Pt Will Perform Lower Body Dressing: with min guard assist;sit to/from stand Pt Will Transfer to Toilet: with min guard assist;ambulating Pt Will Perform Toileting - Clothing Manipulation and hygiene: with min guard assist;sitting/lateral leans  OT Frequency: Min 2X/week    Co-evaluation PT/OT/SLP Co-Evaluation/Treatment: Yes Reason for Co-Treatment: Complexity of the patient's impairments (multi-system involvement) PT goals addressed during session: Mobility/safety with mobility OT goals addressed during session: ADL's and self-care      AM-PAC OT "6 Clicks" Daily Activity     Outcome Measure Help from another person eating meals?: A Little  Help from another person taking care of personal grooming?: A Lot Help from another person toileting, which includes using toliet, bedpan, or urinal?: A Lot Help from another person bathing (including washing, rinsing, drying)?: A Lot Help from another person to put on and taking off regular upper body clothing?: A Lot Help from another person to put on and taking off regular lower body clothing?: Total 6 Click Score: 12   End of Session Equipment Utilized During Treatment: Gait belt;Rolling walker (2 wheels);Oxygen Nurse Communication: Mobility status  Activity Tolerance: Patient tolerated treatment well Patient left: with call bell/phone within reach;with bed alarm  set  OT Visit Diagnosis: Unsteadiness on feet (R26.81);Muscle weakness (generalized) (M62.81);History of falling (Z91.81)                Time: 3612-2449 OT Time Calculation (min): 37 min Charges:  OT General Charges $OT Visit: 1 Visit OT Evaluation $OT Eval Moderate Complexity: 1 Mod OT Treatments $Self Care/Home Management : 8-22 mins  Gerrianne Scale, MS, OTR/L ascom [phone] 03/01/21, 1:49 PM

## 2021-03-01 NOTE — Progress Notes (Signed)
Has no PROGRESS NOTE    Abrea Henle  HYI:[id number] DOB: [birth year] DOA: 02/26/2021 PCP: [facility]     Brief Narrative:  Summer Bradley is a 76 y.o. female with medical history significant for atrial fibrillation, diastolic CHF, COPD, chronic respiratory failure 3L  at home, diabetes, history of R MCA stroke, hypertension, pulmonary hypertension, recent admission 01/18/21 - 01/21/21 for COPD exacerbation, who presents to the emergency department on 02/27/20 with shortness of breath. Patient has chronic shortness of breath but it began to worsen several days before admission, and duration is now constant. Chest x-ray showed cardiomegaly and bibasilar opacities. Head CT showed chronic Right MCA infarct, left frontal and parietal scalp hematomas, and pan-sinus disease. Patient was found to be in A fib with RVR. She was started on an IV diltiazem drip. She was placed on BiPAP. Blood cultures were drawn. Patient was given Duo-nebs, IV methylprednisolone, IV cefepime, and IV azithromycin.   PACE # [phone]  New events last 24 hours / Subjective: Patient has not required BiPAP overnight. She has no complaints today.  I discussed with her that she needs to work with PT and try to get out of bed today.    Assessment & Plan:   Principal Problem:   Acute and chronic respiratory failure with hypoxia (HCC) Active Problems:   Essential hypertension   COPD with acute exacerbation (HCC)   Chronic heart failure with preserved ejection fraction (HFpEF) (HCC)   Atrial fibrillation with rapid ventricular response (HCC)   Hypothyroidism   GERD (gastroesophageal reflux disease)   Lactic acidosis   Pneumonia due to gram-negative bacteria (HCC)   Acute on chronic hypoxemic respiratory failure -Requires 3 L nasal cannula O2 at baseline -Required BiPAP on admission, now weaned down to 3 L oxygen   COPD exacerbation -Continue azithromycin, breathing tx. Deescalate to  prednisone tomorrow morning   A. fib RVR, persistent -Now weaned off Cardizem drip -Continue p.o. Cardizem, Eliquis -HR well controlled  CKD stage IIIa -Baseline creatinine 0.8-0.9 -Stable   Fall at home -CT head without acute intracranial process -PT eval   History of CVA -Continue eliquis   Hypothyroidism -Continue Synthroid  Anxiety -Continue Ativan  Leukocytosis -In setting of steroid use     DVT prophylaxis:  SCDs Start: 02/26/21 1735 Place and maintain sequential compression device Start: 02/26/21 1459 apixaban (ELIQUIS) tablet 5 mg  Code Status: DNR  Family Communication: Daughter by phone  Disposition Plan:  Status is: Inpatient  Remains inpatient appropriate because: needs PT    Consultants:  None  Procedures:  None   Antimicrobials:  Anti-infectives (From admission, onward)    Start     Dose/Rate Route Frequency Ordered Stop   02/27/21 1400  azithromycin (ZITHROMAX) 500 mg in sodium chloride 0.9 % 250 mL IVPB        500 mg 250 mL/hr over 60 Minutes Intravenous Every 24 hours 02/26/21 1745 03/03/21 1359   02/26/21 2200  ceFEPIme (MAXIPIME) 2 g in sodium chloride 0.9 % 100 mL IVPB  Status:  Discontinued        2 g 200 mL/hr over 30 Minutes Intravenous Every 12 hours 02/26/21 1757 02/27/21 1414   02/26/21 2000  ceFEPIme (MAXIPIME) 2 g in sodium chloride 0.9 % 100 mL IVPB  Status:  Discontinued        2 g 200 mL/hr over 30 Minutes Intravenous Every 8 hours 02/26/21 1745 02/26/21 1756   02/26/21 1230  ceFEPIme (MAXIPIME) 2 g in sodium  chloride 0.9 % 100 mL IVPB        2 g 200 mL/hr over 30 Minutes Intravenous  Once 02/26/21 1225 02/26/21 1508   02/26/21 1230  azithromycin (ZITHROMAX) 500 mg in sodium chloride 0.9 % 250 mL IVPB        500 mg 250 mL/hr over 60 Minutes Intravenous  Once 02/26/21 1225 02/26/21 1813        Objective: Vitals:   03/01/21 0500 03/01/21 0509 03/01/21 0526 03/01/21 0742  BP:  125/80  121/76  Pulse:  86  87  Resp:   (!) 24  17  Temp:  (!) 97.5 F (36.4 C)  97.9 F (36.6 C)  TempSrc:    Oral  SpO2:  (!) 82% 95% 99%  Weight: 85 kg     Height:        Intake/Output Summary (Last 24 hours) at 03/01/2021 1040 Last data filed at 02/28/2021 1900 Gross per 24 hour  Intake 120 ml  Output 600 ml  Net -480 ml    Filed Weights   02/27/21 0500 02/28/21 0227 03/01/21 0500  Weight: 82.8 kg 84.2 kg 85 kg    Examination:  General exam: Appears calm Respiratory system: Diminished breath sounds bilaterally without wheezing, hoarse voice  Cardiovascular system: S1 & S2 heard, irregular rhythm with rate 80s. No murmurs. No pedal edema. Gastrointestinal system: Abdomen is nondistended, soft and nontender. Normal bowel sounds heard. Central nervous system: Alert and oriented. No focal neurological deficits. Speech clear.  Extremities: Symmetric in appearance  Skin: No rashes, lesions or ulcers on exposed skin  Psychiatry: Judgement and insight appear normal. Mood & affect appropriate.   Data Reviewed: I have personally reviewed following labs and imaging studies  CBC: Recent Labs  Lab 02/26/21 1150 02/27/21 0251 02/28/21 0420 03/01/21 0448  WBC 18.5* 13.7* 19.2* 17.4*  HGB 12.5 10.8* 9.9* 10.2*  HCT 42.5 36.1 33.6* 35.3*  MCV 79.6* 78.1* 78.7* 79.0*  PLT 532* 477* 435* 493*    Basic Metabolic Panel: Recent Labs  Lab 02/26/21 1150 02/27/21 0251 02/27/21 1211 02/28/21 0420 03/01/21 0448  NA 140 136  --  138 140  K 4.9 5.5* 4.9 4.2 4.2  CL 105 105  --  105 102  CO2 25 24  --  26 28  GLUCOSE 153* 227*  --  189* 172*  BUN 18 23  --  42* 51*  CREATININE 1.03* 1.17*  --  1.24* 1.08*  CALCIUM 10.0 9.1  --  9.2 9.4    GFR: Estimated Creatinine Clearance: 47.7 mL/min (A) (by C-G formula based on SCr of 1.08 mg/dL (H)). Liver Function Tests: Recent Labs  Lab 02/26/21 1150  AST 36  ALT 26  ALKPHOS 69  BILITOT 1.1  PROT 7.8  ALBUMIN 3.7    No results for input(s): LIPASE, AMYLASE in  the last 168 hours. No results for input(s): AMMONIA in the last 168 hours. Coagulation Profile: Recent Labs  Lab 02/26/21 1714  INR 1.7*    Cardiac Enzymes: No results for input(s): CKTOTAL, CKMB, CKMBINDEX, TROPONINI in the last 168 hours. BNP (last 3 results) No results for input(s): PROBNP in the last 8760 hours. HbA1C: No results for input(s): HGBA1C in the last 72 hours. CBG: Recent Labs  Lab 02/28/21 0735 02/28/21 1136 02/28/21 1654 02/28/21 2002 03/01/21 0745  GLUCAP 178* 224* 248* 242* 156*    Lipid Profile: No results for input(s): CHOL, HDL, LDLCALC, TRIG, CHOLHDL, LDLDIRECT in the last 72 hours. Thyroid Function  Tests: No results for input(s): TSH, T4TOTAL, FREET4, T3FREE, THYROIDAB in the last 72 hours. Anemia Panel: No results for input(s): VITAMINB12, FOLATE, FERRITIN, TIBC, IRON, RETICCTPCT in the last 72 hours. Sepsis Labs: Recent Labs  Lab 02/26/21 1628 02/26/21 1714 02/26/21 1952 02/27/21 0017 02/27/21 0251  PROCALCITON  --  0.14  --   --   --   LATICACIDVEN 2.6*  --  2.9* 2.3* 2.7*     Recent Results (from the past 240 hour(s))  Resp Panel by RT-PCR (Flu A&B, Covid) Nasopharyngeal Swab     Status: None   Collection Time: 02/26/21 11:50 AM   Specimen: Nasopharyngeal Swab; Nasopharyngeal(NP) swabs in vial transport medium  Result Value Ref Range Status   SARS Coronavirus 2 by RT PCR NEGATIVE NEGATIVE Final    Comment: (NOTE) SARS-CoV-2 target nucleic acids are NOT DETECTED.  The SARS-CoV-2 RNA is generally detectable in upper respiratory specimens during the acute phase of infection. The lowest concentration of SARS-CoV-2 viral copies this assay can detect is 138 copies/mL. A negative result does not preclude SARS-Cov-2 infection and should not be used as the sole basis for treatment or other patient management decisions. A negative result may occur with  improper specimen collection/handling, submission of specimen other than  nasopharyngeal swab, presence of viral mutation(s) within the areas targeted by this assay, and inadequate number of viral copies(<138 copies/mL). A negative result must be combined with clinical observations, patient history, and epidemiological information. The expected result is Negative.  Fact Sheet for Patients:  EntrepreneurPulse.com.au  Fact Sheet for Healthcare Providers:  IncredibleEmployment.be  This test is no t yet approved or cleared by the [place] FDA and  has been authorized for detection and/or diagnosis of SARS-CoV-2 by FDA under an Emergency Use Authorization (EUA). This EUA will remain  in effect (meaning this test can be used) for the duration of the COVID-19 declaration under Section 564(b)(1) of the Act, 21 U.S.C.section 360bbb-3(b)(1), unless the authorization is terminated  or revoked sooner.       Influenza A by PCR NEGATIVE NEGATIVE Final   Influenza B by PCR NEGATIVE NEGATIVE Final    Comment: (NOTE) The Xpert Xpress SARS-CoV-2/FLU/RSV plus assay is intended as an aid in the diagnosis of influenza from Nasopharyngeal swab specimens and should not be used as a sole basis for treatment. Nasal washings and aspirates are unacceptable for Xpert Xpress SARS-CoV-2/FLU/RSV testing.  Fact Sheet for Patients: EntrepreneurPulse.com.au  Fact Sheet for Healthcare Providers: IncredibleEmployment.be  This test is not yet approved or cleared by the [place] FDA and has been authorized for detection and/or diagnosis of SARS-CoV-2 by FDA under an Emergency Use Authorization (EUA). This EUA will remain in effect (meaning this test can be used) for the duration of the COVID-19 declaration under Section 564(b)(1) of the Act, 21 U.S.C. section 360bbb-3(b)(1), unless the authorization is terminated or revoked.  Performed at [facility], [address] [place], [place]  [address]   Blood Culture (routine x 2)     Status: None (Preliminary result)   Collection Time: 02/26/21 11:50 AM   Specimen: BLOOD  Result Value Ref Range Status   Specimen Description BLOOD RIGHT ANTECUBITAL  Final   Special Requests BOTTLES DRAWN AEROBIC AND ANAEROBIC BCAV  Final   Culture   Final    NO GROWTH 3 DAYS Performed at [facility], [address] [place], [place] [address]    Report Status PENDING  Incomplete  Blood Culture (routine x 2)  Status: None (Preliminary result)   Collection Time: 02/26/21 12:06 PM   Specimen: BLOOD  Result Value Ref Range Status   Specimen Description BLOOD LEFT ANTECUBITAL  Final   Special Requests BOTTLES DRAWN AEROBIC AND ANAEROBIC BCAV  Final   Culture   Final    NO GROWTH 3 DAYS Performed at [facility], [address], [place], [place] [address]    Report Status PENDING  Incomplete  MRSA Next Gen by PCR, Nasal     Status: None   Collection Time: 02/26/21  7:43 PM   Specimen: Nasal Mucosa; Nasal Swab  Result Value Ref Range Status   MRSA by PCR Next Gen NOT DETECTED NOT DETECTED Final    Comment: (NOTE) The GeneXpert MRSA Assay (FDA approved for NASAL specimens only), is one component of a comprehensive MRSA colonization surveillance program. It is not intended to diagnose MRSA infection nor to guide or monitor treatment for MRSA infections. Test performance is not FDA approved in patients less than 51 years old. Performed at [facility], [address], [place], [place] [address]        Radiology Studies: No results found.    Scheduled Meds:  apixaban  5 mg Oral BID   bisoprolol  2.5 mg Oral BID   Chlorhexidine Gluconate Cloth  6 each Topical Q0600   dextromethorphan-guaiFENesin  1 tablet Oral BID   diltiazem  300 mg Oral Daily   famotidine  20 mg Oral BID   insulin aspart  0-5 Units Subcutaneous QHS   insulin aspart  0-9 Units Subcutaneous TID WC   levothyroxine  200 mcg Oral  Daily   LORazepam  1 mg Oral BID   melatonin  5 mg Oral QHS   methylPREDNISolone (SOLU-MEDROL) injection  60 mg Intravenous Daily   PARoxetine  20 mg Oral QPM   umeclidinium-vilanterol  1 puff Inhalation Daily   Continuous Infusions:  azithromycin Stopped (02/28/21 1535)     LOS: 3 days    Dessa Phi, DO [facility] 03/01/2021, 10:40 AM   Available via Epic secure chat 7am-7pm After these hours, please refer to coverage provider listed on [email or web address]

## 2021-03-02 LAB — BLOOD GAS, ARTERIAL
Acid-Base Excess: 8.9 mmol/L — ABNORMAL HIGH (ref 0.0–2.0)
Bicarbonate: 35.3 mmol/L — ABNORMAL HIGH (ref 20.0–28.0)
FIO2: 0.32
O2 Saturation: 87 %
Patient temperature: 37
pCO2 arterial: 57 mmHg — ABNORMAL HIGH (ref 32.0–48.0)
pH, Arterial: 7.4 (ref 7.350–7.450)
pO2, Arterial: 53 mmHg — ABNORMAL LOW (ref 83.0–108.0)

## 2021-03-02 LAB — GLUCOSE, CAPILLARY
Glucose-Capillary: 182 mg/dL — ABNORMAL HIGH (ref 70–99)
Glucose-Capillary: 221 mg/dL — ABNORMAL HIGH (ref 70–99)
Glucose-Capillary: 353 mg/dL — ABNORMAL HIGH (ref 70–99)

## 2021-03-02 MED ORDER — DILTIAZEM HCL 30 MG PO TABS
150.0000 mg | ORAL_TABLET | Freq: Two times a day (BID) | ORAL | Status: DC
  Administered 2021-03-02 – 2021-03-05 (×6): 150 mg via ORAL
  Filled 2021-03-02 (×8): qty 5

## 2021-03-02 MED ORDER — LORAZEPAM 1 MG PO TABS
1.0000 mg | ORAL_TABLET | Freq: Every day | ORAL | Status: DC
  Administered 2021-03-02 – 2021-03-04 (×3): 1 mg via ORAL
  Filled 2021-03-02 (×3): qty 1

## 2021-03-02 MED ORDER — LORAZEPAM 0.5 MG PO TABS
0.5000 mg | ORAL_TABLET | Freq: Every morning | ORAL | Status: DC
  Administered 2021-03-03 – 2021-03-05 (×3): 0.5 mg via ORAL
  Filled 2021-03-02 (×3): qty 1

## 2021-03-02 NOTE — Progress Notes (Addendum)
Has no PROGRESS NOTE    Summer Bradley  VOH:[id number] DOB: 22-Apr-1945 DOA: 02/26/2021 PCP: [facility]     Brief Narrative:  Summer Bradley is a 76 y.o. female with medical history significant for atrial fibrillation, diastolic CHF, COPD, chronic respiratory failure 3L [place] at home, diabetes, history of R MCA stroke, hypertension, pulmonary hypertension, recent admission 01/18/21 - 01/21/21 for COPD exacerbation, who presents to the emergency department on 02/27/20 with shortness of breath. Patient has chronic shortness of breath but it began to worsen several days before admission, and duration is now constant. Chest x-ray showed cardiomegaly and bibasilar opacities. Head CT showed chronic Right MCA infarct, left frontal and parietal scalp hematomas, and pan-sinus disease. Patient was found to be in A fib with RVR. She was started on an IV diltiazem drip. She was placed on BiPAP. Blood cultures were drawn. Patient was given Duo-nebs, IV methylprednisolone, IV cefepime, and IV azithromycin.   PACE # [phone]  New events last 24 hours / Subjective: States breathing is fine. Wants to get out of bed. PT OT recommended SNF.   Assessment & Plan:   Principal Problem:   Acute and chronic respiratory failure with hypoxia (HCC) Active Problems:   Essential hypertension   COPD with acute exacerbation (HCC)   Chronic heart failure with preserved ejection fraction (HFpEF) (HCC)   Atrial fibrillation with rapid ventricular response (HCC)   Hypothyroidism   GERD (gastroesophageal reflux disease)   Lactic acidosis   Pneumonia due to gram-negative bacteria (HCC)   Acute on chronic hypoxemic respiratory failure -Requires 3 L nasal cannula O2 at baseline -Required BiPAP on admission, now weaned down to 3 L oxygen   COPD exacerbation -Continue azithromycin, breathing tx, prednisone  -Check ABG today, daughter reports patient more lethargic today compared to previous and has  not been awake enough to eat   A. fib RVR, persistent -Now weaned off Cardizem drip -Continue p.o. Cardizem, Eliquis -HR well controlled  CKD stage IIIa -Baseline creatinine 0.8-0.9 -Stable   Fall at home -CT head without acute intracranial process -PT eval rec SNF placement   History of CVA -Continue eliquis   Hypothyroidism -Continue Synthroid  Anxiety -Continue Ativan, dose decreased today due to lethargy   Leukocytosis -In setting of steroid use     DVT prophylaxis:  SCDs Start: 02/26/21 1735 Place and maintain sequential compression device Start: 02/26/21 1459 apixaban (ELIQUIS) tablet 5 mg  Code Status: DNR  Family Communication: Daughter at bedside  Disposition Plan:  Status is: Inpatient  Remains inpatient appropriate because: SNF placement pending    Consultants:  None  Procedures:  None   Antimicrobials:  Anti-infectives (From admission, onward)    Start     Dose/Rate Route Frequency Ordered Stop   02/27/21 1400  azithromycin (ZITHROMAX) 500 mg in sodium chloride 0.9 % 250 mL IVPB        500 mg 250 mL/hr over 60 Minutes Intravenous Every 24 hours 02/26/21 1745 03/03/21 1359   02/26/21 2200  ceFEPIme (MAXIPIME) 2 g in sodium chloride 0.9 % 100 mL IVPB  Status:  Discontinued        2 g 200 mL/hr over 30 Minutes Intravenous Every 12 hours 02/26/21 1757 02/27/21 1414   02/26/21 2000  ceFEPIme (MAXIPIME) 2 g in sodium chloride 0.9 % 100 mL IVPB  Status:  Discontinued        2 g 200 mL/hr over 30 Minutes Intravenous Every 8 hours 02/26/21 1745 02/26/21 1756  02/26/21 1230  ceFEPIme (MAXIPIME) 2 g in sodium chloride 0.9 % 100 mL IVPB        2 g 200 mL/hr over 30 Minutes Intravenous  Once 02/26/21 1225 02/26/21 1508   02/26/21 1230  azithromycin (ZITHROMAX) 500 mg in sodium chloride 0.9 % 250 mL IVPB        500 mg 250 mL/hr over 60 Minutes Intravenous  Once 02/26/21 1225 02/26/21 1813        Objective: Vitals:   03/02/21 0357 03/02/21 0500  03/02/21 0802 03/02/21 0830  BP: (!) 125/91  (!) 129/100   Pulse: 99  89   Resp: 19  20   Temp: 97.8 F (36.6 C)  98.7 F (37.1 C)   TempSrc: Oral     SpO2: 90%  (!) 75% 93%  Weight:  85.2 kg    Height:        Intake/Output Summary (Last 24 hours) at 03/02/2021 1030 Last data filed at 03/01/2021 2200 Gross per 24 hour  Intake 720 ml  Output 900 ml  Net -180 ml    Filed Weights   02/28/21 0227 03/01/21 0500 03/02/21 0500  Weight: 84.2 kg 85 kg 85.2 kg    Examination:  General exam: Appears calm Respiratory system: Diminished breath sounds bilaterally Cardiovascular system: S1 & S2 heard, irregular rhythm with rate 90s. No murmurs. No pedal edema. Gastrointestinal system: Abdomen is nondistended, soft and nontender. Normal bowel sounds heard. Central nervous system: Alert Extremities: Symmetric in appearance  Skin: No rashes, lesions or ulcers on exposed skin  Psychiatry: Mood & affect appropriate.   Data Reviewed: I have personally reviewed following labs and imaging studies  CBC: Recent Labs  Lab 02/26/21 1150 02/27/21 0251 02/28/21 0420 03/01/21 0448  WBC 18.5* 13.7* 19.2* 17.4*  HGB 12.5 10.8* 9.9* 10.2*  HCT 42.5 36.1 33.6* 35.3*  MCV 79.6* 78.1* 78.7* 79.0*  PLT 532* 477* 435* 493*    Basic Metabolic Panel: Recent Labs  Lab 02/26/21 1150 02/27/21 0251 02/27/21 1211 02/28/21 0420 03/01/21 0448  NA 140 136  --  138 140  K 4.9 5.5* 4.9 4.2 4.2  CL 105 105  --  105 102  CO2 25 24  --  26 28  GLUCOSE 153* 227*  --  189* 172*  BUN 18 23  --  42* 51*  CREATININE 1.03* 1.17*  --  1.24* 1.08*  CALCIUM 10.0 9.1  --  9.2 9.4    GFR: Estimated Creatinine Clearance: 47.8 mL/min (A) (by C-G formula based on SCr of 1.08 mg/dL (H)). Liver Function Tests: Recent Labs  Lab 02/26/21 1150  AST 36  ALT 26  ALKPHOS 69  BILITOT 1.1  PROT 7.8  ALBUMIN 3.7    No results for input(s): LIPASE, AMYLASE in the last 168 hours. No results for input(s): AMMONIA  in the last 168 hours. Coagulation Profile: Recent Labs  Lab 02/26/21 1714  INR 1.7*    Cardiac Enzymes: No results for input(s): CKTOTAL, CKMB, CKMBINDEX, TROPONINI in the last 168 hours. BNP (last 3 results) No results for input(s): PROBNP in the last 8760 hours. HbA1C: No results for input(s): HGBA1C in the last 72 hours. CBG: Recent Labs  Lab 03/01/21 0745 03/01/21 1208 03/01/21 1611 03/01/21 2120 03/02/21 0801  GLUCAP 156* 149* 251* 270* 182*    Lipid Profile: No results for input(s): CHOL, HDL, LDLCALC, TRIG, CHOLHDL, LDLDIRECT in the last 72 hours. Thyroid Function Tests: No results for input(s): TSH, T4TOTAL, FREET4, T3FREE, THYROIDAB  in the last 72 hours. Anemia Panel: No results for input(s): VITAMINB12, FOLATE, FERRITIN, TIBC, IRON, RETICCTPCT in the last 72 hours. Sepsis Labs: Recent Labs  Lab 02/26/21 1628 02/26/21 1714 02/26/21 1952 02/27/21 0017 02/27/21 0251  PROCALCITON  --  0.14  --   --   --   LATICACIDVEN 2.6*  --  2.9* 2.3* 2.7*     Recent Results (from the past 240 hour(s))  Resp Panel by RT-PCR (Flu A&B, Covid) Nasopharyngeal Swab     Status: None   Collection Time: 02/26/21 11:50 AM   Specimen: Nasopharyngeal Swab; Nasopharyngeal(NP) swabs in vial transport medium  Result Value Ref Range Status   SARS Coronavirus 2 by RT PCR NEGATIVE NEGATIVE Final    Comment: (NOTE) SARS-CoV-2 target nucleic acids are NOT DETECTED.  The SARS-CoV-2 RNA is generally detectable in upper respiratory specimens during the acute phase of infection. The lowest concentration of SARS-CoV-2 viral copies this assay can detect is 138 copies/mL. A negative result does not preclude SARS-Cov-2 infection and should not be used as the sole basis for treatment or other patient management decisions. A negative result may occur with  improper specimen collection/handling, submission of specimen other than nasopharyngeal swab, presence of viral mutation(s) within  the areas targeted by this assay, and inadequate number of viral copies(<138 copies/mL). A negative result must be combined with clinical observations, patient history, and epidemiological information. The expected result is Negative.  Fact Sheet for Patients:  EntrepreneurPulse.com.au  Fact Sheet for Healthcare Providers:  IncredibleEmployment.be  This test is no t yet approved or cleared by the [place] FDA and  has been authorized for detection and/or diagnosis of SARS-CoV-2 by FDA under an Emergency Use Authorization (EUA). This EUA will remain  in effect (meaning this test can be used) for the duration of the COVID-19 declaration under Section 564(b)(1) of the Act, 21 U.S.C.section 360bbb-3(b)(1), unless the authorization is terminated  or revoked sooner.       Influenza A by PCR NEGATIVE NEGATIVE Final   Influenza B by PCR NEGATIVE NEGATIVE Final    Comment: (NOTE) The Xpert Xpress SARS-CoV-2/FLU/RSV plus assay is intended as an aid in the diagnosis of influenza from Nasopharyngeal swab specimens and should not be used as a sole basis for treatment. Nasal washings and aspirates are unacceptable for Xpert Xpress SARS-CoV-2/FLU/RSV testing.  Fact Sheet for Patients: EntrepreneurPulse.com.au  Fact Sheet for Healthcare Providers: IncredibleEmployment.be  This test is not yet approved or cleared by the [place] FDA and has been authorized for detection and/or diagnosis of SARS-CoV-2 by FDA under an Emergency Use Authorization (EUA). This EUA will remain in effect (meaning this test can be used) for the duration of the COVID-19 declaration under Section 564(b)(1) of the Act, 21 U.S.C. section 360bbb-3(b)(1), unless the authorization is terminated or revoked.  Performed at [facility], [address] [place], [place] [address]   Blood Culture (routine x 2)     Status: None  (Preliminary result)   Collection Time: 02/26/21 11:50 AM   Specimen: BLOOD  Result Value Ref Range Status   Specimen Description BLOOD RIGHT ANTECUBITAL  Final   Special Requests BOTTLES DRAWN AEROBIC AND ANAEROBIC BCAV  Final   Culture   Final    NO GROWTH 4 DAYS Performed at [facility], [address] [place], [place] [address]    Report Status PENDING  Incomplete  Blood Culture (routine x 2)     Status: None (Preliminary result)   Collection  Time: 02/26/21 12:06 PM   Specimen: BLOOD  Result Value Ref Range Status   Specimen Description BLOOD LEFT ANTECUBITAL  Final   Special Requests BOTTLES DRAWN AEROBIC AND ANAEROBIC BCAV  Final   Culture   Final    NO GROWTH 4 DAYS Performed at [facility], [address] [place], [place] [address]    Report Status PENDING  Incomplete  MRSA Next Gen by PCR, Nasal     Status: None   Collection Time: 02/26/21  7:43 PM   Specimen: Nasal Mucosa; Nasal Swab  Result Value Ref Range Status   MRSA by PCR Next Gen NOT DETECTED NOT DETECTED Final    Comment: (NOTE) The GeneXpert MRSA Assay (FDA approved for NASAL specimens only), is one component of a comprehensive MRSA colonization surveillance program. It is not intended to diagnose MRSA infection nor to guide or monitor treatment for MRSA infections. Test performance is not FDA approved in patients less than 19 years old. Performed at [facility], [address] [place],  [address]        Radiology Studies: No results found.    Scheduled Meds:  apixaban  5 mg Oral BID   bisoprolol  2.5 mg Oral BID   Chlorhexidine Gluconate Cloth  6 each Topical Q0600   dextromethorphan-guaiFENesin  1 tablet Oral BID   diltiazem  150 mg Oral Q12H   famotidine  20 mg Oral BID   insulin aspart  0-5 Units Subcutaneous QHS   insulin aspart  0-9 Units Subcutaneous TID WC   levothyroxine  200 mcg Oral Daily   LORazepam  1 mg Oral BID   melatonin  5 mg Oral  QHS   PARoxetine  20 mg Oral QPM   predniSONE  50 mg Oral Q breakfast   umeclidinium-vilanterol  1 puff Inhalation Daily   Continuous Infusions:  azithromycin 500 mg (03/01/21 1457)     LOS: 4 days    Dessa Phi, DO [facility] 03/02/2021, 10:30 AM   Available via Epic secure chat 7am-7pm After these hours, please refer to coverage provider listed on [email or web address]

## 2021-03-02 NOTE — TOC Initial Note (Signed)
Transition of Care [facility]) - Initial/Assessment Note    Patient Details  Name: Summer Bradley MRN: [id number] Date of Birth: May 17, 1945  [facility] CM/SW Contact:    Alberteen Sam, LCSW Phone Number: 03/02/2021, 2:03 PM  Clinical Narrative:                  CSW spoke with patient's POA [name] who reports being in agreement with PT recs of SNF at discharge with [facility] as preference.   CSW has attempted to inform patient's PACE worker Guy Franco at [phone] no answer lvm.   CSW has sent referral to [facility] and reached out to [facility] to identify if they are able to accept, pending bed offer at this time.    Expected Discharge Plan: [place] Barriers to Discharge: Continued Medical Work up   Patient Goals and CMS Choice Patient states their goals for this hospitalization and ongoing recovery are:: to og home CMS Medicare.gov Compare Post Acute Care list provided to:: Patient Represenative (must comment) (daughter Janace Hoard) Choice offered to / list presented to : Adult Children, HC POA / Guardian  Expected Discharge Plan and Services Expected Discharge Plan: [place] Acute Care Choice: [place] Living arrangements for the past 2 months: Single Family Home                                      Prior Living Arrangements/Services Living arrangements for the past 2 months: Single Family Home                     Activities of Daily Living Home Assistive Devices/Equipment: Wheelchair (pt walks short distances at home to go to bathroom but gets very SOB and patient primarily uses a wheelchair for mobility) ADL Screening (condition at time of admission) Patient's cognitive ability adequate to safely complete daily activities?: Yes Is the patient deaf or have difficulty hearing?: No Does the patient have difficulty seeing, even when wearing glasses/contacts?: No Does the patient have difficulty  concentrating, remembering, or making decisions?: No Patient able to express need for assistance with ADLs?: Yes Does the patient have difficulty dressing or bathing?: Yes Independently performs ADLs?: No Communication: Independent Dressing (OT): Needs assistance Is this a change from baseline?: Pre-admission baseline Grooming: Needs assistance Is this a change from baseline?: Pre-admission baseline Feeding: Independent Bathing: Needs assistance Is this a change from baseline?: Pre-admission baseline Toileting: Needs assistance Is this a change from baseline?: Pre-admission baseline In/Out Bed: Needs assistance Is this a change from baseline?: Pre-admission baseline Walks in Home: Independent (pt walks short distances at home to go to bathroom but gets very SOB and patient primarily uses a wheelchair for mobility) Does the patient have difficulty walking or climbing stairs?: Yes Weakness of Legs: Both Weakness of Arms/Hands: None  Permission Sought/Granted                  Emotional Assessment         Alcohol / Substance Use: Not Applicable Psych Involvement: No (comment)  Admission diagnosis:  Atrial fibrillation with rapid ventricular response (HCC) [I48.91] Acute and chronic respiratory failure with hypoxia ([place]) [J96.21] Acute on chronic respiratory failure with hypoxia ([place]) [J96.21] Patient Active Problem List   Diagnosis Date Noted   Acute and chronic respiratory failure with hypoxia ([place]) 02/26/2021   Pneumonia due to gram-negative bacteria (  [place]) 02/26/2021   Edema    Pneumonia of both lower lobes due to infectious organism    Acute hypoxemic respiratory failure ([place]) 11/16/2020   Hypokalemia    Lactic acidosis    Weakness    Chronic a-fib (HCC)    Diabetes mellitus without complication ([place]) 22/97/9892   GERD (gastroesophageal reflux disease) 09/19/2019   Fall 09/19/2019   COPD exacerbation ([place]) 08/14/2019   Acute on chronic respiratory failure with  hypoxia ([place]) 08/13/2019   Hypothyroidism 08/13/2019   History of CVA (cerebrovascular accident) 08/13/2019   Palliative care by specialist    Goals of care, counseling/discussion    Acute delirium    Atrial fibrillation with rapid ventricular response (HCC)    Anxiety    Chronic heart failure with preserved ejection fraction (HFpEF) ([place])    Pulmonary hypertension, unspecified ([place])    Acute on chronic respiratory failure with hypoxemia ([place]) 02/03/2018   Pulmonary emphysema ([place]) 04/25/2015   Emphysema of lung ([place]) 05/09/2014   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   Hyperlipidemia 05/09/2014   GI bleed    Benign neoplasm of colon 03/03/2014   Diverticulosis of colon without hemorrhage 03/03/2014   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   Jejunal ulcer 03/03/2014   Gastric polyps 03/03/2014   Acute posthemorrhagic anemia 03/02/2014   Melena 02/27/2014   LGI bleed    Acute on chronic respiratory failure ([place]) 02/16/2014   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   Aspiration pneumonitis ([place])    Spontaneous pneumothorax    Thrush    COPD with acute exacerbation (HCC)    SOB (shortness of breath)    HLD (hyperlipidemia)    Essential hypertension    Dysphagia, pharyngoesophageal phase    Acute tracheobronchitis 02/04/2014   Atelectasis of left lung 02/04/2014   Wheezing    Acute respiratory failure (HCC)    Paroxysmal A-fib (HCC)    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)    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[place])    Stroke ([place]) 01/29/2014   Respiratory failure ([place]) 01/29/2014   PCP:  [place]:   [place] [facility] [place], [place] [address] Phone: [phone] Fax: [phone]  [place] [address] [place]  [place] [address] Phone: [phone] Fax: [place], [place] [address] Phone: [phone] Fax: [phone]     Social Determinants of Health (SDOH) Interventions    Readmission Risk Interventions Readmission Risk Prevention Plan 11/18/2020 09/21/2019 09/20/2019  Transportation Screening Complete Complete Complete  PCP or Specialist Appt within 3-5 Days Complete Complete Complete  HRI or Home Care Consult Complete Complete Complete  Social Work Consult for [place] Planning/Counseling Complete - -  Palliative Care Screening Not Applicable Not Applicable Not Applicable  Medication Review (RN Care Manager) Complete Complete Complete  Some recent data might be hidden

## 2021-03-02 NOTE — NC FL2 (Signed)
[place] LEVEL OF CARE SCREENING TOOL     IDENTIFICATION  Patient Name: Summer Bradley Birthdate: 04-16-1945 Sex: female Admission Date (Current Location): 02/26/2021  [facility] [place] Number:  Engineering geologist and Address:  [facility], [place], [address], [place], [place] [address]      Provider Number: [id number]  Attending Physician Name and Address:  Dessa Phi, DO  Relative Name and Phone Number:  Kirtland Bouchard) [phone]    Current Level of Care: Hospital Recommended Level of Care: [place] Prior Approval Number:    Date Approved/Denied:   PASRR Number: [id number]  Discharge Plan: SNF    Current Diagnoses: Patient Active Problem List   Diagnosis Date Noted   Acute and chronic respiratory failure with hypoxia ([place]) 02/26/2021   Pneumonia due to gram-negative bacteria ([place]) 02/26/2021   Edema    Pneumonia of both lower lobes due to infectious organism    Acute hypoxemic respiratory failure ([place]) 11/16/2020   Hypokalemia    Lactic acidosis    Weakness    Chronic a-fib ([place])    Diabetes mellitus without complication ([place]) 83/29/1916   GERD (gastroesophageal reflux disease) 09/19/2019   Fall 09/19/2019   COPD exacerbation ([place]) 08/14/2019   Acute on chronic respiratory failure with hypoxia ([place]) 08/13/2019   Hypothyroidism 08/13/2019   History of CVA (cerebrovascular accident) 08/13/2019   Palliative care by specialist    Goals of care, counseling/discussion    Acute delirium    Atrial fibrillation with rapid ventricular response ([place])    Anxiety    Chronic heart failure with preserved ejection fraction (HFpEF) ([place])    Pulmonary hypertension, unspecified ([place])    Acute on chronic respiratory failure with hypoxemia ([place]) 02/03/2018   Pulmonary emphysema ([place]) 04/25/2015   Emphysema of lung ([place]) 05/09/2014   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   Cerebral  infarction due to embolism of right middle cerebral artery ([place]) 05/09/2014   Hyperlipidemia 05/09/2014   GI bleed    Benign neoplasm of colon 03/03/2014   Diverticulosis of colon without hemorrhage 03/03/2014   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   Jejunal ulcer 03/03/2014   Gastric polyps 03/03/2014   Acute posthemorrhagic anemia 03/02/2014   Melena 02/27/2014   LGI bleed    Acute on chronic respiratory failure ([place]) 02/16/2014   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   Aspiration pneumonitis ([place])    Spontaneous pneumothorax    Thrush    COPD with acute exacerbation (HCC)    SOB (shortness of breath)    HLD (hyperlipidemia)    Essential hypertension    Dysphagia, pharyngoesophageal phase    Acute tracheobronchitis 02/04/2014   Atelectasis of left lung 02/04/2014   Wheezing    Acute respiratory failure (HCC)    Paroxysmal A-fib (HCC)    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)    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HCC)    Stroke ([place]) 01/29/2014   Respiratory failure ([place]) 01/29/2014    Orientation RESPIRATION BLADDER Height & Weight     Self, Time, Place  O2 (3L nasal cannula) Incontinent, External catheter Weight: 187 lb 13.3 oz (85.2 kg) Height:  5\' 5"  (165.1 cm)  BEHAVIORAL SYMPTOMS/MOOD NEUROLOGICAL BOWEL NUTRITION STATUS      Continent Diet (see discharge summary)  AMBULATORY STATUS COMMUNICATION OF NEEDS Skin   Extensive Assist Verbally Other (Comment) (weeping both arms, abrasion arm and leg)  Personal Care Assistance Level of Assistance  Bathing, Feeding, Dressing, Total care Bathing Assistance: Maximum assistance Feeding assistance: Limited assistance Dressing Assistance: Maximum assistance Total Care Assistance: Maximum assistance   Functional Limitations Info  Sight, Hearing, Speech Sight Info: Adequate Hearing Info: Adequate Speech Info: Adequate    SPECIAL  CARE FACTORS FREQUENCY  PT (By licensed PT), OT (By licensed OT)     PT Frequency: min 4x weekly OT Frequency: min 4x weekly            Contractures Contractures Info: Not present    Additional Factors Info  Code Status, Allergies Code Status Info: DNR Allergies Info: lisinopril, amoxicillin           Current Medications (03/02/2021):  This is the current hospital active medication list Current Facility-Administered Medications  Medication Dose Route Frequency Provider Last Rate Last Admin   acetaminophen (TYLENOL) tablet 650 mg  650 mg Oral Q6H PRN Tacey Ruiz, MD       Or   acetaminophen (TYLENOL) suppository 650 mg  650 mg Rectal Q6H PRN Tacey Ruiz, MD       albuterol (PROVENTIL) (2.5 MG/3ML) 0.083% nebulizer solution 2.5 mg  2.5 mg Nebulization Q4H PRN Tacey Ruiz, MD       apixaban [place]) tablet 5 mg  5 mg Oral BID Tacey Ruiz, MD   5 mg at 03/02/21 0842   azithromycin (ZITHROMAX) 500 mg in sodium chloride 0.9 % 250 mL IVPB  500 mg Intravenous Q24H Tacey Ruiz, MD 250 mL/hr at 03/01/21 1457 500 mg at 03/01/21 1457   bisacodyl (DULCOLAX) EC tablet 5 mg  5 mg Oral Daily PRN Tacey Ruiz, MD       bisoprolol (ZEBETA) tablet 2.5 mg  2.5 mg Oral BID Tacey Ruiz, MD   2.5 mg at 03/02/21 0844   Chlorhexidine Gluconate Cloth 2 % PADS 6 each  6 each Topical Q0600 Tacey Ruiz, MD   6 each at 02/28/21 0922   dextromethorphan-guaiFENesin ([place] DM) 30-600 MG per 12 hr tablet 1 tablet  1 tablet Oral BID Dessa Phi, DO   1 tablet at 03/02/21 0842   diltiazem (CARDIZEM) tablet 150 mg  150 mg Oral Q12H Dessa Phi, DO       famotidine (PEPCID) tablet 20 mg  20 mg Oral BID Tacey Ruiz, MD   20 mg at 03/02/21 1245   HYDROcodone-acetaminophen (NORCO/VICODIN) 5-325 MG per tablet 1-2 tablet  1-2 tablet Oral Q4H PRN Tacey Ruiz, MD       insulin aspart (novoLOG) injection 0-5 Units  0-5 Units Subcutaneous QHS Dessa Phi, DO   3 Units at  03/01/21 2326   insulin aspart (novoLOG) injection 0-9 Units  0-9 Units Subcutaneous TID WC Dessa Phi, DO   2 Units at 03/02/21 8099   levothyroxine (SYNTHROID) tablet 200 mcg  200 mcg Oral Daily Tacey Ruiz, MD   200 mcg at 03/02/21 0641   LORazepam (ATIVAN) injection 1 mg  1 mg Intravenous Q4H PRN Dessa Phi, DO   1 mg at 03/02/21 0247   [START ON 03/03/2021] LORazepam (ATIVAN) tablet 0.5 mg  0.5 mg Oral q AM Dessa Phi, DO       LORazepam (ATIVAN) tablet 1 mg  1 mg Oral QHS Dessa Phi, DO       melatonin tablet 5 mg  5 mg Oral QHS Tacey Ruiz, MD   5 mg at 03/01/21 2312   metoprolol tartrate (LOPRESSOR) injection 5 mg  5 mg Intravenous Q6H PRN Tacey Ruiz,  MD       morphine 2 MG/ML injection 2 mg  2 mg Intravenous Q2H PRN Tacey Ruiz, MD       ondansetron [facility]) tablet 4 mg  4 mg Oral Q6H PRN Tacey Ruiz, MD       Or   ondansetron [facility]) injection 4 mg  4 mg Intravenous Q6H PRN Tacey Ruiz, MD   4 mg at 02/28/21 0650   PARoxetine (PAXIL) tablet 20 mg  20 mg Oral QPM Tacey Ruiz, MD   20 mg at 03/01/21 1650   predniSONE (DELTASONE) tablet 50 mg  50 mg Oral Q breakfast Dessa Phi, DO   50 mg at 03/02/21 0843   senna-docusate (Senokot-S) tablet 1 tablet  1 tablet Oral QHS PRN Tacey Ruiz, MD       umeclidinium-vilanterol (ANORO ELLIPTA) 62.5-25 MCG/ACT 1 puff  1 puff Inhalation Daily Tacey Ruiz, MD   1 puff at 03/02/21 0844     Discharge Medications: Please see discharge summary for a list of discharge medications.  Relevant Imaging Results:  Relevant Lab Results:   Additional Information [id number]  Alberteen Sam, LCSW

## 2021-03-03 LAB — CULTURE, BLOOD (ROUTINE X 2)
Culture: NO GROWTH
Culture: NO GROWTH

## 2021-03-03 LAB — BASIC METABOLIC PANEL
Anion gap: 12 (ref 5–15)
BUN: 40 mg/dL — ABNORMAL HIGH (ref 8–23)
CO2: 29 mmol/L (ref 22–32)
Calcium: 9.2 mg/dL (ref 8.9–10.3)
Chloride: 104 mmol/L (ref 98–111)
Creatinine, Ser: 0.96 mg/dL (ref 0.44–1.00)
GFR, Estimated: >60 mL/min (ref >60)
Glucose, Bld: 135 mg/dL — ABNORMAL HIGH (ref 70–99)
Potassium: 3.7 mmol/L (ref 3.5–5.1)
Sodium: 145 mmol/L (ref 135–145)

## 2021-03-03 LAB — CBC
HCT: 37.6 % (ref 36.0–46.0)
Hemoglobin: 11.1 g/dL — ABNORMAL LOW (ref 12.0–15.0)
MCH: 23.3 pg — ABNORMAL LOW (ref 26.0–34.0)
MCHC: 29.5 g/dL — ABNORMAL LOW (ref 30.0–36.0)
MCV: 78.8 fL — ABNORMAL LOW (ref 80.0–100.0)
Platelets: 508 10*3/uL — ABNORMAL HIGH (ref 150–400)
RBC: 4.77 MIL/uL (ref 3.87–5.11)
RDW: 19.1 % — ABNORMAL HIGH (ref 11.5–15.5)
WBC: 15.5 10*3/uL — ABNORMAL HIGH (ref 4.0–10.5)
nRBC: 4.7 % — ABNORMAL HIGH (ref 0.0–0.2)

## 2021-03-03 LAB — GLUCOSE, CAPILLARY
Glucose-Capillary: 204 mg/dL — ABNORMAL HIGH (ref 70–99)
Glucose-Capillary: 290 mg/dL — ABNORMAL HIGH (ref 70–99)
Glucose-Capillary: 222 mg/dL — ABNORMAL HIGH (ref 70–99)

## 2021-03-03 MED ORDER — INSULIN ASPART 100 UNIT/ML IJ SOLN
3.0000 [IU] | Freq: Three times a day (TID) | INTRAMUSCULAR | Status: DC
  Administered 2021-03-03 – 2021-03-05 (×6): 3 [IU] via SUBCUTANEOUS
  Filled 2021-03-03 (×6): qty 1

## 2021-03-03 MED ORDER — BOOST / RESOURCE BREEZE PO LIQD CUSTOM
1.0000 | Freq: Three times a day (TID) | ORAL | Status: DC
  Administered 2021-03-03: 237 mL via ORAL
  Administered 2021-03-04 (×2): 1 via ORAL
  Administered 2021-03-04: 237 mL via ORAL
  Administered 2021-03-05 (×2): 1 via ORAL

## 2021-03-03 NOTE — Progress Notes (Signed)
Has no PROGRESS NOTE    Summer Bradley  DXA:[id number] DOB: 10/03/1945 DOA: 02/26/2021 PCP: [place]     Brief Narrative:  Summer Bradley is a 76 y.o. female with medical history significant for atrial fibrillation, diastolic CHF, COPD, chronic respiratory failure 3L  at home, diabetes, history of R MCA stroke, hypertension, pulmonary hypertension, recent admission 01/18/21 - 01/21/21 for COPD exacerbation, who presents to the emergency department on 02/27/20 with shortness of breath. Patient has chronic shortness of breath but it began to worsen several days before admission, and duration is now constant. Chest x-ray showed cardiomegaly and bibasilar opacities. Head CT showed chronic Right MCA infarct, left frontal and parietal scalp hematomas, and pan-sinus disease. Patient was found to be in A fib with RVR. She was started on an IV diltiazem drip. She was placed on BiPAP. Blood cultures were drawn. Patient was given Duo-nebs, IV methylprednisolone, IV cefepime, and IV azithromycin.   [facility] # [phone]  New events last 24 hours / Subjective: More alert today, getting repositioned in bed. Per daughter, patient was able to eat a little more this morning.   Assessment & Plan:   Principal Problem:   Acute and chronic respiratory failure with hypoxia (HCC) Active Problems:   Essential hypertension   COPD with acute exacerbation (HCC)   Chronic heart failure with preserved ejection fraction (HFpEF) (HCC)   Atrial fibrillation with rapid ventricular response (HCC)   Hypothyroidism   GERD (gastroesophageal reflux disease)   Lactic acidosis   Pneumonia due to gram-negative bacteria (HCC)   Acute on chronic hypoxemic respiratory failure -Requires 3 L nasal cannula O2 at baseline -Required BiPAP on admission, now weaned down to 3 L oxygen   COPD exacerbation -Continue breathing tx, prednisone  -Completed azithromycin   A. fib RVR, persistent -Now weaned off  Cardizem drip -Continue p.o. Cardizem, Eliquis, bisoprolol  -HR well controlled  CKD stage IIIa -Baseline creatinine 0.8-0.9 -Stable   Fall at home -CT head without acute intracranial process -PT eval rec SNF placement   History of CVA -Continue eliquis   Hypothyroidism -Continue Synthroid  Anxiety -Continue Ativan, dose decreased  Leukocytosis -In setting of steroid use   Hyperglycemia -Novolog and SSI     DVT prophylaxis:  SCDs Start: 02/26/21 1735 Place and maintain sequential compression device Start: 02/26/21 1459 apixaban (ELIQUIS) tablet 5 mg  Code Status: DNR  Family Communication: Daughter over the phone  Disposition Plan:  Status is: Inpatient  Remains inpatient appropriate because: Increase nutrition today and work on getting more alert. Hopeful dc to SNF tomorrow if better.    Consultants:  None  Procedures:  None   Antimicrobials:  Anti-infectives (From admission, onward)    Start     Dose/Rate Route Frequency Ordered Stop   02/27/21 1400  azithromycin (ZITHROMAX) 500 mg in sodium chloride 0.9 % 250 mL IVPB        500 mg 250 mL/hr over 60 Minutes Intravenous Every 24 hours 02/26/21 1745 03/03/21 0724   02/26/21 2200  ceFEPIme (MAXIPIME) 2 g in sodium chloride 0.9 % 100 mL IVPB  Status:  Discontinued        2 g 200 mL/hr over 30 Minutes Intravenous Every 12 hours 02/26/21 1757 02/27/21 1414   02/26/21 2000  ceFEPIme (MAXIPIME) 2 g in sodium chloride 0.9 % 100 mL IVPB  Status:  Discontinued        2 g 200 mL/hr over 30 Minutes Intravenous Every 8 hours 02/26/21 1745  02/26/21 1756   02/26/21 1230  ceFEPIme (MAXIPIME) 2 g in sodium chloride 0.9 % 100 mL IVPB        2 g 200 mL/hr over 30 Minutes Intravenous  Once 02/26/21 1225 02/26/21 1508   02/26/21 1230  azithromycin (ZITHROMAX) 500 mg in sodium chloride 0.9 % 250 mL IVPB        500 mg 250 mL/hr over 60 Minutes Intravenous  Once 02/26/21 1225 02/26/21 1813        Objective: Vitals:    03/02/21 2006 03/03/21 0010 03/03/21 0537 03/03/21 1151  BP: (!) 136/92 118/72 (!) 145/97 119/80  Pulse: (!) 103 (!) 59 79 65  Resp: 18 18 18 18   Temp: 98.1 F (36.7 C) (!) 97.4 F (36.3 C) 98 F (36.7 C) 97.7 F (36.5 C)  TempSrc:      SpO2: 93% 90% 92% 95%  Weight:      Height:        Intake/Output Summary (Last 24 hours) at 03/03/2021 1206 Last data filed at 03/03/2021 0112 Gross per 24 hour  Intake --  Output 600 ml  Net -600 ml    Filed Weights   02/28/21 0227 03/01/21 0500 03/02/21 0500  Weight: 84.2 kg 85 kg 85.2 kg    Examination:  General exam: Appears calm Respiratory system: Diminished breath sounds bilaterally but clear  Cardiovascular system: S1 & S2 heard, irregular rhythm with rate 90s. No murmurs. No pedal edema. Gastrointestinal system: Abdomen is nondistended, soft and nontender. Normal bowel sounds heard. Central nervous system: Alert Extremities: Symmetric in appearance  Skin: No rashes, lesions or ulcers on exposed skin    Data Reviewed: I have personally reviewed following labs and imaging studies  CBC: Recent Labs  Lab 02/26/21 1150 02/27/21 0251 02/28/21 0420 03/01/21 0448 03/03/21 0523  WBC 18.5* 13.7* 19.2* 17.4* 15.5*  HGB 12.5 10.8* 9.9* 10.2* 11.1*  HCT 42.5 36.1 33.6* 35.3* 37.6  MCV 79.6* 78.1* 78.7* 79.0* 78.8*  PLT 532* 477* 435* 493* 508*    Basic Metabolic Panel: Recent Labs  Lab 02/26/21 1150 02/27/21 0251 02/27/21 1211 02/28/21 0420 03/01/21 0448 03/03/21 0523  NA 140 136  --  138 140 145  K 4.9 5.5* 4.9 4.2 4.2 3.7  CL 105 105  --  105 102 104  CO2 25 24  --  26 28 29   GLUCOSE 153* 227*  --  189* 172* 135*  BUN 18 23  --  42* 51* 40*  CREATININE 1.03* 1.17*  --  1.24* 1.08* 0.96  CALCIUM 10.0 9.1  --  9.2 9.4 9.2    GFR: Estimated Creatinine Clearance: 53.8 mL/min (by C-G formula based on SCr of 0.96 mg/dL). Liver Function Tests: Recent Labs  Lab 02/26/21 1150  AST 36  ALT 26  ALKPHOS 69  BILITOT  1.1  PROT 7.8  ALBUMIN 3.7    No results for input(s): LIPASE, AMYLASE in the last 168 hours. No results for input(s): AMMONIA in the last 168 hours. Coagulation Profile: Recent Labs  Lab 02/26/21 1714  INR 1.7*    Cardiac Enzymes: No results for input(s): CKTOTAL, CKMB, CKMBINDEX, TROPONINI in the last 168 hours. BNP (last 3 results) No results for input(s): PROBNP in the last 8760 hours. HbA1C: No results for input(s): HGBA1C in the last 72 hours. CBG: Recent Labs  Lab 03/01/21 1611 03/01/21 2120 03/02/21 0801 03/02/21 1732 03/02/21 2011  GLUCAP 251* 270* 182* 221* 353*    Lipid Profile: No results for input(s):  CHOL, HDL, LDLCALC, TRIG, CHOLHDL, LDLDIRECT in the last 72 hours. Thyroid Function Tests: No results for input(s): TSH, T4TOTAL, FREET4, T3FREE, THYROIDAB in the last 72 hours. Anemia Panel: No results for input(s): VITAMINB12, FOLATE, FERRITIN, TIBC, IRON, RETICCTPCT in the last 72 hours. Sepsis Labs: Recent Labs  Lab 02/26/21 1628 02/26/21 1714 02/26/21 1952 02/27/21 0017 02/27/21 0251  PROCALCITON  --  0.14  --   --   --   LATICACIDVEN 2.6*  --  2.9* 2.3* 2.7*     Recent Results (from the past 240 hour(s))  Resp Panel by RT-PCR (Flu A&B, Covid) Nasopharyngeal Swab     Status: None   Collection Time: 02/26/21 11:50 AM   Specimen: Nasopharyngeal Swab; Nasopharyngeal(NP) swabs in vial transport medium  Result Value Ref Range Status   SARS Coronavirus 2 by RT PCR NEGATIVE NEGATIVE Final    Comment: (NOTE) SARS-CoV-2 target nucleic acids are NOT DETECTED.  The SARS-CoV-2 RNA is generally detectable in upper respiratory specimens during the acute phase of infection. The lowest concentration of SARS-CoV-2 viral copies this assay can detect is 138 copies/mL. A negative result does not preclude SARS-Cov-2 infection and should not be used as the sole basis for treatment or other patient management decisions. A negative result may occur with   improper specimen collection/handling, submission of specimen other than nasopharyngeal swab, presence of viral mutation(s) within the areas targeted by this assay, and inadequate number of viral copies(<138 copies/mL). A negative result must be combined with clinical observations, patient history, and epidemiological information. The expected result is Negative.  Fact Sheet for Patients:  EntrepreneurPulse.com.au  Fact Sheet for Healthcare Providers:  IncredibleEmployment.be  This test is no t yet approved or cleared by the [place] FDA and  has been authorized for detection and/or diagnosis of SARS-CoV-2 by FDA under an Emergency Use Authorization (EUA). This EUA will remain  in effect (meaning this test can be used) for the duration of the COVID-19 declaration under Section 564(b)(1) of the Act, 21 U.S.C.section 360bbb-3(b)(1), unless the authorization is terminated  or revoked sooner.       Influenza A by PCR NEGATIVE NEGATIVE Final   Influenza B by PCR NEGATIVE NEGATIVE Final    Comment: (NOTE) The Xpert Xpress SARS-CoV-2/FLU/RSV plus assay is intended as an aid in the diagnosis of influenza from Nasopharyngeal swab specimens and should not be used as a sole basis for treatment. Nasal washings and aspirates are unacceptable for Xpert Xpress SARS-CoV-2/FLU/RSV testing.  Fact Sheet for Patients: EntrepreneurPulse.com.au  Fact Sheet for Healthcare Providers: IncredibleEmployment.be  This test is not yet approved or cleared by the [place] FDA and has been authorized for detection and/or diagnosis of SARS-CoV-2 by FDA under an Emergency Use Authorization (EUA). This EUA will remain in effect (meaning this test can be used) for the duration of the COVID-19 declaration under Section 564(b)(1) of the Act, 21 U.S.C. section 360bbb-3(b)(1), unless the authorization is terminated  or revoked.  Performed at [facility], [address] [place], [place] [address]   Blood Culture (routine x 2)     Status: None   Collection Time: 02/26/21 11:50 AM   Specimen: BLOOD  Result Value Ref Range Status   Specimen Description BLOOD RIGHT ANTECUBITAL  Final   Special Requests BOTTLES DRAWN AEROBIC AND ANAEROBIC BCAV  Final   Culture   Final    NO GROWTH 5 DAYS Performed at [facility], [address] [place], [place] [address]    Report  Status 03/03/2021 FINAL  Final  Blood Culture (routine x 2)     Status: None   Collection Time: 02/26/21 12:06 PM   Specimen: BLOOD  Result Value Ref Range Status   Specimen Description BLOOD LEFT ANTECUBITAL  Final   Special Requests BOTTLES DRAWN AEROBIC AND ANAEROBIC BCAV  Final   Culture   Final    NO GROWTH 5 DAYS Performed at [facility], [address], [place], [place] [address]    Report Status 03/03/2021 FINAL  Final  MRSA Next Gen by PCR, Nasal     Status: None   Collection Time: 02/26/21  7:43 PM   Specimen: Nasal Mucosa; Nasal Swab  Result Value Ref Range Status   MRSA by PCR Next Gen NOT DETECTED NOT DETECTED Final    Comment: (NOTE) The GeneXpert MRSA Assay (FDA approved for NASAL specimens only), is one component of a comprehensive MRSA colonization surveillance program. It is not intended to diagnose MRSA infection nor to guide or monitor treatment for MRSA infections. Test performance is not FDA approved in patients less than 44 years old. Performed at [facility], [address] [place], [place] [address]        Radiology Studies: No results found.    Scheduled Meds:  apixaban  5 mg Oral BID   bisoprolol  2.5 mg Oral BID   Chlorhexidine Gluconate Cloth  6 each Topical Q0600   dextromethorphan-guaiFENesin  1 tablet Oral BID   diltiazem  150 mg Oral Q12H   famotidine  20 mg Oral BID   insulin aspart  0-5 Units Subcutaneous QHS   insulin aspart  0-9  Units Subcutaneous TID WC   insulin aspart  3 Units Subcutaneous TID WC   levothyroxine  200 mcg Oral Daily   LORazepam  0.5 mg Oral q AM   LORazepam  1 mg Oral QHS   melatonin  5 mg Oral QHS   predniSONE  50 mg Oral Q breakfast   umeclidinium-vilanterol  1 puff Inhalation Daily   Continuous Infusions:     LOS: 5 days    Dessa Phi, DO [facility] 03/03/2021, 12:06 PM   Available via Epic secure chat 7am-7pm After these hours, please refer to coverage provider listed on [email or web address]

## 2021-03-03 NOTE — Progress Notes (Signed)
Inpatient Diabetes Program Recommendations  AACE/ADA: New Consensus Statement on Inpatient Glycemic Control (2015)  Target Ranges:  Prepandial:   less than 140 mg/dL      Peak postprandial:   less than 180 mg/dL (1-2 hours)      Critically ill patients:  140 - 180 mg/dL   Lab Results  Component Value Date   GLUCAP 353 (H) 03/02/2021   HGBA1C 6.0 (H) 11/17/2020    Review of Glycemic Control  Latest Reference Range & Units 03/02/21 08:01 03/02/21 17:32 03/02/21 20:11  Glucose-Capillary 70 - 99 mg/dL 182 (H) 221 (H) 353 (H)   Diabetes history: DM 2 Outpatient Diabetes medications: Metformin 500 mg bid Current orders for Inpatient glycemic control:  Novolog 0-9 units tid + hs PO prednisone 50 mg Daily  Inpatient Diabetes Program Recommendations:    Glucose trends increase after PO intake and steroid dose  -  Consider Novolog 3 units tid if eating >50% of meals  Thanks,  Tama Headings RN, MSN, BC-ADM Inpatient Diabetes Coordinator Team Pager [phone] (8a-5p)

## 2021-03-03 NOTE — Progress Notes (Signed)
Occupational Therapy Treatment Patient Details Name: Summer Bradley MRN: [id number] DOB: 1946-01-06 Today's Date: 03/03/2021   History of present illness 76 y.o. female with medical history significant for atrial fibrillation, diastolic CHF, COPD, chronic respiratory failure 3L [place] at home, diabetes, history of R MCA stroke, hypertension, pulmonary hypertension, recent admission 01/18/21 - 01/21/21 for COPD exacerbation, who presents to the emergency department on 02/27/20 with shortness of breath. Patient has chronic shortness of breath but it began to worsen several days before admission, and duration is now constant. Chest x-ray showed cardiomegaly and bibasilar opacities. Head CT showed chronic Right MCA infarct, left frontal and parietal scalp hematomas, and pan-sinus disease. Patient was found to be in A fib with RVR. She was started on an IV diltiazem drip. She was placed on BiPAP.   OT comments  Upon entering the room, pt supine in bed with daughter present in the room. Pt continues to be confused and crying out but unable to verbalize why. Daughter reports she is doing this with her in the room as well. Total A to don B socks while pt maintained seated balance with min guard. Supine >sit with max A to EOB. Pt standing with mod HHA and taking 1 side step. Pt standing 3 times during session for 30 seconds - 1 minute each time. Pt returning to bed and therapist applied lotion to back and lower legs and pt calming in bed. Daughter remains in room. All needs within reach. Pt continues to benefit from OT intervention.    Recommendations for follow up therapy are one component of a multi-disciplinary discharge planning process, led by the attending physician.  Recommendations may be updated based on patient status, additional functional criteria and insurance authorization.    Follow Up Recommendations  Skilled nursing-short term rehab (<3 hours/day)    Assistance Recommended at Discharge Frequent  or constant Supervision/Assistance  Patient can return home with the following  A lot of help with walking and/or transfers;A lot of help with bathing/dressing/bathroom;Assistance with cooking/housework;Direct supervision/assist for medications management;Direct supervision/assist for financial management;Assist for transportation;Help with stairs or ramp for entrance   Equipment Recommendations  BSC/3in1;Tub/shower seat;Other (comment) (RW)       Precautions / Restrictions Precautions Precautions: Fall       Mobility Bed Mobility Overal bed mobility: Needs Assistance Bed Mobility: Supine to Sit;Sit to Supine     Supine to sit: Max assist Sit to supine: Min guard   General bed mobility comments: Pt motivated to return to bed and needing less assistance for task    Transfers   Equipment used: 1 person hand held assist Transfers: Sit to/from Stand Sit to Stand: Mod assist           General transfer comment: multiple stands from EOB with mod lifting assistance     Balance Overall balance assessment: Needs assistance Sitting-balance support: Bilateral upper extremity supported Sitting balance-Leahy Scale: Fair     Standing balance support: Bilateral upper extremity supported Standing balance-Leahy Scale: Poor                             ADL either performed or assessed with clinical judgement   ADL                                         General ADL Comments: total A to don  B socks while seated on EOB. Pt unable to attend to tasks and is internally/externally distracted with max multimodal cuing for redirection. Max A to stand from EOB and takes 1 side step. Unable to attempt transfers secondary to safety concerns.    Extremity/Trunk Assessment Upper Extremity Assessment Upper Extremity Assessment: Generalized weakness   Lower Extremity Assessment Lower Extremity Assessment: Generalized weakness        Vision Patient Visual  Report: No change from baseline            Cognition Arousal/Alertness: Awake/alert Behavior During Therapy: Restless;Impulsive Overall Cognitive Status: Impaired/Different from baseline                                 General Comments: Pt's daughter present and reports that pt does have impairments at baseline but with greater confusion at this time. Pt is oriented to self only. she calls and cries out randomly and when asked what is wrong she reports, " I don't know".                     Pertinent Vitals/ Pain       Pain Assessment: Faces Faces Pain Scale: No hurt         Frequency  Min 2X/week        Progress Toward Goals  OT Goals(current goals can now be found in the care plan section)  Progress towards OT goals: Progressing toward goals  Acute Rehab OT Goals Patient Stated Goal: to get her stronger and better OT Goal Formulation: With family Time For Goal Achievement: 03/15/21 Potential to Achieve Goals: [place] Discharge plan remains appropriate;Frequency remains appropriate       AM-PAC OT "6 Clicks" Daily Activity     Outcome Measure   Help from another person eating meals?: A Little Help from another person taking care of personal grooming?: A Lot Help from another person toileting, which includes using toliet, bedpan, or urinal?: Total Help from another person bathing (including washing, rinsing, drying)?: A Lot Help from another person to put on and taking off regular upper body clothing?: A Lot Help from another person to put on and taking off regular lower body clothing?: Total 6 Click Score: 11    End of Session    OT Visit Diagnosis: Unsteadiness on feet (R26.81);Muscle weakness (generalized) (M62.81);History of falling (Z91.81)   Activity Tolerance Other (comment) (limited by cognition and restlessness)   Patient Left with call bell/phone within reach;with bed alarm set   Nurse Communication Mobility status         Time: 2330-0762 OT Time Calculation (min): 23 min  Charges: OT General Charges $OT Visit: 1 Visit OT Treatments $Self Care/Home Management : 8-22 mins $Therapeutic Activity: 8-22 mins  Darleen Crocker, MS, OTR/L , CBIS ascom [phone]  03/03/21, 2:11 PM

## 2021-03-03 NOTE — TOC Progression Note (Addendum)
Transition of Care [facility]) - Progression Note    Patient Details  Name: Summer Bradley MRN: [id number] Date of Birth: [birth year]  Transition of [facility] CM/SW [place],  Phone Number: 03/03/2021, 9:36 AM  Clinical Narrative:     Per Audry Pili at [facility] they are able to accept patient. MD reports potential dc for tomorrow, CSW has informed Ricky at [facility] patient will potentially be discharged to facility tomorrow.   Per Dr. Lavone Neri with [facility] call them tomorrow at [phone] so they can arrange discharge transportation.    Expected Discharge Plan: [place] Barriers to Discharge: Continued Medical Work up  Expected Discharge Plan and Services Expected Discharge Plan: [place] Choice: [place] arrangements for the past 2 months: Single Family Home                                       Social Determinants of Health (SDOH) Interventions    Readmission Risk Interventions Readmission Risk Prevention Plan 11/18/2020 09/21/2019 09/20/2019  Transportation Screening Complete Complete Complete  PCP or Specialist Appt within 3-5 Days Complete Complete Complete  HRI or Home Care Consult Complete Complete Complete  Social Work Consult for [place] Planning/Counseling Complete - -  Palliative Care Screening Not Applicable Not Applicable Not Applicable  Medication Review Press photographer) Complete Complete Complete  Some recent data might be hidden

## 2021-03-03 NOTE — Progress Notes (Signed)
PT Cancellation Note  Patient Details Name: Summer Bradley MRN: [id number] DOB: 1945/08/08   Cancelled Treatment:    Reason Eval/Treat Not Completed: Other (comment).  Chart reviewed.  Pt soundly sleeping upon PT arrival; therapist able to briefly wake pt before pt falling back asleep again (unable to keep pt awake/alert)--nurse notified and aware.  Will re-attempt PT treatment session at a later date/time.  Leitha Bleak, PT 03/03/21, 3:48 PM

## 2021-03-04 LAB — GLUCOSE, CAPILLARY
Glucose-Capillary: 178 mg/dL — ABNORMAL HIGH (ref 70–99)
Glucose-Capillary: 196 mg/dL — ABNORMAL HIGH (ref 70–99)
Glucose-Capillary: 191 mg/dL — ABNORMAL HIGH (ref 70–99)
Glucose-Capillary: 235 mg/dL — ABNORMAL HIGH (ref 70–99)

## 2021-03-04 MED ORDER — LORAZEPAM 2 MG/ML IJ SOLN
1.0000 mg | Freq: Once | INTRAMUSCULAR | Status: AC
  Administered 2021-03-04: 1 mg via INTRAVENOUS
  Filled 2021-03-04: qty 1

## 2021-03-04 NOTE — Progress Notes (Signed)
OT Cancellation Note  Patient Details Name: Melady Chow MRN: [id number] DOB: September 26, 1945   Cancelled Treatment:    Reason Eval/Treat Not Completed: Other (comment) (RN spoke to prior to tx session, reports pt just recieved ativan. Pt is soundly asleep upon entering room. Tx session deferred at this time. Will re-attempt as able.Shanon Payor, OTD OTR/L  03/04/21, 1:37 PM

## 2021-03-04 NOTE — Progress Notes (Signed)
Has no PROGRESS NOTE    Summer Bradley  YJE:[id number] DOB: [birth year] DOA: 02/26/2021 PCP: [facility]     Brief Narrative:  Summer Bradley is a 76 y.o. female with medical history significant for atrial fibrillation, diastolic CHF, COPD, chronic respiratory failure 3L [place] at home, diabetes, history of R MCA stroke, hypertension, pulmonary hypertension, recent admission 01/18/21 - 01/21/21 for COPD exacerbation, who presents to the emergency department on 02/27/20 with shortness of breath. Patient has chronic shortness of breath but it began to worsen several days before admission, and duration is now constant. Chest x-ray showed cardiomegaly and bibasilar opacities. Head CT showed chronic Right MCA infarct, left frontal and parietal scalp hematomas, and pan-sinus disease. Patient was found to be in A fib with RVR. She was started on an IV diltiazem drip. She was placed on BiPAP. Blood cultures were drawn. Patient was given Duo-nebs, IV methylprednisolone, IV cefepime, and IV azithromycin.  Hospitalization has been further complicated by lethargy, deconditioning.  She will need to go to SNF on discharge.  New events last 24 hours / Subjective: Patient is more alert today, yelling out "I need help" but unable to specify her needs.  Per daughter at bedside, this is her normal, often yells out for "help" at home at baseline.  We discussed patient's possible discharge to SNF.  Both daughter at bedside and daughter over the phone are concerned that patient remains extremely weak, has not been out of bed to the chair yet even.  They do not want her to discharge until she has been up to the chair, get more strength back.  They are concerned that when patient goes to SNF, she will refuse PT as she has done in the past.  Assessment & Plan:   Principal Problem:   Acute and chronic respiratory failure with hypoxia ([place]) Active Problems:   Essential hypertension   COPD with acute  exacerbation (HCC)   Chronic heart failure with preserved ejection fraction (HFpEF) (HCC)   Atrial fibrillation with rapid ventricular response (HCC)   Hypothyroidism   GERD (gastroesophageal reflux disease)   Lactic acidosis   Pneumonia due to gram-negative bacteria (HCC)   Acute on chronic hypoxemic respiratory failure -Requires 3 L nasal cannula O2 at baseline -Required BiPAP on admission, now weaned down to 3 L oxygen   COPD exacerbation -Continue breathing tx, prednisone  -Completed azithromycin   A. fib RVR, persistent -Now weaned off Cardizem drip -Continue p.o. Cardizem, Eliquis, bisoprolol  -HR well controlled  CKD stage IIIa -Baseline creatinine 0.8-0.9 -Stable   Fall at home -CT head without acute intracranial process -PT eval rec SNF placement   History of CVA -Continue eliquis   Hypothyroidism -Continue Synthroid  Anxiety -Continue Ativan, dose decreased  Leukocytosis -In setting of steroid use   Hyperglycemia -Novolog and SSI     DVT prophylaxis:  SCDs Start: 02/26/21 1735 Place and maintain sequential compression device Start: 02/26/21 1459 apixaban (ELIQUIS) tablet 5 mg  Code Status: DNR  Family Communication: Daughter at bedside, another daughter over the phone on speaker phone during my exam Disposition Plan:  Status is: Inpatient  Remains inpatient appropriate because: Continue PT today and mobilization.  Hopeful discharge to SNF 1/18   Consultants:  None  Procedures:  None   Antimicrobials:  Anti-infectives (From admission, onward)    Start     Dose/Rate Route Frequency Ordered Stop   02/27/21 1400  azithromycin (ZITHROMAX) 500 mg in sodium chloride 0.9 % 250  mL IVPB        500 mg 250 mL/hr over 60 Minutes Intravenous Every 24 hours 02/26/21 1745 03/03/21 0724   02/26/21 2200  ceFEPIme (MAXIPIME) 2 g in sodium chloride 0.9 % 100 mL IVPB  Status:  Discontinued        2 g 200 mL/hr over 30 Minutes Intravenous Every 12 hours  02/26/21 1757 02/27/21 1414   02/26/21 2000  ceFEPIme (MAXIPIME) 2 g in sodium chloride 0.9 % 100 mL IVPB  Status:  Discontinued        2 g 200 mL/hr over 30 Minutes Intravenous Every 8 hours 02/26/21 1745 02/26/21 1756   02/26/21 1230  ceFEPIme (MAXIPIME) 2 g in sodium chloride 0.9 % 100 mL IVPB        2 g 200 mL/hr over 30 Minutes Intravenous  Once 02/26/21 1225 02/26/21 1508   02/26/21 1230  azithromycin (ZITHROMAX) 500 mg in sodium chloride 0.9 % 250 mL IVPB        500 mg 250 mL/hr over 60 Minutes Intravenous  Once 02/26/21 1225 02/26/21 1813        Objective: Vitals:   03/04/21 0004 03/04/21 0456 03/04/21 0801 03/04/21 1207  BP: (!) 143/91 133/87 (!) 141/85 103/63  Pulse: 76 69 91 61  Resp: 18 18 18 18   Temp: 97.8 F (36.6 C) (!) 97.5 F (36.4 C) 98.4 F (36.9 C) 98 F (36.7 C)  TempSrc:  Axillary    SpO2: 94% 90% 96% 92%  Weight:      Height:        Intake/Output Summary (Last 24 hours) at 03/04/2021 1244 Last data filed at 03/04/2021 0600 Gross per 24 hour  Intake 0 ml  Output 1100 ml  Net -1100 ml    Filed Weights   02/28/21 0227 03/01/21 0500 03/02/21 0500  Weight: 84.2 kg 85 kg 85.2 kg    Examination:  General exam: Appears anxious Respiratory system: Diminished breath sounds bilaterally  Cardiovascular system: S1 & S2 heard, irregular rhythm with rate 90s. No murmurs. No pedal edema. Gastrointestinal system: Abdomen is nondistended, soft and nontender. Normal bowel sounds heard. Central nervous system: Alert Extremities: Symmetric in appearance  Skin: No rashes, lesions or ulcers on exposed skin    Data Reviewed: I have personally reviewed following labs and imaging studies  CBC: Recent Labs  Lab 02/26/21 1150 02/27/21 0251 02/28/21 0420 03/01/21 0448 03/03/21 0523  WBC 18.5* 13.7* 19.2* 17.4* 15.5*  HGB 12.5 10.8* 9.9* 10.2* 11.1*  HCT 42.5 36.1 33.6* 35.3* 37.6  MCV 79.6* 78.1* 78.7* 79.0* 78.8*  PLT 532* 477* 435* 493* 508*    Basic  Metabolic Panel: Recent Labs  Lab 02/26/21 1150 02/27/21 0251 02/27/21 1211 02/28/21 0420 03/01/21 0448 03/03/21 0523  NA 140 136  --  138 140 145  K 4.9 5.5* 4.9 4.2 4.2 3.7  CL 105 105  --  105 102 104  CO2 25 24  --  26 28 29   GLUCOSE 153* 227*  --  189* 172* 135*  BUN 18 23  --  42* 51* 40*  CREATININE 1.03* 1.17*  --  1.24* 1.08* 0.96  CALCIUM 10.0 9.1  --  9.2 9.4 9.2    GFR: Estimated Creatinine Clearance: 53.8 mL/min (by C-G formula based on SCr of 0.96 mg/dL). Liver Function Tests: Recent Labs  Lab 02/26/21 1150  AST 36  ALT 26  ALKPHOS 69  BILITOT 1.1  PROT 7.8  ALBUMIN 3.7    No  results for input(s): LIPASE, AMYLASE in the last 168 hours. No results for input(s): AMMONIA in the last 168 hours. Coagulation Profile: Recent Labs  Lab 02/26/21 1714  INR 1.7*    Cardiac Enzymes: No results for input(s): CKTOTAL, CKMB, CKMBINDEX, TROPONINI in the last 168 hours. BNP (last 3 results) No results for input(s): PROBNP in the last 8760 hours. HbA1C: No results for input(s): HGBA1C in the last 72 hours. CBG: Recent Labs  Lab 03/03/21 1225 03/03/21 1805 03/03/21 2018 03/04/21 0759 03/04/21 1207  GLUCAP 204* 290* 222* 178* 196*    Lipid Profile: No results for input(s): CHOL, HDL, LDLCALC, TRIG, CHOLHDL, LDLDIRECT in the last 72 hours. Thyroid Function Tests: No results for input(s): TSH, T4TOTAL, FREET4, T3FREE, THYROIDAB in the last 72 hours. Anemia Panel: No results for input(s): VITAMINB12, FOLATE, FERRITIN, TIBC, IRON, RETICCTPCT in the last 72 hours. Sepsis Labs: Recent Labs  Lab 02/26/21 1628 02/26/21 1714 02/26/21 1952 02/27/21 0017 02/27/21 0251  PROCALCITON  --  0.14  --   --   --   LATICACIDVEN 2.6*  --  2.9* 2.3* 2.7*     Recent Results (from the past 240 hour(s))  Resp Panel by RT-PCR (Flu A&B, Covid) Nasopharyngeal Swab     Status: None   Collection Time: 02/26/21 11:50 AM   Specimen: Nasopharyngeal Swab; Nasopharyngeal(NP)  swabs in vial transport medium  Result Value Ref Range Status   SARS Coronavirus 2 by RT PCR NEGATIVE NEGATIVE Final    Comment: (NOTE) SARS-CoV-2 target nucleic acids are NOT DETECTED.  The SARS-CoV-2 RNA is generally detectable in upper respiratory specimens during the acute phase of infection. The lowest concentration of SARS-CoV-2 viral copies this assay can detect is 138 copies/mL. A negative result does not preclude SARS-Cov-2 infection and should not be used as the sole basis for treatment or other patient management decisions. A negative result may occur with  improper specimen collection/handling, submission of specimen other than nasopharyngeal swab, presence of viral mutation(s) within the areas targeted by this assay, and inadequate number of viral copies(<138 copies/mL). A negative result must be combined with clinical observations, patient history, and epidemiological information. The expected result is Negative.  Fact Sheet for Patients:  EntrepreneurPulse.com.au  Fact Sheet for Healthcare Providers:  IncredibleEmployment.be  This test is no t yet approved or cleared by the [place] FDA and  has been authorized for detection and/or diagnosis of SARS-CoV-2 by FDA under an Emergency Use Authorization (EUA). This EUA will remain  in effect (meaning this test can be used) for the duration of the COVID-19 declaration under Section 564(b)(1) of the Act, 21 U.S.C.section 360bbb-3(b)(1), unless the authorization is terminated  or revoked sooner.       Influenza A by PCR NEGATIVE NEGATIVE Final   Influenza B by PCR NEGATIVE NEGATIVE Final    Comment: (NOTE) The Xpert Xpress SARS-CoV-2/FLU/RSV plus assay is intended as an aid in the diagnosis of influenza from Nasopharyngeal swab specimens and should not be used as a sole basis for treatment. Nasal washings and aspirates are unacceptable for Xpert Xpress  SARS-CoV-2/FLU/RSV testing.  Fact Sheet for Patients: EntrepreneurPulse.com.au  Fact Sheet for Healthcare Providers: IncredibleEmployment.be  This test is not yet approved or cleared by the [place] FDA and has been authorized for detection and/or diagnosis of SARS-CoV-2 by FDA under an Emergency Use Authorization (EUA). This EUA will remain in effect (meaning this test can be used) for the duration of the COVID-19 declaration under Section 564(b)(1) of the  Act, 21 U.S.C. section 360bbb-3(b)(1), unless the authorization is terminated or revoked.  Performed at [facility], [address] [place], [place] [address]   Blood Culture (routine x 2)     Status: None   Collection Time: 02/26/21 11:50 AM   Specimen: BLOOD  Result Value Ref Range Status   Specimen Description BLOOD RIGHT ANTECUBITAL  Final   Special Requests BOTTLES DRAWN AEROBIC AND ANAEROBIC BCAV  Final   Culture   Final    NO GROWTH 5 DAYS Performed at [facility], [address] [place], [place] [address]    Report Status 03/03/2021 FINAL  Final  Blood Culture (routine x 2)     Status: None   Collection Time: 02/26/21 12:06 PM   Specimen: BLOOD  Result Value Ref Range Status   Specimen Description BLOOD LEFT ANTECUBITAL  Final   Special Requests BOTTLES DRAWN AEROBIC AND ANAEROBIC BCAV  Final   Culture   Final    NO GROWTH 5 DAYS Performed at [facility], [address], [place], [place] [address]    Report Status 03/03/2021 FINAL  Final  MRSA Next Gen by PCR, Nasal     Status: None   Collection Time: 02/26/21  7:43 PM   Specimen: Nasal Mucosa; Nasal Swab  Result Value Ref Range Status   MRSA by PCR Next Gen NOT DETECTED NOT DETECTED Final    Comment: (NOTE) The GeneXpert MRSA Assay (FDA approved for NASAL specimens only), is one component of a comprehensive MRSA colonization surveillance program. It is not intended to diagnose MRSA  infection nor to guide or monitor treatment for MRSA infections. Test performance is not FDA approved in patients less than 52 years old. Performed at [facility], [address], [place], [place] [address]        Radiology Studies: No results found.    Scheduled Meds:  apixaban  5 mg Oral BID   bisoprolol  2.5 mg Oral BID   Chlorhexidine Gluconate Cloth  6 each Topical Q0600   dextromethorphan-guaiFENesin  1 tablet Oral BID   diltiazem  150 mg Oral Q12H   famotidine  20 mg Oral BID   feeding supplement  1 Container Oral TID BM   insulin aspart  0-5 Units Subcutaneous QHS   insulin aspart  0-9 Units Subcutaneous TID WC   insulin aspart  3 Units Subcutaneous TID WC   levothyroxine  200 mcg Oral Daily   LORazepam  1 mg Intravenous Once   LORazepam  0.5 mg Oral q AM   LORazepam  1 mg Oral QHS   melatonin  5 mg Oral QHS   predniSONE  50 mg Oral Q breakfast   umeclidinium-vilanterol  1 puff Inhalation Daily   Continuous Infusions:     LOS: 6 days    Dessa Phi, DO [facility] 03/04/2021, 12:44 PM   Available via Epic secure chat 7am-7pm After these hours, please refer to coverage provider listed on [email or web address]

## 2021-03-04 NOTE — Progress Notes (Signed)
Physical Therapy Treatment Patient Details Name: Summer Bradley MRN: [id number] DOB: 31-Dec-1945 Today's Date: 03/04/2021   History of Present Illness 76 y.o. female with medical history significant for atrial fibrillation, diastolic CHF, COPD, chronic respiratory failure 3L [place] at home, diabetes, history of R MCA stroke, hypertension, pulmonary hypertension, recent admission 01/18/21 - 01/21/21 for COPD exacerbation, who presents to the emergency department on 02/27/20 with shortness of breath. Patient has chronic shortness of breath but it began to worsen several days before admission, and duration is now constant. Chest x-ray showed cardiomegaly and bibasilar opacities. Head CT showed chronic Right MCA infarct, left frontal and parietal scalp hematomas, and pan-sinus disease. Patient was found to be in A fib with RVR. She was started on an IV diltiazem drip. She was placed on BiPAP.    PT Comments    Pt resting in bed upon PT arrival and calling out that she wants to get out of bed and that she wanted to use bathroom.  Pt then refused to toilet so therapist assisted pt with transferring bed to recliner.  During mobility to chair, pt repetitively calling out "I can't do this" in regards to every activity requiring encouragement to participate.  Max assist semi-supine to sitting edge of bed; SBA sitting balance; pt stood up to RW with mod assist but then sat down onto bed (even though therapist cued pt to keep standing); performed stand step turn bed to recliner with mod assist.  End of session, therapist set pt up in recliner with all needs in reach (and pt's daughter present); nursing and NT notified of pt's status and assist levels for transfer back to bed.  Per request, therapist returned to assist pt back to bed (pt consistently yelling out that she wanted back to bed although pt had not been up in recliner very long).  Mod assist stand step turn recliner to bed (1 vc during transfer to maintain  standing d/t pt starting to sit early).  2 assist back to bed and to reposition in bed.  Will continue to focus on strengthening and progressive functional mobility.   Recommendations for follow up therapy are one component of a multi-disciplinary discharge planning process, led by the attending physician.  Recommendations may be updated based on patient status, additional functional criteria and insurance authorization.  Follow Up Recommendations  Skilled nursing-short term rehab (<3 hours/day)     Assistance Recommended at Discharge Frequent or constant Supervision/Assistance  Patient can return home with the following Two people to help with walking and/or transfers;Two people to help with bathing/dressing/bathroom;Assistance with feeding;Assistance with cooking/housework;Assist for transportation;Help with stairs or ramp for entrance;Direct supervision/assist for medications management   Equipment Recommendations  Rolling walker (2 wheels);BSC/3in1;Wheelchair (measurements PT);Wheelchair cushion (measurements PT);Hospital bed    Recommendations for Other Services       Precautions / Restrictions Precautions Precautions: Fall Restrictions Weight Bearing Restrictions: No     Mobility  Bed Mobility Overal bed mobility: Needs Assistance Bed Mobility: Supine to Sit, Sit to Supine     Supine to sit: Max assist, HOB elevated Sit to supine: +2 for physical assistance, HOB elevated   General bed mobility comments: assist for trunk and B LE's; 2 assist back to bed for safety    Transfers Overall transfer level: Needs assistance Equipment used: Rolling walker (2 wheels), None Transfers: Sit to/from Stand, Bed to chair/wheelchair/BSC Sit to Stand: Mod assist   Step pivot transfers: Mod assist (stand step turn bed to/from recliner)  General transfer comment: mod assist to stand from bed up to RW x1 trial    Ambulation/Gait   Gait Distance (Feet):  (pt did not stand long  enough with RW to attempt ambulation)               Stairs             Wheelchair Mobility    Modified Rankin (Stroke Patients Only)       Balance Overall balance assessment: Needs assistance Sitting-balance support: Single extremity supported, Feet supported Sitting balance-Leahy Scale: Fair Sitting balance - Comments: steady static sitting   Standing balance support: Bilateral upper extremity supported Standing balance-Leahy Scale: Poor Standing balance comment: pt requiring B UE support and min assist for standing balance                            Cognition Arousal/Alertness: Awake/alert Behavior During Therapy: Restless, Impulsive, Anxious Overall Cognitive Status: Impaired/Different from baseline                                 General Comments: Oriented to self only; calls out throughout session        Exercises      General Comments  Nursing cleared pt for participation in physical therapy.  Pt's daughter present during sessions.      Pertinent Vitals/Pain Pain Assessment Pain Assessment: Faces Faces Pain Scale: Hurts a little bit Pain Location: R forearm Pain Descriptors / Indicators:  ("it hurts" and points to forearm) Pain Intervention(s): Limited activity within patient's tolerance, Monitored during session, Repositioned Vitals (HR and O2 on 4 L via nasal cannula) stable and WFL throughout treatment session.    Home Living                          Prior Function            PT Goals (current goals can now be found in the care plan section) Acute Rehab PT Goals Patient Stated Goal: To get up (and then get back to bed) PT Goal Formulation: With patient Time For Goal Achievement: 03/15/21 Potential to Achieve Goals: Fair Progress towards PT goals: Progressing toward goals    Frequency    Min 2X/week      PT Plan Current plan remains appropriate    Co-evaluation              AM-PAC  PT "6 Clicks" Mobility   Outcome Measure  Help needed turning from your back to your side while in a flat bed without using bedrails?: A Little Help needed moving from lying on your back to sitting on the side of a flat bed without using bedrails?: A Lot Help needed moving to and from a bed to a chair (including a wheelchair)?: A Lot Help needed standing up from a chair using your arms (e.g., wheelchair or bedside chair)?: A Lot Help needed to walk in hospital room?: Total Help needed climbing 3-5 steps with a railing? : Total 6 Click Score: 11    End of Session Equipment Utilized During Treatment: Gait belt;Oxygen (4 L via nasal cannula) Activity Tolerance: Patient limited by fatigue Patient left: in bed;with call bell/phone within reach;with bed alarm set;with family/visitor present;Other (comment) (B heels floating via pillow support) Nurse Communication: Mobility status;Precautions PT Visit Diagnosis: Muscle weakness (generalized) (M62.81);Difficulty in walking, not  elsewhere classified (R26.2);Unsteadiness on feet (R26.81)     Time: 7939-0300 (returned 1135-1150 to assist pt back to bed) PT Time Calculation (min) (ACUTE ONLY): 28 min  Charges:  $Therapeutic Activity: 38-52 mins                    Leitha Bleak, PT 03/04/21, 12:20 PM

## 2021-03-04 NOTE — TOC Progression Note (Signed)
Transition of Care [facility]) - Progression Note    Patient Details  Name: Summer Bradley MRN: [id number] Date of Birth: 1945-07-01  Transition of Care [facility]) CM/SW [place], [place] Phone Number: 03/04/2021, 10:02 AM  Clinical Narrative:     CSW updated Pace and Ricky with [facility] that patient will not be discharged today.   CSW will continue to update them on patient's medical readiness to dc to [facility].   TOC will continue to follow.    Expected Discharge Plan: [place] Barriers to Discharge: Continued Medical Work up  Expected Discharge Plan and Services Expected Discharge Plan: [place] Choice: [place] arrangements for the past 2 months: Single Family Home                                       Social Determinants of Health (SDOH) Interventions    Readmission Risk Interventions Readmission Risk Prevention Plan 11/18/2020 09/21/2019 09/20/2019  Transportation Screening Complete Complete Complete  PCP or Specialist Appt within 3-5 Days Complete Complete Complete  HRI or Home Care Consult Complete Complete Complete  Social Work Consult for [place] Planning/Counseling Complete - -  Palliative Care Screening Not Applicable Not Applicable Not Applicable  Medication Review Press photographer) Complete Complete Complete  Some recent data might be hidden

## 2021-03-05 ENCOUNTER — Encounter: Payer: Self-pay | Admitting: Internal Medicine

## 2021-03-05 DIAGNOSIS — I4891 Unspecified atrial fibrillation: Secondary | ICD-10-CM

## 2021-03-05 DIAGNOSIS — J441 Chronic obstructive pulmonary disease with (acute) exacerbation: Secondary | ICD-10-CM

## 2021-03-05 DIAGNOSIS — I5032 Chronic diastolic (congestive) heart failure: Secondary | ICD-10-CM

## 2021-03-05 LAB — GLUCOSE, CAPILLARY
Glucose-Capillary: 160 mg/dL — ABNORMAL HIGH (ref 70–99)
Glucose-Capillary: 178 mg/dL — ABNORMAL HIGH (ref 70–99)
Glucose-Capillary: 208 mg/dL — ABNORMAL HIGH (ref 70–99)

## 2021-03-05 MED ORDER — LORAZEPAM 0.5 MG PO TABS: 0.5000 mg | ORAL_TABLET | Freq: Every day | ORAL | 0 refills | Status: AC | PRN

## 2021-03-05 MED ORDER — LORAZEPAM 1 MG PO TABS: 1.0000 mg | ORAL_TABLET | Freq: Two times a day (BID) | ORAL | 0 refills | Status: AC

## 2021-03-05 MED ORDER — UMECLIDINIUM-VILANTEROL 62.5-25 MCG/ACT IN AEPB: 1.0000 | INHALATION_SPRAY | Freq: Every day | RESPIRATORY_TRACT | Status: AC

## 2021-03-05 NOTE — TOC Transition Note (Addendum)
Transition of Care [facility]) - CM/SW Discharge Note   Patient Details  Name: Summer Bradley MRN: [id number] Date of Birth: 06/22/1945  Transition of Care [facility]) CM/SW Contact:  Alberteen Sam, LCSW Phone Number: 03/05/2021, 1:47 PM   Clinical Narrative:     [facility] Anticipated DC date: 03/05/21 Family notified: daughter Development worker, community by: Claudia Desanctis  Per MD patient ready for DC to . RN, patient, patient's family, and facility notified of DC. Discharge Summary sent to facility. RN given number for report  [phone] Room 508a. DC packet on chart. Ambulance transport requested for patient.  CSW signing off.  Pricilla Riffle, LCSW    Final next level of care: Skilled Nursing Facility Barriers to Discharge: No Barriers Identified   Patient Goals and CMS Choice Patient states their goals for this hospitalization and ongoing recovery are:: POA [name] CMS Medicare.gov Compare Post Acute Care list provided to:: Patient Represenative (must comment) (POA [name]) Choice offered to / list presented to : Adult Children, HC POA / Guardian  Discharge Placement                Patient to be transferred to facility by: [facility] Name of family member notified: [name] Patient and family notified of of transfer: 03/05/21  Discharge Plan and Services     Post Acute Care Choice: [place]                               Social Determinants of Health (SDOH) Interventions     Readmission Risk Interventions Readmission Risk Prevention Plan 11/18/2020 09/21/2019 09/20/2019  Transportation Screening Complete Complete Complete  PCP or Specialist Appt within 3-5 Days Complete Complete Complete  HRI or Home Care Consult Complete Complete Complete  Social Work Consult for [place] Planning/Counseling Complete - -  Palliative Care Screening Not Applicable Not Applicable Not Applicable  Medication Review Press photographer) Complete Complete Complete  Some recent  data might be hidden

## 2021-03-05 NOTE — Plan of Care (Signed)

## 2021-03-05 NOTE — Progress Notes (Signed)
Discharge report called to [facility] @ [phone].  Report given to receiving nurse DeDe Al Corpus, RN.  Discharge packet & scripts placed with chart per [facility], LCSW.

## 2021-03-05 NOTE — Care Management Important Message (Signed)
Important Message  Patient Details  Name: Summer Bradley MRN: [id number] Date of Birth: 1945/07/23   Medicare Important Message Given:  Yes     [name] 03/05/2021, 11:25 AM

## 2021-03-05 NOTE — TOC Progression Note (Addendum)
Transition of Care [facility]) - Progression Note    Patient Details  Name: Summer Bradley MRN: [id number] Date of Birth: 1945-04-15  Transition of Care [facility]) CM/SW [place], [place] Phone Number: 03/05/2021, 11:45 AM  Clinical Narrative:     Update: [facility] able to make a bed offer.    CSW spoke with patient's POA [name] who reports although they previously chose [facility], they report wanting to look into if [facility] will be abe to accept. CSW explained only two bed offers right now were [facility] or [facility], however this CSW has reached out to [facility] to review patient's referral. Pending acceptance. CSW encouraged [name] to discuss with family if [facility] does not make a bed offer, to determine if patient will go [facility] or [facility].     Expected Discharge Plan: [place] Barriers to Discharge: Continued Medical Work up  Expected Discharge Plan and Services Expected Discharge Plan: [place] Choice: [place] arrangements for the past 2 months: Single Family Home                                       Social Determinants of Health (SDOH) Interventions    Readmission Risk Interventions Readmission Risk Prevention Plan 11/18/2020 09/21/2019 09/20/2019  Transportation Screening Complete Complete Complete  PCP or Specialist Appt within 3-5 Days Complete Complete Complete  HRI or Home Care Consult Complete Complete Complete  Social Work Consult for [place] Planning/Counseling Complete - -  Palliative Care Screening Not Applicable Not Applicable Not Applicable  Medication Review Press photographer) Complete Complete Complete  Some recent data might be hidden

## 2021-03-05 NOTE — Discharge Summary (Addendum)
Physician Discharge Summary  Summer Bradley EUM:[id number] DOB: 1946/02/16 DOA: 02/26/2021  PCP: [place] date: 02/26/2021 Discharge date: 03/05/2021  Discharge disposition: SNF   Recommendations for Outpatient Follow-Up:   Follow-up with physician at the nursing home within 3 days of discharge   Discharge Diagnosis:   Principal Problem:   Acute and chronic respiratory failure with hypoxia ([name]) Active Problems:   Essential hypertension   COPD with acute exacerbation ([place])   Chronic heart failure with preserved ejection fraction (HFpEF) (HCC)   Atrial fibrillation with rapid ventricular response (HCC)   Hypothyroidism   GERD (gastroesophageal reflux disease)   Lactic acidosis   Pneumonia due to gram-negative bacteria ([place])    Discharge Condition: Stable.  Diet recommendation:  Diet Order             Diet - low sodium heart healthy           Diet regular Room service appropriate? Yes; Fluid consistency: Thin  Diet effective now                     Code Status: DNR     Hospital Course:   Ms. Summer Bradley is a 76 year old woman with medical history significant for atrial fibrillation, chronic diastolic CHF, COPD, chronic hypoxemic respiratory failure on 3 L/min oxygen, diabetes, history of right MCA stroke, hypertension, pulmonary hypertension, recent hospitalization from 01/18/2021 through 01/22/2019 for COPD exacerbation.  She presented to the hospital with shortness of breath.   She was found to have atrial fibrillation with rapid ventricular response acute hypoxemic respiratory failure.  She was treated with IV Cardizem infusion and required BiPAP for acute respiratory failure.  She was treated with steroids, bronchodilators and empiric antibiotics for COPD exacerbation and suspected pneumonia.  Her condition has improved and she is deemed stable for discharge to SNF today.  Discharge plan was discussed with [name], her daughter  and HPOA.  She wanted patient to continue Seroquel as needed for agitation.       Discharge Exam:    Vitals:   03/05/21 0750 03/05/21 1142 03/05/21 1211 03/05/21 1215  BP: 122/83 (!) 204/135 106/74 106/74  Pulse: 96 (!) 57 64 (!) 58  Resp: 20 20 20 18   Temp: (!) 97.5 F (36.4 C) 97.6 F (36.4 C)  98.1 F (36.7 C)  TempSrc:   Oral   SpO2: 95% 97% 98% 97%  Weight:      Height:         GEN: NAD SKIN: Warm and dry.  Small skin tear on the left arm EYES: No pallor or icterus ENT: MMM CV: RRR PULM: CTA B ABD: soft, obese, NT, +BS CNS: AAO x 2 (person and place), non focal EXT: No edema or tenderness PSYCH: She yells "get me out of here" from time to time and she is not very cooperative.   The results of significant diagnostics from this hospitalization (including imaging, microbiology, ancillary and laboratory) are listed below for reference.     Procedures and Diagnostic Studies:   CT HEAD WO CONTRAST (5MM)  Result Date: 02/26/2021 CLINICAL DATA:  Head trauma EXAM: CT HEAD WITHOUT CONTRAST TECHNIQUE: Contiguous axial images were obtained from the base of the skull through the vertex without intravenous contrast. RADIATION DOSE REDUCTION: This exam was performed according to the departmental dose-optimization program which includes automated exposure control, adjustment of the mA and/or kV according to patient size and/or use of iterative reconstruction technique. COMPARISON:  12/18/2020 FINDINGS: Brain: Redemonstrated sequela of prior large right MCA territory infarcts, with associated encephalomalacia and ex vacuo dilatation of the right lateral ventricle. Additional lacunar infarcts in the bilateral cerebellar hemispheres and pons. No new hypodensity to suggest acute infarct. No acute hemorrhage, mass, mass effect, or midline shift. Periventricular white matter changes, likely the sequela of chronic small vessel ischemic disease. Vascular: No hyperdense vessel.  Atherosclerotic calcifications in the intracranial carotid and vertebral arteries. Skull: Normal. Negative for fracture or focal lesion. Sinuses/Orbits: Near-complete opacification of the right maxillary sinus. Partial opacification of the left maxillary sinus, bilateral frontal and sphenoid sinuses, and ethmoid air cells. Status post bilateral lens replacements. No acute orbital abnormality. Other: Fluid in the right mastoid air cells. Left frontal and parietal scalp hematoma. IMPRESSION: 1. No acute intracranial process. Redemonstrated sequela of chronic right MCA infarct. 2. Pansinus disease, which is new from the prior exam, and can be seen in the setting of acute sinusitis. Correlate with symptoms. Electronically Signed   By: Merilyn Baba M.D.   On: 02/26/2021 13:52   DG Chest Portable 1 View  Result Date: 02/26/2021 CLINICAL DATA:  Shortness of breath, fell last night, altered mental status, labored breathing EXAM: PORTABLE CHEST 1 VIEW COMPARISON:  Portable exam 1153 hours compared to 01/23/2021 FINDINGS: Enlargement of cardiac silhouette. Atherosclerotic calcification aorta. Mediastinal contours and pulmonary vascularity normal. Bibasilar opacities question atelectasis versus infiltrate. Upper lungs clear. No pleural effusion or pneumothorax. Bones demineralized. IMPRESSION: Enlargement of cardiac silhouette with bibasilar opacities question atelectasis versus infiltrate. Electronically Signed   By: Lavonia Dana M.D.   On: 02/26/2021 12:03     Labs:   Basic Metabolic Panel: Recent Labs  Lab 02/27/21 0251 02/27/21 1211 02/28/21 0420 03/01/21 0448 03/03/21 0523  NA 136  --  138 140 145  K 5.5*   < > 4.2 4.2 3.7  CL 105  --  105 102 104  CO2 24  --  26 28 29   GLUCOSE 227*  --  189* 172* 135*  BUN 23  --  42* 51* 40*  CREATININE 1.17*  --  1.24* 1.08* 0.96  CALCIUM 9.1  --  9.2 9.4 9.2   < > = values in this interval not displayed.   GFR Estimated Creatinine Clearance: 53.8 mL/min  (by C-G formula based on SCr of 0.96 mg/dL). Liver Function Tests: No results for input(s): AST, ALT, ALKPHOS, BILITOT, PROT, ALBUMIN in the last 168 hours. No results for input(s): LIPASE, AMYLASE in the last 168 hours. No results for input(s): AMMONIA in the last 168 hours. Coagulation profile Recent Labs  Lab 02/26/21 1714  INR 1.7*    CBC: Recent Labs  Lab 02/27/21 0251 02/28/21 0420 03/01/21 0448 03/03/21 0523  WBC 13.7* 19.2* 17.4* 15.5*  HGB 10.8* 9.9* 10.2* 11.1*  HCT 36.1 33.6* 35.3* 37.6  MCV 78.1* 78.7* 79.0* 78.8*  PLT 477* 435* 493* 508*   Cardiac Enzymes: No results for input(s): CKTOTAL, CKMB, CKMBINDEX, TROPONINI in the last 168 hours. BNP: Invalid input(s): POCBNP CBG: Recent Labs  Lab 03/04/21 1710 03/04/21 2012 03/05/21 0010 03/05/21 0756 03/05/21 1143  GLUCAP 191* 235* 160* 178* 208*   D-Dimer No results for input(s): DDIMER in the last 72 hours. Hgb A1c No results for input(s): HGBA1C in the last 72 hours. Lipid Profile No results for input(s): CHOL, HDL, LDLCALC, TRIG, CHOLHDL, LDLDIRECT in the last 72 hours. Thyroid function studies No results for input(s): TSH, T4TOTAL, T3FREE, THYROIDAB in the last 72 hours.  Invalid input(s): FREET3 Anemia work up No results for input(s): VITAMINB12, FOLATE, FERRITIN, TIBC, IRON, RETICCTPCT in the last 72 hours. Microbiology Recent Results (from the past 240 hour(s))  Resp Panel by RT-PCR (Flu A&B, Covid) Nasopharyngeal Swab     Status: None   Collection Time: 02/26/21 11:50 AM   Specimen: Nasopharyngeal Swab; Nasopharyngeal(NP) swabs in vial transport medium  Result Value Ref Range Status   SARS Coronavirus 2 by RT PCR NEGATIVE NEGATIVE Final    Comment: (NOTE) SARS-CoV-2 target nucleic acids are NOT DETECTED.  The SARS-CoV-2 RNA is generally detectable in upper respiratory specimens during the acute phase of infection. The lowest concentration of SARS-CoV-2 viral copies this assay can detect  is 138 copies/mL. A negative result does not preclude SARS-Cov-2 infection and should not be used as the sole basis for treatment or other patient management decisions. A negative result may occur with  improper specimen collection/handling, submission of specimen other than nasopharyngeal swab, presence of viral mutation(s) within the areas targeted by this assay, and inadequate number of viral copies(<138 copies/mL). A negative result must be combined with clinical observations, patient history, and epidemiological information. The expected result is Negative.  Fact Sheet for Patients:  EntrepreneurPulse.com.au  Fact Sheet for Healthcare Providers:  IncredibleEmployment.be  This test is no t yet approved or cleared by the [place] FDA and  has been authorized for detection and/or diagnosis of SARS-CoV-2 by FDA under an Emergency Use Authorization (EUA). This EUA will remain  in effect (meaning this test can be used) for the duration of the COVID-19 declaration under Section 564(b)(1) of the Act, 21 U.S.C.section 360bbb-3(b)(1), unless the authorization is terminated  or revoked sooner.       Influenza A by PCR NEGATIVE NEGATIVE Final   Influenza B by PCR NEGATIVE NEGATIVE Final    Comment: (NOTE) The Xpert Xpress SARS-CoV-2/FLU/RSV plus assay is intended as an aid in the diagnosis of influenza from Nasopharyngeal swab specimens and should not be used as a sole basis for treatment. Nasal washings and aspirates are unacceptable for Xpert Xpress SARS-CoV-2/FLU/RSV testing.  Fact Sheet for Patients: EntrepreneurPulse.com.au  Fact Sheet for Healthcare Providers: IncredibleEmployment.be  This test is not yet approved or cleared by the [place] FDA and has been authorized for detection and/or diagnosis of SARS-CoV-2 by FDA under an Emergency Use Authorization (EUA). This EUA will remain in effect  (meaning this test can be used) for the duration of the COVID-19 declaration under Section 564(b)(1) of the Act, 21 U.S.C. section 360bbb-3(b)(1), unless the authorization is terminated or revoked.  Performed at [facility], [address] [place], [place] [address]   Blood Culture (routine x 2)     Status: None   Collection Time: 02/26/21 11:50 AM   Specimen: BLOOD  Result Value Ref Range Status   Specimen Description BLOOD RIGHT ANTECUBITAL  Final   Special Requests BOTTLES DRAWN AEROBIC AND ANAEROBIC BCAV  Final   Culture   Final    NO GROWTH 5 DAYS Performed at [facility], [address] [place], [place] [address]    Report Status 03/03/2021 FINAL  Final  Blood Culture (routine x 2)     Status: None   Collection Time: 02/26/21 12:06 PM   Specimen: BLOOD  Result Value Ref Range Status   Specimen Description BLOOD LEFT ANTECUBITAL  Final   Special Requests BOTTLES DRAWN AEROBIC AND ANAEROBIC BCAV  Final   Culture   Final    NO GROWTH 5  DAYS Performed at [facility], [address] [place], [place] [address]    Report Status 03/03/2021 FINAL  Final  MRSA Next Gen by PCR, Nasal     Status: None   Collection Time: 02/26/21  7:43 PM   Specimen: Nasal Mucosa; Nasal Swab  Result Value Ref Range Status   MRSA by PCR Next Gen NOT DETECTED NOT DETECTED Final    Comment: (NOTE) The GeneXpert MRSA Assay (FDA approved for NASAL specimens only), is one component of a comprehensive MRSA colonization surveillance program. It is not intended to diagnose MRSA infection nor to guide or monitor treatment for MRSA infections. Test performance is not FDA approved in patients less than 45 years old. Performed at [facility], [address], [place], [place] [address]      Discharge Instructions:   Discharge Instructions     Diet - low sodium heart healthy   Complete by: As directed    Increase activity slowly   Complete by: As  directed       Allergies as of 03/05/2021       Reactions   Lisinopril Cough   Amoxicillin         Medication List     STOP taking these medications    cephALEXin 500 MG capsule Commonly known as: KEFLEX       TAKE these medications    acetaminophen 325 MG tablet Commonly known as: TYLENOL Take 325-650 mg by mouth 3 (three) times daily as needed for mild pain or moderate pain.   albuterol 108 (90 Base) MCG/ACT inhaler Commonly known as: VENTOLIN HFA Inhale 2 puffs into the lungs every 4 (four) hours as needed for wheezing or shortness of breath.   albuterol (2.5 MG/3ML) 0.083% nebulizer solution Commonly known as: PROVENTIL Take 2.5 mg by nebulization every 6 (six) hours as needed for wheezing or shortness of breath.   apixaban 5 MG Tabs tablet Commonly known as: ELIQUIS Take 1 tablet (5 mg total) by mouth 2 (two) times daily.   bisoprolol 5 MG tablet Commonly known as: ZEBETA Take 0.5 tablets (2.5 mg total) by mouth 2 (two) times daily.   diltiazem 300 MG 24 hr capsule Commonly known as: CARDIZEM CD Take 1 capsule (300 mg total) by mouth daily.   EUCERIN EX Apply 1 application topically 2 (two) times daily as needed.   famotidine 20 MG tablet Commonly known as: PEPCID Take 1 tablet (20 mg total) by mouth daily. What changed: when to take this   furosemide 40 MG tablet Commonly known as: LASIX Take 40 mg by mouth daily.   levothyroxine 200 MCG tablet Commonly known as: SYNTHROID Take 200 mcg by mouth daily.   LORazepam 0.5 MG tablet Commonly known as: ATIVAN Take 1 tablet (0.5 mg total) by mouth daily as needed for anxiety.   LORazepam 1 MG tablet Commonly known as: ATIVAN Take 1 tablet (1 mg total) by mouth 2 (two) times daily.   melatonin 3 MG Tabs tablet Take 3-6 mg by mouth at bedtime.   metFORMIN 500 MG tablet Commonly known as: GLUCOPHAGE Take 500 mg by mouth 2 (two) times daily.   metolazone 2.5 MG tablet Commonly known as:  ZAROXOLYN Take 2.5 mg by mouth every Wednesday. On Wednesdays with furosemide in the am   PARoxetine 20 MG tablet Commonly known as: PAXIL Take 20 mg by mouth every evening.   potassium chloride SA 20 MEQ tablet Commonly known as: KLOR-CON M Take 2 tablets (40  mEq total) by mouth 2 (two) times daily.   predniSONE 5 MG tablet Commonly known as: DELTASONE Take 5 mg by mouth daily.   QUEtiapine 25 MG tablet Commonly known as: SEROQUEL Take 1 tablet (25 mg total) by mouth at bedtime as needed.   senna 8.6 MG Tabs tablet Commonly known as: SENOKOT Take 1 tablet by mouth at bedtime.   umeclidinium-vilanterol 62.5-25 MCG/ACT Aepb Commonly known as: ANORO ELLIPTA Inhale 1 puff into the lungs daily. Start taking on: March 06, 2021           If you experience worsening of your admission symptoms, develop shortness of breath, life threatening emergency, suicidal or homicidal thoughts you must seek medical attention immediately by calling 911 or calling your MD immediately  if symptoms less severe.   You must read complete instructions/literature along with all the possible adverse reactions/side effects for all the medicines you take and that have been prescribed to you. Take any new medicines after you have completely understood and accept all the possible adverse reactions/side effects.    Please note   You were cared for by a hospitalist during your hospital stay. If you have any questions about your discharge medications or the care you received while you were in the hospital after you are discharged, you can call the unit and asked to speak with the hospitalist on call if the hospitalist that took care of you is not available. Once you are discharged, your primary care physician will handle any further medical issues. Please note that NO REFILLS for any discharge medications will be authorized once you are discharged, as it is imperative that you return to your primary care  physician (or establish a relationship with a primary care physician if you do not have one) for your aftercare needs so that they can reassess your need for medications and monitor your lab values.       Time coordinating discharge: 36 minutes  Signed:  Nayan Proch  [facility] 03/05/2021, 1:21 PM   Pager on [email or web address]. If 7PM-7AM, please contact night-coverage at [email or web address]

## 2021-03-12 LAB — BLOOD GAS, VENOUS
Acid-Base Excess: 3.7 mmol/L — ABNORMAL HIGH (ref 0.0–2.0)
Bicarbonate: 30.1 mmol/L — ABNORMAL HIGH (ref 20.0–28.0)
O2 Saturation: 16 %
Patient temperature: 37
pCO2, Ven: 52 mmHg (ref 44.0–60.0)
pH, Ven: 7.37 (ref 7.250–7.430)

## 2021-07-14 ENCOUNTER — Inpatient Hospital Stay: Payer: Medicare (Managed Care)

## 2021-07-14 ENCOUNTER — Inpatient Hospital Stay
Admission: EM | Admit: 2021-07-14 | Discharge: 2021-08-16 | DRG: 189 | Disposition: E | Payer: Medicare (Managed Care) | Attending: Internal Medicine | Admitting: Internal Medicine

## 2021-07-14 ENCOUNTER — Other Ambulatory Visit: Payer: Self-pay

## 2021-07-14 ENCOUNTER — Emergency Department: Payer: Medicare (Managed Care)

## 2021-07-14 ENCOUNTER — Encounter: Payer: Self-pay | Admitting: Internal Medicine

## 2021-07-14 DIAGNOSIS — J9811 Atelectasis: Secondary | ICD-10-CM | POA: Diagnosis present

## 2021-07-14 DIAGNOSIS — I4819 Other persistent atrial fibrillation: Secondary | ICD-10-CM | POA: Diagnosis present

## 2021-07-14 DIAGNOSIS — R0603 Acute respiratory distress: Secondary | ICD-10-CM | POA: Diagnosis not present

## 2021-07-14 DIAGNOSIS — I110 Hypertensive heart disease with heart failure: Secondary | ICD-10-CM | POA: Diagnosis present

## 2021-07-14 DIAGNOSIS — E872 Acidosis, unspecified: Secondary | ICD-10-CM | POA: Diagnosis present

## 2021-07-14 DIAGNOSIS — Z7901 Long term (current) use of anticoagulants: Secondary | ICD-10-CM | POA: Diagnosis not present

## 2021-07-14 DIAGNOSIS — A419 Sepsis, unspecified organism: Secondary | ICD-10-CM

## 2021-07-14 DIAGNOSIS — Z87891 Personal history of nicotine dependence: Secondary | ICD-10-CM | POA: Diagnosis not present

## 2021-07-14 DIAGNOSIS — J439 Emphysema, unspecified: Secondary | ICD-10-CM | POA: Diagnosis present

## 2021-07-14 DIAGNOSIS — Z515 Encounter for palliative care: Secondary | ICD-10-CM | POA: Diagnosis not present

## 2021-07-14 DIAGNOSIS — Z9981 Dependence on supplemental oxygen: Secondary | ICD-10-CM | POA: Diagnosis not present

## 2021-07-14 DIAGNOSIS — E119 Type 2 diabetes mellitus without complications: Secondary | ICD-10-CM

## 2021-07-14 DIAGNOSIS — Z79890 Hormone replacement therapy (postmenopausal): Secondary | ICD-10-CM

## 2021-07-14 DIAGNOSIS — Z8673 Personal history of transient ischemic attack (TIA), and cerebral infarction without residual deficits: Secondary | ICD-10-CM

## 2021-07-14 DIAGNOSIS — F419 Anxiety disorder, unspecified: Secondary | ICD-10-CM | POA: Diagnosis present

## 2021-07-14 DIAGNOSIS — T380X5A Adverse effect of glucocorticoids and synthetic analogues, initial encounter: Secondary | ICD-10-CM | POA: Diagnosis present

## 2021-07-14 DIAGNOSIS — E7800 Pure hypercholesterolemia, unspecified: Secondary | ICD-10-CM | POA: Diagnosis present

## 2021-07-14 DIAGNOSIS — Z79899 Other long term (current) drug therapy: Secondary | ICD-10-CM

## 2021-07-14 DIAGNOSIS — Z20822 Contact with and (suspected) exposure to covid-19: Secondary | ICD-10-CM | POA: Diagnosis present

## 2021-07-14 DIAGNOSIS — K439 Ventral hernia without obstruction or gangrene: Secondary | ICD-10-CM | POA: Diagnosis present

## 2021-07-14 DIAGNOSIS — Z66 Do not resuscitate: Secondary | ICD-10-CM | POA: Diagnosis present

## 2021-07-14 DIAGNOSIS — J189 Pneumonia, unspecified organism: Secondary | ICD-10-CM | POA: Diagnosis present

## 2021-07-14 DIAGNOSIS — J9621 Acute and chronic respiratory failure with hypoxia: Principal | ICD-10-CM | POA: Diagnosis present

## 2021-07-14 DIAGNOSIS — E1165 Type 2 diabetes mellitus with hyperglycemia: Secondary | ICD-10-CM | POA: Diagnosis present

## 2021-07-14 DIAGNOSIS — I4891 Unspecified atrial fibrillation: Secondary | ICD-10-CM | POA: Diagnosis present

## 2021-07-14 DIAGNOSIS — G9341 Metabolic encephalopathy: Secondary | ICD-10-CM | POA: Diagnosis present

## 2021-07-14 DIAGNOSIS — K219 Gastro-esophageal reflux disease without esophagitis: Secondary | ICD-10-CM | POA: Diagnosis present

## 2021-07-14 DIAGNOSIS — D509 Iron deficiency anemia, unspecified: Secondary | ICD-10-CM | POA: Diagnosis present

## 2021-07-14 DIAGNOSIS — Z7951 Long term (current) use of inhaled steroids: Secondary | ICD-10-CM

## 2021-07-14 DIAGNOSIS — Z7984 Long term (current) use of oral hypoglycemic drugs: Secondary | ICD-10-CM

## 2021-07-14 DIAGNOSIS — J449 Chronic obstructive pulmonary disease, unspecified: Secondary | ICD-10-CM | POA: Diagnosis not present

## 2021-07-14 DIAGNOSIS — J9601 Acute respiratory failure with hypoxia: Secondary | ICD-10-CM

## 2021-07-14 DIAGNOSIS — D72829 Elevated white blood cell count, unspecified: Secondary | ICD-10-CM | POA: Diagnosis present

## 2021-07-14 DIAGNOSIS — Z7189 Other specified counseling: Secondary | ICD-10-CM | POA: Diagnosis not present

## 2021-07-14 DIAGNOSIS — J441 Chronic obstructive pulmonary disease with (acute) exacerbation: Secondary | ICD-10-CM | POA: Diagnosis not present

## 2021-07-14 DIAGNOSIS — Z7952 Long term (current) use of systemic steroids: Secondary | ICD-10-CM

## 2021-07-14 DIAGNOSIS — I5032 Chronic diastolic (congestive) heart failure: Secondary | ICD-10-CM | POA: Diagnosis present

## 2021-07-14 DIAGNOSIS — R140 Abdominal distension (gaseous): Secondary | ICD-10-CM | POA: Diagnosis present

## 2021-07-14 DIAGNOSIS — I10 Essential (primary) hypertension: Secondary | ICD-10-CM | POA: Diagnosis present

## 2021-07-14 DIAGNOSIS — I5042 Chronic combined systolic (congestive) and diastolic (congestive) heart failure: Secondary | ICD-10-CM | POA: Diagnosis present

## 2021-07-14 DIAGNOSIS — E039 Hypothyroidism, unspecified: Secondary | ICD-10-CM | POA: Diagnosis present

## 2021-07-14 DIAGNOSIS — D649 Anemia, unspecified: Secondary | ICD-10-CM | POA: Diagnosis present

## 2021-07-14 DIAGNOSIS — R0602 Shortness of breath: Secondary | ICD-10-CM | POA: Diagnosis present

## 2021-07-14 LAB — COMPREHENSIVE METABOLIC PANEL
ALT: 18 U/L (ref 0–44)
AST: 27 U/L (ref 15–41)
Albumin: 3.5 g/dL (ref 3.5–5.0)
Alkaline Phosphatase: 58 U/L (ref 38–126)
Anion gap: 5 (ref 5–15)
BUN: 20 mg/dL (ref 8–23)
CO2: 27 mmol/L (ref 22–32)
Calcium: 9 mg/dL (ref 8.9–10.3)
Chloride: 108 mmol/L (ref 98–111)
Creatinine, Ser: 0.76 mg/dL (ref 0.44–1.00)
GFR, Estimated: >60 mL/min (ref >60)
Glucose, Bld: 114 mg/dL — ABNORMAL HIGH (ref 70–99)
Potassium: 4.1 mmol/L (ref 3.5–5.1)
Sodium: 140 mmol/L (ref 135–145)
Total Bilirubin: 1.5 mg/dL — ABNORMAL HIGH (ref 0.3–1.2)
Total Protein: 6.9 g/dL (ref 6.5–8.1)

## 2021-07-14 LAB — CBC WITH DIFFERENTIAL/PLATELET
Abs Immature Granulocytes: 0.07 10*3/uL (ref 0.00–0.07)
Basophils Absolute: 0.1 10*3/uL (ref 0.0–0.1)
Basophils Relative: 1 %
Eosinophils Absolute: 0.3 10*3/uL (ref 0.0–0.5)
Eosinophils Relative: 2 %
HCT: 34.4 % — ABNORMAL LOW (ref 36.0–46.0)
Hemoglobin: 9.5 g/dL — ABNORMAL LOW (ref 12.0–15.0)
Immature Granulocytes: 1 %
Lymphocytes Relative: 17 %
Lymphs Abs: 2.4 10*3/uL (ref 0.7–4.0)
MCH: 19.8 pg — ABNORMAL LOW (ref 26.0–34.0)
MCHC: 27.6 g/dL — ABNORMAL LOW (ref 30.0–36.0)
MCV: 71.8 fL — ABNORMAL LOW (ref 80.0–100.0)
Monocytes Absolute: 1.2 10*3/uL — ABNORMAL HIGH (ref 0.1–1.0)
Monocytes Relative: 8 %
Neutro Abs: 10.3 10*3/uL — ABNORMAL HIGH (ref 1.7–7.7)
Neutrophils Relative %: 71 %
Platelets: 444 10*3/uL — ABNORMAL HIGH (ref 150–400)
RBC: 4.79 MIL/uL (ref 3.87–5.11)
RDW: 20.6 % — ABNORMAL HIGH (ref 11.5–15.5)
WBC: 14.4 10*3/uL — ABNORMAL HIGH (ref 4.0–10.5)
nRBC: 0.6 % — ABNORMAL HIGH (ref 0.0–0.2)

## 2021-07-14 LAB — IRON AND TIBC
Iron: 21 ug/dL — ABNORMAL LOW (ref 28–170)
Saturation Ratios: 4 % — ABNORMAL LOW (ref 10.4–31.8)
TIBC: 473 ug/dL — ABNORMAL HIGH (ref 250–450)
UIBC: 452 ug/dL

## 2021-07-14 LAB — RETICULOCYTES
Immature Retic Fract: 27.2 % — ABNORMAL HIGH (ref 2.3–15.9)
RBC.: 4.35 MIL/uL (ref 3.87–5.11)
Retic Count, Absolute: 90.9 10*3/uL (ref 19.0–186.0)
Retic Ct Pct: 2.1 % (ref 0.4–3.1)

## 2021-07-14 LAB — LACTIC ACID, PLASMA
Lactic Acid, Venous: 3 mmol/L (ref 0.5–1.9)
Lactic Acid, Venous: 4.4 mmol/L (ref 0.5–1.9)

## 2021-07-14 LAB — BRAIN NATRIURETIC PEPTIDE: B Natriuretic Peptide: 302.8 pg/mL — ABNORMAL HIGH (ref 0.0–100.0)

## 2021-07-14 LAB — GLUCOSE, CAPILLARY
Glucose-Capillary: 164 mg/dL — ABNORMAL HIGH (ref 70–99)
Glucose-Capillary: 154 mg/dL — ABNORMAL HIGH (ref 70–99)

## 2021-07-14 LAB — CBG MONITORING, ED
Glucose-Capillary: 179 mg/dL — ABNORMAL HIGH (ref 70–99)
Glucose-Capillary: 244 mg/dL — ABNORMAL HIGH (ref 70–99)

## 2021-07-14 LAB — SARS CORONAVIRUS 2 BY RT PCR: SARS Coronavirus 2 by RT PCR: NEGATIVE

## 2021-07-14 LAB — MRSA NEXT GEN BY PCR, NASAL: MRSA by PCR Next Gen: NOT DETECTED

## 2021-07-14 LAB — VITAMIN B12: Vitamin B-12: 302 pg/mL (ref 180–914)

## 2021-07-14 LAB — PROCALCITONIN: Procalcitonin: <0.1 ng/mL

## 2021-07-14 LAB — HEMOGLOBIN A1C
Hgb A1c MFr Bld: 6.7 % — ABNORMAL HIGH (ref 4.8–5.6)
Mean Plasma Glucose: 145.59 mg/dL

## 2021-07-14 LAB — TROPONIN I (HIGH SENSITIVITY)
Troponin I (High Sensitivity): 11 ng/L (ref <18)
Troponin I (High Sensitivity): 10 ng/L (ref <18)

## 2021-07-14 LAB — HIV ANTIBODY (ROUTINE TESTING W REFLEX): HIV Screen 4th Generation wRfx: NONREACTIVE

## 2021-07-14 LAB — FOLATE: Folate: 8.2 ng/mL (ref >5.9)

## 2021-07-14 LAB — FERRITIN: Ferritin: 17 ng/mL (ref 11–307)

## 2021-07-14 LAB — TSH: TSH: 1.114 u[IU]/mL (ref 0.350–4.500)

## 2021-07-14 MED ORDER — METHYLPREDNISOLONE SODIUM SUCC 40 MG IJ SOLR
40.0000 mg | Freq: Two times a day (BID) | INTRAMUSCULAR | Status: AC
  Administered 2021-07-14 – 2021-07-15 (×2): 40 mg via INTRAVENOUS
  Filled 2021-07-14 (×2): qty 1

## 2021-07-14 MED ORDER — APIXABAN 5 MG PO TABS
5.0000 mg | ORAL_TABLET | Freq: Two times a day (BID) | ORAL | Status: DC
  Administered 2021-07-14: 5 mg via ORAL
  Filled 2021-07-14: qty 1

## 2021-07-14 MED ORDER — ALBUTEROL SULFATE (2.5 MG/3ML) 0.083% IN NEBU
2.5000 mg | INHALATION_SOLUTION | RESPIRATORY_TRACT | Status: DC | PRN
  Administered 2021-07-14 (×2): 2.5 mg via RESPIRATORY_TRACT
  Filled 2021-07-14 (×2): qty 3

## 2021-07-14 MED ORDER — DILTIAZEM HCL-DEXTROSE 125-5 MG/125ML-% IV SOLN (PREMIX)
5.0000 mg/h | INTRAVENOUS | Status: DC
  Administered 2021-07-14 – 2021-07-15 (×2): 5 mg/h via INTRAVENOUS
  Administered 2021-07-16: 10 mg/h via INTRAVENOUS
  Administered 2021-07-17: 5 mg/h via INTRAVENOUS
  Filled 2021-07-14 (×4): qty 125

## 2021-07-14 MED ORDER — IPRATROPIUM-ALBUTEROL 0.5-2.5 (3) MG/3ML IN SOLN
6.0000 mL | Freq: Once | RESPIRATORY_TRACT | Status: AC
  Administered 2021-07-14: 6 mL via RESPIRATORY_TRACT
  Filled 2021-07-14: qty 6

## 2021-07-14 MED ORDER — LEVOTHYROXINE SODIUM 100 MCG PO TABS
200.0000 ug | ORAL_TABLET | Freq: Every day | ORAL | Status: DC
Start: 2021-07-15 — End: 2021-07-17

## 2021-07-14 MED ORDER — LORAZEPAM 1 MG PO TABS
1.0000 mg | ORAL_TABLET | Freq: Three times a day (TID) | ORAL | Status: DC | PRN
Start: 2021-07-14 — End: 2021-07-15

## 2021-07-14 MED ORDER — ACETAMINOPHEN 325 MG PO TABS: 650.0000 mg | ORAL_TABLET | Freq: Four times a day (QID) | ORAL | Status: DC | PRN

## 2021-07-14 MED ORDER — HALOPERIDOL LACTATE 5 MG/ML IJ SOLN
2.0000 mg | Freq: Two times a day (BID) | INTRAMUSCULAR | Status: DC | PRN
  Administered 2021-07-14 – 2021-07-15 (×2): 2 mg via INTRAVENOUS
  Filled 2021-07-14 (×2): qty 1

## 2021-07-14 MED ORDER — CHLORHEXIDINE GLUCONATE CLOTH 2 % EX PADS
6.0000 | MEDICATED_PAD | Freq: Every day | CUTANEOUS | Status: DC
  Administered 2021-07-14 – 2021-07-17 (×4): 6 via TOPICAL

## 2021-07-14 MED ORDER — PREDNISONE 10 MG PO TABS: 40.0000 mg | ORAL_TABLET | Freq: Every day | ORAL | Status: DC

## 2021-07-14 MED ORDER — ONDANSETRON HCL 4 MG/2ML IJ SOLN: 4.0000 mg | Freq: Four times a day (QID) | INTRAMUSCULAR | Status: DC | PRN

## 2021-07-14 MED ORDER — FAMOTIDINE 20 MG PO TABS: 20.0000 mg | ORAL_TABLET | Freq: Two times a day (BID) | ORAL | Status: DC

## 2021-07-14 MED ORDER — SERTRALINE HCL 50 MG PO TABS: 50.0000 mg | ORAL_TABLET | Freq: Every day | ORAL | Status: DC

## 2021-07-14 MED ORDER — FUROSEMIDE 10 MG/ML IJ SOLN
40.0000 mg | Freq: Once | INTRAMUSCULAR | Status: AC
  Administered 2021-07-14: 40 mg via INTRAVENOUS
  Filled 2021-07-14: qty 4

## 2021-07-14 MED ORDER — LACTATED RINGERS IV BOLUS (SEPSIS)
1000.0000 mL | Freq: Once | INTRAVENOUS | Status: AC
  Administered 2021-07-14: 1000 mL via INTRAVENOUS

## 2021-07-14 MED ORDER — LACTATED RINGERS IV SOLN: INTRAVENOUS | Status: DC

## 2021-07-14 MED ORDER — METHYLPREDNISOLONE SODIUM SUCC 125 MG IJ SOLR
125.0000 mg | Freq: Once | INTRAMUSCULAR | Status: AC
  Administered 2021-07-14: 125 mg via INTRAVENOUS
  Filled 2021-07-14: qty 2

## 2021-07-14 MED ORDER — SODIUM CHLORIDE 0.9 % IV SOLN: 500.0000 mg | INTRAVENOUS | Status: DC

## 2021-07-14 MED ORDER — LORAZEPAM 2 MG/ML IJ SOLN
1.0000 mg | Freq: Once | INTRAMUSCULAR | Status: AC
  Administered 2021-07-14: 1 mg via INTRAVENOUS
  Filled 2021-07-14: qty 1

## 2021-07-14 MED ORDER — IPRATROPIUM-ALBUTEROL 0.5-2.5 (3) MG/3ML IN SOLN
3.0000 mL | Freq: Four times a day (QID) | RESPIRATORY_TRACT | Status: DC
  Administered 2021-07-14 – 2021-07-15 (×6): 3 mL via RESPIRATORY_TRACT
  Filled 2021-07-14 (×6): qty 3

## 2021-07-14 MED ORDER — DEXMEDETOMIDINE HCL IN NACL 400 MCG/100ML IV SOLN
0.4000 ug/kg/h | INTRAVENOUS | Status: DC
  Administered 2021-07-14: 0.4 ug/kg/h via INTRAVENOUS
  Administered 2021-07-14: 0.802 ug/kg/h via INTRAVENOUS
  Administered 2021-07-14 – 2021-07-15 (×2): 0.9 ug/kg/h via INTRAVENOUS
  Administered 2021-07-15 (×2): 0.4 ug/kg/h via INTRAVENOUS
  Administered 2021-07-15: 1 ug/kg/h via INTRAVENOUS
  Administered 2021-07-16: 0.8 ug/kg/h via INTRAVENOUS
  Administered 2021-07-16: 1.1 ug/kg/h via INTRAVENOUS
  Administered 2021-07-16: 0.5 ug/kg/h via INTRAVENOUS
  Administered 2021-07-16: 0.7 ug/kg/h via INTRAVENOUS
  Administered 2021-07-17: 0.6 ug/kg/h via INTRAVENOUS
  Administered 2021-07-17 (×2): 0.7 ug/kg/h via INTRAVENOUS
  Administered 2021-07-17: 0.8 ug/kg/h via INTRAVENOUS
  Administered 2021-07-18: 0.802 ug/kg/h via INTRAVENOUS
  Administered 2021-07-18 – 2021-07-19 (×5): 0.8 ug/kg/h via INTRAVENOUS
  Filled 2021-07-14 (×19): qty 100

## 2021-07-14 MED ORDER — SODIUM CHLORIDE 0.9 % IV SOLN
500.0000 mg | INTRAVENOUS | Status: DC
  Administered 2021-07-15 – 2021-07-17 (×3): 500 mg via INTRAVENOUS
  Filled 2021-07-14: qty 500
  Filled 2021-07-14: qty 5
  Filled 2021-07-14: qty 500

## 2021-07-14 MED ORDER — DILTIAZEM HCL 25 MG/5ML IV SOLN
10.0000 mg | Freq: Once | INTRAVENOUS | Status: AC
  Administered 2021-07-14: 10 mg via INTRAVENOUS
  Filled 2021-07-14: qty 5

## 2021-07-14 MED ORDER — SODIUM CHLORIDE 0.9 % IV SOLN
2.0000 g | INTRAVENOUS | Status: DC
  Administered 2021-07-15 – 2021-07-17 (×3): 2 g via INTRAVENOUS
  Filled 2021-07-14 (×2): qty 2
  Filled 2021-07-14: qty 20

## 2021-07-14 MED ORDER — ACETAMINOPHEN 650 MG RE SUPP: 650.0000 mg | Freq: Four times a day (QID) | RECTAL | Status: DC | PRN

## 2021-07-14 MED ORDER — DIGOXIN 0.25 MG/ML IJ SOLN
0.2500 mg | Freq: Once | INTRAMUSCULAR | Status: AC
Start: 2021-07-14 — End: 2021-07-14
  Administered 2021-07-14: 0.25 mg via INTRAVENOUS
  Filled 2021-07-14: qty 2

## 2021-07-14 MED ORDER — ONDANSETRON HCL 4 MG PO TABS: 4.0000 mg | ORAL_TABLET | Freq: Four times a day (QID) | ORAL | Status: DC | PRN

## 2021-07-14 MED ORDER — MORPHINE SULFATE (PF) 2 MG/ML IV SOLN
2.0000 mg | Freq: Once | INTRAVENOUS | Status: AC
  Administered 2021-07-14: 2 mg via INTRAVENOUS
  Filled 2021-07-14: qty 1

## 2021-07-14 MED ORDER — INSULIN ASPART 100 UNIT/ML IJ SOLN
0.0000 [IU] | Freq: Three times a day (TID) | INTRAMUSCULAR | Status: DC
  Administered 2021-07-14: 5 [IU] via SUBCUTANEOUS
  Administered 2021-07-14: 3 [IU] via SUBCUTANEOUS
  Administered 2021-07-14: 5 [IU] via SUBCUTANEOUS
  Administered 2021-07-15: 3 [IU] via SUBCUTANEOUS
  Administered 2021-07-15: 2 [IU] via SUBCUTANEOUS
  Administered 2021-07-15: 3 [IU] via SUBCUTANEOUS
  Administered 2021-07-16 (×2): 2 [IU] via SUBCUTANEOUS
  Filled 2021-07-14 (×8): qty 1

## 2021-07-14 MED ORDER — SODIUM CHLORIDE 0.9 % IV SOLN
2.0000 g | INTRAVENOUS | Status: DC
  Administered 2021-07-14: 2 g via INTRAVENOUS
  Filled 2021-07-14: qty 20

## 2021-07-14 MED ORDER — SODIUM CHLORIDE 0.9 % IV SOLN: 2.0000 g | INTRAVENOUS | Status: DC

## 2021-07-14 MED ORDER — SODIUM CHLORIDE 0.9 % IV SOLN
500.0000 mg | INTRAVENOUS | Status: DC
  Administered 2021-07-14: 500 mg via INTRAVENOUS
  Filled 2021-07-14: qty 5

## 2021-07-14 MED ORDER — INSULIN ASPART 100 UNIT/ML IJ SOLN: 0.0000 [IU] | Freq: Every day | INTRAMUSCULAR | Status: DC

## 2021-07-14 MED ORDER — DILTIAZEM HCL 30 MG PO TABS
30.0000 mg | ORAL_TABLET | Freq: Four times a day (QID) | ORAL | Status: DC
  Administered 2021-07-14: 30 mg via ORAL
  Filled 2021-07-14 (×2): qty 1

## 2021-07-14 NOTE — Assessment & Plan Note (Addendum)
Last echo November 2020 with EF 55 to 60%.  No further diuresis once she was made comfort care
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
## 2021-07-14 NOTE — ED Notes (Signed)
Sitter at bedside at this time. Breakfast tray given and pt repositioned in the bed.

## 2021-07-14 NOTE — Assessment & Plan Note (Addendum)
Previously on nebulizers and steroids.  Steroids discontinued after she was made comfort care.

## 2021-07-14 NOTE — Assessment & Plan Note (Addendum)
Hyperglycemia exacerbated by steroid use --not on insulin at home Sliding scale insulin initially, discontinued after made comfort care

## 2021-07-14 NOTE — Assessment & Plan Note (Signed)
Patient reportedly confused and agitated with EMS, subsequently improving with treatment in the ED Neurochecks and delirium precautions

## 2021-07-14 NOTE — Assessment & Plan Note (Addendum)
Not currently on statin or ASA per home med

## 2021-07-14 NOTE — Progress Notes (Signed)
CODE SEPSIS - PHARMACY COMMUNICATION  **Broad Spectrum Antibiotics should be administered within 1 hour of Sepsis diagnosis**  Time Code Sepsis Called/Page Received: 5/29 @ 0109  Antibiotics Ordered: Ceftriaxone , Azithromycin   Time of 1st antibiotic administration: Ceftriaxone 2 gm IV X 1 on 5/29 @ 0128   Additional action taken by pharmacy:   If necessary, Name of Provider/Nurse Contacted:     Marine Lezotte D ,PharmD Clinical Pharmacist  07/12/2021  5:36 AM

## 2021-07-14 NOTE — ED Notes (Signed)
Pt HR currently ranging 120s-130s A fib RVR. Unable to titrate pt's dilt drip at this time. Pt current BP 106/62. Messaged Dr. Billie Ruddy, MD attending physician at this time, awaiting reply. Will continue to monitor

## 2021-07-14 NOTE — Assessment & Plan Note (Addendum)
--  per pt and family, this was chronic and due to ventral hernia.  No abdominal pain.  KUB benign.

## 2021-07-14 NOTE — Assessment & Plan Note (Addendum)
Just cardiology had been following.  Digoxin x1.  IV heparin and Cardizem drip.  Heparin discontinued once patient was made comfort care.  Cardizem drip changed to as needed Lopressor.

## 2021-07-14 NOTE — Assessment & Plan Note (Addendum)
Chest x-ray with mild right basilar opacity favored to reflect atelectasis and small right pleural effusion.  Initially placed on empiric antibiotics.  These will likely be discontinued when she is comfort care.  Procalcitonin negative

## 2021-07-14 NOTE — Plan of Care (Signed)
GOC consult noted. Staff is in room with patient and family has stepped out for a phone call. Will reattempt tomorrow.

## 2021-07-14 NOTE — ED Notes (Addendum)
Dr. Billie Ruddy, MD at bedside at this time.

## 2021-07-14 NOTE — Progress Notes (Signed)
OT Cancellation Note  Patient Details Name: Summer Bradley MRN: [id number] DOB: [birth year]   Cancelled Treatment:     OT orders received, chart reviewed. Pt noted to have elevated resting HR (138bpm) and RR (34) this AM. OT intervention not appropriate at this time (exertional activity contraindicated 2/2 elevated resting HR). Will f/u as pt is medically stable.   Sharilyn Sites Bryar Rennie 06/25/2021, 8:37 AM

## 2021-07-14 NOTE — ED Notes (Signed)
Cardiology team at bedside at this time.

## 2021-07-14 NOTE — ED Triage Notes (Signed)
Pt pushed medical alarm with complaints of shortness of breath. Pt on 4L o2 at baseline. Sating well but confused.

## 2021-07-14 NOTE — Assessment & Plan Note (Addendum)
Hemoglobin 9.5, down from baseline of 10-11 in the setting of chronic anticoagulation.  Labs stopped after she was made comfort care.

## 2021-07-14 NOTE — Assessment & Plan Note (Addendum)
Borderline low blood pressure

## 2021-07-14 NOTE — Assessment & Plan Note (Addendum)
Sepsis criteria include tachycardia to 137, tachypnea to 30, leukocytosis of 14,000, lactic acid pending Continue to treat suspected pneumonia --d/c IVF

## 2021-07-14 NOTE — ED Notes (Signed)
Per Dr. Billie Ruddy, await Cardiology consult in regards to pt's HR.

## 2021-07-14 NOTE — Progress Notes (Signed)
Patient accepted from ED to room 18.  Upon arrival patient anxious, restless, noted to have increased work of breathing, tac hypnic.  Precedex drip increased to 53mg.  Cardizem increased to '15mg'$  due to uncontrolled heart rate.  Dr. LBillie Ruddycalled and notified about respiratory distress and changes made to increase comfort in patient.  Patient verbalized that she would not want BiPAP or breathing tube if her breathing got any worse.  MD placed or for palliative care consult.

## 2021-07-14 NOTE — ED Notes (Addendum)
Pt is increasingly agitated. Becoming more tachypneic and stating she cannot breath although O2 saturation is 95% on chronic O2. Pt is not being cooperative and hard to redirect. Pt continuously screaming "help". Dr. Billie Ruddy notified. Per Dr. Billie Ruddy, pt refused Bipap

## 2021-07-14 NOTE — ED Notes (Signed)
Pt randomly yelling and screaming, restless, and stating she is anxious. Pt is able to answer questions when asked. Sitter remains at bedside. Pt has a hx of anxiety. Dr. Billie Ruddy notified, see new orders

## 2021-07-14 NOTE — ED Notes (Signed)
Sitter remains at the bedside

## 2021-07-14 NOTE — Assessment & Plan Note (Addendum)
Chronic 4L O2 at abseline Felt to be multifactorial secondary to COPD exacerbation and atrial fibrillation with RVR.  Patient already DNR.  She has been refusing BiPAP.  She has had many similar hospitalizations in the past was more amenable to BiPAP.  At this time, due to agitation, requiring Precedex.  Family in discussion with palliative care and are not wanting her to suffer.  Sepsis has been ruled out.  Procalcitonin level is normal.  Lactic acid level likely secondary to aggressive albuterol treatment.  Patient was made comfort care on 6/1.  Antibiotics and steroids discontinued.  She was provided supportive treatment and passed away on 08-08-22.  Family present.

## 2021-07-14 NOTE — ED Provider Notes (Signed)
[facility] Provider Note    Event Date/Time   First MD Initiated Contact with Patient 07/07/2021 0018     (approximate)   History   Shortness of Breath   HPI  Summer Bradley is a 76 y.o. female who presents to the ED for evaluation of Shortness of Breath   I reviewed medical DC summary from January.  Patient was admitted for a week due to acute on chronic hypoxic respiratory failure attributed to COPD and diastolic CHF.  Also a history of right MCA stroke, pulmonary hypertension, atrial fibrillation.  Anticoagulated on Eliquis.  Patient presents to the ED for evaluation of shortness of breath.  She lives by herself and apparently pressed her life alert necklace earlier this evening.  EMS reports finding her confused and agitated and repeatedly saying that she cannot breathe. She then was trying to refuse transport but EMS felt that she was too altered to have capacity to make this judgment so they brought her to the ED.  Here, she is agitated and ultimately is redirectable with reassurance.  She says that her breathing has "always" been bad but significantly worsening just in the past 1-2 hours.  Denies any falls or syncope.  Physical Exam   Triage Vital Signs: ED Triage Vitals  Enc Vitals Group     BP --      Pulse Rate 06/25/2021 0015 (!) 165     Resp --      Temp 06/22/2021 0015 98.5 F (36.9 C)     Temp Source 06/24/2021 0015 Oral     SpO2 07/03/2021 0014 97 %     Weight 07/03/2021 0015 187 lb (84.8 kg)     Height 06/20/2021 0015 '5\' 5"'$  (1.651 m)     Head Circumference --      Peak Flow --      Pain Score --      Pain Loc --      Pain Edu? --      Excl. in [place]? --     Most recent vital signs: Vitals:   06/28/2021 0130 07/16/2021 0200  BP: 106/68 103/65  Pulse: (!) 106 (!) 122  Resp: (!) 38 (!) 34  Temp:    SpO2: 96% 100%    General: Awake.  Lying on her right side, agitated and repeatedly asking for help.  Is redirectable.  Follows commands in  all 4 extremities with reassurance. CV:  Good peripheral perfusion.  Tachycardic Resp:  Tachypneic to nearly 40.  Poor breath sounds throughout with faint expiratory wheezes.  Very poor airflow. Abd:  No distention.  Soft and benign MSK:  No deformity noted.  Trace pitting edema to bilateral lower extremities Neuro:  No focal deficits appreciated. Other:     ED Results / Procedures / Treatments   Labs (all labs ordered are listed, but only abnormal results are displayed) Labs Reviewed  COMPREHENSIVE METABOLIC PANEL - Abnormal; Notable for the following components:      Result Value   Glucose, Bld 114 (*)    Total Bilirubin 1.5 (*)    All other components within normal limits  CBC WITH DIFFERENTIAL/PLATELET - Abnormal; Notable for the following components:   WBC 14.4 (*)    Hemoglobin 9.5 (*)    HCT 34.4 (*)    MCV 71.8 (*)    MCH 19.8 (*)    MCHC 27.6 (*)    RDW 20.6 (*)    Platelets 444 (*)    nRBC  0.6 (*)    Neutro Abs 10.3 (*)    Monocytes Absolute 1.2 (*)    All other components within normal limits  BLOOD GAS, ARTERIAL - Abnormal; Notable for the following components:   pO2, Arterial 62 (*)    All other components within normal limits  SARS CORONAVIRUS 2 BY RT PCR  CULTURE, BLOOD (SINGLE)  BRAIN NATRIURETIC PEPTIDE  TROPONIN I (HIGH SENSITIVITY)  TROPONIN I (HIGH SENSITIVITY)    EKG Poor quality EKG seems to demonstrate A-fib with RVR with a rate of 155 bpm.  Normal axis and apparently normal intervals.  No clear signs of acute ischemia.  RADIOLOGY 1view CXR interpreted by me with right basilar infiltrate  Official radiology report(s): DG Chest Portable 1 View  Result Date: 07/08/2021 CLINICAL DATA:  Shortness of breath EXAM: PORTABLE CHEST 1 VIEW COMPARISON:  02/26/2021 FINDINGS: Mild right basilar opacity, favored to reflect a combination of atelectasis and small right pleural effusion. Hazy opacity overlying the left hemithorax favors soft tissue rather than  a pulmonary opacity. The heart is top-normal in size.  Thoracic aortic atherosclerosis. Degenerative changes of the right shoulder. IMPRESSION: Mild right basilar opacity, favored to reflect a combination of atelectasis and small right pleural effusion. Electronically Signed   By: Julian Hy M.D.   On: 07/09/2021 01:01    PROCEDURES and INTERVENTIONS:  .1-3 Lead EKG Interpretation Performed by: Vladimir Crofts, MD Authorized by: Vladimir Crofts, MD     Interpretation: abnormal     ECG rate:  124   ECG rate assessment: tachycardic     Rhythm: atrial fibrillation     Ectopy: PVCs     Conduction: normal   .Critical Care Performed by: Vladimir Crofts, MD Authorized by: Vladimir Crofts, MD   Critical care provider statement:    Critical care time (minutes):  30   Critical care time was exclusive of:  Separately billable procedures and treating other patients   Critical care was necessary to treat or prevent imminent or life-threatening deterioration of the following conditions:  Sepsis   Critical care was time spent personally by me on the following activities:  Development of treatment plan with patient or surrogate, discussions with consultants, evaluation of patient's response to treatment, examination of patient, ordering and review of laboratory studies, ordering and review of radiographic studies, ordering and performing treatments and interventions, pulse oximetry, re-evaluation of patient's condition and review of old charts  Medications  diltiazem (CARDIZEM) 125 mg in dextrose 5% 125 mL (1 mg/mL) infusion (7.5 mg/hr Intravenous Rate/Dose Change 06/23/2021 0203)  lactated ringers infusion (has no administration in time range)  lactated ringers bolus 1,000 mL (1,000 mLs Intravenous New Bag/Given 06/25/2021 0130)  cefTRIAXone (ROCEPHIN) 2 g in sodium chloride 0.9 % 100 mL IVPB (0 g Intravenous Stopped 07/10/2021 0201)  azithromycin (ZITHROMAX) 500 mg in sodium chloride 0.9 % 250 mL IVPB (500 mg  Intravenous New Bag/Given 07/12/2021 0201)  ipratropium-albuterol (DUONEB) 0.5-2.5 (3) MG/3ML nebulizer solution 6 mL (6 mLs Nebulization Given 06/27/2021 0044)  methylPREDNISolone sodium succinate (SOLU-MEDROL) 125 mg/2 mL injection 125 mg (125 mg Intravenous Given 07/15/2021 0048)  diltiazem (CARDIZEM) injection 10 mg (10 mg Intravenous Given 06/17/2021 0057)     IMPRESSION / MDM / [place] / ED COURSE  I reviewed the triage vital signs and the nursing notes.  Differential diagnosis includes, but is not limited to, COPD exacerbation, sepsis, PNA, hypercarbia, ACS  {Patient presents with symptoms of an acute illness or injury that is potentially life-threatening.  76 year old female presents to the ED short of breath with evidence of sepsis from pneumonia and associated COPD exacerbation requiring medical admission.  She is tachycardic in A-fib with RVR requiring diltiazem bolus and drip.  She maintains good pressures without evidence of shock.  Requiring increased nasal cannula oxygenation but no indications for BiPAP as she begins to improve well with breathing treatments.  Blood work with leukocytosis but reassuring metabolic panel.  Troponin is negative and her ABG is similarly reassuring without evidence of acidosis or hypercarbia.  CXR with right basilar infiltrate and she is meeting SIRS/sepsis criteria and so CAP coverage provided.  I consulted with medicine who agrees to admit.  Clinical Course as of 06/24/2021 0207  Mon Jul 14, 2021  0030 Reassessed. [DS]  0109 ProvedReassessed.  Mains tachycardic.  Respiratory rate is slowing somewhat.  She still needs help breathing.  We discussed need for medical admission and she is agreeable. [DS]  0204 I consult with hospitalist, Dr. Damita Dunnings who agrees to admit. [DS]    Clinical Course User Index [DS] Vladimir Crofts, MD     FINAL CLINICAL IMPRESSION(S) / ED DIAGNOSES   Final diagnoses:  Atrial fibrillation with RVR ([place])  COPD exacerbation  ([place])  Community acquired pneumonia of right lower lobe of lung  Sepsis with acute hypoxic respiratory failure without septic shock, due to unspecified organism [facility])     Rx / DC Orders   ED Discharge Orders     None        Note:  This document was prepared using Dragon voice recognition software and may include unintentional dictation errors.   Vladimir Crofts, MD 07/10/2021 [phone]

## 2021-07-14 NOTE — Progress Notes (Signed)
Pt being followed by [facility] for Sepsis protocol. 

## 2021-07-14 NOTE — Assessment & Plan Note (Addendum)
--   Had chronic anxiety and current Rx for ativan -Was having severe anxiety with subjective feeling of not being able to breath (even while sating in high 90's).  IV Ativan, IV haldol and IV morphine did not help, so changed to Precedex drip.  She was currently tolerating Precedex drip which is preventing agitation, but she was still able to interact somewhat with her daughters.  Occasional as needed Ativan needed.

## 2021-07-14 NOTE — Progress Notes (Signed)
  Progress Note   Patient: Summer Bradley OMV:[id number] DOB: 1945-05-25 DOA: 06/21/2021     0 DOS: the patient was seen and examined on 07/11/2021   Brief hospital course: No notes on file  Assessment and Plan: * Acute on chronic respiratory failure with hypoxemia (HCC) Chronic 4L O2 at abseline Suspect multifactorial related to COPD exacerbation, possible pneumonia and impart some component of CHF given small pleural effusion --persistent dyspnea associated with severe anxiety --No intubation and currently also refusing BiPAP Plan: --Continue supplemental O2 to keep sats between 88-92% --palliative care consult   Sepsis ([place]) Sepsis criteria include tachycardia to 137, tachypnea to 30, leukocytosis of 14,000, lactic acid pending Continue to treat suspected pneumonia --d/c IVF  Severe anxiety --has chronic anxiety and current Rx for ativan --currently having severe anxiety with subjective feeling of not being able to breath (even while sating in high 90's).  IV Ativan, IV haldol and IV morphine did not help Plan: --start precedex gtt   Atrial fibrillation with rapid ventricular response (HCC) Likely driven by acute respiratory illness --started on dilt gtt Plan: --cardiology consult --cont dilt gtt --cont Eliquis  CAP (community acquired pneumonia) Chest x-ray with mild right basilar opacity favored to reflect atelectasis and small right pleural effusion --procal neg --started on empiric Rocephin and azithromycin Plan: --cont empiric ceftriaxone and azithromycin for now  Chronic heart failure with preserved ejection fraction (HFpEF) ([place]) Last echo November 2020 with EF 55 to 60% Plan: --IV lasix 40 x1 today --Echo   COPD with acute exacerbation ([place]) --cont IV solumedrol --cont scheduled nebs --cont azithromycin  Abdominal distension --per pt and family, this is chronic and due to ventral hernia.  No abdominal pain.  Anemia Hemoglobin 9.5, down from  baseline of 10-11 in the setting of chronic anticoagulation Continue to monitor hemoglobin Follow anemia panel  Diabetes mellitus without complication (HCC) Hyperglycemia exacerbated by steroid use --not on insulin at home --ACHS and SSI   History of CVA (cerebrovascular accident) Not currently on statin or ASA per home med  Essential hypertension Borderline low blood pressure  Acute metabolic encephalopathy, ruled out      Subjective:  Pt has been very anxious and despite IV ativan, IV haldol and IV morphine, pt remained anxious and getting very worked up, which exacerbated her respiratory distress.  Pt was started on precedex gtt.    Daughter Jackelyn Poling called to update her on pt's condition.    Pt has been refusing BiPAP which is a change now.  Palliative care consulted.   Physical Exam:  Constitutional: in moderate distress, AAOx3 HEENT: conjunctivae and lids normal, EOMI CV: No cyanosis.   RESP: increased RR, on 5L sating 95% GI: abdomen large but soft SKIN: warm, dry Neuro: II - XII grossly intact.   Psych: anxious mood and affect.     Data Reviewed:  Family Communication: daughter Jackelyn Poling updated on the phone today  Disposition: Status is: Inpatient   Planned Discharge Destination: Home    Time spent: 50 minutes Billed for extended service  Author: Enzo Bi, MD 06/29/2021 3:53 PM  For on call review [email or web address].   Extended service.
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
## 2021-07-14 NOTE — Progress Notes (Signed)
PT Cancellation Note  Patient Details Name: [name] MRN: [id number] DOB: 02-May-1945   Cancelled Treatment:    Reason Eval/Treat Not Completed: Medical issues which prohibited therapy: HR 138, RR 34, hold PT services this date per nursing.  Will attempt to see pt at a future date/time as medically appropriate.     Linus Salmons PT, DPT 07/09/2021, 8:36 AM

## 2021-07-14 NOTE — Progress Notes (Signed)
Unable to obtain oral or Aux temp, check rectal Tem is 94.5, place warming blanket on her for now and she will not keep it on.  We will attempt bear hugger on. She is choking on  mouth swab and ice chip. Notified NP that  I am keeping her NPO for now. Requested for her PO Cardizem  be changed to IV. Plan is to hold her PO  med for now and request for speech therapy swallow evaluation.    Daughter stated that pt have being having problem with dysphasia do to her multiple CVA in the past.

## 2021-07-15 ENCOUNTER — Inpatient Hospital Stay (HOSPITAL_COMMUNITY)
Admit: 2021-07-15 | Discharge: 2021-07-15 | Disposition: A | Discharge status: Home / Self Care | Payer: Medicare (Managed Care) | Attending: Hospitalist | Admitting: Hospitalist

## 2021-07-15 DIAGNOSIS — J441 Chronic obstructive pulmonary disease with (acute) exacerbation: Secondary | ICD-10-CM

## 2021-07-15 DIAGNOSIS — I4891 Unspecified atrial fibrillation: Secondary | ICD-10-CM | POA: Diagnosis not present

## 2021-07-15 DIAGNOSIS — Z7189 Other specified counseling: Secondary | ICD-10-CM | POA: Diagnosis not present

## 2021-07-15 DIAGNOSIS — R0603 Acute respiratory distress: Secondary | ICD-10-CM | POA: Diagnosis not present

## 2021-07-15 DIAGNOSIS — J9621 Acute and chronic respiratory failure with hypoxia: Secondary | ICD-10-CM | POA: Diagnosis not present

## 2021-07-15 DIAGNOSIS — G9341 Metabolic encephalopathy: Secondary | ICD-10-CM | POA: Diagnosis present

## 2021-07-15 LAB — ECHOCARDIOGRAM COMPLETE
Area-P 1/2: 5.44 cm2
Calc EF: 53.3 %
Height: 65 in
MV M vel: 4.63 m/s
MV Peak grad: 85.7 mmHg
Radius: 0.5 cm
S' Lateral: 3.6 cm
Single Plane A2C EF: 59.2 %
Single Plane A4C EF: 46.3 %
Weight: 2992 oz

## 2021-07-15 LAB — BLOOD GAS, ARTERIAL
Acid-Base Excess: 1.7 mmol/L (ref 0.0–2.0)
Bicarbonate: 26.6 mmol/L (ref 20.0–28.0)
O2 Content: 5 L/min
O2 Saturation: 92.4 %
Patient temperature: 37
pCO2 arterial: 42 mmHg (ref 32–48)
pH, Arterial: 7.41 (ref 7.35–7.45)
pO2, Arterial: 62 mmHg — ABNORMAL LOW (ref 83–108)

## 2021-07-15 LAB — GLUCOSE, CAPILLARY
Glucose-Capillary: 220 mg/dL — ABNORMAL HIGH (ref 70–99)
Glucose-Capillary: 163 mg/dL — ABNORMAL HIGH (ref 70–99)
Glucose-Capillary: 169 mg/dL — ABNORMAL HIGH (ref 70–99)
Glucose-Capillary: 131 mg/dL — ABNORMAL HIGH (ref 70–99)
Glucose-Capillary: 129 mg/dL — ABNORMAL HIGH (ref 70–99)

## 2021-07-15 LAB — BASIC METABOLIC PANEL
Anion gap: 13 (ref 5–15)
BUN: 28 mg/dL — ABNORMAL HIGH (ref 8–23)
CO2: 22 mmol/L (ref 22–32)
Calcium: 9.3 mg/dL (ref 8.9–10.3)
Chloride: 106 mmol/L (ref 98–111)
Creatinine, Ser: 0.84 mg/dL (ref 0.44–1.00)
GFR, Estimated: >60 mL/min (ref >60)
Glucose, Bld: 151 mg/dL — ABNORMAL HIGH (ref 70–99)
Potassium: 5.3 mmol/L — ABNORMAL HIGH (ref 3.5–5.1)
Sodium: 141 mmol/L (ref 135–145)

## 2021-07-15 LAB — CBC
HCT: 38.6 % (ref 36.0–46.0)
Hemoglobin: 10.3 g/dL — ABNORMAL LOW (ref 12.0–15.0)
MCH: 19.6 pg — ABNORMAL LOW (ref 26.0–34.0)
MCHC: 26.7 g/dL — ABNORMAL LOW (ref 30.0–36.0)
MCV: 73.5 fL — ABNORMAL LOW (ref 80.0–100.0)
Platelets: 249 10*3/uL (ref 150–400)
RBC: 5.25 MIL/uL — ABNORMAL HIGH (ref 3.87–5.11)
RDW: 21.3 % — ABNORMAL HIGH (ref 11.5–15.5)
WBC: 13 10*3/uL — ABNORMAL HIGH (ref 4.0–10.5)
nRBC: 2 % — ABNORMAL HIGH (ref 0.0–0.2)

## 2021-07-15 LAB — MAGNESIUM: Magnesium: 2.3 mg/dL (ref 1.7–2.4)

## 2021-07-15 MED ORDER — NYSTATIN 100000 UNIT/GM EX POWD
Freq: Two times a day (BID) | CUTANEOUS | Status: DC
Start: 2021-07-15 — End: 2021-07-17
  Filled 2021-07-15: qty 15

## 2021-07-15 MED ORDER — FUROSEMIDE 10 MG/ML IJ SOLN
40.0000 mg | Freq: Once | INTRAMUSCULAR | Status: AC
  Administered 2021-07-15: 40 mg via INTRAVENOUS
  Filled 2021-07-15: qty 4

## 2021-07-15 MED ORDER — METHYLPREDNISOLONE SODIUM SUCC 40 MG IJ SOLR
40.0000 mg | Freq: Two times a day (BID) | INTRAMUSCULAR | Status: DC
  Administered 2021-07-15 – 2021-07-16 (×4): 40 mg via INTRAVENOUS
  Filled 2021-07-15 (×4): qty 1

## 2021-07-15 MED ORDER — MORPHINE SULFATE (PF) 2 MG/ML IV SOLN
2.0000 mg | INTRAVENOUS | Status: DC | PRN
  Administered 2021-07-15 – 2021-07-19 (×11): 2 mg via INTRAVENOUS
  Filled 2021-07-15 (×11): qty 1

## 2021-07-15 MED ORDER — LEVALBUTEROL HCL 0.63 MG/3ML IN NEBU
0.6300 mg | INHALATION_SOLUTION | Freq: Four times a day (QID) | RESPIRATORY_TRACT | Status: DC
  Administered 2021-07-15 – 2021-07-17 (×6): 0.63 mg via RESPIRATORY_TRACT
  Filled 2021-07-15 (×6): qty 3

## 2021-07-15 MED ORDER — HEPARIN BOLUS VIA INFUSION
3500.0000 [IU] | Freq: Once | INTRAVENOUS | Status: AC
  Administered 2021-07-15: 3500 [IU] via INTRAVENOUS
  Filled 2021-07-15: qty 3500

## 2021-07-15 MED ORDER — PERFLUTREN LIPID MICROSPHERE
1.0000 mL | INTRAVENOUS | Status: AC | PRN
  Administered 2021-07-15: 2 mL via INTRAVENOUS

## 2021-07-15 MED ORDER — LORAZEPAM 2 MG/ML IJ SOLN
1.0000 mg | Freq: Three times a day (TID) | INTRAMUSCULAR | Status: DC | PRN
Start: 2021-07-15 — End: 2021-07-18
  Administered 2021-07-15 – 2021-07-18 (×9): 1 mg via INTRAVENOUS
  Filled 2021-07-15 (×9): qty 1

## 2021-07-15 MED ORDER — IPRATROPIUM BROMIDE 0.02 % IN SOLN
0.5000 mg | Freq: Four times a day (QID) | RESPIRATORY_TRACT | Status: DC
Start: 2021-07-15 — End: 2021-07-17
  Administered 2021-07-15 – 2021-07-17 (×6): 0.5 mg via RESPIRATORY_TRACT
  Filled 2021-07-15 (×6): qty 2.5

## 2021-07-15 MED ORDER — HEPARIN (PORCINE) 25000 UT/250ML-% IV SOLN
1200.0000 [IU]/h | INTRAVENOUS | Status: DC
Start: 2021-07-15 — End: 2021-07-16
  Administered 2021-07-15: 800 [IU]/h via INTRAVENOUS
  Administered 2021-07-16: 1200 [IU]/h via INTRAVENOUS
  Filled 2021-07-15 (×2): qty 250

## 2021-07-15 NOTE — Progress Notes (Addendum)
SLP Cancellation Note  Patient Details Name: Summer Bradley MRN: [id number] DOB: 08-23-1945   Cancelled treatment:       Reason Eval/Treat Not Completed: Patient not medically ready;Medical issues which prohibited therapy;Fatigue/lethargy limiting ability to participate (chart reviewed; consulted NSG then met w/ pt/family in room) Per chart review and discussion w/ NSG, pt currently requires Precedex d/t agitation and SOB. Pt appears calm, lethargic arousing to mumble 1-2 words and raise her hand in the air while in the room.  Dtr endorsed concern of swallowing but stated pt was eating/drinking a "Regular" diet w/ thin liquids at home -- "Daughter let nursing staff know that she had been having issues at home with swallowing since she had had several strokes in the past.".  D/t pt's Baseline dysphagia, CVAs, and current lethargic presentation, ST services recommends Holding on BSE until pt can be weaned Fully from sedating medications as such can significantly impact swallowing and increase risk for aspiration thus Pulmonary decline.   Encouraged continued use of Oral Swabs for oral hygiene and stimulation of swallowing -- recommended squeezing out water of the sponge swabs d/t report of pt "coughing" w/ swabs by Sitter. Dtr and NSG updated; Sitter present in room updated also.  Recommended Humidification of [place] O2 to NSG to help w/ dry mouth/throat.   ST services will continue to follow pt's status and perform BSE when pt has weaned from sedating medications. Noted Palliative Care consult order in chart.       Orinda Kenner, MS, CCC-SLP Speech Language Pathologist Rehab Services; [place] [phone] (ascom) Orinda Kenner, MS, CCC-SLP Speech Language Pathologist Rehab Services; [place] [phone] (ascom) Jevante Hollibaugh 07/15/2021, 10:11 AM

## 2021-07-15 NOTE — Assessment & Plan Note (Signed)
Secondary to albuterol.  Sepsis ruled out.

## 2021-07-15 NOTE — Hospital Course (Addendum)
76 year old female with past medical history of diabetes, hypertension, COPD with chronic respiratory failure on 4 L, chronic systolic heart failure and atrial fibrillation who presented to the emergency room on 5/29 with shortness of breath and confusion.  Patient found to have acute respiratory failure secondary to pneumonia as well as sepsis and started on IV fluids and antibiotics.  Patient also found to be in rapid atrial fibrillation and started on Cardizem drip.  Cardiology consulted.  Patient has been quite agitated requiring Precedex drip.  Patient has had previous hospitalizations before requiring BiPAP and with overall decline, family had meetings with palliative care and on 6/1, patient's daughters opted to make her comfort care. Patient passed away at 3:21 PM on July 21, 2022.

## 2021-07-15 NOTE — Assessment & Plan Note (Signed)
--  pt was reportedly found confused on presentation.  Likely due to hypoxia.

## 2021-07-15 NOTE — Progress Notes (Signed)
PT Cancellation Note  Patient Details Name: Summer Bradley MRN: [id number] DOB: 30-Dec-1945   Cancelled Treatment:    Reason Eval/Treat Not Completed: Other (comment): Hold PT services at this time per nursing secondary to SOB and agitation.  Will attempt to see pt at a future date/time as medically appropriate.     Linus Salmons PT, DPT 07/15/21, 8:52 AM

## 2021-07-15 NOTE — Progress Notes (Signed)
[facility] for heparin infusion initiation and monitoring Indication: atrial fibrillation  Allergies  Allergen Reactions   Lisinopril Cough   Amoxicillin     Patient Measurements: Height: '5\' 5"'$  (165.1 cm) Weight: 84.8 kg (187 lb) IBW/kg (Calculated) : 57 Heparin Dosing Weight: 75.3 kg  Vital Signs: Temp: 95.9 F (35.5 C) (05/30 1100) Temp Source: Axillary (05/30 1100) BP: 108/69 (05/30 0800) Pulse Rate: 62 (05/30 0800)  Labs: Recent Labs    07/01/2021 0037 06/17/2021 1636 07/15/21 0610  HGB 9.5*  --  10.3*  HCT 34.4*  --  38.6  PLT 444*  --  249  CREATININE 0.76  --  0.84  TROPONINIHS 11 10  --     Estimated Creatinine Clearance: 61.3 mL/min (by C-G formula based on SCr of 0.84 mg/dL).   Medical History: Past Medical History:  Diagnosis Date   A-fib (HCC)    Anxiety    COPD (chronic obstructive pulmonary disease) (HCC)    Cough    CHONIC   Depression    Dysrhythmia    GERD (gastroesophageal reflux disease)    Heart disease    High cholesterol    Hypertension    Hypothyroidism    Orthopnea    Oxygen dependent    2/L CONTINUOUSLY   Stroke (HCC)    Vertigo    Wheezing     Medications:  Scheduled:   Chlorhexidine Gluconate Cloth  6 each Topical Daily   famotidine  20 mg Oral BID   insulin aspart  0-15 Units Subcutaneous TID WC   insulin aspart  0-5 Units Subcutaneous QHS   ipratropium-albuterol  3 mL Nebulization Q6H   levothyroxine  200 mcg Oral Q0600   methylPREDNISolone (SOLU-MEDROL) injection  40 mg Intravenous Q12H   sertraline  50 mg Oral Daily    Assessment: 76 y.o. female w/ PMH of DM, HTN, CVA, A-fib on apixaban, HFpEF, COPD, chronic respiratory failure on home O2 at 4 L, who was brought to the ED by EMS with shortness of breath and confusion. Because she is not medically cleared for swallowing, apixaban (last dose 06/30/2021 1002)is being stopped and heparin is being started  Goal of Therapy:  Heparin  level 0.3-0.7 units/ml aPTT 66-102 seconds Monitor platelets by anticoagulation protocol: Yes   Plan:  Give 3500 units bolus x 1 Start heparin infusion at 800 units/hr (this rate is lower than protocol d/t lower requirements on a previous admission that mirrors this rate) Check aPTT level in 8 hours and daily while on heparin Continue to monitor H&H and platelets  Dallie Piles 07/15/2021,1:46 PM

## 2021-07-15 NOTE — Progress Notes (Signed)
Initial Nutrition Assessment  DOCUMENTATION CODES:   Obesity unspecified  INTERVENTION:   RD will add supplements once pt's diet is advanced.   NUTRITION DIAGNOSIS:   Inadequate oral intake related to acute illness as evidenced by NPO status.  GOAL:   Patient will meet greater than or equal to 90% of their needs  MONITOR:   Diet advancement, Labs, Weight trends, Skin, I & O's  REASON FOR ASSESSMENT:   Consult Assessment of nutrition requirement/status  ASSESSMENT:   76 y/o female with h/o DM, HTN, CVA, Afib, CHF, COPD and chronic dysphagia who is admitted with CAP and COPD exacerbation.  Met with pt in room today. Pt is difficult to understand therefore unable to obtain detailed nutrition related history. Per family report, pt eating regular foods prior to admission. Pt currently NPO pending SLP evaluation. Pt coughing when drinking water today. Per chart, pt appears weight stable at baseline. RD suspects pt with good oral intake at baseline. RD will add supplements once pt's diet is advanced.   Medications reviewed and include: pepcid, insulin, synthroid, solu-medrol, azithromycin, ceftriaxone, heparin   Labs reviewed: K 5.3(H), Mg 2.3 wnl Iron 21(L), TIBC 473(H), ferritin 17, folate 8.2 wnl, B12 302- 5/29 WBC- 13.0(H), Hgb 10.3(L) Cbgs- 169, 163 x 24 hrs AIC 6.7(H)- 5/29  NUTRITION - FOCUSED PHYSICAL EXAM:  Flowsheet Row Most Recent Value  Orbital Region No depletion  Upper Arm Region No depletion  Thoracic and Lumbar Region No depletion  Buccal Region No depletion  Temple Region No depletion  Clavicle Bone Region No depletion  Clavicle and Acromion Bone Region No depletion  Scapular Bone Region No depletion  Dorsal Hand No depletion  Patellar Region No depletion  Anterior Thigh Region No depletion  Posterior Calf Region No depletion  Edema (RD Assessment) Mild  Hair Reviewed  Eyes Reviewed  Mouth Reviewed  Skin Reviewed  Nails Reviewed   Diet Order:    Diet Order             Diet NPO time specified  Diet effective now                  EDUCATION NEEDS:   Education needs have been addressed  Skin:  Skin Assessment: Reviewed RN Assessment (ecchymosis)  Last BM:  5/29- type 4  Height:   Ht Readings from Last 1 Encounters:  06/23/2021 5' 5" (1.651 m)    Weight:   Wt Readings from Last 1 Encounters:  06/22/2021 84.8 kg    Ideal Body Weight:  56.8 kg  BMI:  Body mass index is 31.12 kg/m.  Estimated Nutritional Needs:   Kcal:  1700-1900kcal/day  Protein:  85-95g/day  Fluid:  1.4-1.6L/day  Koleen Distance MS, RD, LDN Please refer to [facility] for RD and/or RD on-call/weekend/after hours pager

## 2021-07-15 NOTE — Progress Notes (Signed)
OT Cancellation Note  Patient Details Name: Summer Bradley MRN: [id number] DOB: 1945-07-10   Cancelled Treatment:    Reason Eval/Treat Not Completed: Patient not medically ready. Consult received, chart reviewed. Hold OT services at this time per nursing secondary to SOB and agitation.  Will attempt to see pt at a future date/time as medically appropriate.  Ardeth Perfect., MPH, MS, OTR/L ascom [phone] 07/15/21, 9:18 AM

## 2021-07-15 NOTE — Progress Notes (Signed)
Progress Note  Patient Name: Summer Bradley Date of Encounter: 07/15/2021  [facility] Cardiologist: PACE  Subjective   Patient was seen on AM rounds. Denies any chest pain or worsening shortness of breath. Endorses being hot and fanning herself. Nursing reports she has been agitated overnight. Heart rates have been better ranging from 80-110. There was an issue with her getting choked on ice chips last evening so nursing made her NPO again until she could be evaluated by speech. Daughter let nursing staff know that she had been having issues at home with swallowing since she had had several strokes in the past. No AM medications have been given at this time.   Inpatient Medications    Scheduled Meds:  apixaban  5 mg Oral BID   Chlorhexidine Gluconate Cloth  6 each Topical Daily   diltiazem  30 mg Oral Q6H   famotidine  20 mg Oral BID   insulin aspart  0-15 Units Subcutaneous TID WC   insulin aspart  0-5 Units Subcutaneous QHS   ipratropium-albuterol  3 mL Nebulization Q6H   levothyroxine  200 mcg Oral Q0600   predniSONE  40 mg Oral Q breakfast   sertraline  50 mg Oral Daily   Continuous Infusions:  azithromycin     cefTRIAXone (ROCEPHIN)  IV     dexmedetomidine (PRECEDEX) IV infusion 1 mcg/kg/hr (07/15/21 0800)   diltiazem (CARDIZEM) infusion Stopped (06/28/2021 1628)   PRN Meds: acetaminophen **OR** acetaminophen, albuterol, haloperidol lactate, LORazepam, ondansetron **OR** ondansetron (ZOFRAN) IV   Vital Signs    Vitals:   07/15/21 0500 07/15/21 0600 07/15/21 0700 07/15/21 0800  BP: 110/89 123/84 133/87 108/69  Pulse: 61 (!) 31 (!) 27 62  Resp: (!) 35 (!) 30 (!) 25 (!) 30  Temp:    (!) 96.4 F (35.8 C)  TempSrc:    Axillary  SpO2: 94% (!) 85% 100% 91%  Weight:      Height:        Intake/Output Summary (Last 24 hours) at 07/15/2021 0914 Last data filed at 07/15/2021 0800 Gross per 24 hour  Intake 472.03 ml  Output 300 ml  Net 172.03 ml      06/28/2021    12:15 AM 03/02/2021    5:00 AM 03/01/2021    5:00 AM  Last 3 Weights  Weight (lbs) 187 lb 187 lb 13.3 oz 187 lb 6.3 oz  Weight (kg) 84.823 kg 85.2 kg 85 kg      Telemetry    Rate controlled atrial fibrillation - Personally Reviewed  ECG    No recent studies have been preformed  Physical Exam   GEN: No acute distress. Resting in bed with her eyes closed.  Neck: No JVD appreciated Cardiac: irregularly irregular rate controlled atrial fibrillation on the bedside cardiac monitor, no murmurs, rubs, or gallops.  Respiratory: Coarse the upper lobes, diminished in the bases to auscultation bilaterally. Respirations are unlabored on 4L O2 via [place] GI: Soft, nontender, non-distended, obese, bowel sounds present in all 4 quadrants MS: No edema; No deformity. Neuro:  Nonfocal  Psych: Normal affect   Labs    High Sensitivity Troponin:   Recent Labs  Lab 07/09/2021 0037 07/13/2021 1636  TROPONINIHS 11 10     Chemistry Recent Labs  Lab 07/09/2021 0037 07/15/21 0610  NA 140 141  K 4.1 5.3*  CL 108 106  CO2 27 22  GLUCOSE 114* 151*  BUN 20 28*  CREATININE 0.76 0.84  CALCIUM 9.0 9.3  MG  --  2.3  PROT 6.9  --   ALBUMIN 3.5  --   AST 27  --   ALT 18  --   ALKPHOS 58  --   BILITOT 1.5*  --   GFRNONAA >60 >60  ANIONGAP 5 13    Lipids No results for input(s): CHOL, TRIG, HDL, LABVLDL, LDLCALC, CHOLHDL in the last 168 hours.  Hematology Recent Labs  Lab 07/09/2021 0037 07/11/2021 1636 07/15/21 0610  WBC 14.4*  --  13.0*  RBC 4.79 4.35 5.25*  HGB 9.5*  --  10.3*  HCT 34.4*  --  38.6  MCV 71.8*  --  73.5*  MCH 19.8*  --  19.6*  MCHC 27.6*  --  26.7*  RDW 20.6*  --  21.3*  PLT 444*  --  249   Thyroid  Recent Labs  Lab 06/27/2021 0037  TSH 1.114    BNP Recent Labs  Lab 07/03/2021 0042  BNP 302.8*    DDimer No results for input(s): DDIMER in the last 168 hours.   Radiology    DG Abd 1 View  Result Date: 07/13/2021 CLINICAL DATA:  Distension EXAM: ABDOMEN - 1 VIEW  COMPARISON:  03/03/2014 FINDINGS: Nonobstructive bowel gas pattern. Chronic elevation of the right hemidiaphragm. Atherosclerotic vascular calcifications. No acute bony findings. IMPRESSION: Nonobstructive bowel gas pattern. Electronically Signed   By: Davina Poke D.O.   On: 07/13/2021 17:29   DG Chest Portable 1 View  Result Date: 07/06/2021 CLINICAL DATA:  Shortness of breath EXAM: PORTABLE CHEST 1 VIEW COMPARISON:  02/26/2021 FINDINGS: Mild right basilar opacity, favored to reflect a combination of atelectasis and small right pleural effusion. Hazy opacity overlying the left hemithorax favors soft tissue rather than a pulmonary opacity. The heart is top-normal in size.  Thoracic aortic atherosclerosis. Degenerative changes of the right shoulder. IMPRESSION: Mild right basilar opacity, favored to reflect a combination of atelectasis and small right pleural effusion. Electronically Signed   By: Julian Hy M.D.   On: 07/02/2021 01:01    Cardiac Studies   Echocardiogram ordered and completed with report pending  Patient Profile     76 y.o. female with a history of persistent possibly permanent atrial fibrillation on chronic anticoagulation with Eliquis, chronic hypoxic respiratory failure on supplemental oxygen at 4 L at home, prior right MCA infarct, HFpEF, microcytic anemia, COPD, and essential hypertension who is being seen today for the evaluation of atrial fibrillation with RVR in the setting of a COPD exacerbation.  Assessment & Plan    Acute on chronic hypoxic respiratory failure - Likely multifactorial including COPD exacerbation with known severe COPD, volume overload, possible pneumonia, atrial fibrillation with RVR, and anemia - Titrate supplemental oxygen back to baseline as tolerated - Continue to titrate FiO2 to keep O2 sats greater than equal to 90% - Supportive care  2.  Persistent, if not permanent atrial fibrillation with RVR -Likely exacerbated due to COPD  exacerbation -Avoid beta-blockers for now with the COPD exacerbation -Received one-time dose of IV digoxin 0.25 mg on 07/09/2021 -Continue PTA Eliquis -Continue oral diltiazem 30 mg every 6 hours -If she does not pass her swallow study this morning if heart rate continues above 120 may need to restart diltiazem drip at that time - TSH normal  - Potassium 5.3   3. HFpEF - Echocardiogram completed with results pending - Echocardiogram done in 2020 showed normal LV systolic function with mild to moderate tricuspid regurgitation and mild pulmonary hypertension - Received one-time dose of furosemide 40  mg IV on 06/30/2021 May repeat as needed  4.  COPD exacerbation with possible pneumonia -Consider transitioning albuterol to Xopenex for assistance with ventricular rate control -Supportive care       For questions or updates, please contact [facility] Please consult [email or web address] for contact info under        Signed, Legacy Lacivita, NP  07/15/2021, 9:14 AM

## 2021-07-15 NOTE — Consult Note (Signed)
Consultation Note Date: 07/15/2021   Patient Name: Summer Bradley  DOB: [birth year]  MRN: [id number]  Age / Sex: 76 y.o., female  PCP: [place] Referring Physician: Enzo Bi, MD  Reason for Consultation: Establishing goals of care  HPI/Patient Profile: Summer Bradley is a 76 y.o. female with medical history significant for DM, HTN, prior CVA, A-fib on apixaban, HFpEF, COPD, chronic respiratory failure on home O2 at 4 L, who was brought to the ED by EMS with shortness of breath and confusion.  Patient initially called EMS with her medical alert and they found her confused and agitated on their arrival saying that she could not breathe.  This history was taken from ED provider. ED course and data review: on Arrival afebrile, heart rate 165 with respirations 37, BP 114/72 but as low as 96/74 with O2 sat 81% on 3 L.  EKG, personally reviewed and interpreted, A-fib at 155.  Chest x-ray with mild right basilar opacity, favored to reflect a combination of atelectasis and small right pleural effusion.  Labs with white cell count 14,000, hemoglobin 9.5, CMP unremarkable except for total bili of 1.5.  Troponin 11 and BNP pending.  COVID-negative.  Clinical Assessment and Goals of Care:   Notes and labs reviewed. In to see patient; she is resting with eyes closed. Called to speak with daughter.   She lives alone. Any exertion causes SOB. Daughter states she yells out frequently for help as she is anxious. She states she get choked when eating or drinking at times at baseline. Daughter states patient has told her she was suffering.  Patient is under PACE care. Plans to meet tomorrow.   SUMMARY OF RECOMMENDATIONS   Meeting with daughter and patient tomorrow.    Prognosis:  < 6 months       Primary Diagnoses: Present on Admission:  COPD with acute exacerbation ([place])  Acute on chronic  respiratory failure with hypoxemia (HCC)  Chronic heart failure with preserved ejection fraction (HFpEF) (HCC)  Atrial fibrillation with rapid ventricular response (HCC)  Essential hypertension  Abdominal distension   I have reviewed the medical record, interviewed the patient and family, and examined the patient. The following aspects are pertinent.  Past Medical History:  Diagnosis Date   A-fib ([place])    Anxiety    COPD (chronic obstructive pulmonary disease) (HCC)    Cough    CHONIC   Depression    Dysrhythmia    GERD (gastroesophageal reflux disease)    Heart disease    High cholesterol    Hypertension    Hypothyroidism    Orthopnea    Oxygen dependent    2/L CONTINUOUSLY   Stroke ([place])    Vertigo    Wheezing    Social History   Socioeconomic History   Marital status: Single    Spouse name: Not on file   Number of children: 4   Years of education: 43 TH   Highest education level: Not on file  Occupational History  Not on file  Tobacco Use   Smoking status: Former   Smokeless tobacco: Never   Tobacco comments:    QUIT SMOKING "13 YEARS AGO"  Substance and Sexual Activity   Alcohol use: No    Alcohol/week: 0.0 standard drinks   Drug use: No   Sexual activity: Not on file  Other Topics Concern   Not on file  Social History Narrative   Patient is widowed with 4 children.   Patient is right handed.   Patient has 11 th grade education.   Patient drinks 2 sodas daily.   Social Determinants of Health   Financial Resource Strain: Not on file  Food Insecurity: Not on file  Transportation Needs: Not on file  Physical Activity: Not on file  Stress: Not on file  Social Connections: Not on file   Family History  Problem Relation Age of Onset   Diabetes Mother    Scheduled Meds:  apixaban  5 mg Oral BID   Chlorhexidine Gluconate Cloth  6 each Topical Daily   diltiazem  30 mg Oral Q6H   famotidine  20 mg Oral BID   insulin aspart  0-15 Units  Subcutaneous TID WC   insulin aspart  0-5 Units Subcutaneous QHS   ipratropium-albuterol  3 mL Nebulization Q6H   levothyroxine  200 mcg Oral Q0600   methylPREDNISolone (SOLU-MEDROL) injection  40 mg Intravenous Q12H   sertraline  50 mg Oral Daily   Continuous Infusions:  azithromycin Stopped (07/15/21 1205)   cefTRIAXone (ROCEPHIN)  IV 2 g (07/15/21 0930)   dexmedetomidine (PRECEDEX) IV infusion Stopped (07/15/21 1205)   diltiazem (CARDIZEM) infusion Stopped (06/27/2021 1628)   PRN Meds:.acetaminophen **OR** acetaminophen, albuterol, haloperidol lactate, LORazepam, ondansetron **OR** ondansetron (ZOFRAN) IV Medications Prior to Admission:  Prior to Admission medications   Medication Sig Start Date End Date Taking? Authorizing Provider  albuterol (PROVENTIL HFA;VENTOLIN HFA) 108 (90 BASE) MCG/ACT inhaler Inhale 2 puffs into the lungs every 4 (four) hours as needed for wheezing or shortness of breath.    Yes [provider]  albuterol (PROVENTIL) (2.5 MG/3ML) 0.083% nebulizer solution Take 2.5 mg by nebulization every 6 (six) hours as needed for wheezing or shortness of breath.   Yes [provider]  apixaban (ELIQUIS) 5 MG TABS tablet Take 1 tablet (5 mg total) by mouth 2 (two) times daily. 03/07/14  Yes Rosalin Hawking, MD  diltiazem (CARDIZEM CD) 300 MG 24 hr capsule Take 1 capsule (300 mg total) by mouth daily. 01/03/19  Yes Wieting, Richard, MD  famotidine (PEPCID) 20 MG tablet Take 1 tablet (20 mg total) by mouth daily. Patient taking differently: Take 20 mg by mouth 2 (two) times daily. 01/03/19  Yes Wieting, Richard, MD  furosemide (LASIX) 40 MG tablet Take 40 mg by mouth daily. 11/16/20  Yes [provider]  levothyroxine (SYNTHROID) 200 MCG tablet Take 200 mcg by mouth daily. 11/16/20  Yes [provider]  LORazepam (ATIVAN) 1 MG tablet Take 1 tablet (1 mg total) by mouth 2 (two) times daily. 03/05/21  Yes Jennye Boroughs, MD  melatonin 3 MG TABS tablet  Take 3-6 mg by mouth at bedtime.   Yes [provider]  metFORMIN (GLUCOPHAGE) 500 MG tablet Take 500 mg by mouth 2 (two) times daily. 02/16/21  Yes [provider]  potassium chloride SA (K-DUR,KLOR-CON) 20 MEQ tablet Take 2 tablets (40 mEq total) by mouth 2 (two) times daily. 03/05/14  Yes Rosalin Hawking, MD  predniSONE (DELTASONE) 5 MG tablet  Take 5 mg by mouth daily. 01/16/21  Yes [provider]  QUEtiapine (SEROQUEL) 25 MG tablet Take 1 tablet (25 mg total) by mouth at bedtime as needed. 01/03/19  Yes Wieting, Richard, MD  senna (SENOKOT) 8.6 MG TABS tablet Take 1 tablet by mouth at bedtime. 01/16/21  Yes [provider]  sertraline (ZOLOFT) 50 MG tablet Take 50 mg by mouth daily. 06/16/21  Yes [provider]  umeclidinium-vilanterol (ANORO ELLIPTA) 62.5-25 MCG/ACT AEPB Inhale 1 puff into the lungs daily. 03/06/21  Yes Jennye Boroughs, MD  acetaminophen (TYLENOL) 325 MG tablet Take 325-650 mg by mouth 3 (three) times daily as needed for mild pain or moderate pain.    [provider]  Emollient (EUCERIN EX) Apply 1 application topically 2 (two) times daily as needed.    [provider]  LORazepam (ATIVAN) 0.5 MG tablet Take 1 tablet (0.5 mg total) by mouth daily as needed for anxiety. Patient not taking: Reported on 07/05/2021 03/05/21   Jennye Boroughs, MD  metolazone (ZAROXOLYN) 2.5 MG tablet Take 2.5 mg by mouth every Wednesday. On Wednesdays with furosemide in the am    [provider]   Allergies  Allergen Reactions   Lisinopril Cough   Amoxicillin    Review of Systems  Unable to perform ROS  Physical Exam Constitutional:      Comments: Eyes closed.   Pulmonary:     Effort: Pulmonary effort is normal.    Vital Signs: BP 108/69 (BP Location: Left Arm)   Pulse 62   Temp (!) 95.9 F (35.5 C) (Axillary) Comment: pt is chilly and likes to pull covers off, updated RN  Resp (!) 30   Ht '5\' 5"'$  (1.651 m)   Wt 84.8 kg   SpO2  91%   BMI 31.12 kg/m  Pain Scale: 0-10 POSS *See Group Information*: S-Acceptable,Sleep, easy to arouse Pain Score: Asleep   SpO2: SpO2: 91 % O2 Device:SpO2: 91 % O2 Flow Rate: .O2 Flow Rate (L/min): 5 L/min  IO: Intake/output summary:  Intake/Output Summary (Last 24 hours) at 07/15/2021 1342 Last data filed at 07/15/2021 1300 Gross per 24 hour  Intake 472.03 ml  Output 500 ml  Net -27.97 ml    LBM: Last BM Date : 07/13/21 Baseline Weight: Weight: 84.8 kg Most recent weight: Weight: 84.8 kg      Signed by: Asencion Gowda, NP   Please contact Palliative Medicine Team phone at [phone] for questions and concerns.  For individual provider: See Shea Evans

## 2021-07-15 NOTE — Progress Notes (Addendum)
Progress Note   Patient: Summer Bradley TAV:[id number] DOB: 1945-11-19 DOA: 06/21/2021     1 DOS: the patient was seen and examined on 07/15/2021   Brief hospital course: Summer Bradley is a 76 y.o. female with medical history significant for DM, HTN, prior CVA, A-fib on apixaban, HFpEF, COPD, chronic respiratory failure on home O2 at 4 L, who was brought to the ED by EMS with shortness of breath and confusion.  Patient initially called EMS with her medical alert and they found her confused and agitated on their arrival saying that she could not breathe.  This history was taken from ED provider. ED course and data review: on Arrival afebrile, heart rate 165 with respirations 37, BP 114/72 but as low as 96/74 with O2 sat 81% on 3 L.  EKG, personally reviewed and interpreted, A-fib at 155.  Chest x-ray with mild right basilar opacity, favored to reflect a combination of atelectasis and small right pleural effusion.  Labs with white cell count 14,000, hemoglobin 9.5, CMP unremarkable except for total bili of 1.5.  Troponin 11 and BNP pending.  COVID-negative. Patient treated with an IV fluid bolus, serial DuoNebs, methylprednisolone, Rocephin and azithromycin and started on a diltiazem infusion.  Hospitalist consulted for admission for treatment of sepsis, COPD, pneumonia and rapid A-fib.   Assessment and Plan: * Acute on chronic respiratory failure with hypoxemia (HCC) Chronic 4L O2 at [place] Suspect multifactorial related to COPD exacerbation, possible pneumonia and Afib w RVR --persistent dyspnea associated with severe anxiety --No intubation and currently also refusing BiPAP. --pt has had many of these similar hospitalizations, ane each time, she required BiPAP initially. I talked to Summer Bradley POA earlier on the phone, and she was surprised that pt refused BiPAP this time and said she was going to convince pt to take it.  I will order IV morphine again to see if that will help with her  dyspnea (which is driving her anxiety). If it doesn't work, and pt gets worked up again but still refuse BiPAP, then put her back on precedex gtt tonight to get her through to tomorrow, when palliative care provider will have a meeting with family. --pt was clear-headed on 5/29 when she said she didn't want BiPAP, and ICU RN noted pt saying she just wanted to be comfortable.  Pt has been through so many of these hospitalizations, so I think comfort care is appropriate for her to stop her suffering, but her daughters have to agree.  Plan: --Continue supplemental O2 to keep sats between 88-92% --IV morphine for air hunger --precedex gtt for severe anxiety --palliative care provider to have goals of care meeting with family tomorrow   Sepsis ([place]) Sepsis criteria include tachycardia to 137, tachypnea to 30, leukocytosis of 14,000, lactic acid pending Continue to treat suspected pneumonia  Severe anxiety --has chronic anxiety and current Rx for ativan --currently having severe anxiety with subjective feeling of not being able to breath (even while sating in high 90's).  IV Ativan, IV haldol and IV morphine did not help Plan: --precedex gtt PRN  Atrial fibrillation with RVR (HCC) Likely driven by acute respiratory illness --started on dilt gtt --cardio consulted -Received one-time dose of IV digoxin 0.25 mg on 06/23/2021 Plan: --cont dilt gtt if pt is too somnolent for oral dilt --IV heparin gtt while pt is too somnolent to take Eliquis, for primary stroke prevention given CHADsVasc high at 6  COPD with acute exacerbation ([place]) --frequent hospitalizations for COPD exacerbation --cont  IV solumedrol --switch scheduled nebs to xopenex and atrovent due to tachycardia  CAP (community acquired pneumonia) Chest x-ray with mild right basilar opacity favored to reflect atelectasis and small right pleural effusion --procal neg --started on empiric Rocephin and azithromycin Plan: --cont empiric  ceftriaxone and azithromycin for now  Chronic heart failure with preserved ejection fraction (HFpEF) ([place]) Last echo November 2020 with EF 55 to 60% --IV lasix 40 x1 on 5/29 Plan: --Echo --further diuretic per cardio   Acute metabolic encephalopathy --pt was reportedly found confused on presentation.  Likely due to hypoxia.    Abdominal distension --per pt and family, this is chronic and due to ventral hernia.  No abdominal pain.  KUB benign.  Anemia Hemoglobin 9.5, down from baseline of 10-11 in the setting of chronic anticoagulation Continue to monitor hemoglobin Follow anemia panel  Lactic acidosis --has not cleared, but want to avoid IVF right now to avoid causing fluid overload to worsen her respiratory status.  Diabetes mellitus without complication ([place]) Hyperglycemia exacerbated by steroid use --not on insulin at home --ACHS and SSI   History of CVA (cerebrovascular accident) Not currently on statin or ASA per home med  Essential hypertension Borderline low blood pressure        Subjective:  Pt was calm this morning while on precedex gtt, however, starting to become more anxious when precedex gtt was weaned off later in the day.  Anxiety is largely driven by dyspnea, however, O2 sats around 97% on 4L.    Discussed with daughter Summer Bradley on the phone, who said she will convince pt to accept BiPAP.  Palliative care consulted who is planning on a meeting with family tomorrow.   Physical Exam:  Constitutional: NAD, lethargic CV: No cyanosis.   RESP: 97% on 4L Extremities: No effusions, edema in BLE SKIN: warm, dry   Data Reviewed:  Family Communication: daughter Summer Bradley updated on the phone today  Disposition: Status is: Inpatient   Planned Discharge Destination:  undetermined    Time spent: 50 minutes Billed for extended service  Author: Enzo Bi, MD 07/15/2021 9:49 PM  For on call review [email or web address].   Extended service.

## 2021-07-16 DIAGNOSIS — G9341 Metabolic encephalopathy: Secondary | ICD-10-CM | POA: Diagnosis not present

## 2021-07-16 DIAGNOSIS — J9621 Acute and chronic respiratory failure with hypoxia: Secondary | ICD-10-CM | POA: Diagnosis not present

## 2021-07-16 DIAGNOSIS — I5032 Chronic diastolic (congestive) heart failure: Secondary | ICD-10-CM | POA: Diagnosis not present

## 2021-07-16 DIAGNOSIS — Z7189 Other specified counseling: Secondary | ICD-10-CM | POA: Diagnosis not present

## 2021-07-16 DIAGNOSIS — I4891 Unspecified atrial fibrillation: Secondary | ICD-10-CM | POA: Diagnosis not present

## 2021-07-16 LAB — APTT
aPTT: 137 seconds — ABNORMAL HIGH (ref 24–36)
aPTT: 52 seconds — ABNORMAL HIGH (ref 24–36)
aPTT: 64 seconds — ABNORMAL HIGH (ref 24–36)

## 2021-07-16 LAB — BASIC METABOLIC PANEL
Anion gap: 11 (ref 5–15)
BUN: 44 mg/dL — ABNORMAL HIGH (ref 8–23)
CO2: 25 mmol/L (ref 22–32)
Calcium: 8.9 mg/dL (ref 8.9–10.3)
Chloride: 106 mmol/L (ref 98–111)
Creatinine, Ser: 1.1 mg/dL — ABNORMAL HIGH (ref 0.44–1.00)
GFR, Estimated: 52 mL/min — ABNORMAL LOW (ref >60)
Glucose, Bld: 149 mg/dL — ABNORMAL HIGH (ref 70–99)
Potassium: 4.3 mmol/L (ref 3.5–5.1)
Sodium: 142 mmol/L (ref 135–145)

## 2021-07-16 LAB — CBC
HCT: 33.8 % — ABNORMAL LOW (ref 36.0–46.0)
Hemoglobin: 9.5 g/dL — ABNORMAL LOW (ref 12.0–15.0)
MCH: 19.7 pg — ABNORMAL LOW (ref 26.0–34.0)
MCHC: 28.1 g/dL — ABNORMAL LOW (ref 30.0–36.0)
MCV: 70.1 fL — ABNORMAL LOW (ref 80.0–100.0)
Platelets: 418 10*3/uL — ABNORMAL HIGH (ref 150–400)
RBC: 4.82 MIL/uL (ref 3.87–5.11)
RDW: 20.7 % — ABNORMAL HIGH (ref 11.5–15.5)
WBC: 16.8 10*3/uL — ABNORMAL HIGH (ref 4.0–10.5)
nRBC: 1.7 % — ABNORMAL HIGH (ref 0.0–0.2)

## 2021-07-16 LAB — GLUCOSE, CAPILLARY
Glucose-Capillary: 127 mg/dL — ABNORMAL HIGH (ref 70–99)
Glucose-Capillary: 129 mg/dL — ABNORMAL HIGH (ref 70–99)
Glucose-Capillary: 134 mg/dL — ABNORMAL HIGH (ref 70–99)
Glucose-Capillary: 141 mg/dL — ABNORMAL HIGH (ref 70–99)

## 2021-07-16 LAB — HEPARIN LEVEL (UNFRACTIONATED): Heparin Unfractionated: >1.1 IU/mL — ABNORMAL HIGH (ref 0.30–0.70)

## 2021-07-16 LAB — MAGNESIUM: Magnesium: 2.4 mg/dL (ref 1.7–2.4)

## 2021-07-16 MED ORDER — BENZOCAINE 10 % MT GEL
Freq: Four times a day (QID) | OROMUCOSAL | Status: DC | PRN
  Filled 2021-07-16: qty 9

## 2021-07-16 MED ORDER — GELCLAIR MT GEL: 1.0000 | OROMUCOSAL | Status: DC | PRN

## 2021-07-16 MED ORDER — HEPARIN (PORCINE) 25000 UT/250ML-% IV SOLN: 1100.0000 [IU]/h | INTRAVENOUS | Status: DC

## 2021-07-16 MED ORDER — HEPARIN BOLUS VIA INFUSION
1100.0000 [IU] | Freq: Once | INTRAVENOUS | Status: AC
  Administered 2021-07-16: 1100 [IU] via INTRAVENOUS
  Filled 2021-07-16: qty 1100

## 2021-07-16 MED ORDER — HEPARIN BOLUS VIA INFUSION
2000.0000 [IU] | Freq: Once | INTRAVENOUS | Status: AC
  Administered 2021-07-16: 2000 [IU] via INTRAVENOUS
  Filled 2021-07-16: qty 2000

## 2021-07-16 MED ORDER — INSULIN ASPART 100 UNIT/ML IJ SOLN
0.0000 [IU] | Freq: Four times a day (QID) | INTRAMUSCULAR | Status: DC
  Administered 2021-07-16 (×2): 2 [IU] via SUBCUTANEOUS
  Filled 2021-07-16: qty 1

## 2021-07-16 NOTE — Progress Notes (Signed)
SLP Cancellation Note  Patient Details Name: Summer Bradley MRN: [id number] DOB: [birth year]   Cancelled treatment:       Reason Eval/Treat Not Completed: Patient not medically ready;Medical issues which prohibited therapy;Fatigue/lethargy limiting ability to participate (chart reviewed; consulted NSG re: pt's status this AM) Per chart review and discussion w/ NSG this morning, pt remains on Precedex. NSG stated the plan is to wean the Precedex today. Pt's presentation is drowsy w/ mumbled speech.   Dtr has endorsed to this Clinician and to Team members concern of pt's swallowing deficits at home -- "she gets choked when eating or drinking at times at baseline.". She also stated pt is anxious and SOB w/ any exertion.   D/t pt's Baseline dysphagia, CVAs, and current lethargic presentation, ST services recommends Holding on BSE until pt is completely weaned from any sedating medications as such can significantly impact swallowing and increase risk for aspiration thus Pulmonary decline.   Encouraged continued use of Oral Swabs for oral hygiene and stimulation of swallowing -- recommended squeezing out water of the sponge swabs d/t report of pt "coughing" w/ swabs by Sitter. NSG updated.    ST services will continue to follow pt's status and perform BSE when pt has weaned from all sedating medications. Noted Palliative Care consult order for Family/pt discussion of GOC today.      Orinda Kenner, MS, CCC-SLP Speech Language Pathologist Rehab Services; [place] [phone] (ascom) Summer Bradley 07/16/2021, 8:37 AM

## 2021-07-16 NOTE — Progress Notes (Signed)
PT Cancellation Note  Patient Details Name: Briell Paulette MRN: [id number] DOB: [birth year]   Cancelled Treatment:    Reason Eval/Treat Not Completed: Other (comment): Per conversation with nursing pt not appropriate for participation with PT services at this time.  Per nursing, PT orders to be completed at this time and medical team will reinstate PT orders once patient is appropriate.    Linus Salmons PT, DPT 07/16/21, 8:39 AM

## 2021-07-16 NOTE — Progress Notes (Addendum)
Daily Progress Note   Patient Name: Summer Bradley       Date: 07/16/2021 DOB: 1945-02-21  Age: 76 y.o. MRN#: [id number] Attending Physician: Annita Brod, MD Primary Care Physician: [place] Date: 06/24/2021  Reason for Consultation/Follow-up: Establishing goals of care  Subjective: Chart reviewed. Patient is resting in bed on sedation drip. Daughters are at bedside. PACE DO Sharee Pimple was present throughout entire meeting.   They discuss her suffering at baseline. They state she frequently yells out "help me." They tell me she becomes dyspneic, complaining of SOB with any exertion. They state her QOL is very poor.     We discussed her diagnoses, poor prognosis, GOC, EOL wishes disposition and options.  Created space and opportunity for patient  to explore thoughts and feelings regarding current medical information.   A detailed discussion was had today regarding advanced directives.  Concepts specific to code status, artifical feeding and hydration, IV antibiotics and rehospitalization were discussed.  The difference between an aggressive medical intervention path and a comfort care path was discussed.  Values and goals of care important to patient and family were attempted to be elicited.  Discussed limitations of medical interventions to prolong quality of life in some situations and discussed the concept of human mortality. They state she is a woman of faith, as are they. We discussed suffering and acceptable QOL. Discussed her dysphagia in addition to SOB from COPD and how this impacts QOL.   Family attempts to ask her wishes and she is unable to clearly answer them. They are aware she has required sedation due to agitation, suffering, and saying "help me".  They request her sedation be stopped so they can have a conversation with her. PACE MD and I stepped out to give family space. Upon reentry to check on patient family states they are waiting for their brother to arrive and, would like to resume sedation once he has been to bedside.    ADDENDUM: Family wants to make the decision to shift to comfort care tomorrow. Primary MD updated, and recommendations for symptom management proivided. At this time, patient is hospice facility appropriate if they choose. Will need TOC to reach out to hospice liaison.    Length of Stay: 2  Current Medications: Scheduled Meds:   Chlorhexidine Gluconate Cloth  6 each Topical Daily   famotidine  20 mg Oral BID   insulin aspart  0-15 Units Subcutaneous Q6H   ipratropium  0.5 mg Nebulization Q6H   levalbuterol  0.63 mg Nebulization Q6H   levothyroxine  200 mcg Oral Q0600   methylPREDNISolone (SOLU-MEDROL) injection  40 mg Intravenous Q12H   nystatin   Topical BID   sertraline  50 mg Oral Daily    Continuous Infusions:  azithromycin Stopped (07/16/21 1132)   cefTRIAXone (ROCEPHIN)  IV Stopped (07/16/21 0954)   dexmedetomidine (PRECEDEX) IV infusion 0.5 mcg/kg/hr (07/16/21 1400)   diltiazem (CARDIZEM) infusion 5 mg/hr (07/16/21 1400)   heparin 1,200 Units/hr (07/16/21 1400)    PRN Meds: acetaminophen **OR** acetaminophen, albuterol, haloperidol lactate, LORazepam, morphine injection, ondansetron **OR** ondansetron (ZOFRAN) IV  Physical Exam Constitutional:      Comments: Eyes closed and resting. Later WOB noted when sedation was held.   Skin:    General: Skin is warm and dry.            Vital Signs: BP 111/77   Pulse (!) 26   Temp (!) 96.7 F (35.9 C) (Axillary)   Resp (!) 26   Ht '5\' 5"'$  (1.651 m)   Wt 84.8 kg   SpO2 (!) 72%   BMI 31.12 kg/m  SpO2: SpO2: (!) 72 % O2 Device: O2 Device: Nasal Cannula O2 Flow Rate: O2 Flow Rate (L/min): 4 L/min  Intake/output summary:  Intake/Output Summary  (Last 24 hours) at 07/16/2021 1502 Last data filed at 07/16/2021 1400 Gross per 24 hour  Intake 990.31 ml  Output 700 ml  Net 290.31 ml   LBM: Last BM Date : 07/13/2021 Baseline Weight: Weight: 84.8 kg Most recent weight: Weight: 84.8 kg    Patient Active Problem List   Diagnosis Date Noted   Acute metabolic encephalopathy 25/06/3974   Sepsis ([place]) 07/10/2021   Anemia 07/13/2021   Abdominal distension 07/04/2021   Acute and chronic respiratory failure with hypoxia ([place]) 02/26/2021   Pneumonia due to gram-negative bacteria ([place]) 02/26/2021   Edema    CAP (community acquired pneumonia)    Acute hypoxemic respiratory failure ([place]) 11/16/2020   Hypokalemia    Lactic acidosis    Weakness    Chronic a-fib (HCC)    Diabetes mellitus without complication ([place]) 73/41/9379   GERD (gastroesophageal reflux disease) 09/19/2019   Fall 09/19/2019   COPD exacerbation ([place]) 08/14/2019   Acute on chronic respiratory failure with hypoxia ([place]) 08/13/2019   Hypothyroidism 08/13/2019   History of CVA (cerebrovascular accident) 08/13/2019   Palliative care by specialist    Goals of care, counseling/discussion    Acute delirium    Atrial fibrillation with RVR (HCC)    Severe anxiety    Chronic heart failure with preserved ejection fraction (HFpEF) ([place])    Pulmonary hypertension, unspecified (HCC)    Acute on chronic respiratory failure with hypoxemia ([place]) 02/03/2018   Pulmonary emphysema ([place]) 04/25/2015   Emphysema of lung ([place]) 05/09/2014   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[place]) 05/09/2014   Hyperlipidemia 05/09/2014   GI bleed    Benign neoplasm of colon 03/03/2014   Diverticulosis of colon without hemorrhage 03/03/2014   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   Jejunal ulcer 03/03/2014   Gastric polyps 03/03/2014   Acute posthemorrhagic anemia 03/02/2014   Melena  02/27/2014   LGI bleed    Acute on chronic respiratory  failure ([place]) 02/16/2014   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   Spontaneous pneumothorax    Thrush    COPD with acute exacerbation (HCC)    SOB (shortness of breath)    HLD (hyperlipidemia)    Essential hypertension    Dysphagia, pharyngoesophageal phase    Acute tracheobronchitis 02/04/2014   Atelectasis of left lung 02/04/2014   Wheezing    Acute respiratory failure (HCC)    Paroxysmal A-fib (HCC)    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 Recommend hospice facility placement. This is a [facility]ACE patient, and per the DO present, will be part of the dispo to hospice if patient D/C's there. She states [facility] is formally in favor of hospice facility placement as well.   Primary MD present on unit and spoke with PACE and I.   ADDENDUM: Family wants to make decision to shift to comfort tomorrow. At this time, patient is hospice facility appropriate if they choose. Upon stopping sedation, would recommend Morphine titratable infusion with PRN boluses, as well as an Ativan infusion. This was discussed with primary team.   Code Status:    Code Status Orders  (From admission, onward)           Start     Ordered   07/01/2021 0247  Do not attempt resuscitation (DNR)  Continuous       Question Answer Comment  In the event of cardiac or respiratory ARREST Do not call a "code blue"   In the event of cardiac or respiratory ARREST Do not perform Intubation, CPR, defibrillation or ACLS   In the event of cardiac or respiratory ARREST Use medication by any route, position, wound care, and other measures to relive pain and suffering. May use oxygen,  suction and manual treatment of airway obstruction as needed for comfort.      06/25/2021 0249           Code Status History     Date Active Date Inactive Code Status Order ID Comments User Context   02/26/2021 1745 03/05/2021 2011 DNR [id number]  Tacey Ruiz, MD ED   02/26/2021 1504 02/26/2021 1745 DNR [id number]  Tacey Ruiz, MD ED   01/18/2021 2036 01/21/2021 1641 DNR [id number]  Rise Patience, MD Inpatient   01/18/2021 1710 01/18/2021 2036 Full Code [id number]  Clarnce Flock, MD ED   11/16/2020 1544 11/18/2020 2256 Full Code [id number]  Clarnce Flock, MD ED   09/19/2019 1540 09/21/2019 1616 DNR [id number]  Ivor Costa, MD ED   08/14/2019 0007 08/17/2019 1856 DNR [id number]  Lenore Cordia, MD ED   04/28/2019 1516 04/29/2019 1937 DNR [id number]  Lorella Nimrod, MD ED   04/28/2019 1516 04/28/2019 1516 DNR [id number]  Lorella Nimrod, MD ED   04/28/2019 1515 04/28/2019 1516 Full Code [id number]  Lorella Nimrod, MD ED   12/28/2018 1934 01/03/2019 1857 DNR [id number]  Awilda Bill, NP Inpatient   12/23/2018 2233 12/28/2018 1934 Full Code [id number]  Mansy, Arvella Merles, MD ED   02/03/2018 0934 02/10/2018 1954 Full Code [id number]  Arta Silence, MD Inpatient   02/02/2014 0933 03/05/2014 1624 Full Code [id number]  Wilhelmina Mcardle, MD Inpatient   02/01/2014 1236 02/02/2014 0933 Full Code [id number]  Rob Hickman, MD Inpatient   01/29/2014 1119 02/01/2014 1236 Full Code [id number]  Roland Rack, MD Inpatient      Advance Directive Documentation    Flowsheet Row Most Recent Value  Type of Advance Directive Out of facility DNR (pink MOST or yellow form)  Pre-existing out of facility DNR order (yellow form or pink MOST form) Pink Most/Yellow Form available - Physician notified to receive inpatient order  [copy in Vynca]  "MOST" Form in Place? --       Prognosis:  < 2 weeks    Care plan was discussed with RN, primary MD and [facility]ACE while on unit. Epic hat sent to primary MD.    Thank you for allowing the Palliative Medicine Team to assist in the care of this patient.   Asencion Gowda, NP  Please contact Palliative Medicine Team phone at [phone] for questions and concerns.

## 2021-07-16 NOTE — Progress Notes (Signed)
OT Cancellation Note  Patient Details Name: Summer Bradley MRN: [id number] DOB: 10/08/1945   Cancelled Treatment:    Reason Eval/Treat Not Completed: Other (comment) (per discussion with PT, pt not appropriate for therapy at this time. OT will sign off. Please re-consult when pt is appropriate for therapy participation.)  Shanon Payor, OTD OTR/L  07/16/21, 8:43 AM

## 2021-07-16 NOTE — Progress Notes (Addendum)
[place] for heparin infusion initiation and monitoring Indication: atrial fibrillation  Allergies  Allergen Reactions   Lisinopril Cough   Amoxicillin     Patient Measurements: Height: '5\' 5"'$  (165.1 cm) Weight: 84.8 kg (187 lb) IBW/kg (Calculated) : 57 Heparin Dosing Weight: 75.3 kg  Vital Signs: Temp: 97.5 F (36.4 C) (05/31 0030) Temp Source: Axillary (05/31 0030) BP: 123/93 (05/31 0600) Pulse Rate: 32 (05/31 0600)  Labs: Recent Labs    06/29/2021 0037 06/27/2021 1636 07/15/21 0610 07/15/21 2359 07/16/21 0440  HGB 9.5*  --  10.3*  --  9.5*  HCT 34.4*  --  38.6  --  33.8*  PLT 444*  --  249  --  418*  APTT  --   --   --  64*  --   CREATININE 0.76  --  0.84  --  1.10*  TROPONINIHS 11 10  --   --   --      Estimated Creatinine Clearance: 46.8 mL/min (A) (by C-G formula based on SCr of 1.1 mg/dL (H)).   Medical History: Past Medical History:  Diagnosis Date   A-fib (HCC)    Anxiety    COPD (chronic obstructive pulmonary disease) (HCC)    Cough    CHONIC   Depression    Dysrhythmia    GERD (gastroesophageal reflux disease)    Heart disease    High cholesterol    Hypertension    Hypothyroidism    Orthopnea    Oxygen dependent    2/L CONTINUOUSLY   Stroke (HCC)    Vertigo    Wheezing     Medications:  Scheduled:   Chlorhexidine Gluconate Cloth  6 each Topical Daily   famotidine  20 mg Oral BID   insulin aspart  0-15 Units Subcutaneous TID WC   insulin aspart  0-5 Units Subcutaneous QHS   ipratropium  0.5 mg Nebulization Q6H   levalbuterol  0.63 mg Nebulization Q6H   levothyroxine  200 mcg Oral Q0600   methylPREDNISolone (SOLU-MEDROL) injection  40 mg Intravenous Q12H   nystatin   Topical BID   sertraline  50 mg Oral Daily    Assessment: 76 y.o. female w/ PMH of DM, HTN, CVA, A-fib on apixaban, HFpEF, COPD, chronic respiratory failure on home O2 at 4 L, who was brought to the ED by EMS with shortness of  breath and confusion. Because she is not medically cleared for swallowing, apixaban (last dose 06/29/2021 1002)is being stopped and heparin was started  Goal of Therapy:  Heparin level 0.3-0.7 units/ml aPTT 66-102 seconds Monitor platelets by anticoagulation protocol: Yes   Plan:  aPTT remains subtherapeutic in spite of recent rate increase: Give 2000 units bolus x 1 Increase heparin infusion to 1200 units/hr Recheck aPTT level in 8 hours after rate change Continue to monitor H&H and platelets  Vallery Sa, PharmD, BCPS 07/16/2021 7:19 AM

## 2021-07-16 NOTE — Progress Notes (Signed)
[place] for heparin infusion initiation and monitoring Indication: atrial fibrillation  Allergies  Allergen Reactions   Lisinopril Cough   Amoxicillin     Patient Measurements: Height: '5\' 5"'$  (165.1 cm) Weight: 84.8 kg (187 lb) IBW/kg (Calculated) : 57 Heparin Dosing Weight: 75.3 kg  Vital Signs: Temp: 97.8 F (36.6 C) (05/31 2000) Temp Source: Axillary (05/31 2000) BP: 121/84 (05/31 2000) Pulse Rate: 97 (05/31 1900)  Labs: Recent Labs    07/12/2021 0037 06/23/2021 1636 07/15/21 0610 07/15/21 2359 07/16/21 0440 07/16/21 0921 07/16/21 2003  HGB 9.5*  --  10.3*  --  9.5*  --   --   HCT 34.4*  --  38.6  --  33.8*  --   --   PLT 444*  --  249  --  418*  --   --   APTT  --   --   --  64*  --  52* 137*  HEPARINUNFRC  --   --   --   --   --  >1.10*  --   CREATININE 0.76  --  0.84  --  1.10*  --   --   TROPONINIHS 11 10  --   --   --   --   --      Estimated Creatinine Clearance: 46.8 mL/min (A) (by C-G formula based on SCr of 1.1 mg/dL (H)).   Medical History: Past Medical History:  Diagnosis Date   A-fib ([place])    Anxiety    COPD (chronic obstructive pulmonary disease) (HCC)    Cough    CHONIC   Depression    Dysrhythmia    GERD (gastroesophageal reflux disease)    Heart disease    High cholesterol    Hypertension    Hypothyroidism    Orthopnea    Oxygen dependent    2/L CONTINUOUSLY   Stroke (HCC)    Vertigo    Wheezing     Medications:  Scheduled:   Chlorhexidine Gluconate Cloth  6 each Topical Daily   famotidine  20 mg Oral BID   insulin aspart  0-15 Units Subcutaneous Q6H   ipratropium  0.5 mg Nebulization Q6H   levalbuterol  0.63 mg Nebulization Q6H   levothyroxine  200 mcg Oral Q0600   methylPREDNISolone (SOLU-MEDROL) injection  40 mg Intravenous Q12H   nystatin   Topical BID   sertraline  50 mg Oral Daily    Assessment: 76 y.o. female w/ PMH of DM, HTN, CVA, A-fib on apixaban, HFpEF, COPD, chronic  respiratory failure on home O2 at 4 L, who was brought to the ED by EMS with shortness of breath and confusion. Because she is not medically cleared for swallowing, apixaban (last dose 07/01/2021 1002)is being stopped and heparin was started  Goal of Therapy:  Heparin level 0.3-0.7 units/ml aPTT 66-102 seconds Monitor platelets by anticoagulation protocol: Yes   Plan:  5/31 2003 aPTT = 137, Supratherapeutic Communicated with RN -- Hold infusion x 1 hour Resume heparin infusion at reduced rate of 1000 units/hr Recheck aPTT level in 8 hours after rate change Continue to monitor H&H and platelets  Dorothe Pea, PharmD, BCPS Clinical Pharmacist   07/16/2021 9:33 PM

## 2021-07-16 NOTE — Progress Notes (Signed)
[facility] Progress Note  Patient: Summer Bradley    ZOX:[id number]  DOA: 07/02/2021    Date of Service: the patient was seen and examined on 07/16/2021  Brief hospital course: 76 year old female with past medical history of diabetes, hypertension, COPD with chronic respiratory failure on 4 L, chronic systolic heart failure and atrial fibrillation who presented to the emergency room on 5/29 with shortness of breath and confusion.  Patient found to have acute respiratory failure secondary to pneumonia as well as sepsis and started on IV fluids and antibiotics.  Patient also found to be in rapid atrial fibrillation and started on Cardizem drip.  Cardiology consulted.  Patient has been quite agitated requiring Precedex drip.  Family has been meeting with palliative care to talk about transition to comfort care.  Assessment and Plan: Assessment and Plan: * Acute on chronic respiratory failure with hypoxemia (HCC) Chronic 4L O2 at abseline Felt to be multifactorial secondary to COPD exacerbation and atrial fibrillation with RVR.  Patient already DNR.  She has been refusing BiPAP.  She has had many similar hospitalizations in the past was more amenable to BiPAP.  At this time, due to agitation, requiring Precedex.  Family in discussion with palliative care and are not wanting her to suffer.  Making decision on comfort care within the next 24 hours.  Sepsis has been ruled out.  Procalcitonin level is normal.  Lactic acid level likely secondary to aggressive albuterol treatment.  Severe anxiety --has chronic anxiety and current Rx for ativan --currently having severe anxiety with subjective feeling of not being able to breath (even while sating in high 90's).  IV Ativan, IV haldol and IV morphine did not help, so changed to Precedex drip  Atrial fibrillation with RVR (HCC) Currently on Cardizem drip.  Cardiology following.  Digoxin x1.  IV heparin while patient is too somnolent for Eliquis.  This  may be stopped should patient be made comfort care.  COPD with acute exacerbation ([place]) Continue nebs and IV steroids  CAP (community acquired pneumonia) Chest x-ray with mild right basilar opacity favored to reflect atelectasis and small right pleural effusion.  Initially placed on empiric antibiotics.  These will likely be discontinued when she is comfort care.  Procalcitonin negative  Chronic heart failure with preserved ejection fraction (HFpEF) ([place]) Last echo November 2020 with EF 55 to 60%.  Continue intermittent diuresis .  By cardiology.   Acute metabolic encephalopathy --pt was reportedly found confused on presentation.  Likely due to hypoxia.    Abdominal distension --per pt and family, this is chronic and due to ventral hernia.  No abdominal pain.  KUB benign.  Anemia Hemoglobin 9.5, down from baseline of 10-11 in the setting of chronic anticoagulation Continue to monitor hemoglobin Follow anemia panel  Lactic acidosis Secondary to albuterol.  Sepsis ruled out.  Diabetes mellitus without complication ([place]) Hyperglycemia exacerbated by steroid use --not on insulin at home --ACHS and SSI   History of CVA (cerebrovascular accident) Not currently on statin or ASA per home med  Essential hypertension Borderline low blood pressure       Body mass index is 31.12 kg/m.  Nutrition Problem: Inadequate oral intake Etiology: acute illness     Consultants: Palliative care Cardiology  Procedures: None  Antimicrobials: IV Rocephin and Zithromax 5/29-present  Code Status: DNR   Subjective: Patient currently somnolent on Precedex  Objective: Mild tachypnea Vitals:   07/16/21 1545 07/16/21 1600  BP: 104/79 110/85  Pulse: 76 82  Resp: (!)  27 (!) 21  Temp:    SpO2: 97% 100%    Intake/Output Summary (Last 24 hours) at 07/16/2021 1813 Last data filed at 07/16/2021 1600 Gross per 24 hour  Intake 1000.03 ml  Output 500 ml  Net 500.03 ml   Filed  Weights   07/08/2021 0015  Weight: 84.8 kg   Body mass index is 31.12 kg/m.  Exam:  General: Resting comfortably HEENT: Normocephalic atraumatic, mucous membranes moist Cardiovascular: Irregular rhythm, rate controlled Respiratory: Decreased breath sounds throughout Abdomen: Soft, nontender, distended, hypoactive bowel sounds Musculoskeletal: No clubbing or cyanosis or edema Skin: No skin breaks, tears or lesions Psychiatry: Sedated from Precedex Neurology: Limited exam due to sedation from Precedex  Data Reviewed: Creatinine at 1.10, white count of 16.8 hemoglobin of 9.5 and platelet count of 418  Disposition:  Status is: Inpatient Remains inpatient appropriate because: Determination for comfort care    Anticipated discharge date: Dependent on determination for comfort care  Family Communication: Daughters at the bedside DVT Prophylaxis:    Heparin drip   Author: Annita Brod ,MD 07/16/2021 6:13 PM  To reach On-call, see care teams to locate the attending and reach out via [email or web address]. Between 7PM-7AM, please contact night-coverage If you still have difficulty reaching the attending provider, please page the [facility] (Director on Call) for [facility] on amion for assistance.

## 2021-07-16 NOTE — Progress Notes (Signed)
[place] for heparin infusion initiation and monitoring Indication: atrial fibrillation  Allergies  Allergen Reactions   Lisinopril Cough   Amoxicillin     Patient Measurements: Height: '5\' 5"'$  (165.1 cm) Weight: 84.8 kg (187 lb) IBW/kg (Calculated) : 57 Heparin Dosing Weight: 75.3 kg  Vital Signs: Temp: 97.7 F (36.5 C) (05/30 2000) Temp Source: Oral (05/30 2000) BP: 127/79 (05/31 0000) Pulse Rate: 110 (05/31 0000)  Labs: Recent Labs    07/03/2021 0037 06/23/2021 1636 07/15/21 0610 07/15/21 2359  HGB 9.5*  --  10.3*  --   HCT 34.4*  --  38.6  --   PLT 444*  --  249  --   APTT  --   --   --  64*  CREATININE 0.76  --  0.84  --   TROPONINIHS 11 10  --   --      Estimated Creatinine Clearance: 61.3 mL/min (by C-G formula based on SCr of 0.84 mg/dL).   Medical History: Past Medical History:  Diagnosis Date   A-fib (HCC)    Anxiety    COPD (chronic obstructive pulmonary disease) (HCC)    Cough    CHONIC   Depression    Dysrhythmia    GERD (gastroesophageal reflux disease)    Heart disease    High cholesterol    Hypertension    Hypothyroidism    Orthopnea    Oxygen dependent    2/L CONTINUOUSLY   Stroke (HCC)    Vertigo    Wheezing     Medications:  Scheduled:   Chlorhexidine Gluconate Cloth  6 each Topical Daily   famotidine  20 mg Oral BID   insulin aspart  0-15 Units Subcutaneous TID WC   insulin aspart  0-5 Units Subcutaneous QHS   ipratropium  0.5 mg Nebulization Q6H   levalbuterol  0.63 mg Nebulization Q6H   levothyroxine  200 mcg Oral Q0600   methylPREDNISolone (SOLU-MEDROL) injection  40 mg Intravenous Q12H   nystatin   Topical BID   sertraline  50 mg Oral Daily    Assessment: 76 y.o. female w/ PMH of DM, HTN, CVA, A-fib on apixaban, HFpEF, COPD, chronic respiratory failure on home O2 at 4 L, who was brought to the ED by EMS with shortness of breath and confusion. Because she is not medically cleared for  swallowing, apixaban (last dose 07/08/2021 1002)is being stopped and heparin is being started  Goal of Therapy:  Heparin level 0.3-0.7 units/ml aPTT 66-102 seconds Monitor platelets by anticoagulation protocol: Yes  5/20 2359 aPTT 64, subtherapeutic   Plan:  Give 1100 units bolus x 1 Increase heparin infusion to 900 units/hr Recheck aPTT level in 8 hours after rate change Continue to monitor H&H and platelets  Renda Rolls, PharmD, [facility] 07/16/2021 12:58 AM

## 2021-07-17 DIAGNOSIS — J9621 Acute and chronic respiratory failure with hypoxia: Secondary | ICD-10-CM | POA: Diagnosis not present

## 2021-07-17 DIAGNOSIS — I5032 Chronic diastolic (congestive) heart failure: Secondary | ICD-10-CM | POA: Diagnosis not present

## 2021-07-17 DIAGNOSIS — G9341 Metabolic encephalopathy: Secondary | ICD-10-CM | POA: Diagnosis not present

## 2021-07-17 DIAGNOSIS — I4891 Unspecified atrial fibrillation: Secondary | ICD-10-CM | POA: Diagnosis not present

## 2021-07-17 LAB — CBC
HCT: 34.9 % — ABNORMAL LOW (ref 36.0–46.0)
Hemoglobin: 9.7 g/dL — ABNORMAL LOW (ref 12.0–15.0)
MCH: 19.7 pg — ABNORMAL LOW (ref 26.0–34.0)
MCHC: 27.8 g/dL — ABNORMAL LOW (ref 30.0–36.0)
MCV: 70.8 fL — ABNORMAL LOW (ref 80.0–100.0)
Platelets: 412 10*3/uL — ABNORMAL HIGH (ref 150–400)
RBC: 4.93 MIL/uL (ref 3.87–5.11)
RDW: 20.8 % — ABNORMAL HIGH (ref 11.5–15.5)
WBC: 13.6 10*3/uL — ABNORMAL HIGH (ref 4.0–10.5)
nRBC: 1.5 % — ABNORMAL HIGH (ref 0.0–0.2)

## 2021-07-17 LAB — APTT: aPTT: 42 seconds — ABNORMAL HIGH (ref 24–36)

## 2021-07-17 LAB — BASIC METABOLIC PANEL
Anion gap: 6 (ref 5–15)
BUN: 45 mg/dL — ABNORMAL HIGH (ref 8–23)
CO2: 27 mmol/L (ref 22–32)
Calcium: 8.7 mg/dL — ABNORMAL LOW (ref 8.9–10.3)
Chloride: 113 mmol/L — ABNORMAL HIGH (ref 98–111)
Creatinine, Ser: 0.97 mg/dL (ref 0.44–1.00)
GFR, Estimated: >60 mL/min (ref >60)
Glucose, Bld: 133 mg/dL — ABNORMAL HIGH (ref 70–99)
Potassium: 4.7 mmol/L (ref 3.5–5.1)
Sodium: 146 mmol/L — ABNORMAL HIGH (ref 135–145)

## 2021-07-17 LAB — MAGNESIUM: Magnesium: 2.6 mg/dL — ABNORMAL HIGH (ref 1.7–2.4)

## 2021-07-17 LAB — GLUCOSE, CAPILLARY: Glucose-Capillary: 122 mg/dL — ABNORMAL HIGH (ref 70–99)

## 2021-07-17 MED ORDER — HALOPERIDOL LACTATE 2 MG/ML PO CONC: 0.5000 mg | ORAL | Status: DC | PRN

## 2021-07-17 MED ORDER — POLYVINYL ALCOHOL 1.4 % OP SOLN: 1.0000 [drp] | Freq: Four times a day (QID) | OPHTHALMIC | Status: DC | PRN

## 2021-07-17 MED ORDER — GLYCOPYRROLATE 0.2 MG/ML IJ SOLN
0.2000 mg | INTRAMUSCULAR | Status: DC | PRN
  Administered 2021-07-17 – 2021-07-19 (×7): 0.2 mg via INTRAVENOUS
  Filled 2021-07-17 (×6): qty 1

## 2021-07-17 MED ORDER — HALOPERIDOL 0.5 MG PO TABS: 0.5000 mg | ORAL_TABLET | ORAL | Status: DC | PRN

## 2021-07-17 MED ORDER — BIOTENE DRY MOUTH MT LIQD: 15.0000 mL | OROMUCOSAL | Status: DC | PRN

## 2021-07-17 MED ORDER — HALOPERIDOL LACTATE 5 MG/ML IJ SOLN
0.5000 mg | INTRAMUSCULAR | Status: DC | PRN
  Administered 2021-07-18 – 2021-07-19 (×3): 0.5 mg via INTRAVENOUS
  Filled 2021-07-17 (×4): qty 1

## 2021-07-17 MED ORDER — ONDANSETRON HCL 4 MG/2ML IJ SOLN: 4.0000 mg | Freq: Four times a day (QID) | INTRAMUSCULAR | Status: DC | PRN

## 2021-07-17 MED ORDER — ONDANSETRON 4 MG PO TBDP: 4.0000 mg | ORAL_TABLET | Freq: Four times a day (QID) | ORAL | Status: DC | PRN

## 2021-07-17 MED ORDER — GLYCOPYRROLATE 0.2 MG/ML IJ SOLN
0.2000 mg | INTRAMUSCULAR | Status: DC | PRN
  Filled 2021-07-17: qty 1

## 2021-07-17 MED ORDER — GLYCOPYRROLATE 1 MG PO TABS: 1.0000 mg | ORAL_TABLET | ORAL | Status: DC | PRN

## 2021-07-17 NOTE — Progress Notes (Signed)
Today the patient was transitioned to comfort care. VSS. See eMAR for precedex titration and PRN med utilization. Patient's son Legrand Como, and daughter-in-law Elmyra Ricks, came to visit around 2045. Provided support to patient and family. All questions and concerns were addressed and answered.

## 2021-07-17 NOTE — Progress Notes (Signed)
SLP Cancellation Note  Patient Details Name: Summer Bradley MRN: [id number] DOB: 1945/03/14   Cancelled treatment:       Reason Eval/Treat Not Completed:  (chart reviewed) Patient status changed to comfort care by family. ST services will sign off at this time.      Orinda Kenner, MS, CCC-SLP Speech Language Pathologist Rehab Services; [place] [phone] (ascom) Maycee Blasco 07/17/2021, 4:19 PM

## 2021-07-17 NOTE — Progress Notes (Signed)
[place] for heparin infusion initiation and monitoring Indication: atrial fibrillation  Allergies  Allergen Reactions   Lisinopril Cough   Amoxicillin     Patient Measurements: Height: '5\' 5"'$  (165.1 cm) Weight: 84.8 kg (187 lb) IBW/kg (Calculated) : 57 Heparin Dosing Weight: 75.3 kg  Vital Signs: Temp: 98 F (36.7 C) (06/01 0000) Temp Source: Oral (06/01 0000) BP: 127/80 (06/01 0600) Pulse Rate: 79 (06/01 0600)  Labs: Recent Labs     0000 06/28/2021 1636 07/15/21 0610 07/15/21 2359 07/16/21 0440 07/16/21 0921 07/16/21 2003 07/17/21 0322 07/17/21 0608  HGB   < >  --  10.3*  --  9.5*  --   --  9.7*  --   HCT  --   --  38.6  --  33.8*  --   --  34.9*  --   PLT  --   --  249  --  418*  --   --  412*  --   APTT  --   --   --    < >  --  52* 137*  --  42*  HEPARINUNFRC  --   --   --   --   --  >1.10*  --   --   --   CREATININE  --   --  0.84  --  1.10*  --   --  0.97  --   TROPONINIHS  --  10  --   --   --   --   --   --   --    < > = values in this interval not displayed.     Estimated Creatinine Clearance: 53 mL/min (by C-G formula based on SCr of 0.97 mg/dL).   Medical History: Past Medical History:  Diagnosis Date   A-fib ([place])    Anxiety    COPD (chronic obstructive pulmonary disease) (HCC)    Cough    CHONIC   Depression    Dysrhythmia    GERD (gastroesophageal reflux disease)    Heart disease    High cholesterol    Hypertension    Hypothyroidism    Orthopnea    Oxygen dependent    2/L CONTINUOUSLY   Stroke (HCC)    Vertigo    Wheezing     Medications:  Scheduled:   Chlorhexidine Gluconate Cloth  6 each Topical Daily   famotidine  20 mg Oral BID   insulin aspart  0-15 Units Subcutaneous Q6H   ipratropium  0.5 mg Nebulization Q6H   levalbuterol  0.63 mg Nebulization Q6H   levothyroxine  200 mcg Oral Q0600   methylPREDNISolone (SOLU-MEDROL) injection  40 mg Intravenous Q12H   nystatin   Topical BID    sertraline  50 mg Oral Daily    Assessment: 76 y.o. female w/ PMH of DM, HTN, CVA, A-fib on apixaban, HFpEF, COPD, chronic respiratory failure on home O2 at 4 L, who was brought to the ED by EMS with shortness of breath and confusion. Because she is not medically cleared for swallowing, apixaban (last dose 07/16/2021 1002)is being stopped and heparin was started  Goal of Therapy:  Heparin level 0.3-0.7 units/ml aPTT 66-102 seconds Monitor platelets by anticoagulation protocol: Yes   Plan:  Increase  heparin infusion rate to 1100 units/hr Recheck aPTT level in 8 hours after rate change Continue to monitor H&H and platelets  Dallie Piles, PharmD, BCPS Clinical Pharmacist   07/17/2021 7:10 AM

## 2021-07-17 NOTE — Progress Notes (Signed)
[facility] Progress Note  Patient: Summer Bradley    [id number]:[id number]  DOA: 07/10/2021    Date of Service: the patient was seen and examined on 07/17/2021  Brief hospital course: 76 year old female with past medical history of diabetes, hypertension, COPD with chronic respiratory failure on 4 L, chronic systolic heart failure and atrial fibrillation who presented to the emergency room on 5/29 with shortness of breath and confusion.  Patient found to have acute respiratory failure secondary to pneumonia as well as sepsis and started on IV fluids and antibiotics.  Patient also found to be in rapid atrial fibrillation and started on Cardizem drip.  Cardiology consulted.  Patient has been quite agitated requiring Precedex drip.  Patient has had previous hospitalizations before requiring BiPAP and with overall decline, family had meetings with palliative care and on 6/1, patient's daughters opted to make her comfort care.  Assessment and Plan: Assessment and Plan: * Acute on chronic respiratory failure with hypoxemia (HCC) Chronic 4L O2 at abseline Felt to be multifactorial secondary to COPD exacerbation and atrial fibrillation with RVR.  Patient already DNR.  She has been refusing BiPAP.  She has had many similar hospitalizations in the past was more amenable to BiPAP.  At this time, due to agitation, requiring Precedex.  Family in discussion with palliative care and are not wanting her to suffer.  Sepsis has been ruled out.  Procalcitonin level is normal.  Lactic acid level likely secondary to aggressive albuterol treatment.  Patient now made comfort care.  Antibiotics and steroids have been discontinued.  Severe anxiety --has chronic anxiety and current Rx for ativan --currently having severe anxiety with subjective feeling of not being able to breath (even while sating in high 90's).  IV Ativan, IV haldol and IV morphine did not help, so changed to Precedex drip.  She is currently tolerating  Precedex drip which is preventing agitation, but she is still able to interact somewhat with her daughters.  Occasional as needed Ativan needed.  We will continue this.  We will see if we can give Precedex drip on the floor since she is comfort care at stable rate.  Atrial fibrillation with RVR [facility]) Cardiology had been following.  Digoxin x1.  IV heparin and Cardizem drip.  Heparin discontinued once patient was made comfort care.  Cardizem drip changed to as needed Cardizem push  COPD with acute exacerbation ([place]) Previously on nebulizers and steroids.  Steroids discontinued and that she is comfort care  CAP (community acquired pneumonia) Chest x-ray with mild right basilar opacity favored to reflect atelectasis and small right pleural effusion.  Initially placed on empiric antibiotics.  Discontinued once patient was made comfort care.  Procalcitonin negative  Chronic heart failure with preserved ejection fraction (HFpEF) ([place]) Last echo November 2020 with EF 55 to 60%.  No further diuresis since she is now comfort care.  Acute metabolic encephalopathy --pt was reportedly found confused on presentation.  Likely due to hypoxia.    Abdominal distension --per pt and family, this is chronic and due to ventral hernia.  No abdominal pain.  KUB benign.  Anemia Hemoglobin 9.5, down from baseline of 10-11 in the setting of chronic anticoagulation Continue to monitor hemoglobin Follow anemia panel  Lactic acidosis Secondary to albuterol.  Sepsis ruled out.  Diabetes mellitus without complication ([place]) Hyperglycemia exacerbated by steroid use --not on insulin at home Sliding scale insulin initially, discontinued after made comfort care   History of CVA (cerebrovascular accident) Not currently on statin  or ASA per home med  Essential hypertension Borderline low blood pressure       Body mass index is 31.12 kg/m.  Nutrition Problem: Inadequate oral intake Etiology: acute illness      Consultants: Palliative care Cardiology  Procedures: None  Antimicrobials: IV Rocephin and Zithromax 5/29-6/1   Code Status: DNR, now comfort care   Subjective: Patient currently resting comfortably  Objective: Mild tachypnea Vitals:   07/17/21 1100 07/17/21 1200  BP: 102/69   Pulse: 79 74  Resp: (!) 23 (!) 22  Temp:    SpO2: 98% 100%    Intake/Output Summary (Last 24 hours) at 07/17/2021 1548 Last data filed at 07/17/2021 1300 Gross per 24 hour  Intake 377.38 ml  Output 600 ml  Net -222.62 ml    Filed Weights   07/13/2021 0015  Weight: 84.8 kg   Body mass index is 31.12 kg/m.  Exam:  General: Resting comfortably Cardiovascular: Irregular rhythm, rate controlled Respiratory: Decreased breath sounds throughout  Data Reviewed: Labs today note sodium trending upward to 146 due to poor p.o. intake.  Creatinine normal.  Further labs to be discontinued for comfort care.  Disposition:  Status is: Inpatient Remains inpatient appropriate because: Actively dying    Anticipated discharge date: Patient may pass in the hospital in the next few days  Family Communication: Daughters at the bedside DVT Prophylaxis: Discontinued for comfort care   Author: Annita Brod ,MD 07/17/2021 3:48 PM  To reach On-call, see care teams to locate the attending and reach out via [email or web address]. Between 7PM-7AM, please contact night-coverage If you still have difficulty reaching the attending provider, please page the [facility] (Director on Call) for [facility] on amion for assistance.

## 2021-07-17 DEATH — deceased

## 2021-07-18 DIAGNOSIS — I5032 Chronic diastolic (congestive) heart failure: Secondary | ICD-10-CM | POA: Diagnosis not present

## 2021-07-18 DIAGNOSIS — G9341 Metabolic encephalopathy: Secondary | ICD-10-CM | POA: Diagnosis not present

## 2021-07-18 DIAGNOSIS — I4891 Unspecified atrial fibrillation: Secondary | ICD-10-CM | POA: Diagnosis not present

## 2021-07-18 DIAGNOSIS — J9621 Acute and chronic respiratory failure with hypoxia: Secondary | ICD-10-CM | POA: Diagnosis not present

## 2021-07-18 MED ORDER — LORAZEPAM 2 MG/ML IJ SOLN
2.0000 mg | Freq: Three times a day (TID) | INTRAMUSCULAR | Status: DC | PRN
  Administered 2021-07-18 – 2021-07-19 (×4): 2 mg via INTRAVENOUS
  Filled 2021-07-18 (×5): qty 1

## 2021-07-18 MED ORDER — SCOPOLAMINE 1 MG/3DAYS TD PT72
1.0000 | MEDICATED_PATCH | TRANSDERMAL | Status: DC
  Administered 2021-07-18: 1.5 mg via TRANSDERMAL
  Filled 2021-07-18: qty 1

## 2021-07-18 MED ORDER — METOPROLOL TARTRATE 5 MG/5ML IV SOLN
5.0000 mg | Freq: Four times a day (QID) | INTRAVENOUS | Status: DC | PRN
  Administered 2021-07-19: 5 mg via INTRAVENOUS
  Filled 2021-07-18: qty 5

## 2021-07-18 NOTE — Progress Notes (Signed)
Manufacturing engineer [facility]) Hospital Liaison Note  Received request from [facility] for interest in [place]. MSW confirmed with both attending/Dr. [place] Pink & TOC/Ashley for referral to proceed; both in agreement. Visited patient at bedside and spoke with daughter/Angela to confirm interest and explain services.  At this time, family does not want for patient to transition to the [facility] and have requested a hospital death. Family voiced concerns that patient would not be safe for transfer. MSW voiced understanding and notified MD & TOC of above information.   Per family request, patient is not under review for [facility] services. [facility] to sign off but is available to reengage if family chooses to pursue transfer.  Please do not hesitate to call with any hospice related questions.    Thank you for the opportunity to participate in this patient's care.  Daphene Calamity, MSW [facility] Liaison  [phone]

## 2021-07-18 NOTE — Progress Notes (Signed)
[facility] Progress Note  Patient: Summer Bradley    JJO:[id number]  DOA: 07/08/2021    Date of Service: the patient was seen and examined on 07/18/2021  Brief hospital course: 76 year old female with past medical history of diabetes, hypertension, COPD with chronic respiratory failure on 4 L, chronic systolic heart failure and atrial fibrillation who presented to the emergency room on 5/29 with shortness of breath and confusion.  Patient found to have acute respiratory failure secondary to pneumonia as well as sepsis and started on IV fluids and antibiotics.  Patient also found to be in rapid atrial fibrillation and started on Cardizem drip.  Cardiology consulted.  Patient has been quite agitated requiring Precedex drip.  Patient has had previous hospitalizations before requiring BiPAP and with overall decline, family had meetings with palliative care and on 6/1, patient's daughters opted to make her comfort care.  Assessment and Plan: Assessment and Plan: * Acute on chronic respiratory failure with hypoxemia (HCC) Chronic 4L O2 at abseline Felt to be multifactorial secondary to COPD exacerbation and atrial fibrillation with RVR.  Patient already DNR.  She has been refusing BiPAP.  She has had many similar hospitalizations in the past was more amenable to BiPAP.  At this time, due to agitation, requiring Precedex.  Family in discussion with palliative care and are not wanting her to suffer.  Sepsis has been ruled out.  Procalcitonin level is normal.  Lactic acid level likely secondary to aggressive albuterol treatment.  Patient was made comfort care on 6/1.  Antibiotics and steroids discontinued.  Increased as needed Ativan dose.  Scopolamine patch added for secretions  Severe anxiety --has chronic anxiety and current Rx for ativan --currently having severe anxiety with subjective feeling of not being able to breath (even while sating in high 90's).  IV Ativan, IV haldol and IV morphine  did not help, so changed to Precedex drip.  She is currently tolerating Precedex drip which is preventing agitation, but she is still able to interact somewhat with her daughters.  Occasional as needed Ativan needed.  We will continue this.  We will see if we can give Precedex drip on the floor since she is comfort care at stable rate.  Atrial fibrillation with RVR [facility]) Cardiology had been following.  Digoxin x1.  IV heparin and Cardizem drip.  Heparin discontinued once patient was made comfort care.  Cardizem drip changed to as needed Lopressor although with the Precedex keeping her calm, heart rate has been stable  COPD with acute exacerbation (HCC) Previously on nebulizers and steroids.  Steroids discontinued and that she is comfort care  CAP (community acquired pneumonia) Chest x-ray with mild right basilar opacity favored to reflect atelectasis and small right pleural effusion.  Initially placed on empiric antibiotics.  Discontinued once patient was made comfort care.  Procalcitonin negative  Chronic heart failure with preserved ejection fraction (HFpEF) ([place]) Last echo November 2020 with EF 55 to 60%.  No further diuresis since she is now comfort care.  Acute metabolic encephalopathy --pt was reportedly found confused on presentation.  Likely due to hypoxia.    Abdominal distension --per pt and family, this is chronic and due to ventral hernia.  No abdominal pain.  KUB benign.  Anemia Hemoglobin 9.5, down from baseline of 10-11 in the setting of chronic anticoagulation Continue to monitor hemoglobin Follow anemia panel  Lactic acidosis Secondary to albuterol.  Sepsis ruled out.  Diabetes mellitus without complication ([place]) Hyperglycemia exacerbated by steroid use --not on  insulin at home Sliding scale insulin initially, discontinued after made comfort care   History of CVA (cerebrovascular accident) Not currently on statin or ASA per home med  Essential  hypertension Borderline low blood pressure       Body mass index is 31.12 kg/m.  Nutrition Problem: Inadequate oral intake Etiology: acute illness     Consultants: Palliative care Cardiology  Procedures: None  Antimicrobials: IV Rocephin and Zithromax 5/29-6/1   Code Status: DNR, now comfort care   Subjective: Patient currently resting comfortably, able to awaken to voice.  No complaints.  Objective: Mild tachypnea Vitals:   07/18/21 1000 07/18/21 1100  BP:    Pulse:    Resp: (!) 23 (!) 22  Temp:    SpO2:      Intake/Output Summary (Last 24 hours) at 07/18/2021 1310 Last data filed at 07/18/2021 1132 Gross per 24 hour  Intake 483.18 ml  Output 250 ml  Net 233.18 ml    Filed Weights   07/10/2021 0015  Weight: 84.8 kg   Body mass index is 31.12 kg/m.  Exam:  General: Resting comfortably Cardiovascular: Irregular rhythm, rate controlled Respiratory: Decreased breath sounds throughout  Data Reviewed: Patient now comfort care, no further labs  Disposition:  Status is: Inpatient Remains inpatient appropriate because: Actively dying    Anticipated discharge date: Patient may pass in the hospital in the next few days.  If she does not, we will look into transferring to hospice facility.  Family Communication: Daughters at the bedside DVT Prophylaxis: Discontinued for comfort care   Author: Annita Brod ,MD 07/18/2021 1:10 PM  To reach On-call, see care teams to locate the attending and reach out via [email or web address]. Between 7PM-7AM, please contact night-coverage If you still have difficulty reaching the attending provider, please page the [facility] (Director on Call) for [facility] on amion for assistance.

## 2021-07-18 NOTE — Plan of Care (Signed)
Continuing with plan of care. 

## 2021-07-18 NOTE — Progress Notes (Signed)
Patient continues on comfort care with family at bedside.

## 2021-07-19 DIAGNOSIS — I4891 Unspecified atrial fibrillation: Secondary | ICD-10-CM | POA: Diagnosis not present

## 2021-07-19 DIAGNOSIS — I5032 Chronic diastolic (congestive) heart failure: Secondary | ICD-10-CM | POA: Diagnosis not present

## 2021-07-19 DIAGNOSIS — J9621 Acute and chronic respiratory failure with hypoxia: Secondary | ICD-10-CM | POA: Diagnosis not present

## 2021-07-19 DIAGNOSIS — J441 Chronic obstructive pulmonary disease with (acute) exacerbation: Secondary | ICD-10-CM | POA: Diagnosis not present

## 2021-07-19 LAB — CULTURE, BLOOD (SINGLE): Culture: NO GROWTH

## 2021-08-16 NOTE — Progress Notes (Signed)
[facility] Progress Note  Patient: Summer Bradley    WUJ:[id number]  DOA: 06/20/2021    Date of Service: the patient was seen and examined on 08/05/2021  Brief hospital course: 76 year old female with past medical history of diabetes, hypertension, COPD with chronic respiratory failure on 4 L, chronic systolic heart failure and atrial fibrillation who presented to the emergency room on 5/29 with shortness of breath and confusion.  Patient found to have acute respiratory failure secondary to pneumonia as well as sepsis and started on IV fluids and antibiotics.  Patient also found to be in rapid atrial fibrillation and started on Cardizem drip.  Cardiology consulted.  Patient has been quite agitated requiring Precedex drip.  Patient has had previous hospitalizations before requiring BiPAP and with overall decline, family had meetings with palliative care and on 6/1, patient's daughters opted to make her comfort care.  Assessment and Plan: Assessment and Plan: * Acute on chronic respiratory failure with hypoxemia (HCC) Chronic 4L O2 at abseline Felt to be multifactorial secondary to COPD exacerbation and atrial fibrillation with RVR.  Patient already DNR.  She has been refusing BiPAP.  She has had many similar hospitalizations in the past was more amenable to BiPAP.  At this time, due to agitation, requiring Precedex.  Family in discussion with palliative care and are not wanting her to suffer.  Sepsis has been ruled out.  Procalcitonin level is normal.  Lactic acid level likely secondary to aggressive albuterol treatment.  Patient was made comfort care on 6/1.  Antibiotics and steroids discontinued.  Increased as needed Ativan dose.  Scopolamine patch added for secretions.  Respiratory rate increasing and becoming less responsive even with Precedex being decreased.  She is likely getting closer to to the end.  Severe anxiety --has chronic anxiety and current Rx for ativan --currently having  severe anxiety with subjective feeling of not being able to breath (even while sating in high 90's).  IV Ativan, IV haldol and IV morphine did not help, so changed to Precedex drip.  She is currently tolerating Precedex drip which is preventing agitation, but she is still able to interact somewhat with her daughters.  Occasional as needed Ativan needed.  We will continue this.  Precedex drip not allowed on floor.  Atrial fibrillation with RVR [facility]) Cardiology had been following.  Digoxin x1.  IV heparin and Cardizem drip.  Heparin discontinued once patient was made comfort care.  Cardizem drip changed to as needed Lopressor.  Heart rate trending upward slightly  COPD with acute exacerbation (HCC) Previously on nebulizers and steroids.  Steroids discontinued and that she is comfort care  CAP (community acquired pneumonia) Chest x-ray with mild right basilar opacity favored to reflect atelectasis and small right pleural effusion.  Initially placed on empiric antibiotics.  Discontinued once patient was made comfort care.  Procalcitonin negative  Chronic heart failure with preserved ejection fraction (HFpEF) ([place]) Last echo November 2020 with EF 55 to 60%.  No further diuresis since she is now comfort care.  Acute metabolic encephalopathy --pt was reportedly found confused on presentation.  Likely due to hypoxia.    Abdominal distension --per pt and family, this is chronic and due to ventral hernia.  No abdominal pain.  KUB benign.  Anemia Hemoglobin 9.5, down from baseline of 10-11 in the setting of chronic anticoagulation Continue to monitor hemoglobin Follow anemia panel  Lactic acidosis Secondary to albuterol.  Sepsis ruled out.  Diabetes mellitus without complication ([place]) Hyperglycemia exacerbated by steroid  use --not on insulin at home Sliding scale insulin initially, discontinued after made comfort care   History of CVA (cerebrovascular accident) Not currently on statin or ASA  per home med  Essential hypertension Borderline low blood pressure       Body mass index is 31.12 kg/m.  Nutrition Problem: Inadequate oral intake Etiology: acute illness     Consultants: Palliative care Cardiology  Procedures: None  Antimicrobials: IV Rocephin and Zithromax 5/29-6/1   Code Status: DNR, now comfort care   Subjective: Patient less responsive  Objective: Mild tachypnea Vitals:   07-31-21 1118 Jul 31, 2021 1200  BP:    Pulse:    Resp:  (!) 30  Temp:    SpO2: (!) 84% (!) 85%    Intake/Output Summary (Last 24 hours) at Jul 31, 2021 1426 Last data filed at 31-Jul-2021 0600 Gross per 24 hour  Intake 271.22 ml  Output 150 ml  Net 121.22 ml    Filed Weights   07/02/2021 0015  Weight: 84.8 kg   Body mass index is 31.12 kg/m.  Exam:  General: Resting comfortably Cardiovascular: Irregular rhythm, rate controlled Respiratory: Decreased breath sounds throughout, breathing more labored  Data Reviewed: Patient now comfort care, no further labs  Disposition:  Status is: Inpatient Remains inpatient appropriate because: Actively dying    Anticipated discharge date: I expect patient will pass in the next 24 hours  Family Communication: Daughters at the bedside DVT Prophylaxis: Discontinued for comfort care   Author: Annita Brod ,MD 2021/07/31 2:26 PM  To reach On-call, see care teams to locate the attending and reach out via [email or web address]. Between 7PM-7AM, please contact night-coverage If you still have difficulty reaching the attending provider, please page the [facility] (Director on Call) for [facility] on amion for assistance.

## 2021-08-16 NOTE — Death Summary Note (Signed)
DEATH SUMMARY   Patient Details  Name: Summer Bradley MRN: [id number] DOB: Jun 21, 1945 [id number] Admission/Discharge Information   Admit Date:  2021-08-10  Date of Death: Date of Death: Aug 15, 2021  Time of Death: Time of Death: 05-28-1519  Length of Stay: 5   Principle Cause of death: COPD exacerbation  Hospital Diagnoses: Principal Problem:   Acute on chronic respiratory failure with hypoxemia ([place]) Active Problems:   COPD with acute exacerbation ([place])   Atrial fibrillation with RVR (HCC)   CAP (community acquired pneumonia)   Chronic heart failure with preserved ejection fraction (HFpEF) (HCC)   Severe anxiety   Essential hypertension   History of CVA (cerebrovascular accident)   Diabetes mellitus without complication ([place])   Lactic acidosis   Anemia   Abdominal distension   Acute metabolic encephalopathy   Hospital Course: 76 year old female with past medical history of diabetes, hypertension, COPD with chronic respiratory failure on 4 L, chronic systolic heart failure and atrial fibrillation who presented to the emergency room on 08/11/2022 with shortness of breath and confusion.  Patient found to have acute respiratory failure secondary to pneumonia as well as sepsis and started on IV fluids and antibiotics.  Patient also found to be in rapid atrial fibrillation and started on Cardizem drip.  Cardiology consulted.  Patient has been quite agitated requiring Precedex drip.  Patient has had previous hospitalizations before requiring BiPAP and with overall decline, family had meetings with palliative care and on 6/1, patient's daughters opted to make her comfort care. Patient passed away at 3:21 PM on 08/16/22.  Assessment and Plan: * Acute on chronic respiratory failure with hypoxemia (HCC) Chronic 4L O2 at abseline [place] to be multifactorial secondary to COPD exacerbation and atrial fibrillation with RVR.  Patient already DNR.  She has been refusing BiPAP.  She  has had many similar hospitalizations in the past was more amenable to BiPAP.  At this time, due to agitation, requiring Precedex.  Family in discussion with palliative care and are not wanting her to suffer.  Sepsis has been ruled out.  Procalcitonin level is normal.  Lactic acid level likely secondary to aggressive albuterol treatment.  Patient was made comfort care on 6/1.  Antibiotics and steroids discontinued.  She was provided supportive treatment and passed away on 08-16-22.  Family present.  COPD with acute exacerbation (HCC) Previously on nebulizers and steroids.  Steroids discontinued after she was made comfort care.  CAP (community acquired pneumonia) Chest x-ray with mild right basilar opacity favored to reflect atelectasis and small right pleural effusion.  Initially placed on empiric antibiotics.  Discontinued once patient was made comfort care.  Procalcitonin negative  Atrial fibrillation with RVR [facility]) [facility] cardiology had been following.  Digoxin x1.  IV heparin and Cardizem drip.  Heparin discontinued once patient was made comfort care.  Cardizem drip changed to as needed Lopressor.   Severe anxiety -- Had chronic anxiety and current Rx for ativan -Was having severe anxiety with subjective feeling of not being able to breath (even while sating in high 90's).  IV Ativan, IV haldol and IV morphine did not help, so changed to Precedex drip.  She was currently tolerating Precedex drip which is preventing agitation, but she was still able to interact somewhat with her daughters.  Occasional as needed Ativan needed.    Chronic heart failure with preserved ejection fraction (HFpEF) ([place]) Last echo November 2020 with EF 55 to 60%.  No further diuresis once she was made  comfort care  Acute metabolic encephalopathy --pt was reportedly found confused on presentation.  Likely due to hypoxia.    Abdominal distension --per pt and family, this was chronic and due to ventral hernia.  No abdominal  pain.  KUB benign.  Anemia Hemoglobin 9.5, down from baseline of 10-11 in the setting of chronic anticoagulation.  Labs stopped after she was made comfort care.  Lactic acidosis Secondary to albuterol.  Sepsis ruled out.  Diabetes mellitus without complication ([place]) Hyperglycemia exacerbated by steroid use --not on insulin at home Sliding scale insulin initially, discontinued after made comfort care   History of CVA (cerebrovascular accident) Not currently on statin or ASA per home med  Essential hypertension Borderline low blood pressure         Consultants: Palliative care Cardiology   Procedures: None  The results of significant diagnostics from this hospitalization (including imaging, microbiology, ancillary and laboratory) are listed below for reference.   Significant Diagnostic Studies: ECHOCARDIOGRAM COMPLETE  Result Date: 07/15/2021    ECHOCARDIOGRAM REPORT   Patient Name:   Summer Bradley Date of Exam: 07/15/2021 Medical Rec #:  [id number]          Height:       65.0 in Accession #:    [id number]         Weight:       187.0 lb Date of Birth:  03-Feb-1946         BSA:          1.922 m Patient Age:    76 years           BP:           88/71 mmHg Patient Gender: F                  HR:           80 bpm. Exam Location:  Inpatient Procedure: 2D Echo, Cardiac Doppler, Color Doppler and Intracardiac            Opacification Agent Indications:     [place] respiratory distress R06.03  History:         Patient has prior history of Echocardiogram examinations, most                  recent 12/25/2018. Stroke and COPD, Arrythmias:Atrial                  Fibrillation; Risk Factors:Hypertension, Former Smoker and                  Dyslipidemia. Thyroid disease.  Sonographer:     Darlina Sicilian RDCS Referring Phys:  [id number] [place] Diagnosing Phys: Nelva Bush MD IMPRESSIONS  1. Left ventricular ejection fraction, by estimation, is 45 to 50%. The left ventricle has mildly decreased  function. The left ventricle demonstrates global hypokinesis. Left ventricular diastolic parameters are indeterminate.  2. Right ventricular systolic function is mildly reduced. The right ventricular size is normal. There is moderately elevated pulmonary artery systolic pressure.  3. Left atrial size was mildly dilated.  4. Right atrial size was mildly dilated.  5. The mitral valve is normal in structure. Moderate to severe mitral valve regurgitation.  6. Tricuspid valve regurgitation is moderate to severe.  7. The aortic valve is tricuspid. There is mild thickening of the aortic valve. Aortic valve regurgitation is not visualized. Aortic valve sclerosis is present, with no evidence of aortic valve stenosis.  8. The inferior vena cava is dilated  in size with <50% respiratory variability, suggesting right atrial pressure of 15 mmHg. FINDINGS  Left Ventricle: Left ventricular ejection fraction, by estimation, is 45 to 50%. The left ventricle has mildly decreased function. The left ventricle demonstrates global hypokinesis. Definity contrast agent was given IV to delineate the left ventricular  endocardial borders. The left ventricular internal cavity size was normal in size. There is no left ventricular hypertrophy. Left ventricular diastolic parameters are indeterminate. Right Ventricle: The right ventricular size is normal. No increase in right ventricular wall thickness. Right ventricular systolic function is mildly reduced. There is moderately elevated pulmonary artery systolic pressure. The tricuspid regurgitant velocity is 2.78 m/s, and with an assumed right atrial pressure of 15 mmHg, the estimated right ventricular systolic pressure is 13.2 mmHg. Left Atrium: Left atrial size was mildly dilated. Right Atrium: Right atrial size was mildly dilated. Pericardium: The pericardium was not well visualized. Mitral Valve: The mitral valve is normal in structure. Moderate to severe mitral valve regurgitation. Tricuspid  Valve: The tricuspid valve is not well visualized. Tricuspid valve regurgitation is moderate to severe. Aortic Valve: The aortic valve is tricuspid. There is mild thickening of the aortic valve. There is mild aortic valve annular calcification. Aortic valve regurgitation is not visualized. Aortic valve sclerosis is present, with no evidence of aortic valve  stenosis. Pulmonic Valve: The pulmonic valve was not well visualized. Pulmonic valve regurgitation is mild. Aorta: The aortic root and ascending aorta are structurally normal, with no evidence of dilitation. Pulmonary Artery: The pulmonary artery is of normal size. Venous: The inferior vena cava is dilated in size with less than 50% respiratory variability, suggesting right atrial pressure of 15 mmHg. IAS/Shunts: The interatrial septum was not well visualized.  LEFT VENTRICLE PLAX 2D LVIDd:         4.90 cm     Diastology LVIDs:         3.60 cm     LV e' medial:    9.03 cm/s LV PW:         0.90 cm     LV E/e' medial:  12.6 LV IVS:        0.90 cm     LV e' lateral:   10.33 cm/s LVOT diam:     1.90 cm     LV E/e' lateral: 11.0 LV SV:         30 LV SV Index:   16 LVOT Area:     2.84 cm  LV Volumes (MOD) LV vol d, MOD A2C: 68.7 ml LV vol d, MOD A4C: 62.7 ml LV vol s, MOD A2C: 28.0 ml LV vol s, MOD A4C: 33.7 ml LV SV MOD A2C:     40.7 ml LV SV MOD A4C:     62.7 ml LV SV MOD BP:      35.2 ml RIGHT VENTRICLE RV S prime:     7.80 cm/s TAPSE (M-mode): 1.1 cm LEFT ATRIUM             Index        RIGHT ATRIUM           Index LA diam:        4.10 cm 2.13 cm/m   RA Area:     18.90 cm LA Vol (A2C):   65.2 ml 33.92 ml/m  RA Volume:   46.60 ml  24.24 ml/m LA Vol (A4C):   73.5 ml 38.23 ml/m LA Biplane Vol: 71.4 ml 37.14 ml/m  AORTIC VALVE LVOT Vmax:  58.70 cm/s LVOT Vmean:  39.100 cm/s LVOT VTI:    0.107 m  AORTA Ao Root diam: 3.00 cm Ao Asc diam:  3.40 cm MITRAL VALVE                  TRICUSPID VALVE MV Area (PHT): 5.44 cm       TR Peak grad:   30.9 mmHg MV Decel Time:  139 msec       TR Vmax:        278.00 cm/s MR Peak grad:    85.7 mmHg MR Mean grad:    56.0 mmHg    SHUNTS MR Vmax:         463.00 cm/s  Systemic VTI:  0.11 m MR Vmean:        353.0 cm/s   Systemic Diam: 1.90 cm MR PISA:         1.57 cm MR PISA Eff ROA: 14 mm MR PISA Radius:  0.50 cm MV E velocity: 114.00 cm/s Nelva Bush MD Electronically signed by Nelva Bush MD Signature Date/Time: 07/15/2021/7:03:29 PM    Final    DG Abd 1 View  Result Date: 07/11/2021 CLINICAL DATA:  Distension EXAM: ABDOMEN - 1 VIEW COMPARISON:  03/03/2014 FINDINGS: Nonobstructive bowel gas pattern. Chronic elevation of the right hemidiaphragm. Atherosclerotic vascular calcifications. No acute bony findings. IMPRESSION: Nonobstructive bowel gas pattern. Electronically Signed   By: Davina Poke D.O.   On: 07/13/2021 17:29   DG Chest Portable 1 View  Result Date: 07/12/2021 CLINICAL DATA:  Shortness of breath EXAM: PORTABLE CHEST 1 VIEW COMPARISON:  02/26/2021 FINDINGS: Mild right basilar opacity, favored to reflect a combination of atelectasis and small right pleural effusion. Hazy opacity overlying the left hemithorax favors soft tissue rather than a pulmonary opacity. The heart is top-normal in size.  Thoracic aortic atherosclerosis. Degenerative changes of the right shoulder. IMPRESSION: Mild right basilar opacity, favored to reflect a combination of atelectasis and small right pleural effusion. Electronically Signed   By: Julian Hy M.D.   On: 06/18/2021 01:01    Microbiology: No results found for this or any previous visit (from the past 240 hour(s)).  Time spent: 25 minutes  Signed: Annita Brod, MD 08-02-21

## 2021-08-16 NOTE — Progress Notes (Addendum)
1205 Family at bedside. Oxygen discontinued. Naso tracheal suctioning done x 3. Given Robinol, Morphine and Ativan. Respirations more labored. Daughters and sons at bedside crying. Precedex weaned to .6 mcg/ml. 1300Patients heart rate up to 1802. Patient essentially unresponsive. Oxygen saturations in the 80s. Patients eyes are closed. 1450 Heart rate remains in the 160-180s and comfort. Respiratory rate in upper 20s to 30s. Given prn for high heat rate. 1521 Patient died at 22. Prononced by Kathreen Cornfield and M.Tykeria Wawrzyniak Therapist, sports. 1650 Body to morgue.

## 2021-08-16 NOTE — Progress Notes (Signed)
I received a page from the nurse to provide spiritual support for the patient's family. I arrived at the patient's room with her daughters, son, and daughter-in-law present. I provided spiritual support through pastoral presence, by reading scripture, and by leading in prayer.    08/02/2021 1500  Clinical Encounter Type  Visited With Patient and family together  Visit Type Spiritual support;Death  Referral From Nurse  Consult/Referral To Chaplain  Spiritual Encounters  Spiritual Needs Sacred text;Prayer;Emotional;Grief support    Chaplain Dr Redgie Grayer

## 2021-08-16 DEATH — deceased
